# Patient Record
Sex: Male | Born: 1945
Health system: Southern US, Community
[De-identification: ages and names within clinical notes are randomized; demographics above are authoritative.]

## PROBLEM LIST (undated history)

## (undated) DIAGNOSIS — I480 Paroxysmal atrial fibrillation: Secondary | ICD-10-CM

## (undated) DIAGNOSIS — E291 Testicular hypofunction: Secondary | ICD-10-CM

## (undated) DIAGNOSIS — R350 Frequency of micturition: Secondary | ICD-10-CM

## (undated) DIAGNOSIS — I213 ST elevation (STEMI) myocardial infarction of unspecified site: Secondary | ICD-10-CM

## (undated) DIAGNOSIS — E785 Hyperlipidemia, unspecified: Secondary | ICD-10-CM

## (undated) DIAGNOSIS — IMO0001 Reserved for inherently not codable concepts without codable children: Secondary | ICD-10-CM

## (undated) DIAGNOSIS — N401 Enlarged prostate with lower urinary tract symptoms: Secondary | ICD-10-CM

## (undated) DIAGNOSIS — F419 Anxiety disorder, unspecified: Secondary | ICD-10-CM

## (undated) DIAGNOSIS — J449 Chronic obstructive pulmonary disease, unspecified: Secondary | ICD-10-CM

## (undated) DIAGNOSIS — J189 Pneumonia, unspecified organism: Secondary | ICD-10-CM

## (undated) DIAGNOSIS — K219 Gastro-esophageal reflux disease without esophagitis: Secondary | ICD-10-CM

## (undated) DIAGNOSIS — R7989 Other specified abnormal findings of blood chemistry: Secondary | ICD-10-CM

## (undated) DIAGNOSIS — I5021 Acute systolic (congestive) heart failure: Secondary | ICD-10-CM

## (undated) DIAGNOSIS — R911 Solitary pulmonary nodule: Secondary | ICD-10-CM

## (undated) DIAGNOSIS — M5430 Sciatica, unspecified side: Secondary | ICD-10-CM

## (undated) DIAGNOSIS — J869 Pyothorax without fistula: Secondary | ICD-10-CM

## (undated) DIAGNOSIS — I10 Essential (primary) hypertension: Secondary | ICD-10-CM

## (undated) HISTORY — DX: Benign prostatic hyperplasia with lower urinary tract symptoms: N40.1

## (undated) HISTORY — PX: BACK SURGERY: SHX140

## (undated) HISTORY — PX: HERNIA REPAIR: SHX51

## (undated) HISTORY — DX: Testicular hypofunction: E29.1

## (undated) HISTORY — DX: Chronic obstructive pulmonary disease, unspecified: J44.9

## (undated) HISTORY — DX: Sciatica, unspecified side: M54.30

## (undated) HISTORY — DX: Solitary pulmonary nodule: R91.1

## (undated) HISTORY — PX: VARICOSE VEIN SURGERY: SHX832

## (undated) HISTORY — PX: APPENDECTOMY: SHX54

## (undated) HISTORY — DX: Pyothorax without fistula: J86.9

---

## 2009-03-21 HISTORY — PX: COLONOSCOPY: SHX174

## 2011-06-13 DIAGNOSIS — H903 Sensorineural hearing loss, bilateral: Secondary | ICD-10-CM | POA: Diagnosis not present

## 2011-06-13 DIAGNOSIS — H905 Unspecified sensorineural hearing loss: Secondary | ICD-10-CM | POA: Diagnosis not present

## 2011-06-13 DIAGNOSIS — H938X9 Other specified disorders of ear, unspecified ear: Secondary | ICD-10-CM | POA: Diagnosis not present

## 2011-06-13 DIAGNOSIS — Z822 Family history of deafness and hearing loss: Secondary | ICD-10-CM | POA: Diagnosis not present

## 2011-06-13 DIAGNOSIS — H93299 Other abnormal auditory perceptions, unspecified ear: Secondary | ICD-10-CM | POA: Diagnosis not present

## 2011-06-28 DIAGNOSIS — H2510 Age-related nuclear cataract, unspecified eye: Secondary | ICD-10-CM | POA: Diagnosis not present

## 2011-07-06 DIAGNOSIS — H903 Sensorineural hearing loss, bilateral: Secondary | ICD-10-CM | POA: Diagnosis not present

## 2011-07-06 DIAGNOSIS — H905 Unspecified sensorineural hearing loss: Secondary | ICD-10-CM | POA: Diagnosis not present

## 2011-08-02 DIAGNOSIS — N529 Male erectile dysfunction, unspecified: Secondary | ICD-10-CM | POA: Diagnosis not present

## 2011-08-02 DIAGNOSIS — I10 Essential (primary) hypertension: Secondary | ICD-10-CM | POA: Diagnosis not present

## 2011-08-02 DIAGNOSIS — E291 Testicular hypofunction: Secondary | ICD-10-CM | POA: Diagnosis not present

## 2011-08-30 DIAGNOSIS — N529 Male erectile dysfunction, unspecified: Secondary | ICD-10-CM | POA: Diagnosis not present

## 2011-08-30 DIAGNOSIS — E291 Testicular hypofunction: Secondary | ICD-10-CM | POA: Diagnosis not present

## 2011-11-08 DIAGNOSIS — Z23 Encounter for immunization: Secondary | ICD-10-CM | POA: Diagnosis not present

## 2011-12-29 DIAGNOSIS — N529 Male erectile dysfunction, unspecified: Secondary | ICD-10-CM | POA: Diagnosis not present

## 2012-01-30 DIAGNOSIS — I10 Essential (primary) hypertension: Secondary | ICD-10-CM | POA: Diagnosis not present

## 2012-01-30 DIAGNOSIS — G4700 Insomnia, unspecified: Secondary | ICD-10-CM | POA: Diagnosis not present

## 2012-03-12 DIAGNOSIS — F411 Generalized anxiety disorder: Secondary | ICD-10-CM | POA: Diagnosis not present

## 2012-03-12 DIAGNOSIS — Z23 Encounter for immunization: Secondary | ICD-10-CM | POA: Diagnosis not present

## 2012-03-12 DIAGNOSIS — E291 Testicular hypofunction: Secondary | ICD-10-CM | POA: Diagnosis not present

## 2012-03-12 DIAGNOSIS — G4700 Insomnia, unspecified: Secondary | ICD-10-CM | POA: Diagnosis not present

## 2012-03-12 DIAGNOSIS — I10 Essential (primary) hypertension: Secondary | ICD-10-CM | POA: Diagnosis not present

## 2012-03-13 DIAGNOSIS — I10 Essential (primary) hypertension: Secondary | ICD-10-CM | POA: Diagnosis not present

## 2012-03-13 DIAGNOSIS — F411 Generalized anxiety disorder: Secondary | ICD-10-CM | POA: Diagnosis not present

## 2012-03-13 DIAGNOSIS — Z125 Encounter for screening for malignant neoplasm of prostate: Secondary | ICD-10-CM | POA: Diagnosis not present

## 2012-03-30 DIAGNOSIS — N529 Male erectile dysfunction, unspecified: Secondary | ICD-10-CM | POA: Diagnosis not present

## 2012-03-30 DIAGNOSIS — E291 Testicular hypofunction: Secondary | ICD-10-CM | POA: Diagnosis not present

## 2012-09-11 DIAGNOSIS — E291 Testicular hypofunction: Secondary | ICD-10-CM | POA: Diagnosis not present

## 2012-09-11 DIAGNOSIS — J449 Chronic obstructive pulmonary disease, unspecified: Secondary | ICD-10-CM | POA: Diagnosis not present

## 2012-09-11 DIAGNOSIS — I10 Essential (primary) hypertension: Secondary | ICD-10-CM | POA: Diagnosis not present

## 2012-11-07 DIAGNOSIS — H4011X0 Primary open-angle glaucoma, stage unspecified: Secondary | ICD-10-CM | POA: Diagnosis not present

## 2012-12-05 DIAGNOSIS — Z23 Encounter for immunization: Secondary | ICD-10-CM | POA: Diagnosis not present

## 2013-03-13 DIAGNOSIS — J4489 Other specified chronic obstructive pulmonary disease: Secondary | ICD-10-CM | POA: Diagnosis not present

## 2013-03-13 DIAGNOSIS — I10 Essential (primary) hypertension: Secondary | ICD-10-CM | POA: Diagnosis not present

## 2013-03-13 DIAGNOSIS — N401 Enlarged prostate with lower urinary tract symptoms: Secondary | ICD-10-CM | POA: Diagnosis not present

## 2013-03-13 DIAGNOSIS — N138 Other obstructive and reflux uropathy: Secondary | ICD-10-CM | POA: Diagnosis not present

## 2013-03-13 DIAGNOSIS — J449 Chronic obstructive pulmonary disease, unspecified: Secondary | ICD-10-CM | POA: Diagnosis not present

## 2013-03-26 DIAGNOSIS — E875 Hyperkalemia: Secondary | ICD-10-CM | POA: Diagnosis not present

## 2013-08-08 DIAGNOSIS — Z23 Encounter for immunization: Secondary | ICD-10-CM | POA: Diagnosis not present

## 2013-08-08 DIAGNOSIS — J449 Chronic obstructive pulmonary disease, unspecified: Secondary | ICD-10-CM | POA: Diagnosis not present

## 2013-08-08 DIAGNOSIS — I10 Essential (primary) hypertension: Secondary | ICD-10-CM | POA: Diagnosis not present

## 2013-08-08 DIAGNOSIS — E291 Testicular hypofunction: Secondary | ICD-10-CM | POA: Diagnosis not present

## 2013-08-23 DIAGNOSIS — N529 Male erectile dysfunction, unspecified: Secondary | ICD-10-CM | POA: Diagnosis not present

## 2013-11-16 DIAGNOSIS — Z23 Encounter for immunization: Secondary | ICD-10-CM | POA: Diagnosis not present

## 2014-01-28 DIAGNOSIS — L03115 Cellulitis of right lower limb: Secondary | ICD-10-CM | POA: Diagnosis not present

## 2014-02-06 DIAGNOSIS — J449 Chronic obstructive pulmonary disease, unspecified: Secondary | ICD-10-CM | POA: Diagnosis not present

## 2014-02-06 DIAGNOSIS — N401 Enlarged prostate with lower urinary tract symptoms: Secondary | ICD-10-CM | POA: Diagnosis not present

## 2014-02-06 DIAGNOSIS — I10 Essential (primary) hypertension: Secondary | ICD-10-CM | POA: Diagnosis not present

## 2014-02-06 DIAGNOSIS — L03115 Cellulitis of right lower limb: Secondary | ICD-10-CM | POA: Diagnosis not present

## 2014-02-18 DIAGNOSIS — B353 Tinea pedis: Secondary | ICD-10-CM | POA: Diagnosis not present

## 2014-02-18 DIAGNOSIS — B351 Tinea unguium: Secondary | ICD-10-CM | POA: Diagnosis not present

## 2014-02-18 DIAGNOSIS — M2012 Hallux valgus (acquired), left foot: Secondary | ICD-10-CM | POA: Diagnosis not present

## 2014-02-27 DIAGNOSIS — H5201 Hypermetropia, right eye: Secondary | ICD-10-CM | POA: Diagnosis not present

## 2014-02-27 DIAGNOSIS — H04129 Dry eye syndrome of unspecified lacrimal gland: Secondary | ICD-10-CM | POA: Diagnosis not present

## 2014-02-27 DIAGNOSIS — H52223 Regular astigmatism, bilateral: Secondary | ICD-10-CM | POA: Diagnosis not present

## 2014-02-27 DIAGNOSIS — H524 Presbyopia: Secondary | ICD-10-CM | POA: Diagnosis not present

## 2014-03-19 DIAGNOSIS — I10 Essential (primary) hypertension: Secondary | ICD-10-CM | POA: Diagnosis not present

## 2014-03-19 DIAGNOSIS — N401 Enlarged prostate with lower urinary tract symptoms: Secondary | ICD-10-CM | POA: Diagnosis not present

## 2014-03-19 DIAGNOSIS — J449 Chronic obstructive pulmonary disease, unspecified: Secondary | ICD-10-CM | POA: Diagnosis not present

## 2014-03-21 HISTORY — PX: LUNG SURGERY: SHX703

## 2014-05-08 DIAGNOSIS — J449 Chronic obstructive pulmonary disease, unspecified: Secondary | ICD-10-CM | POA: Diagnosis not present

## 2014-05-08 DIAGNOSIS — N401 Enlarged prostate with lower urinary tract symptoms: Secondary | ICD-10-CM | POA: Diagnosis not present

## 2014-05-08 DIAGNOSIS — I10 Essential (primary) hypertension: Secondary | ICD-10-CM | POA: Diagnosis not present

## 2014-05-15 DIAGNOSIS — R413 Other amnesia: Secondary | ICD-10-CM | POA: Diagnosis not present

## 2014-05-15 LAB — VITAMIN B12: Vitamin B-12: 515

## 2014-05-20 DIAGNOSIS — M2012 Hallux valgus (acquired), left foot: Secondary | ICD-10-CM | POA: Diagnosis not present

## 2014-05-20 DIAGNOSIS — J189 Pneumonia, unspecified organism: Secondary | ICD-10-CM

## 2014-05-20 DIAGNOSIS — B353 Tinea pedis: Secondary | ICD-10-CM | POA: Diagnosis not present

## 2014-05-20 DIAGNOSIS — B351 Tinea unguium: Secondary | ICD-10-CM | POA: Diagnosis not present

## 2014-05-20 HISTORY — DX: Pneumonia, unspecified organism: J18.9

## 2014-05-29 ENCOUNTER — Telehealth: Payer: Self-pay

## 2014-05-29 NOTE — Telephone Encounter (Signed)
PATIENT WIFE, PATRICIA Neubecker,  CALLED TO SCHEDULE HUSBANDS COLONOSCOPY, PLEASE CALL [phone]  LEAVE MESSAGE IF NO ANSWER.  WIFE WILL ALSO NEED TO BE SCHEDULED FOR ONE.

## 2014-06-04 NOTE — Telephone Encounter (Signed)
Per pt's info from Dr. Derald Macleod, his last colonoscopy was done 02/18/2010 and next due in 5 years. He had tubular adenoma and will need OV. However, wife said he was hoping to do in May. She will have him check with Medicare, because they might not pay if he is not due til 02/2015.

## 2014-06-07 ENCOUNTER — Emergency Department (HOSPITAL_COMMUNITY): Payer: Medicare Other

## 2014-06-07 ENCOUNTER — Encounter (HOSPITAL_COMMUNITY): Payer: Self-pay | Admitting: Emergency Medicine

## 2014-06-07 ENCOUNTER — Inpatient Hospital Stay (HOSPITAL_COMMUNITY)
Admission: EM | Admit: 2014-06-07 | Discharge: 2014-06-10 | DRG: 193 | Disposition: A | Discharge status: Home / Self Care | Payer: Medicare Other | Attending: Pulmonary Disease | Admitting: Pulmonary Disease

## 2014-06-07 DIAGNOSIS — R109 Unspecified abdominal pain: Secondary | ICD-10-CM | POA: Diagnosis not present

## 2014-06-07 DIAGNOSIS — R110 Nausea: Secondary | ICD-10-CM | POA: Diagnosis not present

## 2014-06-07 DIAGNOSIS — J189 Pneumonia, unspecified organism: Secondary | ICD-10-CM | POA: Diagnosis not present

## 2014-06-07 DIAGNOSIS — I10 Essential (primary) hypertension: Secondary | ICD-10-CM | POA: Diagnosis present

## 2014-06-07 DIAGNOSIS — Z7982 Long term (current) use of aspirin: Secondary | ICD-10-CM | POA: Diagnosis not present

## 2014-06-07 DIAGNOSIS — N400 Enlarged prostate without lower urinary tract symptoms: Secondary | ICD-10-CM | POA: Diagnosis present

## 2014-06-07 DIAGNOSIS — Z8249 Family history of ischemic heart disease and other diseases of the circulatory system: Secondary | ICD-10-CM | POA: Diagnosis not present

## 2014-06-07 DIAGNOSIS — R6510 Systemic inflammatory response syndrome (SIRS) of non-infectious origin without acute organ dysfunction: Secondary | ICD-10-CM

## 2014-06-07 DIAGNOSIS — E860 Dehydration: Secondary | ICD-10-CM | POA: Diagnosis present

## 2014-06-07 DIAGNOSIS — F17290 Nicotine dependence, other tobacco product, uncomplicated: Secondary | ICD-10-CM | POA: Diagnosis present

## 2014-06-07 DIAGNOSIS — N179 Acute kidney failure, unspecified: Secondary | ICD-10-CM | POA: Diagnosis not present

## 2014-06-07 DIAGNOSIS — R0602 Shortness of breath: Secondary | ICD-10-CM | POA: Diagnosis not present

## 2014-06-07 DIAGNOSIS — J9601 Acute respiratory failure with hypoxia: Secondary | ICD-10-CM

## 2014-06-07 DIAGNOSIS — A419 Sepsis, unspecified organism: Secondary | ICD-10-CM | POA: Diagnosis not present

## 2014-06-07 DIAGNOSIS — Z66 Do not resuscitate: Secondary | ICD-10-CM | POA: Diagnosis present

## 2014-06-07 DIAGNOSIS — Z825 Family history of asthma and other chronic lower respiratory diseases: Secondary | ICD-10-CM | POA: Diagnosis not present

## 2014-06-07 DIAGNOSIS — R112 Nausea with vomiting, unspecified: Secondary | ICD-10-CM | POA: Diagnosis not present

## 2014-06-07 DIAGNOSIS — J449 Chronic obstructive pulmonary disease, unspecified: Secondary | ICD-10-CM | POA: Diagnosis present

## 2014-06-07 DIAGNOSIS — M549 Dorsalgia, unspecified: Secondary | ICD-10-CM | POA: Diagnosis not present

## 2014-06-07 DIAGNOSIS — J90 Pleural effusion, not elsewhere classified: Secondary | ICD-10-CM | POA: Diagnosis not present

## 2014-06-07 HISTORY — DX: Other specified abnormal findings of blood chemistry: R79.89

## 2014-06-07 HISTORY — DX: Essential (primary) hypertension: I10

## 2014-06-07 HISTORY — DX: Frequency of micturition: R35.0

## 2014-06-07 LAB — COMPREHENSIVE METABOLIC PANEL
ALK PHOS: 43 U/L (ref 39–117)
ALT: 14 U/L (ref 0–53)
ANION GAP: 9 (ref 5–15)
AST: 13 U/L (ref 0–37)
Albumin: 3.9 g/dL (ref 3.5–5.2)
BUN: 28 mg/dL — ABNORMAL HIGH (ref 6–23)
CALCIUM: 8.7 mg/dL (ref 8.4–10.5)
CO2: 26 mmol/L (ref 19–32)
CREATININE: 1.4 mg/dL — AB (ref 0.50–1.35)
Chloride: 103 mmol/L (ref 96–112)
GFR calc Af Amer: 58 mL/min — ABNORMAL LOW (ref >90)
GFR, EST NON AFRICAN AMERICAN: 50 mL/min — AB (ref >90)
Glucose, Bld: 114 mg/dL — ABNORMAL HIGH (ref 70–99)
Potassium: 4.3 mmol/L (ref 3.5–5.1)
Sodium: 138 mmol/L (ref 135–145)
Total Bilirubin: 0.8 mg/dL (ref 0.3–1.2)
Total Protein: 7.1 g/dL (ref 6.0–8.3)

## 2014-06-07 LAB — CBC WITH DIFFERENTIAL/PLATELET
Basophils Absolute: 0 10*3/uL (ref 0.0–0.1)
Basophils Relative: 0 % (ref 0–1)
EOS ABS: 0 10*3/uL (ref 0.0–0.7)
Eosinophils Relative: 0 % (ref 0–5)
HCT: 42.5 % (ref 39.0–52.0)
Hemoglobin: 14.5 g/dL (ref 13.0–17.0)
LYMPHS PCT: 4 % — AB (ref 12–46)
Lymphs Abs: 0.6 10*3/uL — ABNORMAL LOW (ref 0.7–4.0)
MCH: 32.2 pg (ref 26.0–34.0)
MCHC: 34.1 g/dL (ref 30.0–36.0)
MCV: 94.4 fL (ref 78.0–100.0)
MONOS PCT: 8 % (ref 3–12)
Monocytes Absolute: 1.4 10*3/uL — ABNORMAL HIGH (ref 0.1–1.0)
Neutro Abs: 14.5 10*3/uL — ABNORMAL HIGH (ref 1.7–7.7)
Neutrophils Relative %: 88 % — ABNORMAL HIGH (ref 43–77)
Platelets: 228 10*3/uL (ref 150–400)
RBC: 4.5 MIL/uL (ref 4.22–5.81)
RDW: 13 % (ref 11.5–15.5)
WBC: 16.6 10*3/uL — AB (ref 4.0–10.5)

## 2014-06-07 LAB — TROPONIN I: Troponin I: <0.03 ng/mL (ref <0.031)

## 2014-06-07 LAB — URINALYSIS, ROUTINE W REFLEX MICROSCOPIC
BILIRUBIN URINE: NEGATIVE
Glucose, UA: NEGATIVE mg/dL
Hgb urine dipstick: NEGATIVE
Leukocytes, UA: NEGATIVE
Nitrite: NEGATIVE
PROTEIN: NEGATIVE mg/dL
Specific Gravity, Urine: 1.025 (ref 1.005–1.030)
Urobilinogen, UA: 0.2 mg/dL (ref 0.0–1.0)
pH: 5.5 (ref 5.0–8.0)

## 2014-06-07 LAB — CBG MONITORING, ED: Glucose-Capillary: 114 mg/dL — ABNORMAL HIGH (ref 70–99)

## 2014-06-07 LAB — I-STAT CG4 LACTIC ACID, ED
LACTIC ACID, VENOUS: 0.76 mmol/L (ref 0.5–2.0)
LACTIC ACID, VENOUS: 0.94 mmol/L (ref 0.5–2.0)

## 2014-06-07 LAB — LIPASE, BLOOD: LIPASE: 23 U/L (ref 11–59)

## 2014-06-07 MED ORDER — TESTOSTERONE 50 MG/5GM (1%) TD GEL
5.0000 g | Freq: Every day | TRANSDERMAL | Status: DC
  Administered 2014-06-09: 5 g via TRANSDERMAL

## 2014-06-07 MED ORDER — IOHEXOL 300 MG/ML  SOLN
50.0000 mL | Freq: Once | INTRAMUSCULAR | Status: AC | PRN
  Administered 2014-06-07: 50 mL via ORAL

## 2014-06-07 MED ORDER — LISINOPRIL 10 MG PO TABS
20.0000 mg | ORAL_TABLET | Freq: Every day | ORAL | Status: DC
  Administered 2014-06-08 – 2014-06-10 (×3): 20 mg via ORAL
  Filled 2014-06-07 (×3): qty 2

## 2014-06-07 MED ORDER — HYDROMORPHONE HCL 1 MG/ML IJ SOLN: 1.0000 mg | INTRAMUSCULAR | Status: DC | PRN

## 2014-06-07 MED ORDER — SODIUM CHLORIDE 0.9 % IV SOLN
INTRAVENOUS | Status: DC
  Administered 2014-06-07: 125 mL/h via INTRAVENOUS
  Administered 2014-06-08 – 2014-06-10 (×5): via INTRAVENOUS

## 2014-06-07 MED ORDER — IOHEXOL 300 MG/ML  SOLN
100.0000 mL | Freq: Once | INTRAMUSCULAR | Status: AC | PRN
  Administered 2014-06-07: 100 mL via INTRAVENOUS

## 2014-06-07 MED ORDER — ONDANSETRON HCL 4 MG/2ML IJ SOLN: 4.0000 mg | Freq: Four times a day (QID) | INTRAMUSCULAR | Status: AC | PRN

## 2014-06-07 MED ORDER — HYDROMORPHONE HCL 1 MG/ML IJ SOLN
0.5000 mg | INTRAMUSCULAR | Status: DC | PRN
  Administered 2014-06-07: 0.5 mg via INTRAVENOUS
  Filled 2014-06-07: qty 1

## 2014-06-07 MED ORDER — ASPIRIN EC 81 MG PO TBEC
81.0000 mg | DELAYED_RELEASE_TABLET | Freq: Every day | ORAL | Status: DC
  Administered 2014-06-08 – 2014-06-10 (×3): 81 mg via ORAL
  Filled 2014-06-07 (×3): qty 1

## 2014-06-07 MED ORDER — PIPERACILLIN-TAZOBACTAM 3.375 G IVPB 30 MIN
3.3750 g | Freq: Once | INTRAVENOUS | Status: AC
  Administered 2014-06-07: 3.375 g via INTRAVENOUS
  Filled 2014-06-07: qty 50

## 2014-06-07 MED ORDER — LEVOFLOXACIN IN D5W 750 MG/150ML IV SOLN
750.0000 mg | INTRAVENOUS | Status: DC
  Administered 2014-06-07 – 2014-06-09 (×3): 750 mg via INTRAVENOUS
  Filled 2014-06-07 (×3): qty 150

## 2014-06-07 MED ORDER — SODIUM CHLORIDE 0.9 % IV BOLUS (SEPSIS)
1000.0000 mL | Freq: Once | INTRAVENOUS | Status: AC
  Administered 2014-06-07: 1000 mL via INTRAVENOUS

## 2014-06-07 MED ORDER — OXYCODONE HCL 5 MG PO TABS
5.0000 mg | ORAL_TABLET | ORAL | Status: DC | PRN
  Administered 2014-06-07 – 2014-06-08 (×2): 5 mg via ORAL
  Filled 2014-06-07 (×2): qty 1

## 2014-06-07 MED ORDER — TAMSULOSIN HCL 0.4 MG PO CAPS
0.4000 mg | ORAL_CAPSULE | Freq: Every day | ORAL | Status: DC
  Administered 2014-06-08 – 2014-06-10 (×3): 0.4 mg via ORAL
  Filled 2014-06-07 (×3): qty 1

## 2014-06-07 MED ORDER — ENOXAPARIN SODIUM 40 MG/0.4ML ~~LOC~~ SOLN
40.0000 mg | SUBCUTANEOUS | Status: DC
  Administered 2014-06-07 – 2014-06-09 (×3): 40 mg via SUBCUTANEOUS
  Filled 2014-06-07 (×3): qty 0.4

## 2014-06-07 MED ORDER — METHYLPREDNISOLONE SODIUM SUCC 125 MG IJ SOLR
60.0000 mg | Freq: Every day | INTRAMUSCULAR | Status: DC
  Administered 2014-06-07 – 2014-06-10 (×4): 60 mg via INTRAVENOUS
  Filled 2014-06-07 (×4): qty 2

## 2014-06-07 MED ORDER — AMLODIPINE BESYLATE 5 MG PO TABS
2.5000 mg | ORAL_TABLET | Freq: Every day | ORAL | Status: DC
  Administered 2014-06-08 – 2014-06-10 (×3): 2.5 mg via ORAL
  Filled 2014-06-07 (×3): qty 1

## 2014-06-07 NOTE — H&P (Signed)
History and Physical  Cru Kritikos NWG:[id number] DOB: Jul 14, 1945 DOA: 06/07/2014  Referring physician: Dr. Sabra Heck, ED physician PCP: Alonza Bogus, MD   Chief Complaint: Right flank pain  HPI: [name] [name] is a 69 y.o. male  With a history of hypertension, BPH, low serum testosterone level, mild COPD. Patient presented to the onset of right flank pain that started 4-5 days ago and has gradually increased. The patient had sudden worsening of his pain this afternoon. His pain had become sharp, 8 out of 10, and radiated into his right shoulder. The patient's wife called EMS the patient was transported here. The patient has been feeling feverish but no oral temperatures have been obtained. No provoking or palliating factors. Shortness of breath worse with exertion.   Review of Systems:   Pt complains of pleurisy, back spasms, shortness of breath, cough.  Pt denies any chills, and nausea, vomiting, anorexia, chest pain, palpitations, wheezing, diarrhea, constipation.  Review of systems are otherwise negative  Past Medical History  Diagnosis Date  . Hypertension   . Urinary frequency   . Low serum testosterone level    Past Surgical History  Procedure Laterality Date  . Hernia repair    . Appendectomy     Social History:  reports that he has been smoking Pipe.  He does not have any smokeless tobacco history on file. He reports that he drinks alcohol. He reports that he does not use illicit drugs. Patient lives at home & is able to participate in activities of daily living  No Known Allergies  Family History  Problem Relation Age of Onset  . Tuberculosis Father   . Hypertension Father   . COPD Mother   . Hypertension Mother      Prior to Admission medications   Medication Sig Start Date End Date Taking? Authorizing Provider  amLODipine (NORVASC) 2.5 MG tablet Take 1 tablet by mouth daily. 05/20/14  Yes Historical Provider, MD  aspirin EC 81 MG tablet Take 81 mg by mouth  daily.   Yes Historical Provider, MD  Calcium Carbonate-Vitamin D (CALCIUM + D PO) Take 1 tablet by mouth daily.   Yes Historical Provider, MD  Glucosamine-Chondroitin-Vit D3 1500-1200-800 MG-MG-UNIT PACK Take 1 tablet by mouth daily.   Yes Historical Provider, MD  Lactobacillus (ACIDOPHILUS PROBIOTIC) 10 MG TABS Take 1 tablet by mouth daily.   Yes Historical Provider, MD  lisinopril (PRINIVIL,ZESTRIL) 20 MG tablet Take 1 tablet by mouth daily. 05/20/14  Yes Historical Provider, MD  Multiple Vitamin (MULTIVITAMIN WITH MINERALS) TABS tablet Take 1 tablet by mouth daily.   Yes Historical Provider, MD  Multiple Vitamins-Minerals (PRESERVISION AREDS 2 PO) Take 2 tablets by mouth daily.   Yes Historical Provider, MD  Omega Fatty Acids-Phytosterols (PA FISH OIL/PHYTOSTEROLS) 450-400 MG CAPS Take 1 capsule by mouth daily.   Yes Historical Provider, MD  Omega-3 Fatty Acids (FISH OIL) 1200 MG CAPS Take 1 capsule by mouth daily.   Yes Historical Provider, MD  tamsulosin (FLOMAX) 0.4 MG CAPS capsule Take 1 capsule by mouth daily. 05/30/14  Yes Historical Provider, MD  testosterone (ANDROGEL) 50 MG/5GM (1%) GEL Place 5 g onto the skin daily. 05/21/14  Yes Historical Provider, MD    Physical Exam: BP 134/84 mmHg  Pulse 98  Temp(Src) 99.7 F (37.6 C) (Oral)  Resp 26  Ht 5\' 11"  (1.803 m)  Wt 88.451 kg (195 lb)  BMI 27.21 kg/m2  SpO2 94%  General: Elderly Caucasian male. Awake and alert and oriented x3. No  acute cardiopulmonary distress.  Eyes: Pupils equal, round, reactive to light. Extraocular muscles are intact. Sclerae anicteric and noninjected.  ENT:  Dry mucosal membranes. No mucosal lesions.  Neck: Neck supple without lymphadenopathy. No carotid bruits. No masses palpated.  Cardiovascular: Regular rate with normal S1-S2 sounds. No murmurs, rubs, gallops auscultated. No JVD.  Respiratory: Good respiratory effort with no wheezes. Rales in the right mid to lower lung fields. Egophony present. Abdomen:  Soft, nontender, nondistended. Active bowel sounds. No masses or hepatosplenomegaly  Skin: Dry, warm to touch. 2+ dorsalis pedis and radial pulses. Musculoskeletal: No calf or leg pain. All major joints not erythematous nontender.  Psychiatric: Intact judgment and insight.  Neurologic: No focal neurological deficits. Cranial nerves II through XII are grossly intact.           Labs on Admission:  Basic Metabolic Panel:  Recent Labs Lab 06/07/14 1805  NA 138  K 4.3  CL 103  CO2 26  GLUCOSE 114*  BUN 28*  CREATININE 1.40*  CALCIUM 8.7   Liver Function Tests:  Recent Labs Lab 06/07/14 1805  AST 13  ALT 14  ALKPHOS 43  BILITOT 0.8  PROT 7.1  ALBUMIN 3.9    Recent Labs Lab 06/07/14 1805  LIPASE 23   No results for input(s): AMMONIA in the last 168 hours. CBC:  Recent Labs Lab 06/07/14 1805  WBC 16.6*  NEUTROABS 14.5*  HGB 14.5  HCT 42.5  MCV 94.4  PLT 228   Cardiac Enzymes:  Recent Labs Lab 06/07/14 1805  TROPONINI <0.03    BNP (last 3 results) No results for input(s): BNP in the last 8760 hours.  ProBNP (last 3 results) No results for input(s): PROBNP in the last 8760 hours.  CBG:  Recent Labs Lab 06/07/14 1715  GLUCAP 114*    Radiological Exams on Admission: Ct Abdomen Pelvis W Contrast  06/07/2014   CLINICAL DATA:  Right flank pain, generalized body aches, fever, shortness of breath  EXAM: CT ABDOMEN AND PELVIS WITH CONTRAST  TECHNIQUE: Multidetector CT imaging of the abdomen and pelvis was performed using the standard protocol following bolus administration of intravenous contrast.  CONTRAST:  158mL OMNIPAQUE IOHEXOL 300 MG/ML  SOLN  COMPARISON:  None.  FINDINGS: Lower chest: Patchy right middle and lower lobe opacities, suspicious for pneumonia. Associated small right pleural effusion.  Hepatobiliary: 11 mm probable cyst in the medial segment left hepatic lobe (series 2/ image 23).  Gallbladder is unremarkable. No intrahepatic or  extrahepatic ductal dilatation.  Pancreas: Within normal limits.  Spleen: Within normal limits.  Adrenals/Urinary Tract: Adrenal glands are within normal limits.  16 mm cyst in the lateral interpolar left kidney (series 2/ image 29). Right kidney is within normal limits. No hydronephrosis.  Bladder is within normal limits.  Stomach/Bowel: Stomach is within normal limits.  No evidence of bowel obstruction.  Prior appendectomy.  Mild colonic diverticulosis, without evidence of diverticulitis.  Vascular/Lymphatic: Atherosclerotic calcifications of the abdominal aorta and branch vessels.  No suspicious abdominopelvic lymphadenopathy.  Reproductive: Prostate is notable for dystrophic calcifications.  Other: No abdominopelvic ascites.  Postsurgical changes related to prior right inguinal hernia repair.  Musculoskeletal: Degenerative changes of the visualized thoracolumbar spine.  IMPRESSION: Patchy right middle and lower lobe opacities, suspicious for pneumonia. Associated small right pleural effusion.  No evidence of bowel obstruction.  Prior appendectomy.   Electronically Signed   By: Julian Hy M.D.   On: 06/07/2014 20:23   Dg Chest Port 1 View  06/07/2014  CLINICAL DATA:  PATIENT ARRIEVED TO ER BY EMS, RIGHT SIDE/FLANK PAIN STARTED TODAY AND SOB TODAY. BODY ACHES X 1 WEEK. LEFT SHOULDER PAIN, NAUSEA YESTERDAY. PATIENT STATES " HURTS TO BREATHE" STATES " HE SMOKES A PIPE" HISTORY OF HTN  EXAM: PORTABLE CHEST - 1 VIEW  COMPARISON:  None  FINDINGS: There is basilar opacity, most evident on right, which may all be atelectasis. Pneumonia is possible. There is no evidence of pulmonary edema. No pleural effusion or pneumothorax.  Cardiac silhouette is normal in size. No mediastinal or hilar masses. Bony thorax is demineralized but grossly intact.  IMPRESSION: Right greater than left lung base opacity. Atelectasis/scarring suspected. Infiltrate is possible. No pulmonary edema.   Electronically Signed   By: Lajean Manes M.D.   On: 06/07/2014 18:18    EKG: Independently reviewed. Sinus rhythm. Normal intervals. Inverted P wave in V1 suggestive of left atrial hypertrophy. No ST changes  Assessment/Plan Present on Admission:  . CAP (community acquired pneumonia) . Acute respiratory failure with hypoxia . Acute renal injury . Dehydration  #1 acute respiratory failure with hypoxia #2 community-acquired pneumonia #3 acute renal injury #4 dehydration #5 hypertension #6 BPH  Admit the patient to MedSurg to Dr. Doristine Section change the patient to Levaquin We'll start the patient on Solu-Medrol as there is evidence supporting the use of glucocorticoids in hospitalized community-acquired pneumonia patient's Continue IV fluids for rehydration and acute renal injury secondary to dehydration Continue home medications  DVT prophylaxis: Lovenox  Consultants: None  Code Status: DO NOT RESUSCITATE  Family Communication: Wife in the room   Disposition Plan: Home following improvement   Truett Mainland, DO [facility] Pager [phone]

## 2014-06-07 NOTE — ED Notes (Signed)
Patient arrives via EMS from home with c/o right side/flank pain that started today. Wife states patient c/o generalized aching and body aches x 1 week. Patient states left shoulder pain yesterday. + Nausea. Patient given 4 mg zofran and 50 mcg of Fentanyl in route to ED.

## 2014-06-07 NOTE — Progress Notes (Signed)
ANTIBIOTIC CONSULT NOTE - INITIAL  Pharmacy Consult for Renal Adjustment Antibiotics (Levaquin) Indication: pneumonia  No Known Allergies  Patient Measurements: Height: 5\' 11"  (180.3 cm) Weight: 195 lb (88.451 kg) IBW/kg (Calculated) : 75.3 Adjusted Body Weight:   Vital Signs: Temp: 99.7 F (37.6 C) (03/19 1711) Temp Source: Oral (03/19 1711) BP: 134/84 mmHg (03/19 2100) Pulse Rate: 98 (03/19 2030) Intake/Output from previous day:   Intake/Output from this shift:    Labs:  Recent Labs  06/07/14 1805  WBC 16.6*  HGB 14.5  PLT 228  CREATININE 1.40*   Estimated Creatinine Clearance: 53 mL/min (by C-G formula based on Cr of 1.4). No results for input(s): VANCOTROUGH, VANCOPEAK, VANCORANDOM, GENTTROUGH, GENTPEAK, GENTRANDOM, TOBRATROUGH, TOBRAPEAK, TOBRARND, AMIKACINPEAK, AMIKACINTROU, AMIKACIN in the last 72 hours.   Microbiology: No results found for this or any previous visit (from the past 720 hour(s)).  Medical History: Past Medical History  Diagnosis Date  . Hypertension   . Urinary frequency   . Low serum testosterone level     Medications:   (Not in a hospital admission) Assessment: ED patient admitted for CAP Levaquin 750 mg IV every 24 hours on admission CrCl 53 ml/min  Goal of Therapy:  Eradicate infection  Plan:  Continue Levaquin 750 mg IV every 24 hours Monitor renal function F/U oral therapy when appropriate  [name] [name], [name] [name] 06/07/2014,9:31 PM

## 2014-06-07 NOTE — ED Provider Notes (Signed)
CSN: [id number]     Arrival date & time 06/07/14  1651 History   First MD Initiated Contact with Patient 06/07/14 1707     Chief Complaint  Patient presents with  . Flank Pain     (Consider location/radiation/quality/duration/timing/severity/associated sxs/prior Treatment) HPI Comments: The patient is a 69 year old male, he has a history of hypertension, appendectomy, hernia repair, presents with a complaint of pain which is currently in his right side including his abdomen chest shoulder and neck, had had previous left-sided aching approximately 2 days ago, this has been progressive, gradually worsening and is now severe. He called paramedics, they found him doubled over on the bed in severe pain, gave fentanyl with significant amount of relief. They found him to be tachycardic but otherwise had normal blood pressure is not slightly hypertensive. The patient denies nausea or vomiting, he has had 2 meals today neither of which made his pain worse, he has no urinary symptoms other than decreased urinary flow today. No diarrhea, no swelling, no sore throat, no headache.  Patient is a 69 y.o. male presenting with flank pain. The history is provided by the patient.  Flank Pain    Past Medical History  Diagnosis Date  . Hypertension   . Urinary frequency   . Low serum testosterone level    Past Surgical History  Procedure Laterality Date  . Hernia repair    . Appendectomy     No family history on file. History  Substance Use Topics  . Smoking status: Current Some Day Smoker    Types: Cigars  . Smokeless tobacco: Not on file  . Alcohol Use: Yes     Comment: occasionally    Review of Systems  Genitourinary: Positive for flank pain.  All other systems reviewed and are negative.     Allergies  Review of patient's allergies indicates no known allergies.  Home Medications   Prior to Admission medications   Medication Sig Start Date End Date Taking? Authorizing Provider   amLODipine (NORVASC) 2.5 MG tablet Take 1 tablet by mouth daily. 05/20/14  Yes Historical Provider, MD  aspirin 325 MG tablet Take 650 mg by mouth daily as needed for moderate pain.   Yes Historical Provider, MD  aspirin EC 81 MG tablet Take 81 mg by mouth daily.   Yes Historical Provider, MD  Calcium Carbonate-Vitamin D (CALCIUM + D PO) Take 1 tablet by mouth daily.   Yes Historical Provider, MD  Glucosamine-Chondroitin-Vit D3 1500-1200-800 MG-MG-UNIT PACK Take 1 tablet by mouth daily.   Yes Historical Provider, MD  Lactobacillus (ACIDOPHILUS PROBIOTIC) 10 MG TABS Take 1 tablet by mouth daily.   Yes Historical Provider, MD  lisinopril (PRINIVIL,ZESTRIL) 20 MG tablet Take 1 tablet by mouth daily. 05/20/14  Yes Historical Provider, MD  Multiple Vitamin (MULTIVITAMIN WITH MINERALS) TABS tablet Take 1 tablet by mouth daily.   Yes Historical Provider, MD  Multiple Vitamins-Minerals (PRESERVISION AREDS 2 PO) Take 2 tablets by mouth daily.   Yes Historical Provider, MD  Omega Fatty Acids-Phytosterols (PA FISH OIL/PHYTOSTEROLS) 450-400 MG CAPS Take 1 capsule by mouth daily.   Yes Historical Provider, MD  Omega-3 Fatty Acids (FISH OIL) 1200 MG CAPS Take 1 capsule by mouth daily.   Yes Historical Provider, MD  tamsulosin (FLOMAX) 0.4 MG CAPS capsule Take 1 capsule by mouth daily. 05/30/14  Yes Historical Provider, MD  testosterone (ANDROGEL) 50 MG/5GM (1%) GEL Place 5 g onto the skin daily. 05/21/14  Yes Historical Provider, MD   BP 133/66  mmHg  Pulse 101  Temp(Src) 99.7 F (37.6 C) (Oral)  Resp 18  Ht 5\' 11"  (1.803 m)  Wt 195 lb (88.451 kg)  BMI 27.21 kg/m2  SpO2 87% Physical Exam  Constitutional: He appears well-developed and well-nourished. No distress.  HENT:  Head: Normocephalic and atraumatic.  Mouth/Throat: Oropharynx is clear and moist. No oropharyngeal exudate.  Eyes: Conjunctivae and EOM are normal. Pupils are equal, round, and reactive to light. Right eye exhibits no discharge. Left eye  exhibits no discharge. No scleral icterus.  Neck: Normal range of motion. Neck supple. No JVD present. No thyromegaly present.  Cardiovascular: Regular rhythm, normal heart sounds and intact distal pulses.  Exam reveals no gallop and no friction rub.   No murmur heard. Tachycardic to 105 bpm  Pulmonary/Chest: Effort normal and breath sounds normal. No respiratory distress. He has no wheezes. He has no rales.  Mild tachypnea present  Abdominal: Soft. Bowel sounds are normal. He exhibits no distension and no mass. There is tenderness. There is guarding.  No left-sided abdominal tenderness, minimal epigastric tenderness, significant right upper quadrant tenderness with guarding and positive Murphy sign, minimal tenderness right lower quadrant  Musculoskeletal: Normal range of motion. He exhibits no edema or tenderness.  Lymphadenopathy:    He has no cervical adenopathy.  Neurological: He is alert. Coordination normal.  Skin: Skin is warm and dry. No rash noted. No erythema.  Psychiatric: He has a normal mood and affect. His behavior is normal.  Nursing note and vitals reviewed.   ED Course  Procedures (including critical care time) Labs Review Labs Reviewed  COMPREHENSIVE METABOLIC PANEL - Abnormal; Notable for the following:    Glucose, Bld 114 (*)    BUN 28 (*)    Creatinine, Ser 1.40 (*)    GFR calc non Af Amer 50 (*)    GFR calc Af Amer 58 (*)    All other components within normal limits  CBC WITH DIFFERENTIAL/PLATELET - Abnormal; Notable for the following:    WBC 16.6 (*)    Neutrophils Relative % 88 (*)    Neutro Abs 14.5 (*)    Lymphocytes Relative 4 (*)    Lymphs Abs 0.6 (*)    Monocytes Absolute 1.4 (*)    All other components within normal limits  URINALYSIS, ROUTINE W REFLEX MICROSCOPIC - Abnormal; Notable for the following:    Ketones, ur TRACE (*)    All other components within normal limits  CBG MONITORING, ED - Abnormal; Notable for the following:     Glucose-Capillary 114 (*)    All other components within normal limits  LIPASE, BLOOD  TROPONIN I  I-STAT CG4 LACTIC ACID, ED  I-STAT CG4 LACTIC ACID, ED    Imaging Review Ct Abdomen Pelvis W Contrast  06/07/2014   CLINICAL DATA:  Right flank pain, generalized body aches, fever, shortness of breath  EXAM: CT ABDOMEN AND PELVIS WITH CONTRAST  TECHNIQUE: Multidetector CT imaging of the abdomen and pelvis was performed using the standard protocol following bolus administration of intravenous contrast.  CONTRAST:  172mL OMNIPAQUE IOHEXOL 300 MG/ML  SOLN  COMPARISON:  None.  FINDINGS: Lower chest: Patchy right middle and lower lobe opacities, suspicious for pneumonia. Associated small right pleural effusion.  Hepatobiliary: 11 mm probable cyst in the medial segment left hepatic lobe (series 2/ image 23).  Gallbladder is unremarkable. No intrahepatic or extrahepatic ductal dilatation.  Pancreas: Within normal limits.  Spleen: Within normal limits.  Adrenals/Urinary Tract: Adrenal glands are within normal  limits.  16 mm cyst in the lateral interpolar left kidney (series 2/ image 29). Right kidney is within normal limits. No hydronephrosis.  Bladder is within normal limits.  Stomach/Bowel: Stomach is within normal limits.  No evidence of bowel obstruction.  Prior appendectomy.  Mild colonic diverticulosis, without evidence of diverticulitis.  Vascular/Lymphatic: Atherosclerotic calcifications of the abdominal aorta and branch vessels.  No suspicious abdominopelvic lymphadenopathy.  Reproductive: Prostate is notable for dystrophic calcifications.  Other: No abdominopelvic ascites.  Postsurgical changes related to prior right inguinal hernia repair.  Musculoskeletal: Degenerative changes of the visualized thoracolumbar spine.  IMPRESSION: Patchy right middle and lower lobe opacities, suspicious for pneumonia. Associated small right pleural effusion.  No evidence of bowel obstruction.  Prior appendectomy.    Electronically Signed   By: Julian Hy M.D.   On: 06/07/2014 20:23   Dg Chest Port 1 View  06/07/2014   CLINICAL DATA:  PATIENT ARRIEVED TO ER BY EMS, RIGHT SIDE/FLANK PAIN STARTED TODAY AND SOB TODAY. BODY ACHES X 1 WEEK. LEFT SHOULDER PAIN, NAUSEA YESTERDAY. PATIENT STATES " HURTS TO BREATHE" STATES " HE SMOKES A PIPE" HISTORY OF HTN  EXAM: PORTABLE CHEST - 1 VIEW  COMPARISON:  None  FINDINGS: There is basilar opacity, most evident on right, which may all be atelectasis. Pneumonia is possible. There is no evidence of pulmonary edema. No pleural effusion or pneumothorax.  Cardiac silhouette is normal in size. No mediastinal or hilar masses. Bony thorax is demineralized but grossly intact.  IMPRESSION: Right greater than left lung base opacity. Atelectasis/scarring suspected. Infiltrate is possible. No pulmonary edema.   Electronically Signed   By: Lajean Manes M.D.   On: 06/07/2014 18:18     EKG Interpretation   Date/Time:  Saturday June 07 2014 17:16:19 EDT Ventricular Rate:  106 PR Interval:  140 QRS Duration: 84 QT Interval:  296 QTC Calculation: 393 R Axis:   27 Text Interpretation:  Sinus tachycardia Probable left atrial enlargement  Abnormal ekg No old tracing to compare Confirmed by Michiah Mudry  MD, Tychelle Purkey  [phone]) on 06/07/2014 5:26:48 PM      MDM   Final diagnoses:  SOB (shortness of breath)  Sepsis, due to unspecified organism  CAP (community acquired pneumonia)  AKI (acute kidney injury)    The patient has abdominal tenderness, with his shoulder and neck tenderness this is concerning for an abdominal process, he does not appear jaundiced, he does not appear to have postprandial pain, would consider intra-abdominal process, also intrathoracic process such as heart disease, lung disease, less likely to be pulmonary embolism, he has no peripheral edema or risk factors for such. Labs and x-rays pending.   Laboratory workup shows that the patient has a significant  leukocytosis of over 16,000, lactic acid is normal, he has renal dysfunction likely related to prerenal failure secondary to dehydration. His back pain is improved but is persistent, he continued to be short of breath and has hypoxia requiring oxygen, oxygen levels dropped to approximately 87-88% on room air. On supplemental oxygen he is approximately 93%. I have reviewed all of his imaging, there was a suspicious x-ray showing possible patchy pneumonia at the right base, CT scan confirms a right basilar pneumonia as well as a pleural effusion. The patient will be admitted to the hospital for treatment of what looks like significant community-acquired pneumonia, acute kidney injury, dehydration, likely early sepsis. Blood pressure has been normal, no signs of septic shock at this time.  D/w Dr. Nehemiah Settle who will  admit  CRITICAL CARE Performed by: Johnna Acosta Total critical care time: 35  Critical care time was exclusive of separately billable procedures and treating other patients. Critical care was necessary to treat or prevent imminent or life-threatening deterioration. Critical care was time spent personally by me on the following activities: development of treatment plan with patient and/or surrogate as well as nursing, discussions with consultants, evaluation of patient's response to treatment, examination of patient, obtaining history from patient or surrogate, ordering and performing treatments and interventions, ordering and review of laboratory studies, ordering and review of radiographic studies, pulse oximetry and re-evaluation of patient's condition.  Meds given in ED:  Medications  HYDROmorphone (DILAUDID) injection 0.5 mg (0.5 mg Intravenous Given 06/07/14 1719)  piperacillin-tazobactam (ZOSYN) IVPB 3.375 g (3.375 g Intravenous New Bag/Given 06/07/14 2031)  sodium chloride 0.9 % bolus 1,000 mL (0 mLs Intravenous Stopped 06/07/14 1938)  iohexol (OMNIPAQUE) 300 MG/ML solution 50 mL (50  mLs Oral Contrast Given 06/07/14 1727)  iohexol (OMNIPAQUE) 300 MG/ML solution 50 mL (50 mLs Oral Contrast Given 06/07/14 1951)  iohexol (OMNIPAQUE) 300 MG/ML solution 100 mL (100 mLs Intravenous Contrast Given 06/07/14 1951)     Noemi Chapel, MD 06/08/14 [phone]

## 2014-06-08 LAB — STREP PNEUMONIAE URINARY ANTIGEN: Strep Pneumo Urinary Antigen: NEGATIVE

## 2014-06-08 MED ORDER — ALBUTEROL SULFATE (2.5 MG/3ML) 0.083% IN NEBU: 2.5000 mg | INHALATION_SOLUTION | Freq: Four times a day (QID) | RESPIRATORY_TRACT | Status: DC | PRN

## 2014-06-08 NOTE — Progress Notes (Signed)
Patient admitted with right middle lobe and right lower lobe infiltrate and pleuritic chest pain fever and cough placed on IV Samier Jaco FGH:[id number] DOB: [birth year] DOA: 06/07/2014 PCP: Alonza Bogus, MD             Physical Exam: Blood pressure 133/74, pulse 89, temperature 98.9 F (37.2 C), temperature source Oral, resp. rate 20, height 5\' 11"  (1.803 m), weight 195 lb (88.451 kg), SpO2 95 %. no JVD no carotid bruits. Lungs show mildly prolonged history phase no rales wheeze or rhonchi appreciable heart regular rhythm no S3-S4 no heaves thrills rubs abdomen no CVA tenderness no right upper quadrant old right lower quadrant tenderness no guarding or rebound   Investigations:  Recent Results (from the past 240 hour(s))  Culture, blood (routine x 2) Call MD if unable to obtain prior to antibiotics being given     Status: None (Preliminary result)   Collection Time: 06/07/14  9:53 PM  Result Value Ref Range Status   Specimen Description RIGHT ANTECUBITAL  Final   Special Requests BOTTLES DRAWN AEROBIC AND ANAEROBIC 6CC  Final   Culture NO GROWTH 1 DAY  Final   Report Status PENDING  Incomplete  Culture, blood (routine x 2) Call MD if unable to obtain prior to antibiotics being given     Status: None (Preliminary result)   Collection Time: 06/07/14 10:03 PM  Result Value Ref Range Status   Specimen Description BLOOD RIGHT HAND  Final   Special Requests BOTTLES DRAWN AEROBIC ONLY [place]  Final   Culture NO GROWTH 1 DAY  Final   Report Status PENDING  Incomplete     Basic Metabolic Panel:  Recent Labs  06/07/14 1805  NA 138  K 4.3  CL 103  CO2 26  GLUCOSE 114*  BUN 28*  CREATININE 1.40*  CALCIUM 8.7   Liver Function Tests:  Recent Labs  06/07/14 1805  AST 13  ALT 14  ALKPHOS 43  BILITOT 0.8  PROT 7.1  ALBUMIN 3.9     CBC:  Recent Labs  06/07/14 1805  WBC 16.6*  NEUTROABS 14.5*  HGB 14.5  HCT 42.5  MCV 94.4  PLT 228    Ct Abdomen  Pelvis W Contrast  06/07/2014   CLINICAL DATA:  Right flank pain, generalized body aches, fever, shortness of breath  EXAM: CT ABDOMEN AND PELVIS WITH CONTRAST  TECHNIQUE: Multidetector CT imaging of the abdomen and pelvis was performed using the standard protocol following bolus administration of intravenous contrast.  CONTRAST:  136mL OMNIPAQUE IOHEXOL 300 MG/ML  SOLN  COMPARISON:  None.  FINDINGS: Lower chest: Patchy right middle and lower lobe opacities, suspicious for pneumonia. Associated small right pleural effusion.  Hepatobiliary: 11 mm probable cyst in the medial segment left hepatic lobe (series 2/ image 23).  Gallbladder is unremarkable. No intrahepatic or extrahepatic ductal dilatation.  Pancreas: Within normal limits.  Spleen: Within normal limits.  Adrenals/Urinary Tract: Adrenal glands are within normal limits.  16 mm cyst in the lateral interpolar left kidney (series 2/ image 29). Right kidney is within normal limits. No hydronephrosis.  Bladder is within normal limits.  Stomach/Bowel: Stomach is within normal limits.  No evidence of bowel obstruction.  Prior appendectomy.  Mild colonic diverticulosis, without evidence of diverticulitis.  Vascular/Lymphatic: Atherosclerotic calcifications of the abdominal aorta and branch vessels.  No suspicious abdominopelvic lymphadenopathy.  Reproductive: Prostate is notable for dystrophic calcifications.  Other: No abdominopelvic ascites.  Postsurgical changes related to prior right inguinal hernia repair.  Musculoskeletal: Degenerative changes of the visualized thoracolumbar spine.  IMPRESSION: Patchy right middle and lower lobe opacities, suspicious for pneumonia. Associated small right pleural effusion.  No evidence of bowel obstruction.  Prior appendectomy.   Electronically Signed   By: Julian Hy M.D.   On: 06/07/2014 20:23   Dg Chest Port 1 View  06/07/2014   CLINICAL DATA:  PATIENT ARRIEVED TO ER BY EMS, RIGHT SIDE/FLANK PAIN STARTED TODAY AND  SOB TODAY. BODY ACHES X 1 WEEK. LEFT SHOULDER PAIN, NAUSEA YESTERDAY. PATIENT STATES " HURTS TO BREATHE" STATES " HE SMOKES A PIPE" HISTORY OF HTN  EXAM: PORTABLE CHEST - 1 VIEW  COMPARISON:  None  FINDINGS: There is basilar opacity, most evident on right, which may all be atelectasis. Pneumonia is possible. There is no evidence of pulmonary edema. No pleural effusion or pneumothorax.  Cardiac silhouette is normal in size. No mediastinal or hilar masses. Bony thorax is demineralized but grossly intact.  IMPRESSION: Right greater than left lung base opacity. Atelectasis/scarring suspected. Infiltrate is possible. No pulmonary edema.   Electronically Signed   By: Lajean Manes M.D.   On: 06/07/2014 18:18      Medications:   Impression: Pleuritic right-sided chest pain  Principal Problem:   Acute respiratory failure with hypoxia Active Problems:   CAP (community acquired pneumonia)   Acute renal injury   Dehydration     Plan: IV Levaquin. Nebulizer therapy every 6 hours   Consultants:    Procedures   Antibiotics: IV Levaquin                  Code Status: Full   Family Communication:  Spoke with patient and wife  Disposition Plan continue IV Levaquin and nebulizer therapy  Time spent: 30 minutes   LOS: 1 day   Oakes Mccready M   06/08/2014, 2:01 PM

## 2014-06-09 LAB — LEGIONELLA ANTIGEN, URINE

## 2014-06-09 LAB — BASIC METABOLIC PANEL
ANION GAP: 7 (ref 5–15)
BUN: 21 mg/dL (ref 6–23)
CALCIUM: 8 mg/dL — AB (ref 8.4–10.5)
CO2: 25 mmol/L (ref 19–32)
Chloride: 105 mmol/L (ref 96–112)
Creatinine, Ser: 1.3 mg/dL (ref 0.50–1.35)
GFR calc Af Amer: 63 mL/min — ABNORMAL LOW (ref >90)
GFR calc non Af Amer: 54 mL/min — ABNORMAL LOW (ref >90)
GLUCOSE: 115 mg/dL — AB (ref 70–99)
POTASSIUM: 3.9 mmol/L (ref 3.5–5.1)
SODIUM: 137 mmol/L (ref 135–145)

## 2014-06-09 NOTE — Progress Notes (Signed)
Subjective: He says he feels better. He is still coughing and still has chest pain. He is still somewhat short of breath  Objective: Vital signs in last 24 hours: Temp:  [98 F (36.7 C)-99.1 F (37.3 C)] 98.3 F (36.8 C) (03/21 0541) Pulse Rate:  [87-103] 99 (03/21 0541) Resp:  [15-20] 15 (03/21 0541) BP: (111-173)/(54-80) 173/66 mmHg (03/21 0541) SpO2:  [91 %-97 %] 96 % (03/21 0541) Weight change:  Last BM Date: 06/07/14  Intake/Output from previous day: 03/20 0701 - 03/21 0700 In: 2095.8 [P.O.:1200; I.V.:895.8] Out: 1300 [Urine:1300]  PHYSICAL EXAM General appearance: alert, cooperative and mild distress Resp: rhonchi RLL and RML Cardio: regular rate and rhythm, S1, S2 normal, no murmur, click, rub or gallop GI: soft, non-tender; bowel sounds normal; no masses,  no organomegaly Extremities: extremities normal, atraumatic, no cyanosis or edema  Lab Results:  Results for orders placed or performed during the hospital encounter of 06/07/14 (from the past 48 hour(s))  CBG monitoring, ED     Status: Abnormal   Collection Time: 06/07/14  5:15 PM  Result Value Ref Range   Glucose-Capillary 114 (H) 70 - 99 mg/dL  Lipase, blood     Status: None   Collection Time: 06/07/14  6:05 PM  Result Value Ref Range   Lipase 23 11 - 59 U/L  Comprehensive metabolic panel     Status: Abnormal   Collection Time: 06/07/14  6:05 PM  Result Value Ref Range   Sodium 138 135 - 145 mmol/L   Potassium 4.3 3.5 - 5.1 mmol/L   Chloride 103 96 - 112 mmol/L   CO2 26 19 - 32 mmol/L   Glucose, Bld 114 (H) 70 - 99 mg/dL   BUN 28 (H) 6 - 23 mg/dL   Creatinine, Ser 1.40 (H) 0.50 - 1.35 mg/dL   Calcium 8.7 8.4 - 10.5 mg/dL   Total Protein 7.1 6.0 - 8.3 g/dL   Albumin 3.9 3.5 - 5.2 g/dL   AST 13 0 - 37 U/L   ALT 14 0 - 53 U/L   Alkaline Phosphatase 43 39 - 117 U/L   Total Bilirubin 0.8 0.3 - 1.2 mg/dL   GFR calc non Af Amer 50 (L) >90 mL/min   GFR calc Af Amer 58 (L) >90 mL/min    Comment:  (NOTE) The eGFR has been calculated using the CKD EPI equation. This calculation has not been validated in all clinical situations. eGFR's persistently <90 mL/min signify possible Chronic Kidney Disease.    Anion gap 9 5 - 15  CBC with Differential/Platelet     Status: Abnormal   Collection Time: 06/07/14  6:05 PM  Result Value Ref Range   WBC 16.6 (H) 4.0 - 10.5 K/uL   RBC 4.50 4.22 - 5.81 MIL/uL   Hemoglobin 14.5 13.0 - 17.0 g/dL   HCT 42.5 39.0 - 52.0 %   MCV 94.4 78.0 - 100.0 fL   MCH 32.2 26.0 - 34.0 pg   MCHC 34.1 30.0 - 36.0 g/dL   RDW 13.0 11.5 - 15.5 %   Platelets 228 150 - 400 K/uL   Neutrophils Relative % 88 (H) 43 - 77 %   Neutro Abs 14.5 (H) 1.7 - 7.7 K/uL   Lymphocytes Relative 4 (L) 12 - 46 %   Lymphs Abs 0.6 (L) 0.7 - 4.0 K/uL   Monocytes Relative 8 3 - 12 %   Monocytes Absolute 1.4 (H) 0.1 - 1.0 K/uL   Eosinophils Relative 0 0 - 5 %  Eosinophils Absolute 0.0 0.0 - 0.7 K/uL   Basophils Relative 0 0 - 1 %   Basophils Absolute 0.0 0.0 - 0.1 K/uL  Troponin I     Status: None   Collection Time: 06/07/14  6:05 PM  Result Value Ref Range   Troponin I <0.03 <0.031 ng/mL    Comment:        NO INDICATION OF MYOCARDIAL INJURY.   I-Stat CG4 Lactic Acid, ED     Status: None   Collection Time: 06/07/14  6:10 PM  Result Value Ref Range   Lactic Acid, Venous 0.76 0.5 - 2.0 mmol/L  Urinalysis, Routine w reflex microscopic     Status: Abnormal   Collection Time: 06/07/14  6:13 PM  Result Value Ref Range   Color, Urine YELLOW YELLOW   APPearance CLEAR CLEAR   Specific Gravity, Urine 1.025 1.005 - 1.030   pH 5.5 5.0 - 8.0   Glucose, UA NEGATIVE NEGATIVE mg/dL   Hgb urine dipstick NEGATIVE NEGATIVE   Bilirubin Urine NEGATIVE NEGATIVE   Ketones, ur TRACE (A) NEGATIVE mg/dL   Protein, ur NEGATIVE NEGATIVE mg/dL   Urobilinogen, UA 0.2 0.0 - 1.0 mg/dL   Nitrite NEGATIVE NEGATIVE   Leukocytes, UA NEGATIVE NEGATIVE    Comment: MICROSCOPIC NOT DONE ON URINES WITH  NEGATIVE PROTEIN, BLOOD, LEUKOCYTES, NITRITE, OR GLUCOSE <1000 mg/dL.  I-Stat CG4 Lactic Acid, ED     Status: None   Collection Time: 06/07/14  8:18 PM  Result Value Ref Range   Lactic Acid, Venous 0.94 0.5 - 2.0 mmol/L  Culture, blood (routine x 2) Call MD if unable to obtain prior to antibiotics being given     Status: None (Preliminary result)   Collection Time: 06/07/14  9:53 PM  Result Value Ref Range   Specimen Description RIGHT ANTECUBITAL    Special Requests BOTTLES DRAWN AEROBIC AND ANAEROBIC 6CC    Culture NO GROWTH 1 DAY    Report Status PENDING   Culture, blood (routine x 2) Call MD if unable to obtain prior to antibiotics being given     Status: None (Preliminary result)   Collection Time: 06/07/14 10:03 PM  Result Value Ref Range   Specimen Description BLOOD RIGHT HAND    Special Requests BOTTLES DRAWN AEROBIC ONLY [place]    Culture NO GROWTH 1 DAY    Report Status PENDING   Strep pneumoniae urinary antigen     Status: None   Collection Time: 06/07/14 11:27 PM  Result Value Ref Range   Strep Pneumo Urinary Antigen NEGATIVE NEGATIVE    Comment: PERFORMED AT [facility]        Infection due to S. pneumoniae cannot be absolutely ruled out since the antigen present may be below the detection limit of the test. Performed at [place] metabolic panel     Status: Abnormal   Collection Time: 06/09/14  7:53 AM  Result Value Ref Range   Sodium 137 135 - 145 mmol/L   Potassium 3.9 3.5 - 5.1 mmol/L   Chloride 105 96 - 112 mmol/L   CO2 25 19 - 32 mmol/L   Glucose, Bld 115 (H) 70 - 99 mg/dL   BUN 21 6 - 23 mg/dL   Creatinine, Ser 1.30 0.50 - 1.35 mg/dL   Calcium 8.0 (L) 8.4 - 10.5 mg/dL   GFR calc non Af Amer 54 (L) >90 mL/min   GFR calc Af Amer 63 (L) >90 mL/min    Comment: (NOTE)  The eGFR has been calculated using the CKD EPI equation. This calculation has not been validated in all clinical situations. eGFR's persistently <90 mL/min signify  possible Chronic Kidney Disease.    Anion gap 7 5 - 15    ABGS No results for input(s): PHART, PO2ART, TCO2, HCO3 in the last 72 hours.  Invalid input(s): PCO2 CULTURES Recent Results (from the past 240 hour(s))  Culture, blood (routine x 2) Call MD if unable to obtain prior to antibiotics being given     Status: None (Preliminary result)   Collection Time: 06/07/14  9:53 PM  Result Value Ref Range Status   Specimen Description RIGHT ANTECUBITAL  Final   Special Requests BOTTLES DRAWN AEROBIC AND ANAEROBIC 6CC  Final   Culture NO GROWTH 1 DAY  Final   Report Status PENDING  Incomplete  Culture, blood (routine x 2) Call MD if unable to obtain prior to antibiotics being given     Status: None (Preliminary result)   Collection Time: 06/07/14 10:03 PM  Result Value Ref Range Status   Specimen Description BLOOD RIGHT HAND  Final   Special Requests BOTTLES DRAWN AEROBIC ONLY [place]  Final   Culture NO GROWTH 1 DAY  Final   Report Status PENDING  Incomplete   Studies/Results: Ct Abdomen Pelvis W Contrast  06/07/2014   CLINICAL DATA:  Right flank pain, generalized body aches, fever, shortness of breath  EXAM: CT ABDOMEN AND PELVIS WITH CONTRAST  TECHNIQUE: Multidetector CT imaging of the abdomen and pelvis was performed using the standard protocol following bolus administration of intravenous contrast.  CONTRAST:  129mL OMNIPAQUE IOHEXOL 300 MG/ML  SOLN  COMPARISON:  None.  FINDINGS: Lower chest: Patchy right middle and lower lobe opacities, suspicious for pneumonia. Associated small right pleural effusion.  Hepatobiliary: 11 mm probable cyst in the medial segment left hepatic lobe (series 2/ image 23).  Gallbladder is unremarkable. No intrahepatic or extrahepatic ductal dilatation.  Pancreas: Within normal limits.  Spleen: Within normal limits.  Adrenals/Urinary Tract: Adrenal glands are within normal limits.  16 mm cyst in the lateral interpolar left kidney (series 2/ image 29). Right kidney is  within normal limits. No hydronephrosis.  Bladder is within normal limits.  Stomach/Bowel: Stomach is within normal limits.  No evidence of bowel obstruction.  Prior appendectomy.  Mild colonic diverticulosis, without evidence of diverticulitis.  Vascular/Lymphatic: Atherosclerotic calcifications of the abdominal aorta and branch vessels.  No suspicious abdominopelvic lymphadenopathy.  Reproductive: Prostate is notable for dystrophic calcifications.  Other: No abdominopelvic ascites.  Postsurgical changes related to prior right inguinal hernia repair.  Musculoskeletal: Degenerative changes of the visualized thoracolumbar spine.  IMPRESSION: Patchy right middle and lower lobe opacities, suspicious for pneumonia. Associated small right pleural effusion.  No evidence of bowel obstruction.  Prior appendectomy.   Electronically Signed   By: Julian Hy M.D.   On: 06/07/2014 20:23   Dg Chest Port 1 View  06/07/2014   CLINICAL DATA:  PATIENT ARRIEVED TO ER BY EMS, RIGHT SIDE/FLANK PAIN STARTED TODAY AND SOB TODAY. BODY ACHES X 1 WEEK. LEFT SHOULDER PAIN, NAUSEA YESTERDAY. PATIENT STATES " HURTS TO BREATHE" STATES " HE SMOKES A PIPE" HISTORY OF HTN  EXAM: PORTABLE CHEST - 1 VIEW  COMPARISON:  None  FINDINGS: There is basilar opacity, most evident on right, which may all be atelectasis. Pneumonia is possible. There is no evidence of pulmonary edema. No pleural effusion or pneumothorax.  Cardiac silhouette is normal in size. No mediastinal or hilar masses.  Bony thorax is demineralized but grossly intact.  IMPRESSION: Right greater than left lung base opacity. Atelectasis/scarring suspected. Infiltrate is possible. No pulmonary edema.   Electronically Signed   By: Lajean Manes M.D.   On: 06/07/2014 18:18    Medications:  Prior to Admission:  Prescriptions prior to admission  Medication Sig Dispense Refill Last Dose  . amLODipine (NORVASC) 2.5 MG tablet Take 1 tablet by mouth daily.   06/07/2014  . aspirin EC  81 MG tablet Take 81 mg by mouth daily.   06/07/2014 at Unknown time  . Calcium Carbonate-Vitamin D (CALCIUM + D PO) Take 1 tablet by mouth daily.   06/07/2014 at Unknown time  . Glucosamine-Chondroitin-Vit D3 1500-1200-800 MG-MG-UNIT PACK Take 1 tablet by mouth daily.   06/07/2014 at Unknown time  . Lactobacillus (ACIDOPHILUS PROBIOTIC) 10 MG TABS Take 1 tablet by mouth daily.   06/07/2014  . lisinopril (PRINIVIL,ZESTRIL) 20 MG tablet Take 1 tablet by mouth daily.   06/07/2014  . Multiple Vitamin (MULTIVITAMIN WITH MINERALS) TABS tablet Take 1 tablet by mouth daily.   06/07/2014  . Multiple Vitamins-Minerals (PRESERVISION AREDS 2 PO) Take 2 tablets by mouth daily.   06/07/2014  . Omega Fatty Acids-Phytosterols (PA FISH OIL/PHYTOSTEROLS) 450-400 MG CAPS Take 1 capsule by mouth daily.   06/07/2014 at Unknown time  . Omega-3 Fatty Acids (FISH OIL) 1200 MG CAPS Take 1 capsule by mouth daily.   06/07/2014  . tamsulosin (FLOMAX) 0.4 MG CAPS capsule Take 1 capsule by mouth daily.   06/07/2014  . testosterone (ANDROGEL) 50 MG/5GM (1%) GEL Place 5 g onto the skin daily.   06/07/2014   Scheduled: . amLODipine  2.5 mg Oral Daily  . aspirin EC  81 mg Oral Daily  . enoxaparin (LOVENOX) injection  40 mg Subcutaneous Q24H  . levofloxacin (LEVAQUIN) IV  750 mg Intravenous Q24H  . lisinopril  20 mg Oral Daily  . methylPREDNISolone (SOLU-MEDROL) injection  60 mg Intravenous Daily  . tamsulosin  0.4 mg Oral Daily  . testosterone  5 g Transdermal Daily   Continuous: . sodium chloride 125 mL/hr at 06/09/14 0437   WER:[id number], HYDROmorphone (DILAUDID) injection, oxyCODONE  Assesment: He has community-acquired pneumonia. He has acute hypoxic respiratory failure from this. He is dehydrated and has acute renal injury from that. He has mild COPD which has been asymptomatic Principal Problem:   Acute respiratory failure with hypoxia Active Problems:   CAP (community acquired pneumonia)   Acute renal injury    Dehydration    Plan: Continue with current treatments. He is not ready for discharge.    LOS: 2 days   Sharia Averitt L 06/09/2014, 9:15 AM

## 2014-06-09 NOTE — Care Management Note (Addendum)
    Page 1 of 1   06/10/2014     10:56:18 AM CARE MANAGEMENT NOTE 06/10/2014  Patient:  Kittell,Trampus   Account Number:  [id number]  Date Initiated:  06/09/2014  Documentation initiated by:  Theophilus Kinds  Subjective/Objective Assessment:   Pt admitted from home with pneumonia and renal failure. Pt lives with his wife and will return home at discharge. Pt is independent with ADL's.     Action/Plan:   Will assess need for home O2 prior to discharge.   Anticipated DC Date:  06/11/2014   Anticipated DC Plan:  [place]  CM consult      Choice offered to / List presented to:             Status of service:  Completed, signed off Medicare Important Message given?  YES (If response is "NO", the following Medicare IM given date fields will be blank) Date Medicare IM given:  06/10/2014 Medicare IM given by:  Theophilus Kinds Date Additional Medicare IM given:   Additional Medicare IM given by:    Discharge Disposition:  HOME/SELF CARE  Per UR Regulation:    If discussed at Long Length of Stay Meetings, dates discussed:    Comments:  06/10/14 [place], RN BSN CM Pt discharged home today. No CM needs noted. Pt does not qualify for home O2 at this time.  06/09/14 [place], RN BSN CM

## 2014-06-09 NOTE — Clinical Documentation Improvement (Signed)
Presents with Pneumonia, Acute Hypoxic Respiratory Failure, COPD.   Sepsis documented in ED but not in Medical Progress Notes or found on Problem List  WBC = 16.6, tachycardic, tachypneic with organ failure/source of infection noted  IV Levaquin initiated  In order for chart to be coded correctly, please document if you feel Sepsis: Ruled in              Ruled out              Unable to determine  By documenting your findings, I am able to see you acknowledged the query and then I'm able to determine which diagnosis should be the Principle Diagnosis.   Thank You, Zoila Shutter ,RN Clinical Documentation Specialist:  [place] Information Management

## 2014-06-10 DIAGNOSIS — R6510 Systemic inflammatory response syndrome (SIRS) of non-infectious origin without acute organ dysfunction: Secondary | ICD-10-CM | POA: Diagnosis present

## 2014-06-10 LAB — BASIC METABOLIC PANEL
ANION GAP: 9 (ref 5–15)
BUN: 21 mg/dL (ref 6–23)
CO2: 24 mmol/L (ref 19–32)
Calcium: 8.2 mg/dL — ABNORMAL LOW (ref 8.4–10.5)
Chloride: 108 mmol/L (ref 96–112)
Creatinine, Ser: 1.21 mg/dL (ref 0.50–1.35)
GFR calc non Af Amer: 59 mL/min — ABNORMAL LOW (ref >90)
GFR, EST AFRICAN AMERICAN: 69 mL/min — AB (ref >90)
Glucose, Bld: 121 mg/dL — ABNORMAL HIGH (ref 70–99)
Potassium: 3.9 mmol/L (ref 3.5–5.1)
Sodium: 141 mmol/L (ref 135–145)

## 2014-06-10 LAB — CBC WITH DIFFERENTIAL/PLATELET
Basophils Absolute: 0 10*3/uL (ref 0.0–0.1)
Basophils Relative: 0 % (ref 0–1)
Eosinophils Absolute: 0 10*3/uL (ref 0.0–0.7)
Eosinophils Relative: 0 % (ref 0–5)
HEMATOCRIT: 37.9 % — AB (ref 39.0–52.0)
Hemoglobin: 12.7 g/dL — ABNORMAL LOW (ref 13.0–17.0)
Lymphocytes Relative: 6 % — ABNORMAL LOW (ref 12–46)
Lymphs Abs: 1 10*3/uL (ref 0.7–4.0)
MCH: 31.4 pg (ref 26.0–34.0)
MCHC: 33.5 g/dL (ref 30.0–36.0)
MCV: 93.6 fL (ref 78.0–100.0)
MONO ABS: 1.4 10*3/uL — AB (ref 0.1–1.0)
MONOS PCT: 8 % (ref 3–12)
Neutro Abs: 15.6 10*3/uL — ABNORMAL HIGH (ref 1.7–7.7)
Neutrophils Relative %: 86 % — ABNORMAL HIGH (ref 43–77)
Platelets: 271 10*3/uL (ref 150–400)
RBC: 4.05 MIL/uL — ABNORMAL LOW (ref 4.22–5.81)
RDW: 13.2 % (ref 11.5–15.5)
WBC: 18 10*3/uL — ABNORMAL HIGH (ref 4.0–10.5)

## 2014-06-10 LAB — EXPECTORATED SPUTUM ASSESSMENT W REFEX TO RESP CULTURE

## 2014-06-10 LAB — EXPECTORATED SPUTUM ASSESSMENT W GRAM STAIN, RFLX TO RESP C

## 2014-06-10 MED ORDER — METHYLPREDNISOLONE (PAK) 4 MG PO TABS: ORAL_TABLET | ORAL | Status: DC

## 2014-06-10 MED ORDER — LEVOFLOXACIN 500 MG PO TABS: 500.0000 mg | ORAL_TABLET | Freq: Every day | ORAL | Status: DC

## 2014-06-10 NOTE — Progress Notes (Signed)
Subjective: He says he feels much better and wants to go home. He is still coughing some.  Objective: Vital signs in last 24 hours: Temp:  [97.6 F (36.4 C)-98.4 F (36.9 C)] 98.4 F (36.9 C) (03/22 0527) Pulse Rate:  [86-94] 94 (03/22 0527) Resp:  [18-20] 20 (03/22 0527) BP: (156-172)/(73-75) 172/73 mmHg (03/22 0527) SpO2:  [94 %-95 %] 95 % (03/22 0527) Weight change:  Last BM Date: 06/09/14  Intake/Output from previous day: 03/21 0701 - 03/22 0700 In: 2337.5 [P.O.:600; I.V.:1587.5; IV Piggyback:150] Out: 2900 [Urine:2900]  PHYSICAL EXAM General appearance: alert, cooperative and no distress Resp: rales RLL Cardio: regular rate and rhythm, S1, S2 normal, no murmur, click, rub or gallop GI: soft, non-tender; bowel sounds normal; no masses,  no organomegaly Extremities: extremities normal, atraumatic, no cyanosis or edema  Lab Results:  Results for orders placed or performed during the hospital encounter of 06/07/14 (from the past 48 hour(s))  Basic metabolic panel     Status: Abnormal   Collection Time: 06/09/14  7:53 AM  Result Value Ref Range   Sodium 137 135 - 145 mmol/L   Potassium 3.9 3.5 - 5.1 mmol/L   Chloride 105 96 - 112 mmol/L   CO2 25 19 - 32 mmol/L   Glucose, Bld 115 (H) 70 - 99 mg/dL   BUN 21 6 - 23 mg/dL   Creatinine, Ser 1.30 0.50 - 1.35 mg/dL   Calcium 8.0 (L) 8.4 - 10.5 mg/dL   GFR calc non Af Amer 54 (L) >90 mL/min   GFR calc Af Amer 63 (L) >90 mL/min    Comment: (NOTE) The eGFR has been calculated using the CKD EPI equation. This calculation has not been validated in all clinical situations. eGFR's persistently <90 mL/min signify possible Chronic Kidney Disease.    Anion gap 7 5 - 15  Basic metabolic panel     Status: Abnormal   Collection Time: 06/10/14  6:21 AM  Result Value Ref Range   Sodium 141 135 - 145 mmol/L   Potassium 3.9 3.5 - 5.1 mmol/L   Chloride 108 96 - 112 mmol/L   CO2 24 19 - 32 mmol/L   Glucose, Bld 121 (H) 70 - 99 mg/dL    BUN 21 6 - 23 mg/dL   Creatinine, Ser 1.21 0.50 - 1.35 mg/dL   Calcium 8.2 (L) 8.4 - 10.5 mg/dL   GFR calc non Af Amer 59 (L) >90 mL/min   GFR calc Af Amer 69 (L) >90 mL/min    Comment: (NOTE) The eGFR has been calculated using the CKD EPI equation. This calculation has not been validated in all clinical situations. eGFR's persistently <90 mL/min signify possible Chronic Kidney Disease.    Anion gap 9 5 - 15  CBC with Differential/Platelet     Status: Abnormal   Collection Time: 06/10/14  6:21 AM  Result Value Ref Range   WBC 18.0 (H) 4.0 - 10.5 K/uL   RBC 4.05 (L) 4.22 - 5.81 MIL/uL   Hemoglobin 12.7 (L) 13.0 - 17.0 g/dL   HCT 37.9 (L) 39.0 - 52.0 %   MCV 93.6 78.0 - 100.0 fL   MCH 31.4 26.0 - 34.0 pg   MCHC 33.5 30.0 - 36.0 g/dL   RDW 13.2 11.5 - 15.5 %   Platelets 271 150 - 400 K/uL   Neutrophils Relative % 86 (H) 43 - 77 %   Neutro Abs 15.6 (H) 1.7 - 7.7 K/uL   Lymphocytes Relative 6 (L) 12 -  46 %   Lymphs Abs 1.0 0.7 - 4.0 K/uL   Monocytes Relative 8 3 - 12 %   Monocytes Absolute 1.4 (H) 0.1 - 1.0 K/uL   Eosinophils Relative 0 0 - 5 %   Eosinophils Absolute 0.0 0.0 - 0.7 K/uL   Basophils Relative 0 0 - 1 %   Basophils Absolute 0.0 0.0 - 0.1 K/uL  Culture, sputum-assessment     Status: None   Collection Time: 06/10/14  7:14 AM  Result Value Ref Range   Specimen Description SPUTUM EXPECTORATED    Special Requests NONE    Sputum evaluation      THIS SPECIMEN IS ACCEPTABLE. RESPIRATORY CULTURE REPORT TO FOLLOW. Performed at [facility]    Report Status 06/10/2014 FINAL     ABGS No results for input(s): PHART, PO2ART, TCO2, HCO3 in the last 72 hours.  Invalid input(s): PCO2 CULTURES Recent Results (from the past 240 hour(s))  Culture, blood (routine x 2) Call MD if unable to obtain prior to antibiotics being given     Status: None (Preliminary result)   Collection Time: 06/07/14  9:53 PM  Result Value Ref Range Status   Specimen Description BLOOD  RIGHT ANTECUBITAL  Final   Special Requests BOTTLES DRAWN AEROBIC AND ANAEROBIC 6CC  Final   Culture NO GROWTH 3 DAYS  Final   Report Status PENDING  Incomplete  Culture, blood (routine x 2) Call MD if unable to obtain prior to antibiotics being given     Status: None (Preliminary result)   Collection Time: 06/07/14 10:03 PM  Result Value Ref Range Status   Specimen Description BLOOD RIGHT HAND  Final   Special Requests BOTTLES DRAWN AEROBIC ONLY [place]  Final   Culture NO GROWTH 3 DAYS  Final   Report Status PENDING  Incomplete  Culture, sputum-assessment     Status: None   Collection Time: 06/10/14  7:14 AM  Result Value Ref Range Status   Specimen Description SPUTUM EXPECTORATED  Final   Special Requests NONE  Final   Sputum evaluation   Final    THIS SPECIMEN IS ACCEPTABLE. RESPIRATORY CULTURE REPORT TO FOLLOW. Performed at [facility]    Report Status 06/10/2014 FINAL  Final   Studies/Results: No results found.  Medications:  Prior to Admission:  No prescriptions prior to admission   Scheduled: Continuous: PRN:  Assesment: He has community-acquired pneumonia. He had hypoxia from that and had systemic inflammatory response. He is much improved and ready for discharge Principal Problem:   Acute respiratory failure with hypoxia Active Problems:   CAP (community acquired pneumonia)   Acute renal injury   Dehydration   COPD (chronic obstructive pulmonary disease)   SIRS (systemic inflammatory response syndrome)    Plan: Discharge home    LOS: 3 days   Brighten Buzzelli L 06/10/2014, 6:45 PM

## 2014-06-10 NOTE — Progress Notes (Signed)
Discharge instructions given, verbalized understanding, out in stable condition with staff ambulatory.

## 2014-06-10 NOTE — Progress Notes (Signed)
SATURATION QUALIFICATIONS: (This note is used to comply with regulatory documentation for home oxygen)  Patient Saturations on Room Air at Rest = 95%  Patient Saturations on Room Air while Ambulating = 94%  Patient Saturations on 0 Liters of oxygen while Ambulating = 95%

## 2014-06-10 NOTE — Progress Notes (Signed)
UR chart review completed.  

## 2014-06-11 ENCOUNTER — Telehealth: Payer: Self-pay | Admitting: Gastroenterology

## 2014-06-11 NOTE — Telephone Encounter (Signed)
On recall for triage in may

## 2014-06-11 NOTE — Discharge Summary (Signed)
Physician Discharge Summary  Patient ID: [name] [name] MRN: [id number] DOB/AGE: 1945-10-10 69 y.o. Primary Care Physician:Bradlee Bridgers L, MD Admit date: 06/07/2014 Discharge date: 06/10/2014    Discharge Diagnoses:   Principal Problem:   Acute respiratory failure with hypoxia Active Problems:   CAP (community acquired pneumonia)   Acute renal injury   Dehydration   COPD (chronic obstructive pulmonary disease)   SIRS (systemic inflammatory response syndrome)     Medication List    TAKE these medications        ACIDOPHILUS PROBIOTIC 10 MG Tabs  Take 1 tablet by mouth daily.     amLODipine 2.5 MG tablet  Commonly known as:  NORVASC  Take 1 tablet by mouth daily.     aspirin EC 81 MG tablet  Take 81 mg by mouth daily.     CALCIUM + D PO  Take 1 tablet by mouth daily.     Fish Oil 1200 MG Caps  Take 1 capsule by mouth daily.     Glucosamine-Chondroitin-Vit D3 1500-1200-800 MG-MG-UNIT Pack  Take 1 tablet by mouth daily.     levofloxacin 500 MG tablet  Commonly known as:  LEVAQUIN  Take 1 tablet (500 mg total) by mouth daily.     lisinopril 20 MG tablet  Commonly known as:  PRINIVIL,ZESTRIL  Take 1 tablet by mouth daily.     methylPREDNIsolone 4 MG tablet  Commonly known as:  MEDROL DOSPACK  follow package directions     multivitamin with minerals Tabs tablet  Take 1 tablet by mouth daily.     PA FISH OIL/PHYTOSTEROLS 450-400 MG Caps  Take 1 capsule by mouth daily.     PRESERVISION AREDS 2 PO  Take 2 tablets by mouth daily.     tamsulosin 0.4 MG Caps capsule  Commonly known as:  FLOMAX  Take 1 capsule by mouth daily.     testosterone 50 MG/5GM (1%) Gel  Commonly known as:  ANDROGEL  Place 5 g onto the skin daily.        Discharged Condition: Improved    Consults: None  Significant Diagnostic Studies: Ct Abdomen Pelvis W Contrast  06/07/2014   CLINICAL DATA:  Right flank pain, generalized body aches, fever, shortness of breath  EXAM: CT  ABDOMEN AND PELVIS WITH CONTRAST  TECHNIQUE: Multidetector CT imaging of the abdomen and pelvis was performed using the standard protocol following bolus administration of intravenous contrast.  CONTRAST:  166mL OMNIPAQUE IOHEXOL 300 MG/ML  SOLN  COMPARISON:  None.  FINDINGS: Lower chest: Patchy right middle and lower lobe opacities, suspicious for pneumonia. Associated small right pleural effusion.  Hepatobiliary: 11 mm probable cyst in the medial segment left hepatic lobe (series 2/ image 23).  Gallbladder is unremarkable. No intrahepatic or extrahepatic ductal dilatation.  Pancreas: Within normal limits.  Spleen: Within normal limits.  Adrenals/Urinary Tract: Adrenal glands are within normal limits.  16 mm cyst in the lateral interpolar left kidney (series 2/ image 29). Right kidney is within normal limits. No hydronephrosis.  Bladder is within normal limits.  Stomach/Bowel: Stomach is within normal limits.  No evidence of bowel obstruction.  Prior appendectomy.  Mild colonic diverticulosis, without evidence of diverticulitis.  Vascular/Lymphatic: Atherosclerotic calcifications of the abdominal aorta and branch vessels.  No suspicious abdominopelvic lymphadenopathy.  Reproductive: Prostate is notable for dystrophic calcifications.  Other: No abdominopelvic ascites.  Postsurgical changes related to prior right inguinal hernia repair.  Musculoskeletal: Degenerative changes of the visualized thoracolumbar spine.  IMPRESSION: Patchy right middle and  lower lobe opacities, suspicious for pneumonia. Associated small right pleural effusion.  No evidence of bowel obstruction.  Prior appendectomy.   Electronically Signed   By: Julian Hy M.D.   On: 06/07/2014 20:23   Dg Chest Port 1 View  06/07/2014   CLINICAL DATA:  PATIENT ARRIEVED TO ER BY EMS, RIGHT SIDE/FLANK PAIN STARTED TODAY AND SOB TODAY. BODY ACHES X 1 WEEK. LEFT SHOULDER PAIN, NAUSEA YESTERDAY. PATIENT STATES " HURTS TO BREATHE" STATES " HE SMOKES A  PIPE" HISTORY OF HTN  EXAM: PORTABLE CHEST - 1 VIEW  COMPARISON:  None  FINDINGS: There is basilar opacity, most evident on right, which may all be atelectasis. Pneumonia is possible. There is no evidence of pulmonary edema. No pleural effusion or pneumothorax.  Cardiac silhouette is normal in size. No mediastinal or hilar masses. Bony thorax is demineralized but grossly intact.  IMPRESSION: Right greater than left lung base opacity. Atelectasis/scarring suspected. Infiltrate is possible. No pulmonary edema.   Electronically Signed   By: Lajean Manes M.D.   On: 06/07/2014 18:18    Lab Results: Basic Metabolic Panel:  Recent Labs  06/09/14 0753 06/10/14 0621  NA 137 141  K 3.9 3.9  CL 105 108  CO2 25 24  GLUCOSE 115* 121*  BUN 21 21  CREATININE 1.30 1.21  CALCIUM 8.0* 8.2*   Liver Function Tests: No results for input(s): AST, ALT, ALKPHOS, BILITOT, PROT, ALBUMIN in the last 72 hours.   CBC:  Recent Labs  06/10/14 0621  WBC 18.0*  NEUTROABS 15.6*  HGB 12.7*  HCT 37.9*  MCV 93.6  PLT 271    Recent Results (from the past 240 hour(s))  Culture, blood (routine x 2) Call MD if unable to obtain prior to antibiotics being given     Status: None (Preliminary result)   Collection Time: 06/07/14  9:53 PM  Result Value Ref Range Status   Specimen Description BLOOD RIGHT ANTECUBITAL  Final   Special Requests BOTTLES DRAWN AEROBIC AND ANAEROBIC 6CC  Final   Culture NO GROWTH 3 DAYS  Final   Report Status PENDING  Incomplete  Culture, blood (routine x 2) Call MD if unable to obtain prior to antibiotics being given     Status: None (Preliminary result)   Collection Time: 06/07/14 10:03 PM  Result Value Ref Range Status   Specimen Description BLOOD RIGHT HAND  Final   Special Requests BOTTLES DRAWN AEROBIC ONLY [place]  Final   Culture NO GROWTH 3 DAYS  Final   Report Status PENDING  Incomplete  Culture, sputum-assessment     Status: None   Collection Time: 06/10/14  7:14 AM  Result  Value Ref Range Status   Specimen Description SPUTUM EXPECTORATED  Final   Special Requests NONE  Final   Sputum evaluation   Final    THIS SPECIMEN IS ACCEPTABLE. RESPIRATORY CULTURE REPORT TO FOLLOW. Performed at [facility]    Report Status 06/10/2014 FINAL  Final  Culture, respiratory (NON-Expectorated)     Status: None (Preliminary result)   Collection Time: 06/10/14  7:14 AM  Result Value Ref Range Status   Specimen Description SPUTUM EXPECTORATED  Final   Special Requests NONE  Final   Gram Stain PENDING  Incomplete   Culture   Final    Culture reincubated for better growth Performed at [facility]: He was admitted with cough and congestion. He began  having chest discomfort and eventually came to the emergency room for evaluation. He underwent CT scan which showed patchy infiltrates consistent with pneumonia. He appeared to have acute kidney injury with elevation of his BUN and creatinine over his baseline. He was mildly hypotensive and hypoxic and was felt to have SIRS. He was started on treatment with antibiotics IV fluids etc. oxygen and inhaled bronchodilators. He improved rapidly and by the time of discharge he was no longer hypoxic his blood pressure was normal and he was able to ambulate in the halls  Discharge Exam: Blood pressure 172/73, pulse 94, temperature 98.4 F (36.9 C), temperature source Oral, resp. rate 20, height 5\' 11"  (1.803 m), weight 88.451 kg (195 lb), SpO2 95 %. He still has some rales in the right base. His chest is clear. His heart is regular.  Disposition: Home he will be seen in my office next week he will need follow-up chest x-ray      Discharge Instructions    Discharge patient    Complete by:  As directed            Follow-up Information    Follow up with Razia Screws L, MD. Schedule an appointment as soon as possible for a visit in 1 week.   Specialty:   Pulmonary Disease   Contact information:   [place] [address] [phone]       Signed: Tityana Pagan L   06/11/2014, 7:31 AM

## 2014-06-11 NOTE — Telephone Encounter (Signed)
Letter mailed to pt.  

## 2014-06-12 ENCOUNTER — Other Ambulatory Visit (HOSPITAL_COMMUNITY): Payer: Self-pay | Admitting: Pulmonary Disease

## 2014-06-12 ENCOUNTER — Ambulatory Visit (HOSPITAL_COMMUNITY)
Admission: RE | Admit: 2014-06-12 | Discharge: 2014-06-12 | Disposition: A | Discharge status: Home / Self Care | Payer: Medicare Other | Source: Ambulatory Visit | Attending: Pulmonary Disease | Admitting: Pulmonary Disease

## 2014-06-12 DIAGNOSIS — J189 Pneumonia, unspecified organism: Secondary | ICD-10-CM

## 2014-06-12 DIAGNOSIS — R509 Fever, unspecified: Secondary | ICD-10-CM | POA: Diagnosis not present

## 2014-06-12 LAB — CULTURE, BLOOD (ROUTINE X 2)
Culture: NO GROWTH
Culture: NO GROWTH

## 2014-06-13 LAB — CULTURE, RESPIRATORY W GRAM STAIN: Culture: NORMAL

## 2014-06-13 LAB — CULTURE, RESPIRATORY

## 2014-06-19 DIAGNOSIS — J449 Chronic obstructive pulmonary disease, unspecified: Secondary | ICD-10-CM | POA: Diagnosis not present

## 2014-06-19 DIAGNOSIS — J189 Pneumonia, unspecified organism: Secondary | ICD-10-CM | POA: Diagnosis not present

## 2014-06-24 ENCOUNTER — Other Ambulatory Visit (HOSPITAL_COMMUNITY): Payer: Self-pay | Admitting: Pulmonary Disease

## 2014-06-24 DIAGNOSIS — J189 Pneumonia, unspecified organism: Secondary | ICD-10-CM

## 2014-06-26 ENCOUNTER — Encounter (HOSPITAL_COMMUNITY): Payer: Self-pay | Admitting: *Deleted

## 2014-06-26 ENCOUNTER — Institutional Professional Consult (permissible substitution) (INDEPENDENT_AMBULATORY_CARE_PROVIDER_SITE_OTHER): Payer: Medicare Other | Admitting: Cardiothoracic Surgery

## 2014-06-26 ENCOUNTER — Other Ambulatory Visit: Payer: Self-pay

## 2014-06-26 ENCOUNTER — Inpatient Hospital Stay (HOSPITAL_COMMUNITY)
Admission: AD | Admit: 2014-06-26 | Discharge: 2014-07-04 | DRG: 163 | Disposition: A | Discharge status: Home / Self Care | Payer: Medicare Other | Source: Ambulatory Visit | Attending: Cardiothoracic Surgery | Admitting: Cardiothoracic Surgery

## 2014-06-26 ENCOUNTER — Inpatient Hospital Stay: Admission: AD | Admit: 2014-06-26 | Payer: Self-pay | Source: Ambulatory Visit | Admitting: Cardiothoracic Surgery

## 2014-06-26 ENCOUNTER — Encounter: Payer: Self-pay | Admitting: Cardiothoracic Surgery

## 2014-06-26 ENCOUNTER — Ambulatory Visit (HOSPITAL_COMMUNITY)
Admission: RE | Admit: 2014-06-26 | Discharge: 2014-06-26 | Disposition: A | Discharge status: Home / Self Care | Payer: Medicare Other | Source: Ambulatory Visit | Attending: Pulmonary Disease | Admitting: Pulmonary Disease

## 2014-06-26 VITALS — BP 150/84 | HR 115 | Resp 20 | Ht 71.0 in | Wt 193.0 lb

## 2014-06-26 DIAGNOSIS — R091 Pleurisy: Secondary | ICD-10-CM | POA: Diagnosis not present

## 2014-06-26 DIAGNOSIS — Z8701 Personal history of pneumonia (recurrent): Secondary | ICD-10-CM

## 2014-06-26 DIAGNOSIS — J189 Pneumonia, unspecified organism: Secondary | ICD-10-CM | POA: Diagnosis present

## 2014-06-26 DIAGNOSIS — F17210 Nicotine dependence, cigarettes, uncomplicated: Secondary | ICD-10-CM | POA: Diagnosis present

## 2014-06-26 DIAGNOSIS — R5383 Other fatigue: Secondary | ICD-10-CM

## 2014-06-26 DIAGNOSIS — Z452 Encounter for adjustment and management of vascular access device: Secondary | ICD-10-CM | POA: Diagnosis not present

## 2014-06-26 DIAGNOSIS — R6510 Systemic inflammatory response syndrome (SIRS) of non-infectious origin without acute organ dysfunction: Secondary | ICD-10-CM | POA: Diagnosis present

## 2014-06-26 DIAGNOSIS — J9811 Atelectasis: Secondary | ICD-10-CM | POA: Diagnosis not present

## 2014-06-26 DIAGNOSIS — N400 Enlarged prostate without lower urinary tract symptoms: Secondary | ICD-10-CM | POA: Diagnosis present

## 2014-06-26 DIAGNOSIS — I10 Essential (primary) hypertension: Secondary | ICD-10-CM | POA: Diagnosis present

## 2014-06-26 DIAGNOSIS — J869 Pyothorax without fistula: Principal | ICD-10-CM | POA: Diagnosis present

## 2014-06-26 DIAGNOSIS — J90 Pleural effusion, not elsewhere classified: Secondary | ICD-10-CM | POA: Diagnosis not present

## 2014-06-26 DIAGNOSIS — Z825 Family history of asthma and other chronic lower respiratory diseases: Secondary | ICD-10-CM

## 2014-06-26 DIAGNOSIS — B954 Other streptococcus as the cause of diseases classified elsewhere: Secondary | ICD-10-CM | POA: Diagnosis present

## 2014-06-26 DIAGNOSIS — I2510 Atherosclerotic heart disease of native coronary artery without angina pectoris: Secondary | ICD-10-CM | POA: Diagnosis not present

## 2014-06-26 DIAGNOSIS — R634 Abnormal weight loss: Secondary | ICD-10-CM | POA: Insufficient documentation

## 2014-06-26 DIAGNOSIS — R05 Cough: Secondary | ICD-10-CM | POA: Diagnosis not present

## 2014-06-26 DIAGNOSIS — Z9689 Presence of other specified functional implants: Secondary | ICD-10-CM

## 2014-06-26 DIAGNOSIS — J984 Other disorders of lung: Secondary | ICD-10-CM | POA: Diagnosis not present

## 2014-06-26 DIAGNOSIS — Z4682 Encounter for fitting and adjustment of non-vascular catheter: Secondary | ICD-10-CM | POA: Diagnosis not present

## 2014-06-26 LAB — URINALYSIS, ROUTINE W REFLEX MICROSCOPIC
Bilirubin Urine: NEGATIVE
Glucose, UA: NEGATIVE mg/dL
Hgb urine dipstick: NEGATIVE
Ketones, ur: NEGATIVE mg/dL
Leukocytes, UA: NEGATIVE
Nitrite: NEGATIVE
Protein, ur: 100 mg/dL — AB
Specific Gravity, Urine: >1.046 — ABNORMAL HIGH (ref 1.005–1.030)
Urobilinogen, UA: 1 mg/dL (ref 0.0–1.0)
pH: 5 (ref 5.0–8.0)

## 2014-06-26 LAB — CBC WITH DIFFERENTIAL/PLATELET
Basophils Absolute: 0 10*3/uL (ref 0.0–0.1)
Basophils Relative: 0 % (ref 0–1)
Eosinophils Absolute: 0 10*3/uL (ref 0.0–0.7)
Eosinophils Relative: 0 % (ref 0–5)
HCT: 33.1 % — ABNORMAL LOW (ref 39.0–52.0)
Hemoglobin: 11.2 g/dL — ABNORMAL LOW (ref 13.0–17.0)
Lymphocytes Relative: 6 % — ABNORMAL LOW (ref 12–46)
Lymphs Abs: 1.1 10*3/uL (ref 0.7–4.0)
MCH: 30.7 pg (ref 26.0–34.0)
MCHC: 33.8 g/dL (ref 30.0–36.0)
MCV: 90.7 fL (ref 78.0–100.0)
Monocytes Absolute: 1.7 10*3/uL — ABNORMAL HIGH (ref 0.1–1.0)
Monocytes Relative: 9 % (ref 3–12)
Neutro Abs: 15.8 10*3/uL — ABNORMAL HIGH (ref 1.7–7.7)
Neutrophils Relative %: 85 % — ABNORMAL HIGH (ref 43–77)
Platelets: 593 10*3/uL — ABNORMAL HIGH (ref 150–400)
RBC: 3.65 MIL/uL — ABNORMAL LOW (ref 4.22–5.81)
RDW: 13.7 % (ref 11.5–15.5)
WBC: 18.7 10*3/uL — ABNORMAL HIGH (ref 4.0–10.5)

## 2014-06-26 LAB — BLOOD GAS, ARTERIAL
Acid-base deficit: 0.7 mmol/L (ref 0.0–2.0)
Bicarbonate: 22 mEq/L (ref 20.0–24.0)
Drawn by: 313941
O2 Content: 3 L/min
O2 Saturation: 91.5 %
Patient temperature: 101.2
TCO2: 22.8 mmol/L (ref 0–100)
pCO2 arterial: 29.5 mmHg — ABNORMAL LOW (ref 35.0–45.0)
pH, Arterial: 7.491 — ABNORMAL HIGH (ref 7.350–7.450)
pO2, Arterial: 62.3 mmHg — ABNORMAL LOW (ref 80.0–100.0)

## 2014-06-26 LAB — COMPREHENSIVE METABOLIC PANEL
ALT: 61 U/L — ABNORMAL HIGH (ref 0–53)
AST: 38 U/L — ABNORMAL HIGH (ref 0–37)
Albumin: 2.1 g/dL — ABNORMAL LOW (ref 3.5–5.2)
Alkaline Phosphatase: 78 U/L (ref 39–117)
Anion gap: 14 (ref 5–15)
BUN: 20 mg/dL (ref 6–23)
CO2: 20 mmol/L (ref 19–32)
Calcium: 8.3 mg/dL — ABNORMAL LOW (ref 8.4–10.5)
Chloride: 100 mmol/L (ref 96–112)
Creatinine, Ser: 1.35 mg/dL (ref 0.50–1.35)
GFR calc Af Amer: 60 mL/min — ABNORMAL LOW (ref >90)
GFR calc non Af Amer: 52 mL/min — ABNORMAL LOW (ref >90)
Glucose, Bld: 119 mg/dL — ABNORMAL HIGH (ref 70–99)
Potassium: 4.9 mmol/L (ref 3.5–5.1)
Sodium: 134 mmol/L — ABNORMAL LOW (ref 135–145)
Total Bilirubin: 0.7 mg/dL (ref 0.3–1.2)
Total Protein: 7.1 g/dL (ref 6.0–8.3)

## 2014-06-26 LAB — TYPE AND SCREEN
ABO/RH(D): A POS
Antibody Screen: NEGATIVE

## 2014-06-26 LAB — URINE MICROSCOPIC-ADD ON

## 2014-06-26 LAB — LACTIC ACID, PLASMA
Lactic Acid, Venous: 1.6 mmol/L (ref 0.5–2.0)
Lactic Acid, Venous: 1.1 mmol/L (ref 0.5–2.0)

## 2014-06-26 LAB — PROCALCITONIN: Procalcitonin: 0.34 ng/mL

## 2014-06-26 LAB — PROTIME-INR
INR: 1.17 (ref 0.00–1.49)
Prothrombin Time: 15.1 seconds (ref 11.6–15.2)

## 2014-06-26 LAB — MRSA PCR SCREENING: MRSA by PCR: NEGATIVE

## 2014-06-26 LAB — ABO/RH: ABO/RH(D): A POS

## 2014-06-26 LAB — APTT: aPTT: 36 seconds (ref 24–37)

## 2014-06-26 MED ORDER — CETYLPYRIDINIUM CHLORIDE 0.05 % MT LIQD
7.0000 mL | Freq: Two times a day (BID) | OROMUCOSAL | Status: DC
  Administered 2014-06-26: 7 mL via OROMUCOSAL

## 2014-06-26 MED ORDER — DEXTROSE-NACL 5-0.45 % IV SOLN
INTRAVENOUS | Status: DC
  Administered 2014-06-26: 21:00:00 via INTRAVENOUS

## 2014-06-26 MED ORDER — SODIUM CHLORIDE 0.9 % IJ SOLN: 10.0000 mL | INTRAMUSCULAR | Status: DC | PRN

## 2014-06-26 MED ORDER — VANCOMYCIN HCL IN DEXTROSE 750-5 MG/150ML-% IV SOLN
750.0000 mg | Freq: Two times a day (BID) | INTRAVENOUS | Status: DC
  Administered 2014-06-27 – 2014-06-30 (×7): 750 mg via INTRAVENOUS
  Filled 2014-06-26 (×9): qty 150

## 2014-06-26 MED ORDER — VANCOMYCIN HCL IN DEXTROSE 1-5 GM/200ML-% IV SOLN
1000.0000 mg | Freq: Once | INTRAVENOUS | Status: DC
Start: 2014-06-26 — End: 2014-06-26

## 2014-06-26 MED ORDER — PIPERACILLIN-TAZOBACTAM 3.375 G IVPB 30 MIN: 3.3750 g | Freq: Once | INTRAVENOUS | Status: DC

## 2014-06-26 MED ORDER — PIPERACILLIN-TAZOBACTAM 3.375 G IVPB
3.3750 g | Freq: Three times a day (TID) | INTRAVENOUS | Status: DC
  Administered 2014-06-26 – 2014-07-04 (×23): 3.375 g via INTRAVENOUS
  Filled 2014-06-26 (×28): qty 50

## 2014-06-26 MED ORDER — VANCOMYCIN HCL IN DEXTROSE 1-5 GM/200ML-% IV SOLN
1000.0000 mg | Freq: Two times a day (BID) | INTRAVENOUS | Status: DC
  Administered 2014-06-26: 1000 mg via INTRAVENOUS
  Filled 2014-06-26 (×3): qty 200

## 2014-06-26 MED ORDER — IOHEXOL 300 MG/ML  SOLN
80.0000 mL | Freq: Once | INTRAMUSCULAR | Status: AC | PRN
  Administered 2014-06-26: 80 mL via INTRAVENOUS

## 2014-06-26 MED ORDER — SODIUM CHLORIDE 0.9 % IV SOLN
INTRAVENOUS | Status: DC
  Administered 2014-06-26: 19:00:00 via INTRAVENOUS

## 2014-06-26 MED ORDER — OXYCODONE HCL 5 MG PO TABS
5.0000 mg | ORAL_TABLET | ORAL | Status: DC | PRN
  Administered 2014-06-26 – 2014-06-27 (×3): 5 mg via ORAL
  Filled 2014-06-26 (×3): qty 1

## 2014-06-26 MED ORDER — CHLORHEXIDINE GLUCONATE CLOTH 2 % EX PADS: 6.0000 | MEDICATED_PAD | Freq: Once | CUTANEOUS | Status: DC

## 2014-06-26 MED ORDER — CHLORHEXIDINE GLUCONATE CLOTH 2 % EX PADS
6.0000 | MEDICATED_PAD | Freq: Once | CUTANEOUS | Status: AC
  Administered 2014-06-26: 6 via TOPICAL

## 2014-06-26 MED ORDER — SODIUM CHLORIDE 0.9 % IJ SOLN
10.0000 mL | Freq: Two times a day (BID) | INTRAMUSCULAR | Status: DC
  Administered 2014-06-26: 10 mL

## 2014-06-26 NOTE — Progress Notes (Addendum)
ANTIBIOTIC CONSULT NOTE - INITIAL  Pharmacy Consult for vancomycin and zosyn Indication: Sepsis/empyema  No Known Allergies   Vital Signs: BP: 150/84 mmHg (04/07 1455) Pulse Rate: 115 (04/07 1455) Intake/Output from previous day:   Intake/Output from this shift:    Labs: No results for input(s): WBC, HGB, PLT, LABCREA, CREATININE in the last 72 hours. Estimated Creatinine Clearance: 61.4 mL/min (by C-G formula based on Cr of 1.21). No results for input(s): VANCOTROUGH, VANCOPEAK, VANCORANDOM, GENTTROUGH, GENTPEAK, GENTRANDOM, TOBRATROUGH, TOBRAPEAK, TOBRARND, AMIKACINPEAK, AMIKACINTROU, AMIKACIN in the last 72 hours.   Microbiology: Recent Results (from the past 720 hour(s))  Culture, blood (routine x 2) Call MD if unable to obtain prior to antibiotics being given     Status: None   Collection Time: 06/07/14  9:53 PM  Result Value Ref Range Status   Specimen Description BLOOD RIGHT ANTECUBITAL  Final   Special Requests BOTTLES DRAWN AEROBIC AND ANAEROBIC 6CC  Final   Culture NO GROWTH 5 DAYS  Final   Report Status 06/12/2014 FINAL  Final  Culture, blood (routine x 2) Call MD if unable to obtain prior to antibiotics being given     Status: None   Collection Time: 06/07/14 10:03 PM  Result Value Ref Range Status   Specimen Description BLOOD RIGHT HAND  Final   Special Requests BOTTLES DRAWN AEROBIC ONLY [place]  Final   Culture NO GROWTH 5 DAYS  Final   Report Status 06/12/2014 FINAL  Final  Culture, sputum-assessment     Status: None   Collection Time: 06/10/14  7:14 AM  Result Value Ref Range Status   Specimen Description SPUTUM EXPECTORATED  Final   Special Requests NONE  Final   Sputum evaluation   Final    THIS SPECIMEN IS ACCEPTABLE. RESPIRATORY CULTURE REPORT TO FOLLOW. Performed at [facility]    Report Status 06/10/2014 FINAL  Final  Culture, respiratory (NON-Expectorated)     Status: None   Collection Time: 06/10/14  7:14 AM  Result Value Ref Range Status    Specimen Description SPUTUM EXPECTORATED  Final   Special Requests NONE  Final   Gram Stain   Final    MODERATE WBC PRESENT, PREDOMINANTLY PMN MODERATE SQUAMOUS EPITHELIAL CELLS PRESENT ABUNDANT GRAM POSITIVE COCCI IN PAIRS IN CHAINS FEW GRAM NEGATIVE RODS FEW GRAM POSITIVE RODS    Culture   Final    NORMAL OROPHARYNGEAL FLORA Performed at [facility]    Report Status 06/13/2014 FINAL  Final    Medical History: Past Medical History  Diagnosis Date  . Hypertension   . Urinary frequency   . Low serum testosterone level    Assessment: 69 y.o. male is seen in CVTS office today found to have pleural effusion and empyema on chest CT patient admitted for surgical evaluation. Empiric antibiotics starting for possible sepsis.   No baseline temp taken yet, awaiting baseline labs. Last scr on 3/22 was 1.2.   Goal of Therapy:  Vancomycin trough level 15-20 mcg/ml  Plan:  Measure antibiotic drug levels at steady state Follow up culture results Zosyn 3.375g IV q8 hours  Vancomycin 1g IV q12 hours Await baseline labs - if scr elevated will adjust dosing as needed.  Erin Hearing PharmD., BCPS Clinical Pharmacist Pager [phone] 06/26/2014 4:38 PM  Scr 1.3 - slightly elevated, will reduce vancomycin to 750 q12 (intial 1g received)  06/26/2014 8:33 PM

## 2014-06-26 NOTE — Progress Notes (Signed)
Patient ID: [name] [name], male   DOB: 26-May-1945, 69 y.o.   MRN: [id number] EVENING ROUNDS NOTE :     [address]       [place] [address]             [phone]                   Procedure(s) (LRB): VIDEO BRONCHOSCOPY (N/A) VIDEO ASSISTED THORACOSCOPY (Right) EMPYEMA DRAINAGE (Right)  Total Length of Stay:  LOS: 0 days  BP 161/76 mmHg  Pulse 128  Temp(Src) 101.2 F (38.4 C) (Oral)  Resp 36  Ht 5\' 11"  (1.803 m)  Wt 197 lb 15.6 oz (89.8 kg)  BMI 27.62 kg/m2  SpO2 94%  .Intake/Output    None     . dextrose 5 % and 0.45% NaCl       Lab Results  Component Value Date   WBC 18.7* 06/26/2014   HGB 11.2* 06/26/2014   HCT 33.1* 06/26/2014   PLT 593* 06/26/2014   GLUCOSE 121* 06/10/2014   ALT 14 06/07/2014   AST 13 06/07/2014   NA 141 06/10/2014   K 3.9 06/10/2014   CL 108 06/10/2014   CREATININE 1.21 06/10/2014   BUN 21 06/10/2014   CO2 24 06/10/2014   Now in icu BP 161/76 mmHg  Pulse 128  Temp(Src) 101.2 F (38.4 C) (Oral)  Resp 36  Ht 5\' 11"  (1.803 m)  Wt 197 lb 15.6 oz (89.8 kg)  BMI 27.62 kg/m2  SpO2 94% Alert neuro intact, cultures done ,iv running and antibiotics started     Grace Isaac MD  [place] Office [phone] 06/26/2014 5:59 PM

## 2014-06-26 NOTE — H&P (Signed)
[name]       [name] [id number]             [phone]                    [name] Date of Birth: Mar 25, 1945  Referring: No ref. provider found Primary Care: [name] Bogus, MD  Chief Complaint:    No chief complaint on file.   History of Present Illness:    [name] 69 y.o. male is seen in the office  today for at the request of Dr. Luan [name]. The patient gives a history of low-grade fever starting March 19 by March 20 he had developed increasing right chest pain. And shortness of breath. He was admitted to [name] scan of the abdomen was done, no CT of the chest. He was treated for pneumonia and discharged home on March 22 on Levaquin. He stopped the Levaquin approximately 10 days ago. Since he has become progressively  short of breath right chest discomfort low-grade fever and chills. He arranged to see Dr. Tawnya [name] 2 performed a CT scan of the chest and referred him immediately to thoracic surgery.  The patient gives no previous history of cardiac disease or history of pulmonary emboli  Current Activity/ Functional Status:  Patient is independent with mobility/ambulation, transfers, ADL's, IADL's.   Zubrod Score: At the time of surgery this patient's most appropriate activity status/level should be described as: [x]     0    Normal activity, no symptoms []     1    Restricted in physical strenuous activity but ambulatory, able to do out light work []     2    Ambulatory and capable of self care, unable to do work activities, up and about               >50 % of waking hours                              []     3    Only limited self care, in bed greater than 50% of waking hours []     4    Completely disabled, no self care, confined to bed or chair []     5    Moribund   Past Medical History  Diagnosis Date  . Hypertension   . Urinary frequency   . Low serum testosterone level     Past  Surgical History  Procedure Laterality Date  . Hernia repair    . Appendectomy      Family History  Problem Relation Age of Onset  . Tuberculosis Father   . Hypertension Father   . COPD Mother   . Hypertension Mother     History   Social History  . Marital Status: Married    Spouse Name: N/A  . Number of Children: N/A  . Years of Education: N/A   Occupational History  . Not on file.   Social History Main Topics  . Smoking status: Current Some Day Smoker    Types: Pipe  . Smokeless tobacco: Not on file  . Alcohol Use: Yes     Comment: occasionally  . Drug Use: No  . Sexual Activity: Not on file   Other Topics Concern  . Not on file   Social History Narrative    History  Smoking status  .  Current Some Day Smoker  . Types: Pipe  Smokeless tobacco  . Not on file    History  Alcohol Use  . Yes    Comment: occasionally     No Known Allergies  Current Facility-Administered Medications  Medication Dose Route Frequency Provider Last Rate Last Dose  . Chlorhexidine Gluconate Cloth 2 % PADS 6 each  6 each Topical Once [name] Isaac, MD       And  . Chlorhexidine Gluconate Cloth 2 % PADS 6 each  6 each Topical Once [name] [facility], MD      . dextrose 5 %-0.45 % sodium chloride infusion   Intravenous Continuous [name] [facility], MD      . piperacillin-tazobactam (ZOSYN) IVPB 3.375 g  3.375 g Intravenous Q8H [name], [name]      . vancomycin (VANCOCIN) IVPB 1000 mg/200 mL premix  1,000 mg Intravenous Q12H [name], [name]          Review of Systems:     Cardiac Review of Systems: Y or N  Chest Pain [  y  ]  Resting SOB [  y ] Exertional SOB  Blue.Reese  ]  Orthopnea [ y ]   Pedal Edema [n   ]    Palpitations [name]  ] Syncope  [ n ]   Presyncope [  n ]  General Review of Systems: [Y] = yes [  ]=no Constitional: recent weight change [  ];  Wt loss over the last 3 months [   ] anorexia [  ]; fatigue [  ]; nausea [  ]; night sweats [ y ]; fever Blue.Reese  ]; or  chills Blue.Reese  ];          Dental: poor dentition[ n ]; Last Dentist visit:   Eye : blurred vision [  ]; diplopia [   ]; vision changes [  ];  Amaurosis fugax[  ]; Resp: cough [  ];  wheezing[ y ];  hemoptysis[n  ]; shortness of breath[ y ]; paroxysmal nocturnal dyspnea[ y ]; dyspnea on exertion[ y ]; or orthopnea[y  ];  GI:  gallstones[  ], vomiting[  ];  dysphagia[  ]; melena[  ];  hematochezia [  ]; heartburn[  ];   Hx of  Colonoscopy[  ]; GU: kidney stones [  ]; hematuria[  ];   dysuria [  ];  nocturia[  ];  history of     obstruction [  ]; urinary frequency [  ]             Skin: rash, swelling[  ];, hair loss[  ];  peripheral edema[  ];  or itching[  ]; Musculosketetal: myalgias[  ];  joint swelling[  ];  joint erythema[  ];  joint pain[  ];  back pain[  ];  Heme/Lymph: bruising[  ];  bleeding[  ];  anemia[  ];  Neuro: TIA[n  ];  headaches[  ];  stroke[  ];  vertigo[  ];  seizures[  ];   paresthesias[  ];  difficulty walking[  ];  Psych:depression[  ]; anxiety[  ];  Endocrine: diabetes[  ];  thyroid dysfunction[  ];  Immunizations: Flu up to date [ y ]; Pneumococcal up to date Blue.Reese  ];  Other:  Physical Exam: BP 161/76 mmHg  Pulse 128  Temp(Src) 101.2 F (38.4 C) (Oral)  Resp 36  Ht 5\' 11"  (1.803 m)  Wt 197 lb 15.6 oz (89.8 kg)  BMI 27.62 kg/m2  SpO2 94%  PHYSICAL EXAMINATION: General appearance: alert, cooperative and moderate distress Head: Normocephalic, without obvious abnormality, atraumatic Neck: no adenopathy, no carotid bruit, no JVD, supple, symmetrical, trachea midline and thyroid not enlarged, symmetric, no tenderness/mass/nodules Lymph nodes: Cervical, supraclavicular, and axillary nodes normal. Resp: diminished breath sounds RLL and RML Back: symmetric, no curvature. ROM normal. No CVA tenderness. Cardio: regular rate and rhythm, S1, S2 normal, no murmur, click, rub or gallop GI: soft, non-tender; bowel sounds normal; no masses,  no organomegaly Extremities:  extremities normal, atraumatic, no cyanosis or edema and Homans sign is negative, no sign of DVT Neurologic: Grossly normal Patient has 1+ DP and PT pulses bilaterally  Diagnostic Studies & Laboratory data:     Recent Radiology Findings:   Dg Chest 2 View  06/12/2014   CLINICAL DATA:  History pneumonia, followup, fever  EXAM: CHEST  2 VIEW  COMPARISON:  Portable chest x-ray 06/07/2014 and CT abdomen and pelvis of the same day  FINDINGS: The right lower lobe parenchymal opacity and small right pleural effusion noted on CT of the abdomen pelvis appears to remain with persistent pleural and parenchymal opacity at the right lung base. The left lung is clear. Mediastinal and hilar contours are unremarkable. The bulging of the left mediastinal contour is unchanged and probably is due to a prominent pulmonary artery segment. The heart is unchanged in size. There are degenerative changes in the lower thoracic spine.  IMPRESSION: Persistent pleural and parenchymal opacity at the right lung base most consistent with atelectasis, pneumonia, and right pleural effusion.   Electronically Signed   By: Ivar Drape M.D.   On: 06/12/2014 15:27   Ct Chest W Contrast  06/26/2014   CLINICAL DATA:  Recent pneumonia of unspecified organism, continues to be tired and easily out of breath, weight loss, history hypertension  EXAM: CT CHEST WITH CONTRAST  TECHNIQUE: Multidetector CT imaging of the chest was performed during intravenous contrast administration. Sagittal and coronal MPR images reconstructed from axial data set.  CONTRAST:  19mL OMNIPAQUE IOHEXOL 300 MG/ML  SOLN IV  COMPARISON:  None ; correlation chest radiograph 06/12/2014 and CT abdomen/pelvis 06/07/2014  FINDINGS: Thoracic vascular structures grossly patent on nondedicated exam.  Numerous normal sized thoracic lymph nodes.  Single minimally enlarged precarinal lymph node 12 mm short axis image 34.  Scattered atherosclerotic calcification of aorta and coronary  arteries.  Mild thickening of adrenal glands bilaterally without discrete mass.  Small hepatic and LEFT renal cysts unchanged.  Remaining visualized portion of upper abdomen normal.  Large RIGHT pleural effusion, partially loculated.  Loculation at the posterior medial RIGHT hemi thorax appears walled off with slightly higher attenuation and mild pleural thickening, unable to exclude empyema, collection measuring 7.6 x 4.1 x 8.4 cm.  Significant atelectasis of the RIGHT lung greatest in lower lobe.  Questionable tiny nodules LEFT lower lobe images 54-56.  LEFT lung otherwise clear.  No pneumothorax.  Osseous structures unremarkable.  IMPRESSION: Large RIGHT pleural effusion which is partially loculated.  Additional loculated collection at the posterior inferior RIGHT hemithorax medially measuring 7.6 x 4.1 x 8.4 cm in size, demonstrating slightly increased attenuation versus remaining pleural fluid and thicker margins, cannot exclude loculated empyema.  Significant atelectasis of RIGHT lung.  Tiny questionable LEFT lower lobe nodules ; recommendation below.  If the patient is at high risk for bronchogenic carcinoma, follow-up chest CT at 1 year is recommended. If the patient is at low risk, no follow-up is needed.  This recommendation follows the consensus statement: Guidelines for Management of Small Pulmonary Nodules Detected on CT Scans: A Statement from the [employer] as published in Radiology 2005; 237:395-400.  Findings called to Dr. Luan [name] on 06/26/2014 at 1112 hours.   Electronically Signed   By: Lavonia Dana M.D.   On: 06/26/2014 11:16   Ct Abdomen Pelvis W Contrast  06/07/2014   CLINICAL DATA:  Right flank pain, generalized body aches, fever, shortness of breath  EXAM: CT ABDOMEN AND PELVIS WITH CONTRAST  TECHNIQUE: Multidetector CT imaging of the abdomen and pelvis was performed using the standard protocol following bolus administration of intravenous contrast.  CONTRAST:  153mL OMNIPAQUE  IOHEXOL 300 MG/ML  SOLN  COMPARISON:  None.  FINDINGS: Lower chest: Patchy right middle and lower lobe opacities, suspicious for pneumonia. Associated small right pleural effusion.  Hepatobiliary: 11 mm probable cyst in the medial segment left hepatic lobe (series 2/ image 23).  Gallbladder is unremarkable. No intrahepatic or extrahepatic ductal dilatation.  Pancreas: Within normal limits.  Spleen: Within normal limits.  Adrenals/Urinary Tract: Adrenal glands are within normal limits.  16 mm cyst in the lateral interpolar left kidney (series 2/ image 29). Right kidney is within normal limits. No hydronephrosis.  Bladder is within normal limits.  Stomach/Bowel: Stomach is within normal limits.  No evidence of bowel obstruction.  Prior appendectomy.  Mild colonic diverticulosis, without evidence of diverticulitis.  Vascular/Lymphatic: Atherosclerotic calcifications of the abdominal aorta and branch vessels.  No suspicious abdominopelvic lymphadenopathy.  Reproductive: Prostate is notable for dystrophic calcifications.  Other: No abdominopelvic ascites.  Postsurgical changes related to prior right inguinal hernia repair.  Musculoskeletal: Degenerative changes of the visualized thoracolumbar spine.  IMPRESSION: Patchy right middle and lower lobe opacities, suspicious for pneumonia. Associated small right pleural effusion.  No evidence of bowel obstruction.  Prior appendectomy.   Electronically Signed   By: Julian Hy M.D.   On: 06/07/2014 20:23   Dg Chest Port 1 View  06/07/2014   CLINICAL DATA:  PATIENT ARRIEVED TO ER BY EMS, RIGHT SIDE/FLANK PAIN STARTED TODAY AND SOB TODAY. BODY ACHES X 1 WEEK. LEFT SHOULDER PAIN, NAUSEA YESTERDAY. PATIENT STATES " HURTS TO BREATHE" STATES " HE SMOKES A PIPE" HISTORY OF HTN  EXAM: PORTABLE CHEST - 1 VIEW  COMPARISON:  None  FINDINGS: There is basilar opacity, most evident on right, which may all be atelectasis. Pneumonia is possible. There is no evidence of pulmonary edema.  No pleural effusion or pneumothorax.  Cardiac silhouette is normal in size. No mediastinal or hilar masses. Bony thorax is demineralized but grossly intact.  IMPRESSION: Right greater than left lung base opacity. Atelectasis/scarring suspected. Infiltrate is possible. No pulmonary edema.   Electronically Signed   By: Lajean Manes M.D.   On: 06/07/2014 18:18     I have independently reviewed the above radiologic studies.  Recent Lab Findings: Lab Results  Component Value Date   WBC 18.0* 06/10/2014   HGB 12.7* 06/10/2014   HCT 37.9* 06/10/2014   PLT 271 06/10/2014   GLUCOSE 121* 06/10/2014   ALT 14 06/07/2014   AST 13 06/07/2014   NA 141 06/10/2014   K 3.9 06/10/2014   CL 108 06/10/2014   CREATININE 1.21 06/10/2014   BUN 21 06/10/2014   CO2 24 06/10/2014      Assessment / Plan:   Patient with history of community-acquired pneumonia 3 weeks ago now presents with increasing shortness of breath low-grade fever and chills and large complex pleural  effusion on the right consistent with empyema. Patient will be admitted now for IV fluid resuscitation, cultures, starting antibiotics and proceeding with right video-assisted thoracoscopy and drainage of empyema on the right early tomorrow morning. Wrist and options are discussed with the patient in detail and he is agreeable with our plan. Blood cultures, lactate level, sputum culture will be obtained as soon as possible.    The goals risks and alternatives of the planned surgical procedure bronchoscopy right VATS and drainage or empyema  have been discussed with the patient in detail. The risks of the procedure including death, infection, stroke, myocardial infarction, bleeding, blood transfusion have all been discussed specifically.  I have quoted Rosezetta Schlatter a 3 % of perioperative mortality and a complication rate as high as 25 %. The patient's questions have been answered.[name] is willing  to proceed with the planned  procedure. [name] Isaac MD      [name] [place] [address] Office [phone]   Beeper [phone]  06/26/2014 5:16 PM

## 2014-06-26 NOTE — Progress Notes (Signed)
[name] [name] [address]       [place] [address]             [phone]                    [name] [name] [facility] Medical Record #[id number] Date of Birth: 08/21/1945  Referring: [name] Du, MD Primary Care: [name] Bogus, MD  Chief Complaint:    Chief Complaint  Patient presents with  . Pleural Effusion    Surgical eval, Chest [name] 06/26/14  . Empyema    History of Present Illness:    [name] [name] 69 y.o. male is seen in the office  today for at the request of Dr. Luan [name]. The patient gives a history of low-grade fever starting March 19 by March 20 he had developed increasing right chest pain. And shortness of breath. He was admitted to [name] [name] scan of the abdomen was done, no [name] of the chest. He was treated for pneumonia and discharged home on March 22 on Levaquin. He stopped the Levaquin approximately 10 days ago. Since he has become progressively  short of breath right chest discomfort low-grade fever and chills. He arranged to see Dr. Tawnya [name] 2 performed a [name] scan of the chest and referred him immediately to thoracic surgery.  The patient gives no previous history of cardiac disease or history of pulmonary emboli  Current Activity/ Functional Status:  Patient is independent with mobility/ambulation, transfers, ADL's, IADL's.   Zubrod Score: At the time of surgery this patient's most appropriate activity status/level should be described as: [x]     0    Normal activity, no symptoms []     1    Restricted in physical strenuous activity but ambulatory, able to do out light work []     2    Ambulatory and capable of self care, unable to do work activities, up and about               >50 % of waking hours                              []     3    Only limited self care, in bed greater than 50% of waking hours []     4    Completely disabled, no self care, confined to bed or chair []     5    Moribund   Past Medical History  Diagnosis Date  .  Hypertension   . Urinary frequency   . Low serum testosterone level     Past Surgical History  Procedure Laterality Date  . Hernia repair    . Appendectomy      Family History  Problem Relation Age of Onset  . Tuberculosis Father   . Hypertension Father   . COPD Mother   . Hypertension Mother     History   Social History  . Marital Status: Married    Spouse Name: N/A  . Number of Children: N/A  . Years of Education: N/A   Occupational History  . Not on file.   Social History Main Topics  . Smoking status: Current Some Day Smoker    Types: Pipe  . Smokeless tobacco: Not on file  . Alcohol Use: Yes     Comment: occasionally  . Drug Use: No  . Sexual Activity: Not on file   Other Topics Concern  . Not  on file   Social History Narrative    History  Smoking status  . Current Some Day Smoker  . Types: Pipe  Smokeless tobacco  . Not on file    History  Alcohol Use  . Yes    Comment: occasionally     No Known Allergies  Current Outpatient Prescriptions  Medication Sig Dispense Refill  . acetaminophen (TYLENOL) 650 MG CR tablet Take 650 mg by mouth every 8 (eight) hours as needed for fever.    Marland Kitchen amLODipine (NORVASC) 2.5 MG tablet Take 1 tablet by mouth daily.    Marland Kitchen aspirin EC 81 MG tablet Take 81 mg by mouth daily.    . budesonide-formoterol (SYMBICORT) 160-4.5 MCG/ACT inhaler Inhale 2 puffs into the lungs 2 (two) times daily.    . Calcium Carbonate-Vitamin D (CALCIUM + D PO) Take 1 tablet by mouth daily.    . Lactobacillus (ACIDOPHILUS PROBIOTIC) 10 MG TABS Take 1 tablet by mouth daily.    Marland Kitchen lisinopril (PRINIVIL,ZESTRIL) 20 MG tablet Take 1 tablet by mouth daily.    . Multiple Vitamin (MULTIVITAMIN WITH MINERALS) TABS tablet Take 1 tablet by mouth daily.    . Multiple Vitamins-Minerals (PRESERVISION AREDS 2 PO) Take 2 tablets by mouth daily.    [name] [name] Fatty Acids-Phytosterols (PA FISH OIL/PHYTOSTEROLS) 450-400 MG CAPS Take 1 capsule by mouth daily.      . Omega-3 Fatty Acids (FISH OIL) 1200 MG CAPS Take 1 capsule by mouth daily.    . tamsulosin (FLOMAX) 0.4 MG CAPS capsule Take 1 capsule by mouth daily.    Marland Kitchen testosterone (ANDROGEL) 50 MG/5GM (1%) GEL Place 5 g onto the skin daily.     No current facility-administered medications for this visit.      Review of Systems:     Cardiac Review of Systems: Y or N  Chest Pain [  y  ]  Resting SOB [  y ] Exertional SOB  Blue.Reese  ]  Orthopnea [ y ]   Pedal Edema [n   ]    Palpitations Florencio.Farrier  ] Syncope  [ n ]   Presyncope [  n ]  General Review of Systems: [Y] = yes [  ]=no Constitional: recent weight change [  ];  Wt loss over the last 3 months [   ] anorexia [  ]; fatigue [  ]; nausea [  ]; night sweats [ y ]; fever Blue.Reese  ]; or chills Blue.Reese  ];          Dental: poor dentition[ n ]; Last Dentist visit:   Eye : blurred vision [  ]; diplopia [   ]; vision changes [  ];  Amaurosis fugax[  ]; Resp: cough [  ];  wheezing[ y ];  hemoptysis[n  ]; shortness of breath[ y ]; paroxysmal nocturnal dyspnea[ y ]; dyspnea on exertion[ y ]; or orthopnea[y  ];  GI:  gallstones[  ], vomiting[  ];  dysphagia[  ]; melena[  ];  hematochezia [  ]; heartburn[  ];   Hx of  Colonoscopy[  ]; GU: kidney stones [  ]; hematuria[  ];   dysuria [  ];  nocturia[  ];  history of     obstruction [  ]; urinary frequency [  ]             Skin: rash, swelling[  ];, hair loss[  ];  peripheral edema[  ];  or itching[  ]; Musculosketetal: myalgias[  ];  joint swelling[  ];  joint erythema[  ];  joint pain[  ];  back pain[  ];  Heme/Lymph: bruising[  ];  bleeding[  ];  anemia[  ];  Neuro: TIA[n  ];  headaches[  ];  stroke[  ];  vertigo[  ];  seizures[  ];   paresthesias[  ];  difficulty walking[  ];  Psych:depression[  ]; anxiety[  ];  Endocrine: diabetes[  ];  thyroid dysfunction[  ];  Immunizations: Flu up to date [ y ]; Pneumococcal up to date Blue.Reese  ];  Other:  Physical Exam: BP 150/84 mmHg  Pulse 115  Resp 20  Ht 5\' 11"  (1.803 m)  Wt 193 lb  (87.544 kg)  BMI 26.93 kg/m2  SpO2 90%  PHYSICAL EXAMINATION: General appearance: alert, cooperative and moderate distress Head: Normocephalic, without obvious abnormality, atraumatic Neck: no adenopathy, no carotid bruit, no JVD, supple, symmetrical, trachea midline and thyroid not enlarged, symmetric, no tenderness/mass/nodules Lymph nodes: Cervical, supraclavicular, and axillary nodes normal. Resp: diminished breath sounds RLL and RML Back: symmetric, no curvature. ROM normal. No CVA tenderness. Cardio: regular rate and rhythm, S1, S2 normal, no murmur, click, rub or gallop GI: soft, non-tender; bowel sounds normal; no masses,  no organomegaly Extremities: extremities normal, atraumatic, no cyanosis or edema and Homans sign is negative, no sign of DVT Neurologic: Grossly normal Patient has 1+ DP and PT pulses bilaterally  Diagnostic Studies & Laboratory data:     Recent Radiology Findings:   Dg Chest 2 View  06/12/2014   CLINICAL DATA:  History pneumonia, followup, fever  EXAM: CHEST  2 VIEW  COMPARISON:  Portable chest x-ray 06/07/2014 and [name] abdomen and pelvis of the same day  FINDINGS: The right lower lobe parenchymal opacity and small right pleural effusion noted on [name] of the abdomen pelvis appears to remain with persistent pleural and parenchymal opacity at the right lung base. The left lung is clear. Mediastinal and hilar contours are unremarkable. The bulging of the left mediastinal contour is unchanged and probably is due to a prominent pulmonary artery segment. The heart is unchanged in size. There are degenerative changes in the lower thoracic spine.  IMPRESSION: Persistent pleural and parenchymal opacity at the right lung base most consistent with atelectasis, pneumonia, and right pleural effusion.   Electronically Signed   By: Ivar Drape M.D.   On: 06/12/2014 15:27   [name] Chest W Contrast  06/26/2014   CLINICAL DATA:  Recent pneumonia of unspecified organism, continues to be  tired and easily out of breath, weight loss, history hypertension  EXAM: [name] CHEST WITH CONTRAST  TECHNIQUE: Multidetector [name] imaging of the chest was performed during intravenous contrast administration. Sagittal and coronal MPR images reconstructed from axial data set.  CONTRAST:  64mL OMNIPAQUE IOHEXOL 300 MG/ML  SOLN IV  COMPARISON:  None ; correlation chest radiograph 06/12/2014 and [name] abdomen/pelvis 06/07/2014  FINDINGS: Thoracic vascular structures grossly patent on nondedicated exam.  Numerous normal sized thoracic lymph nodes.  Single minimally enlarged precarinal lymph node 12 mm short axis image 34.  Scattered atherosclerotic calcification of aorta and coronary arteries.  Mild thickening of adrenal glands bilaterally without discrete mass.  Small hepatic and LEFT renal cysts unchanged.  Remaining visualized portion of upper abdomen normal.  Large RIGHT pleural effusion, partially loculated.  Loculation at the posterior medial RIGHT hemi thorax appears walled off with slightly higher attenuation and mild pleural thickening, unable to exclude empyema, collection measuring 7.6 x 4.1 x 8.4 cm.  Significant atelectasis of the RIGHT lung greatest in lower lobe.  Questionable tiny nodules LEFT lower lobe images 54-56.  LEFT lung otherwise clear.  No pneumothorax.  Osseous structures unremarkable.  IMPRESSION: Large RIGHT pleural effusion which is partially loculated.  Additional loculated collection at the posterior inferior RIGHT hemithorax medially measuring 7.6 x 4.1 x 8.4 cm in size, demonstrating slightly increased attenuation versus remaining pleural fluid and thicker margins, cannot exclude loculated empyema.  Significant atelectasis of RIGHT lung.  Tiny questionable LEFT lower lobe nodules ; recommendation below.  If the patient is at high risk for bronchogenic carcinoma, follow-up chest [name] at 1 year is recommended. If the patient is at low risk, no follow-up is needed. This recommendation follows the  consensus statement: Guidelines for Management of Small Pulmonary Nodules Detected on [name] Scans: A Statement from the [employer] as published in Radiology 2005; 237:395-400.  Findings called to Dr. Luan [name] on 06/26/2014 at 1112 hours.   Electronically Signed   By: Lavonia Dana M.D.   On: 06/26/2014 11:16   [name] Abdomen Pelvis W Contrast  06/07/2014   CLINICAL DATA:  Right flank pain, generalized body aches, fever, shortness of breath  EXAM: [name] ABDOMEN AND PELVIS WITH CONTRAST  TECHNIQUE: Multidetector [name] imaging of the abdomen and pelvis was performed using the standard protocol following bolus administration of intravenous contrast.  CONTRAST:  136mL OMNIPAQUE IOHEXOL 300 MG/ML  SOLN  COMPARISON:  None.  FINDINGS: Lower chest: Patchy right middle and lower lobe opacities, suspicious for pneumonia. Associated small right pleural effusion.  Hepatobiliary: 11 mm probable cyst in the medial segment left hepatic lobe (series 2/ image 23).  Gallbladder is unremarkable. No intrahepatic or extrahepatic ductal dilatation.  Pancreas: Within normal limits.  Spleen: Within normal limits.  Adrenals/Urinary Tract: Adrenal glands are within normal limits.  16 mm cyst in the lateral interpolar left kidney (series 2/ image 29). Right kidney is within normal limits. No hydronephrosis.  Bladder is within normal limits.  Stomach/Bowel: Stomach is within normal limits.  No evidence of bowel obstruction.  Prior appendectomy.  Mild colonic diverticulosis, without evidence of diverticulitis.  Vascular/Lymphatic: Atherosclerotic calcifications of the abdominal aorta and branch vessels.  No suspicious abdominopelvic lymphadenopathy.  Reproductive: Prostate is notable for dystrophic calcifications.  Other: No abdominopelvic ascites.  Postsurgical changes related to prior right inguinal hernia repair.  Musculoskeletal: Degenerative changes of the visualized thoracolumbar spine.  IMPRESSION: Patchy right middle and lower lobe opacities,  suspicious for pneumonia. Associated small right pleural effusion.  No evidence of bowel obstruction.  Prior appendectomy.   Electronically Signed   By: Julian Hy M.D.   On: 06/07/2014 20:23   Dg Chest Port 1 View  06/07/2014   CLINICAL DATA:  PATIENT ARRIEVED TO ER BY EMS, RIGHT SIDE/FLANK PAIN STARTED TODAY AND SOB TODAY. BODY ACHES X 1 WEEK. LEFT SHOULDER PAIN, NAUSEA YESTERDAY. PATIENT STATES " HURTS TO BREATHE" STATES " HE SMOKES A PIPE" HISTORY OF HTN  EXAM: PORTABLE CHEST - 1 VIEW  COMPARISON:  None  FINDINGS: There is basilar opacity, most evident on right, which may all be atelectasis. Pneumonia is possible. There is no evidence of pulmonary edema. No pleural effusion or pneumothorax.  Cardiac silhouette is normal in size. No mediastinal or hilar masses. Bony thorax is demineralized but grossly intact.  IMPRESSION: Right greater than left lung base opacity. Atelectasis/scarring suspected. Infiltrate is possible. No pulmonary edema.   Electronically Signed   By: Lajean Manes M.D.   On: 06/07/2014  18:18     I have independently reviewed the above radiologic studies.  Recent Lab Findings: Lab Results  Component Value Date   WBC 18.0* 06/10/2014   HGB 12.7* 06/10/2014   HCT 37.9* 06/10/2014   PLT 271 06/10/2014   GLUCOSE 121* 06/10/2014   ALT 14 06/07/2014   AST 13 06/07/2014   NA 141 06/10/2014   K 3.9 06/10/2014   CL 108 06/10/2014   CREATININE 1.21 06/10/2014   BUN 21 06/10/2014   CO2 24 06/10/2014      Assessment / Plan:   Patient with history of community-acquired pneumonia 3 weeks ago now presents with increasing shortness of breath low-grade fever and chills and large complex pleural effusion on the right consistent with empyema. Patient will be admitted now for IV fluid resuscitation, cultures, starting antibiotics and proceeding with right video-assisted thoracoscopy and drainage of empyema on the right early tomorrow morning. Wrist and options are discussed with  the patient in detail and he is agreeable with our plan. Blood cultures, lactate level, sputum culture will be obtained as soon as possible.      I  spent 40 minutes counseling the patient face to face and 50% or more the  time was spent in counseling and coordination of care. The total time spent in the appointment was 60 minutes.  Grace Isaac MD      [address] [place] [address] Office [phone]   Beeper [phone]  06/26/2014 3:54 PM

## 2014-06-27 ENCOUNTER — Encounter (HOSPITAL_COMMUNITY)
Admission: AD | Disposition: A | Discharge status: Home / Self Care | Payer: Self-pay | Source: Ambulatory Visit | Attending: Cardiothoracic Surgery

## 2014-06-27 ENCOUNTER — Inpatient Hospital Stay (HOSPITAL_COMMUNITY): Payer: Medicare Other

## 2014-06-27 ENCOUNTER — Inpatient Hospital Stay (HOSPITAL_COMMUNITY): Payer: Medicare Other | Admitting: Anesthesiology

## 2014-06-27 ENCOUNTER — Encounter (HOSPITAL_COMMUNITY): Payer: Self-pay | Admitting: Anesthesiology

## 2014-06-27 DIAGNOSIS — J869 Pyothorax without fistula: Secondary | ICD-10-CM

## 2014-06-27 HISTORY — PX: EMPYEMA DRAINAGE: SHX5097

## 2014-06-27 HISTORY — PX: VIDEO BRONCHOSCOPY: SHX5072

## 2014-06-27 HISTORY — PX: VIDEO ASSISTED THORACOSCOPY: SHX5073

## 2014-06-27 LAB — GLUCOSE, SEROUS FLUID: Glucose, Fluid: <20 mg/dL

## 2014-06-27 LAB — GRAM STAIN

## 2014-06-27 LAB — BODY FLUID CELL COUNT WITH DIFFERENTIAL
Eos, Fluid: NONE SEEN %
Lymphs, Fluid: 2 %
Monocyte-Macrophage-Serous Fluid: 4 % — ABNORMAL LOW (ref 50–90)
Neutrophil Count, Fluid: 94 % — ABNORMAL HIGH (ref 0–25)
Total Nucleated Cell Count, Fluid: 23625 cu mm — ABNORMAL HIGH (ref 0–1000)

## 2014-06-27 LAB — LACTATE DEHYDROGENASE: LDH: 1565 U/L — ABNORMAL HIGH (ref 94–250)

## 2014-06-27 LAB — PROTEIN, BODY FLUID: Total protein, fluid: 4.2 g/dL

## 2014-06-27 SURGERY — BRONCHOSCOPY, VIDEO-ASSISTED
Anesthesia: General | Site: Chest | Laterality: Right

## 2014-06-27 MED ORDER — AMLODIPINE BESYLATE 2.5 MG PO TABS
2.5000 mg | ORAL_TABLET | Freq: Every day | ORAL | Status: DC
  Administered 2014-06-28 – 2014-07-04 (×7): 2.5 mg via ORAL
  Filled 2014-06-27 (×7): qty 1

## 2014-06-27 MED ORDER — PHENYLEPHRINE 40 MCG/ML (10ML) SYRINGE FOR IV PUSH (FOR BLOOD PRESSURE SUPPORT)
PREFILLED_SYRINGE | INTRAVENOUS | Status: AC
  Filled 2014-06-27: qty 10

## 2014-06-27 MED ORDER — ARTIFICIAL TEARS OP OINT
TOPICAL_OINTMENT | OPHTHALMIC | Status: AC
Start: 2014-06-27 — End: 2014-06-27
  Filled 2014-06-27: qty 3.5

## 2014-06-27 MED ORDER — NEOSTIGMINE METHYLSULFATE 10 MG/10ML IV SOLN
INTRAVENOUS | Status: DC | PRN
  Administered 2014-06-27: 4 mg via INTRAVENOUS

## 2014-06-27 MED ORDER — VECURONIUM BROMIDE 10 MG IV SOLR
INTRAVENOUS | Status: DC | PRN
  Administered 2014-06-27 (×2): 1 mg via INTRAVENOUS
  Administered 2014-06-27 (×2): .5 mg via INTRAVENOUS

## 2014-06-27 MED ORDER — HYDROMORPHONE HCL 1 MG/ML IJ SOLN
INTRAMUSCULAR | Status: AC
  Filled 2014-06-27: qty 1

## 2014-06-27 MED ORDER — PROPOFOL 10 MG/ML IV BOLUS
INTRAVENOUS | Status: AC
  Filled 2014-06-27: qty 20

## 2014-06-27 MED ORDER — NEOSTIGMINE METHYLSULFATE 10 MG/10ML IV SOLN
INTRAVENOUS | Status: AC
  Filled 2014-06-27: qty 1

## 2014-06-27 MED ORDER — TRAMADOL HCL 50 MG PO TABS
50.0000 mg | ORAL_TABLET | Freq: Four times a day (QID) | ORAL | Status: DC | PRN
  Administered 2014-07-01 – 2014-07-02 (×2): 50 mg via ORAL
  Filled 2014-06-27 (×2): qty 1

## 2014-06-27 MED ORDER — ROCURONIUM BROMIDE 100 MG/10ML IV SOLN
INTRAVENOUS | Status: DC | PRN
  Administered 2014-06-27: 40 mg via INTRAVENOUS
  Administered 2014-06-27: 10 mg via INTRAVENOUS

## 2014-06-27 MED ORDER — MEPERIDINE HCL 25 MG/ML IJ SOLN: 6.2500 mg | INTRAMUSCULAR | Status: DC | PRN

## 2014-06-27 MED ORDER — NALOXONE HCL 0.4 MG/ML IJ SOLN: 0.4000 mg | INTRAMUSCULAR | Status: DC | PRN

## 2014-06-27 MED ORDER — DIPHENHYDRAMINE HCL 50 MG/ML IJ SOLN: 12.5000 mg | Freq: Four times a day (QID) | INTRAMUSCULAR | Status: DC | PRN

## 2014-06-27 MED ORDER — TAMSULOSIN HCL 0.4 MG PO CAPS
0.4000 mg | ORAL_CAPSULE | Freq: Every day | ORAL | Status: DC
  Administered 2014-06-27 – 2014-07-04 (×8): 0.4 mg via ORAL
  Filled 2014-06-27 (×8): qty 1

## 2014-06-27 MED ORDER — ARTIFICIAL TEARS OP OINT
TOPICAL_OINTMENT | OPHTHALMIC | Status: AC
  Filled 2014-06-27: qty 3.5

## 2014-06-27 MED ORDER — RISAQUAD PO CAPS
1.0000 | ORAL_CAPSULE | Freq: Every day | ORAL | Status: DC
  Administered 2014-06-28 – 2014-07-04 (×7): 1 via ORAL
  Filled 2014-06-27 (×7): qty 1

## 2014-06-27 MED ORDER — ONDANSETRON HCL 4 MG/2ML IJ SOLN: 4.0000 mg | Freq: Four times a day (QID) | INTRAMUSCULAR | Status: DC | PRN

## 2014-06-27 MED ORDER — BISACODYL 5 MG PO TBEC
10.0000 mg | DELAYED_RELEASE_TABLET | Freq: Every day | ORAL | Status: DC
  Administered 2014-06-28 – 2014-07-02 (×4): 10 mg via ORAL
  Filled 2014-06-27 (×4): qty 2

## 2014-06-27 MED ORDER — LACTATED RINGERS IV SOLN
INTRAVENOUS | Status: DC | PRN
  Administered 2014-06-27: 07:00:00 via INTRAVENOUS

## 2014-06-27 MED ORDER — ACETAMINOPHEN 160 MG/5ML PO SOLN
1000.0000 mg | Freq: Four times a day (QID) | ORAL | Status: AC
  Administered 2014-07-01: 1000 mg via ORAL
  Filled 2014-06-27: qty 40

## 2014-06-27 MED ORDER — ALBUMIN HUMAN 5 % IV SOLN
INTRAVENOUS | Status: DC | PRN
  Administered 2014-06-27: 10:00:00 via INTRAVENOUS

## 2014-06-27 MED ORDER — ROCURONIUM BROMIDE 50 MG/5ML IV SOLN
INTRAVENOUS | Status: AC
  Filled 2014-06-27: qty 1

## 2014-06-27 MED ORDER — DEXTROSE 5 % IV SOLN
10.0000 mg | INTRAVENOUS | Status: DC | PRN
  Administered 2014-06-27: 75 ug/min via INTRAVENOUS

## 2014-06-27 MED ORDER — SODIUM CHLORIDE 0.9 % IJ SOLN
INTRAMUSCULAR | Status: AC
  Filled 2014-06-27: qty 10

## 2014-06-27 MED ORDER — HYDROMORPHONE HCL 1 MG/ML IJ SOLN
0.2500 mg | INTRAMUSCULAR | Status: DC | PRN
  Administered 2014-06-27: 0.5 mg via INTRAVENOUS
  Administered 2014-06-27: 11:00:00 via INTRAVENOUS
  Administered 2014-06-27: 0.5 mg via INTRAVENOUS

## 2014-06-27 MED ORDER — MIDAZOLAM HCL 5 MG/5ML IJ SOLN
INTRAMUSCULAR | Status: DC | PRN
  Administered 2014-06-27: 1 mg via INTRAVENOUS
  Administered 2014-06-27 (×2): 0.5 mg via INTRAVENOUS

## 2014-06-27 MED ORDER — VECURONIUM BROMIDE 10 MG IV SOLR
INTRAVENOUS | Status: AC
  Filled 2014-06-27: qty 10

## 2014-06-27 MED ORDER — ACIDOPHILUS PROBIOTIC 10 MG PO TABS: 1.0000 | ORAL_TABLET | Freq: Every day | ORAL | Status: DC

## 2014-06-27 MED ORDER — POTASSIUM CHLORIDE 10 MEQ/50ML IV SOLN
10.0000 meq | Freq: Every day | INTRAVENOUS | Status: DC | PRN
  Filled 2014-06-27: qty 50

## 2014-06-27 MED ORDER — SUCCINYLCHOLINE CHLORIDE 20 MG/ML IJ SOLN
INTRAMUSCULAR | Status: AC
  Filled 2014-06-27: qty 1

## 2014-06-27 MED ORDER — LIDOCAINE HCL (CARDIAC) 20 MG/ML IV SOLN
INTRAVENOUS | Status: AC
  Filled 2014-06-27: qty 5

## 2014-06-27 MED ORDER — FENTANYL 10 MCG/ML IV SOLN
INTRAVENOUS | Status: AC
  Administered 2014-06-27: 12:00:00 via INTRAVENOUS
  Filled 2014-06-27: qty 50

## 2014-06-27 MED ORDER — ASPIRIN EC 81 MG PO TBEC
81.0000 mg | DELAYED_RELEASE_TABLET | Freq: Every day | ORAL | Status: DC
  Administered 2014-06-28 – 2014-07-04 (×7): 81 mg via ORAL
  Filled 2014-06-27 (×7): qty 1

## 2014-06-27 MED ORDER — PROMETHAZINE HCL 25 MG/ML IJ SOLN: 6.2500 mg | INTRAMUSCULAR | Status: DC | PRN

## 2014-06-27 MED ORDER — DIPHENHYDRAMINE HCL 12.5 MG/5ML PO ELIX
12.5000 mg | ORAL_SOLUTION | Freq: Four times a day (QID) | ORAL | Status: DC | PRN
  Filled 2014-06-27: qty 5

## 2014-06-27 MED ORDER — KCL IN DEXTROSE-NACL 20-5-0.45 MEQ/L-%-% IV SOLN
INTRAVENOUS | Status: DC
  Administered 2014-06-27: 100 mL/h via INTRAVENOUS
  Administered 2014-06-28 – 2014-06-29 (×2): via INTRAVENOUS
  Filled 2014-06-27 (×6): qty 1000

## 2014-06-27 MED ORDER — LIDOCAINE HCL (CARDIAC) 20 MG/ML IV SOLN
INTRAVENOUS | Status: DC | PRN
  Administered 2014-06-27: 10 mg via INTRAVENOUS

## 2014-06-27 MED ORDER — 0.9 % SODIUM CHLORIDE (POUR BTL) OPTIME
TOPICAL | Status: DC | PRN
  Administered 2014-06-27: 2000 mL

## 2014-06-27 MED ORDER — ONDANSETRON HCL 4 MG/2ML IJ SOLN
INTRAMUSCULAR | Status: AC
  Filled 2014-06-27: qty 2

## 2014-06-27 MED ORDER — PHENYLEPHRINE HCL 10 MG/ML IJ SOLN
INTRAMUSCULAR | Status: DC | PRN
  Administered 2014-06-27 (×2): 120 ug via INTRAVENOUS
  Administered 2014-06-27 (×2): 80 ug via INTRAVENOUS

## 2014-06-27 MED ORDER — SENNOSIDES-DOCUSATE SODIUM 8.6-50 MG PO TABS
1.0000 | ORAL_TABLET | Freq: Every day | ORAL | Status: DC
  Administered 2014-06-27 – 2014-07-03 (×7): 1 via ORAL
  Filled 2014-06-27 (×8): qty 1

## 2014-06-27 MED ORDER — STERILE WATER FOR INJECTION IJ SOLN
INTRAMUSCULAR | Status: AC
  Filled 2014-06-27: qty 10

## 2014-06-27 MED ORDER — FENTANYL CITRATE 0.05 MG/ML IJ SOLN
INTRAMUSCULAR | Status: DC | PRN
  Administered 2014-06-27: 25 ug via INTRAVENOUS
  Administered 2014-06-27: 200 ug via INTRAVENOUS
  Administered 2014-06-27: 25 ug via INTRAVENOUS

## 2014-06-27 MED ORDER — MIDAZOLAM HCL 2 MG/2ML IJ SOLN: 0.5000 mg | Freq: Once | INTRAMUSCULAR | Status: DC | PRN

## 2014-06-27 MED ORDER — SUCCINYLCHOLINE CHLORIDE 20 MG/ML IJ SOLN
INTRAMUSCULAR | Status: DC | PRN
  Administered 2014-06-27: 120 mg via INTRAVENOUS

## 2014-06-27 MED ORDER — ADULT MULTIVITAMIN W/MINERALS CH
1.0000 | ORAL_TABLET | Freq: Every day | ORAL | Status: DC
  Administered 2014-06-28 – 2014-07-04 (×7): 1 via ORAL
  Filled 2014-06-27 (×7): qty 1

## 2014-06-27 MED ORDER — FENTANYL CITRATE 0.05 MG/ML IJ SOLN
INTRAMUSCULAR | Status: AC
  Filled 2014-06-27: qty 5

## 2014-06-27 MED ORDER — GLYCOPYRROLATE 0.2 MG/ML IJ SOLN
INTRAMUSCULAR | Status: DC | PRN
  Administered 2014-06-27: 0.6 mg via INTRAVENOUS

## 2014-06-27 MED ORDER — MIDAZOLAM HCL 2 MG/2ML IJ SOLN
INTRAMUSCULAR | Status: AC
  Filled 2014-06-27: qty 2

## 2014-06-27 MED ORDER — OXYCODONE HCL 5 MG PO TABS
5.0000 mg | ORAL_TABLET | ORAL | Status: DC | PRN
  Administered 2014-06-28 – 2014-07-01 (×9): 10 mg via ORAL
  Administered 2014-07-02: 5 mg via ORAL
  Filled 2014-06-27 (×3): qty 2
  Filled 2014-06-27: qty 1
  Filled 2014-06-27 (×6): qty 2

## 2014-06-27 MED ORDER — SODIUM CHLORIDE 0.9 % IJ SOLN: 9.0000 mL | INTRAMUSCULAR | Status: DC | PRN

## 2014-06-27 MED ORDER — GLYCOPYRROLATE 0.2 MG/ML IJ SOLN
INTRAMUSCULAR | Status: AC
  Filled 2014-06-27: qty 3

## 2014-06-27 MED ORDER — LACTATED RINGERS IV SOLN
INTRAVENOUS | Status: DC | PRN
  Administered 2014-06-27 (×2): via INTRAVENOUS

## 2014-06-27 MED ORDER — FENTANYL 10 MCG/ML IV SOLN
INTRAVENOUS | Status: DC
  Administered 2014-06-27: 30 ug via INTRAVENOUS
  Administered 2014-06-27: 45 ug via INTRAVENOUS
  Administered 2014-06-27: 150 ug via INTRAVENOUS
  Administered 2014-06-28: 405 ug via INTRAVENOUS
  Administered 2014-06-28: 30 ug via INTRAVENOUS
  Administered 2014-06-28: 90 ug via INTRAVENOUS
  Administered 2014-06-28: 60 ug via INTRAVENOUS
  Administered 2014-06-28: 06:00:00 via INTRAVENOUS
  Administered 2014-06-28: 75 ug via INTRAVENOUS
  Administered 2014-06-29: 0 ug via INTRAVENOUS
  Administered 2014-06-29: 0 ug/h via INTRAVENOUS
  Administered 2014-06-29: 0 ug via INTRAVENOUS
  Administered 2014-06-29: 40 ug via INTRAVENOUS
  Filled 2014-06-27 (×2): qty 50

## 2014-06-27 MED ORDER — PHENYLEPHRINE HCL 10 MG/ML IJ SOLN
INTRAMUSCULAR | Status: AC
  Filled 2014-06-27: qty 1

## 2014-06-27 MED ORDER — EPHEDRINE SULFATE 50 MG/ML IJ SOLN
INTRAMUSCULAR | Status: AC
  Filled 2014-06-27: qty 1

## 2014-06-27 MED ORDER — ACETAMINOPHEN 500 MG PO TABS
1000.0000 mg | ORAL_TABLET | Freq: Four times a day (QID) | ORAL | Status: AC
  Administered 2014-06-27 – 2014-07-02 (×16): 1000 mg via ORAL
  Filled 2014-06-27 (×18): qty 2

## 2014-06-27 MED ORDER — PROPOFOL 10 MG/ML IV BOLUS
INTRAVENOUS | Status: DC | PRN
  Administered 2014-06-27: 90 mg via INTRAVENOUS

## 2014-06-27 MED ORDER — ONDANSETRON HCL 4 MG/2ML IJ SOLN
INTRAMUSCULAR | Status: DC | PRN
  Administered 2014-06-27: 4 mg via INTRAVENOUS

## 2014-06-27 MED ORDER — BUDESONIDE-FORMOTEROL FUMARATE 160-4.5 MCG/ACT IN AERO
2.0000 | INHALATION_SPRAY | Freq: Two times a day (BID) | RESPIRATORY_TRACT | Status: DC
  Administered 2014-06-27 – 2014-06-28 (×3): 2 via RESPIRATORY_TRACT
  Filled 2014-06-27: qty 6

## 2014-06-27 SURGICAL SUPPLY — 80 items
APPLICATOR TIP COSEAL (VASCULAR PRODUCTS) IMPLANT
APPLICATOR TIP EXT COSEAL (VASCULAR PRODUCTS) IMPLANT
BLADE SURG 11 STRL SS (BLADE) ×3 IMPLANT
BRUSH CYTOL CELLEBRITY 1.5X140 (MISCELLANEOUS) IMPLANT
CANISTER SUCTION 2500CC (MISCELLANEOUS) ×3 IMPLANT
CATH KIT ON Q 5IN SLV (PAIN MANAGEMENT) IMPLANT
CATH THORACIC 28FR (CATHETERS) IMPLANT
CATH THORACIC 36FR (CATHETERS) IMPLANT
CATH THORACIC 36FR RT ANG (CATHETERS) IMPLANT
CLEANER TIP ELECTROSURG 2X2 (MISCELLANEOUS) IMPLANT
CLIP TI MEDIUM 6 (CLIP) IMPLANT
CONN ST 1/4X3/8  BEN (MISCELLANEOUS) ×3
CONN ST 1/4X3/8 BEN (MISCELLANEOUS) ×6 IMPLANT
CONT SPEC 4OZ CLIKSEAL STRL BL (MISCELLANEOUS) ×9 IMPLANT
CONT SPECI 4OZ STER CLIK (MISCELLANEOUS) ×9 IMPLANT
COVER TABLE BACK 60X90 (DRAPES) ×3 IMPLANT
DERMABOND ADVANCED (GAUZE/BANDAGES/DRESSINGS)
DERMABOND ADVANCED .7 DNX12 (GAUZE/BANDAGES/DRESSINGS) IMPLANT
DRAIN CHANNEL 28F RND 3/8 FF (WOUND CARE) ×6 IMPLANT
DRAPE LAPAROSCOPIC ABDOMINAL (DRAPES) ×3 IMPLANT
DRAPE SLUSH/WARMER DISC (DRAPES) ×3 IMPLANT
DRAPE WARM FLUID 44X44 (DRAPE) IMPLANT
DRILL BIT 7/64X5 (BIT) IMPLANT
ELECT BLADE 4.0 EZ CLEAN MEGAD (MISCELLANEOUS) ×3
ELECT REM PT RETURN 9FT ADLT (ELECTROSURGICAL) ×3
ELECTRODE BLDE 4.0 EZ CLN MEGD (MISCELLANEOUS) ×2 IMPLANT
ELECTRODE REM PT RTRN 9FT ADLT (ELECTROSURGICAL) ×2 IMPLANT
FORCEPS BIOP RJ4 1.8 (CUTTING FORCEPS) IMPLANT
GAUZE SPONGE 4X4 12PLY STRL (GAUZE/BANDAGES/DRESSINGS) ×6 IMPLANT
GLOVE BIO SURGEON STRL SZ 6.5 (GLOVE) ×6 IMPLANT
GLOVE BIOGEL PI IND STRL 6 (GLOVE) ×2 IMPLANT
GLOVE BIOGEL PI INDICATOR 6 (GLOVE) ×1
GOWN STRL REUS W/ TWL LRG LVL3 (GOWN DISPOSABLE) ×8 IMPLANT
GOWN STRL REUS W/TWL LRG LVL3 (GOWN DISPOSABLE) ×4
KIT BASIN OR (CUSTOM PROCEDURE TRAY) ×3 IMPLANT
KIT CLEAN ENDO COMPLIANCE (KITS) ×3 IMPLANT
KIT ROOM TURNOVER OR (KITS) ×3 IMPLANT
KIT SUCTION CATH 14FR (SUCTIONS) ×6 IMPLANT
MARKER SKIN DUAL TIP RULER LAB (MISCELLANEOUS) ×6 IMPLANT
NEEDLE 18GX1X1/2 (RX/OR ONLY) (NEEDLE) ×3 IMPLANT
NEEDLE BIOPSY TRANSBRONCH 21G (NEEDLE) IMPLANT
NS IRRIG 1000ML POUR BTL (IV SOLUTION) ×6 IMPLANT
OIL SILICONE PENTAX (PARTS (SERVICE/REPAIRS)) ×3 IMPLANT
PACK CHEST (CUSTOM PROCEDURE TRAY) ×3 IMPLANT
PAD ARMBOARD 7.5X6 YLW CONV (MISCELLANEOUS) ×6 IMPLANT
PASSER SUT SWANSON 36MM LOOP (INSTRUMENTS) IMPLANT
SEALANT PROGEL (MISCELLANEOUS) IMPLANT
SEALANT SURG COSEAL 4ML (VASCULAR PRODUCTS) IMPLANT
SEALANT SURG COSEAL 8ML (VASCULAR PRODUCTS) IMPLANT
SOLUTION ANTI FOG 6CC (MISCELLANEOUS) ×3 IMPLANT
SPONGE GAUZE 4X4 12PLY STER LF (GAUZE/BANDAGES/DRESSINGS) ×3 IMPLANT
SUT PROLENE 3 0 SH DA (SUTURE) IMPLANT
SUT PROLENE 4 0 RB 1 (SUTURE)
SUT PROLENE 4-0 RB1 .5 CRCL 36 (SUTURE) IMPLANT
SUT SILK  1 MH (SUTURE) ×7
SUT SILK 1 MH (SUTURE) ×14 IMPLANT
SUT SILK 2 0SH CR/8 30 (SUTURE) IMPLANT
SUT SILK 3 0SH CR/8 30 (SUTURE) IMPLANT
SUT VIC AB 1 CTX 18 (SUTURE) ×3 IMPLANT
SUT VIC AB 1 CTX 36 (SUTURE)
SUT VIC AB 1 CTX36XBRD ANBCTR (SUTURE) IMPLANT
SUT VIC AB 2-0 CTX 36 (SUTURE) ×3 IMPLANT
SUT VIC AB 3-0 X1 27 (SUTURE) ×3 IMPLANT
SUT VICRYL 2 TP 1 (SUTURE) IMPLANT
SWAB COLLECTION DEVICE MRSA (MISCELLANEOUS) IMPLANT
SYR 20ML ECCENTRIC (SYRINGE) ×3 IMPLANT
SYR 50ML SLIP (SYRINGE) ×3 IMPLANT
SYSTEM SAHARA CHEST DRAIN ATS (WOUND CARE) ×3 IMPLANT
TAPE CLOTH 4X10 WHT NS (GAUZE/BANDAGES/DRESSINGS) ×3 IMPLANT
TAPE CLOTH SURG 4X10 WHT LF (GAUZE/BANDAGES/DRESSINGS) ×3 IMPLANT
TIP APPLICATOR SPRAY EXTEND 16 (VASCULAR PRODUCTS) IMPLANT
TOWEL OR 17X24 6PK STRL BLUE (TOWEL DISPOSABLE) ×3 IMPLANT
TOWEL OR 17X26 10 PK STRL BLUE (TOWEL DISPOSABLE) ×3 IMPLANT
TRAP SPECIMEN MUCOUS 40CC (MISCELLANEOUS) ×15 IMPLANT
TRAY FOLEY CATH 14FRSI W/METER (CATHETERS) ×3 IMPLANT
TROCAR BLADELESS 5MM (ENDOMECHANICALS) ×3 IMPLANT
TROCAR XCEL BLUNT TIP 100MML (ENDOMECHANICALS) IMPLANT
TUBE ANAEROBIC SPECIMEN COL (MISCELLANEOUS) IMPLANT
TUBE CONNECTING 20X1/4 (TUBING) ×6 IMPLANT
WATER STERILE IRR 1000ML POUR (IV SOLUTION) ×6 IMPLANT

## 2014-06-27 NOTE — Transfer of Care (Signed)
Immediate Anesthesia Transfer of Care Note  Patient: [name] [name]  Procedure(s) Performed: Procedure(s): VIDEO BRONCHOSCOPY (N/A) VIDEO ASSISTED THORACOSCOPY (Right) EMPYEMA DRAINAGE (Right)  Patient Location: PACU  Anesthesia Type:General  Level of Consciousness: awake and alert   Airway & Oxygen Therapy: Patient Spontanous Breathing and Patient connected to face mask oxygen  Post-op Assessment: Report given to RN and Post -op Vital signs reviewed and stable  Post vital signs: Reviewed and stable  Last Vitals:  Filed Vitals:   06/27/14 0500  BP: 95/60  Pulse: 101  Temp:   Resp: 24    Complications: No apparent anesthesia complications

## 2014-06-27 NOTE — Progress Notes (Signed)
Patient ID: [name] [name], male   DOB: 1946-01-31, 69 y.o.   MRN: [id number]   SICU Evening Rounds:   Hemodynamically stable  sats 99%  Urine output good  CT output low  CBC    Component Value Date/Time   WBC 18.7* 06/26/2014 1715   RBC 3.65* 06/26/2014 1715   HGB 11.2* 06/26/2014 1715   HCT 33.1* 06/26/2014 1715   PLT 593* 06/26/2014 1715   MCV 90.7 06/26/2014 1715   MCH 30.7 06/26/2014 1715   MCHC 33.8 06/26/2014 1715   RDW 13.7 06/26/2014 1715   LYMPHSABS 1.1 06/26/2014 1715   MONOABS 1.7* 06/26/2014 1715   EOSABS 0.0 06/26/2014 1715   BASOSABS 0.0 06/26/2014 1715     BMET    Component Value Date/Time   NA 134* 06/26/2014 1715   K 4.9 06/26/2014 1715   CL 100 06/26/2014 1715   CO2 20 06/26/2014 1715   GLUCOSE 119* 06/26/2014 1715   BUN 20 06/26/2014 1715   CREATININE 1.35 06/26/2014 1715   CALCIUM 8.3* 06/26/2014 1715   GFRNONAA 52* 06/26/2014 1715   GFRAA 60* 06/26/2014 1715     A/P:  Stable postop course. Continue current plans

## 2014-06-27 NOTE — Anesthesia Postprocedure Evaluation (Signed)
  Anesthesia Post-op Note  Patient: [name] [name]  Procedure(s) Performed: Procedure(s): VIDEO BRONCHOSCOPY (N/A) VIDEO ASSISTED THORACOSCOPY (Right) EMPYEMA DRAINAGE (Right)  Patient Location: PACU  Anesthesia Type:General  Level of Consciousness: awake, alert , oriented and patient cooperative  Airway and Oxygen Therapy: Patient Spontanous Breathing and Patient connected to face mask oxygen  Post-op Pain: mild  Post-op Assessment: Post-op Vital signs reviewed, Patient's Cardiovascular Status Stable, Respiratory Function Stable, Patent Airway, No signs of Nausea or vomiting and Pain level controlled  Post-op Vital Signs: Reviewed and stable  Last Vitals:  Filed Vitals:   06/27/14 1150  BP: 95/58  Pulse:   Temp:   Resp: 23    Complications: No apparent anesthesia complications

## 2014-06-27 NOTE — Anesthesia Procedure Notes (Addendum)
Anesthesia Procedure Note CVP: Timeout, sterile prep, drape, FBP R neck. 1% lido local, finder and trocar RIJ 1st pass with US guidance.  2 lumen placed over J wire. Biopatch and sterile dressing on.  Patient tolerated well.  VSS.  Jenita Seashore, MD [phone] Procedure Name: Intubation Date/Time: 06/27/2014 7:54 AM Performed by: Susa Loffler Pre-anesthesia Checklist: Patient identified, Emergency Drugs available, Suction available, Patient being monitored and Timeout performed Patient Re-evaluated:Patient Re-evaluated prior to inductionOxygen Delivery Method: Circle system utilized Preoxygenation: Pre-oxygenation with 100% oxygen Intubation Type: IV induction Ventilation: Oral airway inserted - appropriate to patient size and Mask ventilation without difficulty Laryngoscope Size: Mac and 4 Grade View: Grade II Tube type: Oral Tube size: 8.5 mm Number of attempts: 1 Airway Equipment and Method: Stylet Placement Confirmation: ETT inserted through vocal cords under direct vision,  positive ETCO2 and breath sounds checked- equal and bilateral Secured at: 23 cm Tube secured with: Tape Dental Injury: Teeth and Oropharynx as per pre-operative assessment    Anesthesia Procedure Note CVP: Timeout, sterile prep, drape, FBP R neck. 1% lido local, finder and trocar RIJ 1st pass with US guidance.  2 lumen placed over J wire. Biopatch and sterile dressing on.  Patient tolerated well.  VSS.  Jenita Seashore, MD [phone] Procedure Name: Intubation Date/Time: 06/27/2014 8:12 AM Performed by: Susa Loffler Pre-anesthesia Checklist: Patient identified, Timeout performed, Emergency Drugs available and Suction available Patient Re-evaluated:Patient Re-evaluated prior to inductionOxygen Delivery Method: Circle system utilized Preoxygenation: Pre-oxygenation with 100% oxygen Intubation Type: IV induction Ventilation: Mask ventilation without difficulty and Oral airway inserted - appropriate to patient  size Laryngoscope Size: 4, Glidescope and Mac Grade View: Grade I Tube type: Oral Endobronchial tube: Left, EBT position confirmed by fiberoptic bronchoscope and Double lumen EBT and 41 Fr Number of attempts: 2 Airway Equipment and Method: Stylet,  Video-laryngoscopy and Oral airway Placement Confirmation: ETT inserted through vocal cords under direct vision,  positive ETCO2 and breath sounds checked- equal and bilateral Secured at: 31 cm Tube secured with: Tape Dental Injury: Teeth and Oropharynx as per pre-operative assessment

## 2014-06-27 NOTE — Brief Op Note (Addendum)
      [address]       [place] [address]             [phone]    06/27/2014  10:13 AM  PATIENT:  [name] [name]  69 y.o. male  PRE-OPERATIVE DIAGNOSIS:  RIGHT EMPYEMA  POST-OPERATIVE DIAGNOSIS:  RIGHT EMPYEMA  PROCEDURE:   VIDEO BRONCHOSCOPY RIGHT VIDEO ASSISTED THORACOSCOPY  DRAINAGE OF EMPYEMA   DECORTICATION  SURGEON:  Surgeon(s): Grace Isaac, MD  ASSISTANT: Suzzanne Cloud, PA-C  ANESTHESIA:   general  SPECIMEN:  Source of Specimen:  Right pleural fluid and pleural peel  DISPOSITION OF SPECIMEN:  Pathology and micro  DRAINS: 3 #28 Blake drains  FINDINGS:  3 liters of empyema fluid drained  PATIENT CONDITION:  PACU - hemodynamically stable.

## 2014-06-27 NOTE — Anesthesia Preprocedure Evaluation (Addendum)
Anesthesia Evaluation  Patient identified by MRN, date of birth, ID band Patient awake    Reviewed: Allergy & Precautions, NPO status , Patient's Chart, lab work & pertinent test results  History of Anesthesia Complications Negative for: history of anesthetic complications  Airway Mallampati: II  TM Distance: >3 FB Neck ROM: Limited  Mouth opening: Limited Mouth Opening  Dental  (+) Dental Advisory Given, Poor Dentition   Pulmonary shortness of breath, with exertion, at rest and lying, pneumonia -, unresolved, COPDCurrent Smoker,  R empyema   + decreased breath sounds+ wheezing      Cardiovascular hypertension, Pt. on medications Rhythm:Regular Rate:Normal     Neuro/Psych negative neurological ROS     GI/Hepatic negative GI ROS, Neg liver ROS, Elevated LFTs with SIRS   Endo/Other  negative endocrine ROS  Renal/GU ARFRenal disease ( renal insufficiency with present infection)     Musculoskeletal   Abdominal   Peds  Hematology negative hematology ROS (+)   Anesthesia Other Findings   Reproductive/Obstetrics                            Anesthesia Physical Anesthesia Plan  ASA: III  Anesthesia Plan: General   Post-op Pain Management:    Induction: Intravenous  Airway Management Planned: Oral ETT and Double Lumen EBT  Additional Equipment: Arterial line and CVP  Intra-op Plan:   Post-operative Plan: Possible Post-op intubation/ventilation  Informed Consent: I have reviewed the patients History and Physical, chart, labs and discussed the procedure including the risks, benefits and alternatives for the proposed anesthesia with the patient or authorized representative who has indicated his/her understanding and acceptance.   Dental advisory given  Plan Discussed with: CRNA and Surgeon  Anesthesia Plan Comments: (Plan routine monitors, A line, CVP, GETA )        Anesthesia  Quick Evaluation

## 2014-06-27 NOTE — Progress Notes (Signed)
Utilization Review Completed.  

## 2014-06-28 ENCOUNTER — Inpatient Hospital Stay (HOSPITAL_COMMUNITY): Payer: Medicare Other

## 2014-06-28 LAB — BLOOD GAS, ARTERIAL
ACID-BASE DEFICIT: 0.1 mmol/L (ref 0.0–2.0)
BICARBONATE: 23.4 meq/L (ref 20.0–24.0)
Drawn by: 347621
O2 Content: 2 L/min
O2 Saturation: 95.6 %
PATIENT TEMPERATURE: 98.6
PCO2 ART: 34.2 mmHg — AB (ref 35.0–45.0)
PH ART: 7.45 (ref 7.350–7.450)
TCO2: 24.5 mmol/L (ref 0–100)
pO2, Arterial: 70.2 mmHg — ABNORMAL LOW (ref 80.0–100.0)

## 2014-06-28 LAB — BASIC METABOLIC PANEL
Anion gap: 10 (ref 5–15)
BUN: 22 mg/dL (ref 6–23)
CHLORIDE: 100 mmol/L (ref 96–112)
CO2: 22 mmol/L (ref 19–32)
Calcium: 7.7 mg/dL — ABNORMAL LOW (ref 8.4–10.5)
Creatinine, Ser: 1.19 mg/dL (ref 0.50–1.35)
GFR calc Af Amer: 70 mL/min — ABNORMAL LOW (ref >90)
GFR, EST NON AFRICAN AMERICAN: 61 mL/min — AB (ref >90)
Glucose, Bld: 117 mg/dL — ABNORMAL HIGH (ref 70–99)
Potassium: 4.6 mmol/L (ref 3.5–5.1)
Sodium: 132 mmol/L — ABNORMAL LOW (ref 135–145)

## 2014-06-28 LAB — URINE CULTURE
Colony Count: NO GROWTH
Culture: NO GROWTH
Special Requests: NORMAL

## 2014-06-28 LAB — CBC
HEMATOCRIT: 31.1 % — AB (ref 39.0–52.0)
Hemoglobin: 10.3 g/dL — ABNORMAL LOW (ref 13.0–17.0)
MCH: 29.8 pg (ref 26.0–34.0)
MCHC: 33.1 g/dL (ref 30.0–36.0)
MCV: 89.9 fL (ref 78.0–100.0)
Platelets: 494 10*3/uL — ABNORMAL HIGH (ref 150–400)
RBC: 3.46 MIL/uL — AB (ref 4.22–5.81)
RDW: 14 % (ref 11.5–15.5)
WBC: 20.8 10*3/uL — AB (ref 4.0–10.5)

## 2014-06-28 MED ORDER — ENOXAPARIN SODIUM 40 MG/0.4ML ~~LOC~~ SOLN
40.0000 mg | Freq: Every day | SUBCUTANEOUS | Status: DC
  Administered 2014-06-28 – 2014-07-04 (×7): 40 mg via SUBCUTANEOUS
  Filled 2014-06-28 (×7): qty 0.4

## 2014-06-28 MED ORDER — GUAIFENESIN ER 600 MG PO TB12
600.0000 mg | ORAL_TABLET | Freq: Two times a day (BID) | ORAL | Status: DC
  Administered 2014-06-28 – 2014-07-04 (×12): 600 mg via ORAL
  Filled 2014-06-28 (×14): qty 1

## 2014-06-28 NOTE — Progress Notes (Signed)
Patient ID: [name] [name], male   DOB: 1945/05/29, 69 y.o.   MRN: [id number]  SICU Evening Rounds:  Hemodynamically stable on low dose neo  Ambulating.

## 2014-06-28 NOTE — Progress Notes (Signed)
1 Day Post-Op Procedure(s) (LRB): VIDEO BRONCHOSCOPY (N/A) VIDEO ASSISTED THORACOSCOPY (Right) EMPYEMA DRAINAGE (Right) Subjective:  Feels better  Objective: Vital signs in last 24 hours: Temp:  [97 F (36.1 C)-98.4 F (36.9 C)] 98.4 F (36.9 C) (04/09 0834) Pulse Rate:  [43-105] 94 (04/09 1100) Cardiac Rhythm:  [-] Normal sinus rhythm (04/09 1000) Resp:  [14-24] 18 (04/09 1150) BP: (98-124)/(46-65) 122/56 mmHg (04/09 1100) SpO2:  [90 %-100 %] 94 % (04/09 1150) Arterial Line BP: (90-157)/(39-79) 131/46 mmHg (04/09 1100) Weight:  [87.408 kg (192 lb 11.2 oz)] 87.408 kg (192 lb 11.2 oz) (04/09 0700)  Hemodynamic parameters for last 24 hours:    Intake/Output from previous day: 04/08 0701 - 04/09 0700 In: 5701 [P.O.:240; I.V.:3700; IV Piggyback:600] Out: 3125 [Urine:2025; Chest Tube:1100] Intake/Output this shift: Total I/O In: 450 [I.V.:400; IV Piggyback:50] Out: 255 [Urine:195; Chest Tube:60]  General appearance: alert and cooperative Heart: regular rate and rhythm, S1, S2 normal, no murmur, click, rub or gallop Lungs: diminished breath sounds RLL chest tube output low  Lab Results:  Recent Labs  06/26/14 1715 06/28/14 0515  WBC 18.7* 20.8*  HGB 11.2* 10.3*  HCT 33.1* 31.1*  PLT 593* 494*   BMET:  Recent Labs  06/26/14 1715 06/28/14 0515  NA 134* 132*  K 4.9 4.6  CL 100 100  CO2 20 22  GLUCOSE 119* 117*  BUN 20 22  CREATININE 1.35 1.19  CALCIUM 8.3* 7.7*    PT/INR:  Recent Labs  06/26/14 1715  LABPROT 15.1  INR 1.17   ABG    Component Value Date/Time   PHART 7.450 06/28/2014 0500   HCO3 23.4 06/28/2014 0500   TCO2 24.5 06/28/2014 0500   ACIDBASEDEF 0.1 06/28/2014 0500   O2SAT 95.6 06/28/2014 0500   CBG (last 3)  No results for input(s): GLUCAP in the last 72 hours.  CXR: postop changes on the right but looks much better than preop. Some pleural thickening but no effusion.  Assessment/Plan: S/P Procedure(s) (LRB): VIDEO BRONCHOSCOPY  (N/A) VIDEO ASSISTED THORACOSCOPY (Right) EMPYEMA DRAINAGE (Right)  Hemodynamically stable Continue antibiotics Keep tubes in IS, ambulation   LOS: 2 days    Gaye Pollack 06/28/2014

## 2014-06-29 ENCOUNTER — Inpatient Hospital Stay (HOSPITAL_COMMUNITY): Payer: Medicare Other

## 2014-06-29 LAB — COMPREHENSIVE METABOLIC PANEL
ALT: 60 U/L — ABNORMAL HIGH (ref 0–53)
ANION GAP: 4 — AB (ref 5–15)
AST: 47 U/L — ABNORMAL HIGH (ref 0–37)
Albumin: 1.4 g/dL — ABNORMAL LOW (ref 3.5–5.2)
Alkaline Phosphatase: 60 U/L (ref 39–117)
BUN: 19 mg/dL (ref 6–23)
CO2: 28 mmol/L (ref 19–32)
Calcium: 7.4 mg/dL — ABNORMAL LOW (ref 8.4–10.5)
Chloride: 99 mmol/L (ref 96–112)
Creatinine, Ser: 1.19 mg/dL (ref 0.50–1.35)
GFR, EST AFRICAN AMERICAN: 70 mL/min — AB (ref >90)
GFR, EST NON AFRICAN AMERICAN: 61 mL/min — AB (ref >90)
Glucose, Bld: 122 mg/dL — ABNORMAL HIGH (ref 70–99)
Potassium: 4.6 mmol/L (ref 3.5–5.1)
SODIUM: 131 mmol/L — AB (ref 135–145)
Total Bilirubin: 0.4 mg/dL (ref 0.3–1.2)
Total Protein: 5.5 g/dL — ABNORMAL LOW (ref 6.0–8.3)

## 2014-06-29 LAB — CBC
HCT: 28.6 % — ABNORMAL LOW (ref 39.0–52.0)
Hemoglobin: 9.6 g/dL — ABNORMAL LOW (ref 13.0–17.0)
MCH: 30.2 pg (ref 26.0–34.0)
MCHC: 33.6 g/dL (ref 30.0–36.0)
MCV: 89.9 fL (ref 78.0–100.0)
PLATELETS: 470 10*3/uL — AB (ref 150–400)
RBC: 3.18 MIL/uL — AB (ref 4.22–5.81)
RDW: 13.8 % (ref 11.5–15.5)
WBC: 17.5 10*3/uL — ABNORMAL HIGH (ref 4.0–10.5)

## 2014-06-29 LAB — CULTURE, RESPIRATORY W GRAM STAIN: Culture: NO GROWTH

## 2014-06-29 LAB — CULTURE, RESPIRATORY

## 2014-06-29 NOTE — Plan of Care (Signed)
Problem: Phase I Progression Outcomes Goal: Pain controlled with appropriate interventions Outcome: Progressing Pain managed via patient controlled analgesic.  Will attempt to transition to PO pain management. Goal: If Diabetic, blood sugar < 150 Outcome: Not Applicable Date Met:  00/92/00 Patient is a non-diabetic.

## 2014-06-29 NOTE — Progress Notes (Signed)
Wasted 63ml of Fentanyl PCA syringe down the sink with Sula Soda, RN   Maia Petties, RN

## 2014-06-29 NOTE — Progress Notes (Signed)
Patient ID: [name] [name], male   DOB: September 01, 1945, 69 y.o.   MRN: [id number]  SICU Evening Rounds:  Stable day. Pain under control Urine ouput ok with foley out.

## 2014-06-29 NOTE — Progress Notes (Signed)
2 Days Post-Op Procedure(s) (LRB): VIDEO BRONCHOSCOPY (N/A) VIDEO ASSISTED THORACOSCOPY (Right) EMPYEMA DRAINAGE (Right) Subjective:  No complaints. Pain controlled on Oxy IR. Not using PCA  Ambulating  Slept well  Objective: Vital signs in last 24 hours: Temp:  [97.5 F (36.4 C)-98.5 F (36.9 C)] 97.6 F (36.4 C) (04/10 0823) Pulse Rate:  [59-102] 86 (04/10 0800) Cardiac Rhythm:  [-] Normal sinus rhythm (04/10 1000) Resp:  [14-20] 18 (04/10 0800) BP: (104-134)/(49-68) 116/59 mmHg (04/10 0800) SpO2:  [63 %-99 %] 94 % (04/10 0800) Arterial Line BP: (115-131)/(46-50) 115/47 mmHg (04/09 1300) Weight:  [89.2 kg (196 lb 10.4 oz)] 89.2 kg (196 lb 10.4 oz) (04/10 0600)  Hemodynamic parameters for last 24 hours:    Intake/Output from previous day: 04/09 0701 - 04/10 0700 In: 2581.3 [I.V.:1931.3; IV Piggyback:650] Out: 3976 [Urine:2200; Chest Tube:270] Intake/Output this shift: Total I/O In: 275 [I.V.:225; IV Piggyback:50] Out: 110 [Urine:110]  General appearance: alert and cooperative Heart: regular rate and rhythm, S1, S2 normal, no murmur, click, rub or gallop Lungs: diminished breath sounds RLL Extremities: extremities normal, atraumatic, no cyanosis or edema Wound: dressing dry  Lab Results:  Recent Labs  06/28/14 0515 06/29/14 0551  WBC 20.8* 17.5*  HGB 10.3* 9.6*  HCT 31.1* 28.6*  PLT 494* 470*   BMET:  Recent Labs  06/28/14 0515 06/29/14 0551  NA 132* 131*  K 4.6 4.6  CL 100 99  CO2 22 28  GLUCOSE 117* 122*  BUN 22 19  CREATININE 1.19 1.19  CALCIUM 7.7* 7.4*    PT/INR:  Recent Labs  06/26/14 1715  LABPROT 15.1  INR 1.17   ABG    Component Value Date/Time   PHART 7.450 06/28/2014 0500   HCO3 23.4 06/28/2014 0500   TCO2 24.5 06/28/2014 0500   ACIDBASEDEF 0.1 06/28/2014 0500   O2SAT 95.6 06/28/2014 0500   CBG (last 3)  No results for input(s): GLUCAP in the last 72 hours.  Assessment/Plan: S/P Procedure(s) (LRB): VIDEO BRONCHOSCOPY  (N/A) VIDEO ASSISTED THORACOSCOPY (Right) EMPYEMA DRAINAGE (Right)  He has been hemodynamically stable, afebrile.  On vanc and zosyn for pneumonia and empyema. Cultures negative so far.  Chest tube output low. Remove anterior tube.  Continue IS, ambulation  DC foley, PCA.   LOS: 3 days    Gaye Pollack 06/29/2014

## 2014-06-29 NOTE — Progress Notes (Signed)
ANTIBIOTIC CONSULT NOTE - FOLLOW UP  Pharmacy Consult for vancomycin and Zosyn Indication: pneumonia and empyema  No Known Allergies  Patient Measurements: Height: 5\' 11"  (180.3 cm) Weight: 196 lb 10.4 oz (89.2 kg) IBW/kg (Calculated) : 75.3  Vital Signs: Temp: 97.4 F (36.3 C) (04/10 1519) Temp Source: Oral (04/10 1519) BP: 121/60 mmHg (04/10 1300) Pulse Rate: 94 (04/10 1300) Intake/Output from previous day: 04/09 0701 - 04/10 0700 In: 2581.3 [I.V.:1931.3; IV Piggyback:650] Out: 3235 [Urine:2200; Chest Tube:270] Intake/Output from this shift: Total I/O In: 1328.3 [P.O.:960; I.V.:318.3; IV Piggyback:50] Out: 495 [Urine:385; Chest Tube:110]  Labs:  Recent Labs  06/26/14 1715 06/28/14 0515 06/29/14 0551  WBC 18.7* 20.8* 17.5*  HGB 11.2* 10.3* 9.6*  PLT 593* 494* 470*  CREATININE 1.35 1.19 1.19   Estimated Creatinine Clearance: 62.4 mL/min (by C-G formula based on Cr of 1.19). No results for input(s): VANCOTROUGH, VANCOPEAK, VANCORANDOM, GENTTROUGH, GENTPEAK, GENTRANDOM, TOBRATROUGH, TOBRAPEAK, TOBRARND, AMIKACINPEAK, AMIKACINTROU, AMIKACIN in the last 72 hours.   Microbiology: Recent Results (from the past 720 hour(s))  Culture, blood (routine x 2) Call MD if unable to obtain prior to antibiotics being given     Status: None   Collection Time: 06/07/14  9:53 PM  Result Value Ref Range Status   Specimen Description BLOOD RIGHT ANTECUBITAL  Final   Special Requests BOTTLES DRAWN AEROBIC AND ANAEROBIC 6CC  Final   Culture NO GROWTH 5 DAYS  Final   Report Status 06/12/2014 FINAL  Final  Culture, blood (routine x 2) Call MD if unable to obtain prior to antibiotics being given     Status: None   Collection Time: 06/07/14 10:03 PM  Result Value Ref Range Status   Specimen Description BLOOD RIGHT HAND  Final   Special Requests BOTTLES DRAWN AEROBIC ONLY [place]  Final   Culture NO GROWTH 5 DAYS  Final   Report Status 06/12/2014 FINAL  Final  Culture, sputum-assessment      Status: None   Collection Time: 06/10/14  7:14 AM  Result Value Ref Range Status   Specimen Description SPUTUM EXPECTORATED  Final   Special Requests NONE  Final   Sputum evaluation   Final    THIS SPECIMEN IS ACCEPTABLE. RESPIRATORY CULTURE REPORT TO FOLLOW. Performed at [facility]    Report Status 06/10/2014 FINAL  Final  Culture, respiratory (NON-Expectorated)     Status: None   Collection Time: 06/10/14  7:14 AM  Result Value Ref Range Status   Specimen Description SPUTUM EXPECTORATED  Final   Special Requests NONE  Final   Gram Stain   Final    MODERATE WBC PRESENT, PREDOMINANTLY PMN MODERATE SQUAMOUS EPITHELIAL CELLS PRESENT ABUNDANT GRAM POSITIVE COCCI IN PAIRS IN CHAINS FEW GRAM NEGATIVE RODS FEW GRAM POSITIVE RODS    Culture   Final    NORMAL OROPHARYNGEAL FLORA Performed at [facility]    Report Status 06/13/2014 FINAL  Final  Blood Culture (routine x 2)     Status: None (Preliminary result)   Collection Time: 06/26/14  5:10 PM  Result Value Ref Range Status   Specimen Description BLOOD RIGHT ARM  Final   Special Requests BOTTLES DRAWN AEROBIC ONLY 2CC  Final   Culture   Final           BLOOD CULTURE RECEIVED NO GROWTH TO DATE CULTURE WILL BE HELD FOR 5 DAYS BEFORE ISSUING A FINAL NEGATIVE REPORT Performed at [facility]    Report Status PENDING  Incomplete  Blood  Culture (routine x 2)     Status: None (Preliminary result)   Collection Time: 06/26/14  5:15 PM  Result Value Ref Range Status   Specimen Description BLOOD LEFT ANTECUBITAL  Final   Special Requests BOTTLES DRAWN AEROBIC AND ANAEROBIC 10CC  Final   Culture   Final           BLOOD CULTURE RECEIVED NO GROWTH TO DATE CULTURE WILL BE HELD FOR 5 DAYS BEFORE ISSUING A FINAL NEGATIVE REPORT Performed at [facility]    Report Status PENDING  Incomplete  MRSA PCR Screening     Status: None   Collection Time: 06/26/14  5:59 PM  Result Value Ref Range Status   MRSA by PCR  NEGATIVE NEGATIVE Final    Comment:        The GeneXpert MRSA Assay (FDA approved for NASAL specimens only), is one component of a comprehensive MRSA colonization surveillance program. It is not intended to diagnose MRSA infection nor to guide or monitor treatment for MRSA infections.   Urine culture     Status: None   Collection Time: 06/26/14  6:45 PM  Result Value Ref Range Status   Specimen Description URINE, CLEAN CATCH  Final   Special Requests Normal  Final   Colony Count NO GROWTH Performed at [facility]   Final   Culture NO GROWTH Performed at [facility]   Final   Report Status 06/28/2014 FINAL  Final  Fungus Culture with Smear     Status: None (Preliminary result)   Collection Time: 06/27/14  8:01 AM  Result Value Ref Range Status   Specimen Description BRONCHIAL WASHINGS RIGHT  Final   Special Requests NONE  Final   Fungal Smear   Final    NO YEAST OR FUNGAL ELEMENTS SEEN Performed at [facility]    Culture   Final    CULTURE IN PROGRESS FOR FOUR WEEKS Performed at [facility]    Report Status PENDING  Incomplete  Gram stain     Status: None   Collection Time: 06/27/14  8:01 AM  Result Value Ref Range Status   Specimen Description BRONCHIAL WASHINGS RIGHT  Final   Special Requests NONE  Final   Gram Stain   Final    FEW WBC PRESENT, PREDOMINANTLY PMN NO ORGANISMS SEEN    Report Status 06/27/2014 FINAL  Final  AFB culture with smear     Status: None (Preliminary result)   Collection Time: 06/27/14  8:01 AM  Result Value Ref Range Status   Specimen Description BRONCHIAL WASHINGS RIGHT  Final   Special Requests NONE  Final   Acid Fast Smear   Final    NO ACID FAST BACILLI SEEN Performed at [facility]    Culture   Final    CULTURE WILL BE EXAMINED FOR 6 WEEKS BEFORE ISSUING A FINAL REPORT Performed at [facility]    Report Status PENDING  Incomplete  Culture, respiratory (NON-Expectorated)      Status: None   Collection Time: 06/27/14  8:01 AM  Result Value Ref Range Status   Specimen Description BRONCHIAL WASHINGS RIGHT  Final   Special Requests NONE  Final   Gram Stain   Final    FEW WBC PRESENT, PREDOMINANTLY PMN NO ORGANISMS SEEN Performed at [facility] Performed at [facility]    Culture   Final    NO GROWTH 2 DAYS Performed at [facility]  Report Status 06/29/2014 FINAL  Final  Fungus Culture with Smear     Status: None (Preliminary result)   Collection Time: 06/27/14  8:44 AM  Result Value Ref Range Status   Specimen Description FLUID PLEURAL RIGHT  Final   Special Requests NONE  Final   Fungal Smear   Final    NO YEAST OR FUNGAL ELEMENTS SEEN Performed at [facility]    Culture   Final    CULTURE IN PROGRESS FOR FOUR WEEKS Performed at [facility]    Report Status PENDING  Incomplete  Gram stain     Status: None   Collection Time: 06/27/14  8:44 AM  Result Value Ref Range Status   Specimen Description FLUID PLEURAL RIGHT  Final   Special Requests NONE  Final   Gram Stain   Final    ABUNDANT WBC PRESENT,BOTH PMN AND MONONUCLEAR NO ORGANISMS SEEN    Report Status 06/27/2014 FINAL  Final  Body fluid culture     Status: None (Preliminary result)   Collection Time: 06/27/14  8:44 AM  Result Value Ref Range Status   Specimen Description FLUID PLEURAL RIGHT  Final   Special Requests NONE  Final   Gram Stain   Final    ABUNDANT WBC PRESENT, PREDOMINANTLY PMN NO ORGANISMS SEEN Performed at [facility]    Culture   Final    NO GROWTH 2 DAYS Performed at [facility]    Report Status PENDING  Incomplete  Anaerobic culture     Status: None (Preliminary result)   Collection Time: 06/27/14  9:08 AM  Result Value Ref Range Status   Specimen Description TISSUE PLEURAL RIGHT  Final   Special Requests NONE  Final   Gram Stain   Final    ABUNDANT WBC PRESENT, PREDOMINANTLY PMN ABUNDANT GRAM  POSITIVE COCCI IN PAIRS IN CHAINS Performed at [facility] Performed at [facility]    Culture   Final    NO ANAEROBES ISOLATED; CULTURE IN PROGRESS FOR 5 DAYS Performed at [facility]    Report Status PENDING  Incomplete  Tissue culture     Status: None (Preliminary result)   Collection Time: 06/27/14  9:08 AM  Result Value Ref Range Status   Specimen Description TISSUE PLEURAL RIGHT  Final   Special Requests NONE  Final   Gram Stain   Final    ABUNDANT WBC PRESENT, PREDOMINANTLY PMN ABUNDANT GRAM POSITIVE COCCI IN PAIRS IN CHAINS Performed at [facility] Performed at [place]   Final    Culture reincubated for better growth Performed at [facility]    Report Status PENDING  Incomplete  AFB culture with smear     Status: None (Preliminary result)   Collection Time: 06/27/14  9:08 AM  Result Value Ref Range Status   Specimen Description TISSUE PLEURAL RIGHT  Final   Special Requests NONE  Final   Acid Fast Smear   Final    NO ACID FAST BACILLI SEEN Performed at [facility]    Culture   Final    CULTURE WILL BE EXAMINED FOR 6 WEEKS BEFORE ISSUING A FINAL REPORT Performed at [facility]    Report Status PENDING  Incomplete  Gram stain     Status: None   Collection Time: 06/27/14  9:08 AM  Result Value Ref Range Status   Specimen Description TISSUE PLEURAL RIGHT  Final  Special Requests NONE  Final   Gram Stain   Final    ABUNDANT WBC PRESENT, PREDOMINANTLY PMN ABUNDANT GRAM POSITIVE COCCI IN PAIRS IN CHAINS    Report Status 06/27/2014 FINAL  Final  Anaerobic culture     Status: None (Preliminary result)   Collection Time: 06/27/14  9:14 AM  Result Value Ref Range Status   Specimen Description FLUID PLEURAL RIGHT  Final   Special Requests ON SWAB  Final   Gram Stain PENDING  Incomplete   Culture   Final    NO ANAEROBES ISOLATED; CULTURE IN PROGRESS FOR 5 DAYS Performed at [facility]    Report Status PENDING  Incomplete    Anti-infectives    Start     Dose/Rate Route Frequency Ordered Stop   06/27/14 0600  vancomycin (VANCOCIN) IVPB 750 mg/150 ml premix     750 mg 150 mL/hr over 60 Minutes Intravenous Every 12 hours 06/26/14 2032     06/26/14 1800  vancomycin (VANCOCIN) IVPB 1000 mg/200 mL premix  Status:  Discontinued     1,000 mg 200 mL/hr over 60 Minutes Intravenous Every 12 hours 06/26/14 1639 06/26/14 2032   06/26/14 1700  piperacillin-tazobactam (ZOSYN) IVPB 3.375 g     3.375 g 12.5 mL/hr over 240 Minutes Intravenous Every 8 hours 06/26/14 1639     06/26/14 1630  piperacillin-tazobactam (ZOSYN) IVPB 3.375 g  Status:  Discontinued     3.375 g 100 mL/hr over 30 Minutes Intravenous  Once 06/26/14 1626 06/26/14 1628   06/26/14 1630  vancomycin (VANCOCIN) IVPB 1000 mg/200 mL premix  Status:  Discontinued     1,000 mg 200 mL/hr over 60 Minutes Intravenous  Once 06/26/14 1626 06/26/14 1628      Assessment: 69 y/o male on day 4 vancomycin and Zosyn for PNA and empyema s/p VATS on 4/8. Renal function is stable, Tm 99.2, and WBC are trending down. Cultures data is negative so far.  4/7 vanc>>  4/7 zosyn>>   4/7 bld x2>> ngtd  4/7 urine>> neg  4/8 pl fluid>> ngtd  4/8 resp cx>> neg  4/8 tissue cx>> ngtd  Goal of Therapy:  Vancomycin trough level 15-20 mcg/ml Eradication of infection  Plan:  - Continue vancomycin 750 mg IV q12h - Continue Zosyn 3.375 g IV q8h (infuse over 4 hrs) - Monitor renal function - Check vancomycin trough if not discontinued soon  [facility], [place].D., BCPS Clinical Pharmacist Pager: [phone] 06/29/2014 3:34 PM

## 2014-06-30 ENCOUNTER — Inpatient Hospital Stay (HOSPITAL_COMMUNITY): Payer: Medicare Other

## 2014-06-30 ENCOUNTER — Encounter (HOSPITAL_COMMUNITY): Payer: Self-pay | Admitting: Cardiothoracic Surgery

## 2014-06-30 LAB — TISSUE CULTURE

## 2014-06-30 LAB — CBC
HCT: 27.4 % — ABNORMAL LOW (ref 39.0–52.0)
Hemoglobin: 9.2 g/dL — ABNORMAL LOW (ref 13.0–17.0)
MCH: 30.4 pg (ref 26.0–34.0)
MCHC: 33.6 g/dL (ref 30.0–36.0)
MCV: 90.4 fL (ref 78.0–100.0)
Platelets: 453 10*3/uL — ABNORMAL HIGH (ref 150–400)
RBC: 3.03 MIL/uL — ABNORMAL LOW (ref 4.22–5.81)
RDW: 13.6 % (ref 11.5–15.5)
WBC: 17.1 10*3/uL — AB (ref 4.0–10.5)

## 2014-06-30 LAB — BODY FLUID CULTURE: Culture: NO GROWTH

## 2014-06-30 LAB — VANCOMYCIN, TROUGH: Vancomycin Tr: 12.8 ug/mL (ref 10.0–20.0)

## 2014-06-30 MED ORDER — VANCOMYCIN HCL IN DEXTROSE 1-5 GM/200ML-% IV SOLN
1000.0000 mg | Freq: Two times a day (BID) | INTRAVENOUS | Status: DC
  Administered 2014-06-30 – 2014-07-01 (×2): 1000 mg via INTRAVENOUS
  Filled 2014-06-30 (×4): qty 200

## 2014-06-30 MED ORDER — KETOROLAC TROMETHAMINE 15 MG/ML IJ SOLN
15.0000 mg | Freq: Four times a day (QID) | INTRAMUSCULAR | Status: AC
  Administered 2014-06-30 (×2): 15 mg via INTRAVENOUS
  Filled 2014-06-30 (×2): qty 1

## 2014-06-30 MED ORDER — LACTULOSE 10 GM/15ML PO SOLN
20.0000 g | Freq: Every day | ORAL | Status: AC
  Administered 2014-06-30: 20 g via ORAL
  Filled 2014-06-30 (×2): qty 30

## 2014-06-30 NOTE — Plan of Care (Signed)
Problem: Phase III Progression Outcomes Goal: Transferred to Telemetry Outcome: Progressing Orders to SDU.

## 2014-06-30 NOTE — Plan of Care (Signed)
Problem: Phase II Progression Outcomes Goal: Pain controlled Outcome: Progressing Patient transitioned from IV medication to PO meds.  06/30/2014  Problem: Phase III Progression Outcomes Goal: Tolerating diet Outcome: Progressing Progress from clear liquids to carb modified diet.

## 2014-06-30 NOTE — Plan of Care (Signed)
Problem: Phase II Progression Outcomes Goal: Pain controlled Outcome: Progressing Toradol added today for pain management.

## 2014-06-30 NOTE — Progress Notes (Signed)
ANTIBIOTIC CONSULT NOTE - FOLLOW UP  Pharmacy Consult for vancomycin and Zosyn Indication: pneumonia and empyema  No Known Allergies  Patient Measurements: Height: 5\' 11"  (180.3 cm) Weight: 198 lb 6.6 oz (90 kg) IBW/kg (Calculated) : 75.3  Vital Signs: Temp: 98 F (36.7 C) (04/11 1531) Temp Source: Oral (04/11 1531) BP: 137/67 mmHg (04/11 1900) Pulse Rate: 96 (04/11 1900) Intake/Output from previous day: 04/10 0701 - 04/11 0700 In: 1878.3 [P.O.:960; I.V.:468.3; IV Piggyback:450] Out: 3125 [Urine:2935; Chest Tube:190] Intake/Output from this shift:    Labs:  Recent Labs  06/28/14 0515 06/29/14 0551 06/30/14 0300  WBC 20.8* 17.5* 17.1*  HGB 10.3* 9.6* 9.2*  PLT 494* 470* 453*  CREATININE 1.19 1.19  --    Estimated Creatinine Clearance: 62.4 mL/min (by C-G formula based on Cr of 1.19).  Recent Labs  06/30/14 1720  VANCOTROUGH 12.8     Microbiology: Recent Results (from the past 720 hour(s))  Culture, blood (routine x 2) Call MD if unable to obtain prior to antibiotics being given     Status: None   Collection Time: 06/07/14  9:53 PM  Result Value Ref Range Status   Specimen Description BLOOD RIGHT ANTECUBITAL  Final   Special Requests BOTTLES DRAWN AEROBIC AND ANAEROBIC 6CC  Final   Culture NO GROWTH 5 DAYS  Final   Report Status 06/12/2014 FINAL  Final  Culture, blood (routine x 2) Call MD if unable to obtain prior to antibiotics being given     Status: None   Collection Time: 06/07/14 10:03 PM  Result Value Ref Range Status   Specimen Description BLOOD RIGHT HAND  Final   Special Requests BOTTLES DRAWN AEROBIC ONLY [place]  Final   Culture NO GROWTH 5 DAYS  Final   Report Status 06/12/2014 FINAL  Final  Culture, sputum-assessment     Status: None   Collection Time: 06/10/14  7:14 AM  Result Value Ref Range Status   Specimen Description SPUTUM EXPECTORATED  Final   Special Requests NONE  Final   Sputum evaluation   Final    THIS SPECIMEN IS ACCEPTABLE.  RESPIRATORY CULTURE REPORT TO FOLLOW. Performed at [facility]    Report Status 06/10/2014 FINAL  Final  Culture, respiratory (NON-Expectorated)     Status: None   Collection Time: 06/10/14  7:14 AM  Result Value Ref Range Status   Specimen Description SPUTUM EXPECTORATED  Final   Special Requests NONE  Final   Gram Stain   Final    MODERATE WBC PRESENT, PREDOMINANTLY PMN MODERATE SQUAMOUS EPITHELIAL CELLS PRESENT ABUNDANT GRAM POSITIVE COCCI IN PAIRS IN CHAINS FEW GRAM NEGATIVE RODS FEW GRAM POSITIVE RODS    Culture   Final    NORMAL OROPHARYNGEAL FLORA Performed at [facility]    Report Status 06/13/2014 FINAL  Final  Blood Culture (routine x 2)     Status: None (Preliminary result)   Collection Time: 06/26/14  5:10 PM  Result Value Ref Range Status   Specimen Description BLOOD RIGHT ARM  Final   Special Requests BOTTLES DRAWN AEROBIC ONLY 2CC  Final   Culture   Final           BLOOD CULTURE RECEIVED NO GROWTH TO DATE CULTURE WILL BE HELD FOR 5 DAYS BEFORE ISSUING A FINAL NEGATIVE REPORT Performed at [facility]    Report Status PENDING  Incomplete  Blood Culture (routine x 2)     Status: None (Preliminary result)   Collection Time: 06/26/14  5:15 PM  Result Value Ref Range Status   Specimen Description BLOOD LEFT ANTECUBITAL  Final   Special Requests BOTTLES DRAWN AEROBIC AND ANAEROBIC 10CC  Final   Culture   Final           BLOOD CULTURE RECEIVED NO GROWTH TO DATE CULTURE WILL BE HELD FOR 5 DAYS BEFORE ISSUING A FINAL NEGATIVE REPORT Performed at [facility]    Report Status PENDING  Incomplete  MRSA PCR Screening     Status: None   Collection Time: 06/26/14  5:59 PM  Result Value Ref Range Status   MRSA by PCR NEGATIVE NEGATIVE Final    Comment:        The GeneXpert MRSA Assay (FDA approved for NASAL specimens only), is one component of a comprehensive MRSA colonization surveillance program. It is not intended to diagnose  MRSA infection nor to guide or monitor treatment for MRSA infections.   Urine culture     Status: None   Collection Time: 06/26/14  6:45 PM  Result Value Ref Range Status   Specimen Description URINE, CLEAN CATCH  Final   Special Requests Normal  Final   Colony Count NO GROWTH Performed at [facility]   Final   Culture NO GROWTH Performed at [facility]   Final   Report Status 06/28/2014 FINAL  Final  Fungus Culture with Smear     Status: None (Preliminary result)   Collection Time: 06/27/14  8:01 AM  Result Value Ref Range Status   Specimen Description BRONCHIAL WASHINGS RIGHT  Final   Special Requests NONE  Final   Fungal Smear   Final    NO YEAST OR FUNGAL ELEMENTS SEEN Performed at [facility]    Culture   Final    CULTURE IN PROGRESS FOR FOUR WEEKS Performed at [facility]    Report Status PENDING  Incomplete  Gram stain     Status: None   Collection Time: 06/27/14  8:01 AM  Result Value Ref Range Status   Specimen Description BRONCHIAL WASHINGS RIGHT  Final   Special Requests NONE  Final   Gram Stain   Final    FEW WBC PRESENT, PREDOMINANTLY PMN NO ORGANISMS SEEN    Report Status 06/27/2014 FINAL  Final  AFB culture with smear     Status: None (Preliminary result)   Collection Time: 06/27/14  8:01 AM  Result Value Ref Range Status   Specimen Description BRONCHIAL WASHINGS RIGHT  Final   Special Requests NONE  Final   Acid Fast Smear   Final    NO ACID FAST BACILLI SEEN Performed at [facility]    Culture   Final    CULTURE WILL BE EXAMINED FOR 6 WEEKS BEFORE ISSUING A FINAL REPORT Performed at [facility]    Report Status PENDING  Incomplete  Culture, respiratory (NON-Expectorated)     Status: None   Collection Time: 06/27/14  8:01 AM  Result Value Ref Range Status   Specimen Description BRONCHIAL WASHINGS RIGHT  Final   Special Requests NONE  Final   Gram Stain   Final    FEW WBC PRESENT,  PREDOMINANTLY PMN NO ORGANISMS SEEN Performed at [facility] Performed at [facility]    Culture   Final    NO GROWTH 2 DAYS Performed at [facility]    Report Status 06/29/2014 FINAL  Final  Fungus Culture with Smear     Status: None (  Preliminary result)   Collection Time: 06/27/14  8:44 AM  Result Value Ref Range Status   Specimen Description FLUID PLEURAL RIGHT  Final   Special Requests NONE  Final   Fungal Smear   Final    NO YEAST OR FUNGAL ELEMENTS SEEN Performed at [facility]    Culture   Final    CULTURE IN PROGRESS FOR FOUR WEEKS Performed at [facility]    Report Status PENDING  Incomplete  Gram stain     Status: None   Collection Time: 06/27/14  8:44 AM  Result Value Ref Range Status   Specimen Description FLUID PLEURAL RIGHT  Final   Special Requests NONE  Final   Gram Stain   Final    ABUNDANT WBC PRESENT,BOTH PMN AND MONONUCLEAR NO ORGANISMS SEEN    Report Status 06/27/2014 FINAL  Final  Body fluid culture     Status: None   Collection Time: 06/27/14  8:44 AM  Result Value Ref Range Status   Specimen Description FLUID PLEURAL RIGHT  Final   Special Requests NONE  Final   Gram Stain   Final    ABUNDANT WBC PRESENT, PREDOMINANTLY PMN NO ORGANISMS SEEN Performed at [facility]    Culture   Final    NO GROWTH 3 DAYS Performed at [facility]    Report Status 06/30/2014 FINAL  Final  Anaerobic culture     Status: None (Preliminary result)   Collection Time: 06/27/14  9:08 AM  Result Value Ref Range Status   Specimen Description TISSUE PLEURAL RIGHT  Final   Special Requests NONE  Final   Gram Stain   Final    ABUNDANT WBC PRESENT, PREDOMINANTLY PMN ABUNDANT GRAM POSITIVE COCCI IN PAIRS IN CHAINS Performed at [facility] Performed at [facility]    Culture   Final    NO ANAEROBES ISOLATED; CULTURE IN PROGRESS FOR 5 DAYS Performed at [facility]    Report  Status PENDING  Incomplete  Tissue culture     Status: None   Collection Time: 06/27/14  9:08 AM  Result Value Ref Range Status   Specimen Description TISSUE PLEURAL RIGHT  Final   Special Requests NONE  Final   Gram Stain   Final    ABUNDANT WBC PRESENT, PREDOMINANTLY PMN ABUNDANT GRAM POSITIVE COCCI IN PAIRS IN CHAINS Performed at [facility] Performed at [facility]    Culture   Final    ABUNDANT MICROAEROPHILIC STREPTOCOCCI Note: Standardized susceptibility testing for this organism is not available. Performed at [facility]    Report Status 06/30/2014 FINAL  Final  AFB culture with smear     Status: None (Preliminary result)   Collection Time: 06/27/14  9:08 AM  Result Value Ref Range Status   Specimen Description TISSUE PLEURAL RIGHT  Final   Special Requests NONE  Final   Acid Fast Smear   Final    NO ACID FAST BACILLI SEEN Performed at [facility]    Culture   Final    CULTURE WILL BE EXAMINED FOR 6 WEEKS BEFORE ISSUING A FINAL REPORT Performed at [facility]    Report Status PENDING  Incomplete  Gram stain     Status: None   Collection Time: 06/27/14  9:08 AM  Result Value Ref Range Status   Specimen Description TISSUE PLEURAL RIGHT  Final   Special Requests NONE  Final   Gram Stain  Final    ABUNDANT WBC PRESENT, PREDOMINANTLY PMN ABUNDANT GRAM POSITIVE COCCI IN PAIRS IN CHAINS    Report Status 06/27/2014 FINAL  Final  Anaerobic culture     Status: None (Preliminary result)   Collection Time: 06/27/14  9:14 AM  Result Value Ref Range Status   Specimen Description FLUID PLEURAL RIGHT  Final   Special Requests ON SWAB  Final   Gram Stain PENDING  Incomplete   Culture   Final    NO ANAEROBES ISOLATED; CULTURE IN PROGRESS FOR 5 DAYS Performed at [facility]    Report Status PENDING  Incomplete    Anti-infectives    Start     Dose/Rate Route Frequency Ordered Stop   06/27/14 0600  vancomycin (VANCOCIN)  IVPB 750 mg/150 ml premix     750 mg 150 mL/hr over 60 Minutes Intravenous Every 12 hours 06/26/14 2032     06/26/14 1800  vancomycin (VANCOCIN) IVPB 1000 mg/200 mL premix  Status:  Discontinued     1,000 mg 200 mL/hr over 60 Minutes Intravenous Every 12 hours 06/26/14 1639 06/26/14 2032   06/26/14 1700  piperacillin-tazobactam (ZOSYN) IVPB 3.375 g     3.375 g 12.5 mL/hr over 240 Minutes Intravenous Every 8 hours 06/26/14 1639     06/26/14 1630  piperacillin-tazobactam (ZOSYN) IVPB 3.375 g  Status:  Discontinued     3.375 g 100 mL/hr over 30 Minutes Intravenous  Once 06/26/14 1626 06/26/14 1628   06/26/14 1630  vancomycin (VANCOCIN) IVPB 1000 mg/200 mL premix  Status:  Discontinued     1,000 mg 200 mL/hr over 60 Minutes Intravenous  Once 06/26/14 1626 06/26/14 1628      Assessment: 69 y/o male on day #5 of Vancomycin and Zosyn for PNA and empyema s/p VATS on 4/8. Renal function is stable. Afeb. WBC down to nl.   Vancomycin trough 12.8 mcg/ml (subtherapeutic) on 750mg  IV q12h.  4/7 vanc>>  4/7 zosyn>>   4/7 bld x2>> ngtd  4/7 urine>> neg  4/8 pl fluid>> ngtd  4/8 resp cx>> neg  4/8 tissue cx>> microaerophilic strep  Goal of Therapy:  Vancomycin trough level 15-20 mcg/ml Eradication of infection  Plan:  - Change vancomycin to 1000 mg IV q12h - Continue Zosyn 3.375 g IV q8h (infuse over 4 hrs) - Monitor renal function  Sherlon Handing, PharmD, BCPS Clinical pharmacist, pager [phone] 06/30/2014 7:11 PM

## 2014-06-30 NOTE — Op Note (Signed)
NAMENEILS, SIRACUSA NO.:  [id number]  MEDICAL RECORD NO.:  [id number]  LOCATION:  2S03C                        FACILITY:  [facility]  PHYSICIAN:  Lanelle Bal, MD    DATE OF BIRTH:  03/12/1946  DATE OF PROCEDURE:  06/27/2014 DATE OF DISCHARGE:                              OPERATIVE REPORT   PREOPERATIVE DIAGNOSIS:  Postpneumonic empyema with probable sepsis.  POSTOPERATIVE DIAGNOSIS:  Postpneumonic empyema with probable sepsis.  SURGICAL PROCEDURE:  Video-assisted bronchoscopy, right video-assisted thoracoscopy, mini thoracotomy, drainage of extensive empyema, and decortication.  SURGEON:  Lanelle Bal, MD  FIRST ASSISTANT:  Suzzanne Cloud, PA  BRIEF HISTORY:  The patient is a 69 year old male who on March 20th was admitted to [facility] for several days with diagnosis of pneumonia.  He was discharged home on a course of antibiotics for approximately 10 days.  The patient after discharge continued to feel worse with intermittent fevers, chills, and overall feeling poorly.  He had called and made an appointment to see Dr. [name].  Prior to him seeing him, a CT scan of the chest was done that showed extensive empyema, and the patient was referred directly to me for evaluation and treatment.  The patient was seen in the office and was obviously in discomfort, tachycardic, and blood pressure was stable.  He was admitted directly to the surgical intensive care unit, started on fluid hydration, and broad- spectrum antibiotics, and made n.p.o., and brought to the operating room the morning after admission for bronchoscopy and drainage.  The patient agreed and signed informed consent prior to the procedure.  DESCRIPTION OF PROCEDURE:  The patient was brought to the operating room with central line and arterial line in place.  He underwent general endotracheal anesthesia with a single-lumen endotracheal tube.  Through the single-lumen endotracheal tube, a  fiberoptic bronchoscope was passed to the subsegmental level.  Bronchial washings were obtained and sent for cultures.  There were no endobronchial lesions were noted.  Scope was removed and double-lumen endotracheal tube was placed.  The patient was then turned in lateral decubitus position with the right side up. The right lung was collapsed and a small incision was made at approximately 7th intercostal space, mid axillary line.  Scope was introduced into the chest, and there was very extensive amount of foul- smelling fluid and numerous loculations.  We initially drained as much of the fluid out as we could and then enlarged the port incision and continued with debridement of extensive empyema.  Through anterior and posterior port site and a small incision we had made we were able to adequately drain and break up all loculations and drain the fluid. Extensive decortication of the visceral pleura was carried out and the parietal pleura.  With this completed, the chest was copiously irrigated.  Three Blake drains were left in place, 2 through the previous port sites and a third one was brought out through a separate site.  The small incision that we had made was then closed.  Muscle layers with interrupted 0-Vicryl, running 2-0 Vicryl in subcutaneous tissue, and a 3-0 subcuticular stitch.  Dry dressings were applied. Lung reinflated nicely, a total of almost 3 L  of fluid and debris had been removed.  Cytology, cultures, cell count, AFB smear and culture, and fungal smear and cultures were all sent, a portion of the tissue was sent for culture and for histology.  The patient tolerated the procedure well. Blood loss was approximately 200 to 300 mL.  The patient was extubated in the operating room, and transferred to the recovery room for further postoperative care.  Sponge and needle count was reported as correct at the completion of procedure.     Lanelle Bal, MD     EG/MEDQ   D:  06/29/2014  T:  06/30/2014  Job:  [id number]  cc:   Percell Miller L. [name], M.D.

## 2014-06-30 NOTE — Progress Notes (Signed)
3 Days Post-Op Procedure(s) (LRB): VIDEO BRONCHOSCOPY (N/A) VIDEO ASSISTED THORACOSCOPY (Right) EMPYEMA DRAINAGE (Right) Subjective: Still with sig incisional pain CXR reviewed Min CT drainage- will remove one CT today OR culture- gram pos cocci in pairs, chains- cont iv vanc- zozsyn tx to stepdown Objective: Vital signs in last 24 hours: Temp:  [97.4 F (36.3 C)-99.2 F (37.3 C)] 97.5 F (36.4 C) (04/11 0739) Pulse Rate:  [78-107] 95 (04/11 1000) Cardiac Rhythm:  [-] Normal sinus rhythm (04/11 0800) Resp:  [18] 18 (04/10 1200) BP: (90-137)/(50-110) 135/64 mmHg (04/11 1000) SpO2:  [93 %-100 %] 96 % (04/11 1000) Weight:  [198 lb 6.6 oz (90 kg)] 198 lb 6.6 oz (90 kg) (04/11 0200)  Hemodynamic parameters for last 24 hours:  nsr  Intake/Output from previous day: 04/10 0701 - 04/11 0700 In: 1878.3 [P.O.:960; I.V.:468.3; IV Piggyback:450] Out: 3125 [Urine:2935; Chest Tube:190] Intake/Output this shift: Total I/O In: 80 [I.V.:30; IV Piggyback:50] Out: 200 [Urine:200]  Breath sounds diminished on R  Lab Results:  Recent Labs  06/29/14 0551 06/30/14 0300  WBC 17.5* 17.1*  HGB 9.6* 9.2*  HCT 28.6* 27.4*  PLT 470* 453*   BMET:  Recent Labs  06/28/14 0515 06/29/14 0551  NA 132* 131*  K 4.6 4.6  CL 100 99  CO2 22 28  GLUCOSE 117* 122*  BUN 22 19  CREATININE 1.19 1.19  CALCIUM 7.7* 7.4*    PT/INR: No results for input(s): LABPROT, INR in the last 72 hours. ABG    Component Value Date/Time   PHART 7.450 06/28/2014 0500   HCO3 23.4 06/28/2014 0500   TCO2 24.5 06/28/2014 0500   ACIDBASEDEF 0.1 06/28/2014 0500   O2SAT 95.6 06/28/2014 0500   CBG (last 3)  No results for input(s): GLUCAP in the last 72 hours.  Assessment/Plan: S/P Procedure(s) (LRB): VIDEO BRONCHOSCOPY (N/A) VIDEO ASSISTED THORACOSCOPY (Right) EMPYEMA DRAINAGE (Right) Mobilize d/c tubes/lines Plan for transfer to step-down: see transfer orders   LOS: 4 days    [name] Aquas Trigt  [name] 06/30/2014

## 2014-06-30 NOTE — Progress Notes (Signed)
      [address]       ,[place] [id number]             [phone]      Up in chair  BP 140/58 mmHg  Pulse 93  Temp(Src) 98 F (36.7 C) (Oral)  Resp 18  Ht 5\' 11"  (1.803 m)  Wt 198 lb 6.6 oz (90 kg)  BMI 27.69 kg/m2  SpO2 100%   Intake/Output Summary (Last 24 hours) at 06/30/14 1731 Last data filed at 06/30/14 1600  Gross per 24 hour  Intake    870 ml  Output   2830 ml  Net  -1960 ml    Awaiting step down bed  Remo Lipps C. Roxan Hockey, MD [facility] [phone]

## 2014-06-30 NOTE — Plan of Care (Signed)
Problem: Phase III Progression Outcomes Goal: Ambulates with dyspnea controlled Outcome: Progressing Abulated 500 feet on room air. Patient tolerated well.

## 2014-07-01 ENCOUNTER — Inpatient Hospital Stay (HOSPITAL_COMMUNITY): Payer: Medicare Other

## 2014-07-01 LAB — BASIC METABOLIC PANEL
Anion gap: 8 (ref 5–15)
BUN: 19 mg/dL (ref 6–23)
CO2: 27 mmol/L (ref 19–32)
Calcium: 7.8 mg/dL — ABNORMAL LOW (ref 8.4–10.5)
Chloride: 100 mmol/L (ref 96–112)
Creatinine, Ser: 1.29 mg/dL (ref 0.50–1.35)
GFR calc Af Amer: 64 mL/min — ABNORMAL LOW (ref >90)
GFR calc non Af Amer: 55 mL/min — ABNORMAL LOW (ref >90)
Glucose, Bld: 111 mg/dL — ABNORMAL HIGH (ref 70–99)
Potassium: 4.4 mmol/L (ref 3.5–5.1)
Sodium: 135 mmol/L (ref 135–145)

## 2014-07-01 LAB — CBC
HCT: 28.8 % — ABNORMAL LOW (ref 39.0–52.0)
Hemoglobin: 9.6 g/dL — ABNORMAL LOW (ref 13.0–17.0)
MCH: 30.1 pg (ref 26.0–34.0)
MCHC: 33.3 g/dL (ref 30.0–36.0)
MCV: 90.3 fL (ref 78.0–100.0)
Platelets: 556 10*3/uL — ABNORMAL HIGH (ref 150–400)
RBC: 3.19 MIL/uL — ABNORMAL LOW (ref 4.22–5.81)
RDW: 13.8 % (ref 11.5–15.5)
WBC: 16.8 10*3/uL — ABNORMAL HIGH (ref 4.0–10.5)

## 2014-07-01 LAB — PATHOLOGIST SMEAR REVIEW

## 2014-07-01 MED ORDER — LISINOPRIL 20 MG PO TABS
20.0000 mg | ORAL_TABLET | Freq: Every day | ORAL | Status: DC
  Administered 2014-07-01 – 2014-07-04 (×4): 20 mg via ORAL
  Filled 2014-07-01 (×4): qty 1

## 2014-07-01 MED ORDER — ZOLPIDEM TARTRATE 5 MG PO TABS
5.0000 mg | ORAL_TABLET | Freq: Every evening | ORAL | Status: DC | PRN
  Administered 2014-07-01 – 2014-07-03 (×2): 5 mg via ORAL
  Filled 2014-07-01 (×2): qty 1

## 2014-07-01 NOTE — Progress Notes (Addendum)
[facility] DAILY ICU PROGRESS NOTE                   [address]            [place] [address]          [phone]   4 Days Post-Op Procedure(s) (LRB): VIDEO BRONCHOSCOPY (N/A) VIDEO ASSISTED THORACOSCOPY (Right) EMPYEMA DRAINAGE (Right)  Total Length of Stay:  LOS: 5 days   Subjective: Feeling better overall  Objective: Vital signs in last 24 hours: Temp:  [97.5 F (36.4 C)-98.5 F (36.9 C)] 98.3 F (36.8 C) (04/12 0750) Pulse Rate:  [78-105] 85 (04/12 0800) Cardiac Rhythm:  [-] Normal sinus rhythm (04/12 0750) BP: (107-159)/(51-81) 134/66 mmHg (04/12 0800) SpO2:  [93 %-100 %] 96 % (04/12 0800) Weight:  [195 lb 8.8 oz (88.7 kg)] 195 lb 8.8 oz (88.7 kg) (04/12 0300)  Filed Weights   06/29/14 0600 06/30/14 0200 07/01/14 0300  Weight: 196 lb 10.4 oz (89.2 kg) 198 lb 6.6 oz (90 kg) 195 lb 8.8 oz (88.7 kg)    Weight change: -2 lb 13.9 oz (-1.3 kg)   Hemodynamic parameters for last 24 hours:    Intake/Output from previous day: 04/11 0701 - 04/12 0700 In: 980 [P.O.:390; I.V.:240; IV Piggyback:350] Out: 1500 [Urine:1450; Chest Tube:50]  Intake/Output this shift: Total I/O In: 60 [I.V.:10; IV Piggyback:50] Out: -   Current Meds: Scheduled Meds: . acetaminophen  1,000 mg Oral 4 times per day   Or  . acetaminophen (TYLENOL) oral liquid 160 mg/5 mL  1,000 mg Oral 4 times per day  . acidophilus  1 capsule Oral Daily  . amLODipine  2.5 mg Oral Daily  . aspirin EC  81 mg Oral Daily  . bisacodyl  10 mg Oral Daily  . enoxaparin (LOVENOX) injection  40 mg Subcutaneous Daily  . guaiFENesin  600 mg Oral BID  . lactulose  20 g Oral Daily  . lisinopril  20 mg Oral Daily  . multivitamin with minerals  1 tablet Oral Daily  . piperacillin-tazobactam (ZOSYN)  IV  3.375 g Intravenous Q8H  . senna-docusate  1 tablet Oral QHS  . tamsulosin  0.4 mg Oral Daily  . vancomycin  1,000 mg Intravenous Q12H   Continuous Infusions:  PRN Meds:.ondansetron (ZOFRAN) IV,  oxyCODONE, potassium chloride, traMADol, zolpidem  General appearance: alert, cooperative and no distress Heart: regular rate and rhythm Lungs: dim in right base Abdomen: benign Extremities: no edema Wound: incis healing well  Lab Results: CBC: Recent Labs  06/30/14 0300 07/01/14 0512  WBC 17.1* 16.8*  HGB 9.2* 9.6*  HCT 27.4* 28.8*  PLT 453* 556*   BMET:  Recent Labs  06/29/14 0551 07/01/14 0512  NA 131* 135  K 4.6 4.4  CL 99 100  CO2 28 27  GLUCOSE 122* 111*  BUN 19 19  CREATININE 1.19 1.29  CALCIUM 7.4* 7.8*    Results for orders placed or performed during the hospital encounter of 06/26/14  Blood Culture (routine x 2)     Status: None (Preliminary result)   Collection Time: 06/26/14  5:10 PM  Result Value Ref Range Status   Specimen Description BLOOD RIGHT ARM  Final   Special Requests BOTTLES DRAWN AEROBIC ONLY 2CC  Final   Culture   Final           BLOOD CULTURE RECEIVED NO GROWTH TO DATE CULTURE WILL BE HELD FOR 5 DAYS BEFORE ISSUING A FINAL NEGATIVE REPORT Performed at  [facility]    Report Status PENDING  Incomplete  Blood Culture (routine x 2)     Status: None (Preliminary result)   Collection Time: 06/26/14  5:15 PM  Result Value Ref Range Status   Specimen Description BLOOD LEFT ANTECUBITAL  Final   Special Requests BOTTLES DRAWN AEROBIC AND ANAEROBIC 10CC  Final   Culture   Final           BLOOD CULTURE RECEIVED NO GROWTH TO DATE CULTURE WILL BE HELD FOR 5 DAYS BEFORE ISSUING A FINAL NEGATIVE REPORT Performed at [facility]    Report Status PENDING  Incomplete  MRSA PCR Screening     Status: None   Collection Time: 06/26/14  5:59 PM  Result Value Ref Range Status   MRSA by PCR NEGATIVE NEGATIVE Final    Comment:        The GeneXpert MRSA Assay (FDA approved for NASAL specimens only), is one component of a comprehensive MRSA colonization surveillance program. It is not intended to diagnose MRSA infection nor to guide  or monitor treatment for MRSA infections.   Urine culture     Status: None   Collection Time: 06/26/14  6:45 PM  Result Value Ref Range Status   Specimen Description URINE, CLEAN CATCH  Final   Special Requests Normal  Final   Colony Count NO GROWTH Performed at [facility]   Final   Culture NO GROWTH Performed at [facility]   Final   Report Status 06/28/2014 FINAL  Final  Fungus Culture with Smear     Status: None (Preliminary result)   Collection Time: 06/27/14  8:01 AM  Result Value Ref Range Status   Specimen Description BRONCHIAL WASHINGS RIGHT  Final   Special Requests NONE  Final   Fungal Smear   Final    NO YEAST OR FUNGAL ELEMENTS SEEN Performed at [facility]    Culture   Final    CULTURE IN PROGRESS FOR FOUR WEEKS Performed at [facility]    Report Status PENDING  Incomplete  Gram stain     Status: None   Collection Time: 06/27/14  8:01 AM  Result Value Ref Range Status   Specimen Description BRONCHIAL WASHINGS RIGHT  Final   Special Requests NONE  Final   Gram Stain   Final    FEW WBC PRESENT, PREDOMINANTLY PMN NO ORGANISMS SEEN    Report Status 06/27/2014 FINAL  Final  AFB culture with smear     Status: None (Preliminary result)   Collection Time: 06/27/14  8:01 AM  Result Value Ref Range Status   Specimen Description BRONCHIAL WASHINGS RIGHT  Final   Special Requests NONE  Final   Acid Fast Smear   Final    NO ACID FAST BACILLI SEEN Performed at [facility]    Culture   Final    CULTURE WILL BE EXAMINED FOR 6 WEEKS BEFORE ISSUING A FINAL REPORT Performed at [facility]    Report Status PENDING  Incomplete  Culture, respiratory (NON-Expectorated)     Status: None   Collection Time: 06/27/14  8:01 AM  Result Value Ref Range Status   Specimen Description BRONCHIAL WASHINGS RIGHT  Final   Special Requests NONE  Final   Gram Stain   Final    FEW WBC PRESENT, PREDOMINANTLY PMN NO ORGANISMS  SEEN Performed at [facility] Performed at [facility]   Final  NO GROWTH 2 DAYS Performed at [facility]    Report Status 06/29/2014 FINAL  Final  Fungus Culture with Smear     Status: None (Preliminary result)   Collection Time: 06/27/14  8:44 AM  Result Value Ref Range Status   Specimen Description FLUID PLEURAL RIGHT  Final   Special Requests NONE  Final   Fungal Smear   Final    NO YEAST OR FUNGAL ELEMENTS SEEN Performed at [facility]    Culture   Final    CULTURE IN PROGRESS FOR FOUR WEEKS Performed at [facility]    Report Status PENDING  Incomplete  Gram stain     Status: None   Collection Time: 06/27/14  8:44 AM  Result Value Ref Range Status   Specimen Description FLUID PLEURAL RIGHT  Final   Special Requests NONE  Final   Gram Stain   Final    ABUNDANT WBC PRESENT,BOTH PMN AND MONONUCLEAR NO ORGANISMS SEEN    Report Status 06/27/2014 FINAL  Final  Body fluid culture     Status: None   Collection Time: 06/27/14  8:44 AM  Result Value Ref Range Status   Specimen Description FLUID PLEURAL RIGHT  Final   Special Requests NONE  Final   Gram Stain   Final    ABUNDANT WBC PRESENT, PREDOMINANTLY PMN NO ORGANISMS SEEN Performed at [facility]    Culture   Final    NO GROWTH 3 DAYS Performed at [facility]    Report Status 06/30/2014 FINAL  Final  Anaerobic culture     Status: None (Preliminary result)   Collection Time: 06/27/14  9:08 AM  Result Value Ref Range Status   Specimen Description TISSUE PLEURAL RIGHT  Final   Special Requests NONE  Final   Gram Stain   Final    ABUNDANT WBC PRESENT, PREDOMINANTLY PMN ABUNDANT GRAM POSITIVE COCCI IN PAIRS IN CHAINS Performed at [facility] Performed at [facility]    Culture   Final    NO ANAEROBES ISOLATED; CULTURE IN PROGRESS FOR 5 DAYS Performed at [facility]    Report Status PENDING  Incomplete  Tissue  culture     Status: None   Collection Time: 06/27/14  9:08 AM  Result Value Ref Range Status   Specimen Description TISSUE PLEURAL RIGHT  Final   Special Requests NONE  Final   Gram Stain   Final    ABUNDANT WBC PRESENT, PREDOMINANTLY PMN ABUNDANT GRAM POSITIVE COCCI IN PAIRS IN CHAINS Performed at [facility] Performed at [facility]    Culture   Final    ABUNDANT MICROAEROPHILIC STREPTOCOCCI Note: Standardized susceptibility testing for this organism is not available. Performed at [facility]    Report Status 06/30/2014 FINAL  Final  AFB culture with smear     Status: None (Preliminary result)   Collection Time: 06/27/14  9:08 AM  Result Value Ref Range Status   Specimen Description TISSUE PLEURAL RIGHT  Final   Special Requests NONE  Final   Acid Fast Smear   Final    NO ACID FAST BACILLI SEEN Performed at [facility]    Culture   Final    CULTURE WILL BE EXAMINED FOR 6 WEEKS BEFORE ISSUING A FINAL REPORT Performed at [facility]    Report Status PENDING  Incomplete  Gram stain     Status: None   Collection Time: 06/27/14  9:08  AM  Result Value Ref Range Status   Specimen Description TISSUE PLEURAL RIGHT  Final   Special Requests NONE  Final   Gram Stain   Final    ABUNDANT WBC PRESENT, PREDOMINANTLY PMN ABUNDANT GRAM POSITIVE COCCI IN PAIRS IN CHAINS    Report Status 06/27/2014 FINAL  Final  Anaerobic culture     Status: None (Preliminary result)   Collection Time: 06/27/14  9:14 AM  Result Value Ref Range Status   Specimen Description FLUID PLEURAL RIGHT  Final   Special Requests ON SWAB  Final   Gram Stain PENDING  Incomplete   Culture   Final    NO ANAEROBES ISOLATED; CULTURE IN PROGRESS FOR 5 DAYS Performed at [facility]    Report Status PENDING  Incomplete      PT/INR: No results for input(s): LABPROT, INR in the last 72 hours. Radiology: Dg Chest Port 1 View  06/30/2014   CLINICAL DATA:  Empyema.   EXAM: PORTABLE CHEST - 1 VIEW  COMPARISON:  June 29, 2014.  FINDINGS: Stable cardiomediastinal silhouette. No pneumothorax is noted. Two right-sided chest tubes are noted which are unchanged in position. Third tube has been removed in the interval. Right internal jugular catheter is unchanged with distal tip overlying expected position of SVC. Left lung is clear. Stable mild right basilar pleural effusion is noted an other basilar opacities compared to prior exam.  IMPRESSION: One of 3 right-sided chest tubes has been removed. Two remaining chest tubes are unchanged in position. No pneumothorax is noted. Stable mild right pleural effusion and basilar opacities are noted consistent with atelectasis or inflammation.   Electronically Signed   By: Marijo Conception, M.D.   On: 06/30/2014 08:09   . Assessment/Plan: S/P Procedure(s) (LRB): VIDEO BRONCHOSCOPY (N/A) VIDEO ASSISTED THORACOSCOPY (Right) EMPYEMA DRAINAGE (Right)  1 steady overall progress 2 leukocytosis trend improving/afebrils 3 poss remove another tube soon, 50 ml out this am, no air leak 4 conts current IV abx   GOLD,WAYNE E 07/01/2014 9:02 AM  OR cultures with abundant microaerophlic strep  - cont Zosyn and dc vanco Leave chest drain in another day Transfer to 2  W tele  PAL CXR in am  patient examined and medical record reviewed,agree with above note. Tharon Aquas Trigt III 07/01/2014

## 2014-07-01 NOTE — Progress Notes (Signed)
Utilization review completed.  

## 2014-07-01 NOTE — Progress Notes (Signed)
Transfer report received from 2S at 1128 and pt arrived to the unit via wheelchair with belongings to the side at 1300. VSS, Telemetry applied and verified; Chest tube to water seal to right flank intact; dsg to chest tube clean, dry and intact; no pressure ulcer noted; foam dsg remains intact to sacrum for protection per protocol; pt A&O x4; oriented to the unit and room; pt sitting up in chair with call light within reach. Will continue to monitor quietly. Francis Gaines Alexsis Kathman RN.

## 2014-07-01 NOTE — Progress Notes (Signed)
Pt ambulated 500 ft in hallways; denies any pain, dyspnea; sob or distress. Pt tolerated ambulation well with rolling walker. Back in room sitting up in chair with call light within reach. Reported off to incoming RN. Francis Gaines Priseis Cratty RN.

## 2014-07-02 ENCOUNTER — Inpatient Hospital Stay (HOSPITAL_COMMUNITY): Payer: Medicare Other

## 2014-07-02 LAB — ANAEROBIC CULTURE

## 2014-07-02 LAB — CULTURE, BLOOD (ROUTINE X 2)
Culture: NO GROWTH
Culture: NO GROWTH

## 2014-07-02 NOTE — Progress Notes (Addendum)
       [address]       [place] [address]             [phone]          5 Days Post-Op Procedure(s) (LRB): VIDEO BRONCHOSCOPY (N/A) VIDEO ASSISTED THORACOSCOPY (Right) EMPYEMA DRAINAGE (Right)  Subjective: Feels well, still coughing up a lot of mucus, mostly clear/white. Rested much better last night.  Walking in halls.   Objective: Vital signs in last 24 hours: Patient Vitals for the past 24 hrs:  BP Temp Temp src Pulse Resp SpO2 Weight  07/02/14 0530 134/63 mmHg 98.2 F (36.8 C) Oral 88 18 100 % -  07/02/14 0459 - - - - - - 193 lb 12.8 oz (87.907 kg)  07/01/14 1955 (!) 125/54 mmHg 98.2 F (36.8 C) Oral 89 18 95 % -  07/01/14 1300 123/69 mmHg - - 91 18 96 % -  07/01/14 1219 - 98.4 F (36.9 C) Oral - - - -  07/01/14 1100 (!) 113/57 mmHg - - 89 - 98 % -  07/01/14 0900 (!) 153/112 mmHg - - 96 - 95 % -   Current Weight  07/02/14 193 lb 12.8 oz (87.907 kg)     Intake/Output from previous day: 04/12 0701 - 04/13 0700 In: 740 [P.O.:480; I.V.:10; IV Piggyback:250] Out: 1200 [Urine:1200]    PHYSICAL EXAM:  Heart: RRR Lungs: Decreased BS on right Wound: Clean and dry Chest tube: No air leak    Lab Results: CBC: Recent Labs  06/30/14 0300 07/01/14 0512  WBC 17.1* 16.8*  HGB 9.2* 9.6*  HCT 27.4* 28.8*  PLT 453* 556*   BMET:  Recent Labs  07/01/14 0512  NA 135  K 4.4  CL 100  CO2 27  GLUCOSE 111*  BUN 19  CREATININE 1.29  CALCIUM 7.8*    PT/INR: No results for input(s): LABPROT, INR in the last 72 hours.    Assessment/Plan: S/P Procedure(s) (LRB): VIDEO BRONCHOSCOPY (N/A) VIDEO ASSISTED THORACOSCOPY (Right) EMPYEMA DRAINAGE (Right) No CXR this am, so will order.   CT output low, no air leak.  Possibly can d/c remaining CT today. ID- Microaerophilic strep empyema.  Continue Zosyn.  WBC trending down. Likely switch to Augmentin at d/c. Continue ambulation, pulm toilet.   LOS: 6 days    COLLINS,GINA  H 07/02/2014   Chart reviewed, patient examined, agree with above. Minimal chest tube output and CXR stable. Will remove tube and check CXR in am. Continue mobilization and IS. He will need to continue Augmentin for 10 days at discharge.

## 2014-07-02 NOTE — Progress Notes (Signed)
DC chest tube per MD order and protocol; patient in bed; family at bedrest; call bell within reach; will continue to monitor.  Rowe Pavy, RN

## 2014-07-02 NOTE — Progress Notes (Signed)
Medicare Important Message given? YES  (If response is "NO", the following Medicare IM given date fields will be blank)  Date Medicare IM given: 07/02/14 Medicare IM given by:  Dahlia Client [employer]

## 2014-07-02 NOTE — Discharge Summary (Signed)
[address]       [place] [address]             [phone]              Discharge Summary  Name: [name] [name] DOB: 06/26/1945 69 y.o. MRN: [id number]   Admission Date: 06/26/2014 Discharge Date: 07/04/2014    Admitting Diagnosis: Active Problems:   CAP (community acquired pneumonia)   SIRS (systemic inflammatory response syndrome)   Empyema of right pleural space   Empyema lung    Discharge Diagnosis:  Microaerophilic strep empyema  Past Medical History  Diagnosis Date  . Hypertension   . Urinary frequency   . Low serum testosterone level    Patient Active Problem List   Diagnosis Date Noted  . Empyema of right pleural space 06/26/2014  . Empyema lung 06/26/2014  . SIRS (systemic inflammatory response syndrome) 06/10/2014  . CAP (community acquired pneumonia) 06/07/2014  . Acute respiratory failure with hypoxia 06/07/2014  . Acute renal injury 06/07/2014  . Dehydration 06/07/2014  . Hypertension 06/07/2014  . BPH (benign prostatic hypertrophy) 06/07/2014  . COPD (chronic obstructive pulmonary disease) 06/07/2014      Procedures: VIDEO BRONCHOSCOPY - 06/27/2014 RIGHT VIDEO ASSISTED THORACOSCOPY/MINI-THORACOTOMY  DRAINAGE  OF EMPYEMA   DECORTICATION     HPI:  The patient is a 69 y.o. male was referred to Dr. Servando Snare by Dr. [name] for outpatient evaluation of possible empyema. The patient gives a history of low-grade fever starting March 19, and by March 20 he had developed increasing right sided chest pain and shortness of breath. He was admitted to [facility]. CT scan of the abdomen was done, but not a CT of the chest. He was treated for pneumonia and discharged home on March 22 on po Levaquin. He stopped the Levaquin approximately 10 days ago. Since then, he has become progressively short of breath with right chest discomfort, low-grade fever, and chills. He arranged to see Dr. [name], who ordered a CT scan of the  chest and referred him immediately to thoracic surgery.  CT scan was reviewed by Dr. Servando Snare and revealed a loculated large right pleural effusion and possible empyema. The patient was admitted from the [facility] office for IV antibiotics and surgical drainage of his empyema.    Hospital Course:  The patient was admitted to [facility] on 06/26/2014. He was started empirically on IV Vancomycin and Zosyn.  All risks, benefits and alternatives of surgery were explained in detail, and the patient agreed to proceed.   The patient was taken to the operating room and underwent the above procedure.  Approximately 3 liters of fluid was drained and the lung was decorticated.  The patient tolerated the procedure well and was transferred to the SICU for postoperative management.  The postoperative course has generally been uneventful.  His operative cultures were positive for microaerophilic strep,and his antibiotic coverage was narrowed to Zosyn.  He will continue po Augmentin post-discharge.  White blood count has been trending down.  Chest tube drainage gradually resolved and chest tubes have been removed. Follow up chest x-rays have shown continued improvement with no pneumothorax. The patient is ambulating in the halls without difficulty and is tolerating a regular diet.  Incisions are healing well.   He has been weaned from supplemental oxygen and is maintaining sats of greater than 90% on room air.  Overall, the patient is progressing well and is medically stable on today's date for discharge  home.     Recent vital signs:  Filed Vitals:   07/04/14 0900  BP: 135/63  Pulse: 92  Temp: 97.5 F (36.4 C)  Resp: 18    Recent laboratory studies:  CBC:  Recent Labs  07/04/14 0820  WBC 18.4*  HGB 9.9*  HCT 30.4*  PLT 672*   BMET: No results for input(s): NA, K, CL, CO2, GLUCOSE, BUN, CREATININE, CALCIUM in the last 72 hours.  PT/INR: No results for input(s): LABPROT, INR in the last 72  hours.   Discharge Medications:     Medication List    TAKE these medications        acetaminophen 650 MG CR tablet  Commonly known as:  TYLENOL  Take 650 mg by mouth every 8 (eight) hours as needed for fever.     ACIDOPHILUS PROBIOTIC 10 MG Tabs  Take 1 tablet by mouth daily.     amLODipine 2.5 MG tablet  Commonly known as:  NORVASC  Take 1 tablet by mouth daily.     amoxicillin-clavulanate 875-125 MG per tablet  Commonly known as:  AUGMENTIN  Take 1 tablet by mouth 2 (two) times daily. X 2 weeks     aspirin EC 81 MG tablet  Take 81 mg by mouth daily.     budesonide-formoterol 160-4.5 MCG/ACT inhaler  Commonly known as:  SYMBICORT  Inhale 2 puffs into the lungs 2 (two) times daily.     CALCIUM + D PO  Take 1 tablet by mouth daily.     Fish Oil 1200 MG Caps  Take 1 capsule by mouth daily.     guaiFENesin 600 MG 12 hr tablet  Commonly known as:  MUCINEX  Take 1 tablet (600 mg total) by mouth 2 (two) times daily.     lisinopril 20 MG tablet  Commonly known as:  PRINIVIL,ZESTRIL  Take 1 tablet by mouth daily.     multivitamin with minerals Tabs tablet  Take 1 tablet by mouth daily.     oxyCODONE 5 MG immediate release tablet  Commonly known as:  Oxy IR/ROXICODONE  Take 1-2 tablets (5-10 mg total) by mouth every 4 (four) hours as needed for severe pain.     PA FISH OIL/PHYTOSTEROLS 450-400 MG Caps  Take 1 capsule by mouth daily.     PRESERVISION AREDS 2 PO  Take 2 tablets by mouth daily.     tamsulosin 0.4 MG Caps capsule  Commonly known as:  FLOMAX  Take 1 capsule by mouth daily.     testosterone 50 MG/5GM (1%) Gel  Commonly known as:  ANDROGEL  Place 5 g onto the skin daily.     zolpidem 5 MG tablet  Commonly known as:  AMBIEN  Take 1 tablet (5 mg total) by mouth at bedtime as needed for sleep.         Discharge Instructions:  The patient is to refrain from driving, heavy lifting or strenuous activity.  May shower daily and clean incisions with  soap and water.  May resume regular diet.   Follow Up:    Follow-up Information    Follow up with Grace Isaac, MD In 2 weeks.   Specialty:  Cardiothoracic Surgery   Why:  Office will contact you with an appointment   Contact information:   [address] [place] [address] [phone]       Follow up with [facility] RN In 1 week.   Why:  For suture removal - office  will contact you with an appointment       [place] 07/04/2014, 12:41 PM

## 2014-07-02 NOTE — Progress Notes (Signed)
During shift pt VSS but pt voices weakness since removal or chest tube. Ambulated in hallways 556ft x 3 with walker; pt reported weakness and prn oxycodone adm; pt chest tubes removed site dsg was saturated with serosanguinous drainage; Vaseline gauze remains intact and new gauze dsg applied to site. pt in bed sleeping quietly with call light within reach. Reported off to incoming RN. Francis Gaines Joyce Heitman RN.

## 2014-07-03 ENCOUNTER — Inpatient Hospital Stay (HOSPITAL_COMMUNITY): Payer: Medicare Other

## 2014-07-03 NOTE — Evaluation (Signed)
Physical Therapy Evaluation and Discharge Patient Details Name: [name] [name] MRN: [id number] DOB: 1945/10/27 Today's Date: 07/03/2014   History of Present Illness  Adm 06/26/14 with SOB, chest pain and found to have Rt pleural effusion; 4/8 Rt VATs  Clinical Impression  Patient evaluated by Physical Therapy with no further acute PT needs identified. All education has been completed and the patient has no further questions.  See below for any follow-up Physial Therapy or equipment needs. PT is signing off. Thank you for this referral.     Follow Up Recommendations No PT follow up (? pulmonary rehab if slow recovery at home)    Equipment Recommendations  None recommended by PT    Recommendations for Other Services       Precautions / Restrictions Precautions Precautions: None Restrictions Weight Bearing Restrictions: No      Mobility  Bed Mobility Overal bed mobility: Modified Independent                Transfers Overall transfer level: Independent Equipment used: None                Ambulation/Gait Ambulation/Gait assistance: Independent Ambulation Distance (Feet): 300 Feet Assistive device: None Gait Pattern/deviations: WFL(Within Functional Limits)   Gait velocity interpretation: Below normal speed for age/gender General Gait Details: able to incr velocity with cues  Stairs            Wheelchair Mobility    Modified Rankin (Stroke Patients Only)       Balance Overall balance assessment: Independent                               Standardized Balance Assessment Standardized Balance Assessment : Dynamic Gait Index   Dynamic Gait Index Level Surface: Normal Change in Gait Speed: Normal Gait with Horizontal Head Turns: Mild Impairment Gait with Vertical Head Turns: Mild Impairment Gait and Pivot Turn: Normal Step Around Obstacles: Normal       Pertinent Vitals/Pain SaO2 97% on RA with ambulation (and pt able to talk  throughout)  Pain Assessment: 0-10 Pain Score: 3  Pain Location: Rt chest Pain Intervention(s): Limited activity within patient's tolerance;Monitored during session    Home Living Family/patient expects to be discharged to:: Private residence Living Arrangements: Spouse/significant other Available Help at Discharge: Family;Available 24 hours/day           Home Equipment: Walker - 2 wheels      Prior Function Level of Independence: Independent         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[employer]      Hand Dominance        Extremity/Trunk Assessment   Upper Extremity Assessment: Overall WFL for tasks assessed           Lower Extremity Assessment: Overall WFL for tasks assessed      Cervical / Trunk Assessment: Kyphotic (can reverse with vc; not baseline per wife)  Communication      Cognition                            General Comments General comments (skin integrity, edema, etc.): Discussed possible need for Pulmonary Rehab and to discuss with MD    Exercises        Assessment/Plan    PT Assessment Patent does not need any further PT services  PT Diagnosis Difficulty walking   PT Problem List    PT Treatment Interventions  PT Goals (Current goals can be found in the Care Plan section) Acute Rehab PT Goals PT Goal Formulation: All assessment and education complete, DC therapy    Frequency     Barriers to discharge        Co-evaluation               End of Session Equipment Utilized During Treatment: Gait belt Activity Tolerance: Patient tolerated treatment well Patient left: in chair;with call bell/phone within reach;with family/visitor present           Time: 9432-7614 PT Time Calculation (min) (ACUTE ONLY): 24 min   Charges:   PT Evaluation $Initial PT Evaluation Tier I: 1 Procedure PT Treatments $Gait Training: 8-22 mins   PT G Codes:        Krisanne Lich 07/30/2014, 4:29 PM Pager [phone]

## 2014-07-03 NOTE — Progress Notes (Addendum)
       [address]       [place] [address]             [phone]          6 Days Post-Op Procedure(s) (LRB): VIDEO BRONCHOSCOPY (N/A) VIDEO ASSISTED THORACOSCOPY (Right) EMPYEMA DRAINAGE (Right)  Subjective: Had a rough afternoon/evening yesterday.  "I felt like I did when I came in."  Rested fairly well and feels a little better this am, but still feels weak.   Objective: Vital signs in last 24 hours: Patient Vitals for the past 24 hrs:  BP Temp Temp src Pulse Resp SpO2  07/03/14 0403 (!) 142/64 mmHg 99.7 F (37.6 C) Oral 96 20 93 %  07/02/14 1935 (!) 144/65 mmHg 98 F (36.7 C) Oral 91 20 93 %  07/02/14 1723 (!) 136/58 mmHg 98 F (36.7 C) Axillary 97 20 96 %  07/02/14 1354 (!) 138/59 mmHg 98.5 F (36.9 C) Oral 82 18 98 %  07/02/14 1000 127/61 mmHg 97.9 F (36.6 C) Oral 86 18 97 %   Current Weight  07/02/14 193 lb 12.8 oz (87.907 kg)     Intake/Output from previous day: 04/13 0701 - 04/14 0700 In: 720 [P.O.:720] Out: 1101 [Urine:1100; Stool:1]    PHYSICAL EXAM:  Heart: RRR Lungs: Diminished BS in bases bilaterally Wound: Clean and dry. Previously had serous drainage from CT site, but not actively draining this am.     Lab Results: CBC: Recent Labs  07/01/14 0512  WBC 16.8*  HGB 9.6*  HCT 28.8*  PLT 556*   BMET:  Recent Labs  07/01/14 0512  NA 135  K 4.4  CL 100  CO2 27  GLUCOSE 111*  BUN 19  CREATININE 1.29  CALCIUM 7.8*    PT/INR: No results for input(s): LABPROT, INR in the last 72 hours.  CXR: FINDINGS: Right-sided chest tube has been removed since the previous day's study. No convincing pneumothorax.  Small pleural effusion and right lung irregular interstitial opacities, most evident at the lung base, are stable.  No left pleural effusion. No left lung consolidation. Left lung is hyperexpanded.  Cardiac silhouette is normal in size. No mediastinal or hilar masses or convincing  adenopathy.  IMPRESSION: 1. Status post right chest tube removal since the previous day's study. No convincing pneumothorax. 2. No other change.    Assessment/Plan: S/P Procedure(s) (LRB): VIDEO BRONCHOSCOPY (N/A) VIDEO ASSISTED THORACOSCOPY (Right) EMPYEMA DRAINAGE (Right) CXR stable, needs continued pulm toilet/IS. Strep empyema- D#7 Zosyn.  Will switch to po Augmentin at discharge. Mobilize, continue ambulation. Possibly home later today if feeling better vs in am.   LOS: 7 days    COLLINS,GINA H 07/03/2014   Chart reviewed, patient examined, agree with above. Feels better today. Still coughing up sputum. CXR looks stable. I think he could go home tomorrow if he is feeling well. Will recheck WBC ct in the am. He will go home on Augmentin. He should be seen back in the office in 2 weeks with a CXR.

## 2014-07-03 NOTE — Progress Notes (Signed)
ANTIBIOTIC CONSULT NOTE - FOLLOW UP  69 y/o male on day 8 Zosyn for empyema s/p VATS on 4/8. Culture grew microaerophilic Strep. Renal function is stable. WBC trending down, Tm 99.7. Plan is to change to Augmentin upon discharge.  Continue Zosyn 3.375 g IV q8h to be infused over 4 hours Pharmacy signing off  [facility], Pharm.D., BCPS Clinical Pharmacist Pager: [phone] 07/03/2014 11:23 AM

## 2014-07-04 LAB — CBC
HCT: 30.4 % — ABNORMAL LOW (ref 39.0–52.0)
Hemoglobin: 9.9 g/dL — ABNORMAL LOW (ref 13.0–17.0)
MCH: 30 pg (ref 26.0–34.0)
MCHC: 32.6 g/dL (ref 30.0–36.0)
MCV: 92.1 fL (ref 78.0–100.0)
Platelets: 672 10*3/uL — ABNORMAL HIGH (ref 150–400)
RBC: 3.3 MIL/uL — AB (ref 4.22–5.81)
RDW: 14 % (ref 11.5–15.5)
WBC: 18.4 10*3/uL — ABNORMAL HIGH (ref 4.0–10.5)

## 2014-07-04 MED ORDER — GUAIFENESIN ER 600 MG PO TB12: 600.0000 mg | ORAL_TABLET | Freq: Two times a day (BID) | ORAL | Status: DC

## 2014-07-04 MED ORDER — ZOLPIDEM TARTRATE 5 MG PO TABS: 5.0000 mg | ORAL_TABLET | Freq: Every evening | ORAL | Status: DC | PRN

## 2014-07-04 MED ORDER — AMOXICILLIN-POT CLAVULANATE 875-125 MG PO TABS: 1.0000 | ORAL_TABLET | Freq: Two times a day (BID) | ORAL | Status: DC

## 2014-07-04 MED ORDER — OXYCODONE HCL 5 MG PO TABS: 5.0000 mg | ORAL_TABLET | ORAL | Status: DC | PRN

## 2014-07-04 NOTE — Progress Notes (Signed)
       [address]       [place] [address]             [phone]          7 Days Post-Op Procedure(s) (LRB): VIDEO BRONCHOSCOPY (N/A) VIDEO ASSISTED THORACOSCOPY (Right) EMPYEMA DRAINAGE (Right)  Subjective: Continues with productive cough, clear/white thick sputum. Walked a lot yesterday.   Objective: Vital signs in last 24 hours: Patient Vitals for the past 24 hrs:  BP Temp Temp src Pulse Resp SpO2  07/04/14 0350 129/67 mmHg 98.2 F (36.8 C) Oral 89 18 96 %  07/03/14 2116 135/60 mmHg 98.9 F (37.2 C) Oral 86 18 99 %  07/03/14 1415 (!) 124/52 mmHg 97.5 F (36.4 C) Oral 90 18 97 %   Current Weight  07/02/14 193 lb 12.8 oz (87.907 kg)     Intake/Output from previous day: 04/14 0701 - 04/15 0700 In: 720 [P.O.:720] Out: 650 [Urine:650]    PHYSICAL EXAM:  Heart: RRR Lungs: Better air movement today Wound: Clean and dry    Lab Results: CBC:No results for input(s): WBC, HGB, HCT, PLT in the last 72 hours. BMET: No results for input(s): NA, K, CL, CO2, GLUCOSE, BUN, CREATININE, CALCIUM in the last 72 hours.  PT/INR: No results for input(s): LABPROT, INR in the last 72 hours.    Assessment/Plan: S/P Procedure(s) (LRB): VIDEO BRONCHOSCOPY (N/A) VIDEO ASSISTED THORACOSCOPY (Right) EMPYEMA DRAINAGE (Right) Awaiting CBC to be done to follow up leukocytosis. Better air movement today, no CXR done. Strep empyema- D#8 Zosyn.  Will switch to augmetin at d/c. Home later today if WBC trending down.   LOS: 8 days    Nathan Stallworth H 07/04/2014

## 2014-07-04 NOTE — Progress Notes (Signed)
Utilization review completed.  

## 2014-07-04 NOTE — Care Management Note (Signed)
    Page 1 of 1   07/04/2014     11:53:59 AM CARE MANAGEMENT NOTE 07/04/2014  Patient:  [name] [name],[name] [name]   Account Number:  [id number]  Date Initiated:  07/01/2014  Documentation initiated by:  Marvetta Gibbons  Subjective/Objective Assessment:   Pt admitted s/p VATS and drainage of empyema     Action/Plan:   PTA pt lived at home- NCM to follow for d/c needs   Anticipated DC Date:  07/03/2014   Anticipated DC Plan:  HOME/SELF CARE         Choice offered to / List presented to:             Status of service:  In process, will continue to follow Medicare Important Message given?  YES (If response is "NO", the following Medicare IM given date fields will be blank) Date Medicare IM given:  07/02/2014 Medicare IM given by:  Marvetta Gibbons Date Additional Medicare IM given:   Additional Medicare IM given by:    Discharge Disposition:    Per UR Regulation:  Reviewed for med. necessity/level of care/duration of stay  If discussed at [place] of Stay Meetings, dates discussed:   07/03/2014    Comments:  07/04/14- 1150- Marvetta Gibbons RN, BSN [phone] Spoke with pt and wife yesterday- they are nervous regarding returning home and are asking about HH- PT eval was done and no recommmendations for any HH f/u- wife was asking about HH-RN for home- explained that this was done by MD order- if MD felt that there was a need for a [place] RN to f/u after discharge- MD if agree with [facility]- then please order and this can be arranged- pt doing well ambulating and has DME at home if needed. NCM to follow  07/01/14- 1430- Marvetta Gibbons RN, BSN [phone] Pt tx from 2S today s/p VATS, chest tube still in place- will follow for d/c needs

## 2014-07-07 ENCOUNTER — Other Ambulatory Visit: Payer: Self-pay | Admitting: *Deleted

## 2014-07-07 DIAGNOSIS — J869 Pyothorax without fistula: Secondary | ICD-10-CM

## 2014-07-08 ENCOUNTER — Encounter: Payer: Self-pay | Admitting: Cardiothoracic Surgery

## 2014-07-08 ENCOUNTER — Ambulatory Visit
Admission: RE | Admit: 2014-07-08 | Discharge: 2014-07-08 | Disposition: A | Discharge status: Home / Self Care | Payer: Medicare Other | Source: Ambulatory Visit | Attending: Cardiothoracic Surgery | Admitting: Cardiothoracic Surgery

## 2014-07-08 ENCOUNTER — Other Ambulatory Visit: Payer: Self-pay | Admitting: Cardiothoracic Surgery

## 2014-07-08 ENCOUNTER — Ambulatory Visit (INDEPENDENT_AMBULATORY_CARE_PROVIDER_SITE_OTHER): Payer: Self-pay | Admitting: Cardiothoracic Surgery

## 2014-07-08 VITALS — BP 132/69 | HR 106 | Temp 97.4°F | Resp 18 | Ht 72.0 in | Wt 186.0 lb

## 2014-07-08 DIAGNOSIS — J869 Pyothorax without fistula: Secondary | ICD-10-CM

## 2014-07-08 DIAGNOSIS — R509 Fever, unspecified: Secondary | ICD-10-CM | POA: Diagnosis not present

## 2014-07-08 DIAGNOSIS — J929 Pleural plaque without asbestos: Secondary | ICD-10-CM | POA: Diagnosis not present

## 2014-07-08 DIAGNOSIS — N179 Acute kidney failure, unspecified: Secondary | ICD-10-CM | POA: Diagnosis not present

## 2014-07-08 DIAGNOSIS — I517 Cardiomegaly: Secondary | ICD-10-CM | POA: Diagnosis not present

## 2014-07-08 DIAGNOSIS — J90 Pleural effusion, not elsewhere classified: Secondary | ICD-10-CM | POA: Diagnosis not present

## 2014-07-08 LAB — CBC
HCT: 27.2 % — ABNORMAL LOW (ref 39.0–52.0)
Hemoglobin: 8.8 g/dL — ABNORMAL LOW (ref 13.0–17.0)
MCH: 29.4 pg (ref 26.0–34.0)
MCHC: 32.4 g/dL (ref 30.0–36.0)
MCV: 91 fL (ref 78.0–100.0)
MPV: 8.5 fL — ABNORMAL LOW (ref 8.6–12.4)
Platelets: 818 10*3/uL — ABNORMAL HIGH (ref 150–400)
RBC: 2.99 MIL/uL — ABNORMAL LOW (ref 4.22–5.81)
RDW: 14.2 % (ref 11.5–15.5)
WBC: 25 10*3/uL — ABNORMAL HIGH (ref 4.0–10.5)

## 2014-07-08 LAB — BASIC METABOLIC PANEL
BUN: 20 mg/dL (ref 6–23)
CO2: 25 mEq/L (ref 19–32)
Calcium: 8.2 mg/dL — ABNORMAL LOW (ref 8.4–10.5)
Chloride: 100 mEq/L (ref 96–112)
Creat: 1.1 mg/dL (ref 0.50–1.35)
Glucose, Bld: 93 mg/dL (ref 70–99)
Potassium: 5.4 mEq/L — ABNORMAL HIGH (ref 3.5–5.3)
Sodium: 135 mEq/L (ref 135–145)

## 2014-07-08 NOTE — Progress Notes (Signed)
[address]       [place] [address]             [phone]      Blade Weiler [place] Medical Record #[id number] Date of Birth: 1945/11/28  Referring: Sinda Du, MD Primary Care: Alonza Bogus, MD  Chief Complaint:   POST OP FOLLOW UP 06/27/2014 OPERATIVE REPORT PREOPERATIVE DIAGNOSIS: Postpneumonic empyema with probable sepsis. POSTOPERATIVE DIAGNOSIS: Postpneumonic empyema with probable sepsis. SURGICAL PROCEDURE: Video-assisted bronchoscopy, right video-assisted thoracoscopy, mini thoracotomy, drainage of extensive empyema, and decortication. SURGEON: Lanelle Bal, MD  History of Present Illness:     Patient comes to the office today in follow up after recent drainage of empyema  Culture   Final   ABUNDANT MICROAEROPHILIC STREPTOCOCCI Note: Standardized susceptibility testing for this organism is not available. Performed at [facility]    He still feels weak, he notes some low grade temp 99-100 episodic.      Past Medical History  Diagnosis Date  . Hypertension   . Urinary frequency   . Low serum testosterone level      History  Smoking status  . Current Some Day Smoker  . Types: Pipe  Smokeless tobacco  . Not on file    History  Alcohol Use  . Yes    Comment: occasionally     No Known Allergies  Current Outpatient Prescriptions  Medication Sig Dispense Refill  . acetaminophen (TYLENOL) 650 MG CR tablet Take 650 mg by mouth every 8 (eight) hours as needed for fever.    Marland Kitchen amLODipine (NORVASC) 2.5 MG tablet Take 1 tablet by mouth daily.    Marland Kitchen amoxicillin-clavulanate (AUGMENTIN) 875-125 MG per tablet Take 1 tablet by mouth 2 (two) times daily. X 2 weeks 28 tablet 0  . aspirin EC 81 MG tablet Take 81 mg by mouth daily.    . Calcium Carbonate-Vitamin D (CALCIUM + D PO) Take 1 tablet by mouth daily.    Marland Kitchen guaiFENesin (MUCINEX) 600 MG 12 hr tablet Take 1 tablet (600 mg total) by mouth 2 (two) times  daily.    . Lactobacillus (ACIDOPHILUS PROBIOTIC) 10 MG TABS Take 1 tablet by mouth daily.    Marland Kitchen lisinopril (PRINIVIL,ZESTRIL) 20 MG tablet Take 1 tablet by mouth daily.    . Multiple Vitamins-Minerals (PRESERVISION AREDS 2 PO) Take 2 tablets by mouth daily.    . Omega-3 Fatty Acids (FISH OIL) 1200 MG CAPS Take 1 capsule by mouth daily.    . tamsulosin (FLOMAX) 0.4 MG CAPS capsule Take 1 capsule by mouth daily.    Marland Kitchen zolpidem (AMBIEN) 5 MG tablet Take 1 tablet (5 mg total) by mouth at bedtime as needed for sleep. 30 tablet 0  . oxyCODONE (OXY IR/ROXICODONE) 5 MG immediate release tablet Take 1-2 tablets (5-10 mg total) by mouth every 4 (four) hours as needed for severe pain. (Patient not taking: Reported on 07/08/2014) 30 tablet 0  . testosterone (ANDROGEL) 50 MG/5GM (1%) GEL Place 5 g onto the skin daily.     No current facility-administered medications for this visit.       Physical Exam: BP 132/69 mmHg  Pulse 106  Temp(Src) 97.4 F (36.3 C) (Oral)  Resp 18  Ht 6' (1.829 m)  Wt 186 lb (84.369 kg)  BMI 25.22 kg/m2  SpO2 96%  General appearance: alert, cooperative, fatigued and no distress Neurologic: intact Heart: regular rate and rhythm, S1, S2 normal, no murmur, click, rub or gallop Lungs:  diminished breath sounds LLL Abdomen: soft, non-tender; bowel sounds normal; no masses,  no organomegaly Extremities: extremities normal, atraumatic, no cyanosis or edema and Homans sign is negative, no sign of DVT Wound: wounds and chest ube tract well healed   Diagnostic Studies & Laboratory data:     Recent Radiology Findings:   Dg Chest 2 View  07/08/2014   CLINICAL DATA:  Thoracotomy.  Fever.  EXAM: CHEST  2 VIEW  COMPARISON:  07/03/2014.  07/02/2014.  06/29/2014 .  FINDINGS: Mediastinum and hilar structures are normal. Heart size stable. Left lung is clear. Right pleural thickening/effusion is present. No acute bony abnormality. Degenerative changes thoracic spine.  IMPRESSION: 1.  Stable right pleural thickening/effusion since chest tube removal. No progressive pleural effusion. No pneumothorax. 2. Stable cardiomegaly.   Electronically Signed   By: Marcello Moores  Register   On: 07/08/2014 14:28      Recent Lab Findings: Lab Results  Component Value Date   WBC 25.0* 07/08/2014   HGB 8.8* 07/08/2014   HCT 27.2* 07/08/2014   PLT 818* 07/08/2014   GLUCOSE 93 07/08/2014   ALT 60* 06/29/2014   AST 47* 06/29/2014   NA 135 07/08/2014   K 5.4* 07/08/2014   CL 100 07/08/2014   CREATININE 1.10 07/08/2014   BUN 20 07/08/2014   CO2 25 07/08/2014   INR 1.17 06/26/2014   Recent Results (from the past 720 hour(s))  Culture, sputum-assessment     Status: None   Collection Time: 06/10/14  7:14 AM  Result Value Ref Range Status   Specimen Description SPUTUM EXPECTORATED  Final   Special Requests NONE  Final   Sputum evaluation   Final    THIS SPECIMEN IS ACCEPTABLE. RESPIRATORY CULTURE REPORT TO FOLLOW. Performed at [facility]    Report Status 06/10/2014 FINAL  Final  Culture, respiratory (NON-Expectorated)     Status: None   Collection Time: 06/10/14  7:14 AM  Result Value Ref Range Status   Specimen Description SPUTUM EXPECTORATED  Final   Special Requests NONE  Final   Gram Stain   Final    MODERATE WBC PRESENT, PREDOMINANTLY PMN MODERATE SQUAMOUS EPITHELIAL CELLS PRESENT ABUNDANT GRAM POSITIVE COCCI IN PAIRS IN CHAINS FEW GRAM NEGATIVE RODS FEW GRAM POSITIVE RODS    Culture   Final    NORMAL OROPHARYNGEAL FLORA Performed at [facility]    Report Status 06/13/2014 FINAL  Final  Blood Culture (routine x 2)     Status: None   Collection Time: 06/26/14  5:10 PM  Result Value Ref Range Status   Specimen Description BLOOD RIGHT ARM  Final   Special Requests BOTTLES DRAWN AEROBIC ONLY 2CC  Final   Culture   Final    NO GROWTH 5 DAYS Performed at [facility]    Report Status 07/02/2014 FINAL  Final  Blood Culture (routine x 2)      Status: None   Collection Time: 06/26/14  5:15 PM  Result Value Ref Range Status   Specimen Description BLOOD LEFT ANTECUBITAL  Final   Special Requests BOTTLES DRAWN AEROBIC AND ANAEROBIC 10CC  Final   Culture   Final    NO GROWTH 5 DAYS Performed at [facility]    Report Status 07/02/2014 FINAL  Final  MRSA PCR Screening     Status: None   Collection Time: 06/26/14  5:59 PM  Result Value Ref Range Status   MRSA by PCR NEGATIVE NEGATIVE Final    Comment:  The GeneXpert MRSA Assay (FDA approved for NASAL specimens only), is one component of a comprehensive MRSA colonization surveillance program. It is not intended to diagnose MRSA infection nor to guide or monitor treatment for MRSA infections.   Urine culture     Status: None   Collection Time: 06/26/14  6:45 PM  Result Value Ref Range Status   Specimen Description URINE, CLEAN CATCH  Final   Special Requests Normal  Final   Colony Count NO GROWTH Performed at [facility]   Final   Culture NO GROWTH Performed at [facility]   Final   Report Status 06/28/2014 FINAL  Final  Fungus Culture with Smear     Status: None (Preliminary result)   Collection Time: 06/27/14  8:01 AM  Result Value Ref Range Status   Specimen Description BRONCHIAL WASHINGS RIGHT  Final   Special Requests NONE  Final   Fungal Smear   Final    NO YEAST OR FUNGAL ELEMENTS SEEN Performed at [facility]    Culture   Final    CULTURE IN PROGRESS FOR FOUR WEEKS Performed at [facility]    Report Status PENDING  Incomplete  Gram stain     Status: None   Collection Time: 06/27/14  8:01 AM  Result Value Ref Range Status   Specimen Description BRONCHIAL WASHINGS RIGHT  Final   Special Requests NONE  Final   Gram Stain   Final    FEW WBC PRESENT, PREDOMINANTLY PMN NO ORGANISMS SEEN    Report Status 06/27/2014 FINAL  Final  AFB culture with smear     Status: None (Preliminary result)   Collection  Time: 06/27/14  8:01 AM  Result Value Ref Range Status   Specimen Description BRONCHIAL WASHINGS RIGHT  Final   Special Requests NONE  Final   Acid Fast Smear   Final    NO ACID FAST BACILLI SEEN Performed at [facility]    Culture   Final    CULTURE WILL BE EXAMINED FOR 6 WEEKS BEFORE ISSUING A FINAL REPORT Performed at [facility]    Report Status PENDING  Incomplete  Culture, respiratory (NON-Expectorated)     Status: None   Collection Time: 06/27/14  8:01 AM  Result Value Ref Range Status   Specimen Description BRONCHIAL WASHINGS RIGHT  Final   Special Requests NONE  Final   Gram Stain   Final    FEW WBC PRESENT, PREDOMINANTLY PMN NO ORGANISMS SEEN Performed at [facility] Performed at [facility]    Culture   Final    NO GROWTH 2 DAYS Performed at [facility]    Report Status 06/29/2014 FINAL  Final  Fungus Culture with Smear     Status: None (Preliminary result)   Collection Time: 06/27/14  8:44 AM  Result Value Ref Range Status   Specimen Description FLUID PLEURAL RIGHT  Final   Special Requests NONE  Final   Fungal Smear   Final    NO YEAST OR FUNGAL ELEMENTS SEEN Performed at [facility]    Culture   Final    CULTURE IN PROGRESS FOR FOUR WEEKS Performed at [facility]    Report Status PENDING  Incomplete  Gram stain     Status: None   Collection Time: 06/27/14  8:44 AM  Result Value Ref Range Status   Specimen Description FLUID PLEURAL RIGHT  Final   Special Requests NONE  Final  Gram Stain   Final    ABUNDANT WBC PRESENT,BOTH PMN AND MONONUCLEAR NO ORGANISMS SEEN    Report Status 06/27/2014 FINAL  Final  Body fluid culture     Status: None   Collection Time: 06/27/14  8:44 AM  Result Value Ref Range Status   Specimen Description FLUID PLEURAL RIGHT  Final   Special Requests NONE  Final   Gram Stain   Final    ABUNDANT WBC PRESENT, PREDOMINANTLY PMN NO ORGANISMS SEEN Performed at  [facility]    Culture   Final    NO GROWTH 3 DAYS Performed at [facility]    Report Status 06/30/2014 FINAL  Final  Anaerobic culture     Status: None   Collection Time: 06/27/14  9:08 AM  Result Value Ref Range Status   Specimen Description TISSUE PLEURAL RIGHT  Final   Special Requests NONE  Final   Gram Stain   Final    ABUNDANT WBC PRESENT, PREDOMINANTLY PMN ABUNDANT GRAM POSITIVE COCCI IN PAIRS IN CHAINS Performed at [facility] Performed at [facility]    Culture   Final    NO ANAEROBES ISOLATED Performed at [facility]    Report Status 07/02/2014 FINAL  Final  Tissue culture     Status: None   Collection Time: 06/27/14  9:08 AM  Result Value Ref Range Status   Specimen Description TISSUE PLEURAL RIGHT  Final   Special Requests NONE  Final   Gram Stain   Final    ABUNDANT WBC PRESENT, PREDOMINANTLY PMN ABUNDANT GRAM POSITIVE COCCI IN PAIRS IN CHAINS Performed at [facility] Performed at [facility]    Culture   Final    ABUNDANT MICROAEROPHILIC STREPTOCOCCI Note: Standardized susceptibility testing for this organism is not available. Performed at [facility]    Report Status 06/30/2014 FINAL  Final  AFB culture with smear     Status: None (Preliminary result)   Collection Time: 06/27/14  9:08 AM  Result Value Ref Range Status   Specimen Description TISSUE PLEURAL RIGHT  Final   Special Requests NONE  Final   Acid Fast Smear   Final    NO ACID FAST BACILLI SEEN Performed at [facility]    Culture   Final    CULTURE WILL BE EXAMINED FOR 6 WEEKS BEFORE ISSUING A FINAL REPORT Performed at [facility]    Report Status PENDING  Incomplete  Gram stain     Status: None   Collection Time: 06/27/14  9:08 AM  Result Value Ref Range Status   Specimen Description TISSUE PLEURAL RIGHT  Final   Special Requests NONE  Final   Gram Stain   Final    ABUNDANT WBC PRESENT,  PREDOMINANTLY PMN ABUNDANT GRAM POSITIVE COCCI IN PAIRS IN CHAINS    Report Status 06/27/2014 FINAL  Final  Anaerobic culture     Status: None   Collection Time: 06/27/14  9:14 AM  Result Value Ref Range Status   Specimen Description FLUID PLEURAL RIGHT  Final   Special Requests ON SWAB  Final   Gram Stain   Final    ABUNDANT WBC PRESENT, PREDOMINANTLY PMN ABUNDANT GRAM POSITIVE COCCI IN PAIRS IN CHAINS Performed at [facility]    Culture   Final    NO ANAEROBES ISOLATED Performed at [facility]    Report Status 07/02/2014 FINAL  Final     Assessment / Plan:  Follow up after drainage of left empyema, WBC still elevated,   Chest xray stable without obvious increasing effusion. Caution patient  about taking took much tylenol, continue antibiotic therapy for  Culture   Final   ABUNDANT MICROAEROPHILIC STREPTOCOCCI Note: Standardized susceptibility testing for this organism is not available. Performed at [facility]    Follow CBC  And chest xray early next week.       Grace Isaac MD      [address] [place] [address] Office [phone]   Beeper [phone]  07/09/2014 12:29 PM

## 2014-07-09 ENCOUNTER — Inpatient Hospital Stay (HOSPITAL_COMMUNITY)
Admission: AD | Admit: 2014-07-09 | Discharge: 2014-07-15 | DRG: 179 | Disposition: A | Discharge status: Home Health | Payer: Medicare Other | Source: Ambulatory Visit | Attending: Cardiothoracic Surgery | Admitting: Cardiothoracic Surgery

## 2014-07-09 ENCOUNTER — Inpatient Hospital Stay (HOSPITAL_COMMUNITY): Payer: Medicare Other

## 2014-07-09 DIAGNOSIS — Z8701 Personal history of pneumonia (recurrent): Secondary | ICD-10-CM | POA: Diagnosis not present

## 2014-07-09 DIAGNOSIS — D649 Anemia, unspecified: Secondary | ICD-10-CM | POA: Diagnosis present

## 2014-07-09 DIAGNOSIS — R509 Fever, unspecified: Secondary | ICD-10-CM | POA: Diagnosis not present

## 2014-07-09 DIAGNOSIS — J869 Pyothorax without fistula: Principal | ICD-10-CM | POA: Insufficient documentation

## 2014-07-09 DIAGNOSIS — F509 Eating disorder, unspecified: Secondary | ICD-10-CM | POA: Diagnosis not present

## 2014-07-09 DIAGNOSIS — Z720 Tobacco use: Secondary | ICD-10-CM | POA: Diagnosis not present

## 2014-07-09 DIAGNOSIS — I10 Essential (primary) hypertension: Secondary | ICD-10-CM | POA: Diagnosis present

## 2014-07-09 DIAGNOSIS — D72829 Elevated white blood cell count, unspecified: Secondary | ICD-10-CM

## 2014-07-09 DIAGNOSIS — J439 Emphysema, unspecified: Secondary | ICD-10-CM | POA: Diagnosis not present

## 2014-07-09 DIAGNOSIS — J189 Pneumonia, unspecified organism: Secondary | ICD-10-CM

## 2014-07-09 DIAGNOSIS — R0609 Other forms of dyspnea: Secondary | ICD-10-CM | POA: Diagnosis not present

## 2014-07-09 DIAGNOSIS — Z452 Encounter for adjustment and management of vascular access device: Secondary | ICD-10-CM | POA: Diagnosis not present

## 2014-07-09 DIAGNOSIS — B954 Other streptococcus as the cause of diseases classified elsewhere: Secondary | ICD-10-CM

## 2014-07-09 DIAGNOSIS — Z5181 Encounter for therapeutic drug level monitoring: Secondary | ICD-10-CM | POA: Diagnosis not present

## 2014-07-09 DIAGNOSIS — I340 Nonrheumatic mitral (valve) insufficiency: Secondary | ICD-10-CM | POA: Diagnosis not present

## 2014-07-09 HISTORY — DX: Reserved for inherently not codable concepts without codable children: IMO0001

## 2014-07-09 HISTORY — DX: Pneumonia, unspecified organism: J18.9

## 2014-07-09 LAB — CBC
HCT: 24.8 % — ABNORMAL LOW (ref 39.0–52.0)
Hemoglobin: 8 g/dL — ABNORMAL LOW (ref 13.0–17.0)
MCH: 29.3 pg (ref 26.0–34.0)
MCHC: 32.3 g/dL (ref 30.0–36.0)
MCV: 90.8 fL (ref 78.0–100.0)
PLATELETS: 746 10*3/uL — AB (ref 150–400)
RBC: 2.73 MIL/uL — ABNORMAL LOW (ref 4.22–5.81)
RDW: 14.6 % (ref 11.5–15.5)
WBC: 20.5 10*3/uL — ABNORMAL HIGH (ref 4.0–10.5)

## 2014-07-09 LAB — CREATININE, SERUM
CREATININE: 1.29 mg/dL (ref 0.50–1.35)
GFR calc non Af Amer: 55 mL/min — ABNORMAL LOW (ref >90)
GFR, EST AFRICAN AMERICAN: 64 mL/min — AB (ref >90)

## 2014-07-09 MED ORDER — OMEGA-3-ACID ETHYL ESTERS 1 G PO CAPS
1.0000 g | ORAL_CAPSULE | Freq: Every day | ORAL | Status: DC
  Administered 2014-07-10 – 2014-07-15 (×6): 1 g via ORAL
  Filled 2014-07-09 (×6): qty 1

## 2014-07-09 MED ORDER — SODIUM CHLORIDE 0.9 % IJ SOLN: 3.0000 mL | INTRAMUSCULAR | Status: DC | PRN

## 2014-07-09 MED ORDER — OXYCODONE HCL 5 MG PO TABS
5.0000 mg | ORAL_TABLET | ORAL | Status: DC | PRN
  Filled 2014-07-09: qty 1

## 2014-07-09 MED ORDER — ZOLPIDEM TARTRATE 5 MG PO TABS
5.0000 mg | ORAL_TABLET | Freq: Every evening | ORAL | Status: DC | PRN
  Administered 2014-07-09: 5 mg via ORAL
  Administered 2014-07-10: 2.5 mg via ORAL
  Filled 2014-07-09 (×4): qty 1

## 2014-07-09 MED ORDER — SODIUM CHLORIDE 0.9 % IJ SOLN
3.0000 mL | Freq: Two times a day (BID) | INTRAMUSCULAR | Status: DC
  Administered 2014-07-09 – 2014-07-14 (×10): 3 mL via INTRAVENOUS

## 2014-07-09 MED ORDER — BISACODYL 10 MG RE SUPP: 10.0000 mg | Freq: Every day | RECTAL | Status: DC | PRN

## 2014-07-09 MED ORDER — OMEGA-3-ACID ETHYL ESTERS 1 G PO CAPS
1.0000 g | ORAL_CAPSULE | Freq: Every day | ORAL | Status: DC
  Filled 2014-07-09: qty 1

## 2014-07-09 MED ORDER — AMLODIPINE BESYLATE 2.5 MG PO TABS
2.5000 mg | ORAL_TABLET | Freq: Every day | ORAL | Status: DC
  Administered 2014-07-10: 2.5 mg via ORAL
  Filled 2014-07-09 (×6): qty 1

## 2014-07-09 MED ORDER — ACETAMINOPHEN 325 MG PO TABS: 650.0000 mg | ORAL_TABLET | Freq: Four times a day (QID) | ORAL | Status: DC | PRN

## 2014-07-09 MED ORDER — ENOXAPARIN SODIUM 40 MG/0.4ML ~~LOC~~ SOLN
40.0000 mg | SUBCUTANEOUS | Status: DC
  Administered 2014-07-09 – 2014-07-14 (×6): 40 mg via SUBCUTANEOUS
  Filled 2014-07-09 (×7): qty 0.4

## 2014-07-09 MED ORDER — GUAIFENESIN ER 600 MG PO TB12
600.0000 mg | ORAL_TABLET | Freq: Two times a day (BID) | ORAL | Status: DC
  Administered 2014-07-09 – 2014-07-15 (×12): 600 mg via ORAL
  Filled 2014-07-09 (×13): qty 1

## 2014-07-09 MED ORDER — MAGNESIUM HYDROXIDE 400 MG/5ML PO SUSP: 30.0000 mL | Freq: Every day | ORAL | Status: DC | PRN

## 2014-07-09 MED ORDER — CEFEPIME HCL 1 G IJ SOLR
1.0000 g | Freq: Three times a day (TID) | INTRAMUSCULAR | Status: DC
  Administered 2014-07-09 – 2014-07-15 (×19): 1 g via INTRAVENOUS
  Filled 2014-07-09 (×23): qty 1

## 2014-07-09 MED ORDER — ONDANSETRON HCL 4 MG/2ML IJ SOLN: 4.0000 mg | Freq: Four times a day (QID) | INTRAMUSCULAR | Status: DC | PRN

## 2014-07-09 MED ORDER — ASPIRIN EC 81 MG PO TBEC
81.0000 mg | DELAYED_RELEASE_TABLET | Freq: Every day | ORAL | Status: DC
  Administered 2014-07-10 – 2014-07-15 (×6): 81 mg via ORAL
  Filled 2014-07-09 (×6): qty 1

## 2014-07-09 MED ORDER — ACETAMINOPHEN 650 MG RE SUPP: 650.0000 mg | Freq: Four times a day (QID) | RECTAL | Status: DC | PRN

## 2014-07-09 MED ORDER — LISINOPRIL 20 MG PO TABS
20.0000 mg | ORAL_TABLET | Freq: Every day | ORAL | Status: DC
  Administered 2014-07-10: 20 mg via ORAL
  Filled 2014-07-09 (×6): qty 1

## 2014-07-09 MED ORDER — TAMSULOSIN HCL 0.4 MG PO CAPS
0.4000 mg | ORAL_CAPSULE | Freq: Every day | ORAL | Status: DC
  Administered 2014-07-10 – 2014-07-15 (×6): 0.4 mg via ORAL
  Filled 2014-07-09 (×6): qty 1

## 2014-07-09 MED ORDER — ONDANSETRON HCL 4 MG PO TABS
4.0000 mg | ORAL_TABLET | Freq: Four times a day (QID) | ORAL | Status: DC | PRN
Start: 2014-07-09 — End: 2014-07-15

## 2014-07-09 MED ORDER — SODIUM CHLORIDE 0.9 % IV SOLN
250.0000 mL | INTRAVENOUS | Status: DC | PRN
Start: 2014-07-09 — End: 2014-07-15
  Administered 2014-07-09: 250 mL via INTRAVENOUS

## 2014-07-09 MED ORDER — ALUM & MAG HYDROXIDE-SIMETH 200-200-20 MG/5ML PO SUSP: 30.0000 mL | Freq: Four times a day (QID) | ORAL | Status: DC | PRN

## 2014-07-09 NOTE — Consult Note (Signed)
    [facility]     Reason for Consult:fever, leukocytosis    Referring Physician: Dr. Servando Snare  Active Problems:   Fever   . enoxaparin (LOVENOX) injection  40 mg Subcutaneous Q24H  . guaiFENesin  600 mg Oral BID  . sodium chloride  3 mL Intravenous Q12H    Recommendations: Cefepime Blood cultures CT chest Echo to evaluate for possible endocarditis   Assessment: He has persistent fever and WBC up to 25,000.   Previous WBC at discharge was 18,000.  Only positive cultures was microaerophilic Strep from tissue culture.  He has continued on Augmentin.  I suspect it is more of an issue of source, either lungs or possible endocarditis.  Could be new, nosocomial infection but no new symptoms to pinpoint.    Antibiotics: augmentin since discharge  HPI: [name] [name] is a 69 y.o. male with no significant phm who [facility] with right chest pain and CXR c/w pneumonia.  Admitted and treated with antibiotics but symptoms persisted, CT scan done and noted empyema.  Sent to Dr. Servando Snare for admission and surgical intervention and underwent drainage and decortication of empyema.  Tissue grew microaerophilic Strep.  Treated with zosyn and then sent out on Augmentin.  After discharge, he continued to have fever between 100-101, night sweats.  No diarrhea, no significant sob.     Review of Systems: A comprehensive review of systems was negative.  Past Medical History  Diagnosis Date  . Hypertension   . Urinary frequency   . Low serum testosterone level     History  Substance Use Topics  . Smoking status: Current Some Day Smoker    Types: Pipe  . Smokeless tobacco: Not on file  . Alcohol Use: Yes     Comment: occasionally    Family History  Problem Relation Age of Onset  . Tuberculosis Father   . Hypertension Father   . COPD Mother   . Hypertension Mother    No Known Allergies  OBJECTIVE: Blood pressure 121/60, pulse 94,  temperature 98.2 F (36.8 C), temperature source Oral, resp. rate 18, SpO2 98 %. General: awake, alert, nad Skin: no rashes Lungs: CTA B Cor: RRR without m Abdomen: soft, nt, nd Ext: no edema Skin: chest wounds clean, dry, minimal surrounding erythema, no discharge, not open, minimal tenderness  Microbiology: No results found for this or any previous visit (from the past 240 hour(s)).  Scharlene Gloss, [facility] [place] [email or web address] [phone] pager  [phone] 07/09/2014, 4:22 PM

## 2014-07-09 NOTE — H&P (Signed)
[address]       [place] [address]             [phone]             HISTORY AND PHYSICAL     Name: [name] [name] DOB: Mar 29, 1945 69 y.o. MRN: [id number]    Chief Complaint: Fever, s/p left VATS/drainage of empyema   HPI: [name] [name] is a 69 y.o. male who is known to [facility] from a recent admission (4/7-15/2016) for empyema. He underwent a right VATS/decortication by Dr. Servando Snare on 06/27/2014.  Cultures were positive for microaerophilic strep. He was treated with a postop course of IV Zosyn and was switched to po Augmentin at discharge. His white blood cell count was trending down at the time of discharge.  Since he went home, he reports continued weakness and malaise.  He has been taking scheduled Tylenol 650 mg q8 hrs and his temperature has been hovering between 99-100, but once he stopped the Tylenol, his fever spiked to 101. He has been mildly dyspneic with activity, but overall feels this is stable since surgery.  He also continues with a productive cough, mostly of thick, clear sputum. He denies chest pain, dysuria, purulent sputum, headache or chills.  Appetite has been improving and he denies diarrhea, nausea or vomiting. He was seen in the office yesterday for follow up with Dr. Servando Snare and labs revealed a leukocytosis up to 25,000 from 18,000 at discharge.  Because of his continued fevers and leukocytosis, he is now being admitted for further evaluation.    Past Medical History:  Past Medical History  Diagnosis Date  . Hypertension   . Urinary frequency   . Low serum testosterone level     Past Surgical History: Past Surgical History  Procedure Laterality Date  . Hernia repair    . Appendectomy    . Video bronchoscopy N/A 06/27/2014    Procedure: VIDEO BRONCHOSCOPY;  Surgeon: Grace Isaac, MD;  Location: [facility];  Service: Thoracic;  Laterality: N/A;  . Video assisted thoracoscopy Right 06/27/2014    Procedure: VIDEO ASSISTED  THORACOSCOPY;  Surgeon: Grace Isaac, MD;  Location: [facility];  Service: Thoracic;  Laterality: Right;  . Empyema drainage Right 06/27/2014    Procedure: EMPYEMA DRAINAGE;  Surgeon: Grace Isaac, MD;  Location: [facility];  Service: Thoracic;  Laterality: Right;    Social History: History   Social History  . Marital Status: Married    Spouse Name: N/A  . Number of Children: N/A  . Years of Education: N/A   Social History Main Topics  . Smoking status: Current Some Day Smoker    Types: Pipe  . Smokeless tobacco: Not on file  . Alcohol Use: Yes     Comment: occasionally  . Drug Use: No  . Sexual Activity: Not on file   Other Topics Concern  . Not on file   Social History Narrative    Family History: Family History  Problem Relation Age of Onset  . Tuberculosis Father   . Hypertension Father   . COPD Mother   . Hypertension Mother     Allergies: No Known Allergies   Medications: Prior to Admission medications   Medication Sig Start Date End Date Taking? Authorizing Provider  acetaminophen (TYLENOL) 650 MG CR tablet Take 650 mg by mouth every 8 (eight) hours as needed for fever.    Historical Provider, MD  amLODipine (NORVASC) 2.5 MG tablet Take  1 tablet by mouth daily. 05/20/14   Historical Provider, MD  amoxicillin-clavulanate (AUGMENTIN) 875-125 MG per tablet Take 1 tablet by mouth 2 (two) times daily. X 2 weeks 07/04/14   Coolidge Breeze, PA-C  aspirin EC 81 MG tablet Take 81 mg by mouth daily.    Historical Provider, MD  Calcium Carbonate-Vitamin D (CALCIUM + D PO) Take 1 tablet by mouth daily.    Historical Provider, MD  guaiFENesin (MUCINEX) 600 MG 12 hr tablet Take 1 tablet (600 mg total) by mouth 2 (two) times daily. 07/04/14   Arlyn Leak Gearline Spilman, PA-C  Lactobacillus (ACIDOPHILUS PROBIOTIC) 10 MG TABS Take 1 tablet by mouth daily.    Historical Provider, MD  lisinopril (PRINIVIL,ZESTRIL) 20 MG tablet Take 1 tablet by mouth daily. 05/20/14   Historical Provider, MD    Multiple Vitamins-Minerals (PRESERVISION AREDS 2 PO) Take 2 tablets by mouth daily.    Historical Provider, MD  Omega-3 Fatty Acids (FISH OIL) 1200 MG CAPS Take 1 capsule by mouth daily.    Historical Provider, MD  oxyCODONE (OXY IR/ROXICODONE) 5 MG immediate release tablet Take 1-2 tablets (5-10 mg total) by mouth every 4 (four) hours as needed for severe pain. Patient not taking: Reported on 07/08/2014 07/04/14   Coolidge Breeze, PA-C  tamsulosin (FLOMAX) 0.4 MG CAPS capsule Take 1 capsule by mouth daily. 05/30/14   Historical Provider, MD  testosterone (ANDROGEL) 50 MG/5GM (1%) GEL Place 5 g onto the skin daily. 05/21/14   Historical Provider, MD  zolpidem (AMBIEN) 5 MG tablet Take 1 tablet (5 mg total) by mouth at bedtime as needed for sleep. 07/04/14   Coolidge Breeze, PA-C     Review of Systems:              Cardiac:   Chest Pain [ ]   Resting SOB [   ] Exertional SOB  [ y ]  Orthopnea [  ]   Pedal Edema [   ]    Palpitations [  ] Syncope  [  ]   Presyncope [   ]  General: Constitional: recent weight change [  ]; anorexia [  ]; fatigue [ y ]; nausea [  ]; night sweats [ ] ; fever [ y ]; or chills [  ];                                                                                                                                          Dental: poor dentition[  ];   Eye : blurred vision [  ]; diplopia [   ]; vision changes [  ];  Amaurosis fugax[  ]; Resp: cough [ y ];  wheezing[  ];  hemoptysis[  ]; shortness of breath[ y ]; paroxysmal nocturnal dyspnea[  ]; dyspnea on exertion[ y ]; or orthopnea[  ];  GI:  gallstones[  ], vomiting[  ];  dysphagia[  ]; melena[  ];  hematochezia [  ]; heartburn[  ];   Hx of  Colonoscopy[  ]; GU: kidney stones [  ]; hematuria[  ];   dysuria [  ];  nocturia[  ];  history of     obstruction [  ];             Skin: rash, swelling[  ];, hair loss[  ];  peripheral edema[  ];  or itching[  ]; Musculosketetal: myalgias[  ];  joint swelling[  ];  joint erythema[  ];   joint pain[  ];  back pain[  ];  Heme/Lymph: bruising[  ];  bleeding[  ];  anemia[  ];  Neuro: TIA[  ];  headaches[  ];  stroke[  ];  vertigo[  ];  seizures[  ];   paresthesias[  ];  difficulty walking[  ];  Psych:depression[  ]; anxiety[  ];  Endocrine: diabetes[  ];  thyroid dysfunction[  ];  Immunizations: Flu [  ]; Pneumococcal[  ];     Physical Exam: Filed Vitals:   07/09/14 1556  BP: 121/60  Pulse: 94  Temp: 98.2 F (36.8 C)  Resp: 18    General: No acute distress Heart: Regular rate and rhythm without murmurs, rubs, or gallops Lungs: Breath sounds diminished in right base, no wheezes, rales or rhonchi Abdomen: Soft, nontender, nondistended.  Bowel sounds active in all quadrants. Neurologic: Grossly intact.  No obvious deficits. Extremities: No lower extremity edema. Pedal pulses intact.   Wounds: Thoracotomy incisions and chest tube sites healing well without erythema or drainage. Old IV sites examined and no erythema or drainage noted.   Labs: Lab Results  Component Value Date   WBC 25.0* 07/08/2014   HGB 8.8* 07/08/2014   HCT 27.2* 07/08/2014   MCV 91.0 07/08/2014   PLT 818* 07/08/2014   Lab Results  Component Value Date   INR 1.17 06/26/2014      Component Value Date/Time   NA 135 07/08/2014 1534   K 5.4* 07/08/2014 1534   CL 100 07/08/2014 1534   CO2 25 07/08/2014 1534   GLUCOSE 93 07/08/2014 1534   BUN 20 07/08/2014 1534   CREATININE 1.10 07/08/2014 1534   CREATININE 1.29 07/01/2014 0512   CALCIUM 8.2* 07/08/2014 1534   GFRNONAA 55* 07/01/2014 0512   GFRAA 64* 07/01/2014 0512    Imaging: Dg Chest 2 View  07/08/2014   CLINICAL DATA:  Thoracotomy.  Fever.  EXAM: CHEST  2 VIEW  COMPARISON:  07/03/2014.  07/02/2014.  06/29/2014 .  FINDINGS: Mediastinum and hilar structures are normal. Heart size stable. Left lung is clear. Right pleural thickening/effusion is present. No acute bony abnormality. Degenerative changes thoracic spine.  IMPRESSION: 1.  Stable right pleural thickening/effusion since chest tube removal. No progressive pleural effusion. No pneumothorax. 2. Stable cardiomegaly.   Electronically Signed   By: Marcello Moores  Register   On: 07/08/2014 14:28     Assessment/Plan: The patient is status post right VATS/decortication for a Microaerophilic strep empyema.  He had previously been unsuccessfully treated for CAP at an outside hospital without improvement. Postoperatively, his white blood cell count trended down, and he clinically improved.  He was discharged home on po antibiotics, but has continued to run fevers with associated malaise and weakness.  Repeat labs yesterday showed an increased leukocytosis, to 25,000. We will admit him for further workup, including blood cultures and a CT of the chest without contrast.   Infectious disease has been  consulted, and recommends starting the patient on IV Cefipime and we will order a 2D echocardiogram to rule out endocarditis.    Hasheem Voland H, PA-C 07/09/2014 4:00 PM

## 2014-07-09 NOTE — Progress Notes (Signed)
ANTIBIOTIC CONSULT NOTE - INITIAL  Pharmacy Consult for cefepime Indication: empyema  No Known Allergies  Patient Measurements:   Body Weight: 84 kg  Vital Signs: Temp: 98.2 F (36.8 C) (04/20 1556) Temp Source: Oral (04/20 1556) BP: 121/60 mmHg (04/20 1556) Pulse Rate: 94 (04/20 1556) Intake/Output from previous day:   Intake/Output from this shift:    Labs:  Recent Labs  07/08/14 1534  WBC 25.0*  HGB 8.8*  PLT 818*  CREATININE 1.10   Estimated Creatinine Clearance: 69.6 mL/min (by C-G formula based on Cr of 1.1). No results for input(s): VANCOTROUGH, VANCOPEAK, VANCORANDOM, GENTTROUGH, GENTPEAK, GENTRANDOM, TOBRATROUGH, TOBRAPEAK, TOBRARND, AMIKACINPEAK, AMIKACINTROU, AMIKACIN in the last 72 hours.   Microbiology: Recent Results (from the past 720 hour(s))  Culture, sputum-assessment     Status: None   Collection Time: 06/10/14  7:14 AM  Result Value Ref Range Status   Specimen Description SPUTUM EXPECTORATED  Final   Special Requests NONE  Final   Sputum evaluation   Final    THIS SPECIMEN IS ACCEPTABLE. RESPIRATORY CULTURE REPORT TO FOLLOW. Performed at [facility]    Report Status 06/10/2014 FINAL  Final  Culture, respiratory (NON-Expectorated)     Status: None   Collection Time: 06/10/14  7:14 AM  Result Value Ref Range Status   Specimen Description SPUTUM EXPECTORATED  Final   Special Requests NONE  Final   Gram Stain   Final    MODERATE WBC PRESENT, PREDOMINANTLY PMN MODERATE SQUAMOUS EPITHELIAL CELLS PRESENT ABUNDANT GRAM POSITIVE COCCI IN PAIRS IN CHAINS FEW GRAM NEGATIVE RODS FEW GRAM POSITIVE RODS    Culture   Final    NORMAL OROPHARYNGEAL FLORA Performed at [facility]    Report Status 06/13/2014 FINAL  Final  Blood Culture (routine x 2)     Status: None   Collection Time: 06/26/14  5:10 PM  Result Value Ref Range Status   Specimen Description BLOOD RIGHT ARM  Final   Special Requests BOTTLES DRAWN AEROBIC ONLY 2CC   Final   Culture   Final    NO GROWTH 5 DAYS Performed at [facility]    Report Status 07/02/2014 FINAL  Final  Blood Culture (routine x 2)     Status: None   Collection Time: 06/26/14  5:15 PM  Result Value Ref Range Status   Specimen Description BLOOD LEFT ANTECUBITAL  Final   Special Requests BOTTLES DRAWN AEROBIC AND ANAEROBIC 10CC  Final   Culture   Final    NO GROWTH 5 DAYS Performed at [facility]    Report Status 07/02/2014 FINAL  Final  MRSA PCR Screening     Status: None   Collection Time: 06/26/14  5:59 PM  Result Value Ref Range Status   MRSA by PCR NEGATIVE NEGATIVE Final    Comment:        The GeneXpert MRSA Assay (FDA approved for NASAL specimens only), is one component of a comprehensive MRSA colonization surveillance program. It is not intended to diagnose MRSA infection nor to guide or monitor treatment for MRSA infections.   Urine culture     Status: None   Collection Time: 06/26/14  6:45 PM  Result Value Ref Range Status   Specimen Description URINE, CLEAN CATCH  Final   Special Requests Normal  Final   Colony Count NO GROWTH Performed at [facility]   Final   Culture NO GROWTH Performed at [facility]   Final   Report Status  06/28/2014 FINAL  Final  Fungus Culture with Smear     Status: None (Preliminary result)   Collection Time: 06/27/14  8:01 AM  Result Value Ref Range Status   Specimen Description BRONCHIAL WASHINGS RIGHT  Final   Special Requests NONE  Final   Fungal Smear   Final    NO YEAST OR FUNGAL ELEMENTS SEEN Performed at [facility]    Culture   Final    CULTURE IN PROGRESS FOR FOUR WEEKS Performed at [facility]    Report Status PENDING  Incomplete  Gram stain     Status: None   Collection Time: 06/27/14  8:01 AM  Result Value Ref Range Status   Specimen Description BRONCHIAL WASHINGS RIGHT  Final   Special Requests NONE  Final   Gram Stain   Final    FEW WBC PRESENT,  PREDOMINANTLY PMN NO ORGANISMS SEEN    Report Status 06/27/2014 FINAL  Final  AFB culture with smear     Status: None (Preliminary result)   Collection Time: 06/27/14  8:01 AM  Result Value Ref Range Status   Specimen Description BRONCHIAL WASHINGS RIGHT  Final   Special Requests NONE  Final   Acid Fast Smear   Final    NO ACID FAST BACILLI SEEN Performed at [facility]    Culture   Final    CULTURE WILL BE EXAMINED FOR 6 WEEKS BEFORE ISSUING A FINAL REPORT Performed at [facility]    Report Status PENDING  Incomplete  Culture, respiratory (NON-Expectorated)     Status: None   Collection Time: 06/27/14  8:01 AM  Result Value Ref Range Status   Specimen Description BRONCHIAL WASHINGS RIGHT  Final   Special Requests NONE  Final   Gram Stain   Final    FEW WBC PRESENT, PREDOMINANTLY PMN NO ORGANISMS SEEN Performed at [facility] Performed at [facility]    Culture   Final    NO GROWTH 2 DAYS Performed at [facility]    Report Status 06/29/2014 FINAL  Final  Fungus Culture with Smear     Status: None (Preliminary result)   Collection Time: 06/27/14  8:44 AM  Result Value Ref Range Status   Specimen Description FLUID PLEURAL RIGHT  Final   Special Requests NONE  Final   Fungal Smear   Final    NO YEAST OR FUNGAL ELEMENTS SEEN Performed at [facility]    Culture   Final    CULTURE IN PROGRESS FOR FOUR WEEKS Performed at [facility]    Report Status PENDING  Incomplete  Gram stain     Status: None   Collection Time: 06/27/14  8:44 AM  Result Value Ref Range Status   Specimen Description FLUID PLEURAL RIGHT  Final   Special Requests NONE  Final   Gram Stain   Final    ABUNDANT WBC PRESENT,BOTH PMN AND MONONUCLEAR NO ORGANISMS SEEN    Report Status 06/27/2014 FINAL  Final  Body fluid culture     Status: None   Collection Time: 06/27/14  8:44 AM  Result Value Ref Range Status   Specimen Description FLUID  PLEURAL RIGHT  Final   Special Requests NONE  Final   Gram Stain   Final    ABUNDANT WBC PRESENT, PREDOMINANTLY PMN NO ORGANISMS SEEN Performed at [facility]    Culture   Final    NO GROWTH 3 DAYS Performed at [facility]    Report Status 06/30/2014 FINAL  Final  Anaerobic culture     Status: None   Collection Time: 06/27/14  9:08 AM  Result Value Ref Range Status   Specimen Description TISSUE PLEURAL RIGHT  Final   Special Requests NONE  Final   Gram Stain   Final    ABUNDANT WBC PRESENT, PREDOMINANTLY PMN ABUNDANT GRAM POSITIVE COCCI IN PAIRS IN CHAINS Performed at [facility] Performed at [facility]    Culture   Final    NO ANAEROBES ISOLATED Performed at [facility]    Report Status 07/02/2014 FINAL  Final  Tissue culture     Status: None   Collection Time: 06/27/14  9:08 AM  Result Value Ref Range Status   Specimen Description TISSUE PLEURAL RIGHT  Final   Special Requests NONE  Final   Gram Stain   Final    ABUNDANT WBC PRESENT, PREDOMINANTLY PMN ABUNDANT GRAM POSITIVE COCCI IN PAIRS IN CHAINS Performed at [facility] Performed at [facility]    Culture   Final    ABUNDANT MICROAEROPHILIC STREPTOCOCCI Note: Standardized susceptibility testing for this organism is not available. Performed at [facility]    Report Status 06/30/2014 FINAL  Final  AFB culture with smear     Status: None (Preliminary result)   Collection Time: 06/27/14  9:08 AM  Result Value Ref Range Status   Specimen Description TISSUE PLEURAL RIGHT  Final   Special Requests NONE  Final   Acid Fast Smear   Final    NO ACID FAST BACILLI SEEN Performed at [facility]    Culture   Final    CULTURE WILL BE EXAMINED FOR 6 WEEKS BEFORE ISSUING A FINAL REPORT Performed at [facility]    Report Status PENDING  Incomplete  Gram stain     Status: None   Collection Time: 06/27/14  9:08 AM  Result Value Ref  Range Status   Specimen Description TISSUE PLEURAL RIGHT  Final   Special Requests NONE  Final   Gram Stain   Final    ABUNDANT WBC PRESENT, PREDOMINANTLY PMN ABUNDANT GRAM POSITIVE COCCI IN PAIRS IN CHAINS    Report Status 06/27/2014 FINAL  Final  Anaerobic culture     Status: None   Collection Time: 06/27/14  9:14 AM  Result Value Ref Range Status   Specimen Description FLUID PLEURAL RIGHT  Final   Special Requests ON SWAB  Final   Gram Stain   Final    ABUNDANT WBC PRESENT, PREDOMINANTLY PMN ABUNDANT GRAM POSITIVE COCCI IN PAIRS IN CHAINS Performed at [facility]    Culture   Final    NO ANAEROBES ISOLATED Performed at [facility]    Report Status 07/02/2014 FINAL  Final    Medical History: Past Medical History  Diagnosis Date  . Hypertension   . Urinary frequency   . Low serum testosterone level     Medications:  Prescriptions prior to admission  Medication Sig Dispense Refill Last Dose  . acetaminophen (TYLENOL) 650 MG CR tablet Take 650 mg by mouth every 8 (eight) hours as needed for fever.   Taking  . amLODipine (NORVASC) 2.5 MG tablet Take 1 tablet by mouth daily.   Taking  . amoxicillin-clavulanate (AUGMENTIN) 875-125 MG per tablet Take 1 tablet by mouth 2 (two) times daily. X 2 weeks 28 tablet 0 Taking  . aspirin EC 81 MG tablet Take  81 mg by mouth daily.   Taking  . Calcium Carbonate-Vitamin D (CALCIUM + D PO) Take 1 tablet by mouth daily.   Taking  . guaiFENesin (MUCINEX) 600 MG 12 hr tablet Take 1 tablet (600 mg total) by mouth 2 (two) times daily.   Taking  . Lactobacillus (ACIDOPHILUS PROBIOTIC) 10 MG TABS Take 1 tablet by mouth daily.   Taking  . lisinopril (PRINIVIL,ZESTRIL) 20 MG tablet Take 1 tablet by mouth daily.   Taking  . Multiple Vitamins-Minerals (PRESERVISION AREDS 2 PO) Take 2 tablets by mouth daily.   Taking  . Omega-3 Fatty Acids (FISH OIL) 1200 MG CAPS Take 1 capsule by mouth daily.   Taking  . oxyCODONE (OXY  IR/ROXICODONE) 5 MG immediate release tablet Take 1-2 tablets (5-10 mg total) by mouth every 4 (four) hours as needed for severe pain. (Patient not taking: Reported on 07/08/2014) 30 tablet 0 Not Taking  . SF 5000 PLUS 1.1 % CREA dental cream      . tamsulosin (FLOMAX) 0.4 MG CAPS capsule Take 1 capsule by mouth daily.   Taking  . testosterone (ANDROGEL) 50 MG/5GM (1%) GEL Place 5 g onto the skin daily.   Not Taking  . zolpidem (AMBIEN) 5 MG tablet Take 1 tablet (5 mg total) by mouth at bedtime as needed for sleep. 30 tablet 0 Taking   Assessment: 69 year old man with a recent admission to [facility] from [facility]/7 to 4/15 for empyema.  Received Zosyn followed by augmentin for microaerophilic strep.  He returns to the hospital today and ID has ordered cefepime. Plan:  Cefepime 1g q8h  Follow up renal function and cultures  Candie Mile 07/09/2014,4:39 PM

## 2014-07-10 ENCOUNTER — Encounter (HOSPITAL_COMMUNITY): Payer: Self-pay | Admitting: General Practice

## 2014-07-10 LAB — CBC
HEMATOCRIT: 26.2 % — AB (ref 39.0–52.0)
HEMOGLOBIN: 8.5 g/dL — AB (ref 13.0–17.0)
MCH: 29.5 pg (ref 26.0–34.0)
MCHC: 32.4 g/dL (ref 30.0–36.0)
MCV: 91 fL (ref 78.0–100.0)
Platelets: 765 10*3/uL — ABNORMAL HIGH (ref 150–400)
RBC: 2.88 MIL/uL — ABNORMAL LOW (ref 4.22–5.81)
RDW: 14.4 % (ref 11.5–15.5)
WBC: 21.1 10*3/uL — ABNORMAL HIGH (ref 4.0–10.5)

## 2014-07-10 LAB — BASIC METABOLIC PANEL
Anion gap: 9 (ref 5–15)
BUN: 14 mg/dL (ref 6–23)
CO2: 27 mmol/L (ref 19–32)
CREATININE: 1.19 mg/dL (ref 0.50–1.35)
Calcium: 8.1 mg/dL — ABNORMAL LOW (ref 8.4–10.5)
Chloride: 99 mmol/L (ref 96–112)
GFR calc Af Amer: 70 mL/min — ABNORMAL LOW (ref >90)
GFR calc non Af Amer: 61 mL/min — ABNORMAL LOW (ref >90)
Glucose, Bld: 141 mg/dL — ABNORMAL HIGH (ref 70–99)
Potassium: 4.8 mmol/L (ref 3.5–5.1)
Sodium: 135 mmol/L (ref 135–145)

## 2014-07-10 MED ORDER — ENSURE ENLIVE PO LIQD
237.0000 mL | Freq: Two times a day (BID) | ORAL | Status: DC
  Administered 2014-07-10 – 2014-07-13 (×5): 237 mL via ORAL

## 2014-07-10 NOTE — Progress Notes (Signed)
Nutrition Brief Note  Patient identified on the Malnutrition Screening Tool (MST) Report.  Per weight readings below, patient has had a 4% weight loss x 1 month; not significant for time frame.  Wt Readings from Last 15 Encounters:  07/08/14 186 lb (84.369 kg)  07/02/14 193 lb 12.8 oz (87.907 kg)  06/26/14 193 lb (87.544 kg)  06/07/14 195 lb (88.451 kg)    BMI = 25.2 kg/m2.  Patient meets criteria for Overweight based on current BMI.   Current diet order is Regular, patient is consuming approximately 75% of meals at this time. Labs and medications reviewed.   No nutrition interventions warranted at this time. If nutrition issues arise, please consult RD.   Arthur Holms, RD, LDN Pager #: [phone] After-Hours Pager #: [phone]

## 2014-07-10 NOTE — OR Nursing (Signed)
Assessing patient's chart for CT image per Dr. Servando Snare while he is in surgery.

## 2014-07-10 NOTE — Progress Notes (Addendum)
[name] [name]       [place], [address]             [phone]               Subjective: Rested well last night, not as much cough this am.   Objective: Vital signs in last 24 hours: Patient Vitals for the past 24 hrs:  BP Temp Temp src Pulse Resp SpO2  07/10/14 0441 121/62 mmHg 98.4 F (36.9 C) Oral (!) 111 20 95 %  07/09/14 1958 138/65 mmHg 98.6 F (37 C) Oral (!) 106 18 97 %  07/09/14 1556 121/60 mmHg 98.2 F (36.8 C) Oral 94 18 98 %   Current Weight  07/08/14 186 lb (84.369 kg)     Intake/Output from previous day: 04/20 0701 - 04/21 0700 In: 220 [P.O.:120; IV Piggyback:100] Out: 500 [Urine:500]    PHYSICAL EXAM:  Heart: RRR, tachy at times into 110s Lungs: Diminished BS on R Wound: Clean and dry Extremities: No edema    Lab Results: CBC: Recent Labs  07/09/14 1943 07/10/14 0512  WBC 20.5* 21.1*  HGB 8.0* 8.5*  HCT 24.8* 26.2*  PLT 746* 765*   BMET:  Recent Labs  07/08/14 1534 07/09/14 1943 07/10/14 0512  NA 135  --  135  K 5.4*  --  4.8  CL 100  --  99  CO2 25  --  27  GLUCOSE 93  --  141*  BUN 20  --  14  CREATININE 1.10 1.29 1.19  CALCIUM 8.2*  --  8.1*    PT/INR: No results for input(s): LABPROT, INR in the last 72 hours.  CT Chest:  FINDINGS:  Unremarkable appearance the superficial soft tissues of the chest wall. No axillary adenopathy. No supraclavicular adenopathy.  Unremarkable appearance of the thoracic inlet. Unremarkable thyroid gland.  Heart size within normal limits. Trace pericardial fluid/ thickening.  Calcifications of left main, left anterior descending, circumflex, right coronary arteries.  Multiple mediastinal lymph nodes, similar in size in distribution to the comparison, compatible with reactive lymph nodes. Index lymph node in the right peritracheal nodal station measures 15 mm.  Calcifications of the thoracic aorta and branch vessels again noted. No periaortic  fluid.  Compared to the prior CT chest there is significantly decreased volume of right pleural fluid, though there is persisting fluid with internal air loculations, status post video-assisted thoracoscopy and empyema drainage.  Improved aeration of right upper lobe, right middle lobe, and right lower lobe.  Linear and ground-glass opacities of the right lobe likely represent element of atelectasis/ scarring.  Residual fluid does not appear to involve the parenchyma of the right lung. No cavitation of the right lung is identified.  Left lung is well aerated with no confluent airspace disease. No left-sided pleural effusion.  Upper abdomen: Small hiatal hernia with otherwise unremarkable appearance of the visualized upper abdomen.  Musculoskeletal:  No displaced fracture.  Mild degenerative changes of the spine.  IMPRESSION: Significantly decreased right-sided pleural fluid status post video-assisted thoracoscopy and empyema drainage. There is residual fluid with internal loculations, potentially postoperative in nature.  Improved aeration of the right lung, with mixed atelectasis/ scarring and/or interstitial edema.  Reactive mediastinal adenopathy.  Mild atherosclerosis with evidence of coronary artery disease.  Additional incidental findings as above   Assessment/Plan: WBC down since seen in office (25K->21K). Afebrile since admission.  CT unremarkable, blood cultures pending.  For 2D echo today to R/O  endocarditis. Continue Maxipime D#2. Appreciate ID's input.  Pt's wife is concerned about the possibility of Lyme disease as he has been bitten by ticks - if other sources negative, may need to test.   LOS: 1 day    [name] [name],[name] [name] 07/10/2014  Feels better today Wbc decreased some Back on IV antibiotics I have seen and examined [name] [name] and agree with the above assessment  and plan.  Grace Isaac MD [facility] [phone] Office [phone] 07/10/2014  6:17 PM

## 2014-07-10 NOTE — Progress Notes (Signed)
  Echocardiogram 2D Echocardiogram has been performed.  [name] [name] M 07/10/2014, 2:36 PM

## 2014-07-10 NOTE — Progress Notes (Signed)
Utilization review completed.  

## 2014-07-10 NOTE — Progress Notes (Signed)
[facility]  Date of Admission:  07/09/2014  Antibiotics: cefepime  Subjective: Feels better  Objective: Temp:  [98.2 F (36.8 C)-98.6 F (37 C)] 98.4 F (36.9 C) (04/21 0441) Pulse Rate:  [94-111] 111 (04/21 0441) Resp:  [18-20] 20 (04/21 0441) BP: (121-138)/(60-65) 121/62 mmHg (04/21 0441) SpO2:  [95 %-98 %] 95 % (04/21 0441)  General: awake, alert, nad Skin: no rashes Lungs: CTA B Cor: RRR without m Abdomen: soft, nt, nd Ext: no edema  Lab Results Lab Results  Component Value Date   WBC 21.1* 07/10/2014   HGB 8.5* 07/10/2014   HCT 26.2* 07/10/2014   MCV 91.0 07/10/2014   PLT 765* 07/10/2014    Lab Results  Component Value Date   CREATININE 1.19 07/10/2014   BUN 14 07/10/2014   NA 135 07/10/2014   K 4.8 07/10/2014   CL 99 07/10/2014   CO2 27 07/10/2014    Lab Results  Component Value Date   ALT 60* 06/29/2014   AST 47* 06/29/2014   ALKPHOS 60 06/29/2014   BILITOT 0.4 06/29/2014      Microbiology: No results found for this or any previous visit (from the past 240 hour(s)).  Studies/Results: Ct Chest Wo Contrast  07/09/2014   CLINICAL DATA:  69 year old male with a history of empyema and cough  EXAM: CT CHEST WITHOUT CONTRAST  TECHNIQUE: Multidetector CT imaging of the chest was performed following the standard protocol without IV contrast.  COMPARISON:  X-ray 07/08/2014, CT chest 06/26/2014  FINDINGS: Chest:  Unremarkable appearance the superficial soft tissues of the chest wall. No axillary adenopathy. No supraclavicular adenopathy.  Unremarkable appearance of the thoracic inlet. Unremarkable thyroid gland.  Heart size within normal limits. Trace pericardial fluid/ thickening.  Calcifications of left main, left anterior descending, circumflex, right coronary arteries.  Multiple mediastinal lymph nodes, similar in size in distribution to the comparison, compatible with reactive lymph nodes. Index lymph node in the right  peritracheal nodal station measures 15 mm.  Calcifications of the thoracic aorta and branch vessels again noted. No periaortic fluid.  Compared to the prior CT chest there is significantly decreased volume of right pleural fluid, though there is persisting fluid with internal air loculations, status post video-assisted thoracoscopy and empyema drainage.  Improved aeration of right upper lobe, right middle lobe, and right lower lobe.  Linear and ground-glass opacities of the right lobe likely represent element of atelectasis/ scarring.  Residual fluid does not appear to involve the parenchyma of the right lung. No cavitation of the right lung is identified.  Left lung is well aerated with no confluent airspace disease. No left-sided pleural effusion.  Upper abdomen: Small hiatal hernia with otherwise unremarkable appearance of the visualized upper abdomen.  Musculoskeletal:  No displaced fracture.  Mild degenerative changes of the spine.  IMPRESSION: Significantly decreased right-sided pleural fluid status post video-assisted thoracoscopy and empyema drainage. There is residual fluid with internal loculations, potentially postoperative in nature.  Improved aeration of the right lung, with mixed atelectasis/ scarring and/or interstitial edema.  Reactive mediastinal adenopathy.  Mild atherosclerosis with evidence of coronary artery disease.  Additional incidental findings as above.  Signed,  Dulcy Fanny. Earleen Newport, DO  [facility]  [facility]   Electronically Signed   By: Corrie Mckusick D.O.   On: 07/09/2014 21:25    Assessment/Plan:  1) fever, leukocytosis - unknown etiology.  CT scan unremarkable.  WBC improved and no fever since admission.  No  other source identified.  Don't think Lyme disease since he does not live in [place], no local transmission of Lyme documented, no s/sx of RMSF.  Could be viral in origin.  Waiting for TTE.  Blood cultures ngtd.  Will continue to  observe.    Scharlene Gloss, [facility] [place] [email or web address] [phone] pager   [phone] cell 07/10/2014, 2:46 PM

## 2014-07-10 NOTE — Progress Notes (Signed)
Pt a/o, no c/o pain, pt awaiting echo, vss, pt stable

## 2014-07-11 LAB — CBC
HEMATOCRIT: 24.8 % — AB (ref 39.0–52.0)
HEMOGLOBIN: 8.1 g/dL — AB (ref 13.0–17.0)
MCH: 29.3 pg (ref 26.0–34.0)
MCHC: 32.7 g/dL (ref 30.0–36.0)
MCV: 89.9 fL (ref 78.0–100.0)
Platelets: 706 10*3/uL — ABNORMAL HIGH (ref 150–400)
RBC: 2.76 MIL/uL — AB (ref 4.22–5.81)
RDW: 14.3 % (ref 11.5–15.5)
WBC: 16.7 10*3/uL — ABNORMAL HIGH (ref 4.0–10.5)

## 2014-07-11 MED ORDER — DOCUSATE SODIUM 100 MG PO CAPS
100.0000 mg | ORAL_CAPSULE | Freq: Two times a day (BID) | ORAL | Status: DC
  Administered 2014-07-11 – 2014-07-15 (×9): 100 mg via ORAL
  Filled 2014-07-11 (×10): qty 1

## 2014-07-11 NOTE — Discharge Instructions (Signed)
Fever, Adult A fever is a higher than normal body temperature. In an adult, an oral temperature around 98.6 F (37 C) is considered normal. A temperature of 100.4 F (38 C) or higher is generally considered a fever. Mild or moderate fevers generally have no long-term effects and often do not require treatment. Extreme fever (greater than or equal to 106 F or 41.1 C) can cause seizures. The sweating that may occur with repeated or prolonged fever may cause dehydration. Elderly people can develop confusion during a fever. A measured temperature can vary with:  Age.  Time of day. You for  Method of measurement (mouth, underarm, rectal, or ear). The fever is confirmed by taking a temperature with a thermometer. Temperatures can be taken different ways. Some methods are accurate and some are not.  An oral temperature is used most commonly. Electronic thermometers are fast and accurate.  An ear temperature will only be accurate if the thermometer is positioned as recommended by the manufacturer.  A rectal temperature is accurate and done for those adults who have a condition where an oral temperature cannot be taken.  An underarm (axillary) temperature is not accurate and not recommended. Fever is a symptom, not a disease.  CAUSES   Infections commonly cause fever.  Some noninfectious causes for fever include:  Some arthritis conditions.  Some thyroid or adrenal gland conditions.  Some immune system conditions.  Some types of cancer.  A medicine reaction.  High doses of certain street drugs such as methamphetamine.  Dehydration.  Exposure to high outside or room temperatures.  Occasionally, the source of a fever cannot be determined. This is sometimes called a "fever of unknown origin" (FUO).  Some situations may lead to a temporary rise in body temperature that may go away on its own. Examples are:  Childbirth.  Surgery.  Intense exercise. HOME CARE INSTRUCTIONS    Take appropriate medicines for fever. Follow dosing instructions carefully. If you use acetaminophen to reduce the fever, be careful to avoid taking other medicines that also contain acetaminophen. Do not take aspirin for a fever if you are younger than age 18. There is an association with Reye's syndrome. Reye's syndrome is a rare but potentially deadly disease.  If an infection is present and antibiotics have been prescribed, take them as directed. Finish them even if you start to feel better.  Rest as needed.  Maintain an adequate fluid intake. To prevent dehydration during an illness with prolonged or recurrent fever, you may need to drink extra fluid.Drink enough fluids to keep your urine clear or pale yellow.  Sponging or bathing with room temperature water may help reduce body temperature. Do not use ice water or alcohol sponge baths.  Dress comfortably, but do not over-bundle. SEEK MEDICAL CARE IF:   You are unable to keep fluids down.  You develop vomiting or diarrhea.  You are not feeling at least partly better after 3 days.  You develop new symptoms or problems. SEEK IMMEDIATE MEDICAL CARE IF:   You have shortness of breath or trouble breathing.  You develop excessive weakness.  You are dizzy or you faint.  You are extremely thirsty or you are making little or no urine.  You develop new pain that was not there before (such as in the head, neck, chest, back, or abdomen).  You have persistent vomiting and diarrhea for more than 1 to 2 days.  You develop a stiff neck or your eyes become sensitive to light.  You  develop a skin rash.  You have a fever or persistent symptoms for more than 2 to 3 days.  You have a fever and your symptoms suddenly get worse. MAKE SURE YOU:   Understand these instructions.  Will watch your condition.  Will get help right away if you are not doing well or get worse. Document Released: 08/31/2000 Document Revised: 07/22/2013  Document Reviewed: 01/06/2011 [facility] Patient Information 2015 [place], [place]. This information is not intended to replace advice given to you by your health care provider. Make sure you discuss any questions you have with your health care provider. Thoracoscopy Care After Refer to this sheet in the next few weeks. These discharge instructions provide you with general information on caring for yourself after you leave the hospital. Your caregiver may also give you specific instructions. Your treatment has been planned according to the most current medical practices available, but unavoidable complications sometimes occur. If you have any problems or questions after discharge, call your caregiver. HOME CARE INSTRUCTIONS   Remove the bandage (dressing) over your chest tube site as directed by your caregiver.  It is normal to be sore for a couple weeks following surgery. See your caregiver if this seems to be getting worse rather than better.  Only take over-the-counter or prescription medicines for pain, discomfort, or fever as directed by your caregiver. It is very important to take pain medicine when you need it so that you will cough and breathe deeply enough to clear mucus (phlegm) and expand your lungs.  If it hurts to cough, hold a pillow against your chest when you cough. This may help with the discomfort. In spite of the discomfort, cough frequently, as this helps protect against getting an infection in your lung (pneumonia).  Taking deep breaths keeps lungs inflated and protects against pneumonia. Most patients will go home with an incentive spirometer that encourages deep breathing.  You may resume a normal diet and activities as directed.  Use showers for bathing until you see your caregiver, or as instructed.  Change dressings if necessary or as directed.  Avoid lifting or driving until you are instructed otherwise.  Make an appointment to see your caregiver for stitch (suture)  or staple removal when instructed.  Do not travel by airplane for 2 weeks after the chest tube is removed. SEEK MEDICAL CARE IF:   You are bleeding from your wounds.  You have redness, swelling, or increasing pain in the wounds.  Your heartbeat feels irregular or very fast.  There is pus coming from your wounds.  There is a bad smell coming from the wound or dressing. SEEK IMMEDIATE MEDICAL CARE IF:   You have a fever.  You develop a rash.  You have difficulty breathing.  You develop any reaction or side effects to medicines given.  You develop lightheadedness or feel faint.  You develop shortness of breath or chest pain. MAKE SURE YOU:   Understand these instructions.  Will watch your condition.  Will get help right away if you are not doing well or get worse. Document Released: 09/24/2004 Document Revised: 05/30/2011 Document Reviewed: 08/25/2010 [facility] Patient Information 2015 [place], [place]. This information is not intended to replace advice given to you by your health care provider. Make sure you discuss any questions you have with your health care provider.

## 2014-07-11 NOTE — Progress Notes (Signed)
[facility] for Infectious Disease  Date of Admission:  07/09/2014  Antibiotics: cefepime  Subjective: Afebrile, continues to feel better  Objective: Temp:  [98.5 F (36.9 C)-98.7 F (37.1 C)] 98.6 F (37 C) (04/22 0441) Pulse Rate:  [97-107] 98 (04/22 0441) Resp:  [18] 18 (04/22 0441) BP: (109-133)/(49-66) 109/49 mmHg (04/22 0441) SpO2:  [95 %-97 %] 96 % (04/22 0441)  General: awake, alert, nad Skin: no rashes Lungs: CTA B Cor: RRR without m Abdomen: soft, nt, nd Ext: no edema  Lab Results Lab Results  Component Value Date   WBC 16.7* 07/11/2014   HGB 8.1* 07/11/2014   HCT 24.8* 07/11/2014   MCV 89.9 07/11/2014   PLT 706* 07/11/2014    Lab Results  Component Value Date   CREATININE 1.19 07/10/2014   BUN 14 07/10/2014   NA 135 07/10/2014   K 4.8 07/10/2014   CL 99 07/10/2014   CO2 27 07/10/2014    Lab Results  Component Value Date   ALT 60* 06/29/2014   AST 47* 06/29/2014   ALKPHOS 60 06/29/2014   BILITOT 0.4 06/29/2014      Microbiology: Recent Results (from the past 240 hour(s))  Culture, blood (routine x 2)     Status: None (Preliminary result)   Collection Time: 07/09/14  7:43 PM  Result Value Ref Range Status   Specimen Description BLOOD RIGHT ANTECUBITAL  Final   Special Requests   Final    BOTTLES DRAWN AEROBIC AND ANAEROBIC 10CC BLUE 5CC RED   Culture   Final           BLOOD CULTURE RECEIVED NO GROWTH TO DATE CULTURE WILL BE HELD FOR 5 DAYS BEFORE ISSUING A FINAL NEGATIVE REPORT Performed at [facility]    Report Status PENDING  Incomplete  Culture, blood (routine x 2)     Status: None (Preliminary result)   Collection Time: 07/09/14  7:49 PM  Result Value Ref Range Status   Specimen Description BLOOD RIGHT WRIST  Final   Special Requests BOTTLES DRAWN AEROBIC ONLY 10CC  Final   Culture   Final           BLOOD CULTURE RECEIVED NO GROWTH TO DATE CULTURE WILL BE HELD FOR 5 DAYS BEFORE ISSUING A FINAL NEGATIVE  REPORT Performed at [facility]    Report Status PENDING  Incomplete    Studies/Results: Ct Chest Wo Contrast  07/09/2014   CLINICAL DATA:  69 year old male with a history of empyema and cough  EXAM: CT CHEST WITHOUT CONTRAST  TECHNIQUE: Multidetector CT imaging of the chest was performed following the standard protocol without IV contrast.  COMPARISON:  X-ray 07/08/2014, CT chest 06/26/2014  FINDINGS: Chest:  Unremarkable appearance the superficial soft tissues of the chest wall. No axillary adenopathy. No supraclavicular adenopathy.  Unremarkable appearance of the thoracic inlet. Unremarkable thyroid gland.  Heart size within normal limits. Trace pericardial fluid/ thickening.  Calcifications of left main, left anterior descending, circumflex, right coronary arteries.  Multiple mediastinal lymph nodes, similar in size in distribution to the comparison, compatible with reactive lymph nodes. Index lymph node in the right peritracheal nodal station measures 15 mm.  Calcifications of the thoracic aorta and branch vessels again noted. No periaortic fluid.  Compared to the prior CT chest there is significantly decreased volume of right pleural fluid, though there is persisting fluid with internal air loculations, status post video-assisted thoracoscopy and empyema drainage.  Improved aeration of right upper lobe, right middle  lobe, and right lower lobe.  Linear and ground-glass opacities of the right lobe likely represent element of atelectasis/ scarring.  Residual fluid does not appear to involve the parenchyma of the right lung. No cavitation of the right lung is identified.  Left lung is well aerated with no confluent airspace disease. No left-sided pleural effusion.  Upper abdomen: Small hiatal hernia with otherwise unremarkable appearance of the visualized upper abdomen.  Musculoskeletal:  No displaced fracture.  Mild degenerative changes of the spine.  IMPRESSION: Significantly decreased  right-sided pleural fluid status post video-assisted thoracoscopy and empyema drainage. There is residual fluid with internal loculations, potentially postoperative in nature.  Improved aeration of the right lung, with mixed atelectasis/ scarring and/or interstitial edema.  Reactive mediastinal adenopathy.  Mild atherosclerosis with evidence of coronary artery disease.  Additional incidental findings as above.  Signed,  Dulcy Fanny. Earleen Newport, DO  [facility]  [facility]   Electronically Signed   By: Corrie Mckusick D.O.   On: 07/09/2014 21:25    Assessment/Plan:  1) fever, leukocytosis - unknown etiology.  CT scan unremarkable.  WBC improved and no fever since admission.  No other source identified.   TTE unremarkable WBC continues to improve Strep can certainly be associated with endocarditis so I feel a TEE is indicated to r/o endocarditis completely even though TTE is unremarkable   COMER, Herbie [place], [facility] [place] [email or web address] [phone] pager   [phone] cell 07/11/2014, 10:22 AM

## 2014-07-11 NOTE — Consult Note (Signed)
   [facility] Inpatient Consult   07/11/2014  [name] [name] 02/16/1946 [id number] Patient evaluated for community based chronic disease management services with [place] Management Program as a benefit of patient's Medicare Insurance. Spoke with patient and his wife at bedside to explain [place] Management services.  Patient will receive post discharge transition of care call and will be evaluated for monthly home visits for assessments and disease process education. Consent form signed. Left contact information and [facility] literature at bedside. Made Inpatient Case Manager aware that [facility] Management following. Of note, [facility] Care Management services does not replace or interfere with any services that are arranged by inpatient case management or social work.  For additional questions or referrals please contact:   Natividad Brood, RN BSN [facility] Liaison  [phone] business mobile phone

## 2014-07-11 NOTE — Discharge Summary (Signed)
Physician Discharge Summary  Patient ID: [name] [name] MRN: [id number] DOB/AGE: 11/11/1945 69 y.o.  Admit date: 07/09/2014 Discharge date: 07/11/2014  Admission Diagnoses: Readmitted to Hospital following recent VATS for drainage of empyema. Leukocytosis Low-grade fever  Discharge Diagnoses:  Same  Patient Active Problem List   Diagnosis Date Noted  . Fever 07/09/2014  . Empyema of right pleural space 06/26/2014  . Empyema lung 06/26/2014  . SIRS (systemic inflammatory response syndrome) 06/10/2014  . CAP (community acquired pneumonia) 06/07/2014  . Acute respiratory failure with hypoxia 06/07/2014  . Acute renal injury 06/07/2014  . Dehydration 06/07/2014  . Hypertension 06/07/2014  . BPH (benign prostatic hypertrophy) 06/07/2014  . COPD (chronic obstructive pulmonary disease) 06/07/2014   HPI: [name] [name] is a 69 y.o. male who is known to [facility] from a recent admission (4/7-15/2016) for empyema. He underwent a right VATS/decortication by Dr. Servando Snare on 06/27/2014. Cultures were positive for microaerophilic strep. He was treated with a postop course of IV Zosyn and was switched to po Augmentin at discharge. His white blood cell count was trending down at the time of discharge.  Since he went home, he reports continued weakness and malaise. He has been taking scheduled Tylenol 650 mg q8 hrs and his temperature has been hovering between 99-100, but once he stopped the Tylenol, his fever spiked to 101. He has been mildly dyspneic with activity, but overall feels this is stable since surgery. He also continues with a productive cough, mostly of thick, clear sputum. He denies chest pain, dysuria, purulent sputum, headache or chills. Appetite has been improving and he denies diarrhea, nausea or vomiting. He was seen in the office yesterday for follow up with Dr. Servando Snare and labs revealed a leukocytosis up to 25,000 from 18,000 at discharge. Because of his continued fevers and  leukocytosis, was  admitted for further evaluation.   Hospital course:  The patient was admitted as stated for further evaluation and treatment. His white blood cell count was 25,000. A CT scan of the chest was obtained and in addition in infectious disease consultation was obtained. He was started on intravenous cefepime. Additionally a 2-D echocardiogram was obtained to rule out endocarditis. The CT scan was improved showing decreased right-sided pleural fluid. There was some residual fluid with internal loculations, potentially postoperative in nature. Overall the aeration of the right lung was felt to be improved. There was some reactive mediastinal adenopathy. All of this felt to be expected findings. The echocardiogram showed no evidence of endocarditis. His leukocytosis continues to improve with most recent white blood cell count of 16.77. Blood cultures have been negative.  He will remain on IV ABX per ID recommendations for another week.  ID will assess patient at that time and determine need for more treatment. Tentatively he is felt that he will be stable for discharge in the next 24-48 hours pending ongoing reevaluation of his condition.  Discharged Condition: good  Consults: ID  Significant Diagnostic Studies: CT scan of chest 2-D echocardiogram  Treatments: antibiotics: cefipime  Disposition: 01-Home or Self Care   medications at time of discharge:    Medication List    STOP taking these medications        amoxicillin-clavulanate 875-125 MG per tablet  Commonly known as:  AUGMENTIN      TAKE these medications        acetaminophen 500 MG tablet  Commonly known as:  TYLENOL  Take 500 mg by mouth every 8 (eight) hours as needed for mild pain  or fever.     ACIDOPHILUS PROBIOTIC 10 MG Tabs  Take 1 tablet by mouth daily.     ALPRAZolam 0.25 MG tablet  Commonly known as:  XANAX  Take 1 tablet (0.25 mg total) by mouth at bedtime as needed for anxiety or sleep.      amLODipine 2.5 MG tablet  Commonly known as:  NORVASC  Take 1 tablet by mouth daily.     aspirin EC 81 MG tablet  Take 81 mg by mouth daily.     CALCIUM CITRATE + D3 PO  Take 1 tablet by mouth daily.     ceFEPIme 1 g in dextrose 5 % 50 mL  Inject 1 g into the vein every 8 (eight) hours.     Fish Oil 1200 MG Caps  Take 1 capsule by mouth daily.     guaiFENesin 600 MG 12 hr tablet  Commonly known as:  MUCINEX  Take 1 tablet (600 mg total) by mouth 2 (two) times daily.     lisinopril 20 MG tablet  Commonly known as:  PRINIVIL,ZESTRIL  Take 1 tablet by mouth daily.     oxyCODONE 5 MG immediate release tablet  Commonly known as:  Oxy IR/ROXICODONE  Take 1-2 tablets (5-10 mg total) by mouth every 4 (four) hours as needed for severe pain.     PHYTOSTEROLS PO  Take 1 tablet by mouth daily.     PRESERVISION AREDS 2 PO  Take 2 tablets by mouth daily.     SF 5000 PLUS 1.1 % Crea dental cream  Generic drug:  sodium fluoride  Place 1 application onto teeth at bedtime.     tamsulosin 0.4 MG Caps capsule  Commonly known as:  FLOMAX  Take 1 capsule by mouth daily.     testosterone 50 MG/5GM (1%) Gel  Commonly known as:  ANDROGEL  Place 5 g onto the skin daily.     zolpidem 5 MG tablet  Commonly known as:  AMBIEN  Take 1 tablet (5 mg total) by mouth at bedtime as needed for sleep.               Follow-up Information    Follow up with Grace Isaac, MD.   Specialty:  Cardiothoracic Surgery   Why:  07/22/2014 at 3:45 PM to see Dr. Servando Snare. Please obtain a chest x-ray at [facility] at 3:15 PM. [facility] imaging is located in the same office complex.   Contact information:   [address] [place] [address] [phone]       Signed: John Giovanni 07/11/2014, 11:54 AM

## 2014-07-11 NOTE — Progress Notes (Addendum)
[address]       [place] [address]             [phone]               Subjective: Feels better in general today.  Less cough, no other new complaints.    Objective: Vital signs in last 24 hours: Patient Vitals for the past 24 hrs:  BP Temp Temp src Pulse Resp SpO2  07/11/14 0441 (!) 109/49 mmHg 98.6 F (37 C) Oral 98 18 96 %  07/10/14 2047 117/66 mmHg 98.5 F (36.9 C) Oral 97 18 95 %  07/10/14 1446 (!) 133/55 mmHg 98.7 F (37.1 C) Oral (!) 107 18 97 %   Current Weight  07/08/14 186 lb (84.369 kg)     Intake/Output from previous day: 04/21 0701 - 04/22 0700 In: 59 [P.O.:480; I.V.:10] Out: -     PHYSICAL EXAM:  Heart: RRR Lungs: Slightly decreased BS R base,but improved Wound: Clean and dry     Lab Results: CBC: Recent Labs  07/10/14 0512 07/11/14 0518  WBC 21.1* 16.7*  HGB 8.5* 8.1*  HCT 26.2* 24.8*  PLT 765* 706*   BMET:  Recent Labs  07/08/14 1534 07/09/14 1943 07/10/14 0512  NA 135  --  135  K 5.4*  --  4.8  CL 100  --  99  CO2 25  --  27  GLUCOSE 93  --  141*  BUN 20  --  14  CREATININE 1.10 1.29 1.19  CALCIUM 8.2*  --  8.1*    PT/INR: No results for input(s): LABPROT, INR in the last 72 hours.  Echo: Study Conclusions  - Left ventricle: There was moderate concentric hypertrophy. Systolic function was normal. The estimated ejection fraction was in the range of 55% to 60%. Doppler parameters are consistent with abnormal left ventricular relaxation (grade 1 diastolic dysfunction). - Mitral valve: Mildly calcified annulus.  Transthoracic echocardiography. M-mode, complete 2D, spectral Doppler, and color Doppler. Birthdate: Patient birthdate: [birth year]. Age: Patient is 69 yr old. Sex: Gender: male. BMI: 25.2 kg/m^2. Blood pressure:   121/62 Patient status: Inpatient. Study date: Study date: 07/10/2014. Study time: 02:02 PM. Location:  Bedside.  -------------------------------------------------------------------  ------------------------------------------------------------------- Left ventricle: There was moderate concentric hypertrophy. Systolic function was normal. The estimated ejection fraction was in the range of 55% to 60%. Doppler parameters are consistent with abnormal left ventricular relaxation (grade 1 diastolic dysfunction).  ------------------------------------------------------------------- Aortic valve:  Structurally normal valve.  Cusp separation was normal. Doppler: Transvalvular velocity was within the normal range. There was no stenosis. There was no regurgitation.  ------------------------------------------------------------------- Mitral valve:  Mildly calcified annulus. Leaflet separation was normal. Doppler: Transvalvular velocity was within the normal range. There was no evidence for stenosis. There was no regurgitation.  Peak gradient (D): 3 mm Hg.  ------------------------------------------------------------------- Left atrium: The atrium was normal in size.  ------------------------------------------------------------------- Right ventricle: The cavity size was normal. Wall thickness was normal. Systolic function was normal.  ------------------------------------------------------------------- Pulmonic valve:  Structurally normal valve.  Cusp separation was normal. Doppler: Transvalvular velocity was within the normal range. There was no regurgitation.  ------------------------------------------------------------------- Tricuspid valve:  Structurally normal valve.  Leaflet separation was normal. Doppler: Transvalvular velocity was within the normal range. There was no regurgitation.  ------------------------------------------------------------------- Right atrium: The atrium was normal in  size.  ------------------------------------------------------------------- Pericardium: There was no pericardial effusion.  ------------------------------------------------------------------- Systemic veins: Inferior vena cava: The vessel was normal in size. The  respirophasic diameter changes were in the normal range (>= 50%), consistent with normal central venous pressure.   Assessment/Plan: Leukocytosis continues to improve.  Remains afebrile.   Blood cx's negative so far. Echo as above, no evidence of endocarditis. Continue Maxipime D#3.   Hopefully if he continues to improve and no other source of fever/WBC can be found, we can consider discharge home in the next few days.  Will need to decide whether to continue IV abx at home vs switch to po.   LOS: 2 days    COLLINS,GINA H 07/11/2014  ID has suggested TEE,  TTE did not suggest abnormality, not clear if they contacted cardiology. Will need ID input as to timeing of DC and home antibiotics, iv or PO  Grace Isaac MD      [address] [place] [address] Office [phone]   Beeper [phone]  07/11/2014 4:32 PM

## 2014-07-12 LAB — CBC
HCT: 24.6 % — ABNORMAL LOW (ref 39.0–52.0)
HEMOGLOBIN: 7.9 g/dL — AB (ref 13.0–17.0)
MCH: 28.9 pg (ref 26.0–34.0)
MCHC: 32.1 g/dL (ref 30.0–36.0)
MCV: 90.1 fL (ref 78.0–100.0)
Platelets: 701 10*3/uL — ABNORMAL HIGH (ref 150–400)
RBC: 2.73 MIL/uL — ABNORMAL LOW (ref 4.22–5.81)
RDW: 14.3 % (ref 11.5–15.5)
WBC: 15.1 10*3/uL — ABNORMAL HIGH (ref 4.0–10.5)

## 2014-07-12 MED ORDER — SODIUM CHLORIDE 0.9 % IV SOLN
INTRAVENOUS | Status: DC
  Administered 2014-07-12: 14:00:00 via INTRAVENOUS
  Administered 2014-07-14: 250 mL via INTRAVENOUS

## 2014-07-12 MED ORDER — ALPRAZOLAM 0.25 MG PO TABS
0.2500 mg | ORAL_TABLET | Freq: Every evening | ORAL | Status: DC | PRN
Start: 2014-07-12 — End: 2014-07-15
  Administered 2014-07-12 – 2014-07-14 (×3): 0.25 mg via ORAL
  Filled 2014-07-12 (×3): qty 1

## 2014-07-12 NOTE — Progress Notes (Signed)
      [address]       [place] [address]             [phone]          Subjective: Looks and feels better, only c/o currently is problem sleeping, some sweats  Objective: Vital signs in last 24 hours: Temp:  [97.9 F (36.6 C)-99.2 F (37.3 C)] 98.2 F (36.8 C) (04/23 0751) Pulse Rate:  [96-102] 96 (04/23 0500) Cardiac Rhythm:  [-] Normal sinus rhythm (04/23 0733) Resp:  [17-19] 17 (04/23 0500) BP: (105-136)/(47-63) 136/59 mmHg (04/23 0500) SpO2:  [94 %-97 %] 94 % (04/23 0500)  Hemodynamic parameters for last 24 hours:    Intake/Output from previous day: 04/22 0701 - 04/23 0700 In: 360 [P.O.:360] Out: -  Intake/Output this shift:    General appearance: alert, cooperative and no distress Heart: regular rate and rhythm Lungs: mild exp wheeze Abdomen: soft, non-tender Extremities: no edema Wound: incis healing well  Lab Results:  Recent Labs  07/11/14 0518 07/12/14 0415  WBC 16.7* 15.1*  HGB 8.1* 7.9*  HCT 24.8* 24.6*  PLT 706* 701*   BMET:  Recent Labs  07/09/14 1943 07/10/14 0512  NA  --  135  K  --  4.8  CL  --  99  CO2  --  27  GLUCOSE  --  141*  BUN  --  14  CREATININE 1.29 1.19  CALCIUM  --  8.1*    PT/INR: No results for input(s): LABPROT, INR in the last 72 hours. ABG    Component Value Date/Time   PHART 7.450 06/28/2014 0500   HCO3 23.4 06/28/2014 0500   TCO2 24.5 06/28/2014 0500   ACIDBASEDEF 0.1 06/28/2014 0500   O2SAT 95.6 06/28/2014 0500   CBG (last 3)  No results for input(s): GLUCAP in the last 72 hours.  Meds Scheduled Meds: . amLODipine  2.5 mg Oral Daily  . aspirin EC  81 mg Oral Daily  . ceFEPime (MAXIPIME) IV  1 g Intravenous 3 times per day  . docusate sodium  100 mg Oral BID  . enoxaparin (LOVENOX) injection  40 mg Subcutaneous Q24H  . feeding supplement (ENSURE ENLIVE)  237 mL Oral BID BM  . guaiFENesin  600 mg Oral BID  . lisinopril  20 mg Oral Daily  . omega-3 acid ethyl esters  1 g  Oral Daily  . sodium chloride  3 mL Intravenous Q12H  . tamsulosin  0.4 mg Oral Daily   Continuous Infusions:  PRN Meds:.sodium chloride, acetaminophen **OR** acetaminophen, alum & mag hydroxide-simeth, bisacodyl, magnesium hydroxide, ondansetron **OR** ondansetron (ZOFRAN) IV, oxyCODONE, sodium chloride, zolpidem  Xrays No results found.  Assessment/Plan:  1 Improving clinically, afebrile 2 Leukocytosis conts to trend favorably, conts maxepime. Anemia is stable with HCT 24 3 TEE on Monday per patient 4 Requests xanax which he has been prescribed previously- will order prn daily dose   LOS: 3 days    Sheylin Scharnhorst E 07/12/2014

## 2014-07-13 NOTE — Progress Notes (Addendum)
      [address]       [place] [address]             [phone]          Subjective: conts to feel better, TEE tomorrow  Objective: Vital signs in last 24 hours: Temp:  [97.5 F (36.4 C)-98.2 F (36.8 C)] 98.2 F (36.8 C) (04/24 0509) Pulse Rate:  [92-109] 92 (04/24 0509) Cardiac Rhythm:  [-] Normal sinus rhythm (04/24 0742) Resp:  [17-18] 18 (04/24 0509) BP: (118-130)/(61-67) 130/65 mmHg (04/24 0509) SpO2:  [96 %-97 %] 96 % (04/24 0509) Weight:  [173 lb 14.4 oz (78.881 kg)] 173 lb 14.4 oz (78.881 kg) (04/23 1219)  Hemodynamic parameters for last 24 hours:    Intake/Output from previous day: 04/23 0701 - 04/24 0700 In: 360 [P.O.:360] Out: -  Intake/Output this shift:    General appearance: alert, cooperative and no distress Heart: regular rate and rhythm Lungs: clear to auscultation bilaterally Abdomen: benign  Lab Results:  Recent Labs  07/11/14 0518 07/12/14 0415  WBC 16.7* 15.1*  HGB 8.1* 7.9*  HCT 24.8* 24.6*  PLT 706* 701*   BMET: No results for input(s): NA, K, CL, CO2, GLUCOSE, BUN, CREATININE, CALCIUM in the last 72 hours.  PT/INR: No results for input(s): LABPROT, INR in the last 72 hours. ABG    Component Value Date/Time   PHART 7.450 06/28/2014 0500   HCO3 23.4 06/28/2014 0500   TCO2 24.5 06/28/2014 0500   ACIDBASEDEF 0.1 06/28/2014 0500   O2SAT 95.6 06/28/2014 0500   CBG (last 3)  No results for input(s): GLUCAP in the last 72 hours.  Meds Scheduled Meds: . amLODipine  2.5 mg Oral Daily  . aspirin EC  81 mg Oral Daily  . ceFEPime (MAXIPIME) IV  1 g Intravenous 3 times per day  . docusate sodium  100 mg Oral BID  . enoxaparin (LOVENOX) injection  40 mg Subcutaneous Q24H  . feeding supplement (ENSURE ENLIVE)  237 mL Oral BID BM  . guaiFENesin  600 mg Oral BID  . lisinopril  20 mg Oral Daily  . omega-3 acid ethyl esters  1 g Oral Daily  . sodium chloride  3 mL Intravenous Q12H  . tamsulosin  0.4 mg Oral Daily     Continuous Infusions: . sodium chloride 20 mL/hr at 07/12/14 1348   PRN Meds:.sodium chloride, acetaminophen **OR** acetaminophen, ALPRAZolam, alum & mag hydroxide-simeth, bisacodyl, magnesium hydroxide, ondansetron **OR** ondansetron (ZOFRAN) IV, oxyCODONE, sodium chloride, zolpidem  Xrays No results found.  Assessment/Plan: Stable on current rx/tx TEE in am per recommendations by ID Check CBC in am  LOS: 4 days    GOLD,WAYNE E 07/13/2014 No vegetations noted on transthoracic echo patient examined and medical record reviewed,agree with above note. Tharon Aquas Trigt III 07/13/2014

## 2014-07-14 ENCOUNTER — Encounter (HOSPITAL_COMMUNITY)
Admission: AD | Disposition: A | Discharge status: Home Health | Payer: Self-pay | Source: Ambulatory Visit | Attending: Cardiothoracic Surgery

## 2014-07-14 ENCOUNTER — Encounter: Payer: Self-pay | Admitting: *Deleted

## 2014-07-14 ENCOUNTER — Encounter (HOSPITAL_COMMUNITY): Payer: Self-pay

## 2014-07-14 ENCOUNTER — Other Ambulatory Visit: Payer: Self-pay | Admitting: Internal Medicine

## 2014-07-14 DIAGNOSIS — I340 Nonrheumatic mitral (valve) insufficiency: Secondary | ICD-10-CM

## 2014-07-14 DIAGNOSIS — R509 Fever, unspecified: Secondary | ICD-10-CM | POA: Insufficient documentation

## 2014-07-14 DIAGNOSIS — J869 Pyothorax without fistula: Secondary | ICD-10-CM | POA: Insufficient documentation

## 2014-07-14 HISTORY — PX: TEE WITHOUT CARDIOVERSION: SHX5443

## 2014-07-14 LAB — CBC WITH DIFFERENTIAL/PLATELET
Basophils Absolute: 0.1 10*3/uL (ref 0.0–0.1)
Basophils Relative: 0 % (ref 0–1)
EOS ABS: 0.2 10*3/uL (ref 0.0–0.7)
EOS PCT: 1 % (ref 0–5)
HCT: 26 % — ABNORMAL LOW (ref 39.0–52.0)
Hemoglobin: 8.4 g/dL — ABNORMAL LOW (ref 13.0–17.0)
LYMPHS PCT: 13 % (ref 12–46)
Lymphs Abs: 2 10*3/uL (ref 0.7–4.0)
MCH: 29.5 pg (ref 26.0–34.0)
MCHC: 32.3 g/dL (ref 30.0–36.0)
MCV: 91.2 fL (ref 78.0–100.0)
Monocytes Absolute: 1.4 10*3/uL — ABNORMAL HIGH (ref 0.1–1.0)
Monocytes Relative: 10 % (ref 3–12)
NEUTROS PCT: 76 % (ref 43–77)
Neutro Abs: 11.4 10*3/uL — ABNORMAL HIGH (ref 1.7–7.7)
Platelets: 647 10*3/uL — ABNORMAL HIGH (ref 150–400)
RBC: 2.85 MIL/uL — AB (ref 4.22–5.81)
RDW: 14.1 % (ref 11.5–15.5)
WBC: 15.1 10*3/uL — ABNORMAL HIGH (ref 4.0–10.5)

## 2014-07-14 SURGERY — ECHOCARDIOGRAM, TRANSESOPHAGEAL
Anesthesia: Moderate Sedation

## 2014-07-14 MED ORDER — MIDAZOLAM HCL 5 MG/ML IJ SOLN
INTRAMUSCULAR | Status: AC
  Filled 2014-07-14: qty 2

## 2014-07-14 MED ORDER — FENTANYL CITRATE (PF) 100 MCG/2ML IJ SOLN
INTRAMUSCULAR | Status: AC
  Filled 2014-07-14: qty 2

## 2014-07-14 MED ORDER — LIDOCAINE VISCOUS 2 % MT SOLN
OROMUCOSAL | Status: AC
  Filled 2014-07-14: qty 15

## 2014-07-14 MED ORDER — DIPHENHYDRAMINE HCL 50 MG/ML IJ SOLN
INTRAMUSCULAR | Status: AC
  Filled 2014-07-14: qty 1

## 2014-07-14 MED ORDER — FENTANYL CITRATE (PF) 100 MCG/2ML IJ SOLN
INTRAMUSCULAR | Status: DC | PRN
  Administered 2014-07-14 (×2): 25 ug via INTRAVENOUS

## 2014-07-14 MED ORDER — LIDOCAINE VISCOUS 2 % MT SOLN
OROMUCOSAL | Status: DC | PRN
Start: 2014-07-14 — End: 2014-07-14
  Administered 2014-07-14: 5 mL via OROMUCOSAL

## 2014-07-14 MED ORDER — SODIUM CHLORIDE 0.9 % IJ SOLN
10.0000 mL | Freq: Two times a day (BID) | INTRAMUSCULAR | Status: DC
  Administered 2014-07-14 – 2014-07-15 (×2): 10 mL

## 2014-07-14 MED ORDER — MIDAZOLAM HCL 10 MG/2ML IJ SOLN
INTRAMUSCULAR | Status: DC | PRN
Start: 2014-07-14 — End: 2014-07-14
  Administered 2014-07-14: 1 mg via INTRAVENOUS
  Administered 2014-07-14 (×2): 2 mg via INTRAVENOUS

## 2014-07-14 MED ORDER — BUTAMBEN-TETRACAINE-BENZOCAINE 2-2-14 % EX AERO
INHALATION_SPRAY | CUTANEOUS | Status: DC | PRN
  Administered 2014-07-14: 2 via TOPICAL

## 2014-07-14 NOTE — Progress Notes (Signed)
Medicare Important Message given? YES  (If response is "NO", the following Medicare IM given date fields will be blank)  Date Medicare IM given: 07/14/14 Medicare IM given by:  Ting Cage  

## 2014-07-14 NOTE — Interval H&P Note (Signed)
History and Physical Interval Note:  07/14/2014 10:39 AM  [name] [name]  has presented today for surgery, with the diagnosis of endocarditis  The various methods of treatment have been discussed with the patient and family. After consideration of risks, benefits and other options for treatment, the patient has consented to  Procedure(s): TRANSESOPHAGEAL ECHOCARDIOGRAM (TEE) (N/A) as a surgical intervention .  The patient's history has been reviewed, patient examined, no change in status, stable for surgery.  I have reviewed the patient's chart and labs.  Questions were answered to the patient's satisfaction.     TURNER,TRACI R

## 2014-07-14 NOTE — Progress Notes (Addendum)
      [name]       [place] [address]             [phone]       Procedure(s) (LRB): TRANSESOPHAGEAL ECHOCARDIOGRAM (TEE) (N/A)  Subjective:  [name] [name] continues to feel better.  He is awaiting TEE this morning.  Objective: Vital signs in last 24 hours: Temp:  [98 F (36.7 C)-98.6 F (37 C)] 98.1 F (36.7 C) (04/25 0458) Pulse Rate:  [91-96] 91 (04/25 0458) Cardiac Rhythm:  [-] Normal sinus rhythm (04/24 1901) Resp:  [17-18] 18 (04/25 0458) BP: (109-129)/(59-68) 129/67 mmHg (04/25 0458) SpO2:  [95 %-99 %] 95 % (04/25 0458)  Intake/Output from previous day: 04/24 0701 - 04/25 0700 In: 480 [P.O.:480] Out: -   General appearance: alert, cooperative and no distress Heart: regular rate and rhythm Lungs: clear to auscultation bilaterally Abdomen: soft, non-tender; bowel sounds normal; no masses,  no organomegaly Wound: clean and dry  Lab Results:  Recent Labs  07/12/14 0415 07/14/14 0550  WBC 15.1* 15.1*  HGB 7.9* 8.4*  HCT 24.6* 26.0*  PLT 701* 647*   BMET: No results for input(s): NA, K, CL, CO2, GLUCOSE, BUN, CREATININE, CALCIUM in the last 72 hours.  PT/INR: No results for input(s): LABPROT, INR in the last 72 hours. ABG    Component Value Date/Time   PHART 7.450 06/28/2014 0500   HCO3 23.4 06/28/2014 0500   TCO2 24.5 06/28/2014 0500   ACIDBASEDEF 0.1 06/28/2014 0500   O2SAT 95.6 06/28/2014 0500   CBG (last 3)  No results for input(s): GLUCAP in the last 72 hours.  Assessment/Plan: S/P Procedure(s) (LRB): TRANSESOPHAGEAL ECHOCARDIOGRAM (TEE) (N/A)  1. H/O Empyema- Leukocytosis stable, remains afebrile 2. ID- on ABX per recommendations, TEE today to R/O Endocarditis 3. Dispo- patient stable, continue current care   LOS: 5 days    Ahmed Prima, ERIN 07/14/2014  Preliminary TEE no endocarditis. Await final recommendations from ID, type and duration of antidotic therapy I have seen and examined [name] [name] and agree with  the above assessment  and plan.  Grace Isaac MD Beeper [phone] Office [phone] 07/14/2014 11:56 AM

## 2014-07-14 NOTE — Progress Notes (Signed)
    [facility]  Date of Admission:  07/09/2014  Antibiotics: cefepime  Subjective: Continues to feel better  Objective: Temp:  [97.9 F (36.6 C)-98.6 F (37 C)] 97.9 F (36.6 C) (04/25 1219) Pulse Rate:  [87-96] 90 (04/25 1250) Resp:  [9-34] 23 (04/25 1250) BP: (98-130)/(38-85) 117/49 mmHg (04/25 1250) SpO2:  [93 %-99 %] 94 % (04/25 1250)  General: awake, alert, nad Skin: no rashes Lungs: CTA B   Lab Results Lab Results  Component Value Date   WBC 15.1* 07/14/2014   HGB 8.4* 07/14/2014   HCT 26.0* 07/14/2014   MCV 91.2 07/14/2014   PLT 647* 07/14/2014    Lab Results  Component Value Date   CREATININE 1.19 07/10/2014   BUN 14 07/10/2014   NA 135 07/10/2014   K 4.8 07/10/2014   CL 99 07/10/2014   CO2 27 07/10/2014    Lab Results  Component Value Date   ALT 60* 06/29/2014   AST 47* 06/29/2014   ALKPHOS 60 06/29/2014   BILITOT 0.4 06/29/2014      Microbiology: Recent Results (from the past 240 hour(s))  Culture, blood (routine x 2)     Status: None (Preliminary result)   Collection Time: 07/09/14  7:43 PM  Result Value Ref Range Status   Specimen Description BLOOD RIGHT ANTECUBITAL  Final   Special Requests   Final    BOTTLES DRAWN AEROBIC AND ANAEROBIC 10CC BLUE 5CC RED   Culture   Final           BLOOD CULTURE RECEIVED NO GROWTH TO DATE CULTURE WILL BE HELD FOR 5 DAYS BEFORE ISSUING A FINAL NEGATIVE REPORT Performed at [facility]    Report Status PENDING  Incomplete  Culture, blood (routine x 2)     Status: None (Preliminary result)   Collection Time: 07/09/14  7:49 PM  Result Value Ref Range Status   Specimen Description BLOOD RIGHT WRIST  Final   Special Requests BOTTLES DRAWN AEROBIC ONLY 10CC  Final   Culture   Final           BLOOD CULTURE RECEIVED NO GROWTH TO DATE CULTURE WILL BE HELD FOR 5 DAYS BEFORE ISSUING A FINAL NEGATIVE REPORT Performed at [facility]    Report Status PENDING  Incomplete     Studies/Results: No results found.  Assessment/Plan:  1) fever - no etiology identified but he continues to get better, responding to cefepime.  TEE noted and thickening on PV but not otherwise listed as a vegetation.  I will continue him with cefepime through next Tuesday and then see him in clinic, likely stopping the antibiotics then.  I will get a CBC and cmp the day before for comparison.   I have ordered a picc line I have ordered case manager to arrange and discussed with nursing My office will call him for the follow up  Scharlene Gloss, [facility] [place] [email or web address] [phone] pager   [phone] cell 07/14/2014, 2:25 PM

## 2014-07-14 NOTE — Interval H&P Note (Signed)
History and Physical Interval Note:  07/14/2014 11:38 AM  [name] [name]  has presented today for surgery, with the diagnosis of endocarditis  The various methods of treatment have been discussed with the patient and family. After consideration of risks, benefits and other options for treatment, the patient has consented to  Procedure(s): TRANSESOPHAGEAL ECHOCARDIOGRAM (TEE) (N/A) as a surgical intervention .  The patient's history has been reviewed, patient examined, no change in status, stable for surgery.  I have reviewed the patient's chart and labs.  Questions were answered to the patient's satisfaction.     TURNER,TRACI R

## 2014-07-14 NOTE — H&P (View-Only) (Signed)
      [address]       [place] [address]             [phone]          Subjective: conts to feel better, TEE tomorrow  Objective: Vital signs in last 24 hours: Temp:  [97.5 F (36.4 C)-98.2 F (36.8 C)] 98.2 F (36.8 C) (04/24 0509) Pulse Rate:  [92-109] 92 (04/24 0509) Cardiac Rhythm:  [-] Normal sinus rhythm (04/24 0742) Resp:  [17-18] 18 (04/24 0509) BP: (118-130)/(61-67) 130/65 mmHg (04/24 0509) SpO2:  [96 %-97 %] 96 % (04/24 0509) Weight:  [173 lb 14.4 oz (78.881 kg)] 173 lb 14.4 oz (78.881 kg) (04/23 1219)  Hemodynamic parameters for last 24 hours:    Intake/Output from previous day: 04/23 0701 - 04/24 0700 In: 360 [P.O.:360] Out: -  Intake/Output this shift:    General appearance: alert, cooperative and no distress Heart: regular rate and rhythm Lungs: clear to auscultation bilaterally Abdomen: benign  Lab Results:  Recent Labs  07/11/14 0518 07/12/14 0415  WBC 16.7* 15.1*  HGB 8.1* 7.9*  HCT 24.8* 24.6*  PLT 706* 701*   BMET: No results for input(s): NA, K, CL, CO2, GLUCOSE, BUN, CREATININE, CALCIUM in the last 72 hours.  PT/INR: No results for input(s): LABPROT, INR in the last 72 hours. ABG    Component Value Date/Time   PHART 7.450 06/28/2014 0500   HCO3 23.4 06/28/2014 0500   TCO2 24.5 06/28/2014 0500   ACIDBASEDEF 0.1 06/28/2014 0500   O2SAT 95.6 06/28/2014 0500   CBG (last 3)  No results for input(s): GLUCAP in the last 72 hours.  Meds Scheduled Meds: . amLODipine  2.5 mg Oral Daily  . aspirin EC  81 mg Oral Daily  . ceFEPime (MAXIPIME) IV  1 g Intravenous 3 times per day  . docusate sodium  100 mg Oral BID  . enoxaparin (LOVENOX) injection  40 mg Subcutaneous Q24H  . feeding supplement (ENSURE ENLIVE)  237 mL Oral BID BM  . guaiFENesin  600 mg Oral BID  . lisinopril  20 mg Oral Daily  . omega-3 acid ethyl esters  1 g Oral Daily  . sodium chloride  3 mL Intravenous Q12H  . tamsulosin  0.4 mg Oral Daily     Continuous Infusions: . sodium chloride 20 mL/hr at 07/12/14 1348   PRN Meds:.sodium chloride, acetaminophen **OR** acetaminophen, ALPRAZolam, alum & mag hydroxide-simeth, bisacodyl, magnesium hydroxide, ondansetron **OR** ondansetron (ZOFRAN) IV, oxyCODONE, sodium chloride, zolpidem  Xrays No results found.  Assessment/Plan: Stable on current rx/tx TEE in am per recommendations by ID Check CBC in am  LOS: 4 days    Chrislyn Seedorf E 07/13/2014 No vegetations noted on transthoracic echo patient examined and medical record reviewed,agree with above note. Tharon Aquas Trigt III 07/13/2014

## 2014-07-14 NOTE — Patient Outreach (Signed)
[facility]) Care Management  Referral received for [place] [facility] Management services. I will collaborate with hospital liaison Marthenia Rolling RN, MSN and follow Mr. Torregrossa progress.  [place] Management  [phone]

## 2014-07-14 NOTE — Progress Notes (Signed)
Peripherally Inserted Central Catheter/Midline Placement  The IV Nurse has discussed with the patient and/or persons authorized to consent for the patient, the purpose of this procedure and the potential benefits and risks involved with this procedure.  The benefits include less needle sticks, lab draws from the catheter and patient may be discharged home with the catheter.  Risks include, but not limited to, infection, bleeding, blood clot (thrombus formation), and puncture of an artery; nerve damage and irregular heat beat.  Alternatives to this procedure were also discussed.  PICC/Midline Placement Documentation  PICC / Midline Single Lumen 65/68/12 PICC Right Basilic 41 cm 0 cm (Active)  Exposed Catheter (cm) 0 cm 07/14/2014  5:21 PM  Dressing Change Due 07/21/14 07/14/2014  5:21 PM       [name] [name], Maricela Bo 07/14/2014, 5:23 PM

## 2014-07-14 NOTE — Progress Notes (Signed)
  Echocardiogram Echocardiogram Transesophageal has been performed.  Philipp Deputy 07/14/2014, 1:14 PM

## 2014-07-14 NOTE — CV Procedure (Signed)
PROCEDURE NOTE  Procedure:  Transesophageal echocardiogram  Operator:  Fransico Him, MD Indications:  Rule out endocarditis Complications: none IV Meds: versed 5mg , Fentanyl 9mcg IV  Results:  Normal LV size and function EF 55% Normal RV size and function Normal RA Normal LA Normal TV with no TR Normal MV with mild MR The PV appears thickened and cannot rule out a mass on the pulmonary artery side of the valve.  There is trivial PR. Normal trileaflet AV with trivial AR Normal interatrial septum with no evidence of flow by colorflow doppler  The patient tolerated the procedure well and was transferred back to his room in stable condition  Signed: Fransico Him, MD [facility] 07/14/2014

## 2014-07-14 NOTE — Care Management Note (Addendum)
    Page 1 of 1   07/15/2014     11:34:26 AM CARE MANAGEMENT NOTE 07/14/2014  Patient:  [name],[name]   Account Number:  [id number]  Date Initiated:  07/10/2014  Documentation initiated by:  Marvetta Gibbons  Subjective/Objective Assessment:   Pt admitted with rt empyema, fevers, elevated WBC (recent VATS)     Action/Plan:   PTA pt lived at home with spouse-   Anticipated DC Date:  07/16/2014   Anticipated DC Plan:  [place]  CM consult      [facility] Choice  HOME HEALTH   Choice offered to / List presented to:  C-3 Spouse        HH arranged  IV Antibiotics  HH-1 RN      [place].   Status of service:  In process, will continue to follow Medicare Important Message given?  YES (If response is "NO", the following Medicare IM given date fields will be blank) Date Medicare IM given:  07/14/2014 Medicare IM given by:  Marvetta Gibbons Date Additional Medicare IM given:   Additional Medicare IM given by:    Discharge Disposition:    Per UR Regulation:  Reviewed for med. necessity/level of care/duration of stay  If discussed at [place] of Stay Meetings, dates discussed:   07/15/2014    Comments:  07/14/14- 1400- Marvetta Gibbons RN, BSN [phone] Referral received for home IV abx- spoke with pt and wife at bedside- choice offered for [facility] agency for home IV abx- per choice they would like to use [facility] for [facility] services for IV abx- referral called to Pam with [facility] for iv abx- pt still awaiting PICC placement- MD please place [facility] -RN orders for IV abx

## 2014-07-14 NOTE — Progress Notes (Signed)
[facility]  Patient Status: New Pt with [facility] this admission  [facility] is providing the following services:   HHRN, [place] for home IV ABX. [facility] hospital team will provide in hospital teaching re:  Set up and administration of IV ABX to support independence at home upon DC.  If patient discharges after hours, please call [phone].   Larry Sierras 07/14/2014, 4:20 PM

## 2014-07-15 ENCOUNTER — Encounter (HOSPITAL_COMMUNITY): Payer: Self-pay | Admitting: Cardiology

## 2014-07-15 ENCOUNTER — Telehealth: Payer: Self-pay | Admitting: *Deleted

## 2014-07-15 DIAGNOSIS — Z452 Encounter for adjustment and management of vascular access device: Secondary | ICD-10-CM | POA: Diagnosis not present

## 2014-07-15 DIAGNOSIS — Z8701 Personal history of pneumonia (recurrent): Secondary | ICD-10-CM | POA: Diagnosis not present

## 2014-07-15 DIAGNOSIS — J869 Pyothorax without fistula: Secondary | ICD-10-CM | POA: Diagnosis not present

## 2014-07-15 DIAGNOSIS — Z5181 Encounter for therapeutic drug level monitoring: Secondary | ICD-10-CM | POA: Diagnosis not present

## 2014-07-15 DIAGNOSIS — R509 Fever, unspecified: Secondary | ICD-10-CM | POA: Diagnosis not present

## 2014-07-15 DIAGNOSIS — I10 Essential (primary) hypertension: Secondary | ICD-10-CM | POA: Diagnosis not present

## 2014-07-15 MED ORDER — DEXTROSE 5 % IV SOLN: 1.0000 g | Freq: Three times a day (TID) | INTRAVENOUS | Status: DC

## 2014-07-15 MED ORDER — ALPRAZOLAM 0.25 MG PO TABS: 0.2500 mg | ORAL_TABLET | Freq: Every evening | ORAL | Status: DC | PRN

## 2014-07-15 MED ORDER — HEPARIN SOD (PORK) LOCK FLUSH 100 UNIT/ML IV SOLN
250.0000 [IU] | INTRAVENOUS | Status: AC | PRN
  Administered 2014-07-15: 250 [IU]

## 2014-07-15 MED ORDER — HEPARIN SOD (PORK) LOCK FLUSH 100 UNIT/ML IV SOLN
250.0000 [IU] | INTRAVENOUS | Status: DC | PRN
Start: 2014-07-15 — End: 2014-07-15

## 2014-07-15 NOTE — Telephone Encounter (Signed)
Called and left patient a voice mail to inform him that Dr. Linus Salmons would like lab work done on him prior to his appt on 07/22/14. Spoke with lab at [facility] and the order is in. He does not need an appt; but they suggest he go between 7 AM and 12 PM. I asked that he call this clinic to let me know that he received the message. The orders for CBC with diff and CMP have been ordered. Myrtis Hopping CMA

## 2014-07-15 NOTE — Progress Notes (Addendum)
      [address]       [place] [address]             [phone]      1 Day Post-Op Procedure(s) (LRB): TRANSESOPHAGEAL ECHOCARDIOGRAM (TEE) (N/A)   Subjective:  Mr Brandenburg has no complaints.  They are anxious to get home.  Objective: Vital signs in last 24 hours: Temp:  [97.9 F (36.6 C)-98.6 F (37 C)] 98.6 F (37 C) (04/26 0513) Pulse Rate:  [87-105] 90 (04/26 0513) Cardiac Rhythm:  [-] Normal sinus rhythm;Sinus tachycardia (04/26 0054) Resp:  [9-34] 20 (04/26 0513) BP: (98-130)/(38-85) 124/62 mmHg (04/26 0513) SpO2:  [93 %-99 %] 96 % (04/26 0513)  Intake/Output from previous day: 04/25 0701 - 04/26 0700 In: 1154 [P.O.:120; I.V.:1034] Out: -   General appearance: alert, cooperative and no distress Heart: regular rate and rhythm Lungs: clear to auscultation bilaterally Abdomen: soft, non-tender; bowel sounds normal; no masses,  no organomegaly  Lab Results:  Recent Labs  07/14/14 0550  WBC 15.1*  HGB 8.4*  HCT 26.0*  PLT 647*   BMET: No results for input(s): NA, K, CL, CO2, GLUCOSE, BUN, CREATININE, CALCIUM in the last 72 hours.  PT/INR: No results for input(s): LABPROT, INR in the last 72 hours. ABG    Component Value Date/Time   PHART 7.450 06/28/2014 0500   HCO3 23.4 06/28/2014 0500   TCO2 24.5 06/28/2014 0500   ACIDBASEDEF 0.1 06/28/2014 0500   O2SAT 95.6 06/28/2014 0500   CBG (last 3)  No results for input(s): GLUCAP in the last 72 hours.  Assessment/Plan: S/P Procedure(s) (LRB): TRANSESOPHAGEAL ECHOCARDIOGRAM (TEE) (N/A)  1. H/O Empyema- readmitted with Fever, TEE negative for Endocarditis yesterday 2. ID- continue ABX per recommendations, H/H is arranged 3. Dispo- will d/c home today  LOS: 6 days    BARRETT, ERIN 07/15/2014  I have seen and examined Rosezetta Schlatter and agree with the above assessment  and plan.  Grace Isaac MD Beeper [phone] Office [phone] 07/15/2014 1:28 PM

## 2014-07-15 NOTE — Progress Notes (Signed)
Pt given discharge instructions medication list, and AVS, pt and wife verbalized understanding of follow up appointments and has paper prescriptions. Will discharge home as ordered. Tavish Gettis, Bettina Gavia RN

## 2014-07-16 ENCOUNTER — Other Ambulatory Visit: Payer: Self-pay | Admitting: *Deleted

## 2014-07-16 LAB — CULTURE, BLOOD (ROUTINE X 2)
CULTURE: NO GROWTH
Culture: NO GROWTH

## 2014-07-16 NOTE — Telephone Encounter (Signed)
Wife returned call.  [facility] will be drawing labs on 07/20/14.  Pt does not need to go to [facility] on Monday.

## 2014-07-16 NOTE — Patient Outreach (Signed)
[facility]) Care Management  07/16/2014  [name] [name] 03-Oct-1945 [id number]  I spoke with [name] [name] by phone today for transition of care assessment. [name] [name] was sleeping. [name] [name] gave consent for engagement with [facility] services on 07/11/14 while still inpatient at [facility] and granted his wife [name] [name] permission to receive and give information about his healthcare concerns.   [name] [name] is receiving home health nursing care through [facility] for management of IV antibiotics and general and pulmonary assessment. I offered to see [name] [name] at home on Friday but [name] [name] declined, stating that [name] [name] wished to have as much quiet time at home as possible right now. [name] [name] expressed her gratitude for my call and asked me to please return a call but did not want visits until [facility] visits taper off. I will honor [name]'s wishes and have scheduled another phone contact.   I contacted [name] [name] office to request that someone call [name] [name] to schedule an afternoon appointment for post hospital follow up. [name] [name] has an appointment with Dr. Servando [name] next week on Tuesday and will have an xray prior to the appointment. In addition, the home health nurse will perform venipuncture on Sunday for labs ordered by Dr. Servando [name].   I will make at least telephonic contact with [name] [name] weekly over the next 3 weeks. When he wishes to allow home visits, I will schedule an appointment.    [place] Management  [phone]

## 2014-07-17 ENCOUNTER — Ambulatory Visit: Payer: Medicare Other | Admitting: Cardiothoracic Surgery

## 2014-07-17 DIAGNOSIS — J869 Pyothorax without fistula: Secondary | ICD-10-CM | POA: Diagnosis not present

## 2014-07-17 DIAGNOSIS — R509 Fever, unspecified: Secondary | ICD-10-CM | POA: Diagnosis not present

## 2014-07-17 DIAGNOSIS — Z452 Encounter for adjustment and management of vascular access device: Secondary | ICD-10-CM | POA: Diagnosis not present

## 2014-07-17 DIAGNOSIS — I10 Essential (primary) hypertension: Secondary | ICD-10-CM | POA: Diagnosis not present

## 2014-07-17 DIAGNOSIS — Z5181 Encounter for therapeutic drug level monitoring: Secondary | ICD-10-CM | POA: Diagnosis not present

## 2014-07-17 DIAGNOSIS — Z8701 Personal history of pneumonia (recurrent): Secondary | ICD-10-CM | POA: Diagnosis not present

## 2014-07-18 ENCOUNTER — Ambulatory Visit: Payer: Medicare Other | Admitting: Cardiothoracic Surgery

## 2014-07-20 DIAGNOSIS — I10 Essential (primary) hypertension: Secondary | ICD-10-CM | POA: Diagnosis not present

## 2014-07-20 DIAGNOSIS — Z8701 Personal history of pneumonia (recurrent): Secondary | ICD-10-CM | POA: Diagnosis not present

## 2014-07-20 DIAGNOSIS — Z452 Encounter for adjustment and management of vascular access device: Secondary | ICD-10-CM | POA: Diagnosis not present

## 2014-07-20 DIAGNOSIS — R509 Fever, unspecified: Secondary | ICD-10-CM | POA: Diagnosis not present

## 2014-07-20 DIAGNOSIS — Z5181 Encounter for therapeutic drug level monitoring: Secondary | ICD-10-CM | POA: Diagnosis not present

## 2014-07-20 DIAGNOSIS — J869 Pyothorax without fistula: Secondary | ICD-10-CM | POA: Diagnosis not present

## 2014-07-21 ENCOUNTER — Ambulatory Visit
Admission: RE | Admit: 2014-07-21 | Discharge: 2014-07-21 | Disposition: A | Discharge status: Home / Self Care | Payer: Medicare Other | Source: Ambulatory Visit | Attending: Cardiothoracic Surgery | Admitting: Cardiothoracic Surgery

## 2014-07-21 ENCOUNTER — Encounter: Payer: Self-pay | Admitting: Cardiothoracic Surgery

## 2014-07-21 ENCOUNTER — Other Ambulatory Visit: Payer: Self-pay | Admitting: Cardiothoracic Surgery

## 2014-07-21 ENCOUNTER — Ambulatory Visit (INDEPENDENT_AMBULATORY_CARE_PROVIDER_SITE_OTHER): Payer: Self-pay | Admitting: Cardiothoracic Surgery

## 2014-07-21 VITALS — BP 110/74 | HR 100 | Resp 20 | Ht 70.0 in | Wt 173.0 lb

## 2014-07-21 DIAGNOSIS — Z8701 Personal history of pneumonia (recurrent): Secondary | ICD-10-CM | POA: Diagnosis not present

## 2014-07-21 DIAGNOSIS — R509 Fever, unspecified: Secondary | ICD-10-CM | POA: Diagnosis not present

## 2014-07-21 DIAGNOSIS — J869 Pyothorax without fistula: Secondary | ICD-10-CM

## 2014-07-21 DIAGNOSIS — J90 Pleural effusion, not elsewhere classified: Secondary | ICD-10-CM | POA: Diagnosis not present

## 2014-07-21 DIAGNOSIS — Z452 Encounter for adjustment and management of vascular access device: Secondary | ICD-10-CM | POA: Diagnosis not present

## 2014-07-21 DIAGNOSIS — Z5181 Encounter for therapeutic drug level monitoring: Secondary | ICD-10-CM | POA: Diagnosis not present

## 2014-07-21 DIAGNOSIS — I10 Essential (primary) hypertension: Secondary | ICD-10-CM | POA: Diagnosis not present

## 2014-07-21 DIAGNOSIS — J929 Pleural plaque without asbestos: Secondary | ICD-10-CM | POA: Diagnosis not present

## 2014-07-21 DIAGNOSIS — J189 Pneumonia, unspecified organism: Secondary | ICD-10-CM | POA: Diagnosis not present

## 2014-07-21 NOTE — Progress Notes (Signed)
[address]       [place] [address]             [phone]      [name] [name] [place] Medical Record #[id number] Date of Birth: 12/06/1945  Referring: Sinda Du, MD Primary Care: Alonza Bogus, MD  Chief Complaint:   POST OP FOLLOW UP 06/27/2014 OPERATIVE REPORT PREOPERATIVE DIAGNOSIS: Postpneumonic empyema with probable sepsis. POSTOPERATIVE DIAGNOSIS: Postpneumonic empyema with probable sepsis. SURGICAL PROCEDURE: Video-assisted bronchoscopy, right video-assisted thoracoscopy, mini thoracotomy, drainage of extensive empyema, and decortication. SURGEON: Lanelle Bal, MD  History of Present Illness:     Patient comes to the office today in follow up after recent drainage of empyema  Culture   Final   ABUNDANT MICROAEROPHILIC STREPTOCOCCI Note: Standardized susceptibility testing for this organism is not available. Performed at [facility]    He still feels weak, he notes some low grade temp 99-100 episodic. The patient is slowly improving increasing his physical activity. He has completed 1 week of home IV antibiotics by the PICC line.     Past Medical History  Diagnosis Date  . Hypertension   . Urinary frequency   . Low serum testosterone level   . Shortness of breath dyspnea   . Pneumonia 05/2014     History  Smoking status  . Former Smoker  . Types: Pipe  . Quit date: 06/07/2014  Smokeless tobacco  . Never Used    History  Alcohol Use  . Yes    Comment: occasionally     No Known Allergies  Current Outpatient Prescriptions  Medication Sig Dispense Refill  . acetaminophen (TYLENOL) 500 MG tablet Take 500 mg by mouth every 8 (eight) hours as needed for mild pain or fever.    . ALPRAZolam (XANAX) 0.25 MG tablet Take 1 tablet (0.25 mg total) by mouth at bedtime as needed for anxiety or sleep. (Patient taking differently: Take 0.25 mg by mouth at bedtime as needed for anxiety or sleep (takes 1/2  tab). ) 10 tablet 0  . amLODipine (NORVASC) 2.5 MG tablet Take 1 tablet by mouth daily.    Marland Kitchen aspirin EC 81 MG tablet Take 81 mg by mouth daily.    . Calcium Citrate-Vitamin D (CALCIUM CITRATE + D3 PO) Take 1 tablet by mouth daily.    Marland Kitchen ceFEPIme 1 g in dextrose 5 % 50 mL Inject 1 g into the vein every 8 (eight) hours. 24 Units 0  . guaiFENesin (MUCINEX) 600 MG 12 hr tablet Take 1 tablet (600 mg total) by mouth 2 (two) times daily.    . Lactobacillus (ACIDOPHILUS PROBIOTIC) 10 MG TABS Take 1 tablet by mouth daily.    Marland Kitchen lisinopril (PRINIVIL,ZESTRIL) 20 MG tablet Take 1 tablet by mouth daily.    . Multiple Vitamins-Minerals (PRESERVISION AREDS 2 PO) Take 2 tablets by mouth daily.    . Omega-3 Fatty Acids (FISH OIL) 1200 MG CAPS Take 1 capsule by mouth daily.    Marland Kitchen PHYTOSTEROLS PO Take 1 tablet by mouth daily.    . SF 5000 PLUS 1.1 % CREA dental cream Place 1 application onto teeth at bedtime.     . tamsulosin (FLOMAX) 0.4 MG CAPS capsule Take 1 capsule by mouth daily.    Marland Kitchen testosterone (ANDROGEL) 50 MG/5GM (1%) GEL Place 5 g onto the skin daily.     No current facility-administered medications for this visit.       Physical Exam: BP 110/74 mmHg  Pulse 100  Resp 20  Ht 5\' 10"  (1.778 m)  Wt 173 lb (78.472 kg)  BMI 24.82 kg/m2  SpO2 95%  General appearance: alert, cooperative, fatigued and no distress Neurologic: intact Heart: regular rate and rhythm, S1, S2 normal, no murmur, click, rub or gallop Lungs: diminished breath sounds LLL Abdomen: soft, non-tender; bowel sounds normal; no masses,  no organomegaly Extremities: extremities normal, atraumatic, no cyanosis or edema and Homans sign is negative, no sign of DVT Wound: wounds and chest ube tract well healed   Diagnostic Studies & Laboratory data:     Recent Radiology Findings:   Dg Chest 2 View  07/21/2014   CLINICAL DATA:  Hand pain.  Pneumonia.  EXAM: CHEST  2 VIEW  COMPARISON:  CT 07/09/2014.  Chest x-ray 07/08/2014.   FINDINGS: Right PICC line in good anatomic position. Stable right pleural effusion/pleural thickening. Left lung is clear. Stable cardiomegaly with normal pulmonary vascularity. No acute bony abnormality.  IMPRESSION: 1. Right PICC line noted with tip at cavoatrial junction. 2. Stable right pleural effusion/pleural thickened. No evidence of progressive pleural fluid collection following empyema drainage. No evidence of pneumothorax.   Electronically Signed   By: Marcello Moores  Register   On: 07/21/2014 13:37      Recent Lab Findings: Lab Results  Component Value Date   WBC 15.1* 07/14/2014   HGB 8.4* 07/14/2014   HCT 26.0* 07/14/2014   PLT 647* 07/14/2014   GLUCOSE 141* 07/10/2014   ALT 60* 06/29/2014   AST 47* 06/29/2014   NA 135 07/10/2014   K 4.8 07/10/2014   CL 99 07/10/2014   CREATININE 1.19 07/10/2014   BUN 14 07/10/2014   CO2 27 07/10/2014   INR 1.17 06/26/2014   Recent Results (from the past 720 hour(s))  Culture, sputum-assessment     Status: None   Collection Time: 06/10/14  7:14 AM  Result Value Ref Range Status   Specimen Description SPUTUM EXPECTORATED  Final   Special Requests NONE  Final   Sputum evaluation   Final    THIS SPECIMEN IS ACCEPTABLE. RESPIRATORY CULTURE REPORT TO FOLLOW. Performed at [facility]    Report Status 06/10/2014 FINAL  Final  Culture, respiratory (NON-Expectorated)     Status: None   Collection Time: 06/10/14  7:14 AM  Result Value Ref Range Status   Specimen Description SPUTUM EXPECTORATED  Final   Special Requests NONE  Final   Gram Stain   Final    MODERATE WBC PRESENT, PREDOMINANTLY PMN MODERATE SQUAMOUS EPITHELIAL CELLS PRESENT ABUNDANT GRAM POSITIVE COCCI IN PAIRS IN CHAINS FEW GRAM NEGATIVE RODS FEW GRAM POSITIVE RODS    Culture   Final    NORMAL OROPHARYNGEAL FLORA Performed at [facility]    Report Status 06/13/2014 FINAL  Final  Blood Culture (routine x 2)     Status: None   Collection Time: 06/26/14  5:10  PM  Result Value Ref Range Status   Specimen Description BLOOD RIGHT ARM  Final   Special Requests BOTTLES DRAWN AEROBIC ONLY 2CC  Final   Culture   Final    NO GROWTH 5 DAYS Performed at [facility]    Report Status 07/02/2014 FINAL  Final  Blood Culture (routine x 2)     Status: None   Collection Time: 06/26/14  5:15 PM  Result Value Ref Range Status   Specimen Description BLOOD LEFT ANTECUBITAL  Final   Special Requests BOTTLES DRAWN AEROBIC AND ANAEROBIC 10CC  Final  Culture   Final    NO GROWTH 5 DAYS Performed at [facility]    Report Status 07/02/2014 FINAL  Final  MRSA PCR Screening     Status: None   Collection Time: 06/26/14  5:59 PM  Result Value Ref Range Status   MRSA by PCR NEGATIVE NEGATIVE Final    Comment:        The GeneXpert MRSA Assay (FDA approved for NASAL specimens only), is one component of a comprehensive MRSA colonization surveillance program. It is not intended to diagnose MRSA infection nor to guide or monitor treatment for MRSA infections.   Urine culture     Status: None   Collection Time: 06/26/14  6:45 PM  Result Value Ref Range Status   Specimen Description URINE, CLEAN CATCH  Final   Special Requests Normal  Final   Colony Count NO GROWTH Performed at [facility]   Final   Culture NO GROWTH Performed at [facility]   Final   Report Status 06/28/2014 FINAL  Final  Fungus Culture with Smear     Status: None (Preliminary result)   Collection Time: 06/27/14  8:01 AM  Result Value Ref Range Status   Specimen Description BRONCHIAL WASHINGS RIGHT  Final   Special Requests NONE  Final   Fungal Smear   Final    NO YEAST OR FUNGAL ELEMENTS SEEN Performed at [facility]    Culture   Final    CULTURE IN PROGRESS FOR FOUR WEEKS Performed at [facility]    Report Status PENDING  Incomplete  Gram stain     Status: None   Collection Time: 06/27/14  8:01 AM  Result Value Ref Range  Status   Specimen Description BRONCHIAL WASHINGS RIGHT  Final   Special Requests NONE  Final   Gram Stain   Final    FEW WBC PRESENT, PREDOMINANTLY PMN NO ORGANISMS SEEN    Report Status 06/27/2014 FINAL  Final  AFB culture with smear     Status: None (Preliminary result)   Collection Time: 06/27/14  8:01 AM  Result Value Ref Range Status   Specimen Description BRONCHIAL WASHINGS RIGHT  Final   Special Requests NONE  Final   Acid Fast Smear   Final    NO ACID FAST BACILLI SEEN Performed at [facility]    Culture   Final    CULTURE WILL BE EXAMINED FOR 6 WEEKS BEFORE ISSUING A FINAL REPORT Performed at [facility]    Report Status PENDING  Incomplete  Culture, respiratory (NON-Expectorated)     Status: None   Collection Time: 06/27/14  8:01 AM  Result Value Ref Range Status   Specimen Description BRONCHIAL WASHINGS RIGHT  Final   Special Requests NONE  Final   Gram Stain   Final    FEW WBC PRESENT, PREDOMINANTLY PMN NO ORGANISMS SEEN Performed at [facility] Performed at [facility]    Culture   Final    NO GROWTH 2 DAYS Performed at [facility]    Report Status 06/29/2014 FINAL  Final  Fungus Culture with Smear     Status: None (Preliminary result)   Collection Time: 06/27/14  8:44 AM  Result Value Ref Range Status   Specimen Description FLUID PLEURAL RIGHT  Final   Special Requests NONE  Final   Fungal Smear   Final    NO YEAST OR FUNGAL ELEMENTS SEEN Performed at [facility]  Culture   Final    CULTURE IN PROGRESS FOR FOUR WEEKS Performed at [facility]    Report Status PENDING  Incomplete  Gram stain     Status: None   Collection Time: 06/27/14  8:44 AM  Result Value Ref Range Status   Specimen Description FLUID PLEURAL RIGHT  Final   Special Requests NONE  Final   Gram Stain   Final    ABUNDANT WBC PRESENT,BOTH PMN AND MONONUCLEAR NO ORGANISMS SEEN    Report Status 06/27/2014 FINAL  Final    Body fluid culture     Status: None   Collection Time: 06/27/14  8:44 AM  Result Value Ref Range Status   Specimen Description FLUID PLEURAL RIGHT  Final   Special Requests NONE  Final   Gram Stain   Final    ABUNDANT WBC PRESENT, PREDOMINANTLY PMN NO ORGANISMS SEEN Performed at [facility]    Culture   Final    NO GROWTH 3 DAYS Performed at [facility]    Report Status 06/30/2014 FINAL  Final  Anaerobic culture     Status: None   Collection Time: 06/27/14  9:08 AM  Result Value Ref Range Status   Specimen Description TISSUE PLEURAL RIGHT  Final   Special Requests NONE  Final   Gram Stain   Final    ABUNDANT WBC PRESENT, PREDOMINANTLY PMN ABUNDANT GRAM POSITIVE COCCI IN PAIRS IN CHAINS Performed at [facility] Performed at [facility]    Culture   Final    NO ANAEROBES ISOLATED Performed at [facility]    Report Status 07/02/2014 FINAL  Final  Tissue culture     Status: None   Collection Time: 06/27/14  9:08 AM  Result Value Ref Range Status   Specimen Description TISSUE PLEURAL RIGHT  Final   Special Requests NONE  Final   Gram Stain   Final    ABUNDANT WBC PRESENT, PREDOMINANTLY PMN ABUNDANT GRAM POSITIVE COCCI IN PAIRS IN CHAINS Performed at [facility] Performed at [facility]    Culture   Final    ABUNDANT MICROAEROPHILIC STREPTOCOCCI Note: Standardized susceptibility testing for this organism is not available. Performed at [facility]    Report Status 06/30/2014 FINAL  Final  AFB culture with smear     Status: None (Preliminary result)   Collection Time: 06/27/14  9:08 AM  Result Value Ref Range Status   Specimen Description TISSUE PLEURAL RIGHT  Final   Special Requests NONE  Final   Acid Fast Smear   Final    NO ACID FAST BACILLI SEEN Performed at [facility]    Culture   Final    CULTURE WILL BE EXAMINED FOR 6 WEEKS BEFORE ISSUING A FINAL REPORT Performed at [facility]    Report Status PENDING  Incomplete  Gram stain     Status: None   Collection Time: 06/27/14  9:08 AM  Result Value Ref Range Status   Specimen Description TISSUE PLEURAL RIGHT  Final   Special Requests NONE  Final   Gram Stain   Final    ABUNDANT WBC PRESENT, PREDOMINANTLY PMN ABUNDANT GRAM POSITIVE COCCI IN PAIRS IN CHAINS    Report Status 06/27/2014 FINAL  Final  Anaerobic culture     Status: None   Collection Time: 06/27/14  9:14 AM  Result Value Ref Range Status   Specimen Description FLUID PLEURAL RIGHT  Final   Special  Requests ON SWAB  Final   Gram Stain   Final    ABUNDANT WBC PRESENT, PREDOMINANTLY PMN ABUNDANT GRAM POSITIVE COCCI IN PAIRS IN CHAINS Performed at [facility]    Culture   Final    NO ANAEROBES ISOLATED Performed at [facility]    Report Status 07/02/2014 FINAL  Final     Assessment / Plan:     Follow up after drainage of left empyema, WBC still elevated but has decreased to 13,000,   Chest xray stable without obvious increasing effusion. Patient is slowly improving on IV home antibiotics, he's to see Dr. Linus Salmons tomorrow to decide about further IV antibiotics at home, I would favor at least another week as he seems to be progressing on the current regimen. I plan to see him back with a follow-up chest x-ray in 2 weeks      Grace Isaac MD      [address] [place] [address] Office [phone]   Beeper [phone]  07/21/2014 3:22 PM

## 2014-07-22 ENCOUNTER — Telehealth: Payer: Self-pay | Admitting: *Deleted

## 2014-07-22 ENCOUNTER — Ambulatory Visit (INDEPENDENT_AMBULATORY_CARE_PROVIDER_SITE_OTHER): Payer: Medicare Other | Admitting: Internal Medicine

## 2014-07-22 ENCOUNTER — Ambulatory Visit: Payer: Self-pay | Admitting: Cardiothoracic Surgery

## 2014-07-22 ENCOUNTER — Encounter: Payer: Self-pay | Admitting: Internal Medicine

## 2014-07-22 VITALS — BP 113/75 | HR 98 | Temp 98.1°F | Ht 72.0 in | Wt 175.0 lb

## 2014-07-22 DIAGNOSIS — I339 Acute and subacute endocarditis, unspecified: Secondary | ICD-10-CM

## 2014-07-22 DIAGNOSIS — I39 Endocarditis and heart valve disorders in diseases classified elsewhere: Secondary | ICD-10-CM

## 2014-07-22 MED ORDER — CEFTRIAXONE SODIUM 2 G IV SOLR: 2.0000 g | Freq: Two times a day (BID) | INTRAVENOUS | Status: DC

## 2014-07-22 NOTE — Telephone Encounter (Signed)
Per Dr. Linus Salmons, verbal order given to Amy at [facility] pharmacy to stop the cefepime and start ceftriaxone 2 gm twice a day for 2 weeks.  Added ESR and CRP to patient's weekly lab draws.  At patient's request, asked that the lab results also be forwarded to Dr. Servando Snare.  Medication list updated. Landis Gandy, RN

## 2014-07-22 NOTE — Progress Notes (Signed)
   Subjective:    Patient ID: [name] [name], male    DOB: [birth year], 69 y.o.   MRN: [id number]  HPI Here for hospital follow up.  He initially was seen in [facility] for [facility] and treated empirically with levaquin.  He initially responded but worsened again with fever, chills and chest pain and CT scan notable for empyema.  Sent to [facility] and seen by Dr. Servando Snare for this and underwent drainage and decortication.  Fluid grew out Microaerophilic Streptococci.  He was treated with IV zosyn and sent out on augmentin.  His WBC had improved but was still elevated at d/c and he continued to have fever, chills, and not feeling well.  He was then re-hospitalized on 4/20 after office visit and I started him empirically on cefepime.  His WBC was 25,000.  He had remained on Augmentin.  After admission, his fevers abated and he continued to feel better.  WBC down to 15.1 at d/c. Blood cultures remained negative but TEE was done and did show a 'thickening'.  I discussed this with Dr. Radford Pax and she did feel IE certainly possible based on this.  He is here today now 2 weeks into his antibiotics and is feeling weak but better.  WBC from home health is 13.5 but ESR is > 120 and CRP is 5.4.  Tolerating the cefepime well.     Review of Systems  Constitutional: Positive for activity change, appetite change and fatigue. Negative for fever and chills.  Respiratory: Negative for shortness of breath.   Cardiovascular: Negative for chest pain, palpitations and leg swelling.  Gastrointestinal: Negative for nausea and diarrhea.  Skin: Negative for rash.  Neurological: Negative for dizziness, light-headedness and headaches.  Hematological: Negative for adenopathy.       Objective:   Physical Exam  Constitutional: He appears well-developed and well-nourished. No distress.  Tired appearing  Eyes: No scleral icterus.  Cardiovascular: Normal rate, regular rhythm and normal heart sounds.   No murmur  heard. Pulmonary/Chest: Effort normal. No respiratory distress. He has no wheezes.  Musculoskeletal: He exhibits no edema.  Lymphadenopathy:    He has no cervical adenopathy.  Skin: No rash noted.          Assessment & Plan:

## 2014-07-22 NOTE — Assessment & Plan Note (Addendum)
I am not entirely clear if he has endocarditis or not but I would call this probable based on the increased WBC, possible vegetation, continued symptoms after stopping IV antibiotics and elevated ESR.  I will shoot for 28 days which is two weeks more and recheck labs before that.  I may continue to complete a full 6 weeks if not completely better by then.  He will see me in 2 weeks.   40 minutes spent with the patient with 20 minutes of review of the plan and counseling.  TEE discussed with Dr. Turner.   

## 2014-07-24 DIAGNOSIS — Z8701 Personal history of pneumonia (recurrent): Secondary | ICD-10-CM | POA: Diagnosis not present

## 2014-07-24 DIAGNOSIS — Z5181 Encounter for therapeutic drug level monitoring: Secondary | ICD-10-CM | POA: Diagnosis not present

## 2014-07-24 DIAGNOSIS — I10 Essential (primary) hypertension: Secondary | ICD-10-CM | POA: Diagnosis not present

## 2014-07-24 DIAGNOSIS — Z452 Encounter for adjustment and management of vascular access device: Secondary | ICD-10-CM | POA: Diagnosis not present

## 2014-07-24 DIAGNOSIS — J869 Pyothorax without fistula: Secondary | ICD-10-CM | POA: Diagnosis not present

## 2014-07-24 DIAGNOSIS — R509 Fever, unspecified: Secondary | ICD-10-CM | POA: Diagnosis not present

## 2014-07-24 LAB — FUNGUS CULTURE W SMEAR
Fungal Smear: NONE SEEN
Fungal Smear: NONE SEEN

## 2014-07-25 DIAGNOSIS — Z8701 Personal history of pneumonia (recurrent): Secondary | ICD-10-CM | POA: Diagnosis not present

## 2014-07-25 DIAGNOSIS — R509 Fever, unspecified: Secondary | ICD-10-CM | POA: Diagnosis not present

## 2014-07-25 DIAGNOSIS — J869 Pyothorax without fistula: Secondary | ICD-10-CM | POA: Diagnosis not present

## 2014-07-25 DIAGNOSIS — I10 Essential (primary) hypertension: Secondary | ICD-10-CM | POA: Diagnosis not present

## 2014-07-25 DIAGNOSIS — Z5181 Encounter for therapeutic drug level monitoring: Secondary | ICD-10-CM | POA: Diagnosis not present

## 2014-07-25 DIAGNOSIS — Z452 Encounter for adjustment and management of vascular access device: Secondary | ICD-10-CM | POA: Diagnosis not present

## 2014-07-27 DIAGNOSIS — I10 Essential (primary) hypertension: Secondary | ICD-10-CM | POA: Diagnosis not present

## 2014-07-27 DIAGNOSIS — Z452 Encounter for adjustment and management of vascular access device: Secondary | ICD-10-CM | POA: Diagnosis not present

## 2014-07-27 DIAGNOSIS — J869 Pyothorax without fistula: Secondary | ICD-10-CM | POA: Diagnosis not present

## 2014-07-27 DIAGNOSIS — Z8701 Personal history of pneumonia (recurrent): Secondary | ICD-10-CM | POA: Diagnosis not present

## 2014-07-27 DIAGNOSIS — R509 Fever, unspecified: Secondary | ICD-10-CM | POA: Diagnosis not present

## 2014-07-27 DIAGNOSIS — Z5181 Encounter for therapeutic drug level monitoring: Secondary | ICD-10-CM | POA: Diagnosis not present

## 2014-07-28 ENCOUNTER — Other Ambulatory Visit: Payer: Self-pay | Admitting: *Deleted

## 2014-07-28 DIAGNOSIS — R509 Fever, unspecified: Secondary | ICD-10-CM | POA: Diagnosis not present

## 2014-07-28 DIAGNOSIS — Z5181 Encounter for therapeutic drug level monitoring: Secondary | ICD-10-CM | POA: Diagnosis not present

## 2014-07-28 DIAGNOSIS — I10 Essential (primary) hypertension: Secondary | ICD-10-CM | POA: Diagnosis not present

## 2014-07-28 DIAGNOSIS — J869 Pyothorax without fistula: Secondary | ICD-10-CM | POA: Diagnosis not present

## 2014-07-28 DIAGNOSIS — Z452 Encounter for adjustment and management of vascular access device: Secondary | ICD-10-CM | POA: Diagnosis not present

## 2014-07-28 DIAGNOSIS — Z8701 Personal history of pneumonia (recurrent): Secondary | ICD-10-CM | POA: Diagnosis not present

## 2014-07-28 NOTE — Patient Outreach (Signed)
[facility]) Care Management   [place]  07/28/2014   [name] [name] 27-Sep-1945 [id number]  I received a call from [name] [name] who reports that [name] [name] is going to all scheduled appointments as directed. However, he still needs to see [name] [name] for hospital follow up and he would like to see if he can have cardiology care transferred to the [facility] office. I contacted [name] [name]' office and spoke with [name] asking her to help with arrangements for follow up. I also called the [facility] office and was told to have [name] [name] call the office for an appointment. I notified [name] [name] and gave her the appropriate number to call.   [name] [name] continues to have home health care weekly through [facility]. I will continue to follow closely.    [facility]  [phone]

## 2014-07-29 DIAGNOSIS — R509 Fever, unspecified: Secondary | ICD-10-CM | POA: Diagnosis not present

## 2014-07-29 DIAGNOSIS — Z8701 Personal history of pneumonia (recurrent): Secondary | ICD-10-CM | POA: Diagnosis not present

## 2014-07-29 DIAGNOSIS — J869 Pyothorax without fistula: Secondary | ICD-10-CM | POA: Diagnosis not present

## 2014-07-29 DIAGNOSIS — Z5181 Encounter for therapeutic drug level monitoring: Secondary | ICD-10-CM | POA: Diagnosis not present

## 2014-07-29 DIAGNOSIS — Z452 Encounter for adjustment and management of vascular access device: Secondary | ICD-10-CM | POA: Diagnosis not present

## 2014-07-29 DIAGNOSIS — I10 Essential (primary) hypertension: Secondary | ICD-10-CM | POA: Diagnosis not present

## 2014-07-31 DIAGNOSIS — Z5181 Encounter for therapeutic drug level monitoring: Secondary | ICD-10-CM | POA: Diagnosis not present

## 2014-07-31 DIAGNOSIS — Z8701 Personal history of pneumonia (recurrent): Secondary | ICD-10-CM | POA: Diagnosis not present

## 2014-07-31 DIAGNOSIS — R509 Fever, unspecified: Secondary | ICD-10-CM | POA: Diagnosis not present

## 2014-07-31 DIAGNOSIS — J869 Pyothorax without fistula: Secondary | ICD-10-CM | POA: Diagnosis not present

## 2014-07-31 DIAGNOSIS — I10 Essential (primary) hypertension: Secondary | ICD-10-CM | POA: Diagnosis not present

## 2014-07-31 DIAGNOSIS — Z452 Encounter for adjustment and management of vascular access device: Secondary | ICD-10-CM | POA: Diagnosis not present

## 2014-08-01 ENCOUNTER — Ambulatory Visit (INDEPENDENT_AMBULATORY_CARE_PROVIDER_SITE_OTHER): Payer: Medicare Other | Admitting: Adult Health

## 2014-08-01 ENCOUNTER — Encounter: Payer: Self-pay | Admitting: Adult Health

## 2014-08-01 VITALS — BP 122/64 | HR 100 | Ht 72.0 in | Wt 184.0 lb

## 2014-08-01 DIAGNOSIS — I10 Essential (primary) hypertension: Secondary | ICD-10-CM

## 2014-08-01 DIAGNOSIS — I330 Acute and subacute infective endocarditis: Secondary | ICD-10-CM

## 2014-08-01 NOTE — Progress Notes (Signed)
Cardiology Office Note   Date:  08/01/2014   ID:  [name] [name], DOB [birth year], MRN [id number]  PCP:  Alonza Bogus, MD  Cardiologist:  To be established? [name] Sims, NP   Chief Complaint  Patient presents with  . Endocarditis      History of Present Illness: [name] [name] is a 69 y.o. male who presents for dyspnea, with history of endocarditis, status post transesophageal echocardiogram, history of empyema, treated by Dr. Lucianne Lei tried.  The patient was evaluated by infectious disease and continued on antibiotics.  He wishes to be established in the [place] office as this is closer to home.  Echocardiogram completed on 07/13/2004 revealed an LVEF of 55 to 60% with normal.  Wall motion.  No evidence of vegetation, no evidence of thrombus, no evidence of PFO.  There was no shunting.  There was a thickening around the pulmonic valve with possible mobile mass on the pulmonary artery side of the valve leaflet.  Cannot rule out vegetation based upon this study of the pulmonic valve.  He comes today with multiple questions.  He has been followed by Dr. Pia [name] and Dr. Linus [name] , post hospitalization.  His continued on IV, and monitored via a PICC line through his upper right arm.  The patient is a retired Sport and exercise psychologist.  They keep meticulous records and are requesting copies. He states he is feeling much better.  He is on a third round of antibiotics.  He stated C. Infectious disease physician, next week.  He denies recurrent chest pain, worsening shortness of breath, dizziness, diarrhea, abdominal pain, or nausea.  He is beginning to increase his activities down and feel his energy returning.    Past Medical History  Diagnosis Date  . Hypertension   . Urinary frequency   . Low serum testosterone level   . Shortness of breath dyspnea   . Pneumonia 05/2014    Past Surgical History  Procedure Laterality Date  . Hernia repair    . Appendectomy    . Video  bronchoscopy N/A 06/27/2014    Procedure: VIDEO BRONCHOSCOPY;  Surgeon: [name] Isaac, MD;  Location: [place];  Service: Thoracic;  Laterality: N/A;  . Video assisted thoracoscopy Right 06/27/2014    Procedure: VIDEO ASSISTED THORACOSCOPY;  Surgeon: [name] Isaac, MD;  Location: [facility];  Service: Thoracic;  Laterality: Right;  . Empyema drainage Right 06/27/2014    Procedure: EMPYEMA DRAINAGE;  Surgeon: [name] Isaac, MD;  Location: [place];  Service: Thoracic;  Laterality: Right;  . Varicose vein surgery    . Tee without cardioversion N/A 07/14/2014    Procedure: TRANSESOPHAGEAL ECHOCARDIOGRAM (TEE);  Surgeon: [name] Margarita, MD;  Location: [facility];  Service: Cardiovascular;  Laterality: N/A;     Current Outpatient Prescriptions  Medication Sig Dispense Refill  . acetaminophen (TYLENOL) 500 MG tablet Take 500 mg by mouth every 8 (eight) hours as needed for mild pain or fever.    . ALPRAZolam (XANAX) 0.25 MG tablet Take 1 tablet (0.25 mg total) by mouth at bedtime as needed for anxiety or sleep. (Patient taking differently: Take 0.25 mg by mouth at bedtime as needed for anxiety or sleep (takes 1/2 tab). ) 10 tablet 0  . amLODipine (NORVASC) 2.5 MG tablet Take 1 tablet by mouth daily.    Marland Kitchen aspirin EC 81 MG tablet Take 81 mg by mouth daily.    . Calcium Citrate-Vitamin D (CALCIUM CITRATE + D3 PO) Take 1 tablet by mouth daily.    Marland Kitchen  cefTRIAXone (ROCEPHIN) 2 G SOLR injection Inject 2 g into the vein 2 (two) times daily. For 14 days 28 each 0  . Docusate Calcium (STOOL SOFTENER PO) Take 1 capsule by mouth daily as needed.    Marland Kitchen guaiFENesin (MUCINEX) 600 MG 12 hr tablet Take 1 tablet (600 mg total) by mouth 2 (two) times daily.    . Lactobacillus (ACIDOPHILUS PROBIOTIC) 10 MG TABS Take 1 tablet by mouth daily.    Marland Kitchen lisinopril (PRINIVIL,ZESTRIL) 20 MG tablet Take 1 tablet by mouth daily.    . Multiple Vitamins-Minerals (PRESERVISION AREDS 2 PO) Take 2 tablets by mouth daily.    . Omega-3 Fatty  Acids (FISH OIL) 1200 MG CAPS Take 1 capsule by mouth daily.    Marland Kitchen PHYTOSTEROLS PO Take 1 tablet by mouth daily.    . SF 5000 PLUS 1.1 % CREA dental cream Place 1 application onto teeth at bedtime.     . tamsulosin (FLOMAX) 0.4 MG CAPS capsule Take 1 capsule by mouth daily.    Marland Kitchen testosterone (ANDROGEL) 50 MG/5GM (1%) GEL Place 5 g onto the skin daily.     No current facility-administered medications for this visit.    Allergies:   Review of patient's allergies indicates no known allergies.    Social History:  The patient  reports that he quit smoking about 7 weeks ago. His smoking use included Pipe. He has never used smokeless tobacco. He reports that he drinks alcohol. He reports that he does not use illicit drugs.   Family History:  The patient's family history includes COPD in his mother; Hypertension in his father and mother; Tuberculosis in his father.    ROS: .   All other systems are reviewed and negative.Unless otherwise mentioned in H&P above.   PHYSICAL EXAM: VS:  There were no vitals taken for this visit. , BMI There is no weight on file to calculate BMI. GEN: Well nourished, well developed, in no acute distress HEENT: normal Neck: no JVD, carotid bruits, or masses Cardiac: RRR; 1/6 systolic murmur, rubs, or gallops,no edema  Respiratory:  clear to auscultation bilaterally, normal work of breathing GI: soft, nontender, nondistended, + BS MS: no deformity or atrophy Skin: warm and dry, no rash Neuro:  Strength and sensation are intact Psych: euthymic mood, full affect   Recent Labs: 06/29/2014: ALT 60* 07/10/2014: BUN 14; Creatinine 1.19; Potassium 4.8; Sodium 135 07/14/2014: Hemoglobin 8.4*; Platelets 647*    Lipid Panel No results found for: CHOL, TRIG, HDL, CHOLHDL, VLDL, LDLCALC, LDLDIRECT    Wt Readings from Last 3 Encounters:  07/22/14 175 lb (79.379 kg)  07/21/14 173 lb (78.472 kg)  07/12/14 173 lb 14.4 oz (78.881 kg)      Other studies  Reviewed: Additional studies/ records that were reviewed today include: TEE, office notes from Dr. Servando [name], and from Dr. Linus [name] Review of the above records demonstrates: no conclusive evidence of endocarditis.  Continues on antibiotic therapy.  ASSESSMENT AND PLAN:  1. Questionable endocarditis: T. E., E., completed on 07/14/2014 demonstrated possible mobile mass on the pulmonary artery side of the pulmonaryvalve leaflet.this was inconclusive.  He had normal ejection fraction at 55-60%.  There was mild mitral regurg.  The patient will be referred back to Dr. Linus [name] for ongoing antibiotic treatment via PICC line.  I would like him to be established with Dr. Harl Bowie.  They are requesting Dr. Harl Bowie.  Will have a followup appointment made in order to establish a relationship with him.  No changes are  made on his medication regimen at this time.  2. Hypertension: Blood pressure is well-controlled currently.  He will continue amlodipine 2.5 mg daily.  No changes are made in medications.  3. Empyema: Followed by Dr. Servando [name].  Status post video thoracostomy and drainage.Current medicines are reviewed at length with the patient today.    Labs/ tests ordered today include: None No orders of the defined types were placed in this encounter.     Disposition:   FU with Dr.Branch in 6 weeks to be established.   08/01/2014 7:47 AM    [facility] [address], [place], [place] [address] Phone: [phone]; Fax: [phone]

## 2014-08-01 NOTE — Patient Instructions (Signed)
Your physician recommends that you schedule a follow-up appointment in: 6 weeks with Dr. Harl Bowie   Your physician recommends that you continue on your current medications as directed. Please refer to the Current Medication list given to you today.  Thank you for choosing [facility]!

## 2014-08-01 NOTE — Progress Notes (Deleted)
Name: [name] [name]    DOB: [birth year]  Age: 69 y.o.  MR#: [id number]       PCP:  Alonza Bogus, MD      Insurance: Payor: MEDICARE / Plan: MEDICARE PART A AND B / Product Type: *No Product type* /   CC:    Chief Complaint  Patient presents with  . Endocarditis    VS Filed Vitals:   08/01/14 1550  BP: 122/64  Pulse: 100  Height: 6' (1.829 m)  Weight: 184 lb (83.462 kg)  SpO2: 96%    Weights Current Weight  08/01/14 184 lb (83.462 kg)  07/22/14 175 lb (79.379 kg)  07/21/14 173 lb (78.472 kg)    Blood Pressure  BP Readings from Last 3 Encounters:  08/01/14 122/64  07/22/14 113/75  07/21/14 110/74     Admit date:  (Not on file) Last encounter with RMR:  Visit date not found   Allergy Review of patient's allergies indicates no known allergies.  Current Outpatient Prescriptions  Medication Sig Dispense Refill  . ALPRAZolam (XANAX) 0.25 MG tablet Take 1 tablet (0.25 mg total) by mouth at bedtime as needed for anxiety or sleep. (Patient taking differently: Take 0.25 mg by mouth at bedtime as needed for anxiety or sleep (takes 1/2 tab). ) 10 tablet 0  . amLODipine (NORVASC) 2.5 MG tablet Take 1 tablet by mouth daily.    Marland Kitchen aspirin EC 81 MG tablet Take 81 mg by mouth daily.    . Calcium Citrate-Vitamin D (CALCIUM CITRATE + D3 PO) Take 1 tablet by mouth daily.    . cefTRIAXone (ROCEPHIN) 2 G SOLR injection Inject 2 g into the vein 2 (two) times daily. For 14 days 28 each 0  . Docusate Calcium (STOOL SOFTENER PO) Take 1 capsule by mouth daily as needed.    Marland Kitchen guaiFENesin (MUCINEX) 600 MG 12 hr tablet Take 1 tablet (600 mg total) by mouth 2 (two) times daily.    . Lactobacillus (ACIDOPHILUS PROBIOTIC) 10 MG TABS Take 1 tablet by mouth daily.    Marland Kitchen lisinopril (PRINIVIL,ZESTRIL) 20 MG tablet Take 1 tablet by mouth daily.    . Multiple Vitamins-Minerals (PRESERVISION AREDS 2 PO) Take 2 tablets by mouth daily.    . Omega-3 Fatty Acids (FISH OIL) 1200 MG CAPS Take 1 capsule by mouth  daily.    Marland Kitchen PHYTOSTEROLS PO Take 1 tablet by mouth daily.    . SF 5000 PLUS 1.1 % CREA dental cream Place 1 application onto teeth at bedtime.     . tamsulosin (FLOMAX) 0.4 MG CAPS capsule Take 1 capsule by mouth daily.    Marland Kitchen testosterone (ANDROGEL) 50 MG/5GM (1%) GEL Place 5 g onto the skin daily.     No current facility-administered medications for this visit.    Discontinued Meds:    Medications Discontinued During This Encounter  Medication Reason  . acetaminophen (TYLENOL) 500 MG tablet Error    Patient Active Problem List   Diagnosis Date Noted  . Acute and subacute infective endocarditis in diseases classified elsewhere 07/22/2014  . Empyema   . Pyrexia   . Fever 07/09/2014  . Empyema of right pleural space 06/26/2014  . Empyema lung 06/26/2014  . SIRS (systemic inflammatory response syndrome) 06/10/2014  . CAP (community acquired pneumonia) 06/07/2014  . Acute respiratory failure with hypoxia 06/07/2014  . Acute renal injury 06/07/2014  . Dehydration 06/07/2014  . Hypertension 06/07/2014  . BPH (benign prostatic hypertrophy) 06/07/2014  . COPD (chronic obstructive pulmonary disease) 06/07/2014  LABS    Component Value Date/Time   NA 135 07/10/2014 0512   NA 135 07/08/2014 1534   NA 135 07/01/2014 0512   K 4.8 07/10/2014 0512   K 5.4* 07/08/2014 1534   K 4.4 07/01/2014 0512   CL 99 07/10/2014 0512   CL 100 07/08/2014 1534   CL 100 07/01/2014 0512   CO2 27 07/10/2014 0512   CO2 25 07/08/2014 1534   CO2 27 07/01/2014 0512   GLUCOSE 141* 07/10/2014 0512   GLUCOSE 93 07/08/2014 1534   GLUCOSE 111* 07/01/2014 0512   BUN 14 07/10/2014 0512   BUN 20 07/08/2014 1534   BUN 19 07/01/2014 0512   CREATININE 1.19 07/10/2014 0512   CREATININE 1.29 07/09/2014 1943   CREATININE 1.10 07/08/2014 1534   CREATININE 1.29 07/01/2014 0512   CALCIUM 8.1* 07/10/2014 0512   CALCIUM 8.2* 07/08/2014 1534   CALCIUM 7.8* 07/01/2014 0512   GFRNONAA 61* 07/10/2014 0512    GFRNONAA 55* 07/09/2014 1943   GFRNONAA 55* 07/01/2014 0512   GFRAA 70* 07/10/2014 0512   GFRAA 64* 07/09/2014 1943   GFRAA 64* 07/01/2014 0512   CMP     Component Value Date/Time   NA 135 07/10/2014 0512   K 4.8 07/10/2014 0512   CL 99 07/10/2014 0512   CO2 27 07/10/2014 0512   GLUCOSE 141* 07/10/2014 0512   BUN 14 07/10/2014 0512   CREATININE 1.19 07/10/2014 0512   CREATININE 1.10 07/08/2014 1534   CALCIUM 8.1* 07/10/2014 0512   PROT 5.5* 06/29/2014 0551   ALBUMIN 1.4* 06/29/2014 0551   AST 47* 06/29/2014 0551   ALT 60* 06/29/2014 0551   ALKPHOS 60 06/29/2014 0551   BILITOT 0.4 06/29/2014 0551   GFRNONAA 61* 07/10/2014 0512   GFRAA 70* 07/10/2014 0512       Component Value Date/Time   WBC 15.1* 07/14/2014 0550   WBC 15.1* 07/12/2014 0415   WBC 16.7* 07/11/2014 0518   HGB 8.4* 07/14/2014 0550   HGB 7.9* 07/12/2014 0415   HGB 8.1* 07/11/2014 0518   HCT 26.0* 07/14/2014 0550   HCT 24.6* 07/12/2014 0415   HCT 24.8* 07/11/2014 0518   MCV 91.2 07/14/2014 0550   MCV 90.1 07/12/2014 0415   MCV 89.9 07/11/2014 0518    Lipid Panel  No results found for: CHOL, TRIG, HDL, CHOLHDL, VLDL, LDLCALC, LDLDIRECT  ABG    Component Value Date/Time   PHART 7.450 06/28/2014 0500   PCO2ART 34.2* 06/28/2014 0500   PO2ART 70.2* 06/28/2014 0500   HCO3 23.4 06/28/2014 0500   TCO2 24.5 06/28/2014 0500   ACIDBASEDEF 0.1 06/28/2014 0500   O2SAT 95.6 06/28/2014 0500     No results found for: TSH BNP (last 3 results) No results for input(s): BNP in the last 8760 hours.  ProBNP (last 3 results) No results for input(s): PROBNP in the last 8760 hours.  Cardiac Panel (last 3 results) No results for input(s): CKTOTAL, CKMB, TROPONINI, RELINDX in the last 72 hours.  Iron/TIBC/Ferritin/ %Sat No results found for: IRON, TIBC, FERRITIN, IRONPCTSAT   EKG Orders placed or performed during the hospital encounter of 06/26/14  . EKG 12-Lead     Prior Assessment and Plan Problem List as  of 08/01/2014      Cardiovascular and Mediastinum   Hypertension   Acute and subacute infective endocarditis in diseases classified elsewhere   Last Assessment & Plan 07/22/2014 Office Visit Edited 07/22/2014  5:11 PM by Thayer Headings, MD    I am not  entirely clear if he has endocarditis or not but I would call this probable based on the increased WBC, possible vegetation, continued symptoms after stopping IV antibiotics and elevated ESR.  I will shoot for 28 days which is two weeks more and recheck labs before that.  I may continue to complete a full 6 weeks if not completely better by then.  He will see me in 2 weeks.   40 minutes spent with the patient with 20 minutes of review of the plan and counseling.  TEE discussed with Dr. Radford Pax.          Respiratory   CAP (community acquired pneumonia)   Acute respiratory failure with hypoxia   COPD (chronic obstructive pulmonary disease)   Empyema of right pleural space   Empyema lung     Genitourinary   Acute renal injury   BPH (benign prostatic hypertrophy)     Other   Dehydration   SIRS (systemic inflammatory response syndrome)   Fever   Empyema   Pyrexia       Imaging: Dg Chest 2 View  07/21/2014   CLINICAL DATA:  Hand pain.  Pneumonia.  EXAM: CHEST  2 VIEW  COMPARISON:  CT 07/09/2014.  Chest x-ray 07/08/2014.  FINDINGS: Right PICC line in good anatomic position. Stable right pleural effusion/pleural thickening. Left lung is clear. Stable cardiomegaly with normal pulmonary vascularity. No acute bony abnormality.  IMPRESSION: 1. Right PICC line noted with tip at cavoatrial junction. 2. Stable right pleural effusion/pleural thickened. No evidence of progressive pleural fluid collection following empyema drainage. No evidence of pneumothorax.   Electronically Signed   By: Marcello Moores  Register   On: 07/21/2014 13:37   Dg Chest 2 View  07/08/2014   CLINICAL DATA:  Thoracotomy.  Fever.  EXAM: CHEST  2 VIEW  COMPARISON:  07/03/2014.  07/02/2014.   06/29/2014 .  FINDINGS: Mediastinum and hilar structures are normal. Heart size stable. Left lung is clear. Right pleural thickening/effusion is present. No acute bony abnormality. Degenerative changes thoracic spine.  IMPRESSION: 1. Stable right pleural thickening/effusion since chest tube removal. No progressive pleural effusion. No pneumothorax. 2. Stable cardiomegaly.   Electronically Signed   By: Marcello Moores  Register   On: 07/08/2014 14:28   Dg Chest 2 View  07/03/2014   CLINICAL DATA:  Cough for 1 month.  Empyema.  Followup exam.  EXAM: CHEST  2 VIEW  COMPARISON:  07/02/2014  FINDINGS: Right-sided chest tube has been removed since the previous day's study. No convincing pneumothorax.  Small pleural effusion and right lung irregular interstitial opacities, most evident at the lung base, are stable.  No left pleural effusion. No left lung consolidation. Left lung is hyperexpanded.  Cardiac silhouette is normal in size. No mediastinal or hilar masses or convincing adenopathy.  IMPRESSION: 1. Status post right chest tube removal since the previous day's study. No convincing pneumothorax. 2. No other change.   Electronically Signed   By: Lajean Manes M.D.   On: 07/03/2014 08:04   Ct Chest Wo Contrast  07/09/2014   CLINICAL DATA:  69 year old male with a history of empyema and cough  EXAM: CT CHEST WITHOUT CONTRAST  TECHNIQUE: Multidetector CT imaging of the chest was performed following the standard protocol without IV contrast.  COMPARISON:  X-ray 07/08/2014, CT chest 06/26/2014  FINDINGS: Chest:  Unremarkable appearance the superficial soft tissues of the chest wall. No axillary adenopathy. No supraclavicular adenopathy.  Unremarkable appearance of the thoracic inlet. Unremarkable thyroid gland.  Heart size  within normal limits. Trace pericardial fluid/ thickening.  Calcifications of left main, left anterior descending, circumflex, right coronary arteries.  Multiple mediastinal lymph nodes, similar in size in  distribution to the comparison, compatible with reactive lymph nodes. Index lymph node in the right peritracheal nodal station measures 15 mm.  Calcifications of the thoracic aorta and branch vessels again noted. No periaortic fluid.  Compared to the prior CT chest there is significantly decreased volume of right pleural fluid, though there is persisting fluid with internal air loculations, status post video-assisted thoracoscopy and empyema drainage.  Improved aeration of right upper lobe, right middle lobe, and right lower lobe.  Linear and ground-glass opacities of the right lobe likely represent element of atelectasis/ scarring.  Residual fluid does not appear to involve the parenchyma of the right lung. No cavitation of the right lung is identified.  Left lung is well aerated with no confluent airspace disease. No left-sided pleural effusion.  Upper abdomen: Small hiatal hernia with otherwise unremarkable appearance of the visualized upper abdomen.  Musculoskeletal:  No displaced fracture.  Mild degenerative changes of the spine.  IMPRESSION: Significantly decreased right-sided pleural fluid status post video-assisted thoracoscopy and empyema drainage. There is residual fluid with internal loculations, potentially postoperative in nature.  Improved aeration of the right lung, with mixed atelectasis/ scarring and/or interstitial edema.  Reactive mediastinal adenopathy.  Mild atherosclerosis with evidence of coronary artery disease.  Additional incidental findings as above.  Signed,  Dulcy Fanny. Earleen Newport, DO  [facility]  [facility]   Electronically Signed   By: Corrie Mckusick D.O.   On: 07/09/2014 21:25

## 2014-08-03 DIAGNOSIS — Z8701 Personal history of pneumonia (recurrent): Secondary | ICD-10-CM | POA: Diagnosis not present

## 2014-08-03 DIAGNOSIS — J869 Pyothorax without fistula: Secondary | ICD-10-CM | POA: Diagnosis not present

## 2014-08-03 DIAGNOSIS — Z452 Encounter for adjustment and management of vascular access device: Secondary | ICD-10-CM | POA: Diagnosis not present

## 2014-08-03 DIAGNOSIS — Z5181 Encounter for therapeutic drug level monitoring: Secondary | ICD-10-CM | POA: Diagnosis not present

## 2014-08-03 DIAGNOSIS — I10 Essential (primary) hypertension: Secondary | ICD-10-CM | POA: Diagnosis not present

## 2014-08-03 DIAGNOSIS — R509 Fever, unspecified: Secondary | ICD-10-CM | POA: Diagnosis not present

## 2014-08-04 ENCOUNTER — Other Ambulatory Visit: Payer: Self-pay | Admitting: Cardiothoracic Surgery

## 2014-08-04 ENCOUNTER — Telehealth: Payer: Self-pay | Admitting: *Deleted

## 2014-08-04 DIAGNOSIS — J869 Pyothorax without fistula: Secondary | ICD-10-CM

## 2014-08-04 NOTE — Telephone Encounter (Addendum)
Weekly labs drawn 08/03/14 did not include creatinine.  BUN elevated to 26.  Requested [facility] check with nursing to find out about creatinine test.  Waiting on response.  Creatinine = 1.12, 08/03/14.  Added to faxed lab work.

## 2014-08-05 ENCOUNTER — Ambulatory Visit
Admission: RE | Admit: 2014-08-05 | Discharge: 2014-08-05 | Disposition: A | Discharge status: Home / Self Care | Payer: Medicare Other | Source: Ambulatory Visit | Attending: Cardiothoracic Surgery | Admitting: Cardiothoracic Surgery

## 2014-08-05 ENCOUNTER — Ambulatory Visit (INDEPENDENT_AMBULATORY_CARE_PROVIDER_SITE_OTHER): Payer: Medicare Other | Admitting: Internal Medicine

## 2014-08-05 ENCOUNTER — Encounter: Payer: Self-pay | Admitting: Internal Medicine

## 2014-08-05 ENCOUNTER — Encounter: Payer: Self-pay | Admitting: Cardiothoracic Surgery

## 2014-08-05 ENCOUNTER — Ambulatory Visit (INDEPENDENT_AMBULATORY_CARE_PROVIDER_SITE_OTHER): Payer: Self-pay | Admitting: Cardiothoracic Surgery

## 2014-08-05 ENCOUNTER — Telehealth: Payer: Self-pay | Admitting: *Deleted

## 2014-08-05 VITALS — BP 125/73 | HR 93 | Temp 97.7°F | Ht 72.0 in | Wt 186.0 lb

## 2014-08-05 VITALS — BP 129/69 | HR 102 | Resp 16 | Ht 72.0 in | Wt 186.0 lb

## 2014-08-05 DIAGNOSIS — I39 Endocarditis and heart valve disorders in diseases classified elsewhere: Secondary | ICD-10-CM

## 2014-08-05 DIAGNOSIS — J869 Pyothorax without fistula: Secondary | ICD-10-CM

## 2014-08-05 DIAGNOSIS — I339 Acute and subacute endocarditis, unspecified: Secondary | ICD-10-CM | POA: Diagnosis present

## 2014-08-05 DIAGNOSIS — R0602 Shortness of breath: Secondary | ICD-10-CM | POA: Diagnosis not present

## 2014-08-05 NOTE — Telephone Encounter (Signed)
Verbal order per Dr. Linus Salmons given to [facility] at [place] to pull patient's picc line. Last dose of IV antibiotics scheduled for this evening. Myrtis Hopping CMA

## 2014-08-05 NOTE — Progress Notes (Signed)
[address]       [place] [address]             [phone]      Garwood Vacha Anderson Medical Record #[id number] Date of Birth: [birth year]  Referring: Sinda Du, MD Primary Care: Alonza Bogus, MD  Chief Complaint:   POST OP FOLLOW UP 06/27/2014 OPERATIVE REPORT PREOPERATIVE DIAGNOSIS: Postpneumonic empyema with probable sepsis. POSTOPERATIVE DIAGNOSIS: Postpneumonic empyema with probable sepsis. SURGICAL PROCEDURE: Video-assisted bronchoscopy, right video-assisted thoracoscopy, mini thoracotomy, drainage of extensive empyema, and decortication. SURGEON: Lanelle Bal, MD  History of Present Illness:     Patient comes to the office today in follow up after recent drainage of empyema  Culture   Final   ABUNDANT MICROAEROPHILIC STREPTOCOCCI Note: Standardized susceptibility testing for this organism is not available. Performed at [facility]    Patient continues to improve, denies fever chills, has been gaining strength appropriately. He is to get his last dose of IV antibiotics today and have the PICC line removed tomorrow per ID.      Past Medical History  Diagnosis Date  . Hypertension   . Urinary frequency   . Low serum testosterone level   . Shortness of breath dyspnea   . Pneumonia 05/2014     History  Smoking status  . Former Smoker  . Types: Pipe  . Quit date: 06/07/2014  Smokeless tobacco  . Never Used    History  Alcohol Use  . 0.0 oz/week  . 0 Standard drinks or equivalent per week    Comment: occasionally     No Known Allergies  Current Outpatient Prescriptions  Medication Sig Dispense Refill  . amLODipine (NORVASC) 2.5 MG tablet Take 1 tablet by mouth daily.    Marland Kitchen aspirin EC 81 MG tablet Take 81 mg by mouth daily.    . Calcium Citrate-Vitamin D (CALCIUM CITRATE + D3 PO) Take 1 tablet by mouth daily.    . cefTRIAXone (ROCEPHIN) 2 G SOLR injection Inject 2 g into the vein 2 (two) times  daily. For 14 days 28 each 0  . Docusate Calcium (STOOL SOFTENER PO) Take 1 capsule by mouth daily as needed.    Marland Kitchen guaiFENesin (MUCINEX) 600 MG 12 hr tablet Take 1 tablet (600 mg total) by mouth 2 (two) times daily.    . Lactobacillus (ACIDOPHILUS PROBIOTIC) 10 MG TABS Take 1 tablet by mouth daily.    Marland Kitchen lisinopril (PRINIVIL,ZESTRIL) 20 MG tablet Take 1 tablet by mouth daily.    . Multiple Vitamins-Minerals (PRESERVISION AREDS 2 PO) Take 2 tablets by mouth daily.    . Omega-3 Fatty Acids (FISH OIL) 1200 MG CAPS Take 1 capsule by mouth daily.    Marland Kitchen PHYTOSTEROLS PO Take 1 tablet by mouth daily.    . SF 5000 PLUS 1.1 % CREA dental cream Place 1 application onto teeth at bedtime.     . tamsulosin (FLOMAX) 0.4 MG CAPS capsule Take 1 capsule by mouth daily.    Marland Kitchen testosterone (ANDROGEL) 50 MG/5GM (1%) GEL Place 5 g onto the skin daily.    Marland Kitchen ALPRAZolam (XANAX) 0.25 MG tablet Take 1 tablet (0.25 mg total) by mouth at bedtime as needed for anxiety or sleep. (Patient not taking: Reported on 08/05/2014) 10 tablet 0   No current facility-administered medications for this visit.       Physical Exam: BP 129/69 mmHg  Pulse 102  Resp 16  Ht 6' (1.829 m)  Wt 186  lb (84.369 kg)  BMI 25.22 kg/m2  SpO2 98%  General appearance: alert, cooperative, fatigued and no distress Neurologic: intact Heart: regular rate and rhythm, S1, S2 normal, no murmur, click, rub or gallop Lungs: diminished breath sounds LLL Abdomen: soft, non-tender; bowel sounds normal; no masses,  no organomegaly Extremities: extremities normal, atraumatic, no cyanosis or edema and Homans sign is negative, no sign of DVT Wound: wounds and chest ube tract well healed   Diagnostic Studies & Laboratory data:     Recent Radiology Findings:   Dg Chest 2 View  08/05/2014   CLINICAL DATA:  History of right lung empyema, shortness of breath, right anterior chest pain, empyema drainage on 06/27/2014  EXAM: CHEST  2 VIEW  COMPARISON:  Chest  x-ray of 07/21/2014 and CT chest of 07/09/2014  FINDINGS: There is little change in pleural and parenchymal opacity primarily at the right lung base in this patient with a recent drainage of empyema on the right. This persistent opacity is most consistent with residual fluid and volume loss with possible scarring. On the lateral view there is opacity overlying the thoracic spine posteriorly. Correlating with CT of the chest this probably represents some fluid within the fissure. The left lung is clear. Right PICC line is unchanged in position with the tip just above the expected SVC -RA junction.  IMPRESSION: 1. Little change in pleural and parenchymal opacity at the right lung base. 2. Right PICC line tip just above the expected SVC -RA junction.   Electronically Signed   By: Ivar Drape M.D.   On: 08/05/2014 13:14      Recent Lab Findings: Lab Results  Component Value Date   WBC 15.1* 07/14/2014   HGB 8.4* 07/14/2014   HCT 26.0* 07/14/2014   PLT 647* 07/14/2014   GLUCOSE 141* 07/10/2014   ALT 60* 06/29/2014   AST 47* 06/29/2014   NA 135 07/10/2014   K 4.8 07/10/2014   CL 99 07/10/2014   CREATININE 1.19 07/10/2014   BUN 14 07/10/2014   CO2 27 07/10/2014   INR 1.17 06/26/2014   Recent Results (from the past 720 hour(s))  Culture, sputum-assessment     Status: None   Collection Time: 06/10/14  7:14 AM  Result Value Ref Range Status   Specimen Description SPUTUM EXPECTORATED  Final   Special Requests NONE  Final   Sputum evaluation   Final    THIS SPECIMEN IS ACCEPTABLE. RESPIRATORY CULTURE REPORT TO FOLLOW. Performed at [facility]    Report Status 06/10/2014 FINAL  Final  Culture, respiratory (NON-Expectorated)     Status: None   Collection Time: 06/10/14  7:14 AM  Result Value Ref Range Status   Specimen Description SPUTUM EXPECTORATED  Final   Special Requests NONE  Final   Gram Stain   Final    MODERATE WBC PRESENT, PREDOMINANTLY PMN MODERATE SQUAMOUS EPITHELIAL  CELLS PRESENT ABUNDANT GRAM POSITIVE COCCI IN PAIRS IN CHAINS FEW GRAM NEGATIVE RODS FEW GRAM POSITIVE RODS    Culture   Final    NORMAL OROPHARYNGEAL FLORA Performed at [facility]    Report Status 06/13/2014 FINAL  Final  Blood Culture (routine x 2)     Status: None   Collection Time: 06/26/14  5:10 PM  Result Value Ref Range Status   Specimen Description BLOOD RIGHT ARM  Final   Special Requests BOTTLES DRAWN AEROBIC ONLY 2CC  Final   Culture   Final    NO GROWTH 5 DAYS Performed  at [facility]    Report Status 07/02/2014 FINAL  Final  Blood Culture (routine x 2)     Status: None   Collection Time: 06/26/14  5:15 PM  Result Value Ref Range Status   Specimen Description BLOOD LEFT ANTECUBITAL  Final   Special Requests BOTTLES DRAWN AEROBIC AND ANAEROBIC 10CC  Final   Culture   Final    NO GROWTH 5 DAYS Performed at [facility]    Report Status 07/02/2014 FINAL  Final  MRSA PCR Screening     Status: None   Collection Time: 06/26/14  5:59 PM  Result Value Ref Range Status   MRSA by PCR NEGATIVE NEGATIVE Final    Comment:        The GeneXpert MRSA Assay (FDA approved for NASAL specimens only), is one component of a comprehensive MRSA colonization surveillance program. It is not intended to diagnose MRSA infection nor to guide or monitor treatment for MRSA infections.   Urine culture     Status: None   Collection Time: 06/26/14  6:45 PM  Result Value Ref Range Status   Specimen Description URINE, CLEAN CATCH  Final   Special Requests Normal  Final   Colony Count NO GROWTH Performed at [facility]   Final   Culture NO GROWTH Performed at [facility]   Final   Report Status 06/28/2014 FINAL  Final  Fungus Culture with Smear     Status: None (Preliminary result)   Collection Time: 06/27/14  8:01 AM  Result Value Ref Range Status   Specimen Description BRONCHIAL WASHINGS RIGHT  Final   Special Requests NONE  Final    Fungal Smear   Final    NO YEAST OR FUNGAL ELEMENTS SEEN Performed at [facility]    Culture   Final    CULTURE IN PROGRESS FOR FOUR WEEKS Performed at [facility]    Report Status PENDING  Incomplete  Gram stain     Status: None   Collection Time: 06/27/14  8:01 AM  Result Value Ref Range Status   Specimen Description BRONCHIAL WASHINGS RIGHT  Final   Special Requests NONE  Final   Gram Stain   Final    FEW WBC PRESENT, PREDOMINANTLY PMN NO ORGANISMS SEEN    Report Status 06/27/2014 FINAL  Final  AFB culture with smear     Status: None (Preliminary result)   Collection Time: 06/27/14  8:01 AM  Result Value Ref Range Status   Specimen Description BRONCHIAL WASHINGS RIGHT  Final   Special Requests NONE  Final   Acid Fast Smear   Final    NO ACID FAST BACILLI SEEN Performed at [facility]    Culture   Final    CULTURE WILL BE EXAMINED FOR 6 WEEKS BEFORE ISSUING A FINAL REPORT Performed at [facility]    Report Status PENDING  Incomplete  Culture, respiratory (NON-Expectorated)     Status: None   Collection Time: 06/27/14  8:01 AM  Result Value Ref Range Status   Specimen Description BRONCHIAL WASHINGS RIGHT  Final   Special Requests NONE  Final   Gram Stain   Final    FEW WBC PRESENT, PREDOMINANTLY PMN NO ORGANISMS SEEN Performed at [facility] Performed at [facility]    Culture   Final    NO GROWTH 2 DAYS Performed at [facility]    Report Status 06/29/2014 FINAL  Final  Fungus Culture  with Smear     Status: None (Preliminary result)   Collection Time: 06/27/14  8:44 AM  Result Value Ref Range Status   Specimen Description FLUID PLEURAL RIGHT  Final   Special Requests NONE  Final   Fungal Smear   Final    NO YEAST OR FUNGAL ELEMENTS SEEN Performed at [facility]    Culture   Final    CULTURE IN PROGRESS FOR FOUR WEEKS Performed at [facility]    Report Status PENDING   Incomplete  Gram stain     Status: None   Collection Time: 06/27/14  8:44 AM  Result Value Ref Range Status   Specimen Description FLUID PLEURAL RIGHT  Final   Special Requests NONE  Final   Gram Stain   Final    ABUNDANT WBC PRESENT,BOTH PMN AND MONONUCLEAR NO ORGANISMS SEEN    Report Status 06/27/2014 FINAL  Final  Body fluid culture     Status: None   Collection Time: 06/27/14  8:44 AM  Result Value Ref Range Status   Specimen Description FLUID PLEURAL RIGHT  Final   Special Requests NONE  Final   Gram Stain   Final    ABUNDANT WBC PRESENT, PREDOMINANTLY PMN NO ORGANISMS SEEN Performed at [facility]    Culture   Final    NO GROWTH 3 DAYS Performed at [facility]    Report Status 06/30/2014 FINAL  Final  Anaerobic culture     Status: None   Collection Time: 06/27/14  9:08 AM  Result Value Ref Range Status   Specimen Description TISSUE PLEURAL RIGHT  Final   Special Requests NONE  Final   Gram Stain   Final    ABUNDANT WBC PRESENT, PREDOMINANTLY PMN ABUNDANT GRAM POSITIVE COCCI IN PAIRS IN CHAINS Performed at [facility] Performed at [facility]    Culture   Final    NO ANAEROBES ISOLATED Performed at [facility]    Report Status 07/02/2014 FINAL  Final  Tissue culture     Status: None   Collection Time: 06/27/14  9:08 AM  Result Value Ref Range Status   Specimen Description TISSUE PLEURAL RIGHT  Final   Special Requests NONE  Final   Gram Stain   Final    ABUNDANT WBC PRESENT, PREDOMINANTLY PMN ABUNDANT GRAM POSITIVE COCCI IN PAIRS IN CHAINS Performed at [facility] Performed at [facility]    Culture   Final    ABUNDANT MICROAEROPHILIC STREPTOCOCCI Note: Standardized susceptibility testing for this organism is not available. Performed at [facility]    Report Status 06/30/2014 FINAL  Final  AFB culture with smear     Status: None (Preliminary result)   Collection Time: 06/27/14  9:08  AM  Result Value Ref Range Status   Specimen Description TISSUE PLEURAL RIGHT  Final   Special Requests NONE  Final   Acid Fast Smear   Final    NO ACID FAST BACILLI SEEN Performed at [facility]    Culture   Final    CULTURE WILL BE EXAMINED FOR 6 WEEKS BEFORE ISSUING A FINAL REPORT Performed at [facility]    Report Status PENDING  Incomplete  Gram stain     Status: None   Collection Time: 06/27/14  9:08 AM  Result Value Ref Range Status   Specimen Description TISSUE PLEURAL RIGHT  Final   Special Requests NONE  Final   Gram Stain  Final    ABUNDANT WBC PRESENT, PREDOMINANTLY PMN ABUNDANT GRAM POSITIVE COCCI IN PAIRS IN CHAINS    Report Status 06/27/2014 FINAL  Final  Anaerobic culture     Status: None   Collection Time: 06/27/14  9:14 AM  Result Value Ref Range Status   Specimen Description FLUID PLEURAL RIGHT  Final   Special Requests ON SWAB  Final   Gram Stain   Final    ABUNDANT WBC PRESENT, PREDOMINANTLY PMN ABUNDANT GRAM POSITIVE COCCI IN PAIRS IN CHAINS Performed at [facility]    Culture   Final    NO ANAEROBES ISOLATED Performed at [facility]    Report Status 07/02/2014 FINAL  Final     Assessment / Plan:     Follow up after drainage of left empyema- now improving  During hospitalization there was question of endocarditis, with some thickening of the pulmonic valve so he was treated with a longer course of IV antibiotics.- This was a soft finding Overall am pleased with his progress currently Plan to see him one month with follow-up chest x-ray     Grace Isaac MD      [address] [place] [address] Office [phone]   Beeper [phone]  08/05/2014 2:48 PM

## 2014-08-05 NOTE — Assessment & Plan Note (Signed)
Labs and exam improved.  Will complete antibiotics today and remove picc line.  Will see how he does and have him follow up in 2-3 weeks off of antibiotics.

## 2014-08-05 NOTE — Progress Notes (Signed)
   Subjective:    Patient ID: [name] [name], male    DOB: 28-Sep-1945, 69 y.o.   MRN: [id number]  HPI Here for hospital follow up.  [facility] was seen in [facility] for [facility] and treated empirically with levaquin.  He initially responded but worsened again with fever, chills and chest pain and CT scan notable for empyema.  Sent to [facility] and seen by Dr. Servando Snare for this and underwent drainage and decortication.  Fluid grew out Microaerophilic Streptococci.  He was treated with IV zosyn and sent out on augmentin.  His WBC had improved but was still elevated at d/c and he continued to have fever, chills, and not feeling well.  He was then re-hospitalized on 4/20 after office visit and I started him empirically on cefepime.  His WBC was 25,000.  He had remained on Augmentin.  After admission, his fevers abated and he continued to feel better.  WBC down to 15.1 at d/c. Blood cultures remained negative but TEE was done and did show a 'thickening'.  I discussed this with Dr. Radford Pax and she did feel IE certainly possible based on this.  WBC was 13.5 and ESR was > 120 and CRP is 5.4.     I then changed him to ceftriaxone to complete the IE course and he will finish today.  WBC now wnl at 8.5, crp and ESR down.  He visibly looks much better and feels much better.    Review of Systems  Constitutional: Negative for fever, chills and activity change.       Still some fatigue but improving  Respiratory: Negative for shortness of breath.   Cardiovascular: Negative for chest pain, palpitations and leg swelling.  Gastrointestinal: Negative for nausea and diarrhea.  Skin: Negative for rash.  Neurological: Negative for dizziness, light-headedness and headaches.  Hematological: Negative for adenopathy.       Objective:   Physical Exam  Constitutional: He appears well-developed and well-nourished. No distress.  Eyes: No scleral icterus.  Cardiovascular: Normal rate, regular rhythm and normal heart sounds.     No murmur heard. Pulmonary/Chest: Effort normal. No respiratory distress. He has no wheezes.  Musculoskeletal: He exhibits no edema.  Skin: No rash noted.          Assessment & Plan:

## 2014-08-06 DIAGNOSIS — I10 Essential (primary) hypertension: Secondary | ICD-10-CM | POA: Diagnosis not present

## 2014-08-06 DIAGNOSIS — Z5181 Encounter for therapeutic drug level monitoring: Secondary | ICD-10-CM | POA: Diagnosis not present

## 2014-08-06 DIAGNOSIS — J869 Pyothorax without fistula: Secondary | ICD-10-CM | POA: Diagnosis not present

## 2014-08-06 DIAGNOSIS — Z8701 Personal history of pneumonia (recurrent): Secondary | ICD-10-CM | POA: Diagnosis not present

## 2014-08-06 DIAGNOSIS — R509 Fever, unspecified: Secondary | ICD-10-CM | POA: Diagnosis not present

## 2014-08-06 DIAGNOSIS — Z452 Encounter for adjustment and management of vascular access device: Secondary | ICD-10-CM | POA: Diagnosis not present

## 2014-08-08 DIAGNOSIS — Z8701 Personal history of pneumonia (recurrent): Secondary | ICD-10-CM | POA: Diagnosis not present

## 2014-08-08 DIAGNOSIS — Z5181 Encounter for therapeutic drug level monitoring: Secondary | ICD-10-CM | POA: Diagnosis not present

## 2014-08-08 DIAGNOSIS — R509 Fever, unspecified: Secondary | ICD-10-CM | POA: Diagnosis not present

## 2014-08-08 DIAGNOSIS — Z452 Encounter for adjustment and management of vascular access device: Secondary | ICD-10-CM | POA: Diagnosis not present

## 2014-08-08 DIAGNOSIS — I10 Essential (primary) hypertension: Secondary | ICD-10-CM | POA: Diagnosis not present

## 2014-08-08 DIAGNOSIS — J869 Pyothorax without fistula: Secondary | ICD-10-CM | POA: Diagnosis not present

## 2014-08-09 LAB — AFB CULTURE WITH SMEAR (NOT AT ARMC)
Acid Fast Smear: NONE SEEN
Acid Fast Smear: NONE SEEN

## 2014-08-11 DIAGNOSIS — Z5181 Encounter for therapeutic drug level monitoring: Secondary | ICD-10-CM | POA: Diagnosis not present

## 2014-08-11 DIAGNOSIS — I10 Essential (primary) hypertension: Secondary | ICD-10-CM | POA: Diagnosis not present

## 2014-08-11 DIAGNOSIS — J869 Pyothorax without fistula: Secondary | ICD-10-CM | POA: Diagnosis not present

## 2014-08-11 DIAGNOSIS — N401 Enlarged prostate with lower urinary tract symptoms: Secondary | ICD-10-CM | POA: Diagnosis not present

## 2014-08-11 DIAGNOSIS — I38 Endocarditis, valve unspecified: Secondary | ICD-10-CM | POA: Diagnosis not present

## 2014-08-11 DIAGNOSIS — Z8701 Personal history of pneumonia (recurrent): Secondary | ICD-10-CM | POA: Diagnosis not present

## 2014-08-11 DIAGNOSIS — J449 Chronic obstructive pulmonary disease, unspecified: Secondary | ICD-10-CM | POA: Diagnosis not present

## 2014-08-11 DIAGNOSIS — R509 Fever, unspecified: Secondary | ICD-10-CM | POA: Diagnosis not present

## 2014-08-11 DIAGNOSIS — Z452 Encounter for adjustment and management of vascular access device: Secondary | ICD-10-CM | POA: Diagnosis not present

## 2014-08-13 DIAGNOSIS — Z452 Encounter for adjustment and management of vascular access device: Secondary | ICD-10-CM | POA: Diagnosis not present

## 2014-08-13 DIAGNOSIS — I10 Essential (primary) hypertension: Secondary | ICD-10-CM | POA: Diagnosis not present

## 2014-08-13 DIAGNOSIS — R509 Fever, unspecified: Secondary | ICD-10-CM | POA: Diagnosis not present

## 2014-08-13 DIAGNOSIS — J869 Pyothorax without fistula: Secondary | ICD-10-CM | POA: Diagnosis not present

## 2014-08-13 DIAGNOSIS — Z5181 Encounter for therapeutic drug level monitoring: Secondary | ICD-10-CM | POA: Diagnosis not present

## 2014-08-13 DIAGNOSIS — Z8701 Personal history of pneumonia (recurrent): Secondary | ICD-10-CM | POA: Diagnosis not present

## 2014-08-15 ENCOUNTER — Other Ambulatory Visit: Payer: Self-pay | Admitting: *Deleted

## 2014-08-19 ENCOUNTER — Telehealth: Payer: Self-pay

## 2014-08-19 DIAGNOSIS — J869 Pyothorax without fistula: Secondary | ICD-10-CM | POA: Diagnosis not present

## 2014-08-19 DIAGNOSIS — R509 Fever, unspecified: Secondary | ICD-10-CM | POA: Diagnosis not present

## 2014-08-19 DIAGNOSIS — Z8701 Personal history of pneumonia (recurrent): Secondary | ICD-10-CM | POA: Diagnosis not present

## 2014-08-19 DIAGNOSIS — Z452 Encounter for adjustment and management of vascular access device: Secondary | ICD-10-CM | POA: Diagnosis not present

## 2014-08-19 DIAGNOSIS — Z5181 Encounter for therapeutic drug level monitoring: Secondary | ICD-10-CM | POA: Diagnosis not present

## 2014-08-19 DIAGNOSIS — I10 Essential (primary) hypertension: Secondary | ICD-10-CM | POA: Diagnosis not present

## 2014-08-19 NOTE — Telephone Encounter (Signed)
Patient is scheduled for labs on 08-20-14 at [place]. He called and there are no orders.  Orders faxed today. Patient contacted.    Laverle Patter, RN

## 2014-08-20 ENCOUNTER — Other Ambulatory Visit (HOSPITAL_COMMUNITY): Payer: Self-pay | Admitting: Pulmonary Disease

## 2014-08-20 DIAGNOSIS — I10 Essential (primary) hypertension: Secondary | ICD-10-CM | POA: Diagnosis not present

## 2014-08-20 DIAGNOSIS — R911 Solitary pulmonary nodule: Secondary | ICD-10-CM

## 2014-08-20 DIAGNOSIS — J869 Pyothorax without fistula: Secondary | ICD-10-CM | POA: Diagnosis not present

## 2014-08-20 DIAGNOSIS — Z8701 Personal history of pneumonia (recurrent): Secondary | ICD-10-CM | POA: Diagnosis not present

## 2014-08-20 DIAGNOSIS — Z5181 Encounter for therapeutic drug level monitoring: Secondary | ICD-10-CM | POA: Diagnosis not present

## 2014-08-20 DIAGNOSIS — Z452 Encounter for adjustment and management of vascular access device: Secondary | ICD-10-CM | POA: Diagnosis not present

## 2014-08-20 DIAGNOSIS — R509 Fever, unspecified: Secondary | ICD-10-CM | POA: Diagnosis not present

## 2014-08-21 DIAGNOSIS — R509 Fever, unspecified: Secondary | ICD-10-CM | POA: Diagnosis not present

## 2014-08-21 DIAGNOSIS — Z8701 Personal history of pneumonia (recurrent): Secondary | ICD-10-CM | POA: Diagnosis not present

## 2014-08-21 DIAGNOSIS — I10 Essential (primary) hypertension: Secondary | ICD-10-CM | POA: Diagnosis not present

## 2014-08-21 DIAGNOSIS — Z452 Encounter for adjustment and management of vascular access device: Secondary | ICD-10-CM | POA: Diagnosis not present

## 2014-08-21 DIAGNOSIS — J869 Pyothorax without fistula: Secondary | ICD-10-CM | POA: Diagnosis not present

## 2014-08-21 DIAGNOSIS — Z5181 Encounter for therapeutic drug level monitoring: Secondary | ICD-10-CM | POA: Diagnosis not present

## 2014-08-22 ENCOUNTER — Other Ambulatory Visit: Payer: Self-pay | Admitting: Internal Medicine

## 2014-08-22 DIAGNOSIS — I339 Acute and subacute endocarditis, unspecified: Secondary | ICD-10-CM | POA: Diagnosis not present

## 2014-08-22 LAB — CBC WITH DIFFERENTIAL/PLATELET
BASOS ABS: 0.1 10*3/uL (ref 0.0–0.1)
Basophils Relative: 1 % (ref 0–1)
EOS PCT: 2 % (ref 0–5)
Eosinophils Absolute: 0.2 10*3/uL (ref 0.0–0.7)
HCT: 37.3 % — ABNORMAL LOW (ref 39.0–52.0)
Hemoglobin: 11.9 g/dL — ABNORMAL LOW (ref 13.0–17.0)
Lymphocytes Relative: 21 % (ref 12–46)
Lymphs Abs: 1.7 10*3/uL (ref 0.7–4.0)
MCH: 28.5 pg (ref 26.0–34.0)
MCHC: 31.9 g/dL (ref 30.0–36.0)
MCV: 89.2 fL (ref 78.0–100.0)
MONOS PCT: 9 % (ref 3–12)
MPV: 9.5 fL (ref 8.6–12.4)
Monocytes Absolute: 0.7 10*3/uL (ref 0.1–1.0)
NEUTROS ABS: 5.4 10*3/uL (ref 1.7–7.7)
NEUTROS PCT: 67 % (ref 43–77)
Platelets: 289 10*3/uL (ref 150–400)
RBC: 4.18 MIL/uL — ABNORMAL LOW (ref 4.22–5.81)
RDW: 16.3 % — ABNORMAL HIGH (ref 11.5–15.5)
WBC: 8 10*3/uL (ref 4.0–10.5)

## 2014-08-22 LAB — COMPLETE METABOLIC PANEL WITH GFR
ALK PHOS: 51 U/L (ref 39–117)
ALT: 12 U/L (ref 0–53)
AST: 14 U/L (ref 0–37)
Albumin: 3.5 g/dL (ref 3.5–5.2)
BUN: 23 mg/dL (ref 6–23)
CHLORIDE: 104 meq/L (ref 96–112)
CO2: 27 mEq/L (ref 19–32)
CREATININE: 1.11 mg/dL (ref 0.50–1.35)
Calcium: 9 mg/dL (ref 8.4–10.5)
GFR, EST NON AFRICAN AMERICAN: 67 mL/min
GFR, Est African American: 78 mL/min
Glucose, Bld: 108 mg/dL — ABNORMAL HIGH (ref 70–99)
Potassium: 4.7 mEq/L (ref 3.5–5.3)
Sodium: 140 mEq/L (ref 135–145)
Total Bilirubin: 0.5 mg/dL (ref 0.2–1.2)
Total Protein: 6.8 g/dL (ref 6.0–8.3)

## 2014-08-22 LAB — C-REACTIVE PROTEIN: CRP: <0.5 mg/dL (ref <0.60)

## 2014-08-23 LAB — SEDIMENTATION RATE: Sed Rate: 27 mm/hr — ABNORMAL HIGH (ref 0–20)

## 2014-08-26 ENCOUNTER — Encounter: Payer: Self-pay | Admitting: Internal Medicine

## 2014-08-26 ENCOUNTER — Ambulatory Visit (INDEPENDENT_AMBULATORY_CARE_PROVIDER_SITE_OTHER): Payer: Medicare Other | Admitting: Internal Medicine

## 2014-08-26 VITALS — BP 106/68 | HR 80 | Temp 98.3°F | Ht 72.0 in | Wt 188.0 lb

## 2014-08-26 DIAGNOSIS — I339 Acute and subacute endocarditis, unspecified: Secondary | ICD-10-CM | POA: Diagnosis present

## 2014-08-26 DIAGNOSIS — I39 Endocarditis and heart valve disorders in diseases classified elsewhere: Secondary | ICD-10-CM

## 2014-08-26 NOTE — Progress Notes (Signed)
   Subjective:    Patient ID: [name] [name], male    DOB: [birth year], 69 y.o.   MRN: [id number]  HPI Here for hospital follow up.  He initially was seen in [facility] for [facility] and treated empirically with levaquin.  He initially responded but worsened again with fever, chills and chest pain and CT scan notable for empyema.  Sent to [facility] and seen by Dr. Servando Snare for this and underwent drainage and decortication.  Fluid grew out Microaerophilic Streptococci.  He was treated with IV zosyn and sent out on augmentin.  His WBC had improved but was still elevated at d/c and he continued to have fever, chills, and not feeling well.  He was then re-hospitalized on 4/20 after office visit and I started him empirically on cefepime.  His WBC was 25,000.  He had remained on Augmentin.  After admission, his fevers abated and he continued to feel better.  WBC down to 15.1 at d/c. Blood cultures remained negative but TEE was done and did show a 'thickening'.  I discussed this with Dr. Radford Pax and she did feel IE certainly possible based on this.  WBC was 13.5 and ESR was > 120 and CRP is 5.4.     I then changed him to ceftriaxone to complete the IE course and he will finish today.  WBC now trended down at 8.5, crp and ESR down.     Comes in today for 2 week follow up off antibiotics.  WBC 8, CRP < 0.5, ESR just 27.  Looks and feels well and pleased with everything.  Increasing his exercise capacity.    Review of Systems  Constitutional: Negative for fever, chills and activity change.  Respiratory: Negative for shortness of breath.   Gastrointestinal: Negative for nausea and diarrhea.  Skin: Negative for rash.  Neurological: Negative for dizziness, light-headedness and headaches.  Hematological: Negative for adenopathy.       Objective:   Physical Exam  Constitutional: He appears well-developed and well-nourished. No distress.  Eyes: No scleral icterus.  Cardiovascular: Normal rate, regular rhythm and  normal heart sounds.   No murmur heard. Pulmonary/Chest: Effort normal. No respiratory distress. He has no wheezes.  Musculoskeletal: He exhibits no edema.  Skin: No rash noted.          Assessment & Plan:

## 2014-08-26 NOTE — Assessment & Plan Note (Signed)
Seems to have fit IE with improvement now on a full IE course. Now well off antibiotics.  Can return PRN.

## 2014-08-28 DIAGNOSIS — Z452 Encounter for adjustment and management of vascular access device: Secondary | ICD-10-CM | POA: Diagnosis not present

## 2014-08-28 DIAGNOSIS — J869 Pyothorax without fistula: Secondary | ICD-10-CM | POA: Diagnosis not present

## 2014-08-28 DIAGNOSIS — I10 Essential (primary) hypertension: Secondary | ICD-10-CM | POA: Diagnosis not present

## 2014-08-28 DIAGNOSIS — R509 Fever, unspecified: Secondary | ICD-10-CM | POA: Diagnosis not present

## 2014-08-28 DIAGNOSIS — Z5181 Encounter for therapeutic drug level monitoring: Secondary | ICD-10-CM | POA: Diagnosis not present

## 2014-08-28 DIAGNOSIS — Z8701 Personal history of pneumonia (recurrent): Secondary | ICD-10-CM | POA: Diagnosis not present

## 2014-09-01 ENCOUNTER — Other Ambulatory Visit: Payer: Self-pay | Admitting: Cardiothoracic Surgery

## 2014-09-01 ENCOUNTER — Telehealth: Payer: Self-pay | Admitting: Adult Health

## 2014-09-01 DIAGNOSIS — J869 Pyothorax without fistula: Secondary | ICD-10-CM

## 2014-09-01 NOTE — Telephone Encounter (Signed)
Per the Patient Feedback Surveys it came to my attention that [name] [name] found erroneous medical information populated in his MyChart (it stated he was a diabetic). I spoke with [name] [name] today and she verified that [name] [name] office made the corrections in his medical records.   I told her if they have any other concerns regarding the medical information that is reflected on their MyCharts to please let our office know and we will help sort that out for them.

## 2014-09-05 ENCOUNTER — Other Ambulatory Visit: Payer: Self-pay | Admitting: *Deleted

## 2014-09-08 ENCOUNTER — Encounter: Payer: Self-pay | Admitting: Cardiothoracic Surgery

## 2014-09-08 ENCOUNTER — Ambulatory Visit (HOSPITAL_COMMUNITY)
Admission: RE | Admit: 2014-09-08 | Discharge: 2014-09-08 | Disposition: A | Discharge status: Home / Self Care | Payer: Medicare Other | Source: Ambulatory Visit | Attending: Pulmonary Disease | Admitting: Pulmonary Disease

## 2014-09-08 ENCOUNTER — Ambulatory Visit (INDEPENDENT_AMBULATORY_CARE_PROVIDER_SITE_OTHER): Payer: Self-pay | Admitting: Cardiothoracic Surgery

## 2014-09-08 VITALS — BP 116/63 | HR 90 | Resp 16 | Ht 72.0 in | Wt 186.0 lb

## 2014-09-08 DIAGNOSIS — J90 Pleural effusion, not elsewhere classified: Secondary | ICD-10-CM | POA: Insufficient documentation

## 2014-09-08 DIAGNOSIS — R911 Solitary pulmonary nodule: Secondary | ICD-10-CM | POA: Diagnosis not present

## 2014-09-08 DIAGNOSIS — J9811 Atelectasis: Secondary | ICD-10-CM | POA: Diagnosis not present

## 2014-09-08 DIAGNOSIS — J869 Pyothorax without fistula: Secondary | ICD-10-CM

## 2014-09-08 MED ORDER — IOHEXOL 300 MG/ML  SOLN
75.0000 mL | Freq: Once | INTRAMUSCULAR | Status: AC | PRN
  Administered 2014-09-08: 75 mL via INTRAVENOUS

## 2014-09-08 NOTE — Progress Notes (Signed)
[address]       [place] [address]             [phone]      Marquail Mocarski [place] Medical Record #[id number] Date of Birth: 12-15-1945  Referring: Sinda Du, MD Primary Care: Alonza Bogus, MD  Chief Complaint:   POST OP FOLLOW UP 06/27/2014 OPERATIVE REPORT PREOPERATIVE DIAGNOSIS: Postpneumonic empyema with probable sepsis. POSTOPERATIVE DIAGNOSIS: Postpneumonic empyema with probable sepsis. SURGICAL PROCEDURE: Video-assisted bronchoscopy, right video-assisted thoracoscopy, mini thoracotomy, drainage of extensive empyema, and decortication. SURGEON: Lanelle Bal, MD  History of Present Illness:     Patient comes to the office today in follow up after recent drainage of empyema  Culture   Final   ABUNDANT MICROAEROPHILIC STREPTOCOCCI Note: Standardized susceptibility testing for this organism is not available. Performed at [facility]    Patient continues to improve, denies fever chills, has been gaining strength appropriately. He has completed his course of antibiotics. He denies any fever or  Chills. Follow up ct of chest done by Dr [name].    Past Medical History  Diagnosis Date  . Hypertension   . Urinary frequency   . Low serum testosterone level   . Shortness of breath dyspnea   . Pneumonia 05/2014     History  Smoking status  . Former Smoker  . Types: Pipe  . Quit date: 06/07/2014  Smokeless tobacco  . Never Used    History  Alcohol Use  . 0.0 oz/week  . 0 Standard drinks or equivalent per week    Comment: occasionally     No Known Allergies  Current Outpatient Prescriptions  Medication Sig Dispense Refill  . amLODipine (NORVASC) 2.5 MG tablet Take 1 tablet by mouth daily.    Marland Kitchen aspirin EC 81 MG tablet Take 81 mg by mouth daily.    . Calcium Citrate-Vitamin D (CALCIUM CITRATE + D3 PO) Take 1 tablet by mouth daily.    Mariane Baumgarten Calcium (STOOL SOFTENER PO) Take 1 capsule by mouth  daily as needed.    . Lactobacillus (ACIDOPHILUS PROBIOTIC) 10 MG TABS Take 1 tablet by mouth daily.    Marland Kitchen lisinopril (PRINIVIL,ZESTRIL) 20 MG tablet Take 1 tablet by mouth daily.    . Multiple Vitamins-Minerals (PRESERVISION AREDS 2 PO) Take 2 tablets by mouth daily.    . Omega-3 Fatty Acids (FISH OIL) 1200 MG CAPS Take 1 capsule by mouth daily.    Marland Kitchen PHYTOSTEROLS PO Take 1 tablet by mouth daily.    . SF 5000 PLUS 1.1 % CREA dental cream Place 1 application onto teeth at bedtime.     . tamsulosin (FLOMAX) 0.4 MG CAPS capsule Take 1 capsule by mouth daily.    Marland Kitchen testosterone (ANDROGEL) 50 MG/5GM (1%) GEL Place 5 g onto the skin daily.     No current facility-administered medications for this visit.       Physical Exam: BP 116/63 mmHg  Pulse 90  Resp 16  Ht 6' (1.829 m)  Wt 186 lb (84.369 kg)  BMI 25.22 kg/m2  SpO2 98%  General appearance: alert, cooperative, fatigued and no distress Neurologic: intact Heart: regular rate and rhythm, S1, S2 normal, no murmur, click, rub or gallop Lungs: diminished breath sounds LLL Abdomen: soft, non-tender; bowel sounds normal; no masses,  no organomegaly Extremities: extremities normal, atraumatic, no cyanosis or edema and Homans sign is negative, no sign of DVT Wound: wounds and chest ube tract well healed  Diagnostic Studies & Laboratory data:     Recent Radiology Findings:   Ct Chest W Contrast  09/08/2014   CLINICAL DATA:  Pulmonary nodules.  Right pleural effusion.  EXAM: CT CHEST WITH CONTRAST  TECHNIQUE: Multidetector CT imaging of the chest was performed during intravenous contrast administration.  CONTRAST:  10mL OMNIPAQUE IOHEXOL 300 MG/ML  SOLN  COMPARISON:  CT scans dated 07/09/2014 and 06/26/2014 and chest x-ray dated 08/05/2014  FINDINGS: There is a 2 mm nodule at the left lung base laterally on image 49 of series 3, unchanged since 06/26/2014.  There has been further decrease in the loculated fluid in the left hemi thorax. No  residual air in the fluid. Decreased adjacent atelectasis in the right lung. Diminished slightly prominent azygos and precarinal adenopathy. The largest lymph node is now only 6 mm in diameter.  Chronic prominence of the main pulmonary artery. Diffuse emphysematous disease. No acute osseous abnormality. The visualized portion of the upper abdomen is stable with a cyst in the upper pole of the left kidney and slight adrenal hyperplasia bilaterally.  Heart size is normal.  Scattered coronary artery calcifications.  IMPRESSION: 1. Further decrease in the loculated right pleural effusion and right lung atelectasis. 2. Stable 2 mm nodule at the left lung base. If the patient is at high risk for bronchogenic carcinoma, follow-up chest CT at 1 year is recommended. If the patient is at low risk, no follow-up is needed. This recommendation follows the consensus statement: Guidelines for Management of Small Pulmonary Nodules Detected on CT Scans: A Statement from the [place] as published in Radiology 2005; 237:395-400.   Electronically Signed   By: Lorriane Shire M.D.   On: 09/08/2014 13:18      Recent Lab Findings: Lab Results  Component Value Date   WBC 8.0 08/22/2014   HGB 11.9* 08/22/2014   HCT 37.3* 08/22/2014   PLT 289 08/22/2014   GLUCOSE 108* 08/22/2014   ALT 12 08/22/2014   AST 14 08/22/2014   NA 140 08/22/2014   K 4.7 08/22/2014   CL 104 08/22/2014   CREATININE 1.11 08/22/2014   BUN 23 08/22/2014   CO2 27 08/22/2014   INR 1.17 06/26/2014     Assessment / Plan:    Follow up after drainage of left empyema- now improving on follow CT of Chest  During hospitalization there was question of endocarditis, with some thickening of the pulmonic valve so he was treated with a longer course of IV antibiotics.- This was a soft finding Overall am pleased with his progress currently With his history of smoking and risk of lung CA discussed with him getting follow up ct of chest in one year ,  He will discuss this with Dr [name] and arrange to have in [place]. I will as back as needed.     Grace Isaac MD      [address] [place] [address] Office [phone]   Beeper [phone]  09/08/2014 9:49 PM

## 2014-09-16 ENCOUNTER — Other Ambulatory Visit: Payer: Self-pay | Admitting: *Deleted

## 2014-09-16 NOTE — Patient Outreach (Signed)
[place] [facility]) Care Management  09/16/2014  Aimee Timmons [birth year] [id number]   I have been unable to reach Mr. Gardella by phone on several outreach attempts. I see from review of notes in EPIC that he is actively being followed by [facility] and primary care for treatment of empyema. I will send a letter to Mr. Barna letting him know that [place] Management will close his case if he does not feel he will benefit from our services at this time. If I do not hear from him in 10 days, I will close the case. However, if he wishes to engage in services now or in the future, I will gladly speak with him and provide services as appropriate.    [place] Management  [phone]

## 2014-09-17 ENCOUNTER — Encounter: Payer: Self-pay | Admitting: Cardiology

## 2014-09-17 ENCOUNTER — Ambulatory Visit (INDEPENDENT_AMBULATORY_CARE_PROVIDER_SITE_OTHER): Payer: Medicare Other | Admitting: Cardiology

## 2014-09-17 VITALS — BP 132/78 | HR 76 | Ht 72.0 in | Wt 192.8 lb

## 2014-09-17 DIAGNOSIS — I330 Acute and subacute infective endocarditis: Secondary | ICD-10-CM | POA: Diagnosis not present

## 2014-09-17 NOTE — Progress Notes (Signed)
Clinical Summary [name] [name] is a 69 y.o.male last seen by NP Purcell Nails, this is our first visit together. He is seen for the following medical problems.  1. Hx of endocarditis - 06/2014 TTE LVEF 03-70%, grade I diastolic dysfunction - 06/8887 TEE thickening of PV with possible bobile mass, no significant regurgitation, cannot rule out vegetation - patient also with history of empyema, drained by CT surgery 06/2014.  - abx managed by ID, has completed course. Last visit 08/26/14 noted to have improved clinically, now off abx.   - denies any fevers or chills. Notes some SOB at times, however is improving.  Past Medical History  Diagnosis Date  . Hypertension   . Urinary frequency   . Low serum testosterone level   . Shortness of breath dyspnea   . Pneumonia 05/2014     No Known Allergies   Current Outpatient Prescriptions  Medication Sig Dispense Refill  . amLODipine (NORVASC) 2.5 MG tablet Take 1 tablet by mouth daily.    Marland Kitchen aspirin EC 81 MG tablet Take 81 mg by mouth daily.    . Calcium Citrate-Vitamin D (CALCIUM CITRATE + D3 PO) Take 1 tablet by mouth daily.    Mariane Baumgarten Calcium (STOOL SOFTENER PO) Take 1 capsule by mouth daily as needed.    . Lactobacillus (ACIDOPHILUS PROBIOTIC) 10 MG TABS Take 1 tablet by mouth daily.    Marland Kitchen lisinopril (PRINIVIL,ZESTRIL) 20 MG tablet Take 1 tablet by mouth daily.    . Multiple Vitamins-Minerals (PRESERVISION AREDS 2 PO) Take 2 tablets by mouth daily.    . Omega-3 Fatty Acids (FISH OIL) 1200 MG CAPS Take 1 capsule by mouth daily.    Marland Kitchen PHYTOSTEROLS PO Take 1 tablet by mouth daily.    . SF 5000 PLUS 1.1 % CREA dental cream Place 1 application onto teeth at bedtime.     . tamsulosin (FLOMAX) 0.4 MG CAPS capsule Take 1 capsule by mouth daily.    Marland Kitchen testosterone (ANDROGEL) 50 MG/5GM (1%) GEL Place 5 g onto the skin daily.     No current facility-administered medications for this visit.     Past Surgical History  Procedure Laterality Date  .  Hernia repair    . Appendectomy    . Video bronchoscopy N/A 06/27/2014    Procedure: VIDEO BRONCHOSCOPY;  Surgeon: Grace Isaac, MD;  Location: [facility];  Service: Thoracic;  Laterality: N/A;  . Video assisted thoracoscopy Right 06/27/2014    Procedure: VIDEO ASSISTED THORACOSCOPY;  Surgeon: Grace Isaac, MD;  Location: [facility];  Service: Thoracic;  Laterality: Right;  . Empyema drainage Right 06/27/2014    Procedure: EMPYEMA DRAINAGE;  Surgeon: Grace Isaac, MD;  Location: [facility];  Service: Thoracic;  Laterality: Right;  . Varicose vein surgery    . Tee without cardioversion N/A 07/14/2014    Procedure: TRANSESOPHAGEAL ECHOCARDIOGRAM (TEE);  Surgeon: Sueanne Margarita, MD;  Location: [facility] ENDOSCOPY;  Service: Cardiovascular;  Laterality: N/A;     No Known Allergies    Family History  Problem Relation Age of Onset  . Tuberculosis Father   . Hypertension Father   . COPD Mother   . Hypertension Mother      Social History [name] [name] reports that he quit smoking about 3 months ago. His smoking use included Pipe. He has never used smokeless tobacco. [name] [name] reports that he drinks alcohol.   Review of Systems CONSTITUTIONAL: No weight loss, fever, chills, weakness or fatigue.  HEENT: Eyes:  No visual loss, blurred vision, double vision or yellow sclerae.No hearing loss, sneezing, congestion, runny nose or sore throat.  SKIN: No rash or itching.  CARDIOVASCULAR: per HPI RESPIRATORY: No shortness of breath, cough or sputum.  GASTROINTESTINAL: No anorexia, nausea, vomiting or diarrhea. No abdominal pain or blood.  GENITOURINARY: No burning on urination, no polyuria NEUROLOGICAL: No headache, dizziness, syncope, paralysis, ataxia, numbness or tingling in the extremities. No change in bowel or bladder control.  MUSCULOSKELETAL: No muscle, back pain, joint pain or stiffness.  LYMPHATICS: No enlarged nodes. No history of splenectomy.  PSYCHIATRIC: No history of depression or anxiety.    ENDOCRINOLOGIC: No reports of sweating, cold or heat intolerance. No polyuria or polydipsia.  Marland Kitchen   Physical Examination Filed Vitals:   09/17/14 1300  BP: 132/78  Pulse: 76   Filed Vitals:   09/17/14 1300  Height: 6' (1.829 m)  Weight: 192 lb 12.8 oz (87.454 kg)    Gen: resting comfortably, no acute distress HEENT: no scleral icterus, pupils equal round and reactive, no palptable cervical adenopathy,  CV: RRR, 2/6 systolic murmur RUSB, no JVD Resp: Clear to auscultation bilaterally GI: abdomen is soft, non-tender, non-distended, normal bowel sounds, no hepatosplenomegaly MSK: extremities are warm, no edema.  Skin: warm, no rash Neuro:  no focal deficits Psych: appropriate affect   Diagnostic Studies 06/2014 Echo Study Conclusions  - Left ventricle: There was moderate concentric hypertrophy. Systolic function was normal. The estimated ejection fraction was in the range of 55% to 60%. Doppler parameters are consistent with abnormal left ventricular relaxation (grade 1 diastolic dysfunction). - Mitral valve: Mildly calcified annulus.   06/2014 TEE Study Conclusions  - Left ventricle: The cavity size was normal. Wall thickness was normal. Systolic function was normal. The estimated ejection fraction was in the range of 55% to 60%. - Aortic valve: No evidence of vegetation. There was trivial regurgitation. - Mitral valve: No evidence of vegetation. There was mild regurgitation. - Left atrium: No evidence of thrombus in the atrial cavity or appendage. - Right atrium: No evidence of thrombus in the atrial cavity or appendage. - Atrial septum: No defect or patent foramen ovale was identified. Echo contrast study showed no right-to-left atrial level shunt, following an increase in RA pressure induced by provocative maneuvers. - Tricuspid valve: No evidence of vegetation. - Pulmonic valve: There is thickening of the PV with possible mobile mass on  the pulmonary artery side of the valve leaflet. The valve is not well visuallized and there is no PR. Cannot rule out vegetation based on this study.   Assessment and Plan  1. Hx of endocarditis - questoinable vegetation of pulmonic valve in setting of empyema, s/p empyema drainage and course of abx by ID. Symptoms have resolved, WBC has resolved. No current symptoms - no further cardiac testing indicated. May follow up as needed       Arnoldo Lenis, M.D.

## 2014-09-17 NOTE — Patient Instructions (Signed)
Your physician recommends that you schedule a follow-up appointment in: AS NEEDED WITH DR. [place]  Your physician recommends that you continue on your current medications as directed. Please refer to the Current Medication list given to you today.   Thanks for choosing [facility]!!!

## 2014-09-29 ENCOUNTER — Other Ambulatory Visit: Payer: Self-pay | Admitting: *Deleted

## 2014-09-29 NOTE — Patient Outreach (Signed)
[place] [facility]) Care Management  09/29/2014  Eulice Rutledge Nov 03, 1945 [id number]   I have been unable to reach Mr. Fassnacht by phone and have not heard a response from a letter sent offering continued [facility] Management services. I will close Mr. Donaghey case today. I am happy to speak with or see Mr. Grivas again at any time in the future should we at [facility] Management be able to assist with any case management needs.    [place] Management  [phone]

## 2014-10-02 ENCOUNTER — Encounter: Payer: Self-pay | Admitting: Internal Medicine

## 2014-10-03 NOTE — Patient Outreach (Signed)
[place] [facility]) Care Management  10/03/2014  [name] [name] 08-Jun-1945 [id number]   Notification from Janalyn Shy, RN to close case as unable to maintain contact.  [name]. [place], [place] Management Belt Assistant Phone: [phone] Fax: [phone]

## 2014-10-30 DIAGNOSIS — J449 Chronic obstructive pulmonary disease, unspecified: Secondary | ICD-10-CM | POA: Diagnosis not present

## 2014-10-30 DIAGNOSIS — I10 Essential (primary) hypertension: Secondary | ICD-10-CM | POA: Diagnosis not present

## 2014-10-30 DIAGNOSIS — J869 Pyothorax without fistula: Secondary | ICD-10-CM | POA: Diagnosis not present

## 2014-10-30 DIAGNOSIS — E291 Testicular hypofunction: Secondary | ICD-10-CM | POA: Diagnosis not present

## 2014-10-30 NOTE — Patient Outreach (Signed)
[place] [facility]) Care Management  10/30/2014  Boubacar Lerette 05/01/1945 [id number]   Request from patient to be active with [facility] Management again.  Patient wishes to be contacted via telephone for case management, assigned Mariann Laster, RN to outreach.  Thanks, [name]. [place], [place] Assistant Phone: [phone] Fax: [phone]

## 2014-11-05 ENCOUNTER — Other Ambulatory Visit: Payer: Self-pay

## 2014-11-05 DIAGNOSIS — J449 Chronic obstructive pulmonary disease, unspecified: Secondary | ICD-10-CM

## 2014-11-05 NOTE — Patient Outreach (Addendum)
[facility]) Care Management  11/05/2014  [name] [name] 1945/07/18 [id number]  Telephonic Care Management Note:   Referral Date: 10/30/2014 Referral Source:  Patient Referral Issue:  Previously active with [facility] services and was discharged for not responding.  Patient states he was out of town but wants to engage via telephone.   Triage outreach call #1.  Patient reached and states he would like to connect to [facility] services.  Patient denies any new hospital admissions since April 2016. Admissions:  (3); 06/07/14 [facility] 06/26/14 and 07/10/14   ED Visits: yes PCP:   Dr. [name] -    Last appt  07/2014 estimated.   Social: Lives in his home with his wife.  Mobility: Ambulating without any difficulty; requires no assistive devices.  Caregiver: Wife Falls: None  Transportation: yes - wife and patient are still driving  DME: walker, W/C,  THN Conditions: HTN, COPD  Pain: None other than "just what comes with being 70YO."  Medications : Less than 10 Taking as Prescribed: yes  Refills on time:  Yes Co-pay cost:  Affordable with no donut hole in current plan.   Consent: Patient consents to [facility] services and agrees to Health Coach Level 2 services for COPD Education.   Plan:  RN CM sent referral to Health Coach - COPD -patient request contacting him at home # but if no response within 2 days to call him on his cell #.  Patient plans to take a trip and states he can be reached via cell # during that time.  Phone #'s verified:  [phone] home and [phone] cell  RN CM notified THN: agreed to services - case opened.   Mariann Laster, RN, BSN, [facility], CCM  [place] [employer] Management Coordinator [phone] Direct [phone] Cell [phone] Office [phone] Fax

## 2014-11-05 NOTE — Patient Outreach (Signed)
[place] [facility]) Care Management  11/05/2014  [name] [name] January 19, 1946 [id number]   Request from Mariann Laster, RN to assign [name], assigned [name], RN.  Thanks, [name]. [place], [place] Assistant Phone: [phone] Fax: [phone]

## 2014-11-10 ENCOUNTER — Other Ambulatory Visit: Payer: Self-pay

## 2014-11-10 NOTE — Patient Outreach (Signed)
[place] [facility]) Care Management  11/10/2014  [name] [name] 07-May-1945 [id number]   Telephone call to patient for initial health coach outreach.  Male answers and states that patient is not at home.  HIPAA compliant message left with her and advised that health coach would call another time.    Plan: RN Health Coach will attempt patient within 1-2 weeks.     [name], RN, MSN [place] [phone]

## 2014-11-13 DIAGNOSIS — Z23 Encounter for immunization: Secondary | ICD-10-CM | POA: Diagnosis not present

## 2014-11-14 ENCOUNTER — Other Ambulatory Visit: Payer: Self-pay

## 2014-11-14 NOTE — Patient Outreach (Signed)
[place]) Care Management  11/14/2014  [name] [name] June 24, 1945 [id number]   Telephone call to patient for initial health coach outreach.  No answer.  HIPAA compliant voice message left.    Plan: RN Health Coach will attempt patient within 1-2 weeks.  [name], RN, MSN [place] [phone]

## 2014-11-17 ENCOUNTER — Other Ambulatory Visit: Payer: Self-pay

## 2014-11-17 NOTE — Patient Outreach (Signed)
[place] [facility]) Care Management  [place]  11/17/2014   [name] [name] 12-15-1945 [id number]  Returned phone call to patient after calling in for initial health coach outreach.  Patient reports he is doing good and has not had problems since he was admitted earlier this year but knows he has COPD and has made some lifestyle changes.  Patient no longer smoking and exercises regularly. Patient admits to some lack of knowledge about his COPD.  Discussed with patient COPD and action plan.  Reviewed green zone with patient.  He verbalized understanding. Patient reports primary doctor pleased with his recovery as well.  Patient voices no questions or concerns at this time.  Patient reports he and his wife have trips planned this month.  The first one is Sept 6th-12th and the other is Sept 23rd to Oct 16th.  Advised on health coach calls and will call within those dates and patient agrees.    Current Medications:  Current Outpatient Prescriptions  Medication Sig Dispense Refill  . ALPRAZolam (XANAX) 0.25 MG tablet Take 0.25 mg by mouth at bedtime as needed for anxiety.    Marland Kitchen amLODipine (NORVASC) 2.5 MG tablet Take 1 tablet by mouth daily.    Marland Kitchen aspirin EC 81 MG tablet Take 81 mg by mouth daily.    . Calcium Citrate-Vitamin D (CALCIUM CITRATE + D3 PO) Take 1 tablet by mouth daily.    Mariane Baumgarten Calcium (STOOL SOFTENER PO) Take 1 capsule by mouth daily as needed.    . Lactobacillus (ACIDOPHILUS PROBIOTIC) 10 MG TABS Take 1 tablet by mouth daily.    Marland Kitchen lisinopril (PRINIVIL,ZESTRIL) 20 MG tablet Take 1 tablet by mouth daily.    . Multiple Vitamins-Minerals (PRESERVISION AREDS 2 PO) Take 2 tablets by mouth daily.    . Omega-3 Fatty Acids (FISH OIL) 1200 MG CAPS Take 1 capsule by mouth daily.    Marland Kitchen PHYTOSTEROLS PO Take 1 tablet by mouth daily.    . SF 5000 PLUS 1.1 % CREA dental cream Place 1 application onto teeth at bedtime.     . tamsulosin (FLOMAX) 0.4 MG CAPS capsule Take 1 capsule  by mouth daily.    Marland Kitchen testosterone (ANDROGEL) 50 MG/5GM (1%) GEL Place 5 g onto the skin daily.     No current facility-administered medications for this visit.    Functional Status:  In your present state of health, do you have any difficulty performing the following activities: 11/17/2014 11/05/2014  Hearing? N N  Vision? N N  Difficulty concentrating or making decisions? N N  Walking or climbing stairs? N N  Dressing or bathing? N N  Doing errands, shopping? N N  Preparing Food and eating ? N N  Using the Toilet? N N  In the past six months, have you accidently leaked urine? N N  Do you have problems with loss of bowel control? N N  Managing your Medications? N N  Managing your Finances? N N  Housekeeping or managing your Housekeeping? N N    Fall/Depression Screening: PHQ 2/9 Scores 11/17/2014 08/26/2014 08/05/2014  PHQ - 2 Score 0 0 0   THN CM Care Plan Problem One        Most Recent Value   Care Plan Problem One  Knowledge deficit COPD   Role Documenting the Problem One  [place] for Problem One  Active   THN Long Term Goal (31-90 days)  Patient will be able to explain COPD zones  on action plan within the next 90 days    THN Long Term Goal Start Date  11/17/14   Interventions for Problem One [place] to send COPD information to patient that includes action plan.  Discussed with patient purpose of zone chart.     THN CM Short Term Goal #1 (0-30 days)  Patient will be able to describe COPD green zone within 30 days.     THN CM Short Term Goal #1 Start Date  11/17/14   Interventions for Short Term Goal #1  Discussed with patient green zone on action plan.  Action plan sent to patient    THN CM Short Term Goal #2 (0-30 days)  Patient will be able to describe techniques of controlling breath during episodes of shortness breath within 30 days.    THN CM Short Term Goal #2 Start Date  11/17/14   Interventions for Short Term Goal #2  Discussed with  patient pursed lipped breath. Health Coach encouraged patient to use frequent rest periods to conserve energy.        Assessment: Patient will benefit from health coach monthly outreach for disease management of COPD.  Plan: RN Health Coach will provide ongoing education for patient on COPD through phone calls and sending printed information to patient for further discussion. RN Health Coach will send welcome packet with consent to patient as well as printed information on COPD. RN Health Coach will send initial barriers letter, assessment, and care plan to primary care physician. RN Health Coach will contact patient within one month and patient agrees to next contact.   [name], RN, MSN [place] [phone]

## 2014-11-17 NOTE — Patient Outreach (Signed)
[facility] [facility]) Care Management  11/17/2014  Kayle Correa 1946/03/11 [id number]  Returned telephone call to patient for initial health coach outreach.  No answer.  HIPAA compliant voice message left.   Plan: Wait patient's return call or will attempt patient within 1-2 weeks.  [name], RN, MSN [place] [phone]

## 2014-12-05 DIAGNOSIS — B351 Tinea unguium: Secondary | ICD-10-CM | POA: Diagnosis not present

## 2014-12-08 ENCOUNTER — Ambulatory Visit: Payer: Self-pay

## 2014-12-09 ENCOUNTER — Other Ambulatory Visit: Payer: Self-pay

## 2014-12-09 NOTE — Patient Outreach (Signed)
[name] Care Management  12/09/2014  [name] [name] 07-Nov-1945 [id number]   Telephone call to patient to reschedule missed call on 12-09-14.  No answer. HIPAA compliant voice message left.    Plan: RN Health Coach will wait patient return call.    [name], RN, MSN [place] [phone]

## 2014-12-11 ENCOUNTER — Other Ambulatory Visit: Payer: Self-pay

## 2014-12-11 NOTE — Patient Outreach (Signed)
[facility]) Care Management  [place]  12/11/2014   [name] [name] 04-23-1945 [id number]  Subjective:  Telephone call to patient for monthly call.  Patient reports he is getting ready for his trip. He denies any hospitalization or emergency room visits.    COPD: Patient reports he has had no problems with his breathing. He reports some problems when he is active.  Patient continues to exercise frequently.   Discussed with patient COPD action plan.    Consent: Patient reports he received his packet but no consent was in packet.  Advised health coach will get another consent sent to him.  He verbalized understanding.    Objective:   Current Medications:  Current Outpatient Prescriptions  Medication Sig Dispense Refill  . ALPRAZolam (XANAX) 0.25 MG tablet Take 0.25 mg by mouth at bedtime as needed for anxiety.    Marland Kitchen amLODipine (NORVASC) 2.5 MG tablet Take 1 tablet by mouth daily.    Marland Kitchen aspirin EC 81 MG tablet Take 81 mg by mouth daily.    . Calcium Citrate-Vitamin D (CALCIUM CITRATE + D3 PO) Take 1 tablet by mouth daily.    Mariane Baumgarten Calcium (STOOL SOFTENER PO) Take 1 capsule by mouth daily as needed.    . Lactobacillus (ACIDOPHILUS PROBIOTIC) 10 MG TABS Take 1 tablet by mouth daily.    Marland Kitchen lisinopril (PRINIVIL,ZESTRIL) 20 MG tablet Take 1 tablet by mouth daily.    . Multiple Vitamins-Minerals (PRESERVISION AREDS 2 PO) Take 2 tablets by mouth daily.    . Omega-3 Fatty Acids (FISH OIL) 1200 MG CAPS Take 1 capsule by mouth daily.    Marland Kitchen PHYTOSTEROLS PO Take 1 tablet by mouth daily.    . SF 5000 PLUS 1.1 % CREA dental cream Place 1 application onto teeth at bedtime.     . tamsulosin (FLOMAX) 0.4 MG CAPS capsule Take 1 capsule by mouth daily.    Marland Kitchen testosterone (ANDROGEL) 50 MG/5GM (1%) GEL Place 5 g onto the skin daily.     No current facility-administered medications for this visit.    Functional Status:  In your present state of health, do you have any difficulty  performing the following activities: 11/17/2014 11/05/2014  Hearing? N N  Vision? N N  Difficulty concentrating or making decisions? N N  Walking or climbing stairs? N N  Dressing or bathing? N N  Doing errands, shopping? N N  Preparing Food and eating ? N N  Using the Toilet? N N  In the past six months, have you accidently leaked urine? N N  Do you have problems with loss of bowel control? N N  Managing your Medications? N N  Managing your Finances? N N  Housekeeping or managing your Housekeeping? N N    Fall/Depression Screening: PHQ 2/9 Scores 12/11/2014 11/17/2014 08/26/2014 08/05/2014  PHQ - 2 Score 0 0 0 0    Assessment: Patient continues to benefit from health outreach for disease management.    Plan:  [facility] CM Care Plan Problem One        Most Recent Value   Care Plan Problem One  Knowledge deficit COPD   Role Documenting the Problem One  Health Coach   Care Plan for Problem One  Active   THN Long Term Goal (31-90 days)  Patient will be able to explain COPD zones on action plan within the next 90 days    THN Long Term Goal Start Date  11/17/14   Interventions for Problem One Long  Term Goal  Discussed with patient receiving COPD  packet.  Discussed with patient COPD green zone.     THN CM Short Term Goal #1 (0-30 days)  Patient will be able to describe COPD green zone within 30 days.     THN CM Short Term Goal #1 Start Date  12/11/14 [goal continued]   Interventions for Short Term Goal #1  Reviewed green zone of COPD zone chart with patient.     THN CM Short Term Goal #2 (0-30 days)  Patient will be able to describe techniques of controlling breath during episodes of shortness breath within 30 days.    THN CM Short Term Goal #2 Start Date  11/17/14   [facility] CM Short Term Goal #2 Met Date  12/11/14   Interventions for Short Term Goal #2  Discussed with patient pursed lipped breath. Health Coach encouraged patient to use frequent rest periods to conserve energy.        Plan: RN  Health Coach will send patient another consent for patient to complete and return.   RN Health Coach will contact patient within one month and patient agrees with next outreach.    [name], RN, MSN [place] [phone]

## 2015-01-08 ENCOUNTER — Telehealth: Payer: Self-pay

## 2015-01-08 ENCOUNTER — Other Ambulatory Visit: Payer: Self-pay

## 2015-01-08 NOTE — Telephone Encounter (Signed)
Pt's wife had previously called to schedule his next colonoscopy/ She said he had a hx of tubular adenoma and last one was done in 02/2010 somewhere else. She had said they would wait til end of the year. I'm mailing a reminder.

## 2015-01-08 NOTE — Patient Outreach (Signed)
[facility]) Care Management  [place]  01/08/2015   Christine Schiefelbein January 06, 1946 [id number]  Subjective: Telephone call to patient for monthly call. Patient reports he is doing good.  Recent return from long trip.  Patient reports he has had his flu vaccine.    COPD: Patient reports he has had no problems with his breathing.  Discussed with patient COPD action plan and patient able to read out signs and symptoms of yellow zone.  Advised patient that COPD action plan is helpful for if he gets in trouble.  He verbalized understanding.  Reviewed with patient signs and symptoms of COPD red zone.    Objective:   Current Medications:  Current Outpatient Prescriptions  Medication Sig Dispense Refill  . ALPRAZolam (XANAX) 0.25 MG tablet Take 0.25 mg by mouth at bedtime as needed for anxiety.    Marland Kitchen amLODipine (NORVASC) 2.5 MG tablet Take 1 tablet by mouth daily.    Marland Kitchen aspirin EC 81 MG tablet Take 81 mg by mouth daily.    . Calcium Citrate-Vitamin D (CALCIUM CITRATE + D3 PO) Take 1 tablet by mouth daily.    Mariane Baumgarten Calcium (STOOL SOFTENER PO) Take 1 capsule by mouth daily as needed.    . Lactobacillus (ACIDOPHILUS PROBIOTIC) 10 MG TABS Take 1 tablet by mouth daily.    Marland Kitchen lisinopril (PRINIVIL,ZESTRIL) 20 MG tablet Take 1 tablet by mouth daily.    . Multiple Vitamins-Minerals (PRESERVISION AREDS 2 PO) Take 2 tablets by mouth daily.    . Omega-3 Fatty Acids (FISH OIL) 1200 MG CAPS Take 1 capsule by mouth daily.    Marland Kitchen PHYTOSTEROLS PO Take 1 tablet by mouth daily.    . SF 5000 PLUS 1.1 % CREA dental cream Place 1 application onto teeth at bedtime.     . tamsulosin (FLOMAX) 0.4 MG CAPS capsule Take 1 capsule by mouth daily.    Marland Kitchen testosterone (ANDROGEL) 50 MG/5GM (1%) GEL Place 5 g onto the skin daily.     No current facility-administered medications for this visit.    Functional Status:  In your present state of health, do you have any difficulty performing the following  activities: 11/17/2014 11/05/2014  Hearing? N N  Vision? N N  Difficulty concentrating or making decisions? N N  Walking or climbing stairs? N N  Dressing or bathing? N N  Doing errands, shopping? N N  Preparing Food and eating ? N N  Using the Toilet? N N  In the past six months, have you accidently leaked urine? N N  Do you have problems with loss of bowel control? N N  Managing your Medications? N N  Managing your Finances? N N  Housekeeping or managing your Housekeeping? N N    Fall/Depression Screening: PHQ 2/9 Scores 01/08/2015 12/11/2014 11/17/2014 08/26/2014 08/05/2014  PHQ - 2 Score 0 0 0 0 0    Assessment: Patient doing a great job maintaining health.  Discussed case closure on next outreach and patient in agreement.    Plan:   [facility] CM Care Plan Problem One        Most Recent Value   Care Plan Problem One  Knowledge deficit COPD   Role Documenting the Problem One  Health Coach   Care Plan for Problem One  Active   THN Long Term Goal (31-90 days)  Patient will be able to explain COPD zones on action plan within the next 90 days    THN Long Term Goal Start Date  11/17/14   Interventions for Problem One Long Term Goal  Reviewed action plan with patient   THN CM Short Term Goal #1 (0-30 days)  Patient will be able to describe COPD green zone within 30 days.     THN CM Short Term Goal #1 Start Date  12/11/14 [goal continued]   Interventions for Short Term Goal #1  Patient able to read signs and symptoms of green zone.   THN CM Short Term Goal #2 (0-30 days)  Patient will be able to describe techniques of controlling breath during episodes of shortness breath within 30 days.    THN CM Short Term Goal #2 Start Date  11/17/14   [facility] CM Short Term Goal #2 Met Date  12/11/14   Interventions for Short Term Goal #2  Discussed with patient pursed lipped breath. Health Coach encouraged patient to use frequent rest periods to conserve energy.     THN CM Short Term Goal #3 (0-30 days)   Patient will be able to verbalize yellow and red zones of COPD action plan within 30 days.Margie Billet CM Short Term Goal #3 Start Date  01/08/15   Interventions for Short Tern Goal #3  Reviewed yellow and red zones of COPD action plan.        RN Health Coach will contact patient within one month and patient in agreement.   [name], RN, MSN [place] [phone]

## 2015-01-28 ENCOUNTER — Other Ambulatory Visit: Payer: Self-pay

## 2015-01-28 NOTE — Patient Outreach (Addendum)
[facility]) Care Management  01/27/15  Tarrence Enck [birth year] [id number]   Telephone call to patient to reschedule Call.  Patient able to reschedule phone call to 02-06-15.   Plan: RN Health Coach will outreach to patient on 02-06-15.    [name], RN, MSN [place] [phone]

## 2015-02-05 ENCOUNTER — Ambulatory Visit: Payer: Medicare Other

## 2015-02-05 ENCOUNTER — Encounter: Payer: Self-pay | Admitting: Nurse Practitioner

## 2015-02-05 ENCOUNTER — Other Ambulatory Visit: Payer: Self-pay

## 2015-02-05 ENCOUNTER — Ambulatory Visit (INDEPENDENT_AMBULATORY_CARE_PROVIDER_SITE_OTHER): Payer: Medicare Other | Admitting: Nurse Practitioner

## 2015-02-05 ENCOUNTER — Encounter: Payer: Self-pay | Admitting: Internal Medicine

## 2015-02-05 VITALS — BP 131/73 | HR 68 | Temp 98.5°F | Ht 72.0 in | Wt 194.6 lb

## 2015-02-05 DIAGNOSIS — K649 Unspecified hemorrhoids: Secondary | ICD-10-CM | POA: Diagnosis not present

## 2015-02-05 DIAGNOSIS — Z86010 Personal history of colonic polyps: Secondary | ICD-10-CM | POA: Diagnosis not present

## 2015-02-05 MED ORDER — PEG 3350-KCL-NA BICARB-NACL 420 G PO SOLR: 4000.0000 mL | Freq: Once | ORAL | Status: DC

## 2015-02-05 NOTE — Progress Notes (Signed)
Primary Care Physician:  Alonza Bogus, MD Primary Gastroenterologist:  Dr. Oneida Alar  Chief Complaint  Patient presents with  . Colonoscopy    HPI:   69 year old male presents on referral from PCP for surveillance colonoscopy. Somewhere there is a note of history of tubular adenoma which is why he was brought in for an office visit rather than from triage. PCP notes reviewed. No previous colonoscopy records can be found in our system.  Today he states his last colonoscopy was 5 years ago at [facility] in [place], [place] now [facility]. They found a tubular adenoma and recommended 5 year repeat exam. Will request those records. States his last prep did not work well and exam was compromised. Cannot remember exactly what prep was used, but staff told him it was "different" then when most providers use. Denies abdominal pain. Admits some constipation since lung surgery April 2016 at [facility]. Has a bowel movement about every other day but stools are harder. Denies hematochezia and melena, unintentional weight loss, fever, chills. Does have hemorrhodi symptoms including irritation, pain, difficulty cleaning after a bowel movement. Denies chest pain, dyspnea, dizziness, lightheadedness, syncope, near syncope. Denies any other upper or lower GI symptoms.  Past Medical History  Diagnosis Date  . Hypertension   . Urinary frequency   . Low serum testosterone level   . Shortness of breath dyspnea   . Pneumonia 05/2014  . COPD (chronic obstructive pulmonary disease) [facility])     Past Surgical History  Procedure Laterality Date  . Hernia repair    . Appendectomy    . Video bronchoscopy N/A 06/27/2014    Procedure: VIDEO BRONCHOSCOPY;  Surgeon: Grace Isaac, MD;  Location: [facility];  Service: Thoracic;  Laterality: N/A;  . Video assisted thoracoscopy Right 06/27/2014    Procedure: VIDEO ASSISTED THORACOSCOPY;  Surgeon: Grace Isaac, MD;  Location: [facility];  Service: Thoracic;  Laterality:  Right;  . Empyema drainage Right 06/27/2014    Procedure: EMPYEMA DRAINAGE;  Surgeon: Grace Isaac, MD;  Location: [facility];  Service: Thoracic;  Laterality: Right;  . Varicose vein surgery    . Tee without cardioversion N/A 07/14/2014    Procedure: TRANSESOPHAGEAL ECHOCARDIOGRAM (TEE);  Surgeon: Sueanne Margarita, MD;  Location: [facility];  Service: Cardiovascular;  Laterality: N/A;    Current Outpatient Prescriptions  Medication Sig Dispense Refill  . ALPRAZolam (XANAX) 0.25 MG tablet Take 0.25 mg by mouth at bedtime as needed for anxiety.    Marland Kitchen amLODipine (NORVASC) 2.5 MG tablet Take 1 tablet by mouth daily.    Marland Kitchen aspirin EC 81 MG tablet Take 81 mg by mouth daily.    . Calcium Citrate-Vitamin D (CALCIUM CITRATE + D3 PO) Take 1 tablet by mouth daily.    Mariane Baumgarten Calcium (STOOL SOFTENER PO) Take 1 capsule by mouth daily as needed.    . Lactobacillus (ACIDOPHILUS PROBIOTIC) 10 MG TABS Take 1 tablet by mouth daily.    Marland Kitchen lisinopril (PRINIVIL,ZESTRIL) 20 MG tablet Take 1 tablet by mouth daily.    . Multiple Vitamins-Minerals (PRESERVISION AREDS 2 PO) Take 2 tablets by mouth daily.    . Omega-3 Fatty Acids (FISH OIL) 1200 MG CAPS Take 1 capsule by mouth daily.    Marland Kitchen PHYTOSTEROLS PO Take 1 tablet by mouth daily.    . tamsulosin (FLOMAX) 0.4 MG CAPS capsule Take 1 capsule by mouth daily.    Marland Kitchen testosterone (ANDROGEL) 50 MG/5GM (1%) GEL Place 5 g onto the  skin daily.    . SF 5000 PLUS 1.1 % CREA dental cream Place 1 application onto teeth at bedtime.      No current facility-administered medications for this visit.    Allergies as of 02/05/2015  . (No Known Allergies)    Family History  Problem Relation Age of Onset  . Tuberculosis Father   . Hypertension Father   . COPD Mother   . Hypertension Mother     Social History   Social History  . Marital Status: Married    Spouse Name: N/A  . Number of Children: N/A  . Years of Education: N/A   Occupational History  . Not on file.    Social History Main Topics  . Smoking status: Former Smoker    Types: Pipe    Quit date: 06/07/2014  . Smokeless tobacco: Never Used  . Alcohol Use: 0.0 oz/week    0 Standard drinks or equivalent per week     Comment: occasionally  . Drug Use: No  . Sexual Activity: Not on file   Other Topics Concern  . Not on file   Social History Narrative    Review of Systems: 10 point ROS negative except as per HPI.    Physical Exam: BP 131/73 mmHg  Pulse 68  Temp(Src) 98.5 F (36.9 C) (Oral)  Ht 6' (1.829 m)  Wt 194 lb 9.6 oz (88.27 kg)  BMI 26.39 kg/m2 General:   Alert and oriented. Pleasant and cooperative. Well-nourished and well-developed.  Head:  Normocephalic and atraumatic. Eyes:  Without icterus, sclera clear and conjunctiva pink.  Ears:  Normal auditory acuity. Mouth:  No deformity or lesions, oral mucosa pink.  Throat/Neck:  Supple, without mass or thyromegaly. Cardiovascular:  S1, S2 present without murmurs appreciated. Normal pulses noted. Extremities without clubbing or edema. Respiratory:  Clear to auscultation bilaterally. No wheezes, rales, or rhonchi. No distress.  Gastrointestinal:  +BS, soft, non-tender and non-distended. No HSM noted. No guarding or rebound. No masses appreciated.  Rectal:  Deferred  Musculoskalatal:  Symmetrical without gross deformities. Normal posture. Skin:  Intact without significant lesions or rashes. Neurologic:  Alert and oriented x4;  grossly normal neurologically. Psych:  Alert and cooperative. Normal mood and affect. Heme/Lymph/Immune: No significant cervical adenopathy. No excessive bruising noted.    02/05/2015 8:52 AM

## 2015-02-05 NOTE — Progress Notes (Signed)
cc'ed to pcp °

## 2015-02-05 NOTE — Patient Instructions (Signed)
1. We will request her last colonoscopy records from Dr. Derald Macleod. 2. We will schedule you for your procedure with possible hemorrhoid banding. 3. Further recommendations to be based on results of your procedure. 4. Return for follow-up as needed for any GI symptoms.

## 2015-02-05 NOTE — Assessment & Plan Note (Signed)
Patient with a history of tubular adenoma colonoscopy performed in [place] approximately 5 years ago. We have requested records from Dr. Derald Macleod at [facility]. He was recommended to have a 5 year repeat. His last prep was not good although it seems like he had a bit of an unconventional prep. He is generally asymptomatic from a GI standpoint. We will proceed with colonoscopy at this time.  Proceed with colonoscopy +/- hemorrhoid banding and 12.5 mg IV Phenergan pre-procedure with Dr. Oneida Alar in the near future. The risks, benefits, and alternatives have been discussed in detail with the patient. They state understanding and desire to proceed.   Patient is not on any anticoagulants, chronic pain medications, or antidepressants. He takes Xanax 0.25 occasionally at bedtime as needed for sleep. He drinks about 1-2 drinks an evening. We will add 12.5 mg IV Phenergan prior to this procedure to promote adequate sedation.

## 2015-02-05 NOTE — Assessment & Plan Note (Signed)
Patient with a history of hemorrhoids which cause him irritation and pain. He also has difficulty cleaning himself. He would like these banded during his colonoscopy if possible. He can call our office for rectal cream in the interim should he need it.

## 2015-02-06 ENCOUNTER — Other Ambulatory Visit: Payer: Self-pay

## 2015-02-06 NOTE — Patient Outreach (Signed)
[facility]) Care Management  [place]  02/06/2015   [name] [name] [birth year] [id number]  Subjective: Telephone call to patient for closure call.  Patient states he is doing good.  No breathing issues.  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  Patient states he is ok with case closure as he had improved but knows contact information if resource is needed.    Objective:   Current Medications:  Current Outpatient Prescriptions  Medication Sig Dispense Refill  . ALPRAZolam (XANAX) 0.25 MG tablet Take 0.25 mg by mouth at bedtime as needed for anxiety.    Marland Kitchen amLODipine (NORVASC) 2.5 MG tablet Take 1 tablet by mouth daily.    Marland Kitchen aspirin EC 81 MG tablet Take 81 mg by mouth daily.    . Calcium Citrate-Vitamin D (CALCIUM CITRATE + D3 PO) Take 1 tablet by mouth daily.    Mariane Baumgarten Calcium (STOOL SOFTENER PO) Take 1 capsule by mouth daily as needed.    . Lactobacillus (ACIDOPHILUS PROBIOTIC) 10 MG TABS Take 1 tablet by mouth daily.    Marland Kitchen lisinopril (PRINIVIL,ZESTRIL) 20 MG tablet Take 1 tablet by mouth daily.    . Multiple Vitamins-Minerals (PRESERVISION AREDS 2 PO) Take 2 tablets by mouth daily.    . Omega-3 Fatty Acids (FISH OIL) 1200 MG CAPS Take 1 capsule by mouth daily.    Marland Kitchen PHYTOSTEROLS PO Take 1 tablet by mouth daily.    . polyethylene glycol-electrolytes (NULYTELY/GOLYTELY) 420 G solution Take 4,000 mLs by mouth once. 4000 mL 0  . tamsulosin (FLOMAX) 0.4 MG CAPS capsule Take 1 capsule by mouth daily.    Marland Kitchen testosterone (ANDROGEL) 50 MG/5GM (1%) GEL Place 5 g onto the skin daily.     No current facility-administered medications for this visit.    Functional Status:  In your present state of health, do you have any difficulty performing the following activities: 11/17/2014 11/05/2014  Hearing? N N  Vision? N N  Difficulty concentrating or making decisions? N N  Walking or climbing stairs? N N  Dressing or bathing? N N  Doing  errands, shopping? N N  Preparing Food and eating ? N N  Using the Toilet? N N  In the past six months, have you accidently leaked urine? N N  Do you have problems with loss of bowel control? N N  Managing your Medications? N N  Managing your Finances? N N  Housekeeping or managing your Housekeeping? N N    Fall/Depression Screening: PHQ 2/9 Scores 02/06/2015 01/08/2015 12/11/2014 11/17/2014 08/26/2014 08/05/2014  PHQ - 2 Score 0 0 0 0 0 0    Assessment:  Patient has met goals of care for self management of COPD.    Plan:  [facility] CM Care Plan Problem One        Most Recent Value   Care Plan Problem One  Knowledge deficit COPD   Role Documenting the Problem One  Health Coach   Care Plan for Problem One  Active   THN Long Term Goal (31-90 days)  Patient will be able to explain COPD zones on action plan within the next 90 days    THN Long Term Goal Start Date  11/17/14   [facility] Long Term Goal Met Date  02/06/15   Interventions for Problem One Long Term Goal  Reviewed action plan with patient   THN CM Short Term Goal #1 (0-30 days)  Patient will be able to describe COPD green  zone within 30 days.     THN CM Short Term Goal #1 Start Date  12/11/14 [goal continued]   [facility] CM Short Term Goal #1 Met Date  02/06/15   Interventions for Short Term Goal #1  Patient able to read signs and symptoms of green zone.   THN CM Short Term Goal #2 (0-30 days)  Patient will be able to describe techniques of controlling breath during episodes of shortness breath within 30 days.    THN CM Short Term Goal #2 Start Date  11/17/14   [facility] CM Short Term Goal #2 Met Date  12/11/14   Interventions for Short Term Goal #2  Discussed with patient pursed lipped breath. Health Coach encouraged patient to use frequent rest periods to conserve energy.     THN CM Short Term Goal #3 (0-30 days)  Patient will be able to verbalize yellow and red zones of COPD action plan within 30 days.Margie Billet CM Short Term Goal #3 Start Date   01/08/15   [facility] CM Short Term Goal #3 Met Date  02/06/15   Interventions for Short Tern Goal #3  Reviewed yellow and red zones of COPD action plan.       RN Health Coach will send closure letter to patient and physician. RN Health Coach will message Lurline Del, Care Management Assistant for case closure.

## 2015-02-24 ENCOUNTER — Other Ambulatory Visit (HOSPITAL_COMMUNITY): Payer: Self-pay | Admitting: Pulmonary Disease

## 2015-02-24 DIAGNOSIS — I38 Endocarditis, valve unspecified: Secondary | ICD-10-CM | POA: Diagnosis not present

## 2015-02-24 DIAGNOSIS — N390 Urinary tract infection, site not specified: Secondary | ICD-10-CM | POA: Diagnosis not present

## 2015-02-24 DIAGNOSIS — I872 Venous insufficiency (chronic) (peripheral): Secondary | ICD-10-CM

## 2015-02-24 DIAGNOSIS — I739 Peripheral vascular disease, unspecified: Secondary | ICD-10-CM

## 2015-02-24 DIAGNOSIS — J449 Chronic obstructive pulmonary disease, unspecified: Secondary | ICD-10-CM | POA: Diagnosis not present

## 2015-02-27 ENCOUNTER — Other Ambulatory Visit (HOSPITAL_COMMUNITY): Payer: Self-pay | Admitting: Pulmonary Disease

## 2015-02-27 ENCOUNTER — Ambulatory Visit (HOSPITAL_COMMUNITY)
Admission: RE | Admit: 2015-02-27 | Discharge: 2015-02-27 | Disposition: A | Discharge status: Home / Self Care | Payer: Medicare Other | Source: Ambulatory Visit | Attending: Pulmonary Disease | Admitting: Pulmonary Disease

## 2015-02-27 DIAGNOSIS — M79669 Pain in unspecified lower leg: Secondary | ICD-10-CM | POA: Insufficient documentation

## 2015-02-27 DIAGNOSIS — I739 Peripheral vascular disease, unspecified: Secondary | ICD-10-CM

## 2015-02-27 DIAGNOSIS — I872 Venous insufficiency (chronic) (peripheral): Secondary | ICD-10-CM | POA: Diagnosis not present

## 2015-02-27 DIAGNOSIS — M79662 Pain in left lower leg: Secondary | ICD-10-CM | POA: Diagnosis not present

## 2015-03-02 DIAGNOSIS — I10 Essential (primary) hypertension: Secondary | ICD-10-CM | POA: Diagnosis not present

## 2015-03-02 DIAGNOSIS — J449 Chronic obstructive pulmonary disease, unspecified: Secondary | ICD-10-CM | POA: Diagnosis not present

## 2015-03-02 DIAGNOSIS — I38 Endocarditis, valve unspecified: Secondary | ICD-10-CM | POA: Diagnosis not present

## 2015-03-02 DIAGNOSIS — I739 Peripheral vascular disease, unspecified: Secondary | ICD-10-CM | POA: Diagnosis not present

## 2015-03-02 LAB — HM HEPATITIS C SCREENING LAB: HM Hepatitis Screen: NEGATIVE

## 2015-03-03 ENCOUNTER — Encounter (HOSPITAL_COMMUNITY): Payer: Self-pay | Admitting: *Deleted

## 2015-03-03 ENCOUNTER — Ambulatory Visit (HOSPITAL_COMMUNITY)
Admission: RE | Admit: 2015-03-03 | Discharge: 2015-03-03 | Disposition: A | Discharge status: Home / Self Care | Payer: Medicare Other | Source: Ambulatory Visit | Attending: Gastroenterology | Admitting: Gastroenterology

## 2015-03-03 ENCOUNTER — Encounter (HOSPITAL_COMMUNITY)
Admission: RE | Disposition: A | Discharge status: Home / Self Care | Payer: Self-pay | Source: Ambulatory Visit | Attending: Gastroenterology

## 2015-03-03 DIAGNOSIS — I10 Essential (primary) hypertension: Secondary | ICD-10-CM | POA: Diagnosis not present

## 2015-03-03 DIAGNOSIS — Z1211 Encounter for screening for malignant neoplasm of colon: Secondary | ICD-10-CM | POA: Diagnosis not present

## 2015-03-03 DIAGNOSIS — Z8249 Family history of ischemic heart disease and other diseases of the circulatory system: Secondary | ICD-10-CM | POA: Diagnosis not present

## 2015-03-03 DIAGNOSIS — K5730 Diverticulosis of large intestine without perforation or abscess without bleeding: Secondary | ICD-10-CM | POA: Diagnosis not present

## 2015-03-03 DIAGNOSIS — Z79899 Other long term (current) drug therapy: Secondary | ICD-10-CM | POA: Insufficient documentation

## 2015-03-03 DIAGNOSIS — Z87891 Personal history of nicotine dependence: Secondary | ICD-10-CM | POA: Diagnosis not present

## 2015-03-03 DIAGNOSIS — K648 Other hemorrhoids: Secondary | ICD-10-CM | POA: Insufficient documentation

## 2015-03-03 DIAGNOSIS — Z86010 Personal history of colonic polyps: Secondary | ICD-10-CM | POA: Diagnosis not present

## 2015-03-03 DIAGNOSIS — K644 Residual hemorrhoidal skin tags: Secondary | ICD-10-CM | POA: Insufficient documentation

## 2015-03-03 DIAGNOSIS — J449 Chronic obstructive pulmonary disease, unspecified: Secondary | ICD-10-CM | POA: Insufficient documentation

## 2015-03-03 DIAGNOSIS — K649 Unspecified hemorrhoids: Secondary | ICD-10-CM

## 2015-03-03 DIAGNOSIS — Z7982 Long term (current) use of aspirin: Secondary | ICD-10-CM | POA: Insufficient documentation

## 2015-03-03 DIAGNOSIS — Z800 Family history of malignant neoplasm of digestive organs: Secondary | ICD-10-CM | POA: Diagnosis not present

## 2015-03-03 HISTORY — PX: HEMORRHOID BANDING: SHX5850

## 2015-03-03 HISTORY — PX: COLONOSCOPY: SHX5424

## 2015-03-03 SURGERY — COLONOSCOPY
Anesthesia: Moderate Sedation

## 2015-03-03 MED ORDER — MIDAZOLAM HCL 5 MG/5ML IJ SOLN
INTRAMUSCULAR | Status: DC | PRN
  Administered 2015-03-03 (×2): 1 mg via INTRAVENOUS
  Administered 2015-03-03: 2 mg via INTRAVENOUS
  Administered 2015-03-03: 1 mg via INTRAVENOUS

## 2015-03-03 MED ORDER — PROMETHAZINE HCL 25 MG/ML IJ SOLN
12.5000 mg | Freq: Once | INTRAMUSCULAR | Status: AC
  Administered 2015-03-03: 12.5 mg via INTRAVENOUS

## 2015-03-03 MED ORDER — MEPERIDINE HCL 100 MG/ML IJ SOLN
INTRAMUSCULAR | Status: DC
Start: 2015-03-03 — End: 2015-03-03
  Filled 2015-03-03: qty 2

## 2015-03-03 MED ORDER — MIDAZOLAM HCL 5 MG/5ML IJ SOLN
INTRAMUSCULAR | Status: AC
  Filled 2015-03-03: qty 10

## 2015-03-03 MED ORDER — SODIUM CHLORIDE 0.9 % IV SOLN
INTRAVENOUS | Status: DC | PRN
  Administered 2015-03-03: 1000 mL via INTRAVENOUS

## 2015-03-03 MED ORDER — MEPERIDINE HCL 100 MG/ML IJ SOLN
INTRAMUSCULAR | Status: DC | PRN
  Administered 2015-03-03: 50 mg via INTRAVENOUS
  Administered 2015-03-03: 25 mg via INTRAVENOUS

## 2015-03-03 MED ORDER — SODIUM CHLORIDE 0.9 % IJ SOLN
INTRAMUSCULAR | Status: AC
  Filled 2015-03-03: qty 3

## 2015-03-03 MED ORDER — PROMETHAZINE HCL 25 MG/ML IJ SOLN
INTRAMUSCULAR | Status: AC
  Filled 2015-03-03: qty 1

## 2015-03-03 MED ORDER — SODIUM CHLORIDE 0.9 % IV SOLN
INTRAVENOUS | Status: DC
  Administered 2015-03-03: 12:00:00 via INTRAVENOUS

## 2015-03-03 MED ORDER — STERILE WATER FOR IRRIGATION IR SOLN
Status: DC | PRN
  Administered 2015-03-03: 13:00:00

## 2015-03-03 NOTE — Op Note (Signed)
[facility] [address] [place], [address]   COLONOSCOPY PROCEDURE REPORT  PATIENT: [name] [name], [name] [name]  MR#: [id number] BIRTHDATE: Dec 29, 1945 , 89  yrs. old GENDER: male ENDOSCOPIST: Danie Binder, MD REFERRED QP:[id number] Hawkins, M.D. PROCEDURE DATE:  Mar 18, 2015 PROCEDURE:   Colonoscopy, screening INDICATIONS:high risk patient with personal history of colonic polyps. MEDICATIONS: Demerol 75 mg IV and Versed 5 mg IV  DESCRIPTION OF PROCEDURE:    Physical exam was performed.  Informed consent was obtained from the patient after explaining the benefits, risks, and alternatives to procedure.  The patient was connected to monitor and placed in left lateral position. Continuous oxygen was provided by nasal cannula and IV medicine administered through an indwelling cannula.  After administration of sedation and rectal exam, the patients rectum was intubated and the [id number]  colonoscope was advanced under direct visualization to the cecum.  The scope was removed slowly by carefully examining the color, texture, anatomy, and integrity mucosa on the way out.  The patient was recovered in endoscopy and discharged home in satisfactory condition. Estimated blood loss is zero unless otherwise noted in this procedure report.    COLON FINDINGS: The colon was redundant.  Manual abdominal counter-pressure was used to reach the cecum.  The patient was moved on to their back to reach the cecum, There was mild diverticulosis noted in the sigmoid colon and descending colon with associated muscular hypertrophy and tortuosity.  , Small internal hemorrhoids were found.  , and Moderate sized external hemorrhoids were found.  PREP QUALITY: good. CECAL W/D TIME: 20       minutes COMPLICATIONS: None  ENDOSCOPIC IMPRESSION: 1.   The LEFT colon IS redundant 2.   Mild diverticulosis in the sigmoid colon and descending colon 3.   Small internal hemorrhoids 4.   Moderate sized  external hemorrhoids  RECOMMENDATIONS: SEE A SURGEON TO FIX YOUR HEMORRHOIDS. Next colonoscopy in 5 years WITH AN OVERTUBE. FOLLOW A HIGH FIBER DIET.     _______________________________ Lorrin MaisDanie Binder, MD 03-18-15 3:09 PM    CPT CODES: ICD CODES:  The ICD and CPT codes recommended by this software are interpretations from the data that the clinical staff has captured with the software.  The verification of the translation of this report to the ICD and CPT codes and modifiers is the sole responsibility of the health care institution and practicing physician where this report was generated.  [employer] will not be held responsible for the validity of the ICD and CPT codes included on this report.  AMA assumes no liability for data contained or not contained herein. CPT is a Designer, television/film set of the [employer].

## 2015-03-03 NOTE — Progress Notes (Signed)
REVIEWED-NO ADDITIONAL RECOMMENDATIONS. 

## 2015-03-03 NOTE — Discharge Instructions (Signed)
You have small internal hemorrhoids AND LARGE EXTERNAL HEMORRHOIDS. YOUHAVE A MILD CASE OF diverticulosis IN YOUR LEFT COLON. YOU DO HAVE A FLOPPY LEFT COLON.  YOU NEED TO SEE A SURGEON TO FIX YOUR HEMORRHOIDS.  Next colonoscopy in 5 years.  FOLLOW A HIGH FIBER DIET.AVOID ITEMS THAT CAUSE BLOATING & GAS. See info below.  Colonoscopy Care After Read the instructions outlined below and refer to this sheet in the next week. These discharge instructions provide you with general information on caring for yourself after you leave the hospital. While your treatment has been planned according to the most current medical practices available, unavoidable complications occasionally occur. If you have any problems or questions after discharge, call DR. FIELDS, [phone].  ACTIVITY  You may resume your regular activity, but move at a slower pace for the next 24 hours.   Take frequent rest periods for the next 24 hours.   Walking will help get rid of the air and reduce the bloated feeling in your belly (abdomen).   No driving for 24 hours (because of the medicine (anesthesia) used during the test).   You may shower.   Do not sign any important legal documents or operate any machinery for 24 hours (because of the anesthesia used during the test).    NUTRITION  Drink plenty of fluids.   You may resume your normal diet as instructed by your doctor.   Begin with a light meal and progress to your normal diet. Heavy or fried foods are harder to digest and may make you feel sick to your stomach (nauseated).   Avoid alcoholic beverages for 24 hours or as instructed.    MEDICATIONS  You may resume your normal medications.   WHAT YOU CAN EXPECT TODAY  Some feelings of bloating in the abdomen.   Passage of more gas than usual.   Spotting of blood in your stool or on the toilet paper  .  IF YOU HAD POLYPS REMOVED DURING THE COLONOSCOPY:  Eat a soft diet IF YOU HAVE NAUSEA, BLOATING,  ABDOMINAL PAIN, OR VOMITING.    FINDING OUT THE RESULTS OF YOUR TEST Not all test results are available during your visit. DR. Oneida Alar WILL CALL YOU WITHIN 7 DAYS OF YOUR PROCEDUE WITH YOUR RESULTS. Do not assume everything is normal if you have not heard from DR. FIELDS IN ONE WEEK, CALL HER OFFICE AT [phone].  SEEK IMMEDIATE MEDICAL ATTENTION AND CALL THE OFFICE: [phone] IF:  You have more than a spotting of blood in your stool.   Your belly is swollen (abdominal distention).   You are nauseated or vomiting.   You have a temperature over 101F.   You have abdominal pain or discomfort that is severe or gets worse throughout the day.  High-Fiber Diet A high-fiber diet changes your normal diet to include more whole grains, legumes, fruits, and vegetables. Changes in the diet involve replacing refined carbohydrates with unrefined foods. The calorie level of the diet is essentially unchanged. The Dietary Reference Intake (recommended amount) for adult males is 38 grams per day. For adult females, it is 25 grams per day. Pregnant and lactating women should consume 28 grams of fiber per day. Fiber is the intact part of a plant that is not broken down during digestion. Functional fiber is fiber that has been isolated from the plant to provide a beneficial effect in the body. PURPOSE  Increase stool bulk.   Ease and regulate bowel movements.   Lower cholesterol.  REDUCE  RISK OF COLON CANCER  INDICATIONS THAT YOU NEED MORE FIBER  Constipation and hemorrhoids.   Uncomplicated diverticulosis (intestine condition) and irritable bowel syndrome.   Weight management.   As a protective measure against hardening of the arteries (atherosclerosis), diabetes, and cancer.   GUIDELINES FOR INCREASING FIBER IN THE DIET  Start adding fiber to the diet slowly. A gradual increase of about 5 more grams (2 slices of whole-wheat bread, 2 servings of most fruits or vegetables, or 1 bowl of  high-fiber cereal) per day is best. Too rapid an increase in fiber may result in constipation, flatulence, and bloating.   Drink enough water and fluids to keep your urine clear or pale yellow. Water, juice, or caffeine-free drinks are recommended. Not drinking enough fluid may cause constipation.   Eat a variety of high-fiber foods rather than one type of fiber.   Try to increase your intake of fiber through using high-fiber foods rather than fiber pills or supplements that contain small amounts of fiber.   The goal is to change the types of food eaten. Do not supplement your present diet with high-fiber foods, but replace foods in your present diet.    INCLUDE A VARIETY OF FIBER SOURCES  Replace refined and processed grains with whole grains, canned fruits with fresh fruits, and incorporate other fiber sources. White rice, white breads, and most bakery goods contain little or no fiber.   Brown whole-grain rice, buckwheat oats, and many fruits and vegetables are all good sources of fiber. These include: broccoli, Brussels sprouts, cabbage, cauliflower, beets, sweet potatoes, white potatoes (skin on), carrots, tomatoes, eggplant, squash, berries, fresh fruits, and dried fruits.   Cereals appear to be the richest source of fiber. Cereal fiber is found in whole grains and bran. Bran is the fiber-rich outer coat of cereal grain, which is largely removed in refining. In whole-grain cereals, the bran remains. In breakfast cereals, the largest amount of fiber is found in those with "bran" in their names. The fiber content is sometimes indicated on the label.   You may need to include additional fruits and vegetables each day.   In baking, for 1 cup white flour, you may use the following substitutions:   1 cup whole-wheat flour minus 2 tablespoons.   1/2 cup white flour plus 1/2 cup whole-wheat flour.   Diverticulosis Diverticulosis is a common condition that develops when small pouches  (diverticula) form in the wall of the colon. The risk of diverticulosis increases with age. It happens more often in people who eat a low-fiber diet. Most individuals with diverticulosis have no symptoms. Those individuals with symptoms usually experience belly (abdominal) pain, constipation, or loose stools (diarrhea).  HOME CARE INSTRUCTIONS  Increase the amount of fiber in your diet as directed by your caregiver or dietician. This may reduce symptoms of diverticulosis.   Drink at least 6 to 8 glasses of water each day to prevent constipation.   Try not to strain when you have a bowel movement.   Avoiding nuts and seeds to prevent complications is NOT NECESSARY.   FOODS HAVING HIGH FIBER CONTENT INCLUDE:  Fruits. Apple, peach, pear, tangerine, raisins, prunes.   Vegetables. Brussels sprouts, asparagus, broccoli, cabbage, carrot, cauliflower, romaine lettuce, spinach, summer squash, tomato, winter squash, zucchini.   Starchy Vegetables. Baked beans, kidney beans, lima beans, split peas, lentils, potatoes (with skin).   Grains. Whole wheat bread, brown rice, bran flake cereal, plain oatmeal, white rice, shredded wheat, bran muffins.   SEEK IMMEDIATE  MEDICAL CARE IF:  You develop increasing pain or severe bloating.   You have an oral temperature above 101F.   You develop vomiting or bowel movements that are bloody or black.   Hemorrhoids Hemorrhoids are dilated (enlarged) veins around the rectum. Sometimes clots will form in the veins. This makes them swollen and painful. These are called thrombosed hemorrhoids. Causes of hemorrhoids include:  Constipation.   Straining to have a bowel movement.   HEAVY LIFTING  HOME CARE INSTRUCTIONS  Eat a well balanced diet and drink 6 to 8 glasses of water every day to avoid constipation. You may also use a bulk laxative.   Avoid straining to have bowel movements.   Keep anal area dry and clean.   Do not use a donut shaped pillow or  sit on the toilet for long periods. This increases blood pooling and pain.   Move your bowels when your body has the urge; this will require less straining and will decrease pain and pressure.

## 2015-03-03 NOTE — H&P (Signed)
Primary Care Physician:  Alonza Bogus, MD Primary Gastroenterologist:  Dr. Oneida Alar  Pre-Procedure History & Physical: HPI:  [name] [name] is a 69 y.o. male here for [place].  Past Medical History  Diagnosis Date  . Hypertension   . Urinary frequency   . Low serum testosterone level   . Shortness of breath dyspnea   . Pneumonia 05/2014  . COPD (chronic obstructive pulmonary disease) [facility])     Past Surgical History  Procedure Laterality Date  . Hernia repair    . Appendectomy    . Video bronchoscopy N/A 06/27/2014    Procedure: VIDEO BRONCHOSCOPY;  Surgeon: Grace Isaac, MD;  Location: [place];  Service: Thoracic;  Laterality: N/A;  . Video assisted thoracoscopy Right 06/27/2014    Procedure: VIDEO ASSISTED THORACOSCOPY;  Surgeon: Grace Isaac, MD;  Location: [place];  Service: Thoracic;  Laterality: Right;  . Empyema drainage Right 06/27/2014    Procedure: EMPYEMA DRAINAGE;  Surgeon: Grace Isaac, MD;  Location: [place];  Service: Thoracic;  Laterality: Right;  . Varicose vein surgery    . Tee without cardioversion N/A 07/14/2014    Procedure: TRANSESOPHAGEAL ECHOCARDIOGRAM (TEE);  Surgeon: Sueanne Margarita, MD;  Location: [facility] ENDOSCOPY;  Service: Cardiovascular;  Laterality: N/A;  . Colonoscopy  2011    Tubular adenoma per patient    Prior to Admission medications   Medication Sig Start Date End Date Taking? Authorizing Provider  amLODipine (NORVASC) 2.5 MG tablet Take 1 tablet by mouth daily. 05/20/14  Yes Historical Provider, MD  aspirin EC 81 MG tablet Take 81 mg by mouth daily.   Yes Historical Provider, MD  Calcium Citrate-Vitamin D (CALCIUM CITRATE + D3 PO) Take 1 tablet by mouth daily.   Yes Historical Provider, MD  Lactobacillus (ACIDOPHILUS PROBIOTIC) 10 MG TABS Take 1 tablet by mouth daily.   Yes Historical Provider, MD  lisinopril (PRINIVIL,ZESTRIL) 20 MG tablet Take 1 tablet by mouth daily. 05/20/14  Yes Historical Provider, MD  Multiple  Vitamins-Minerals (MULTIVITAMINS THER. W/MINERALS) TABS tablet Take 1 tablet by mouth daily.   Yes Historical Provider, MD  Multiple Vitamins-Minerals (PRESERVISION AREDS 2 PO) Take 2 tablets by mouth daily.   Yes Historical Provider, MD  Omega-3 Fatty Acids (FISH OIL) 1200 MG CAPS Take 1 capsule by mouth daily.   Yes Historical Provider, MD  PHYTOSTEROLS PO Take 1 tablet by mouth daily.   Yes Historical Provider, MD  polyethylene glycol-electrolytes (NULYTELY/GOLYTELY) 420 G solution Take 4,000 mLs by mouth once. 02/05/15  Yes Carlis Stable, NP  tamsulosin (FLOMAX) 0.4 MG CAPS capsule Take 1 capsule by mouth daily. 05/30/14  Yes Historical Provider, MD  testosterone (ANDROGEL) 50 MG/5GM (1%) GEL Place 5 g onto the skin daily. 05/21/14  Yes Historical Provider, MD    Allergies as of 02/05/2015  . (No Known Allergies)    Family History  Problem Relation Age of Onset  . Tuberculosis Father   . Hypertension Father   . COPD Mother   . Hypertension Mother   . Colon cancer Neg Hx     Social History   Social History  . Marital Status: Married    Spouse Name: N/A  . Number of Children: N/A  . Years of Education: N/A   Occupational History  . Not on file.   Social History Main Topics  . Smoking status: Former Smoker    Types: Pipe    Quit date: 06/07/2014  . Smokeless tobacco: Never Used  . Alcohol  Use: 0.0 oz/week    0 Standard drinks or equivalent per week     Comment: Average 1-2 drinks of beer in the evening  . Drug Use: No  . Sexual Activity: Not on file   Other Topics Concern  . Not on file   Social History Narrative    Review of Systems: See HPI, otherwise negative ROS   Physical Exam: BP 134/63 mmHg  Pulse 65  Temp(Src) 97.9 F (36.6 C) (Oral)  Resp 17  Ht 6' (1.829 m)  Wt 190 lb (86.183 kg)  BMI 25.76 kg/m2  SpO2 99% General:   Alert,  pleasant and cooperative in NAD Head:  Normocephalic and atraumatic. Neck:  Supple; Lungs:  Clear throughout to  auscultation.    Heart:  Regular rate and rhythm. Abdomen:  Soft, nontender and nondistended. Normal bowel sounds, without guarding, and without rebound.   Neurologic:  Alert and  oriented x4;  grossly normal neurologically.  Impression/Plan:    PERSONAL HISTORY OF POLYPS.  PLAN:  1. TCS TODAY

## 2015-03-05 ENCOUNTER — Encounter (HOSPITAL_COMMUNITY): Payer: Self-pay | Admitting: Gastroenterology

## 2015-03-06 DIAGNOSIS — H52223 Regular astigmatism, bilateral: Secondary | ICD-10-CM | POA: Diagnosis not present

## 2015-03-06 DIAGNOSIS — H5201 Hypermetropia, right eye: Secondary | ICD-10-CM | POA: Diagnosis not present

## 2015-03-06 DIAGNOSIS — H04129 Dry eye syndrome of unspecified lacrimal gland: Secondary | ICD-10-CM | POA: Diagnosis not present

## 2015-03-06 DIAGNOSIS — H524 Presbyopia: Secondary | ICD-10-CM | POA: Diagnosis not present

## 2015-04-20 ENCOUNTER — Encounter: Payer: Self-pay | Admitting: *Deleted

## 2015-06-02 DIAGNOSIS — I10 Essential (primary) hypertension: Secondary | ICD-10-CM | POA: Diagnosis not present

## 2015-06-02 DIAGNOSIS — B351 Tinea unguium: Secondary | ICD-10-CM | POA: Diagnosis not present

## 2015-06-02 DIAGNOSIS — N401 Enlarged prostate with lower urinary tract symptoms: Secondary | ICD-10-CM | POA: Diagnosis not present

## 2015-06-02 DIAGNOSIS — J449 Chronic obstructive pulmonary disease, unspecified: Secondary | ICD-10-CM | POA: Diagnosis not present

## 2015-10-21 ENCOUNTER — Other Ambulatory Visit (HOSPITAL_COMMUNITY): Payer: Self-pay | Admitting: Pulmonary Disease

## 2015-10-21 DIAGNOSIS — R911 Solitary pulmonary nodule: Secondary | ICD-10-CM

## 2015-10-28 ENCOUNTER — Ambulatory Visit (HOSPITAL_COMMUNITY)
Admission: RE | Admit: 2015-10-28 | Discharge: 2015-10-28 | Disposition: A | Discharge status: Home / Self Care | Payer: Medicare Other | Source: Ambulatory Visit | Attending: Pulmonary Disease | Admitting: Pulmonary Disease

## 2015-10-28 DIAGNOSIS — I2510 Atherosclerotic heart disease of native coronary artery without angina pectoris: Secondary | ICD-10-CM | POA: Insufficient documentation

## 2015-10-28 DIAGNOSIS — J439 Emphysema, unspecified: Secondary | ICD-10-CM | POA: Insufficient documentation

## 2015-10-28 DIAGNOSIS — J90 Pleural effusion, not elsewhere classified: Secondary | ICD-10-CM | POA: Diagnosis not present

## 2015-10-28 DIAGNOSIS — I700 Atherosclerosis of aorta: Secondary | ICD-10-CM | POA: Insufficient documentation

## 2015-10-28 DIAGNOSIS — R911 Solitary pulmonary nodule: Secondary | ICD-10-CM | POA: Diagnosis not present

## 2015-10-29 DIAGNOSIS — Z23 Encounter for immunization: Secondary | ICD-10-CM | POA: Diagnosis not present

## 2015-11-04 DIAGNOSIS — I38 Endocarditis, valve unspecified: Secondary | ICD-10-CM | POA: Diagnosis not present

## 2015-11-04 DIAGNOSIS — E785 Hyperlipidemia, unspecified: Secondary | ICD-10-CM | POA: Diagnosis not present

## 2015-11-04 DIAGNOSIS — I10 Essential (primary) hypertension: Secondary | ICD-10-CM | POA: Diagnosis not present

## 2015-11-04 DIAGNOSIS — J449 Chronic obstructive pulmonary disease, unspecified: Secondary | ICD-10-CM | POA: Diagnosis not present

## 2016-01-29 DIAGNOSIS — B351 Tinea unguium: Secondary | ICD-10-CM | POA: Diagnosis not present

## 2016-01-29 DIAGNOSIS — B353 Tinea pedis: Secondary | ICD-10-CM | POA: Diagnosis not present

## 2016-03-03 DIAGNOSIS — Z125 Encounter for screening for malignant neoplasm of prostate: Secondary | ICD-10-CM | POA: Diagnosis not present

## 2016-03-03 DIAGNOSIS — J449 Chronic obstructive pulmonary disease, unspecified: Secondary | ICD-10-CM | POA: Diagnosis not present

## 2016-03-03 DIAGNOSIS — I38 Endocarditis, valve unspecified: Secondary | ICD-10-CM | POA: Diagnosis not present

## 2016-03-03 DIAGNOSIS — E785 Hyperlipidemia, unspecified: Secondary | ICD-10-CM | POA: Diagnosis not present

## 2016-03-03 DIAGNOSIS — I10 Essential (primary) hypertension: Secondary | ICD-10-CM | POA: Diagnosis not present

## 2016-03-07 DIAGNOSIS — I120 Hypertensive chronic kidney disease with stage 5 chronic kidney disease or end stage renal disease: Secondary | ICD-10-CM | POA: Diagnosis not present

## 2016-03-07 DIAGNOSIS — E785 Hyperlipidemia, unspecified: Secondary | ICD-10-CM | POA: Diagnosis not present

## 2016-03-07 DIAGNOSIS — I10 Essential (primary) hypertension: Secondary | ICD-10-CM | POA: Diagnosis not present

## 2016-03-07 DIAGNOSIS — J449 Chronic obstructive pulmonary disease, unspecified: Secondary | ICD-10-CM | POA: Diagnosis not present

## 2016-03-08 DIAGNOSIS — H52223 Regular astigmatism, bilateral: Secondary | ICD-10-CM | POA: Diagnosis not present

## 2016-03-08 DIAGNOSIS — H5203 Hypermetropia, bilateral: Secondary | ICD-10-CM | POA: Diagnosis not present

## 2016-03-08 DIAGNOSIS — H43813 Vitreous degeneration, bilateral: Secondary | ICD-10-CM | POA: Diagnosis not present

## 2016-03-08 DIAGNOSIS — H524 Presbyopia: Secondary | ICD-10-CM | POA: Diagnosis not present

## 2016-03-29 DIAGNOSIS — R102 Pelvic and perineal pain: Secondary | ICD-10-CM | POA: Diagnosis not present

## 2016-03-29 DIAGNOSIS — R319 Hematuria, unspecified: Secondary | ICD-10-CM | POA: Diagnosis not present

## 2016-05-03 DIAGNOSIS — B351 Tinea unguium: Secondary | ICD-10-CM | POA: Diagnosis not present

## 2016-05-03 DIAGNOSIS — B353 Tinea pedis: Secondary | ICD-10-CM | POA: Diagnosis not present

## 2016-06-02 DIAGNOSIS — E785 Hyperlipidemia, unspecified: Secondary | ICD-10-CM | POA: Diagnosis not present

## 2016-06-02 DIAGNOSIS — J449 Chronic obstructive pulmonary disease, unspecified: Secondary | ICD-10-CM | POA: Diagnosis not present

## 2016-06-02 DIAGNOSIS — I120 Hypertensive chronic kidney disease with stage 5 chronic kidney disease or end stage renal disease: Secondary | ICD-10-CM | POA: Diagnosis not present

## 2016-06-02 DIAGNOSIS — I10 Essential (primary) hypertension: Secondary | ICD-10-CM | POA: Diagnosis not present

## 2016-06-02 DIAGNOSIS — N183 Chronic kidney disease, stage 3 (moderate): Secondary | ICD-10-CM | POA: Diagnosis not present

## 2016-06-07 DIAGNOSIS — N401 Enlarged prostate with lower urinary tract symptoms: Secondary | ICD-10-CM | POA: Diagnosis not present

## 2016-06-07 DIAGNOSIS — J449 Chronic obstructive pulmonary disease, unspecified: Secondary | ICD-10-CM | POA: Diagnosis not present

## 2016-06-07 DIAGNOSIS — N183 Chronic kidney disease, stage 3 (moderate): Secondary | ICD-10-CM | POA: Diagnosis not present

## 2016-06-07 DIAGNOSIS — I10 Essential (primary) hypertension: Secondary | ICD-10-CM | POA: Diagnosis not present

## 2016-06-14 DIAGNOSIS — D225 Melanocytic nevi of trunk: Secondary | ICD-10-CM | POA: Diagnosis not present

## 2016-06-14 DIAGNOSIS — X32XXXA Exposure to sunlight, initial encounter: Secondary | ICD-10-CM | POA: Diagnosis not present

## 2016-06-14 DIAGNOSIS — L570 Actinic keratosis: Secondary | ICD-10-CM | POA: Diagnosis not present

## 2016-10-21 DIAGNOSIS — Z0000 Encounter for general adult medical examination without abnormal findings: Secondary | ICD-10-CM | POA: Diagnosis not present

## 2016-10-21 DIAGNOSIS — J449 Chronic obstructive pulmonary disease, unspecified: Secondary | ICD-10-CM | POA: Diagnosis not present

## 2016-10-21 DIAGNOSIS — I120 Hypertensive chronic kidney disease with stage 5 chronic kidney disease or end stage renal disease: Secondary | ICD-10-CM | POA: Diagnosis not present

## 2016-10-21 DIAGNOSIS — I10 Essential (primary) hypertension: Secondary | ICD-10-CM | POA: Diagnosis not present

## 2016-10-21 DIAGNOSIS — Z125 Encounter for screening for malignant neoplasm of prostate: Secondary | ICD-10-CM | POA: Diagnosis not present

## 2016-10-21 DIAGNOSIS — N183 Chronic kidney disease, stage 3 (moderate): Secondary | ICD-10-CM | POA: Diagnosis not present

## 2016-10-21 DIAGNOSIS — N401 Enlarged prostate with lower urinary tract symptoms: Secondary | ICD-10-CM | POA: Diagnosis not present

## 2016-10-24 ENCOUNTER — Other Ambulatory Visit (HOSPITAL_COMMUNITY): Payer: Self-pay | Admitting: Pulmonary Disease

## 2016-10-24 ENCOUNTER — Ambulatory Visit (HOSPITAL_COMMUNITY)
Admission: RE | Admit: 2016-10-24 | Discharge: 2016-10-24 | Disposition: A | Discharge status: Home / Self Care | Payer: Medicare Other | Source: Ambulatory Visit | Attending: Pulmonary Disease | Admitting: Pulmonary Disease

## 2016-10-24 DIAGNOSIS — I700 Atherosclerosis of aorta: Secondary | ICD-10-CM | POA: Diagnosis not present

## 2016-10-24 DIAGNOSIS — M47896 Other spondylosis, lumbar region: Secondary | ICD-10-CM | POA: Insufficient documentation

## 2016-10-24 DIAGNOSIS — M545 Low back pain: Secondary | ICD-10-CM

## 2016-10-24 DIAGNOSIS — E785 Hyperlipidemia, unspecified: Secondary | ICD-10-CM | POA: Diagnosis not present

## 2016-10-24 DIAGNOSIS — I10 Essential (primary) hypertension: Secondary | ICD-10-CM | POA: Diagnosis not present

## 2016-10-24 DIAGNOSIS — I708 Atherosclerosis of other arteries: Secondary | ICD-10-CM | POA: Diagnosis not present

## 2016-10-25 DIAGNOSIS — B351 Tinea unguium: Secondary | ICD-10-CM | POA: Diagnosis not present

## 2016-10-25 DIAGNOSIS — B353 Tinea pedis: Secondary | ICD-10-CM | POA: Diagnosis not present

## 2016-10-31 ENCOUNTER — Other Ambulatory Visit (HOSPITAL_COMMUNITY): Payer: Self-pay | Admitting: Pulmonary Disease

## 2016-10-31 DIAGNOSIS — M25552 Pain in left hip: Secondary | ICD-10-CM

## 2016-10-31 DIAGNOSIS — M545 Low back pain, unspecified: Secondary | ICD-10-CM

## 2016-11-08 ENCOUNTER — Ambulatory Visit (HOSPITAL_COMMUNITY)
Admission: RE | Admit: 2016-11-08 | Discharge: 2016-11-08 | Disposition: A | Discharge status: Home / Self Care | Payer: Medicare Other | Source: Ambulatory Visit | Attending: Pulmonary Disease | Admitting: Pulmonary Disease

## 2016-11-08 DIAGNOSIS — M545 Low back pain, unspecified: Secondary | ICD-10-CM

## 2016-11-08 DIAGNOSIS — M5186 Other intervertebral disc disorders, lumbar region: Secondary | ICD-10-CM | POA: Insufficient documentation

## 2016-11-08 DIAGNOSIS — Z23 Encounter for immunization: Secondary | ICD-10-CM | POA: Diagnosis not present

## 2016-11-08 DIAGNOSIS — M7138 Other bursal cyst, other site: Secondary | ICD-10-CM | POA: Diagnosis not present

## 2016-11-08 DIAGNOSIS — M5136 Other intervertebral disc degeneration, lumbar region: Secondary | ICD-10-CM | POA: Insufficient documentation

## 2016-11-08 DIAGNOSIS — M4316 Spondylolisthesis, lumbar region: Secondary | ICD-10-CM | POA: Insufficient documentation

## 2016-11-08 DIAGNOSIS — M25552 Pain in left hip: Secondary | ICD-10-CM | POA: Diagnosis not present

## 2016-11-08 DIAGNOSIS — M48061 Spinal stenosis, lumbar region without neurogenic claudication: Secondary | ICD-10-CM | POA: Diagnosis not present

## 2016-11-10 IMAGING — CR DG CHEST 1V PORT
1 series · 1 of 1 positions shown · non-contrast
Comparison: None

CLINICAL DATA: PATIENT ARRIEVED TO ER BY EMS, RIGHT SIDE/FLANK PAIN
STARTED TODAY AND SOB TODAY.. BODY ACHES X 1 WEEK. LEFT SHOULDER
PAIN, NAUSEA YESTERDAY. PATIENT STATES " HURTS TO BREATHE" STATES "
HE SMOKES A PIPE" HISTORY OF HTN

EXAM:
PORTABLE CHEST - 1 VIEW

[portable]
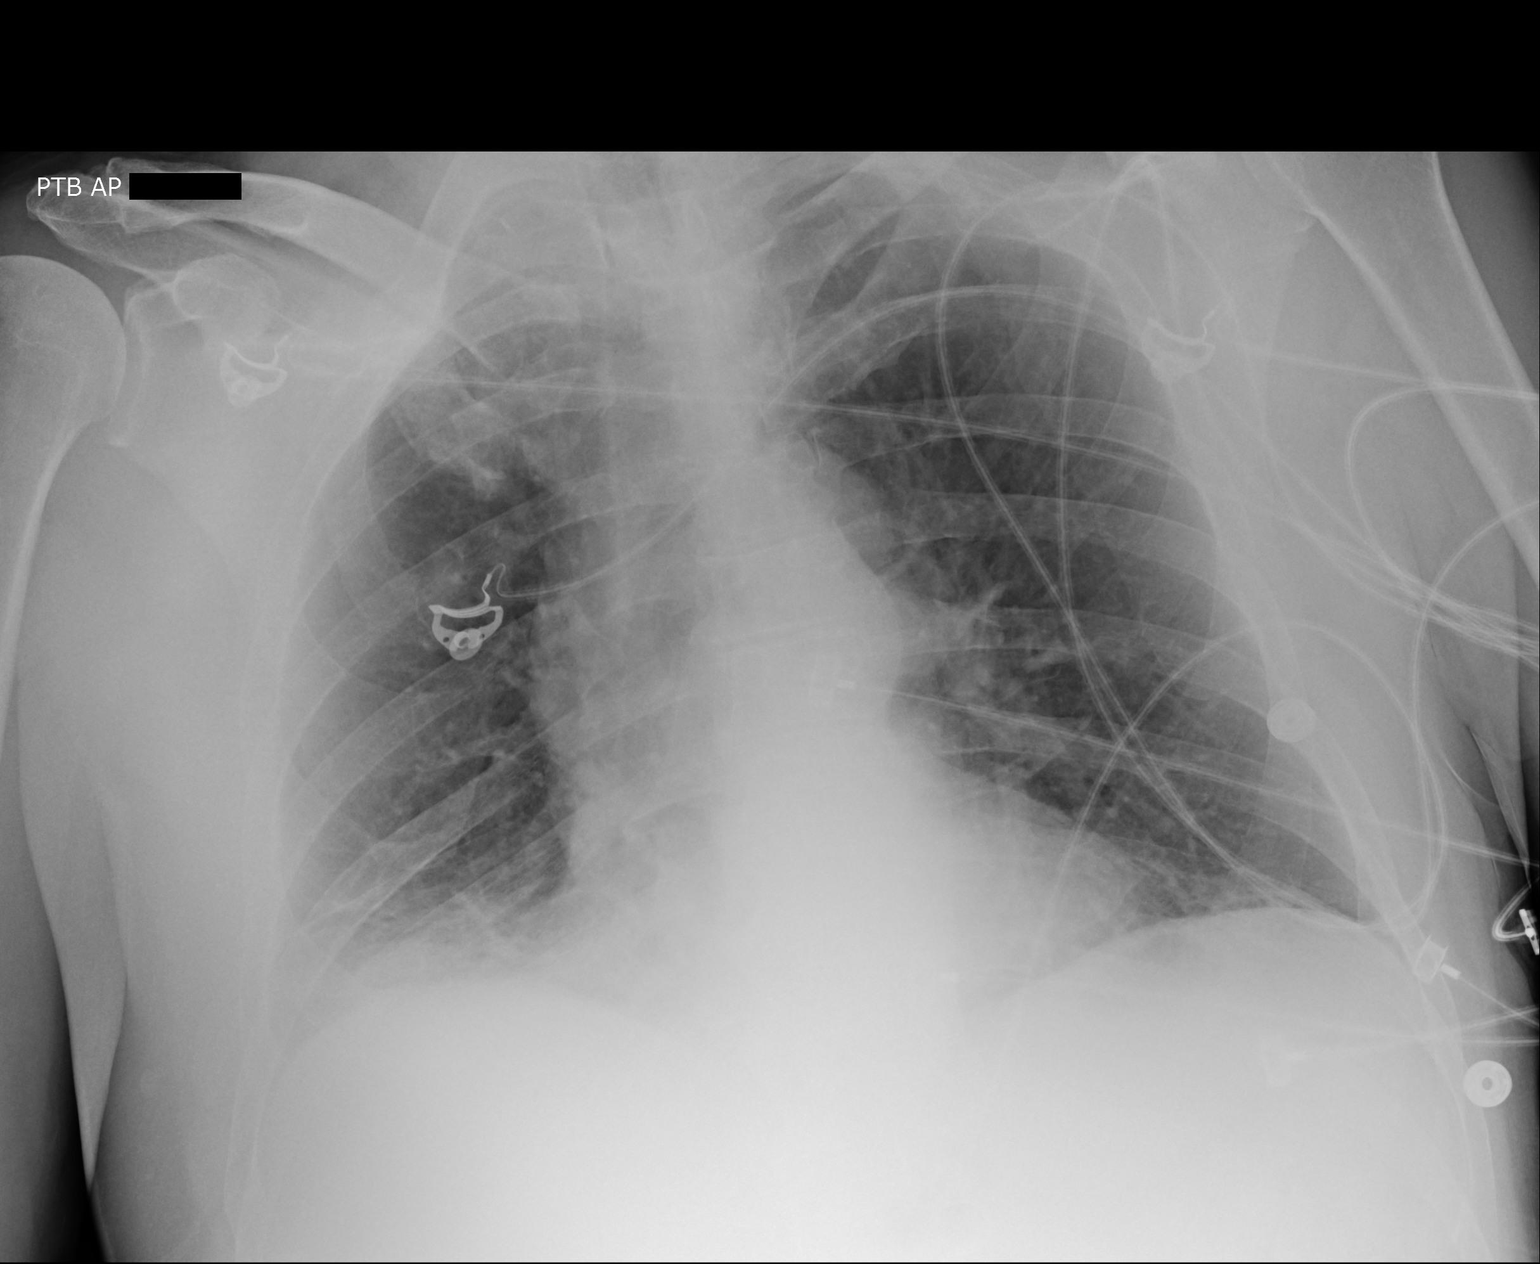

[1 of 1 positions shown; findings below may reference images not displayed]

FINDINGS: There is basilar opacity, most evident on right, which may all be
atelectasis. Pneumonia is possible. There is no evidence of
pulmonary edema. No pleural effusion or pneumothorax.

Cardiac silhouette is normal in size. No mediastinal or hilar
masses. Bony thorax is demineralized but grossly intact.
IMPRESSION: Right greater than left lung base opacity. Atelectasis/scarring
suspected. Infiltrate is possible. No pulmonary edema.

## 2016-11-15 IMAGING — DX DG CHEST 2V
2 series · 2 of 2 positions shown · non-contrast
Comparison: Portable chest x-ray 06/07/2014 and CT abdomen and
pelvis of the same day

CLINICAL DATA: History pneumonia, followup, fever

EXAM:
CHEST  2 VIEW

[chest pa]
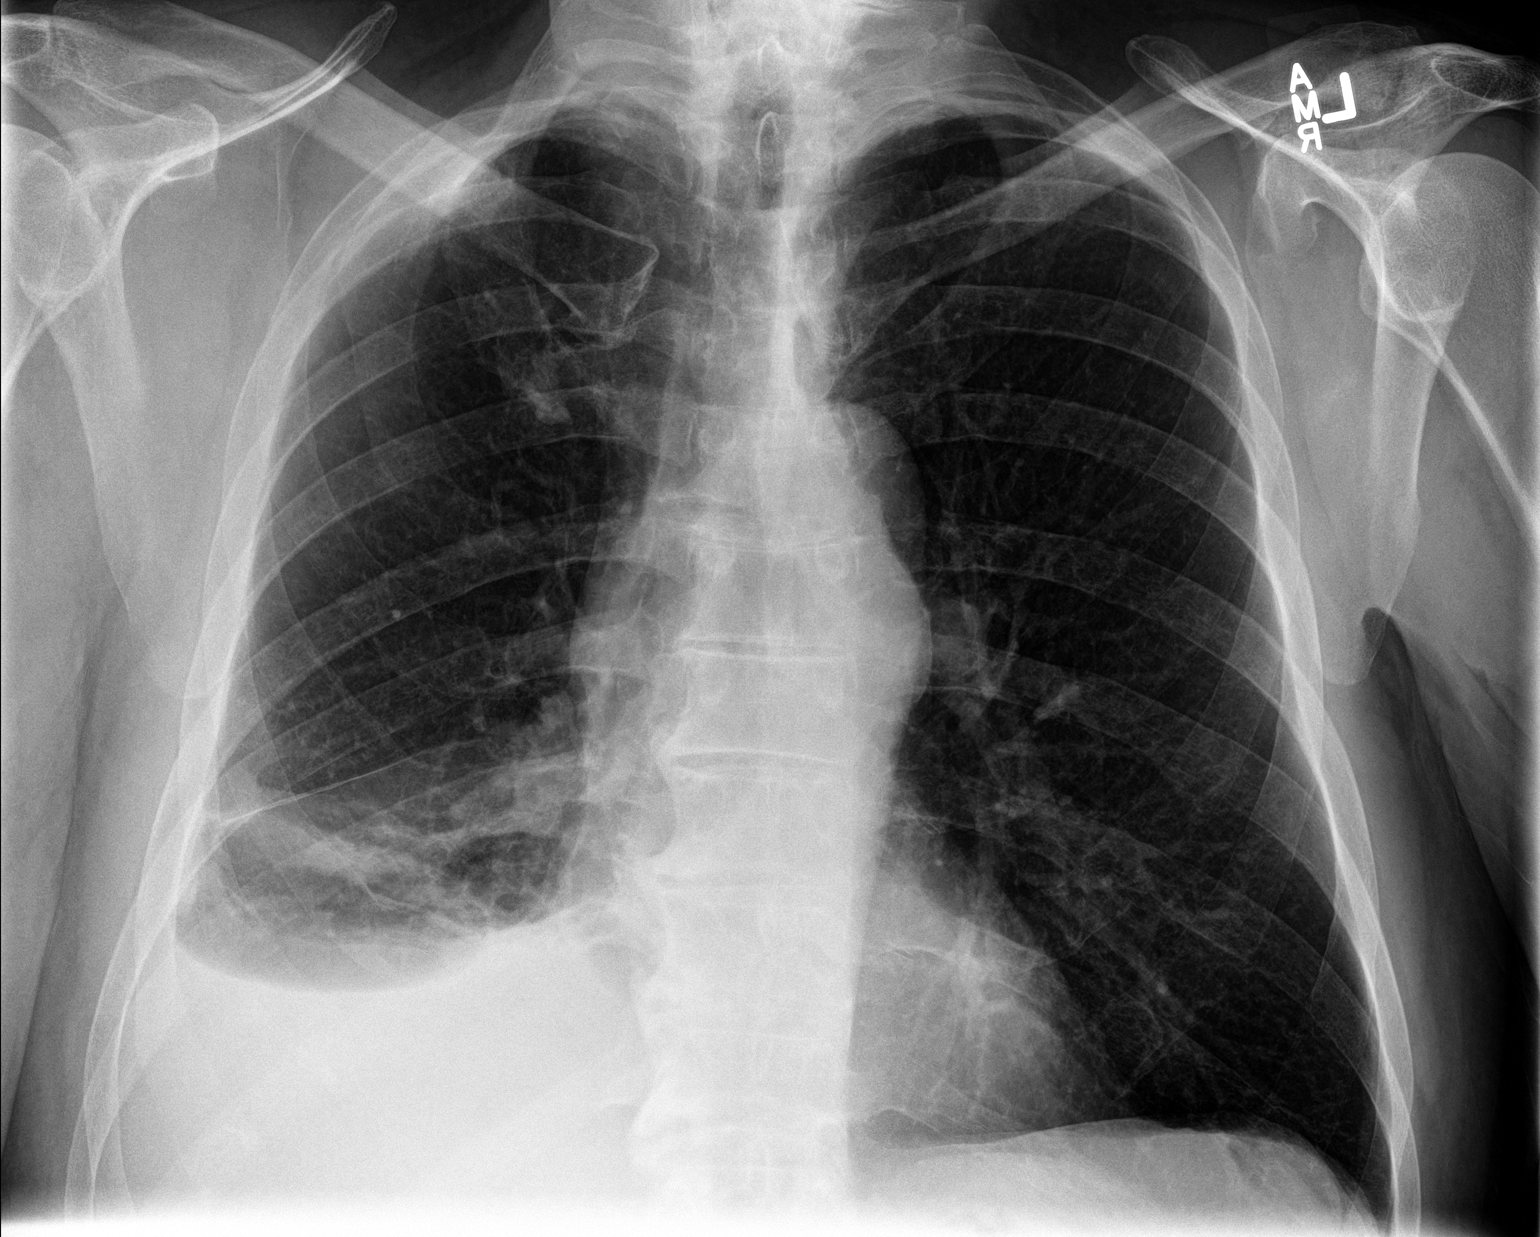

[chest lat]
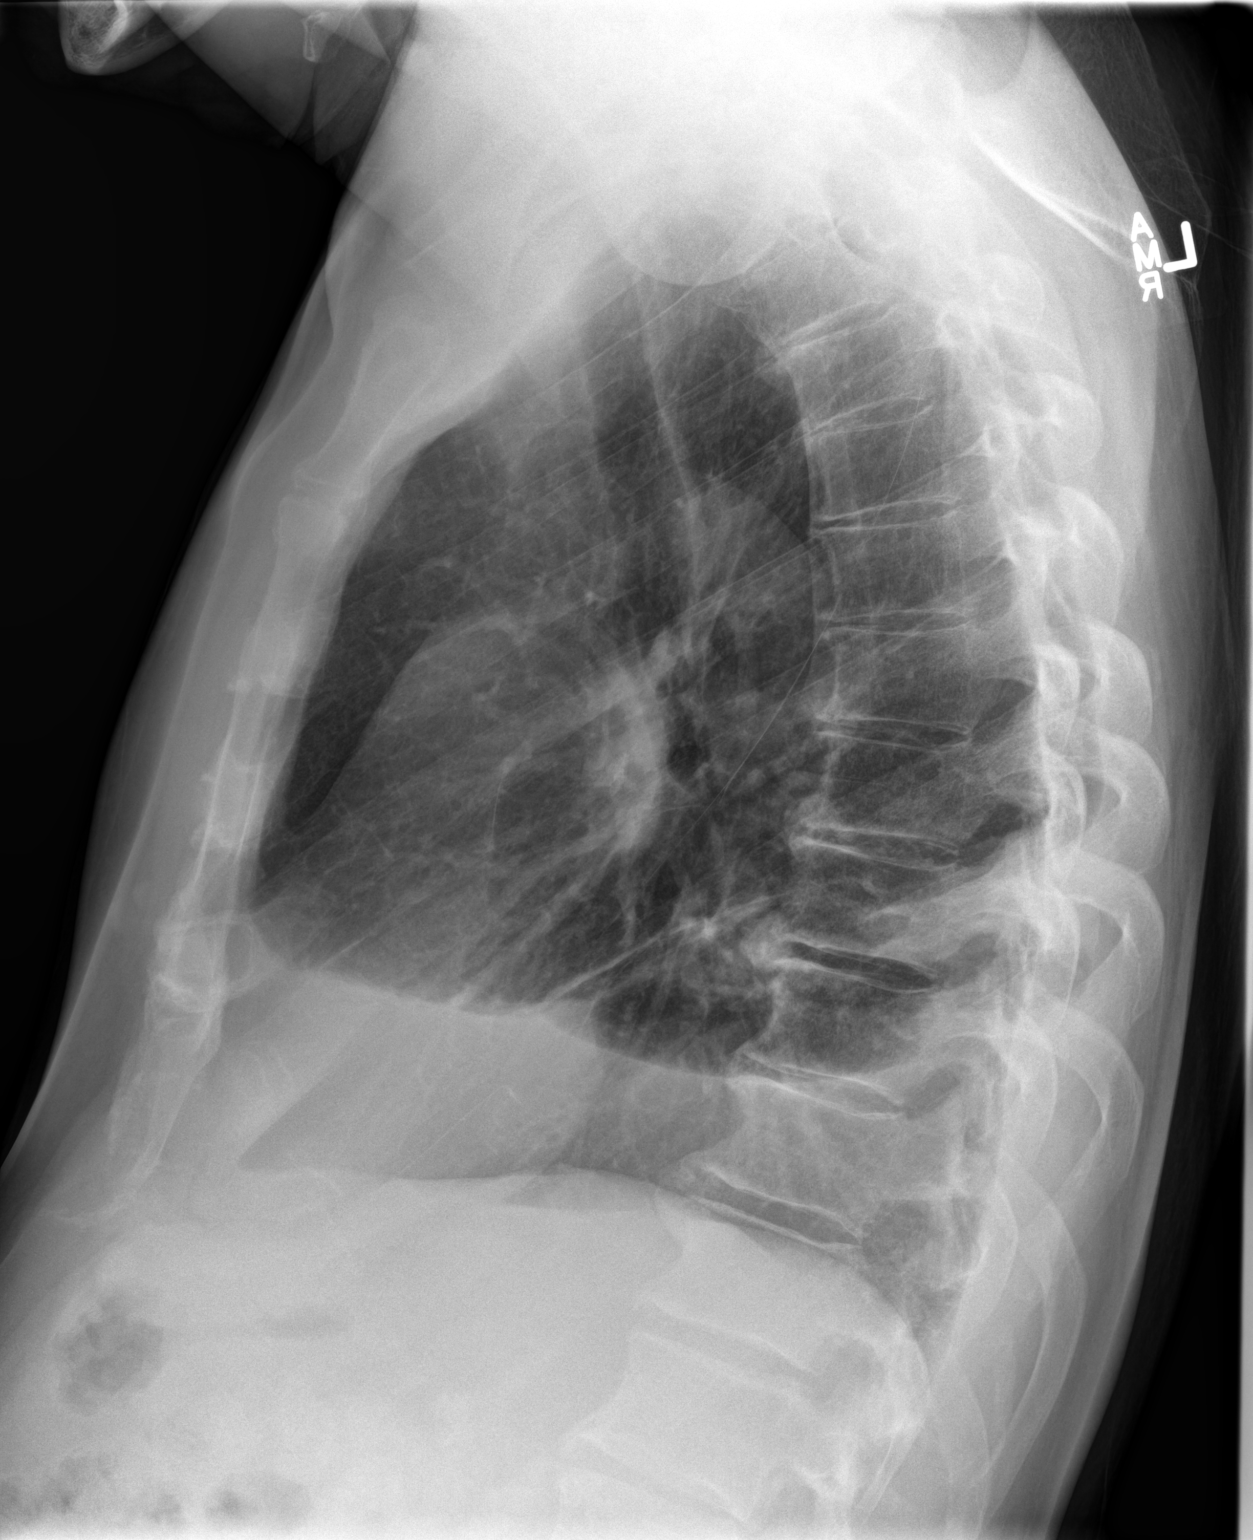

[2 of 2 positions shown; findings below may reference images not displayed]

FINDINGS: The right lower lobe parenchymal opacity and small right pleural
effusion noted on CT of the abdomen pelvis appears to remain with
persistent pleural and parenchymal opacity at the right lung base.
The left lung is clear. Mediastinal and hilar contours are
unremarkable. The bulging of the left mediastinal contour is
unchanged and probably is due to a prominent pulmonary artery
segment. The heart is unchanged in size. There are degenerative
changes in the lower thoracic spine.
IMPRESSION: Persistent pleural and parenchymal opacity at the right lung base
most consistent with atelectasis, pneumonia, and right pleural
effusion.

## 2016-11-24 DIAGNOSIS — M7138 Other bursal cyst, other site: Secondary | ICD-10-CM | POA: Diagnosis not present

## 2016-11-24 DIAGNOSIS — M4727 Other spondylosis with radiculopathy, lumbosacral region: Secondary | ICD-10-CM | POA: Diagnosis not present

## 2016-11-24 DIAGNOSIS — M5416 Radiculopathy, lumbar region: Secondary | ICD-10-CM | POA: Diagnosis not present

## 2016-11-24 DIAGNOSIS — M4317 Spondylolisthesis, lumbosacral region: Secondary | ICD-10-CM | POA: Diagnosis not present

## 2016-12-05 IMAGING — DX DG CHEST 1V PORT
1 series · 2 of 2 positions shown · non-contrast
Comparison: 07/01/2014

CLINICAL DATA: Empyema, right chest tube, cough

EXAM:
PORTABLE CHEST - 1 VIEW

[Series 1: ap portable · 0.17mm/px · 2 of 2 slices shown]
[im 1/2]
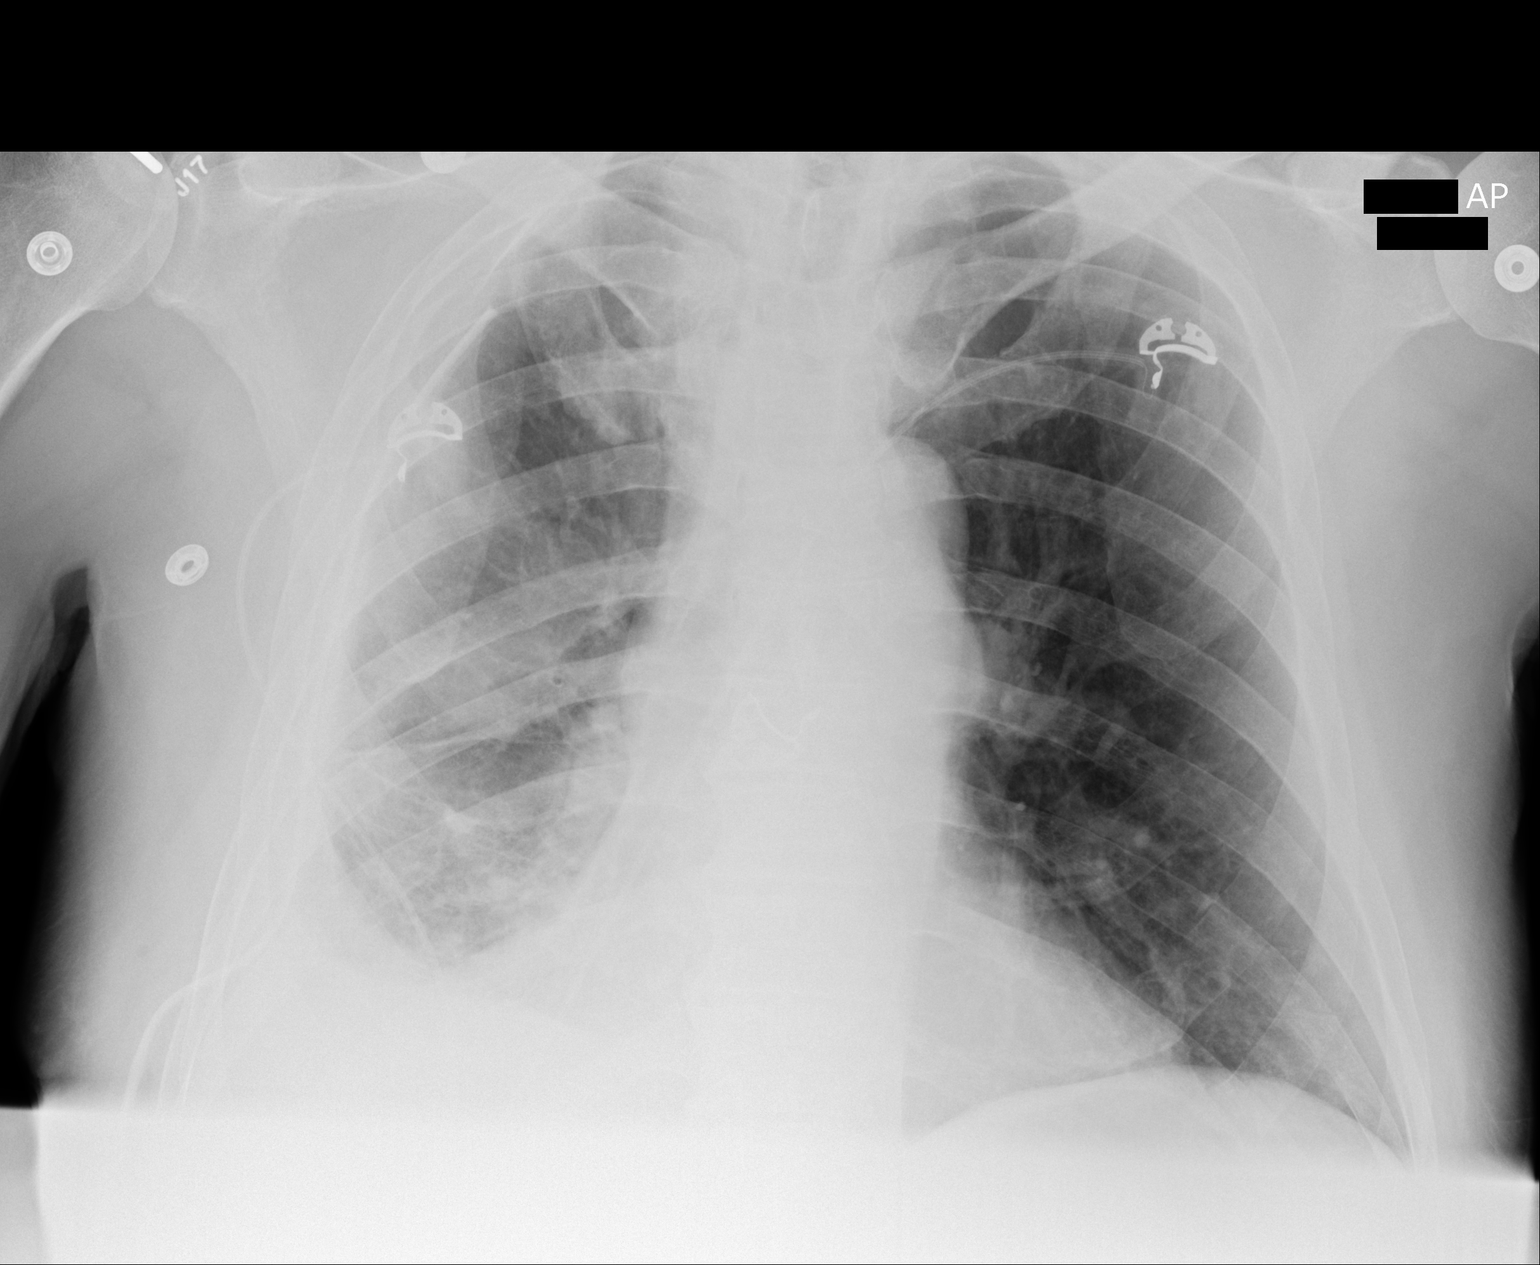
[im 2/2]
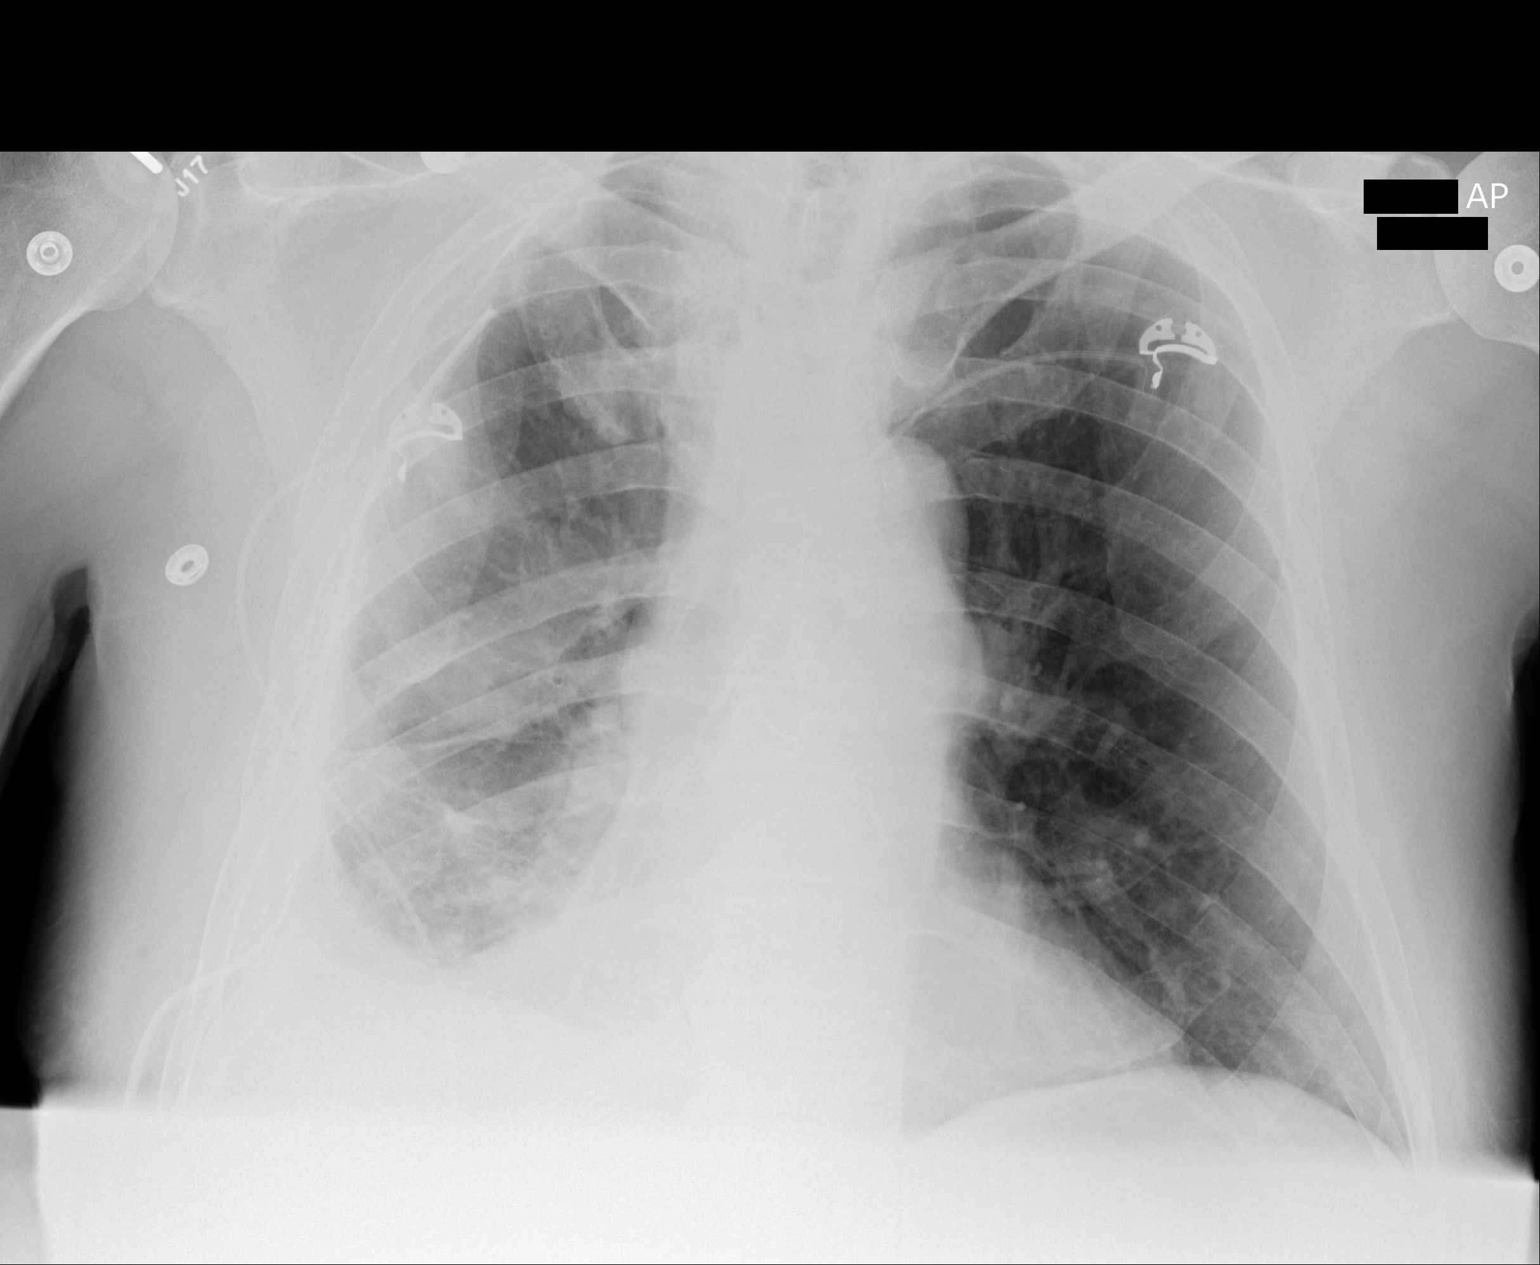

[2 of 2 positions shown; findings below may reference images not displayed]

FINDINGS: There is a lateral right chest tube in place, stable. Small right
pleural effusion. Patchy airspace disease throughout the right lung.
Left lung is clear. Heart is normal size.
IMPRESSION: Stable appearance of the chest with right chest tube in place. No
pneumothorax. Small right pleural effusion.

## 2016-12-12 IMAGING — CT CT CHEST W/O CM
2 of 4 series · 13 of 36 positions shown, 16 images · non-contrast
Comparison: X-ray 07/08/2014, CT chest 06/26/2014

CLINICAL DATA: 69-year-old male with a history of empyema and cough

EXAM:
CT CHEST WITHOUT CONTRAST
TECHNIQUE: Multidetector CT imaging of the chest was performed following the
standard protocol without IV contrast..

[Series 4: thins · axial · 0.76mm/px · z∈[+1289,+1606]mm · 10 of 480 slices shown, 13 images]
[im 42/480  mediastinal]
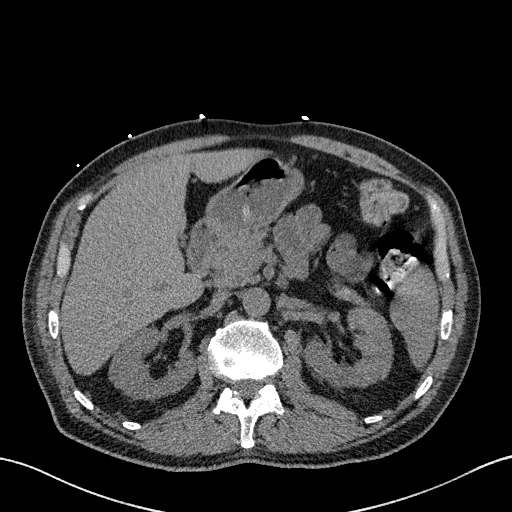
[im 42/480  lung]
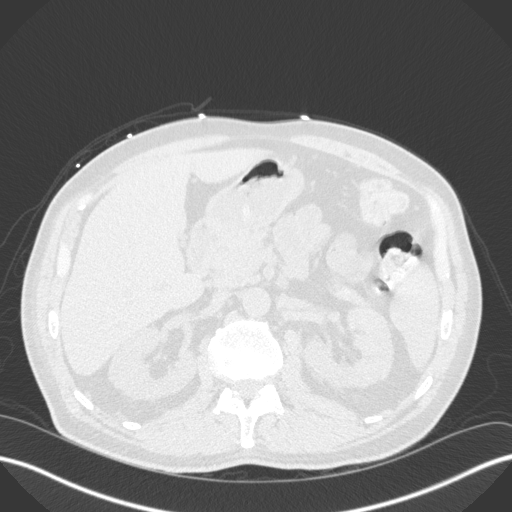
[im 84/480  lung]
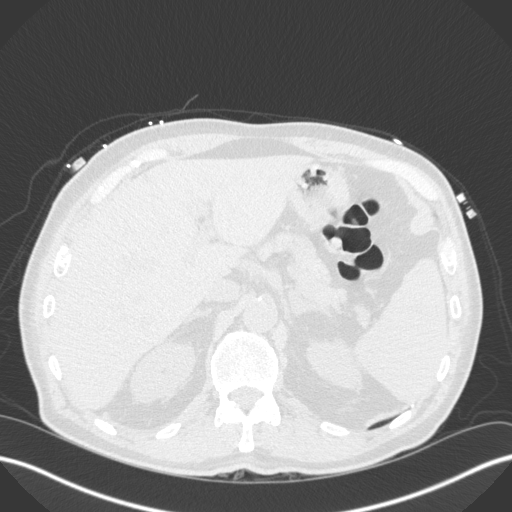
[im 125/480  lung]
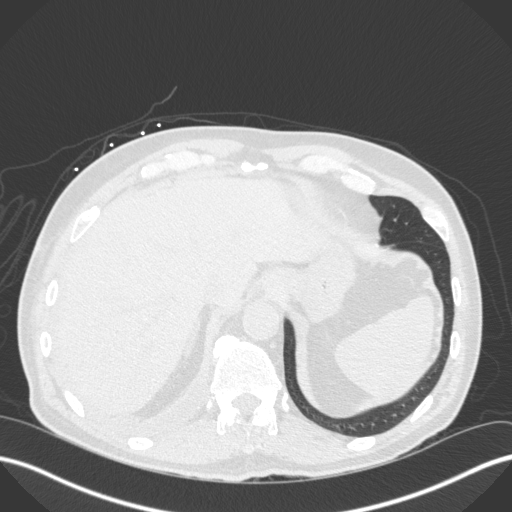
[im 167/480  lung]
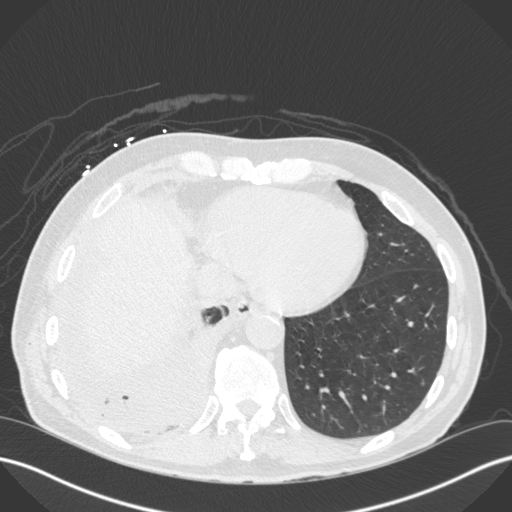
[im 209/480  mediastinal]
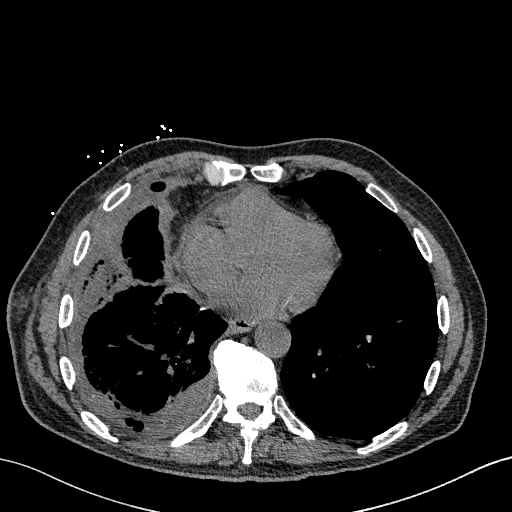
[im 209/480  lung]
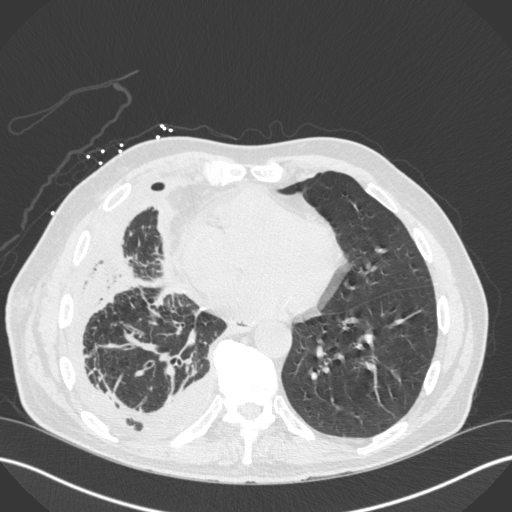
[im 271/480  lung]
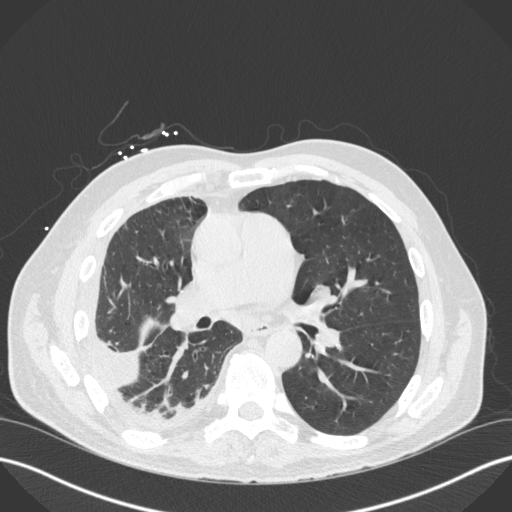
[im 313/480  lung]
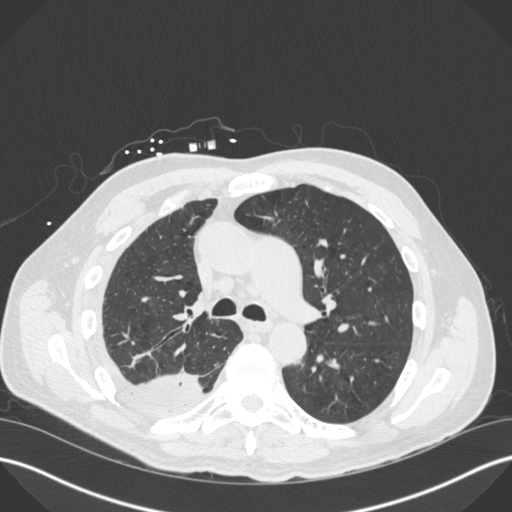
[im 355/480  lung]
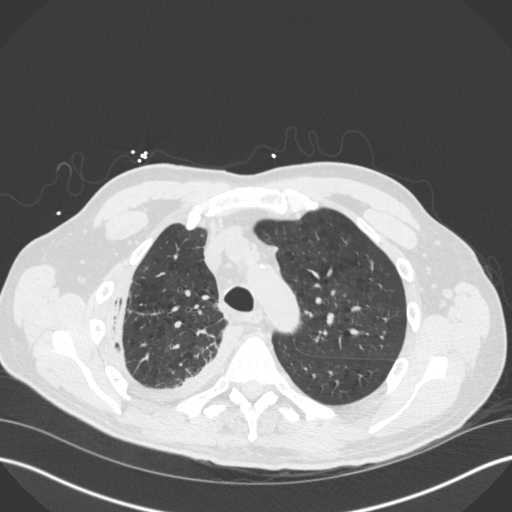
[im 396/480  mediastinal]
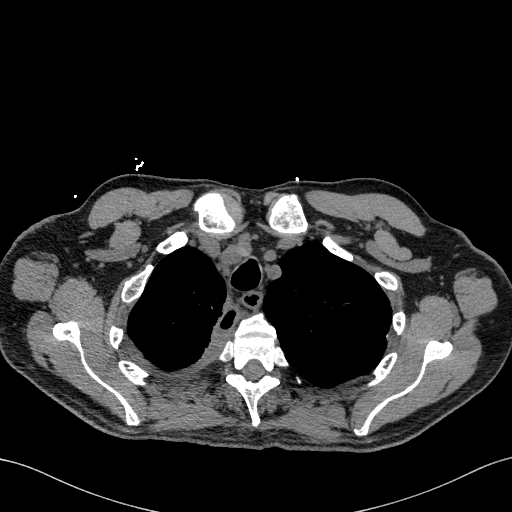
[im 396/480  lung]
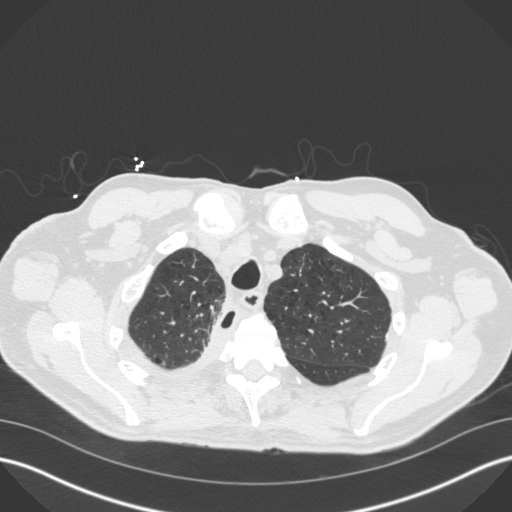
[im 438/480  lung]
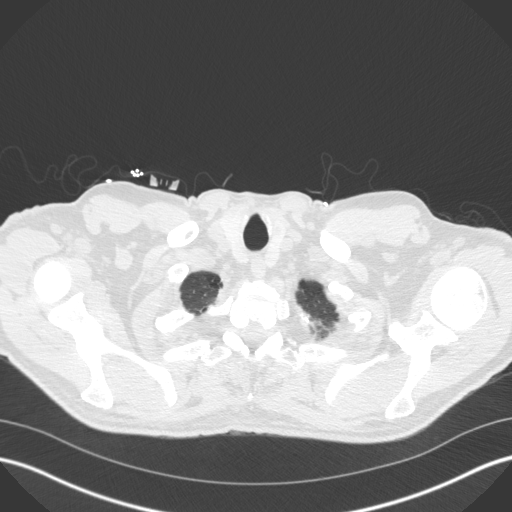

[Series 5: coronal · coronal · 0.71mm/px · 3 of 79 slices shown]
[im 16/79  lung]
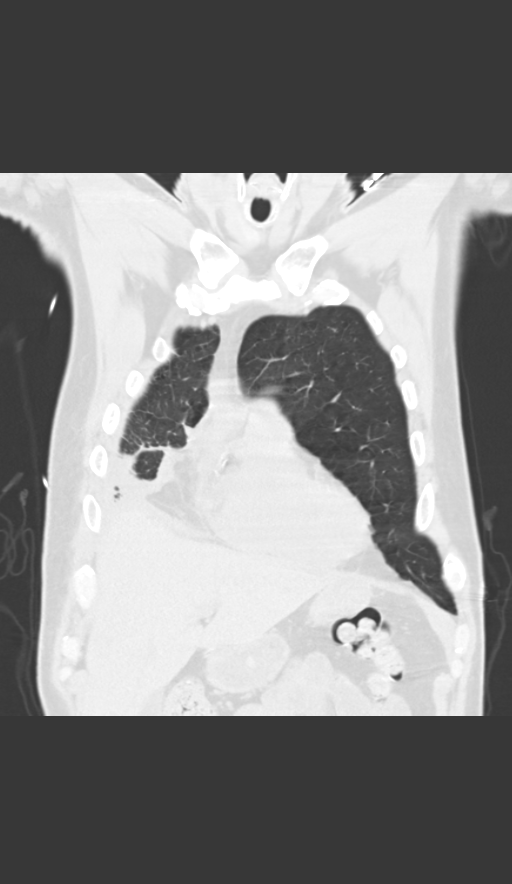
[im 32/79  lung]
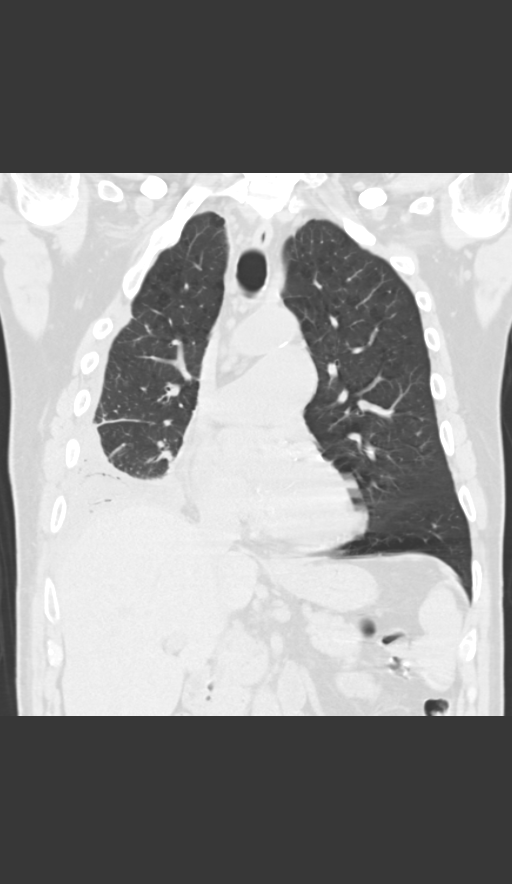
[im 47/79  lung]
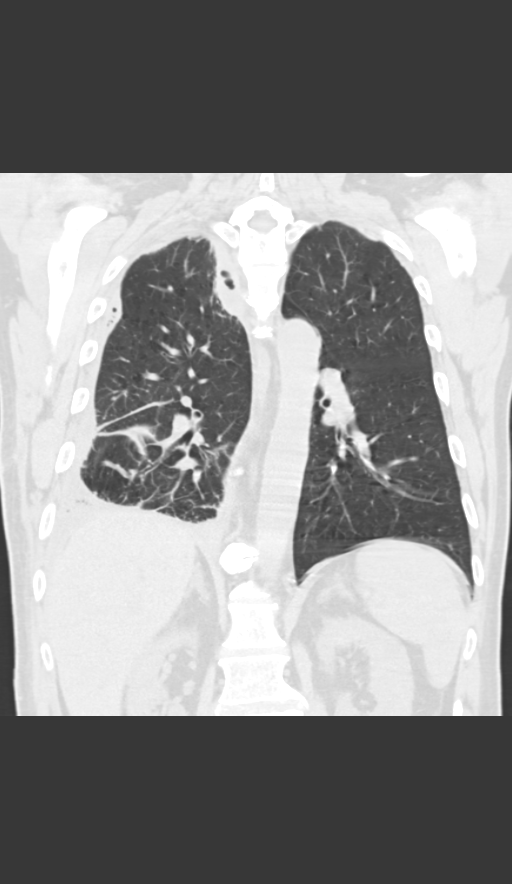

[13 of 36 positions shown; findings below may reference images not displayed]

FINDINGS: Chest:

Unremarkable appearance the superficial soft tissues of the chest
wall. No axillary adenopathy. No supraclavicular adenopathy.

Unremarkable appearance of the thoracic inlet. Unremarkable thyroid
gland.

Heart size within normal limits. Trace pericardial fluid/
thickening.

Calcifications of left main, left anterior descending, circumflex,
right coronary arteries.

Multiple mediastinal lymph nodes, similar in size in distribution to
the comparison, compatible with reactive lymph nodes. Index lymph
node in the right peritracheal nodal station measures 15 mm.

Calcifications of the thoracic aorta and branch vessels again noted.
No periaortic fluid.

Compared to the prior CT chest there is significantly decreased
volume of right pleural fluid, though there is persisting fluid with
internal air loculations, status post video-assisted thoracoscopy
and empyema drainage.

Improved aeration of right upper lobe, right middle lobe, and right
lower lobe.

Linear and ground-glass opacities of the right lobe likely represent
element of atelectasis/ scarring.

Residual fluid does not appear to involve the parenchyma of the
right lung. No cavitation of the right lung is identified.

Left lung is well aerated with no confluent airspace disease. No
left-sided pleural effusion.

Upper abdomen: Small hiatal hernia with otherwise unremarkable
appearance of the visualized upper abdomen.

Musculoskeletal:

No displaced fracture.

Mild degenerative changes of the spine.
IMPRESSION: Significantly decreased right-sided pleural fluid status post
video-assisted thoracoscopy and empyema drainage. There is residual
fluid with internal loculations, potentially postoperative in
nature.

Improved aeration of the right lung, with mixed atelectasis/
scarring and/or interstitial edema.

Reactive mediastinal adenopathy.

Mild atherosclerosis with evidence of coronary artery disease.

Additional incidental findings as above.

## 2017-01-23 DIAGNOSIS — I10 Essential (primary) hypertension: Secondary | ICD-10-CM | POA: Diagnosis not present

## 2017-01-23 DIAGNOSIS — E785 Hyperlipidemia, unspecified: Secondary | ICD-10-CM | POA: Diagnosis not present

## 2017-01-24 DIAGNOSIS — B351 Tinea unguium: Secondary | ICD-10-CM | POA: Diagnosis not present

## 2017-01-30 ENCOUNTER — Other Ambulatory Visit (HOSPITAL_COMMUNITY)
Admission: RE | Admit: 2017-01-30 | Discharge: 2017-01-30 | Disposition: A | Discharge status: Home / Self Care | Payer: Medicare Other | Source: Ambulatory Visit | Attending: Pulmonary Disease | Admitting: Pulmonary Disease

## 2017-01-30 DIAGNOSIS — I10 Essential (primary) hypertension: Secondary | ICD-10-CM | POA: Diagnosis not present

## 2017-01-30 DIAGNOSIS — I38 Endocarditis, valve unspecified: Secondary | ICD-10-CM | POA: Diagnosis not present

## 2017-01-30 DIAGNOSIS — J449 Chronic obstructive pulmonary disease, unspecified: Secondary | ICD-10-CM | POA: Insufficient documentation

## 2017-01-30 DIAGNOSIS — E785 Hyperlipidemia, unspecified: Secondary | ICD-10-CM | POA: Diagnosis not present

## 2017-01-31 ENCOUNTER — Telehealth: Payer: Self-pay | Admitting: Acute Care

## 2017-02-01 NOTE — Telephone Encounter (Signed)
Patient called again and was advised Langley Gauss is not in the office.  He asked her to please call the home number provided originally first, if cannot reach there, try cell at [phone].  He also asked that if she cannot reach him to please leave when she will be back in the office so that he may call back when she is here.

## 2017-02-03 NOTE — Telephone Encounter (Signed)
Spoke with pt regarding lung cancer screening.  I advised pt that I need to talke with Eric Form, NP our nurse navigator to see if he will qualify for the program.  Pt verbalized understanding.

## 2017-02-03 NOTE — Telephone Encounter (Signed)
Per pt request , I left a detailed message and explained that per Eric Form, NP, pipe smoking history does not count towards smoking history for the lung cancer screening program.  Pt will not qualify with a current history of 20 pack year cigarette history.  Letter sent to Dr [name] to make him aware.

## 2017-02-04 LAB — CULTURE, BLOOD (ROUTINE X 2)
CULTURE: NO GROWTH
Culture: NO GROWTH
Special Requests: ADEQUATE
Special Requests: ADEQUATE

## 2017-03-30 DIAGNOSIS — H43813 Vitreous degeneration, bilateral: Secondary | ICD-10-CM | POA: Diagnosis not present

## 2017-04-06 ENCOUNTER — Ambulatory Visit (HOSPITAL_COMMUNITY)
Admission: RE | Admit: 2017-04-06 | Discharge: 2017-04-06 | Disposition: A | Discharge status: Home / Self Care | Payer: Medicare Other | Source: Ambulatory Visit | Attending: Pulmonary Disease | Admitting: Pulmonary Disease

## 2017-04-06 ENCOUNTER — Other Ambulatory Visit (HOSPITAL_COMMUNITY): Payer: Self-pay | Admitting: Pulmonary Disease

## 2017-04-06 DIAGNOSIS — J441 Chronic obstructive pulmonary disease with (acute) exacerbation: Secondary | ICD-10-CM | POA: Diagnosis not present

## 2017-04-06 DIAGNOSIS — R911 Solitary pulmonary nodule: Secondary | ICD-10-CM

## 2017-04-06 DIAGNOSIS — J449 Chronic obstructive pulmonary disease, unspecified: Secondary | ICD-10-CM | POA: Insufficient documentation

## 2017-04-06 DIAGNOSIS — I10 Essential (primary) hypertension: Secondary | ICD-10-CM | POA: Diagnosis not present

## 2017-04-06 DIAGNOSIS — J189 Pneumonia, unspecified organism: Secondary | ICD-10-CM | POA: Diagnosis not present

## 2017-04-07 DIAGNOSIS — J189 Pneumonia, unspecified organism: Secondary | ICD-10-CM | POA: Diagnosis not present

## 2017-04-25 DIAGNOSIS — B351 Tinea unguium: Secondary | ICD-10-CM | POA: Diagnosis not present

## 2017-05-16 DIAGNOSIS — I38 Endocarditis, valve unspecified: Secondary | ICD-10-CM | POA: Diagnosis not present

## 2017-05-16 DIAGNOSIS — E785 Hyperlipidemia, unspecified: Secondary | ICD-10-CM | POA: Diagnosis not present

## 2017-05-16 DIAGNOSIS — I10 Essential (primary) hypertension: Secondary | ICD-10-CM | POA: Diagnosis not present

## 2017-05-16 DIAGNOSIS — N183 Chronic kidney disease, stage 3 (moderate): Secondary | ICD-10-CM | POA: Diagnosis not present

## 2017-05-16 DIAGNOSIS — E291 Testicular hypofunction: Secondary | ICD-10-CM | POA: Diagnosis not present

## 2017-05-16 DIAGNOSIS — N401 Enlarged prostate with lower urinary tract symptoms: Secondary | ICD-10-CM | POA: Diagnosis not present

## 2017-05-16 DIAGNOSIS — I129 Hypertensive chronic kidney disease with stage 1 through stage 4 chronic kidney disease, or unspecified chronic kidney disease: Secondary | ICD-10-CM | POA: Diagnosis not present

## 2017-05-30 DIAGNOSIS — I129 Hypertensive chronic kidney disease with stage 1 through stage 4 chronic kidney disease, or unspecified chronic kidney disease: Secondary | ICD-10-CM | POA: Diagnosis not present

## 2017-05-30 DIAGNOSIS — J449 Chronic obstructive pulmonary disease, unspecified: Secondary | ICD-10-CM | POA: Diagnosis not present

## 2017-05-30 DIAGNOSIS — M5430 Sciatica, unspecified side: Secondary | ICD-10-CM | POA: Diagnosis not present

## 2017-05-30 DIAGNOSIS — N183 Chronic kidney disease, stage 3 (moderate): Secondary | ICD-10-CM | POA: Diagnosis not present

## 2017-06-02 ENCOUNTER — Other Ambulatory Visit (HOSPITAL_COMMUNITY): Payer: Self-pay | Admitting: Pulmonary Disease

## 2017-06-02 DIAGNOSIS — J439 Emphysema, unspecified: Secondary | ICD-10-CM

## 2017-06-06 DIAGNOSIS — L84 Corns and callosities: Secondary | ICD-10-CM | POA: Diagnosis not present

## 2017-06-06 DIAGNOSIS — D225 Melanocytic nevi of trunk: Secondary | ICD-10-CM | POA: Diagnosis not present

## 2017-06-06 DIAGNOSIS — Z1283 Encounter for screening for malignant neoplasm of skin: Secondary | ICD-10-CM | POA: Diagnosis not present

## 2017-06-13 ENCOUNTER — Other Ambulatory Visit: Payer: Self-pay | Admitting: Neurological Surgery

## 2017-06-13 DIAGNOSIS — Z6826 Body mass index (BMI) 26.0-26.9, adult: Secondary | ICD-10-CM | POA: Diagnosis not present

## 2017-06-13 DIAGNOSIS — M4317 Spondylolisthesis, lumbosacral region: Secondary | ICD-10-CM | POA: Diagnosis not present

## 2017-06-13 DIAGNOSIS — M7138 Other bursal cyst, other site: Secondary | ICD-10-CM | POA: Diagnosis not present

## 2017-06-15 ENCOUNTER — Ambulatory Visit (HOSPITAL_COMMUNITY)
Admission: RE | Admit: 2017-06-15 | Discharge: 2017-06-15 | Disposition: A | Discharge status: Home / Self Care | Payer: Medicare Other | Source: Ambulatory Visit | Attending: Pulmonary Disease | Admitting: Pulmonary Disease

## 2017-06-15 DIAGNOSIS — I2510 Atherosclerotic heart disease of native coronary artery without angina pectoris: Secondary | ICD-10-CM | POA: Diagnosis not present

## 2017-06-15 DIAGNOSIS — I700 Atherosclerosis of aorta: Secondary | ICD-10-CM | POA: Diagnosis not present

## 2017-06-15 DIAGNOSIS — R918 Other nonspecific abnormal finding of lung field: Secondary | ICD-10-CM | POA: Diagnosis not present

## 2017-06-15 DIAGNOSIS — J439 Emphysema, unspecified: Secondary | ICD-10-CM | POA: Diagnosis not present

## 2017-06-15 LAB — POCT I-STAT CREATININE: Creatinine, Ser: 1.4 mg/dL — ABNORMAL HIGH (ref 0.61–1.24)

## 2017-06-15 MED ORDER — IOPAMIDOL (ISOVUE-300) INJECTION 61%
75.0000 mL | Freq: Once | INTRAVENOUS | Status: AC | PRN
  Administered 2017-06-15: 75 mL via INTRAVENOUS

## 2017-06-20 DIAGNOSIS — R9389 Abnormal findings on diagnostic imaging of other specified body structures: Secondary | ICD-10-CM | POA: Diagnosis not present

## 2017-06-20 DIAGNOSIS — J449 Chronic obstructive pulmonary disease, unspecified: Secondary | ICD-10-CM | POA: Diagnosis not present

## 2017-07-21 ENCOUNTER — Encounter (HOSPITAL_COMMUNITY): Payer: Self-pay

## 2017-07-21 NOTE — Pre-Procedure Instructions (Signed)
[name] [name]  07/21/2017      [name] [phone] - [place], [place] - [address] [place] [address] Phone: [phone] Fax: [phone]    Your procedure is scheduled on May 15  Report to [name] at 630 A.M.  Call this number if you have problems the morning of surgery:  [phone]   Remember:  Do not eat food or drink liquids after midnight.  Take these medicines the morning of surgery with A SIP OF WATER   Amlodipine (Norvasc) Tamsulosin (Flomax)     Stop/ take aspirin as directed by your Dr.  Stop taking BC's, Goody's, Herbal medications, Fish Oil, Aleve, Ibuprofen, Advil, Motrin, Vitamins    Do not wear jewelry, make-up or nail polish.  Do not wear lotions, powders, or perfumes, or deodorant.  Do not shave 48 hours prior to surgery.  Men may shave face and neck.  Do not bring valuables to the hospital.  [name] is not responsible for any belongings or valuables.  Contacts, dentures or bridgework may not be worn into surgery.  Leave your suitcase in the car.  After surgery it may be brought to your room.  For patients admitted to the hospital, discharge time will be determined by your treatment team.  Patients discharged the day of surgery will not be allowed to drive home.    Special instructions:   [name]  Before surgery, you can play an important role. Because skin is not sterile, your skin needs to be as free of germs as possible. You can reduce the number of germs on your skin by washing with CHG (chlorahexidine gluconate) Soap before surgery.  CHG is an antiseptic cleaner which kills germs and bonds with the skin to continue killing germs even after washing.  Please do not use if you have an allergy to CHG or antibacterial soaps. If your skin becomes reddened/irritated stop using the CHG.  Do not shave (including legs and  underarms) for at least 48 hours prior to first CHG shower. It is OK to shave your face.  Please follow these instructions carefully.   1. Shower the NIGHT BEFORE SURGERY and the MORNING OF SURGERY with CHG.   2. If you chose to wash your hair, wash your hair first as usual with your normal shampoo.  3. After you shampoo, rinse your hair and body thoroughly to remove the shampoo.  4. Use CHG as you would any other liquid soap. You can apply CHG directly to the skin and wash gently with a scrungie or a clean washcloth.   5. Apply the CHG Soap to your body ONLY FROM THE NECK DOWN.  Do not use on open wounds or open sores. Avoid contact with your eyes, ears, mouth and genitals (private parts). Wash Face and genitals (private parts)  with your normal soap.  6. Wash thoroughly, paying special attention to the area where your surgery will be performed.  7. Thoroughly rinse your body with warm water from the neck down.  8. DO NOT shower/wash with your normal soap after using and rinsing off the CHG Soap.  9. Pat yourself dry with a CLEAN TOWEL.  10. Wear CLEAN PAJAMAS to bed the night before surgery, wear comfortable clothes the morning of surgery  11. Place CLEAN SHEETS on your bed the night of your first shower and DO NOT SLEEP WITH PETS.  Day of Surgery: Do not apply any deodorants/lotions. Please wear clean clothes to the hospital/surgery center.     Please read over the following fact sheets that you were given. Pain Booklet, Coughing and Deep Breathing, MRSA Information and Surgical Site Infection Prevention

## 2017-07-24 ENCOUNTER — Encounter (HOSPITAL_COMMUNITY)
Admission: RE | Admit: 2017-07-24 | Discharge: 2017-07-24 | Disposition: A | Discharge status: Home / Self Care | Payer: Medicare Other | Source: Ambulatory Visit | Attending: Neurological Surgery | Admitting: Neurological Surgery

## 2017-07-24 ENCOUNTER — Other Ambulatory Visit: Payer: Self-pay

## 2017-07-24 ENCOUNTER — Ambulatory Visit (HOSPITAL_COMMUNITY)
Admission: RE | Admit: 2017-07-24 | Discharge: 2017-07-24 | Disposition: A | Discharge status: Home / Self Care | Payer: Medicare Other | Source: Ambulatory Visit | Attending: Neurological Surgery | Admitting: Neurological Surgery

## 2017-07-24 ENCOUNTER — Encounter (HOSPITAL_COMMUNITY): Payer: Self-pay

## 2017-07-24 DIAGNOSIS — M4310 Spondylolisthesis, site unspecified: Secondary | ICD-10-CM

## 2017-07-24 DIAGNOSIS — M4316 Spondylolisthesis, lumbar region: Secondary | ICD-10-CM | POA: Insufficient documentation

## 2017-07-24 DIAGNOSIS — Z01810 Encounter for preprocedural cardiovascular examination: Secondary | ICD-10-CM | POA: Insufficient documentation

## 2017-07-24 DIAGNOSIS — J449 Chronic obstructive pulmonary disease, unspecified: Secondary | ICD-10-CM | POA: Diagnosis not present

## 2017-07-24 LAB — TYPE AND SCREEN
ABO/RH(D): A POS
ANTIBODY SCREEN: NEGATIVE

## 2017-07-24 LAB — BASIC METABOLIC PANEL
Anion gap: 8 (ref 5–15)
BUN: 19 mg/dL (ref 6–20)
CALCIUM: 9.2 mg/dL (ref 8.9–10.3)
CO2: 27 mmol/L (ref 22–32)
CREATININE: 1.29 mg/dL — AB (ref 0.61–1.24)
Chloride: 106 mmol/L (ref 101–111)
GFR calc Af Amer: >60 mL/min (ref >60)
GFR, EST NON AFRICAN AMERICAN: 54 mL/min — AB (ref >60)
Glucose, Bld: 101 mg/dL — ABNORMAL HIGH (ref 65–99)
Potassium: 4.4 mmol/L (ref 3.5–5.1)
Sodium: 141 mmol/L (ref 135–145)

## 2017-07-24 LAB — CBC WITH DIFFERENTIAL/PLATELET
BASOS ABS: 0 10*3/uL (ref 0.0–0.1)
Basophils Relative: 0 %
EOS ABS: 0.2 10*3/uL (ref 0.0–0.7)
Eosinophils Relative: 2 %
HCT: 44.1 % (ref 39.0–52.0)
Hemoglobin: 14.7 g/dL (ref 13.0–17.0)
Lymphocytes Relative: 23 %
Lymphs Abs: 2 10*3/uL (ref 0.7–4.0)
MCH: 31.2 pg (ref 26.0–34.0)
MCHC: 33.3 g/dL (ref 30.0–36.0)
MCV: 93.6 fL (ref 78.0–100.0)
Monocytes Absolute: 0.5 10*3/uL (ref 0.1–1.0)
Monocytes Relative: 6 %
Neutro Abs: 6.3 10*3/uL (ref 1.7–7.7)
Neutrophils Relative %: 69 %
PLATELETS: 321 10*3/uL (ref 150–400)
RBC: 4.71 MIL/uL (ref 4.22–5.81)
RDW: 13.3 % (ref 11.5–15.5)
WBC: 9 10*3/uL (ref 4.0–10.5)

## 2017-07-24 LAB — SURGICAL PCR SCREEN
MRSA, PCR: NEGATIVE
STAPHYLOCOCCUS AUREUS: NEGATIVE

## 2017-07-24 LAB — PROTIME-INR
INR: 0.99
PROTHROMBIN TIME: 13 s (ref 11.4–15.2)

## 2017-07-24 NOTE — Progress Notes (Addendum)
PCP: Sinda Du  Cardiologist: pt denies  EKG: pt denies past year, obtained today  Stress test: not in past 15-20 year  ECHO: 07/13/12 in [facility]  Cardiac Cath: pt denies ever  Chest x-ray: obtained today per order, denies past year  Pt last dose of aspirin 81 mg will be 07/27/17 per instructions by MD, per pt

## 2017-07-28 DIAGNOSIS — B351 Tinea unguium: Secondary | ICD-10-CM | POA: Diagnosis not present

## 2017-08-02 ENCOUNTER — Inpatient Hospital Stay (HOSPITAL_COMMUNITY): Payer: Medicare Other | Admitting: Certified Registered Nurse Anesthetist

## 2017-08-02 ENCOUNTER — Inpatient Hospital Stay (HOSPITAL_COMMUNITY): Payer: Medicare Other

## 2017-08-02 ENCOUNTER — Inpatient Hospital Stay (HOSPITAL_COMMUNITY)
Admission: RE | Disposition: A | Discharge status: Home / Self Care | Payer: Self-pay | Source: Ambulatory Visit | Attending: Neurological Surgery

## 2017-08-02 ENCOUNTER — Encounter (HOSPITAL_COMMUNITY): Payer: Self-pay

## 2017-08-02 ENCOUNTER — Inpatient Hospital Stay (HOSPITAL_COMMUNITY)
Admission: RE | Admit: 2017-08-02 | Discharge: 2017-08-03 | DRG: 460 | Disposition: A | Discharge status: Home / Self Care | Payer: Medicare Other | Source: Ambulatory Visit | Attending: Neurological Surgery | Admitting: Neurological Surgery

## 2017-08-02 DIAGNOSIS — Z7982 Long term (current) use of aspirin: Secondary | ICD-10-CM

## 2017-08-02 DIAGNOSIS — J449 Chronic obstructive pulmonary disease, unspecified: Secondary | ICD-10-CM | POA: Diagnosis present

## 2017-08-02 DIAGNOSIS — Z8249 Family history of ischemic heart disease and other diseases of the circulatory system: Secondary | ICD-10-CM

## 2017-08-02 DIAGNOSIS — Z79899 Other long term (current) drug therapy: Secondary | ICD-10-CM | POA: Diagnosis not present

## 2017-08-02 DIAGNOSIS — M4316 Spondylolisthesis, lumbar region: Secondary | ICD-10-CM | POA: Diagnosis present

## 2017-08-02 DIAGNOSIS — I10 Essential (primary) hypertension: Secondary | ICD-10-CM | POA: Diagnosis present

## 2017-08-02 DIAGNOSIS — Z419 Encounter for procedure for purposes other than remedying health state, unspecified: Secondary | ICD-10-CM

## 2017-08-02 DIAGNOSIS — M7138 Other bursal cyst, other site: Secondary | ICD-10-CM | POA: Diagnosis not present

## 2017-08-02 DIAGNOSIS — Z981 Arthrodesis status: Secondary | ICD-10-CM

## 2017-08-02 DIAGNOSIS — M4326 Fusion of spine, lumbar region: Secondary | ICD-10-CM | POA: Diagnosis not present

## 2017-08-02 DIAGNOSIS — Z87891 Personal history of nicotine dependence: Secondary | ICD-10-CM

## 2017-08-02 DIAGNOSIS — M48061 Spinal stenosis, lumbar region without neurogenic claudication: Secondary | ICD-10-CM | POA: Diagnosis not present

## 2017-08-02 SURGERY — POSTERIOR LUMBAR FUSION 1 LEVEL
Anesthesia: General | Site: Back

## 2017-08-02 MED ORDER — ACETAMINOPHEN 325 MG PO TABS
650.0000 mg | ORAL_TABLET | ORAL | Status: DC | PRN
Start: 2017-08-02 — End: 2017-08-03

## 2017-08-02 MED ORDER — AMLODIPINE BESYLATE 5 MG PO TABS
5.0000 mg | ORAL_TABLET | Freq: Every day | ORAL | Status: DC
  Administered 2017-08-03: 5 mg via ORAL
  Filled 2017-08-02: qty 1

## 2017-08-02 MED ORDER — THROMBIN (RECOMBINANT) 20000 UNITS EX SOLR
CUTANEOUS | Status: DC | PRN
  Administered 2017-08-02: 12:00:00 via TOPICAL

## 2017-08-02 MED ORDER — ONDANSETRON HCL 4 MG/2ML IJ SOLN
4.0000 mg | Freq: Four times a day (QID) | INTRAMUSCULAR | Status: DC | PRN
  Administered 2017-08-03: 4 mg via INTRAVENOUS
  Filled 2017-08-02: qty 2

## 2017-08-02 MED ORDER — HEPARIN SODIUM (PORCINE) 1000 UNIT/ML IJ SOLN
INTRAMUSCULAR | Status: AC
  Filled 2017-08-02: qty 1

## 2017-08-02 MED ORDER — HYDROMORPHONE HCL 1 MG/ML IJ SOLN: 0.5000 mg | INTRAMUSCULAR | Status: DC | PRN

## 2017-08-02 MED ORDER — HYDROCODONE-ACETAMINOPHEN 10-325 MG PO TABS
1.0000 | ORAL_TABLET | ORAL | Status: DC | PRN
  Administered 2017-08-02 – 2017-08-03 (×3): 1 via ORAL
  Filled 2017-08-02 (×3): qty 1

## 2017-08-02 MED ORDER — SUGAMMADEX SODIUM 200 MG/2ML IV SOLN
INTRAVENOUS | Status: DC | PRN
  Administered 2017-08-02: 200 mg via INTRAVENOUS

## 2017-08-02 MED ORDER — PHENOL 1.4 % MT LIQD: 1.0000 | OROMUCOSAL | Status: DC | PRN

## 2017-08-02 MED ORDER — MIDAZOLAM HCL 2 MG/2ML IJ SOLN: 0.5000 mg | Freq: Once | INTRAMUSCULAR | Status: DC | PRN

## 2017-08-02 MED ORDER — ACETAMINOPHEN 650 MG RE SUPP: 650.0000 mg | RECTAL | Status: DC | PRN

## 2017-08-02 MED ORDER — MIDAZOLAM HCL 2 MG/2ML IJ SOLN
INTRAMUSCULAR | Status: AC
  Filled 2017-08-02: qty 2

## 2017-08-02 MED ORDER — HEPARIN SODIUM (PORCINE) 1000 UNIT/ML IJ SOLN
INTRAMUSCULAR | Status: DC | PRN
  Administered 2017-08-02: 5000 [IU]

## 2017-08-02 MED ORDER — VANCOMYCIN HCL 1000 MG IV SOLR
INTRAVENOUS | Status: AC
  Filled 2017-08-02: qty 1000

## 2017-08-02 MED ORDER — METHOCARBAMOL 500 MG PO TABS: 500.0000 mg | ORAL_TABLET | Freq: Four times a day (QID) | ORAL | Status: DC | PRN

## 2017-08-02 MED ORDER — MENTHOL 3 MG MT LOZG: 1.0000 | LOZENGE | OROMUCOSAL | Status: DC | PRN

## 2017-08-02 MED ORDER — LIDOCAINE 2% (20 MG/ML) 5 ML SYRINGE
INTRAMUSCULAR | Status: AC
  Filled 2017-08-02: qty 5

## 2017-08-02 MED ORDER — MEPERIDINE HCL 50 MG/ML IJ SOLN: 6.2500 mg | INTRAMUSCULAR | Status: DC | PRN

## 2017-08-02 MED ORDER — ASPIRIN EC 81 MG PO TBEC
81.0000 mg | DELAYED_RELEASE_TABLET | Freq: Every day | ORAL | Status: DC
  Administered 2017-08-03: 81 mg via ORAL
  Filled 2017-08-02: qty 1

## 2017-08-02 MED ORDER — ROCURONIUM BROMIDE 10 MG/ML (PF) SYRINGE
PREFILLED_SYRINGE | INTRAVENOUS | Status: DC | PRN
  Administered 2017-08-02 (×2): 20 mg via INTRAVENOUS
  Administered 2017-08-02: 10 mg via INTRAVENOUS
  Administered 2017-08-02: 50 mg via INTRAVENOUS
  Administered 2017-08-02: 20 mg via INTRAVENOUS
  Administered 2017-08-02: 10 mg via INTRAVENOUS

## 2017-08-02 MED ORDER — HYDROMORPHONE HCL 2 MG/ML IJ SOLN
0.3000 mg | INTRAMUSCULAR | Status: DC | PRN
  Administered 2017-08-02 (×2): 0.5 mg via INTRAVENOUS

## 2017-08-02 MED ORDER — DEXAMETHASONE SODIUM PHOSPHATE 10 MG/ML IJ SOLN
10.0000 mg | INTRAMUSCULAR | Status: AC
  Administered 2017-08-02: 10 mg via INTRAVENOUS
  Filled 2017-08-02: qty 1

## 2017-08-02 MED ORDER — FENTANYL CITRATE (PF) 250 MCG/5ML IJ SOLN
INTRAMUSCULAR | Status: AC
  Filled 2017-08-02: qty 5

## 2017-08-02 MED ORDER — VANCOMYCIN HCL 1000 MG IV SOLR
INTRAVENOUS | Status: DC | PRN
  Administered 2017-08-02: 1000 mg via TOPICAL

## 2017-08-02 MED ORDER — PROMETHAZINE HCL 25 MG/ML IJ SOLN: 6.2500 mg | INTRAMUSCULAR | Status: DC | PRN

## 2017-08-02 MED ORDER — CELECOXIB 200 MG PO CAPS
200.0000 mg | ORAL_CAPSULE | Freq: Two times a day (BID) | ORAL | Status: DC
  Administered 2017-08-02 – 2017-08-03 (×2): 200 mg via ORAL
  Filled 2017-08-02 (×2): qty 1

## 2017-08-02 MED ORDER — 0.9 % SODIUM CHLORIDE (POUR BTL) OPTIME
TOPICAL | Status: DC | PRN
  Administered 2017-08-02: 1000 mL

## 2017-08-02 MED ORDER — ONDANSETRON HCL 4 MG/2ML IJ SOLN
INTRAMUSCULAR | Status: AC
  Filled 2017-08-02: qty 2

## 2017-08-02 MED ORDER — SODIUM CHLORIDE 0.9% FLUSH: 3.0000 mL | INTRAVENOUS | Status: DC | PRN

## 2017-08-02 MED ORDER — BUPIVACAINE HCL (PF) 0.25 % IJ SOLN
INTRAMUSCULAR | Status: DC | PRN
  Administered 2017-08-02: 10 mL
  Administered 2017-08-02: 3 mL

## 2017-08-02 MED ORDER — SODIUM CHLORIDE 0.9 % IJ SOLN
INTRAMUSCULAR | Status: DC | PRN
  Administered 2017-08-02: 5 mL

## 2017-08-02 MED ORDER — SODIUM CHLORIDE 0.9% FLUSH
3.0000 mL | Freq: Two times a day (BID) | INTRAVENOUS | Status: DC
  Administered 2017-08-02: 3 mL via INTRAVENOUS

## 2017-08-02 MED ORDER — ONDANSETRON HCL 4 MG/2ML IJ SOLN
INTRAMUSCULAR | Status: DC | PRN
  Administered 2017-08-02: 4 mg via INTRAVENOUS

## 2017-08-02 MED ORDER — CEFAZOLIN SODIUM-DEXTROSE 2-4 GM/100ML-% IV SOLN
2.0000 g | INTRAVENOUS | Status: AC
  Administered 2017-08-02: 2 g via INTRAVENOUS

## 2017-08-02 MED ORDER — POTASSIUM CHLORIDE IN NACL 20-0.9 MEQ/L-% IV SOLN: INTRAVENOUS | Status: DC

## 2017-08-02 MED ORDER — ROCURONIUM BROMIDE 50 MG/5ML IV SOLN
INTRAVENOUS | Status: AC
  Filled 2017-08-02: qty 1

## 2017-08-02 MED ORDER — TAMSULOSIN HCL 0.4 MG PO CAPS
0.4000 mg | ORAL_CAPSULE | Freq: Once | ORAL | Status: AC
  Administered 2017-08-02: 0.4 mg via ORAL
  Filled 2017-08-02: qty 1

## 2017-08-02 MED ORDER — BUPIVACAINE HCL (PF) 0.25 % IJ SOLN
INTRAMUSCULAR | Status: AC
  Filled 2017-08-02: qty 30

## 2017-08-02 MED ORDER — CEFAZOLIN SODIUM-DEXTROSE 2-4 GM/100ML-% IV SOLN
2.0000 g | Freq: Three times a day (TID) | INTRAVENOUS | Status: AC
  Administered 2017-08-02 – 2017-08-03 (×2): 2 g via INTRAVENOUS
  Filled 2017-08-02 (×2): qty 100

## 2017-08-02 MED ORDER — LIDOCAINE 2% (20 MG/ML) 5 ML SYRINGE
INTRAMUSCULAR | Status: DC | PRN
  Administered 2017-08-02: 40 mg via INTRAVENOUS

## 2017-08-02 MED ORDER — FENTANYL CITRATE (PF) 100 MCG/2ML IJ SOLN
INTRAMUSCULAR | Status: DC | PRN
  Administered 2017-08-02: 50 ug via INTRAVENOUS
  Administered 2017-08-02 (×2): 100 ug via INTRAVENOUS

## 2017-08-02 MED ORDER — CHLORHEXIDINE GLUCONATE CLOTH 2 % EX PADS: 6.0000 | MEDICATED_PAD | Freq: Once | CUTANEOUS | Status: DC

## 2017-08-02 MED ORDER — PHENYLEPHRINE HCL 10 MG/ML IJ SOLN
INTRAVENOUS | Status: DC | PRN
  Administered 2017-08-02: 25 ug/min via INTRAVENOUS

## 2017-08-02 MED ORDER — METHOCARBAMOL 1000 MG/10ML IJ SOLN: 500.0000 mg | Freq: Four times a day (QID) | INTRAVENOUS | Status: DC | PRN

## 2017-08-02 MED ORDER — GLYCOPYRROLATE 0.2 MG/ML IV SOSY
PREFILLED_SYRINGE | INTRAVENOUS | Status: DC | PRN
  Administered 2017-08-02: .2 mg via INTRAVENOUS

## 2017-08-02 MED ORDER — THROMBIN (RECOMBINANT) 5000 UNITS EX SOLR
OROMUCOSAL | Status: DC | PRN
  Administered 2017-08-02: 12:00:00 via TOPICAL

## 2017-08-02 MED ORDER — PHENYLEPHRINE HCL 10 MG/ML IJ SOLN
INTRAMUSCULAR | Status: DC | PRN
  Administered 2017-08-02: 80 ug via INTRAVENOUS

## 2017-08-02 MED ORDER — LACTATED RINGERS IV SOLN
INTRAVENOUS | Status: DC
  Administered 2017-08-02 (×2): via INTRAVENOUS

## 2017-08-02 MED ORDER — DEXAMETHASONE SODIUM PHOSPHATE 10 MG/ML IJ SOLN
INTRAMUSCULAR | Status: AC
  Filled 2017-08-02: qty 1

## 2017-08-02 MED ORDER — HYDROCODONE-ACETAMINOPHEN 10-325 MG PO TABS
2.0000 | ORAL_TABLET | ORAL | Status: DC | PRN
  Administered 2017-08-02: 2 via ORAL
  Filled 2017-08-02 (×2): qty 2

## 2017-08-02 MED ORDER — HYDROMORPHONE HCL 2 MG/ML IJ SOLN
INTRAMUSCULAR | Status: AC
  Administered 2017-08-02: 0.5 mg via INTRAVENOUS
  Filled 2017-08-02: qty 1

## 2017-08-02 MED ORDER — THROMBIN 20000 UNITS EX SOLR
CUTANEOUS | Status: AC
  Filled 2017-08-02: qty 20000

## 2017-08-02 MED ORDER — BACITRACIN 50000 UNITS IM SOLR
INTRAMUSCULAR | Status: DC | PRN
  Administered 2017-08-02: 12:00:00

## 2017-08-02 MED ORDER — SENNA 8.6 MG PO TABS
1.0000 | ORAL_TABLET | Freq: Two times a day (BID) | ORAL | Status: DC
  Administered 2017-08-02 – 2017-08-03 (×2): 8.6 mg via ORAL
  Filled 2017-08-02 (×2): qty 1

## 2017-08-02 MED ORDER — ONDANSETRON HCL 4 MG PO TABS: 4.0000 mg | ORAL_TABLET | Freq: Four times a day (QID) | ORAL | Status: DC | PRN

## 2017-08-02 MED ORDER — PROPOFOL 10 MG/ML IV BOLUS
INTRAVENOUS | Status: DC | PRN
  Administered 2017-08-02: 100 mg via INTRAVENOUS
  Administered 2017-08-02: 50 mg via INTRAVENOUS

## 2017-08-02 MED ORDER — TESTOSTERONE 50 MG/5GM (1%) TD GEL: 5.0000 g | Freq: Every day | TRANSDERMAL | Status: DC

## 2017-08-02 MED ORDER — TAMSULOSIN HCL 0.4 MG PO CAPS
0.4000 mg | ORAL_CAPSULE | Freq: Every day | ORAL | Status: DC
  Administered 2017-08-03: 0.4 mg via ORAL
  Filled 2017-08-02: qty 1

## 2017-08-02 MED ORDER — SODIUM CHLORIDE 0.9 % IV SOLN: 250.0000 mL | INTRAVENOUS | Status: DC

## 2017-08-02 MED ORDER — PROPOFOL 10 MG/ML IV BOLUS
INTRAVENOUS | Status: AC
  Filled 2017-08-02: qty 20

## 2017-08-02 MED ORDER — SUGAMMADEX SODIUM 200 MG/2ML IV SOLN
INTRAVENOUS | Status: AC
  Filled 2017-08-02: qty 2

## 2017-08-02 MED ORDER — THROMBIN 5000 UNITS EX SOLR
CUTANEOUS | Status: AC
  Filled 2017-08-02: qty 5000

## 2017-08-02 SURGICAL SUPPLY — 68 items
BAG DECANTER FOR FLEXI CONT (MISCELLANEOUS) ×2 IMPLANT
BASKET BONE COLLECTION (BASKET) ×2 IMPLANT
BENZOIN TINCTURE PRP APPL 2/3 (GAUZE/BANDAGES/DRESSINGS) ×2 IMPLANT
BLADE CLIPPER SURG (BLADE) ×2 IMPLANT
BUR MATCHSTICK NEURO 3.0 LAGG (BURR) ×2 IMPLANT
CAGE POROUS TI 12X9X25 10D (Cage) ×4 IMPLANT
CANISTER SUCT 3000ML PPV (MISCELLANEOUS) ×2 IMPLANT
CARTRIDGE OIL MAESTRO DRILL (MISCELLANEOUS) ×1 IMPLANT
CONT SPEC 4OZ CLIKSEAL STRL BL (MISCELLANEOUS) ×4 IMPLANT
COVER BACK TABLE 60X90IN (DRAPES) ×2 IMPLANT
DERMABOND ADVANCED (GAUZE/BANDAGES/DRESSINGS) ×1
DERMABOND ADVANCED .7 DNX12 (GAUZE/BANDAGES/DRESSINGS) ×1 IMPLANT
DIFFUSER DRILL AIR PNEUMATIC (MISCELLANEOUS) ×2 IMPLANT
DRAPE C-ARM 42X72 X-RAY (DRAPES) ×2 IMPLANT
DRAPE C-ARMOR (DRAPES) ×2 IMPLANT
DRAPE LAPAROTOMY 100X72X124 (DRAPES) ×2 IMPLANT
DRAPE POUCH INSTRU U-SHP 10X18 (DRAPES) IMPLANT
DRAPE SURG 17X23 STRL (DRAPES) ×2 IMPLANT
DRSG OPSITE POSTOP 4X6 (GAUZE/BANDAGES/DRESSINGS) ×2 IMPLANT
DURAPREP 26ML APPLICATOR (WOUND CARE) ×2 IMPLANT
ELECT REM PT RETURN 9FT ADLT (ELECTROSURGICAL) ×2
ELECTRODE REM PT RTRN 9FT ADLT (ELECTROSURGICAL) ×1 IMPLANT
EVACUATOR 1/8 PVC DRAIN (DRAIN) IMPLANT
GAUZE SPONGE 4X4 16PLY XRAY LF (GAUZE/BANDAGES/DRESSINGS) IMPLANT
GLOVE BIO SURGEON STRL SZ7 (GLOVE) ×4 IMPLANT
GLOVE BIO SURGEON STRL SZ8 (GLOVE) ×4 IMPLANT
GLOVE BIOGEL PI IND STRL 6.5 (GLOVE) ×3 IMPLANT
GLOVE BIOGEL PI IND STRL 7.0 (GLOVE) ×2 IMPLANT
GLOVE BIOGEL PI IND STRL 7.5 (GLOVE) ×2 IMPLANT
GLOVE BIOGEL PI INDICATOR 6.5 (GLOVE) ×3
GLOVE BIOGEL PI INDICATOR 7.0 (GLOVE) ×2
GLOVE BIOGEL PI INDICATOR 7.5 (GLOVE) ×2
GLOVE ECLIPSE 6.5 STRL STRAW (GLOVE) ×2 IMPLANT
GLOVE ECLIPSE 7.5 STRL STRAW (GLOVE) ×4 IMPLANT
GLOVE SURG SS PI 6.5 STRL IVOR (GLOVE) ×10 IMPLANT
GOWN STRL REUS W/ TWL LRG LVL3 (GOWN DISPOSABLE) ×6 IMPLANT
GOWN STRL REUS W/ TWL XL LVL3 (GOWN DISPOSABLE) ×3 IMPLANT
GOWN STRL REUS W/TWL 2XL LVL3 (GOWN DISPOSABLE) IMPLANT
GOWN STRL REUS W/TWL LRG LVL3 (GOWN DISPOSABLE) ×6
GOWN STRL REUS W/TWL XL LVL3 (GOWN DISPOSABLE) ×3
HEMOSTAT POWDER KIT SURGIFOAM (HEMOSTASIS) IMPLANT
HEMOSTAT POWDER SURGIFOAM 1G (HEMOSTASIS) ×2 IMPLANT
IV CATH AUTO 14GX1.75 SAFE ORG (IV SOLUTION) ×2 IMPLANT
KIT BASIN OR (CUSTOM PROCEDURE TRAY) ×2 IMPLANT
KIT BONE MRW ASP ANGEL CPRP (KITS) ×2 IMPLANT
KIT TURNOVER KIT B (KITS) ×2 IMPLANT
MILL MEDIUM DISP (BLADE) ×2 IMPLANT
NEEDLE HYPO 25X1 1.5 SAFETY (NEEDLE) ×2 IMPLANT
NS IRRIG 1000ML POUR BTL (IV SOLUTION) ×2 IMPLANT
OIL CARTRIDGE MAESTRO DRILL (MISCELLANEOUS) ×2
PACK LAMINECTOMY NEURO (CUSTOM PROCEDURE TRAY) ×2 IMPLANT
PAD ARMBOARD 7.5X6 YLW CONV (MISCELLANEOUS) ×6 IMPLANT
PUTTY DBM ALLOSYNC PURE 10CC (Putty) ×2 IMPLANT
ROD PC 5.5X35 TI ARSENAL (Rod) ×4 IMPLANT
SCREW CBX 5.5X45 ARSENAL (Screw) ×8 IMPLANT
SCREW SET SPINAL ARSENAL 47127 (Screw) ×8 IMPLANT
SPONGE LAP 4X18 X RAY DECT (DISPOSABLE) IMPLANT
SPONGE SURGIFOAM ABS GEL 100 (HEMOSTASIS) ×2 IMPLANT
STRIP CLOSURE SKIN 1/2X4 (GAUZE/BANDAGES/DRESSINGS) ×2 IMPLANT
SUT VIC AB 0 CT1 18XCR BRD8 (SUTURE) ×1 IMPLANT
SUT VIC AB 0 CT1 8-18 (SUTURE) ×1
SUT VIC AB 2-0 CP2 18 (SUTURE) ×2 IMPLANT
SUT VIC AB 3-0 SH 8-18 (SUTURE) ×4 IMPLANT
SYR 20CC LL (SYRINGE) ×2 IMPLANT
TOWEL GREEN STERILE (TOWEL DISPOSABLE) ×2 IMPLANT
TOWEL GREEN STERILE FF (TOWEL DISPOSABLE) ×2 IMPLANT
TRAY FOLEY MTR SLVR 16FR STAT (SET/KITS/TRAYS/PACK) ×2 IMPLANT
WATER STERILE IRR 1000ML POUR (IV SOLUTION) ×2 IMPLANT

## 2017-08-02 NOTE — Anesthesia Preprocedure Evaluation (Addendum)
Anesthesia Evaluation  Patient identified by MRN, date of birth, ID band Patient awake    Reviewed: Allergy & Precautions, NPO status , Patient's Chart, lab work & pertinent test results  History of Anesthesia Complications Negative for: history of anesthetic complications  Airway Mallampati: II  TM Distance: >3 FB Neck ROM: Full    Dental  (+) Poor Dentition, Dental Advisory Given   Pulmonary COPD, former smoker (quit 2016),    breath sounds clear to auscultation       Cardiovascular hypertension, Pt. on medications (-) angina Rhythm:Regular Rate:Normal  '16 ECHO: EF 55-60%, valves OK   Neuro/Psych Chronic back pain    GI/Hepatic negative GI ROS, Neg liver ROS,   Endo/Other  negative endocrine ROS  Renal/GU Renal InsufficiencyRenal disease (creat 1.29)     Musculoskeletal   Abdominal   Peds  Hematology negative hematology ROS (+)   Anesthesia Other Findings   Reproductive/Obstetrics                            Anesthesia Physical Anesthesia Plan  ASA: III  Anesthesia Plan: General   Post-op Pain Management:    Induction: Intravenous  PONV Risk Score and Plan: 3 and Ondansetron, Dexamethasone and Treatment may vary due to age or medical condition  Airway Management Planned: Oral ETT  Additional Equipment:   Intra-op Plan:   Post-operative Plan: Extubation in OR  Informed Consent: I have reviewed the patients History and Physical, chart, labs and discussed the procedure including the risks, benefits and alternatives for the proposed anesthesia with the patient or authorized representative who has indicated his/her understanding and acceptance.   Dental advisory given  Plan Discussed with: CRNA and Surgeon  Anesthesia Plan Comments: (Plan routine monitors, GETA)        Anesthesia Quick Evaluation

## 2017-08-02 NOTE — H&P (Signed)
Subjective: Patient is a 72 y.o. male admitted for PLIF. Onset of symptoms was several months ago, gradually worsening since that time.  The pain is rated severe, and is located at the across the lower back and radiates to leg. The pain is described as aching and occurs all day. The symptoms have been progressive. Symptoms are exacerbated by exercise. MRI or CT showed spondylolisthesis L4-5   Past Medical History:  Diagnosis Date  . COPD (chronic obstructive pulmonary disease) ([place])   . Hypertension   . Low serum testosterone level   . Pneumonia 05/2014  . Shortness of breath dyspnea   . Urinary frequency     Past Surgical History:  Procedure Laterality Date  . APPENDECTOMY    . COLONOSCOPY  2011   Tubular adenoma per patient  . COLONOSCOPY N/A 03/03/2015   Procedure: COLONOSCOPY;  Surgeon: Danie Binder, MD;  [facility]: [facility];  Service: Endoscopy;  Laterality: N/A;  1245 - moved to 1:00 - office to notify pt   . EMPYEMA DRAINAGE Right 06/27/2014   Procedure: EMPYEMA DRAINAGE;  Surgeon: Grace Isaac, MD;  Location: [place];  Service: Thoracic;  Laterality: Right;  . HEMORRHOID BANDING N/A 03/03/2015   Procedure: HEMORRHOID BANDING;  Surgeon: Danie Binder, MD;  Location: [facility];  Service: Endoscopy;  Laterality: N/A;  Recomended to see a Psychologist, sport and exercise.  [name]    . TEE WITHOUT CARDIOVERSION N/A 07/14/2014   Procedure: TRANSESOPHAGEAL ECHOCARDIOGRAM (TEE);  Surgeon: Sueanne Margarita, MD;  Location[facility];  Service: Cardiovascular;  Laterality: N/A;  . VARICOSE VEIN SURGERY    . VIDEO ASSISTED THORACOSCOPY Right 06/27/2014   Procedure: VIDEO ASSISTED THORACOSCOPY;  Surgeon: Grace Isaac, MD;  Location: [place];  Service: Thoracic;  Laterality: Right;  Marland Kitchen VIDEO BRONCHOSCOPY N/A 06/27/2014   Procedure: VIDEO BRONCHOSCOPY;  Surgeon: Grace Isaac, MD;  Location: [facility] OR;  Service: Thoracic;  Laterality: N/A;    Prior to Admission medications   Medication Sig Start  Date End Date Taking? Authorizing Provider  ALPRAZolam Duanne Moron) 0.25 MG tablet Take 0.25 mg by mouth at bedtime as needed for sleep. 07/11/17  Yes [provider]  amLODipine (NORVASC) 5 MG tablet Take 5 mg by mouth daily.  05/20/14  Yes [provider]  aspirin EC 81 MG tablet Take 81 mg by mouth daily.   Yes [provider]  Calcium Citrate-Vitamin D (CALCIUM CITRATE + D3 PO) Take 1 tablet by mouth daily.   Yes [provider]  [name] Oil 350 MG CAPS Take 350 mg by mouth daily.   Yes [provider]  Lactobacillus (ACIDOPHILUS PROBIOTIC) 10 MG TABS Take 10 mg by mouth daily.    Yes [provider]  Multiple Vitamins-Minerals (MULTIVITAMINS THER. W/MINERALS) TABS tablet Take 1 tablet by mouth daily.   Yes [provider]  NON FORMULARY Apply 1 application topically daily. Fluc2% Toe Nail Drops for Fungus   Yes [provider]  PHYTOSTEROLS PO Take 1 tablet by mouth 2 (two) times daily.    Yes [provider]  rosuvastatin (CRESTOR) 5 MG tablet Take 5 mg by mouth daily.   Yes [provider]  tamsulosin (FLOMAX) 0.4 MG CAPS capsule Take 0.4 mg by mouth daily.  05/30/14  Yes [provider]  testosterone (ANDROGEL) 50 MG/5GM (1%) GEL Place 5 g onto the skin daily. 05/21/14  Yes [provider]  polyethylene glycol-electrolytes (NULYTELY/GOLYTELY) 420 G solution Take 4,000 mLs by  mouth once. Patient not taking: Reported on 07/18/2017 02/05/15   Carlis Stable, NP   No Known Allergies  Social History   Tobacco Use  . Smoking status: Former Smoker    Types: Pipe    Last attempt to quit: 06/07/2014    Years since quitting: 3.1  . Smokeless tobacco: Never Used  Substance Use Topics  . Alcohol use: Yes    Alcohol/week: 0.0 oz    Comment: Average 1-2 drinks of beer in the evening    Family History  Problem Relation Age of Onset  . Tuberculosis Father   . Hypertension Father   . COPD Mother   .  Hypertension Mother   . Colon cancer Neg Hx      Review of Systems  Positive ROS: neg  All other systems have been reviewed and were otherwise negative with the exception of those mentioned in the HPI and as above.  Objective: Vital signs in last 24 hours: Temp:  [97.4 F (36.3 C)] 97.4 F (36.3 C) (05/15 0710) Pulse Rate:  [64] 64 (05/15 0710) Resp:  [20] 20 (05/15 0710) BP: (136)/(75) 136/75 (05/15 0710) SpO2:  [100 %] 100 % (05/15 0710) Weight:  [87.5 kg (193 lb)] 87.5 kg (193 lb) (05/15 0710)  General Appearance: Alert, cooperative, no distress, appears stated age Head: Normocephalic, without obvious abnormality, atraumatic Eyes: PERRL, conjunctiva/corneas clear, EOM's intact    Neck: Supple, symmetrical, trachea midline Back: Symmetric, no curvature, ROM normal, no CVA tenderness Lungs:  respirations unlabored Heart: Regular rate and rhythm Abdomen: Soft, non-tender Extremities: Extremities normal, atraumatic, no cyanosis or edema Pulses: 2+ and symmetric all extremities Skin: Skin color, texture, turgor normal, no rashes or lesions  NEUROLOGIC:   Mental status: Alert and oriented x4,  no aphasia, good attention span, fund of knowledge, and memory Motor Exam - grossly normal Sensory Exam - grossly normal Reflexes: 1+ Coordination - grossly normal Gait - grossly normal Balance - grossly normal Cranial Nerves: I: smell Not tested  II: visual acuity  OS: nl    OD: nl  II: visual fields Full to confrontation  II: pupils Equal, round, reactive to light  III,VII: ptosis None  III,IV,VI: extraocular muscles  Full ROM  V: mastication Normal  V: facial light touch sensation  Normal  V,VII: corneal reflex  Present  VII: facial muscle function - upper  Normal  VII: facial muscle function - lower Normal  VIII: hearing Not tested  IX: soft palate elevation  Normal  IX,X: gag reflex Present  XI: trapezius strength  5/5  XI: sternocleidomastoid strength 5/5  XI: neck  flexion strength  5/5  XII: tongue strength  Normal    Data Review Lab Results  Component Value Date   WBC 9.0 07/24/2017   HGB 14.7 07/24/2017   HCT 44.1 07/24/2017   MCV 93.6 07/24/2017   PLT 321 07/24/2017   Lab Results  Component Value Date   NA 141 07/24/2017   K 4.4 07/24/2017   CL 106 07/24/2017   CO2 27 07/24/2017   BUN 19 07/24/2017   CREATININE 1.29 (H) 07/24/2017   GLUCOSE 101 (H) 07/24/2017   Lab Results  Component Value Date   INR 0.99 07/24/2017    Assessment/Plan:  Estimated body mass index is 26.92 kg/m as calculated from the following:   Height as of 07/24/17: 5\' 11"  (1.803 m).   Weight as of this encounter: 87.5 kg (193 lb). Patient admitted for PLIF L4-5. Patient has failed a reasonable  attempt at conservative therapy.  I explained the condition and procedure to the patient and answered any questions.  Patient wishes to proceed with procedure as planned. Understands risks/ benefits and typical outcomes of procedure.   Ross Bender S 08/02/2017 7:27 AM

## 2017-08-02 NOTE — Anesthesia Postprocedure Evaluation (Signed)
Anesthesia Post Note  Patient: [name] [name]  Procedure(s) Performed: Posterior Lumbar Interbody Fusion - Lumbar four-five (N/A Back)     Patient location during evaluation: PACU Anesthesia Type: General Level of consciousness: awake and alert, oriented and patient cooperative Pain management: pain level controlled (pain improving) Vital Signs Assessment: post-procedure vital signs reviewed and stable Respiratory status: spontaneous breathing, nonlabored ventilation and respiratory function stable Cardiovascular status: blood pressure returned to baseline and stable Postop Assessment: no apparent nausea or vomiting Anesthetic complications: no    Last Vitals:  Vitals:   08/02/17 1445 08/02/17 1450  BP:  130/80  Pulse: 70 69  Resp: 12 12  Temp:    SpO2: 100% 99%    Last Pain:  Vitals:   08/02/17 1435  TempSrc:   PainSc: 7                  Brandell Maready,E. Espn Zeman

## 2017-08-02 NOTE — Evaluation (Addendum)
Physical Therapy Evaluation Patient Details Name: [name] [name] MRN: [id number] DOB: 10-02-1945 Today's Date: 08/02/2017   History of Present Illness  72 y.o. male s/p L4-L5 PLIF 08/02/17. PMH includes: COPD, HTN.  Clinical Impression  Patient is s/p above surgery resulting in functional limitations due to the deficits listed below (see PT Problem List). PTA, pt living with wife in 1 story home with stairs to enter, independent with all mobility. Upon eval, pt presents with mild imbalance,  Nausea, and post op pain that limit his mobility. Ambulating the unit without assistive device with close supervision, as well as stairs with min guard due to unsteadiness on feet this visit. Anticipate patient will do well next PT visit once nausea has subsided, reviewed log roll technique, spinal precautions and answered patients questions.  Patient will benefit from skilled PT to increase their independence and safety with mobility to allow discharge to the venue listed below.       Follow Up Recommendations No PT follow up    Equipment Recommendations  None recommended by PT    Recommendations for Other Services       Precautions / Restrictions Precautions Precautions: Back Precaution Booklet Issued: Yes (comment) Precaution Comments: reviewed verbally and with handout precautions  Required Braces or Orthoses: Spinal Brace Spinal Brace: Lumbar corset;Applied in sitting position Restrictions Weight Bearing Restrictions: No      Mobility  Bed Mobility Overal bed mobility: Needs Assistance Bed Mobility: Rolling;Sidelying to Sit Rolling: Supervision Sidelying to sit: Supervision       General bed mobility comments: cues for log roll technique, pt able to perform without physical assitance.   Transfers Overall transfer level: Needs assistance Equipment used: None Transfers: Sit to/from Stand Sit to Stand: Min guard         General transfer comment: min guard for safety, pt with  nausea this visit.   Ambulation/Gait Ambulation/Gait assistance: Min guard;Supervision Ambulation Distance (Feet): 120 Feet Assistive device: None Gait Pattern/deviations: Step-through pattern;Decreased stride length Gait velocity: decreased    General Gait Details: patient ambulating the unit without assistive device, mild unsteadiness due to dizziness and nausea this visit.   Stairs Stairs: Yes Stairs assistance: Min guard;Supervision Stair Management: One rail Right;Forwards;Step to pattern Number of Stairs: 12 General stair comments: patient with step to pattern, min guard progressing to close supervision. Unsteady and feeling nauses this visit, will benefit from another session for more practice.   Wheelchair Mobility    Modified Rankin (Stroke Patients Only)       Balance Overall balance assessment: Mild deficits observed, not formally tested                                           Pertinent Vitals/Pain Pain Assessment: Faces Faces Pain Scale: Hurts even more Pain Location: lumbar Pain Descriptors / Indicators: Discomfort;Grimacing Pain Intervention(s): Limited activity within patient's tolerance;Monitored during session;Premedicated before session;Repositioned    Home Living Family/patient expects to be discharged to:: Private residence Living Arrangements: Spouse/significant other Available Help at Discharge: Family;Available 24 hours/day Type of Home: House Home Access: Stairs to enter Entrance Stairs-Rails: Chemical engineer of Steps: 3 Home Layout: One level Home Equipment: Walker - 2 wheels;Cane - single point      Prior Function Level of Independence: Independent         Comments: active, works out in [employer]  Extremity/Trunk Assessment   Upper Extremity Assessment Upper Extremity Assessment: Overall WFL for tasks assessed    Lower Extremity Assessment Lower Extremity Assessment:  (RLE 4/5, LLE 4-/5 gross )       Communication   Communication: No difficulties  Cognition Arousal/Alertness: Awake/alert Behavior During Therapy: WFL for tasks assessed/performed Overall Cognitive Status: Within Functional Limits for tasks assessed                                        General Comments      Exercises     Assessment/Plan    PT Assessment Patient needs continued PT services  PT Problem List Decreased strength;Pain;Decreased activity tolerance;Decreased balance       PT Treatment Interventions Gait training;DME instruction;Stair training;Functional mobility training;Therapeutic activities;Therapeutic exercise;Balance training    PT Goals (Current goals can be found in the Care Plan section)  Acute Rehab PT Goals Patient Stated Goal: return home tomorrow  PT Goal Formulation: With patient Time For Goal Achievement: 08/09/17 Potential to Achieve Goals: Good    Frequency Min 3X/week   Barriers to discharge        Co-evaluation               AM-PAC PT "6 Clicks" Daily Activity  Outcome Measure Difficulty turning over in bed (including adjusting bedclothes, sheets and blankets)?: A Little Difficulty moving from lying on back to sitting on the side of the bed? : A Little Difficulty sitting down on and standing up from a chair with arms (e.g., wheelchair, bedside commode, etc,.)?: A Little Help needed moving to and from a bed to chair (including a wheelchair)?: A Little Help needed walking in hospital room?: A Little Help needed climbing 3-5 steps with a railing? : A Little 6 Click Score: 18    End of Session Equipment Utilized During Treatment: Gait belt Activity Tolerance: Patient tolerated treatment well Patient left: in bed;with call bell/phone within reach Nurse Communication: Mobility status PT Visit Diagnosis: Unsteadiness on feet (R26.81);Pain Pain - part of body: (lumbar)    Time: 1620-1650 PT Time Calculation (min)  (ACUTE ONLY): 30 min   Charges:   PT Evaluation $PT Eval Low Complexity: 1 Low PT Treatments $Gait Training: 8-22 mins   PT G Codes:      Reinaldo Berber, PT, DPT Acute Rehab Services Pager: [phone]    Reinaldo Berber 08/02/2017, 9:28 PM

## 2017-08-02 NOTE — Op Note (Signed)
08/02/2017  2:22 PM  PATIENT:  [name] [name]  72 y.o. male  PRE-OPERATIVE DIAGNOSIS:  Degenerative spondylolisthesis L4-5 with spinal stenosis, left-sided synovial cyst, back and left leg pain  POST-OPERATIVE DIAGNOSIS:  same  PROCEDURE:   1. Decompressive lumbar laminectomy L4-5 requiring more work than would be required for a simple exposure of the disk for PLIF in order to adequately decompress the neural elements and address the spinal stenosis, with resection of left-sided synovial cyst 2. Posterior lumbar interbody fusion L4-5 using porous titanium interbody cages packed with morcellized allograft and autograft 3. Posterior fixation L4-5 using Alphatec cortical pedicle screws.  4. Intertransverse arthrodesis L4-5 using morcellized autograft and allograft.  SURGEON:  Sherley Bounds, MD  ASSISTANTS: Dr. Cyndy Freeze  ANESTHESIA:  General  EBL: 75 ml  Total I/O In: 1500 [I.V.:1500] Out: 425 [Urine:350; Blood:75]  BLOOD ADMINISTERED:none  DRAINS: none   INDICATION FOR PROCEDURE: This patient presented with back and left leg pain. Imaging revealed spondylolisthesis L4-5 with a large left synovial cyst. The patient tried a reasonable attempt at conservative medical measures without relief. I recommended decompression and instrumented fusion to address the stenosis as well as the segmental  instability.  Patient understood the risks, benefits, and alternatives and potential outcomes and wished to proceed.  PROCEDURE DETAILS:  The patient was brought to the operating room. After induction of generalized endotracheal anesthesia the patient was rolled into the prone position on chest rolls and all pressure points were padded. The patient's lumbar region was cleaned and then prepped with DuraPrep and draped in the usual sterile fashion. Anesthesia was injected and then a dorsal midline incision was made and carried down to the lumbosacral fascia. The fascia was opened and the paraspinous  musculature was taken down in a subperiosteal fashion to expose L4-5. A self-retaining retractor was placed. Intraoperative fluoroscopy confirmed my level, and I started with placement of the L4 cortical pedicle screws. The pedicle screw entry zones were identified utilizing surface landmarks and  AP and lateral fluoroscopy. I scored the cortex with the high-speed drill and then used the hand drill to drill an upward and outward direction into the pedicle. I then tapped line to line. I then placed a 5.5 x 45 mm cortical pedicle screw into the pedicles of L4 bilaterally. I then turned my attention to the decompression and complete lumbar laminectomies, hemi- facetectomies, and foraminotomies were performed at L4-5. The patient had significant spinal stenosis and this required more work than would be required for a simple exposure of the disc for posterior lumbar interbody fusion which would only require a limited laminotomy. Much more generous decompression and generous foraminotomy was undertaken in order to adequately decompress the neural elements and address the patient's leg pain. The yellow ligament was removed to expose the underlying dura and nerve roots, and generous foraminotomies were performed to adequately decompress the neural elements. A large left-sided synovial cyst was encountered and removed. I carefully dissected away from the dura and removed it. The L5 nerve root appeared to be well decompressed. Both the exiting and traversing nerve roots were decompressed on both sides until a coronary dilator passed easily along the nerve roots. Once the decompression was complete, I turned my attention to the posterior lower lumbar interbody fusion. The epidural venous vasculature was coagulated and cut sharply. Disc space was incised and the initial discectomy was performed with pituitary rongeurs. The disc space was distracted with sequential distractors to a height of 12 mm. We then used a series  of  scrapers and shavers to prepare the endplates for fusion. The midline was prepared with Epstein curettes. Once the complete discectomy was finished, we packed an appropriate sized interbody cage with local autograft and morcellized allograft, gently retracted the nerve root, and tapped the cage into position at L4-5.  The midline between the cages was packed with morselized autograft and allograft. We then turned our attention to the placement of the lower pedicle screws. The pedicle screw entry zones were identified utilizing surface landmarks and fluoroscopy. I drilled into each pedicle utilizing the hand drill, and tapped each pedicle with the appropriate tap. We palpated with a ball probe to assure no break in the cortex. We then placed 5.5 x 45 mm cortical pedicle screws into the pedicles bilaterally at L5. We then decorticated the transverse processes and laid a mixture of morcellized autograft and allograft out over these to perform intertransverse arthrodesis at L4-5. We then placed lordotic rods into the multiaxial screw heads of the pedicle screws and locked these in position with the locking caps and anti-torque device. We then checked our construct with AP and lateral fluoroscopy. Irrigated with copious amounts of bacitracin-containing saline solution. Inspected the nerve roots once again to assure adequate decompression, lined to the dura with Gelfoam, placed powdered vancomycin into the wound, and closed the muscle and the fascia with 0 Vicryl. Closed the subcutaneous tissues with 2-0 Vicryl and subcuticular tissues with 3-0 Vicryl. The skin was closed with benzoin and Steri-Strips. Dressing was then applied, the patient was awakened from general anesthesia and transported to the recovery room in stable condition. At the end of the procedure all sponge, needle and instrument counts were correct.   PLAN OF CARE: admit to inpatient  PATIENT DISPOSITION:  PACU - hemodynamically stable.   Delay start  of Pharmacological VTE agent (>24hrs) due to surgical blood loss or risk of bleeding:  yes

## 2017-08-02 NOTE — Anesthesia Procedure Notes (Signed)
Procedure Name: Intubation Date/Time: 08/02/2017 10:48 AM Performed by: Barrington Ellison, CRNA Pre-anesthesia Checklist: Patient identified, Emergency Drugs available, Suction available and Patient being monitored Patient Re-evaluated:Patient Re-evaluated prior to induction Oxygen Delivery Method: Circle System Utilized Preoxygenation: Pre-oxygenation with 100% oxygen Induction Type: IV induction Ventilation: Mask ventilation without difficulty Laryngoscope Size: Mac and 4 Grade View: Grade I Tube type: Oral Tube size: 7.5 mm Number of attempts: 1 Airway Equipment and Method: Stylet and Oral airway Placement Confirmation: ETT inserted through vocal cords under direct vision,  positive ETCO2 and breath sounds checked- equal and bilateral Secured at: 23 cm Tube secured with: Tape Dental Injury: Teeth and Oropharynx as per pre-operative assessment

## 2017-08-02 NOTE — Transfer of Care (Signed)
Immediate Anesthesia Transfer of Care Note  Patient: [name] [name]  Procedure(s) Performed: Posterior Lumbar Interbody Fusion - Lumbar four-five (N/A Back)  Patient Location: PACU  Anesthesia Type:General  Level of Consciousness: drowsy and patient cooperative  Airway & Oxygen Therapy: Patient Spontanous Breathing and Patient connected to nasal cannula oxygen  Post-op Assessment: Report given to RN and Patient moving all extremities X 4  Post vital signs: Reviewed and stable  Last Vitals:  Vitals Value Taken Time  BP 137/60 08/02/2017  2:20 PM  Temp    Pulse 72 08/02/2017  2:20 PM  Resp 10 08/02/2017  2:20 PM  SpO2 100 % 08/02/2017  2:20 PM  Vitals shown include unvalidated device data.  Last Pain:  Vitals:   08/02/17 0710  TempSrc: Oral         Complications: No apparent anesthesia complications

## 2017-08-03 ENCOUNTER — Other Ambulatory Visit: Payer: Self-pay

## 2017-08-03 MED ORDER — METHOCARBAMOL 500 MG PO TABS: 500.0000 mg | ORAL_TABLET | Freq: Four times a day (QID) | ORAL | 1 refills | Status: DC | PRN

## 2017-08-03 MED ORDER — HYDROCODONE-ACETAMINOPHEN 10-325 MG PO TABS: 1.0000 | ORAL_TABLET | ORAL | 0 refills | Status: DC | PRN

## 2017-08-03 MED FILL — Thrombin For Soln 20000 Unit: CUTANEOUS | Qty: 1 | Status: AC

## 2017-08-03 MED FILL — Thrombin For Soln 5000 Unit: CUTANEOUS | Qty: 5000 | Status: AC

## 2017-08-03 NOTE — Evaluation (Signed)
Occupational Therapy Evaluation Patient Details Name: [name] [name] MRN: [id number] DOB: 08-25-1945 Today's Date: 08/03/2017    History of Present Illness 72 y.o. male s/p L4-L5 PLIF 08/02/17. PMH includes: COPD, HTN.   Clinical Impression   This 72 y/o male presents with the above. At baseline pt is independent with ADLs and functional mobility, was active. Pt completing functional mobility and shower transfer without AD and minguard assist this session; currently requires MinA for toileting, ModA for LB ADLs secondary to adhering to back precautions. Reviewed and educated on back precautions, AE, safety and compensatory techniques for completing ADLs and functional transfers while adhering to precautions. Pt will return home with spouse who is able to assist with ADLs PRN. Questions answered throughout. Feel pt is safe to return home from OT standpoint with available spouse assist. No further acute OT needs identified at this time. Will sign off.     Follow Up Recommendations  No OT follow up;Supervision/Assistance - 24 hour    Equipment Recommendations  None recommended by OT           Precautions / Restrictions Precautions Precautions: Back Precaution Booklet Issued: Yes (comment) Precaution Comments: handout issued previously by PT; pt able to verbalize 3/3 back precautions  Required Braces or Orthoses: Spinal Brace Spinal Brace: Lumbar corset;Applied in sitting position Restrictions Weight Bearing Restrictions: No      Mobility Bed Mobility Overal bed mobility: Needs Assistance Bed Mobility: Rolling;Sidelying to Sit;Sit to Sidelying Rolling: Supervision Sidelying to sit: Supervision     Sit to sidelying: Min guard General bed mobility comments: pt with good recall of log roll technique, supervision to minguard for safety, increased effort when returning to sidelying though no physical assist required   Transfers Overall transfer level: Needs assistance Equipment  used: None Transfers: Sit to/from Stand Sit to Stand: Supervision         General transfer comment: supervision for safety     Balance Overall balance assessment: Mild deficits observed, not formally tested                                         ADL either performed or assessed with clinical judgement   ADL Overall ADL's : Needs assistance/impaired Eating/Feeding: Modified independent;Sitting   Grooming: Min guard;Standing   Upper Body Bathing: Min guard;Sitting   Lower Body Bathing: Minimal assistance;Sit to/from stand   Upper Body Dressing : Set up;Sitting;Minimal assistance Upper Body Dressing Details (indicate cue type and reason): pt able to don spinal brace with minA to properly position/center at lumbar region  Lower Body Dressing: Moderate assistance;Sit to/from stand Lower Body Dressing Details (indicate cue type and reason): educated on use of reacher for completing LB dressing with pt return demonstrating with min verbal cues; educated to have spouse assist with compression stockings  Toilet Transfer: Supervision/safety;Ambulation;BSC Toilet Transfer Details (indicate cue type and reason): BSC over toilet; simulated in transfer to/from EOB  Toileting- Clothing Manipulation and Hygiene: Minimal assistance;Sit to/from stand Toileting - Clothing Manipulation Details (indicate cue type and reason): pt reports difficulty completing peri-care without breaking back precautions; educated and demonstrated use of toilet aide and discussed additional options for safe task completion, pt reports will use handheld shower head in shower as bidet if necessary as well if unsuccessful with toilet aid  Tub/ Shower Transfer: Walk-in shower;Min guard;Ambulation;3 in 1 Tub/Shower Transfer Details (indicate cue type and reason): educated on use  of 3:1 as shower seat for increased safety and ease of reaching LEs during task completion; pt demonstrating walk-in shower transfer  with minguard for safety  Functional mobility during ADLs: Supervision/safety General ADL Comments: reviewed and educated on back precautions, AE, safety and compensatory techniques for completing ADLs and functional transfers                          Pertinent Vitals/Pain Pain Assessment: Faces Faces Pain Scale: Hurts a little bit Pain Location: lumbar region  Pain Descriptors / Indicators: Discomfort;Guarding Pain Intervention(s): Monitored during session;Repositioned          Extremity/Trunk Assessment Upper Extremity Assessment Upper Extremity Assessment: Overall WFL for tasks assessed   Lower Extremity Assessment Lower Extremity Assessment: Defer to PT evaluation   Cervical / Trunk Assessment Cervical / Trunk Assessment: Other exceptions Cervical / Trunk Exceptions: s/p lumbar surgery    Communication Communication Communication: No difficulties   Cognition Arousal/Alertness: Awake/alert Behavior During Therapy: WFL for tasks assessed/performed Overall Cognitive Status: Within Functional Limits for tasks assessed                                                      Home Living Family/patient expects to be discharged to:: Private residence Living Arrangements: Spouse/significant other Available Help at Discharge: Family;Available 24 hours/day Type of Home: House Home Access: Stairs to enter   Entrance Stairs-Rails: Left;Right Home Layout: One level     Bathroom Shower/Tub: Occupational psychologist: Standard     Home Equipment: Environmental consultant - 2 wheels;Cane - single point;Bedside commode;Adaptive equipment Adaptive Equipment: Reacher        Prior Functioning/Environment Level of Independence: Independent        Comments: active, works out in gym        OT Problem List: Decreased activity tolerance;Decreased knowledge of precautions;Decreased knowledge of use of DME or AE            OT Goals(Current goals can be  found in the care plan section) Acute Rehab OT Goals Patient Stated Goal: return home today  OT Goal Formulation: All assessment and education complete, DC therapy                                 AM-PAC PT "6 Clicks" Daily Activity     Outcome Measure Help from another person eating meals?: None Help from another person taking care of personal grooming?: None Help from another person toileting, which includes using toliet, bedpan, or urinal?: A Lot Help from another person bathing (including washing, rinsing, drying)?: A Little Help from another person to put on and taking off regular upper body clothing?: None Help from another person to put on and taking off regular lower body clothing?: A Lot 6 Click Score: 19   End of Session Equipment Utilized During Treatment: Back brace;Gait belt Nurse Communication: Mobility status  Activity Tolerance: Patient tolerated treatment well Patient left: with family/visitor present;Other (comment)(handoff to PT for start of session )  OT Visit Diagnosis: Other abnormalities of gait and mobility (R26.89)                Time: 4403-4742 OT Time Calculation (min): 22 min Charges:  OT General Charges $OT Visit:  1 Visit OT Evaluation $OT Eval Low Complexity: 1 Low G-Codes:     Lou Cal, OT Pager [phone] 08/03/2017   Raymondo Band 08/03/2017, 10:26 AM

## 2017-08-03 NOTE — Discharge Summary (Signed)
Physician Discharge Summary  Patient ID: [name] [name] MRN: [id number] DOB/AGE: [birth year] 72 y.o.  Admit date: 08/02/2017 Discharge date: 08/03/2017  Admission Diagnoses: spondy with synovial cyst L4-5    Discharge Diagnoses: same   Discharged Condition: good  Hospital Course: The patient was admitted on 08/02/2017 and taken to the operating room where the patient underwent PLIF L4-5. The patient tolerated the procedure well and was taken to the recovery room and then to the floor in stable condition. The hospital course was routine. There were no complications. The wound remained clean dry and intact. Pt had appropriate back soreness. No complaints of leg pain or new N/T/W. The patient remained afebrile with stable vital signs, and tolerated a regular diet. The patient continued to increase activities, and pain was well controlled with oral pain medications.   Consults: None  Significant Diagnostic Studies:  Results for orders placed or performed during the hospital encounter of 07/24/17  Surgical pcr screen  Result Value Ref Range   MRSA, PCR NEGATIVE NEGATIVE   Staphylococcus aureus NEGATIVE NEGATIVE  Basic metabolic panel  Result Value Ref Range   Sodium 141 135 - 145 mmol/L   Potassium 4.4 3.5 - 5.1 mmol/L   Chloride 106 101 - 111 mmol/L   CO2 27 22 - 32 mmol/L   Glucose, Bld 101 (H) 65 - 99 mg/dL   BUN 19 6 - 20 mg/dL   Creatinine, Ser 1.29 (H) 0.61 - 1.24 mg/dL   Calcium 9.2 8.9 - 10.3 mg/dL   GFR calc non Af Amer 54 (L) >60 mL/min   GFR calc Af Amer >60 >60 mL/min   Anion gap 8 5 - 15  CBC WITH DIFFERENTIAL  Result Value Ref Range   WBC 9.0 4.0 - 10.5 K/uL   RBC 4.71 4.22 - 5.81 MIL/uL   Hemoglobin 14.7 13.0 - 17.0 g/dL   HCT 44.1 39.0 - 52.0 %   MCV 93.6 78.0 - 100.0 fL   MCH 31.2 26.0 - 34.0 pg   MCHC 33.3 30.0 - 36.0 g/dL   RDW 13.3 11.5 - 15.5 %   Platelets 321 150 - 400 K/uL   Neutrophils Relative % 69 %   Neutro Abs 6.3 1.7 - 7.7 K/uL   Lymphocytes  Relative 23 %   Lymphs Abs 2.0 0.7 - 4.0 K/uL   Monocytes Relative 6 %   Monocytes Absolute 0.5 0.1 - 1.0 K/uL   Eosinophils Relative 2 %   Eosinophils Absolute 0.2 0.0 - 0.7 K/uL   Basophils Relative 0 %   Basophils Absolute 0.0 0.0 - 0.1 K/uL  Protime-INR  Result Value Ref Range   Prothrombin Time 13.0 11.4 - 15.2 seconds   INR 0.99   Type and screen [place]  Result Value Ref Range   ABO/RH(D) A POS    Antibody Screen NEG    Sample Expiration 08/07/2017    Extend sample reason      NO TRANSFUSIONS OR PREGNANCY IN THE PAST 3 MONTHS Performed at [facility], [address] [place], [place] [address]     Chest 2 View  Result Date: 07/24/2017 CLINICAL DATA:  Preoperative study prior to spinal fusion. History of thoracic surgery for empyema treatment in 2016; recent URI. History of COPD, former smoker. EXAM: CHEST - 2 VIEW COMPARISON:  Chest x-ray of April 06, 2017 FINDINGS: The lungs are mildly hyperinflated. There is stable volume loss at the right lung base with blunting of the costophrenic angles. The  heart and pulmonary vascularity are normal. There is no mediastinal or hilar lymphadenopathy. There is mild multilevel degenerative disc disease of the thoracic spine with anterior bridging osteophytes. IMPRESSION: COPD. No acute pneumonia nor CHF. Stable scarring at the right lung base. Electronically Signed   By: Daryn Hicks  [place] M.D.   On: 07/24/2017 15:23   Dg Lumbar Spine 2-3 Views  Result Date: 08/02/2017 CLINICAL DATA:  Posterior lumbar fusion at L4-5 EXAM: DG C-ARM 61-120 MIN; LUMBAR SPINE - 2-3 VIEW COMPARISON:  None. FLUOROSCOPY TIME:  Fluoroscopy Time:  1 minutes 17 seconds Radiation Exposure Index (if provided by the fluoroscopic device): Not available Number of Acquired Spot Images: 2 FINDINGS: Frontal and lateral spot films were obtained intraoperatively and reveal interbody fusion at L4-5 with pedicle screws and posterior fixation. No other focal  abnormality is noted. IMPRESSION: L4-5 interbody fusion. Electronically Signed   By: Inez Catalina M.D.   On: 08/02/2017 14:16   Dg C-arm 1-60 Min  Result Date: 08/02/2017 CLINICAL DATA:  Posterior lumbar fusion at L4-5 EXAM: DG C-ARM 61-120 MIN; LUMBAR SPINE - 2-3 VIEW COMPARISON:  None. FLUOROSCOPY TIME:  Fluoroscopy Time:  1 minutes 17 seconds Radiation Exposure Index (if provided by the fluoroscopic device): Not available Number of Acquired Spot Images: 2 FINDINGS: Frontal and lateral spot films were obtained intraoperatively and reveal interbody fusion at L4-5 with pedicle screws and posterior fixation. No other focal abnormality is noted. IMPRESSION: L4-5 interbody fusion. Electronically Signed   By: Inez Catalina M.D.   On: 08/02/2017 14:16    Antibiotics:  Anti-infectives (From admission, onward)   Start     Dose/Rate Route Frequency Ordered Stop   08/02/17 1800  ceFAZolin (ANCEF) IVPB 2g/100 mL premix     2 g 200 mL/hr over 30 Minutes Intravenous Every 8 hours 08/02/17 1551 08/03/17 0148   08/02/17 1141  vancomycin (VANCOCIN) powder  Status:  Discontinued       As needed 08/02/17 1142 08/02/17 1416   08/02/17 1139  bacitracin 50,000 Units in sodium chloride 0.9 % 500 mL irrigation  Status:  Discontinued       As needed 08/02/17 1140 08/02/17 1416   08/02/17 0715  ceFAZolin (ANCEF) IVPB 2g/100 mL premix     2 g 200 mL/hr over 30 Minutes Intravenous On call to O.R. 08/02/17 0705 08/02/17 1050      Discharge Exam: Blood pressure (!) 150/67, pulse 67, temperature 97.6 F (36.4 C), temperature source Oral, resp. rate 20, height 5\' 11"  (1.803 m), weight 87.5 kg (193 lb), SpO2 99 %. Neurologic: Grossly normal Dressing dry  Discharge Medications:   Allergies as of 08/03/2017   No Known Allergies     Medication List    TAKE these medications   ACIDOPHILUS PROBIOTIC 10 MG Tabs Take 10 mg by mouth daily.   ALPRAZolam 0.25 MG tablet Commonly known as:  XANAX Take 0.25 mg by mouth  at bedtime as needed for sleep.   amLODipine 5 MG tablet Commonly known as:  NORVASC Take 5 mg by mouth daily.   aspirin EC 81 MG tablet Take 81 mg by mouth daily.   CALCIUM CITRATE + D3 PO Take 1 tablet by mouth daily.   HYDROcodone-acetaminophen 10-325 MG tablet Commonly known as:  NORCO Take 1 tablet by mouth every 4 (four) hours as needed for severe pain ((score 7 to 10)).   Krill Oil 350 MG Caps Take 350 mg by mouth daily.   methocarbamol 500 MG tablet Commonly known as:  ROBAXIN Take 1 tablet (500 mg total) by mouth every 6 (six) hours as needed for muscle spasms.   multivitamins ther. w/minerals Tabs tablet Take 1 tablet by mouth daily.   NON FORMULARY Apply 1 application topically daily. Fluc2% Toe Nail Drops for Fungus   PHYTOSTEROLS PO Take 1 tablet by mouth 2 (two) times daily.   polyethylene glycol-electrolytes 420 g solution Commonly known as:  NuLYTELY/GoLYTELY Take 4,000 mLs by mouth once.   rosuvastatin 5 MG tablet Commonly known as:  CRESTOR Take 5 mg by mouth daily.   tamsulosin 0.4 MG Caps capsule Commonly known as:  FLOMAX Take 0.4 mg by mouth daily.   testosterone 50 MG/5GM (1%) Gel Commonly known as:  ANDROGEL Place 5 g onto the skin daily.            Durable Medical Equipment  (From admission, onward)        Start     Ordered   08/02/17 1552  DME Walker rolling  Once    Question:  Patient needs a walker to treat with the following condition  Answer:  S/P lumbar fusion   08/02/17 1551   08/02/17 1552  DME 3 n 1  Once     08/02/17 1551      Disposition: HOME   Final Dx: PLIF l4-5  Discharge Instructions     Remove dressing in 72 hours   Complete by:  As directed    Call MD for:  difficulty breathing, headache or visual disturbances   Complete by:  As directed    Call MD for:  persistant nausea and vomiting   Complete by:  As directed    Call MD for:  redness, tenderness, or signs of infection (pain, swelling, redness,  odor or green/yellow discharge around incision site)   Complete by:  As directed    Call MD for:  severe uncontrolled pain   Complete by:  As directed    Call MD for:  temperature >100.4   Complete by:  As directed    Diet - low sodium heart healthy   Complete by:  As directed    Increase activity slowly   Complete by:  As directed       Follow-up Information    Eustace Moore, MD. Schedule an appointment as soon as possible for a visit in 2 week(s).   Specialty:  Neurosurgery Contact information: [address] [place] [address] [place] [address] [phone]            Signed: Eustace Moore 08/03/2017, 7:45 AM

## 2017-08-03 NOTE — Progress Notes (Signed)
Pt doing well. Pt and wife given D/C instructions with Rx's, verbal understanding was provided. Pt's incision is clean and dry with no sign of infection. Pt's IV was removed prior to D/C. Pt D/C'd home via wheelchair per MD order. Pt is stable @ D/C and has no other needs at this time. Roena Sassaman, RN  

## 2017-08-03 NOTE — Progress Notes (Signed)
Physical Therapy Treatment Patient Details Name: [name] [name] MRN: [id number] DOB: [birth year] Today's Date: 08/03/2017    History of Present Illness 72 y.o. male s/p L4-L5 PLIF 08/02/17. PMH includes: COPD, HTN.    PT Comments    Pt progressing towards physical therapy goals. Was able to perform transfers and ambulation with gross supervision for safety. Pt was educated on safety, precautions, activity progression, and car transfer. Wife present for education as well. Will continue to follow and progress as able per POC.    Follow Up Recommendations  No PT follow up     Equipment Recommendations  None recommended by PT    Recommendations for Other Services       Precautions / Restrictions Precautions Precautions: Back Precaution Booklet Issued: Yes (comment) Precaution Comments: Handout reviewed in detail. Pt was cued for precautions during functional mobility.  Required Braces or Orthoses: Spinal Brace Spinal Brace: Lumbar corset;Applied in sitting position Restrictions Weight Bearing Restrictions: No    Mobility  Bed Mobility               General bed mobility comments: Pt sitting EOB when PT arrived.   Transfers Overall transfer level: Needs assistance Equipment used: None Transfers: Sit to/from Stand Sit to Stand: Supervision         General transfer comment: supervision for safety. VC's for hand placement on seated surface for safety.  Ambulation/Gait Ambulation/Gait assistance: Supervision Ambulation Distance (Feet): 300 Feet Assistive device: None Gait Pattern/deviations: Step-through pattern;Decreased stride length Gait velocity: decreased    General Gait Details: VC's for improved posture and general safety. Pt slightly unsteady at times but no overt LOB noted.    Stairs   Stairs assistance: Supervision Stair Management: One rail Right;Forwards;Step to pattern Number of Stairs: 10 General stair comments: VC's for sequencing and general  safety.    Wheelchair Mobility    Modified Rankin (Stroke Patients Only)       Balance Overall balance assessment: Mild deficits observed, not formally tested                                          Cognition Arousal/Alertness: Awake/alert Behavior During Therapy: WFL for tasks assessed/performed Overall Cognitive Status: Within Functional Limits for tasks assessed                                        Exercises      General Comments        Pertinent Vitals/Pain Pain Assessment: Faces Faces Pain Scale: Hurts a little bit Pain Location: lumbar region  Pain Descriptors / Indicators: Discomfort;Guarding Pain Intervention(s): Monitored during session    Home Living                      Prior Function            PT Goals (current goals can now be found in the care plan section) Acute Rehab PT Goals Patient Stated Goal: return home today  PT Goal Formulation: With patient Time For Goal Achievement: 08/09/17 Potential to Achieve Goals: Good Progress towards PT goals: Progressing toward goals    Frequency    Min 3X/week      PT Plan Current plan remains appropriate    Co-evaluation  AM-PAC PT "6 Clicks" Daily Activity  Outcome Measure  Difficulty turning over in bed (including adjusting bedclothes, sheets and blankets)?: A Little Difficulty moving from lying on back to sitting on the side of the bed? : A Little Difficulty sitting down on and standing up from a chair with arms (e.g., wheelchair, bedside commode, etc,.)?: A Little Help needed moving to and from a bed to chair (including a wheelchair)?: A Little Help needed walking in hospital room?: A Little Help needed climbing 3-5 steps with a railing? : A Little 6 Click Score: 18    End of Session Equipment Utilized During Treatment: Gait belt Activity Tolerance: Patient tolerated treatment well Patient left: in bed;with call bell/phone  within reach(Sitting EOB) Nurse Communication: Mobility status PT Visit Diagnosis: Unsteadiness on feet (R26.81);Pain Pain - part of body: (lumbar)     Time: 0964-3838 PT Time Calculation (min) (ACUTE ONLY): 17 min  Charges:  $Gait Training: 8-22 mins                    G Codes:       Rolinda Roan, PT, DPT Acute Rehabilitation Services Pager: [phone]    Thelma Comp 08/03/2017, 2:35 PM

## 2017-08-03 NOTE — Discharge Instructions (Signed)
Wound Care Keep incision covered and dry for 2-3 days.  You may shower today. Once dressing starts to peel off, please remove. Do not put any creams, lotions, or ointments on incision. Leave steri-strips on neck.  They will fall off by themselves. Activity Walk each and every day, increasing distance each day. No lifting greater than 5 lbs.  Avoid bending, arching, or twisting. No driving for 2 weeks; may ride as a passenger locally. If provided with back brace, wear when out of bed.  It is not necessary to wear in bed. Diet Resume your normal diet.  Return to Work Will be discussed at you follow up appointment. Call Your Doctor If Any of These Occur Redness, drainage, or swelling at the wound.  Temperature greater than 101 degrees. Severe pain not relieved by pain medication. Incision starts to come apart. Follow Up Appt Call today for appointment in 1-2 weeks ([phone]) or for problems.  If you have any hardware placed in your spine, you will need an x-ray before your appointment.

## 2017-09-05 ENCOUNTER — Other Ambulatory Visit (HOSPITAL_COMMUNITY): Payer: Self-pay | Admitting: Pulmonary Disease

## 2017-09-05 DIAGNOSIS — J449 Chronic obstructive pulmonary disease, unspecified: Secondary | ICD-10-CM

## 2017-09-05 DIAGNOSIS — R911 Solitary pulmonary nodule: Secondary | ICD-10-CM

## 2017-09-18 DIAGNOSIS — M4317 Spondylolisthesis, lumbosacral region: Secondary | ICD-10-CM | POA: Diagnosis not present

## 2017-09-18 DIAGNOSIS — I10 Essential (primary) hypertension: Secondary | ICD-10-CM | POA: Diagnosis not present

## 2017-09-18 DIAGNOSIS — Z6826 Body mass index (BMI) 26.0-26.9, adult: Secondary | ICD-10-CM | POA: Diagnosis not present

## 2017-09-19 ENCOUNTER — Ambulatory Visit (HOSPITAL_COMMUNITY)
Admission: RE | Admit: 2017-09-19 | Discharge: 2017-09-19 | Disposition: A | Discharge status: Home / Self Care | Payer: Medicare Other | Source: Ambulatory Visit | Attending: Pulmonary Disease | Admitting: Pulmonary Disease

## 2017-09-19 DIAGNOSIS — J449 Chronic obstructive pulmonary disease, unspecified: Secondary | ICD-10-CM | POA: Diagnosis not present

## 2017-09-19 DIAGNOSIS — J439 Emphysema, unspecified: Secondary | ICD-10-CM | POA: Diagnosis not present

## 2017-09-19 DIAGNOSIS — R911 Solitary pulmonary nodule: Secondary | ICD-10-CM

## 2017-09-19 DIAGNOSIS — I700 Atherosclerosis of aorta: Secondary | ICD-10-CM | POA: Insufficient documentation

## 2017-09-19 DIAGNOSIS — J984 Other disorders of lung: Secondary | ICD-10-CM | POA: Diagnosis not present

## 2017-09-19 DIAGNOSIS — I2510 Atherosclerotic heart disease of native coronary artery without angina pectoris: Secondary | ICD-10-CM | POA: Diagnosis not present

## 2017-09-27 ENCOUNTER — Encounter (HOSPITAL_COMMUNITY): Payer: Self-pay | Admitting: Emergency Medicine

## 2017-09-27 ENCOUNTER — Inpatient Hospital Stay (HOSPITAL_COMMUNITY)
Admission: EM | Admit: 2017-09-27 | Discharge: 2017-10-04 | DRG: 246 | Disposition: A | Discharge status: Home / Self Care | Payer: Medicare Other | Source: Other Acute Inpatient Hospital | Attending: Cardiovascular Disease | Admitting: Cardiovascular Disease

## 2017-09-27 ENCOUNTER — Inpatient Hospital Stay (HOSPITAL_COMMUNITY): Payer: Medicare Other

## 2017-09-27 ENCOUNTER — Emergency Department (HOSPITAL_COMMUNITY): Payer: Medicare Other

## 2017-09-27 ENCOUNTER — Other Ambulatory Visit: Payer: Self-pay

## 2017-09-27 ENCOUNTER — Inpatient Hospital Stay (HOSPITAL_COMMUNITY)
Admission: EM | Disposition: A | Discharge status: Home / Self Care | Payer: Self-pay | Source: Other Acute Inpatient Hospital | Attending: Cardiovascular Disease

## 2017-09-27 DIAGNOSIS — Z87891 Personal history of nicotine dependence: Secondary | ICD-10-CM

## 2017-09-27 DIAGNOSIS — R0602 Shortness of breath: Secondary | ICD-10-CM

## 2017-09-27 DIAGNOSIS — I255 Ischemic cardiomyopathy: Secondary | ICD-10-CM | POA: Diagnosis not present

## 2017-09-27 DIAGNOSIS — N183 Chronic kidney disease, stage 3 (moderate): Secondary | ICD-10-CM | POA: Diagnosis not present

## 2017-09-27 DIAGNOSIS — Z8701 Personal history of pneumonia (recurrent): Secondary | ICD-10-CM | POA: Diagnosis not present

## 2017-09-27 DIAGNOSIS — K5900 Constipation, unspecified: Secondary | ICD-10-CM | POA: Diagnosis not present

## 2017-09-27 DIAGNOSIS — J432 Centrilobular emphysema: Secondary | ICD-10-CM | POA: Diagnosis not present

## 2017-09-27 DIAGNOSIS — Z7982 Long term (current) use of aspirin: Secondary | ICD-10-CM

## 2017-09-27 DIAGNOSIS — Z8679 Personal history of other diseases of the circulatory system: Secondary | ICD-10-CM | POA: Diagnosis not present

## 2017-09-27 DIAGNOSIS — M5489 Other dorsalgia: Secondary | ICD-10-CM | POA: Diagnosis not present

## 2017-09-27 DIAGNOSIS — I130 Hypertensive heart and chronic kidney disease with heart failure and stage 1 through stage 4 chronic kidney disease, or unspecified chronic kidney disease: Secondary | ICD-10-CM | POA: Diagnosis present

## 2017-09-27 DIAGNOSIS — J810 Acute pulmonary edema: Secondary | ICD-10-CM

## 2017-09-27 DIAGNOSIS — I2102 ST elevation (STEMI) myocardial infarction involving left anterior descending coronary artery: Secondary | ICD-10-CM

## 2017-09-27 DIAGNOSIS — Z8249 Family history of ischemic heart disease and other diseases of the circulatory system: Secondary | ICD-10-CM

## 2017-09-27 DIAGNOSIS — R1013 Epigastric pain: Secondary | ICD-10-CM | POA: Diagnosis not present

## 2017-09-27 DIAGNOSIS — I4891 Unspecified atrial fibrillation: Secondary | ICD-10-CM | POA: Diagnosis not present

## 2017-09-27 DIAGNOSIS — Z955 Presence of coronary angioplasty implant and graft: Secondary | ICD-10-CM

## 2017-09-27 DIAGNOSIS — Z79899 Other long term (current) drug therapy: Secondary | ICD-10-CM

## 2017-09-27 DIAGNOSIS — N400 Enlarged prostate without lower urinary tract symptoms: Secondary | ICD-10-CM | POA: Diagnosis not present

## 2017-09-27 DIAGNOSIS — Z9049 Acquired absence of other specified parts of digestive tract: Secondary | ICD-10-CM | POA: Diagnosis not present

## 2017-09-27 DIAGNOSIS — J438 Other emphysema: Secondary | ICD-10-CM | POA: Diagnosis present

## 2017-09-27 DIAGNOSIS — Z7901 Long term (current) use of anticoagulants: Secondary | ICD-10-CM | POA: Diagnosis not present

## 2017-09-27 DIAGNOSIS — Z86010 Personal history of colonic polyps: Secondary | ICD-10-CM | POA: Diagnosis not present

## 2017-09-27 DIAGNOSIS — Z831 Family history of other infectious and parasitic diseases: Secondary | ICD-10-CM

## 2017-09-27 DIAGNOSIS — J439 Emphysema, unspecified: Secondary | ICD-10-CM | POA: Diagnosis not present

## 2017-09-27 DIAGNOSIS — J449 Chronic obstructive pulmonary disease, unspecified: Secondary | ICD-10-CM | POA: Diagnosis not present

## 2017-09-27 DIAGNOSIS — I213 ST elevation (STEMI) myocardial infarction of unspecified site: Secondary | ICD-10-CM

## 2017-09-27 DIAGNOSIS — R0609 Other forms of dyspnea: Secondary | ICD-10-CM

## 2017-09-27 DIAGNOSIS — Z825 Family history of asthma and other chronic lower respiratory diseases: Secondary | ICD-10-CM

## 2017-09-27 DIAGNOSIS — I2510 Atherosclerotic heart disease of native coronary artery without angina pectoris: Secondary | ICD-10-CM | POA: Diagnosis not present

## 2017-09-27 DIAGNOSIS — Z981 Arthrodesis status: Secondary | ICD-10-CM | POA: Diagnosis not present

## 2017-09-27 DIAGNOSIS — I5021 Acute systolic (congestive) heart failure: Secondary | ICD-10-CM | POA: Diagnosis not present

## 2017-09-27 DIAGNOSIS — I480 Paroxysmal atrial fibrillation: Secondary | ICD-10-CM | POA: Diagnosis not present

## 2017-09-27 DIAGNOSIS — Z8709 Personal history of other diseases of the respiratory system: Secondary | ICD-10-CM | POA: Diagnosis not present

## 2017-09-27 DIAGNOSIS — I509 Heart failure, unspecified: Secondary | ICD-10-CM

## 2017-09-27 DIAGNOSIS — R0902 Hypoxemia: Secondary | ICD-10-CM | POA: Diagnosis not present

## 2017-09-27 DIAGNOSIS — J811 Chronic pulmonary edema: Secondary | ICD-10-CM

## 2017-09-27 DIAGNOSIS — R0789 Other chest pain: Secondary | ICD-10-CM | POA: Diagnosis not present

## 2017-09-27 DIAGNOSIS — R079 Chest pain, unspecified: Secondary | ICD-10-CM | POA: Diagnosis not present

## 2017-09-27 DIAGNOSIS — I700 Atherosclerosis of aorta: Secondary | ICD-10-CM | POA: Diagnosis not present

## 2017-09-27 DIAGNOSIS — J181 Lobar pneumonia, unspecified organism: Secondary | ICD-10-CM | POA: Diagnosis not present

## 2017-09-27 HISTORY — DX: Acute systolic (congestive) heart failure: I50.21

## 2017-09-27 HISTORY — DX: Paroxysmal atrial fibrillation: I48.0

## 2017-09-27 HISTORY — DX: Hyperlipidemia, unspecified: E78.5

## 2017-09-27 HISTORY — DX: ST elevation (STEMI) myocardial infarction of unspecified site: I21.3

## 2017-09-27 HISTORY — PX: CORONARY ULTRASOUND/IVUS: CATH118244

## 2017-09-27 HISTORY — PX: CORONARY/GRAFT ACUTE MI REVASCULARIZATION: CATH118305

## 2017-09-27 HISTORY — PX: LEFT HEART CATH AND CORONARY ANGIOGRAPHY: CATH118249

## 2017-09-27 LAB — I-STAT CREATININE, ED: Creatinine, Ser: 1.5 mg/dL — ABNORMAL HIGH (ref 0.61–1.24)

## 2017-09-27 LAB — CBC WITH DIFFERENTIAL/PLATELET
Basophils Absolute: 0.1 10*3/uL (ref 0.0–0.1)
Basophils Relative: 1 %
Eosinophils Absolute: 0.2 10*3/uL (ref 0.0–0.7)
Eosinophils Relative: 1 %
HCT: 45.8 % (ref 39.0–52.0)
Hemoglobin: 15.4 g/dL (ref 13.0–17.0)
Lymphocytes Relative: 30 %
Lymphs Abs: 4.1 10*3/uL — ABNORMAL HIGH (ref 0.7–4.0)
MCH: 31.7 pg (ref 26.0–34.0)
MCHC: 33.6 g/dL (ref 30.0–36.0)
MCV: 94.2 fL (ref 78.0–100.0)
Monocytes Absolute: 1.2 10*3/uL — ABNORMAL HIGH (ref 0.1–1.0)
Monocytes Relative: 8 %
Neutro Abs: 8.3 10*3/uL — ABNORMAL HIGH (ref 1.7–7.7)
Neutrophils Relative %: 60 %
Platelets: 262 10*3/uL (ref 150–400)
RBC: 4.86 MIL/uL (ref 4.22–5.81)
RDW: 13.7 % (ref 11.5–15.5)
WBC: 13.8 10*3/uL — ABNORMAL HIGH (ref 4.0–10.5)

## 2017-09-27 LAB — I-STAT CHEM 8, ED
BUN: 18 mg/dL (ref 8–23)
CREATININE: 1.4 mg/dL — AB (ref 0.61–1.24)
Calcium, Ion: 1.06 mmol/L — ABNORMAL LOW (ref 1.15–1.40)
Chloride: 102 mmol/L (ref 98–111)
GLUCOSE: 183 mg/dL — AB (ref 70–99)
HEMATOCRIT: 47 % (ref 39.0–52.0)
HEMOGLOBIN: 16 g/dL (ref 13.0–17.0)
Potassium: 3.7 mmol/L (ref 3.5–5.1)
Sodium: 140 mmol/L (ref 135–145)
TCO2: 22 mmol/L (ref 22–32)

## 2017-09-27 LAB — I-STAT TROPONIN, ED: Troponin i, poc: 0.33 ng/mL (ref 0.00–0.08)

## 2017-09-27 LAB — ECHOCARDIOGRAM COMPLETE
Height: 72 in
Weight: 3088 oz

## 2017-09-27 LAB — POCT ACTIVATED CLOTTING TIME: Activated Clotting Time: 499 seconds

## 2017-09-27 LAB — TROPONIN I: Troponin I: >65 ng/mL (ref <0.03)

## 2017-09-27 LAB — MRSA PCR SCREENING: MRSA by PCR: NEGATIVE

## 2017-09-27 SURGERY — LEFT HEART CATH AND CORONARY ANGIOGRAPHY
Anesthesia: LOCAL

## 2017-09-27 MED ORDER — TIROFIBAN (AGGRASTAT) BOLUS VIA INFUSION
INTRAVENOUS | Status: DC | PRN
  Administered 2017-09-27: 2187.5 ug via INTRAVENOUS

## 2017-09-27 MED ORDER — ASPIRIN EC 81 MG PO TBEC
DELAYED_RELEASE_TABLET | ORAL | Status: AC
  Administered 2017-09-27: 324 mg
  Filled 2017-09-27: qty 4

## 2017-09-27 MED ORDER — IOPAMIDOL (ISOVUE-370) INJECTION 76%
100.0000 mL | Freq: Once | INTRAVENOUS | Status: AC | PRN
  Administered 2017-09-27: 100 mL via INTRAVENOUS

## 2017-09-27 MED ORDER — TICAGRELOR 90 MG PO TABS
ORAL_TABLET | ORAL | Status: AC
  Filled 2017-09-27: qty 1

## 2017-09-27 MED ORDER — SODIUM CHLORIDE 0.9% FLUSH
3.0000 mL | Freq: Two times a day (BID) | INTRAVENOUS | Status: DC
  Administered 2017-09-27 – 2017-10-04 (×11): 3 mL via INTRAVENOUS

## 2017-09-27 MED ORDER — VERAPAMIL HCL 2.5 MG/ML IV SOLN
INTRAVENOUS | Status: AC
  Filled 2017-09-27: qty 2

## 2017-09-27 MED ORDER — ONDANSETRON HCL 4 MG/2ML IJ SOLN
4.0000 mg | Freq: Four times a day (QID) | INTRAMUSCULAR | Status: DC | PRN
  Administered 2017-09-27 – 2017-09-28 (×3): 4 mg via INTRAVENOUS
  Filled 2017-09-27 (×3): qty 2

## 2017-09-27 MED ORDER — TIROFIBAN HCL IV 12.5 MG/250 ML
INTRAVENOUS | Status: AC
  Filled 2017-09-27: qty 250

## 2017-09-27 MED ORDER — NITROGLYCERIN 1 MG/10 ML FOR IR/CATH LAB
INTRA_ARTERIAL | Status: AC
  Filled 2017-09-27: qty 10

## 2017-09-27 MED ORDER — IOPAMIDOL (ISOVUE-370) INJECTION 76%
INTRAVENOUS | Status: DC | PRN
  Administered 2017-09-27: 125 mL via INTRA_ARTERIAL

## 2017-09-27 MED ORDER — HYDRALAZINE HCL 20 MG/ML IJ SOLN: 5.0000 mg | INTRAMUSCULAR | Status: AC | PRN

## 2017-09-27 MED ORDER — LABETALOL HCL 5 MG/ML IV SOLN: 10.0000 mg | INTRAVENOUS | Status: AC | PRN

## 2017-09-27 MED ORDER — METOPROLOL TARTRATE 25 MG PO TABS
25.0000 mg | ORAL_TABLET | Freq: Two times a day (BID) | ORAL | Status: DC
  Administered 2017-09-27 (×2): 25 mg via ORAL
  Filled 2017-09-27 (×3): qty 1

## 2017-09-27 MED ORDER — HEPARIN (PORCINE) IN NACL 100-0.45 UNIT/ML-% IJ SOLN
INTRAMUSCULAR | Status: AC
  Filled 2017-09-27: qty 250

## 2017-09-27 MED ORDER — TIROFIBAN HCL IN NACL 5-0.9 MG/100ML-% IV SOLN
0.0750 ug/kg/min | INTRAVENOUS | Status: DC
  Filled 2017-09-27: qty 100

## 2017-09-27 MED ORDER — HEPARIN SODIUM (PORCINE) 1000 UNIT/ML IJ SOLN
INTRAMUSCULAR | Status: DC | PRN
  Administered 2017-09-27: 10000 [IU] via INTRAVENOUS

## 2017-09-27 MED ORDER — NITROGLYCERIN 1 MG/10 ML FOR IR/CATH LAB
INTRA_ARTERIAL | Status: DC | PRN
  Administered 2017-09-27 (×2): 200 ug via INTRACORONARY

## 2017-09-27 MED ORDER — SODIUM CHLORIDE 0.9% FLUSH: 3.0000 mL | INTRAVENOUS | Status: DC | PRN

## 2017-09-27 MED ORDER — TICAGRELOR 90 MG PO TABS
90.0000 mg | ORAL_TABLET | Freq: Two times a day (BID) | ORAL | Status: DC
  Administered 2017-09-27 – 2017-10-03 (×12): 90 mg via ORAL
  Filled 2017-09-27 (×13): qty 1

## 2017-09-27 MED ORDER — SODIUM CHLORIDE 0.9 % IV SOLN
250.0000 mL | INTRAVENOUS | Status: DC | PRN
  Administered 2017-09-27 – 2017-10-01 (×2): 250 mL via INTRAVENOUS

## 2017-09-27 MED ORDER — ACETAMINOPHEN 325 MG PO TABS
650.0000 mg | ORAL_TABLET | ORAL | Status: DC | PRN
  Administered 2017-09-27 – 2017-10-02 (×23): 650 mg via ORAL
  Filled 2017-09-27 (×24): qty 2

## 2017-09-27 MED ORDER — ASPIRIN 81 MG PO CHEW
324.0000 mg | CHEWABLE_TABLET | Freq: Once | ORAL | Status: AC
  Administered 2017-09-27: 324 mg via ORAL

## 2017-09-27 MED ORDER — MIDAZOLAM HCL 2 MG/2ML IJ SOLN
INTRAMUSCULAR | Status: DC | PRN
  Administered 2017-09-27: 1 mg via INTRAVENOUS

## 2017-09-27 MED ORDER — IOPAMIDOL (ISOVUE-370) INJECTION 76%
INTRAVENOUS | Status: AC
  Filled 2017-09-27: qty 125

## 2017-09-27 MED ORDER — NITROGLYCERIN IN D5W 200-5 MCG/ML-% IV SOLN
10.0000 ug/min | INTRAVENOUS | Status: DC
  Administered 2017-09-27: 10 ug/min via INTRAVENOUS
  Filled 2017-09-27: qty 250

## 2017-09-27 MED ORDER — HEPARIN SODIUM (PORCINE) 1000 UNIT/ML IJ SOLN
INTRAMUSCULAR | Status: AC
  Filled 2017-09-27: qty 1

## 2017-09-27 MED ORDER — FENTANYL CITRATE (PF) 100 MCG/2ML IJ SOLN
INTRAMUSCULAR | Status: AC
  Filled 2017-09-27: qty 2

## 2017-09-27 MED ORDER — TICAGRELOR 90 MG PO TABS
ORAL_TABLET | ORAL | Status: DC | PRN
  Administered 2017-09-27: 180 mg via ORAL

## 2017-09-27 MED ORDER — MIDAZOLAM HCL 2 MG/2ML IJ SOLN
INTRAMUSCULAR | Status: AC
  Filled 2017-09-27: qty 2

## 2017-09-27 MED ORDER — TAMSULOSIN HCL 0.4 MG PO CAPS
0.4000 mg | ORAL_CAPSULE | Freq: Every day | ORAL | Status: DC
  Administered 2017-09-27 – 2017-10-04 (×9): 0.4 mg via ORAL
  Filled 2017-09-27 (×8): qty 1

## 2017-09-27 MED ORDER — TIROFIBAN HCL IV 12.5 MG/250 ML
INTRAVENOUS | Status: AC | PRN
  Administered 2017-09-27: 0.075 ug/kg/min via INTRAVENOUS

## 2017-09-27 MED ORDER — SODIUM CHLORIDE 0.9 % IV SOLN
INTRAVENOUS | Status: AC
  Administered 2017-09-27: 75 mL via INTRAVENOUS

## 2017-09-27 MED ORDER — HEPARIN (PORCINE) IN NACL 1000-0.9 UT/500ML-% IV SOLN
INTRAVENOUS | Status: AC
  Filled 2017-09-27: qty 1000

## 2017-09-27 MED ORDER — HEPARIN (PORCINE) IN NACL 100-0.45 UNIT/ML-% IJ SOLN
1000.0000 [IU]/h | INTRAMUSCULAR | Status: DC
  Administered 2017-09-27: 1000 [IU]/h via INTRAVENOUS

## 2017-09-27 MED ORDER — LIDOCAINE HCL (PF) 1 % IJ SOLN
INTRAMUSCULAR | Status: AC
  Filled 2017-09-27: qty 30

## 2017-09-27 MED ORDER — TIROFIBAN HCL IV 12.5 MG/250 ML
0.0750 ug/kg/min | INTRAVENOUS | Status: AC
  Administered 2017-09-27: 0.075 ug/kg/min via INTRAVENOUS

## 2017-09-27 MED ORDER — FENTANYL CITRATE (PF) 100 MCG/2ML IJ SOLN
INTRAMUSCULAR | Status: DC | PRN
  Administered 2017-09-27: 50 ug via INTRAVENOUS

## 2017-09-27 MED ORDER — ROSUVASTATIN CALCIUM 40 MG PO TABS
40.0000 mg | ORAL_TABLET | Freq: Every day | ORAL | Status: DC
  Administered 2017-09-27 – 2017-10-04 (×8): 40 mg via ORAL
  Filled 2017-09-27 (×8): qty 1

## 2017-09-27 MED ORDER — HEPARIN BOLUS VIA INFUSION
4000.0000 [IU] | Freq: Once | INTRAVENOUS | Status: AC
  Administered 2017-09-27: 4000 [IU] via INTRAVENOUS

## 2017-09-27 MED ORDER — IOHEXOL 350 MG/ML SOLN
INTRAVENOUS | Status: DC | PRN
  Administered 2017-09-27: 5 mL via INTRACARDIAC

## 2017-09-27 MED ORDER — ALPRAZOLAM 0.25 MG PO TABS
0.2500 mg | ORAL_TABLET | Freq: Every evening | ORAL | Status: DC | PRN
  Administered 2017-09-27 – 2017-10-04 (×7): 0.25 mg via ORAL
  Filled 2017-09-27 (×8): qty 1

## 2017-09-27 MED ORDER — FENTANYL CITRATE (PF) 100 MCG/2ML IJ SOLN
50.0000 ug | Freq: Once | INTRAMUSCULAR | Status: AC
  Administered 2017-09-27: 50 ug via INTRAVENOUS

## 2017-09-27 MED ORDER — ASPIRIN EC 81 MG PO TBEC
81.0000 mg | DELAYED_RELEASE_TABLET | Freq: Every day | ORAL | Status: DC
  Administered 2017-09-28 – 2017-10-04 (×7): 81 mg via ORAL
  Filled 2017-09-27 (×8): qty 1

## 2017-09-27 SURGICAL SUPPLY — 20 items
BALLN SAPPHIRE 2.5X12 (BALLOONS) ×2
BALLN SAPPHIRE ~~LOC~~ 3.25X12 (BALLOONS) ×2 IMPLANT
BALLOON SAPPHIRE 2.5X12 (BALLOONS) ×1 IMPLANT
CATH INFINITI 5FR ANG PIGTAIL (CATHETERS) ×2 IMPLANT
CATH INFINITI JR4 5F (CATHETERS) ×2 IMPLANT
CATH OPTICROSS 40MHZ (CATHETERS) ×2 IMPLANT
CATH VISTA GUIDE 6FR XBLAD3.5 (CATHETERS) ×2 IMPLANT
DEVICE RAD COMP TR BAND LRG (VASCULAR PRODUCTS) ×2 IMPLANT
GLIDESHEATH SLEND SS 6F .021 (SHEATH) ×2 IMPLANT
GUIDEWIRE INQWIRE 1.5J.035X260 (WIRE) ×1 IMPLANT
INQWIRE 1.5J .035X260CM (WIRE) ×2
KIT ENCORE 26 ADVANTAGE (KITS) ×2 IMPLANT
KIT HEART LEFT (KITS) ×2 IMPLANT
PACK CARDIAC CATHETERIZATION (CUSTOM PROCEDURE TRAY) ×2 IMPLANT
SLED PULL BACK IVUS (MISCELLANEOUS) ×2 IMPLANT
STENT SYNERGY DES 3X16 (Permanent Stent) ×2 IMPLANT
SYR MEDRAD MARK V 150ML (SYRINGE) ×2 IMPLANT
TRANSDUCER W/STOPCOCK (MISCELLANEOUS) ×2 IMPLANT
TUBING CIL FLEX 10 FLL-RA (TUBING) ×2 IMPLANT
WIRE COUGAR XT STRL 190CM (WIRE) ×4 IMPLANT

## 2017-09-27 NOTE — Progress Notes (Signed)
Cardiology Admission History and Physical:   Patient ID: [name] [name]; MRN: [id number]; DOB: 1945-12-03   Admission date: 09/27/2017  Primary Care Provider: Sinda Du, MD Primary Cardiologist: Branch  Chief Complaint:  Chest pain  Patient Profile:   [name] [name] is a 72 y.o. male with a history of HTN, COPD, possible prior endocarditis who presented to [facility] this am with c/o chest pain.   History of Present Illness:   [name] [name] is a 72 yo male with a history of HTN, COPD, possible prior endocarditis who presented to [facility] this am with c/o chest pain. The pain occurred yesterday while exercising but resolved. He woke up this am with chest pain. EKG at [facility] with anterior ST elevation c/w an anterior STEMI. Chest CTA at [facility] with no evidence of aortic dissection. Pt was given ASA and heparin and transferred to [facility] for emergent cardiac catheterization. Upon arrival to [facility], he c/o ongoing chest pain.     Past Medical History:  Diagnosis Date  . COPD (chronic obstructive pulmonary disease) ([place])   . Hypertension   . Low serum testosterone level   . Pneumonia 05/2014  . Shortness of breath dyspnea   . Urinary frequency     Past Surgical History:  Procedure Laterality Date  . APPENDECTOMY    . COLONOSCOPY  2011   Tubular adenoma per patient  . COLONOSCOPY N/A 03/03/2015   Procedure: COLONOSCOPY;  Surgeon: Danie Binder, MD;  [facility];  Service: Endoscopy;  Laterality: N/A;  1245 - moved to 1:00 - office to notify pt   . EMPYEMA DRAINAGE Right 06/27/2014   Procedure: EMPYEMA DRAINAGE;  Surgeon: Grace Isaac, MD;  Location: [place];  Service: Thoracic;  Laterality: Right;  . HEMORRHOID BANDING N/A 03/03/2015   Procedure: HEMORRHOID BANDING;  Surgeon: Danie Binder, MD;  Location: [facility];  Service: Endoscopy;  Laterality: N/A;  Recomended to see a Psychologist, sport and exercise.  [name]    . TEE WITHOUT CARDIOVERSION N/A 07/14/2014     Procedure: TRANSESOPHAGEAL ECHOCARDIOGRAM (TEE);  Surgeon: Sueanne Margarita, MD;  Location: [facility] ENDOSCOPY;  Service: Cardiovascular;  Laterality: N/A;  . VARICOSE VEIN SURGERY    . VIDEO ASSISTED THORACOSCOPY Right 06/27/2014   Procedure: VIDEO ASSISTED THORACOSCOPY;  Surgeon: Grace Isaac, MD;  Location: [place];  Service: Thoracic;  Laterality: Right;  Marland Kitchen VIDEO BRONCHOSCOPY N/A 06/27/2014   Procedure: VIDEO BRONCHOSCOPY;  Surgeon: Grace Isaac, MD;  Location: [facility] OR;  Service: Thoracic;  Laterality: N/A;     Medications Prior to Admission: Prior to Admission medications   Medication Sig Start Date End Date Taking? Authorizing Provider  ALPRAZolam Duanne Moron) 0.25 MG tablet Take 0.25 mg by mouth at bedtime as needed for sleep. 07/11/17   [provider]  amLODipine (NORVASC) 5 MG tablet Take 5 mg by mouth daily.  05/20/14   [provider]  aspirin EC 81 MG tablet Take 81 mg by mouth daily.    [provider]  Calcium Citrate-Vitamin D (CALCIUM CITRATE + D3 PO) Take 1 tablet by mouth daily.    [provider]  HYDROcodone-acetaminophen (NORCO) 10-325 MG tablet Take 1 tablet by mouth every 4 (four) hours as needed for severe pain ((score 7 to 10)). 08/03/17   Eustace Moore, MD  Krill Oil 350 MG CAPS Take 350 mg by mouth daily.    [provider]  Lactobacillus (ACIDOPHILUS PROBIOTIC) 10 MG TABS Take 10 mg  by mouth daily.     [provider]  methocarbamol (ROBAXIN) 500 MG tablet Take 1 tablet (500 mg total) by mouth every 6 (six) hours as needed for muscle spasms. 08/03/17   Eustace Moore, MD  Multiple Vitamins-Minerals (MULTIVITAMINS THER. W/MINERALS) TABS tablet Take 1 tablet by mouth daily.    [provider]  NON FORMULARY Apply 1 application topically daily. Fluc2% Toe Nail Drops for Fungus    [provider]  PHYTOSTEROLS PO Take 1 tablet by mouth 2 (two) times daily.     [provider]  polyethylene  glycol-electrolytes (NULYTELY/GOLYTELY) 420 G solution Take 4,000 mLs by mouth once. Patient not taking: Reported on 07/18/2017 02/05/15   Carlis Stable, NP  rosuvastatin (CRESTOR) 5 MG tablet Take 5 mg by mouth daily.    [provider]  tamsulosin (FLOMAX) 0.4 MG CAPS capsule Take 0.4 mg by mouth daily.  05/30/14   [provider]  testosterone (ANDROGEL) 50 MG/5GM (1%) GEL Place 5 g onto the skin daily. 05/21/14   [provider]     Allergies:   No Known Allergies  Social History:   Social History   Socioeconomic History  . Marital status: Married    Spouse name: Not on file  . Number of children: Not on file  . Years of education: Not on file  . Highest education level: Not on file  Occupational History  . Not on file  Social Needs  . Financial resource strain: Not on file  . Food insecurity:    Worry: Not on file    Inability: Not on file  . Transportation needs:    Medical: Not on file    Non-medical: Not on file  Tobacco Use  . Smoking status: Former Smoker    Types: Pipe    Last attempt to quit: 06/07/2014    Years since quitting: 3.3  . Smokeless tobacco: Never Used  Substance and Sexual Activity  . Alcohol use: Yes    Alcohol/week: 0.0 oz    Comment: Average 1-2 drinks of beer in the evening  . Drug use: No  . Sexual activity: Not on file  Lifestyle  . Physical activity:    Days per week: Not on file    Minutes per session: Not on file  . Stress: Not on file  Relationships  . Social connections:    Talks on phone: Not on file    Gets together: Not on file    Attends religious service: Not on file    Active member of club or organization: Not on file    Attends meetings of clubs or organizations: Not on file    Relationship status: Not on file  . Intimate partner violence:    Fear of current or ex partner: Not on file    Emotionally abused: Not on file    Physically abused: Not on file    Forced sexual activity: Not on file    Other Topics Concern  . Not on file  Social History Narrative  . Not on file    Family History:   The patient's family history includes COPD in his mother; Hypertension in his father and mother; Tuberculosis in his father. There is no history of Colon cancer.    ROS:  Please see the history of present illness.  All other ROS reviewed and negative.     Physical Exam/Data:   Vitals:   09/27/17 4008 09/27/17 0714 09/27/17 0715 09/27/17 0730  BP:  (!) 155/90  130/85  Pulse:  (!) 59 65 74  Resp:  20 (!) 27 18  SpO2:  100% 100% 100%  Weight: 193 lb (87.5 kg)     Height: 6' (1.829 m)      No intake or output data in the 24 hours ending 09/27/17 0811 Filed Weights   09/27/17 0712  Weight: 193 lb (87.5 kg)   Body mass index is 26.18 kg/m.  General:  Well nourished, well developed, appears uncomfortable HEENT: normal Lymph: no adenopathy Neck: no JVD Endocrine:  No thryomegaly Vascular: No carotid bruits; FA pulses 2+ bilaterally without bruits  Cardiac:  normal S1, S2; RRR; no murmur  Lungs:  clear to auscultation bilaterally, no wheezing, rhonchi or rales  Abd: soft, nontender, no hepatomegaly  Ext: no LE edema Musculoskeletal:  No deformities, BUE and BLE strength normal and equal Skin: warm and dry  Neuro:  CNs 2-12 intact, no focal abnormalities noted Psych:  Normal affect    EKG:  The ECG that was done was personally reviewed and demonstrates sinus rhythm with greater than 2 mm ST elevation in the precordial and lateral leads.   Relevant CV Studies:   Laboratory Data:  Chemistry Recent Labs  Lab 09/27/17 0717 09/27/17 0732  NA 140  --   K 3.7  --   CL 102  --   GLUCOSE 183*  --   BUN 18  --   CREATININE 1.40* 1.50*    No results for input(s): PROT, ALBUMIN, AST, ALT, ALKPHOS, BILITOT in the last 168 hours. Hematology Recent Labs  Lab 09/27/17 0717 09/27/17 0718  WBC  --  13.8*  RBC  --  4.86  HGB 16.0 15.4  HCT 47.0 45.8  MCV  --  94.2  MCH   --  31.7  MCHC  --  33.6  RDW  --  13.7  PLT  --  262   Cardiac EnzymesNo results for input(s): TROPONINI in the last 168 hours.  Recent Labs  Lab 09/27/17 0728  TROPIPOC 0.33*    BNPNo results for input(s): BNP, PROBNP in the last 168 hours.  DDimer No results for input(s): DDIMER in the last 168 hours.  Radiology/Studies:  Ct Angio Chest/abd/pel For Dissection W And/or Wo Contrast  Result Date: 09/27/2017 CLINICAL DATA:  72 year old male with history of onset left-sided chest pain EXAM: CT ANGIOGRAPHY CHEST, ABDOMEN AND PELVIS TECHNIQUE: Multidetector CT imaging through the chest, abdomen and pelvis was performed using the standard protocol during bolus administration of intravenous contrast. Multiplanar reconstructed images and MIPs were obtained and reviewed to evaluate the vascular anatomy. CONTRAST:  156mL ISOVUE-370 IOPAMIDOL (ISOVUE-370) INJECTION 76% COMPARISON:  09/19/2017 06/15/2017, 10/28/2015, 09/08/2014 FINDINGS: CTA CHEST FINDINGS Cardiovascular: Heart: No cardiomegaly. No pericardial fluid/thickening. Calcifications of left anterior descending, circumflex, right coronary arteries. Aorta: Unremarkable course, caliber, contour of the thoracic aorta. No aneurysm or dissection flap. No periaortic fluid. No intramural hematoma. Branch vessels are patent. Cervical arteries at the base of the neck are patent. Bilateral subclavian arteries patent. Mild atherosclerosis of the descending thoracic aorta. Pulmonary arteries: Bolus timing is not optimized for evaluation of the pulmonary arteries. Redemonstration of enlarged main pulmonary artery, which is unchanged dating to the most remote CT comparison studies. The distal pulmonary arteries towards the periphery of the lungs are not significantly enlarged. Mediastinum/Nodes: No mediastinal adenopathy. Unremarkable appearance of the thoracic esophagus. Unremarkable appearance of the thoracic inlet. Lungs/Pleura: Central airways are clear. No  pleural effusion. No confluent  airspace disease. No pneumothorax. Similar appearance of scarring/atelectasis at the dependent right lung greater than left lung. Centrilobular emphysema. Musculoskeletal: No acute displaced fracture. Degenerative changes of the spine. Accentuated kyphotic curvature. No bony canal narrowing. Review of the MIP images confirms the above findings. . CTA ABDOMEN AND PELVIS FINDINGS VASCULAR Aorta: Mild atherosclerotic changes of the abdominal aorta. No dissection. No periaortic fluid. No intramural hematoma or penetrating ulcer. No aneurysm. Celiac: Celiac artery patent, with patent branch vessels. SMA: SMA patent with patent branch vessels. Renals: Renal arteries are patent. Single left and single right renal artery. IMA: IMA patent Right lower extremity: Mild atherosclerotic changes of the right iliac system without stenosis or occlusion. Hypogastric artery is patent. No aneurysm or dissection. Common femoral artery patent. Proximal SFA and profunda femoris patent Left lower extremity: Mild atherosclerotic changes of the left iliac system. No high-grade stenosis or occlusion. No aneurysm or dissection. Hypogastric artery is patent. Common femoral artery patent. Proximal SFA and profunda femoris patent Veins: Unremarkable appearance of the venous system. Review of the MIP images confirms the above findings. NON-VASCULAR Hepatobiliary: Unremarkable appearance of the liver. Unremarkable gall bladder. Pancreas: Unremarkable pancreas Spleen: Unremarkable spleen Adrenals/Urinary Tract: Unremarkable adrenal glands Right: No hydronephrosis. Symmetric perfusion to the left. No nephrolithiasis. Unremarkable course of the right ureter. Left: No hydronephrosis. Symmetric perfusion to the right. No nephrolithiasis. Unremarkable course of the left ureter. Unremarkable appearance of the urinary bladder . Stomach/Bowel: Unremarkable appearance of the stomach. Unremarkable appearance of small bowel. No  evidence of obstruction. Colonic diverticula. No inflammatory changes. Appendix is not visualized, however, no inflammatory changes are present adjacent to the cecum to indicate an appendicitis. Lymphatic: No adenopathy of the abdomen. Small confluent nodules of the right inguinal region soft tissues, associated with the lymph nodes and small veins. Mesenteric: No free fluid or air. No adenopathy. Reproductive: Unremarkable appearance of the pelvic organs. Other: Surgical changes along the midline abdomen. Small fat containing umbilical hernia Musculoskeletal: No acute displaced fracture. Surgical changes of prior L4-L5 fixation. Degenerative changes of the lumbar spine. Degenerative changes of the bilateral hips. No aggressive bony lesions. IMPRESSION: No CT evidence of acute aortic syndrome. No acute CT finding of the chest abdomen pelvis. Three vessel coronary artery disease and associated aortic atherosclerosis. Aortic Atherosclerosis (ICD10-I70.0). Emphysema (ICD10-J43.9). Scarring/chronic changes at the lung bases. Diverticular disease without evidence of acute diverticulitis. Confluent small nodules in the right inguinal region may represent venous varicosities or small lymph nodes, and should be amenable to direct inspection. Signed, Dulcy Fanny. Dellia Nims, RPVI [facility] Radiology Electronically Signed   By: Corrie Mckusick D.O.   On: 09/27/2017 07:54    Assessment and Plan:   1. Acute anterior STEMI: Plan emergent cardiac cath with probable PCI. Further plans to follow after cath.   Severity of Illness: The appropriate patient status for this patient is INPATIENT. Inpatient status is judged to be reasonable and necessary in order to provide the required intensity of service to ensure the patient's safety. The patient's presenting symptoms, physical exam findings, and initial radiographic and laboratory data in the context of their chronic comorbidities  is felt to place them at high risk for further clinical deterioration. Furthermore, it is not anticipated that the patient will be medically stable for discharge from the hospital within 2 midnights of admission. The following factors support the patient status of inpatient.   " The patient's presenting symptoms include anterior STEMI. " The worrisome physical exam findings include  ongoing chest pain, EKG changes " The initial radiographic and laboratory data are worrisome because of acute MI, elevated troponin " The chronic co-morbidities include HTN   * I certify that at the point of admission it is my clinical judgment that the patient will require inpatient hospital care spanning beyond 2 midnights from the point of admission due to high intensity of service, high risk for further deterioration and high frequency of surveillance required.*    For questions or updates, please contact [place] Please consult [email or web address] for contact info under Cardiology/STEMI.    Signed, Lauree Chandler, MD  09/27/2017 8:11 AM

## 2017-09-27 NOTE — ED Notes (Signed)
EKG given to Dr. Kohut 

## 2017-09-27 NOTE — ED Notes (Signed)
Family at bedside. 

## 2017-09-27 NOTE — Progress Notes (Signed)
  Echocardiogram 2D Echocardiogram has been performed.  Madelaine Etienne 09/27/2017, 2:22 PM

## 2017-09-27 NOTE — ED Triage Notes (Signed)
Patient complains of left chest pain that radiates to left shoulder that started this morning. He describes the pain as constant that comes and goes. States nausea; denies vomiting and diarrhea.

## 2017-09-27 NOTE — ED Notes (Signed)
Critical Result  09/27/2017 0740   Troponin   Result: 0.33   MD notified: Wilson Singer

## 2017-09-27 NOTE — ED Provider Notes (Signed)
[facility] EMERGENCY DEPARTMENT Provider Note   CSN: [id number] Arrival date & time: 09/27/17  0705     History   Chief Complaint Chief Complaint  Patient presents with  . Chest Pain    HPI [name] [name] is a 72 y.o. male.  HPI   72 year old male with left shoulder pain with radiation into his left upper arm and left scapular region.  He actually had some mild discomfort when walking on the treadmill yesterday.  He initially did not think much of it and it did resolved when he stopped.  He felt relatively fine throughout the evening.  Earlier this morning shortly before arrival he began having pain again the same area.  Since coming to the emergency room it has begun radiating into his anterior chest.  Associated with nausea.  He is diaphoretic and looks ill/uncomfortable.  He denies any history of known CAD.  He reports that he has seen Dr. Harl Bowie previously but from his description it sounds like this may have been related to endocarditis?   Past Medical History:  Diagnosis Date  . COPD (chronic obstructive pulmonary disease) ([place])   . Hypertension   . Low serum testosterone level   . Pneumonia 05/2014  . Shortness of breath dyspnea   . Urinary frequency     Patient Active Problem List   Diagnosis Date Noted  . S/P lumbar spinal fusion 08/02/2017  . Hemorrhoids 02/05/2015  . History of adenomatous polyp of colon 02/05/2015  . Acute and subacute infective endocarditis in diseases classified elsewhere 07/22/2014  . Empyema ([place])   . Pyrexia   . Fever 07/09/2014  . Empyema of right pleural space ([place]) 06/26/2014  . Empyema lung ([place]) 06/26/2014  . SIRS (systemic inflammatory response syndrome) ([place]) 06/10/2014  . CAP (community acquired pneumonia) 06/07/2014  . Acute respiratory failure with hypoxia ([place]) 06/07/2014  . Acute renal injury ([place]) 06/07/2014  . Dehydration 06/07/2014  . Hypertension 06/07/2014  . BPH (benign prostatic hypertrophy) 06/07/2014  . COPD  (chronic obstructive pulmonary disease) ([place]) 06/07/2014    Past Surgical History:  Procedure Laterality Date  . APPENDECTOMY    . COLONOSCOPY  2011   Tubular adenoma per patient  . COLONOSCOPY N/A 03/03/2015   Procedure: COLONOSCOPY;  Surgeon: Danie Binder, MD;  [facility];  Service: Endoscopy;  Laterality: N/A;  1245 - moved to 1:00 - office to notify pt   . EMPYEMA DRAINAGE Right 06/27/2014   Procedure: EMPYEMA DRAINAGE;  Surgeon: Grace Isaac, MD;  Location: [place];  Service: Thoracic;  Laterality: Right;  . HEMORRHOID BANDING N/A 03/03/2015   Procedure: HEMORRHOID BANDING;  Surgeon: Danie Binder, MD;  Location[facility];  Service: Endoscopy;  Laterality: N/A;  Recomended to see a Psychologist, sport and exercise.  [name]    . TEE WITHOUT CARDIOVERSION N/A 07/14/2014   Procedure: TRANSESOPHAGEAL ECHOCARDIOGRAM (TEE);  Surgeon: Sueanne Margarita, MD;  Location: [facility] ENDOSCOPY;  Service: Cardiovascular;  Laterality: N/A;  . VARICOSE VEIN SURGERY    . VIDEO ASSISTED THORACOSCOPY Right 06/27/2014   Procedure: VIDEO ASSISTED THORACOSCOPY;  Surgeon: Grace Isaac, MD;  Location: [place];  Service: Thoracic;  Laterality: Right;  Marland Kitchen VIDEO BRONCHOSCOPY N/A 06/27/2014   Procedure: VIDEO BRONCHOSCOPY;  Surgeon: Grace Isaac, MD;  Location: [facility] OR;  Service: Thoracic;  Laterality: N/A;        Home Medications    Prior to Admission medications   Medication Sig Start Date End Date Taking? Authorizing  Provider  ALPRAZolam Duanne Moron) 0.25 MG tablet Take 0.25 mg by mouth at bedtime as needed for sleep. 07/11/17   [provider]  amLODipine (NORVASC) 5 MG tablet Take 5 mg by mouth daily.  05/20/14   [provider]  aspirin EC 81 MG tablet Take 81 mg by mouth daily.    [provider]  Calcium Citrate-Vitamin D (CALCIUM CITRATE + D3 PO) Take 1 tablet by mouth daily.    [provider]  HYDROcodone-acetaminophen (NORCO) 10-325 MG tablet Take 1 tablet by mouth  every 4 (four) hours as needed for severe pain ((score 7 to 10)). 08/03/17   Eustace Moore, MD  Krill Oil 350 MG CAPS Take 350 mg by mouth daily.    [provider]  Lactobacillus (ACIDOPHILUS PROBIOTIC) 10 MG TABS Take 10 mg by mouth daily.     [provider]  methocarbamol (ROBAXIN) 500 MG tablet Take 1 tablet (500 mg total) by mouth every 6 (six) hours as needed for muscle spasms. 08/03/17   Eustace Moore, MD  Multiple Vitamins-Minerals (MULTIVITAMINS THER. W/MINERALS) TABS tablet Take 1 tablet by mouth daily.    [provider]  NON FORMULARY Apply 1 application topically daily. Fluc2% Toe Nail Drops for Fungus    [provider]  PHYTOSTEROLS PO Take 1 tablet by mouth 2 (two) times daily.     [provider]  polyethylene glycol-electrolytes (NULYTELY/GOLYTELY) 420 G solution Take 4,000 mLs by mouth once. Patient not taking: Reported on 07/18/2017 02/05/15   Carlis Stable, NP  rosuvastatin (CRESTOR) 5 MG tablet Take 5 mg by mouth daily.    [provider]  tamsulosin (FLOMAX) 0.4 MG CAPS capsule Take 0.4 mg by mouth daily.  05/30/14   [provider]  testosterone (ANDROGEL) 50 MG/5GM (1%) GEL Place 5 g onto the skin daily. 05/21/14   [provider]    Family History Family History  Problem Relation Age of Onset  . Tuberculosis Father   . Hypertension Father   . COPD Mother   . Hypertension Mother   . Colon cancer Neg Hx     Social History Social History   Tobacco Use  . Smoking status: Former Smoker    Types: Pipe    Last attempt to quit: 06/07/2014    Years since quitting: 3.3  . Smokeless tobacco: Never Used  Substance Use Topics  . Alcohol use: Yes    Alcohol/week: 0.0 oz    Comment: Average 1-2 drinks of beer in the evening  . Drug use: No     Allergies   Patient has no known allergies.   Review of Systems Review of Systems All systems reviewed and negative, other than as noted in  HPI.   Physical Exam Updated Vital Signs BP (!) 155/90 (BP Location: Right Arm)   Pulse 65   Resp (!) 27   Ht 6' (1.829 m)   Wt 87.5 kg (193 lb)   SpO2 100%   BMI 26.18 kg/m   Physical Exam  Constitutional: He appears well-developed and well-nourished. He appears distressed.  Appears ill. Pale. Diaphoretic.   HENT:  Head: Normocephalic and atraumatic.  Eyes: Conjunctivae are normal. Right eye exhibits no discharge. Left eye exhibits no discharge.  Neck: Neck supple.  Cardiovascular: Normal rate, regular rhythm and normal heart sounds. Exam reveals no gallop and no friction rub.  No murmur heard. Pulmonary/Chest: Effort normal and breath sounds normal.  Tachypnea but breath sounds clear  Abdominal:  Soft. He exhibits no distension. There is tenderness.  Epigastric tenderness. Bedside US with aorta ~2cm just above iliac bifurcation and no significant dilation the best I could track it more superiorly. Symmetric/strong femoral pulses.   Musculoskeletal: He exhibits no edema or tenderness.  Neurological: He is alert.  Skin: He is diaphoretic. There is pallor.  Lower chest to mid abdomen mottled appearance  Psychiatric: He has a normal mood and affect. His behavior is normal. Thought content normal.  Nursing note and vitals reviewed.    ED Treatments / Results  Labs (all labs ordered are listed, but only abnormal results are displayed) Labs Reviewed  CBC WITH DIFFERENTIAL/PLATELET - Abnormal; Notable for the following components:      Result Value   WBC 13.8 (*)    Neutro Abs 8.3 (*)    Lymphs Abs 4.1 (*)    Monocytes Absolute 1.2 (*)    All other components within normal limits  I-STAT TROPONIN, ED - Abnormal; Notable for the following components:   Troponin i, poc 0.33 (*)    All other components within normal limits  I-STAT CHEM 8, ED - Abnormal; Notable for the following components:   Creatinine, Ser 1.40 (*)    Glucose, Bld 183 (*)    Calcium, Ion 1.06 (*)    All  other components within normal limits  I-STAT CREATININE, ED - Abnormal; Notable for the following components:   Creatinine, Ser 1.50 (*)    All other components within normal limits  TROPONIN I    EKG EKG Interpretation  Date/Time:  Wednesday September 27 2017 07:12:43 EDT Ventricular Rate:  65 PR Interval:    QRS Duration: 86 QT Interval:  399 QTC Calculation: 415 R Axis:   -16 Text Interpretation:  Sinus rhythm Atrial premature complex Anterolateral infarct, acute (LAD) Confirmed by Virgel Manifold [phone]) on 09/27/2017 7:50:14 AM   Radiology Ct Angio Chest/abd/pel For Dissection W And/or Wo Contrast  Result Date: 09/27/2017 CLINICAL DATA:  72 year old male with history of onset left-sided chest pain EXAM: CT ANGIOGRAPHY CHEST, ABDOMEN AND PELVIS TECHNIQUE: Multidetector CT imaging through the chest, abdomen and pelvis was performed using the standard protocol during bolus administration of intravenous contrast. Multiplanar reconstructed images and MIPs were obtained and reviewed to evaluate the vascular anatomy. CONTRAST:  136mL ISOVUE-370 IOPAMIDOL (ISOVUE-370) INJECTION 76% COMPARISON:  09/19/2017 06/15/2017, 10/28/2015, 09/08/2014 FINDINGS: CTA CHEST FINDINGS Cardiovascular: Heart: No cardiomegaly. No pericardial fluid/thickening. Calcifications of left anterior descending, circumflex, right coronary arteries. Aorta: Unremarkable course, caliber, contour of the thoracic aorta. No aneurysm or dissection flap. No periaortic fluid. No intramural hematoma. Branch vessels are patent. Cervical arteries at the base of the neck are patent. Bilateral subclavian arteries patent. Mild atherosclerosis of the descending thoracic aorta. Pulmonary arteries: Bolus timing is not optimized for evaluation of the pulmonary arteries. Redemonstration of enlarged main pulmonary artery, which is unchanged dating to the most remote CT comparison studies. The distal pulmonary arteries towards the periphery of the lungs  are not significantly enlarged. Mediastinum/Nodes: No mediastinal adenopathy. Unremarkable appearance of the thoracic esophagus. Unremarkable appearance of the thoracic inlet. Lungs/Pleura: Central airways are clear. No pleural effusion. No confluent airspace disease. No pneumothorax. Similar appearance of scarring/atelectasis at the dependent right lung greater than left lung. Centrilobular emphysema. Musculoskeletal: No acute displaced fracture. Degenerative changes of the spine. Accentuated kyphotic curvature. No bony canal narrowing. Review of the MIP images confirms the above findings. . CTA ABDOMEN AND PELVIS FINDINGS VASCULAR Aorta: Mild atherosclerotic changes of the abdominal  aorta. No dissection. No periaortic fluid. No intramural hematoma or penetrating ulcer. No aneurysm. Celiac: Celiac artery patent, with patent branch vessels. SMA: SMA patent with patent branch vessels. Renals: Renal arteries are patent. Single left and single right renal artery. IMA: IMA patent Right lower extremity: Mild atherosclerotic changes of the right iliac system without stenosis or occlusion. Hypogastric artery is patent. No aneurysm or dissection. Common femoral artery patent. Proximal SFA and profunda femoris patent Left lower extremity: Mild atherosclerotic changes of the left iliac system. No high-grade stenosis or occlusion. No aneurysm or dissection. Hypogastric artery is patent. Common femoral artery patent. Proximal SFA and profunda femoris patent Veins: Unremarkable appearance of the venous system. Review of the MIP images confirms the above findings. NON-VASCULAR Hepatobiliary: Unremarkable appearance of the liver. Unremarkable gall bladder. Pancreas: Unremarkable pancreas Spleen: Unremarkable spleen Adrenals/Urinary Tract: Unremarkable adrenal glands Right: No hydronephrosis. Symmetric perfusion to the left. No nephrolithiasis. Unremarkable course of the right ureter. Left: No hydronephrosis. Symmetric perfusion to  the right. No nephrolithiasis. Unremarkable course of the left ureter. Unremarkable appearance of the urinary bladder . Stomach/Bowel: Unremarkable appearance of the stomach. Unremarkable appearance of small bowel. No evidence of obstruction. Colonic diverticula. No inflammatory changes. Appendix is not visualized, however, no inflammatory changes are present adjacent to the cecum to indicate an appendicitis. Lymphatic: No adenopathy of the abdomen. Small confluent nodules of the right inguinal region soft tissues, associated with the lymph nodes and small veins. Mesenteric: No free fluid or air. No adenopathy. Reproductive: Unremarkable appearance of the pelvic organs. Other: Surgical changes along the midline abdomen. Small fat containing umbilical hernia Musculoskeletal: No acute displaced fracture. Surgical changes of prior L4-L5 fixation. Degenerative changes of the lumbar spine. Degenerative changes of the bilateral hips. No aggressive bony lesions. IMPRESSION: No CT evidence of acute aortic syndrome. No acute CT finding of the chest abdomen pelvis. Three vessel coronary artery disease and associated aortic atherosclerosis. Aortic Atherosclerosis (ICD10-I70.0). Emphysema (ICD10-J43.9). Scarring/chronic changes at the lung bases. Diverticular disease without evidence of acute diverticulitis. Confluent small nodules in the right inguinal region may represent venous varicosities or small lymph nodes, and should be amenable to direct inspection. Signed, Dulcy Fanny. Dellia Nims, RPVI [facility] Radiology Electronically Signed   By: Corrie Mckusick D.O.   On: 09/27/2017 07:54    Procedures Procedures (including critical care time)  CRITICAL CARE Performed by: Virgel Manifold Total critical care time: 40 minutes Critical care time was exclusive of separately billable procedures and treating other patients. Critical care was necessary to treat or prevent imminent or  life-threatening deterioration. Critical care was time spent personally by me on the following activities: development of treatment plan with patient and/or surrogate as well as nursing, discussions with consultants, evaluation of patient's response to treatment, examination of patient, obtaining history from patient or surrogate, ordering and performing treatments and interventions, ordering and review of laboratory studies, ordering and review of radiographic studies, pulse oximetry and re-evaluation of patient's condition.  Medications Ordered in ED Medications  aspirin EC 81 MG tablet (has no administration in time range)  heparin 100-0.45 UNIT/ML-% infusion (has no administration in time range)  fentaNYL (SUBLIMAZE) injection 50 mcg (50 mcg Intravenous Given 09/27/17 0721)     Initial Impression / Assessment and Plan / ED Course  I have reviewed the triage vital signs and the nursing notes.  Pertinent labs & imaging results that were available during my care of the patient were reviewed by me and considered  in my medical decision making (see chart for details).  Clinical Course as of Sep 28 747  Wed Sep 27, 2017  0720 Likely STEMI. Some concern for possible dissection. Pain in back/shoulder primarily. Chest and abdomen mottled. Abdominal aorta is ~2 cm on my bedside US though. Strong/symmetric femoral pulses. Will obtain emergent CT chest to evaluate for dissection. ASA and heparin held. Will be going to [facility] regardless.    [SK]  [id number] Discussed with Dr Angelena Form. Will be going to [facility]. Local EMS called and to be going emergency traffic regardless if STEMI or dissection.    [SK]  9163 No dissection per my read. [facility] just arrived. ASA/heparin/nitro. Pt/wife updated on diagnosis and plan.    [SK]    Clinical Course User Index [SK] Virgel Manifold, MD    72yM with back pain/L shoulder pain/chest pain. EKG with STE V2-5, I and aVL. Perhaps some mild ST depression inferiorly. No  dissection. Transfer emergently to Cath Lab at [facility].   Final Clinical Impressions(s) / ED Diagnoses   Final diagnoses:  ST elevation myocardial infarction (STEMI), unspecified artery [facility])    ED Discharge Orders    None       Virgel Manifold, MD 09/27/17 [phone]

## 2017-09-28 ENCOUNTER — Inpatient Hospital Stay (HOSPITAL_COMMUNITY): Payer: Medicare Other

## 2017-09-28 LAB — BASIC METABOLIC PANEL
ANION GAP: 11 (ref 5–15)
BUN: 16 mg/dL (ref 8–23)
CALCIUM: 8.7 mg/dL — AB (ref 8.9–10.3)
CO2: 23 mmol/L (ref 22–32)
Chloride: 103 mmol/L (ref 98–111)
Creatinine, Ser: 1.36 mg/dL — ABNORMAL HIGH (ref 0.61–1.24)
GFR calc Af Amer: 58 mL/min — ABNORMAL LOW (ref >60)
GFR calc non Af Amer: 50 mL/min — ABNORMAL LOW (ref >60)
GLUCOSE: 140 mg/dL — AB (ref 70–99)
Potassium: 4 mmol/L (ref 3.5–5.1)
SODIUM: 137 mmol/L (ref 135–145)

## 2017-09-28 LAB — CBC
HCT: 42.2 % (ref 39.0–52.0)
HEMOGLOBIN: 13.8 g/dL (ref 13.0–17.0)
MCH: 30.8 pg (ref 26.0–34.0)
MCHC: 32.7 g/dL (ref 30.0–36.0)
MCV: 94.2 fL (ref 78.0–100.0)
Platelets: 201 10*3/uL (ref 150–400)
RBC: 4.48 MIL/uL (ref 4.22–5.81)
RDW: 13.7 % (ref 11.5–15.5)
WBC: 18.9 10*3/uL — ABNORMAL HIGH (ref 4.0–10.5)

## 2017-09-28 MED ORDER — LOSARTAN POTASSIUM 25 MG PO TABS
25.0000 mg | ORAL_TABLET | Freq: Every day | ORAL | Status: DC
  Administered 2017-09-28 – 2017-10-04 (×7): 25 mg via ORAL
  Filled 2017-09-28 (×7): qty 1

## 2017-09-28 MED ORDER — CARVEDILOL 3.125 MG PO TABS
3.1250 mg | ORAL_TABLET | Freq: Two times a day (BID) | ORAL | Status: DC
  Administered 2017-09-28 – 2017-09-30 (×5): 3.125 mg via ORAL
  Filled 2017-09-28 (×5): qty 1

## 2017-09-28 MED FILL — Verapamil HCl IV Soln 2.5 MG/ML: INTRAVENOUS | Qty: 2 | Status: AC

## 2017-09-28 MED FILL — Heparin Sod (Porcine)-NaCl IV Soln 1000 Unit/500ML-0.9%: INTRAVENOUS | Qty: 1000 | Status: AC

## 2017-09-28 MED FILL — Lidocaine HCl Local Preservative Free (PF) Inj 1%: INTRAMUSCULAR | Qty: 30 | Status: AC

## 2017-09-28 NOTE — Plan of Care (Signed)
  Problem: Education: Goal: Understanding of cardiac disease, CV risk reduction, and recovery process will improve Outcome: Progressing Note:  Education ongoing Goal: Understanding of medication regimen will improve Outcome: Progressing   Problem: Activity: Goal: Ability to tolerate increased activity will improve Outcome: Progressing Note:  Up OOB as tolerated    Problem: Cardiac: Goal: Ability to achieve and maintain adequate cardiopulmonary perfusion will improve Outcome: Progressing Note:  Within parameters Goal: Vascular access site(s) Level 0-1 will be maintained Outcome: Progressing Note:  Level 0

## 2017-09-28 NOTE — Progress Notes (Signed)
Progress Note  Patient Name: [name] [name] Date of Encounter: 09/28/2017  Primary Cardiologist: No primary care provider on file.   Subjective   He has had some "shallow breathing" overnight.  No chest pain reminiscent of his heart attack.  Otherwise no complaints.  Inpatient Medications    Scheduled Meds: . aspirin EC  81 mg Oral Daily  . metoprolol tartrate  25 mg Oral BID  . rosuvastatin  40 mg Oral Daily  . sodium chloride flush  3 mL Intravenous Q12H  . tamsulosin  0.4 mg Oral Daily  . ticagrelor  90 mg Oral BID   Continuous Infusions: . sodium chloride Stopped (09/28/17 0100)   PRN Meds: sodium chloride, acetaminophen, ALPRAZolam, ondansetron (ZOFRAN) IV, sodium chloride flush   Vital Signs    Vitals:   09/28/17 0408 09/28/17 0426 09/28/17 0449 09/28/17 0500  BP: 115/63   133/78  Pulse:   83 90  Resp:      Temp:  98.2 F (36.8 C)    TempSrc:  Oral    SpO2:   95% 97%  Weight:      Height:        Intake/Output Summary (Last 24 hours) at 09/28/2017 1194 Last data filed at 09/28/2017 0200 Gross per 24 hour  Intake 903.11 ml  Output 625 ml  Net 278.11 ml   Filed Weights   09/27/17 0712  Weight: 193 lb (87.5 kg)    Telemetry    Sinus rhythm, sinus tachycardia- Personally Reviewed  ECG    Normal sinus rhythm with anterolateral STEMI pattern, ST elevation improved from baseline.- Personally Reviewed  Physical Exam  Alert, oriented male in no distress GEN: No acute distress.   Neck: No JVD Cardiac: RRR, no murmurs, rubs, or gallops.  Respiratory: Clear to auscultation bilaterally. GI: Soft, nontender, non-distended  MS: No edema; No deformity.  Radial site clear Neuro:  Nonfocal  Psych: Normal affect   Labs    Chemistry Recent Labs  Lab 09/27/17 0717 09/27/17 0732 09/28/17 0248  NA 140  --  137  K 3.7  --  4.0  CL 102  --  103  CO2  --   --  23  GLUCOSE 183*  --  140*  BUN 18  --  16  CREATININE 1.40* 1.50* 1.36*  CALCIUM  --   --   8.7*  GFRNONAA  --   --  50*  GFRAA  --   --  58*  ANIONGAP  --   --  11     Hematology Recent Labs  Lab 09/27/17 0717 09/27/17 0718 09/28/17 0248  WBC  --  13.8* 18.9*  RBC  --  4.86 4.48  HGB 16.0 15.4 13.8  HCT 47.0 45.8 42.2  MCV  --  94.2 94.2  MCH  --  31.7 30.8  MCHC  --  33.6 32.7  RDW  --  13.7 13.7  PLT  --  262 201    Cardiac Enzymes Recent Labs  Lab 09/27/17 1032 09/27/17 1602 09/27/17 2227  TROPONINI >65.00* >65.00* >65.00*    Recent Labs  Lab 09/27/17 0728  TROPIPOC 0.33*     BNPNo results for input(s): BNP, PROBNP in the last 168 hours.   DDimer No results for input(s): DDIMER in the last 168 hours.   Radiology    Ct Angio Chest/abd/pel For Dissection W And/or Wo Contrast  Result Date: 09/27/2017 CLINICAL DATA:  72 year old male with history of onset left-sided chest pain EXAM: CT  ANGIOGRAPHY CHEST, ABDOMEN AND PELVIS TECHNIQUE: Multidetector CT imaging through the chest, abdomen and pelvis was performed using the standard protocol during bolus administration of intravenous contrast. Multiplanar reconstructed images and MIPs were obtained and reviewed to evaluate the vascular anatomy. CONTRAST:  149mL ISOVUE-370 IOPAMIDOL (ISOVUE-370) INJECTION 76% COMPARISON:  09/19/2017 06/15/2017, 10/28/2015, 09/08/2014 FINDINGS: CTA CHEST FINDINGS Cardiovascular: Heart: No cardiomegaly. No pericardial fluid/thickening. Calcifications of left anterior descending, circumflex, right coronary arteries. Aorta: Unremarkable course, caliber, contour of the thoracic aorta. No aneurysm or dissection flap. No periaortic fluid. No intramural hematoma. Branch vessels are patent. Cervical arteries at the base of the neck are patent. Bilateral subclavian arteries patent. Mild atherosclerosis of the descending thoracic aorta. Pulmonary arteries: Bolus timing is not optimized for evaluation of the pulmonary arteries. Redemonstration of enlarged main pulmonary artery, which is unchanged  dating to the most remote CT comparison studies. The distal pulmonary arteries towards the periphery of the lungs are not significantly enlarged. Mediastinum/Nodes: No mediastinal adenopathy. Unremarkable appearance of the thoracic esophagus. Unremarkable appearance of the thoracic inlet. Lungs/Pleura: Central airways are clear. No pleural effusion. No confluent airspace disease. No pneumothorax. Similar appearance of scarring/atelectasis at the dependent right lung greater than left lung. Centrilobular emphysema. Musculoskeletal: No acute displaced fracture. Degenerative changes of the spine. Accentuated kyphotic curvature. No bony canal narrowing. Review of the MIP images confirms the above findings. . CTA ABDOMEN AND PELVIS FINDINGS VASCULAR Aorta: Mild atherosclerotic changes of the abdominal aorta. No dissection. No periaortic fluid. No intramural hematoma or penetrating ulcer. No aneurysm. Celiac: Celiac artery patent, with patent branch vessels. SMA: SMA patent with patent branch vessels. Renals: Renal arteries are patent. Single left and single right renal artery. IMA: IMA patent Right lower extremity: Mild atherosclerotic changes of the right iliac system without stenosis or occlusion. Hypogastric artery is patent. No aneurysm or dissection. Common femoral artery patent. Proximal SFA and profunda femoris patent Left lower extremity: Mild atherosclerotic changes of the left iliac system. No high-grade stenosis or occlusion. No aneurysm or dissection. Hypogastric artery is patent. Common femoral artery patent. Proximal SFA and profunda femoris patent Veins: Unremarkable appearance of the venous system. Review of the MIP images confirms the above findings. NON-VASCULAR Hepatobiliary: Unremarkable appearance of the liver. Unremarkable gall bladder. Pancreas: Unremarkable pancreas Spleen: Unremarkable spleen Adrenals/Urinary Tract: Unremarkable adrenal glands Right: No hydronephrosis. Symmetric perfusion to the  left. No nephrolithiasis. Unremarkable course of the right ureter. Left: No hydronephrosis. Symmetric perfusion to the right. No nephrolithiasis. Unremarkable course of the left ureter. Unremarkable appearance of the urinary bladder . Stomach/Bowel: Unremarkable appearance of the stomach. Unremarkable appearance of small bowel. No evidence of obstruction. Colonic diverticula. No inflammatory changes. Appendix is not visualized, however, no inflammatory changes are present adjacent to the cecum to indicate an appendicitis. Lymphatic: No adenopathy of the abdomen. Small confluent nodules of the right inguinal region soft tissues, associated with the lymph nodes and small veins. Mesenteric: No free fluid or air. No adenopathy. Reproductive: Unremarkable appearance of the pelvic organs. Other: Surgical changes along the midline abdomen. Small fat containing umbilical hernia Musculoskeletal: No acute displaced fracture. Surgical changes of prior L4-L5 fixation. Degenerative changes of the lumbar spine. Degenerative changes of the bilateral hips. No aggressive bony lesions. IMPRESSION: No CT evidence of acute aortic syndrome. No acute CT finding of the chest abdomen pelvis. Three vessel coronary artery disease and associated aortic atherosclerosis. Aortic Atherosclerosis (ICD10-I70.0). Emphysema (ICD10-J43.9). Scarring/chronic changes at the lung bases. Diverticular disease without evidence of acute diverticulitis. Confluent small  nodules in the right inguinal region may represent venous varicosities or small lymph nodes, and should be amenable to direct inspection. Signed, Dulcy Fanny. Dellia Nims, RPVI [facility] Radiology Electronically Signed   By: Corrie Mckusick D.O.   On: 09/27/2017 07:54    Cardiac Studies   Echo: Study Conclusions  - Left ventricle: The cavity size was normal. There was mild focal   basal hypertrophy of the septum. Systolic function was  moderately   to severely reduced. The estimated ejection fraction was in the   range of 30% to 35%. Severe hypokinesis of the mid to apical   anteroseptal, anterior, and anterolateral myocardium. Dyskinetic   at apex. Doppler parameters are consistent with abnormal left   ventricular relaxation (grade 1 diastolic dysfunction). - Aortic valve: Trileaflet; mildly thickened, mildly calcified   leaflets. - Mitral valve: Calcified annulus. There was trivial regurgitation. - Pulmonary arteries: PA peak pressure: 41 mm Hg (S). - Pericardium, extracardiac: A small pericardial effusion was   identified.  Impressions:  - New reduction in LV EF with regional wall motion abnormalities in   the mid to apical anteroseptum, anterior, and anterolateral walls   with dyskinesis at the apex. Small pericardial effusion noted.  Cath: Conclusion     Prox RCA lesion is 30% stenosed.  Prox LAD lesion is 100% stenosed.  Prox Cx lesion is 20% stenosed.  A drug-eluting stent was successfully placed using a STENT SYNERGY DES 3X16.  Post intervention, there is a 0% residual stenosis.  Ost 1st Diag lesion is 100% stenosed.  Balloon angioplasty was performed using a BALLOON SAPPHIRE 2.5X12.  Post intervention, there is a 40% residual stenosis.  Prox LAD to Mid LAD lesion is 40% stenosed.   1. Acute anterior STEMI secondary to occlusion of the mid LAD 2. Successful PTCA/DES x 1 mid LAD 3. Successful PTCA/angioplasty ostium of the Diagonal branch.  4. Mild non-obstructive disease in the RCA, circumflex and mid LAD  Recommendations:  Will admit to the ICU and continue Aggrastat for 6 hours. Will start high intensity statin and a beta blocker. Echo later today.   . Recommend uninterrupted dual antiplatelet therapy with Aspirin 81mg  daily and Ticagrelor 90mg  twice daily for a minimum of 12 months (ACS - Class I recommendation).     Patient Profile     72 y.o. male presenting from [facility] 7/10 with anterior STEMI  Assessment & Plan    1. Anterior STEMI (STEMI involving the LAD): s/p Primary PCI with a drug-eluting stent. Mild nonobstructive disease in remaining coronary arteries. Pt treated with ASA and ticagrelor. Start high-intensity statin with atorvastatin 80 mg.   2. Acute systolic heart failure: secondary to #1. Large infarct based on enzymes (Trop >65 x3) and echo with LVEF <35%.   Change metoprolol to carvedilol  Add losartan and if remains stable change to entresto  likely add aldactone tomorrow if BP tolerates coreg and losartan  Lifevest consultation.  Discussed with patient at length.  He understands rationale for this.  3. Tobacco: History of, quit about 3 years ago.  For questions or updates, please contact [facility] Please consult [email or web address] for contact info under Cardiology/STEMI.   Signed, Sherren Mocha, MD  09/28/2017, 6:27 AM

## 2017-09-28 NOTE — Progress Notes (Signed)
Per insurance check for Brilinta  # 7. S/W EVA @ [facility] (PDP) # [phone]   BRILINTA  90 MG BID  COVER- YES  CO-PAY- $ 30.00  TIER- 2 DRUG  PRIOR APPROVAL- NO   PREFERRED PHARMACY : YES [facility]

## 2017-09-28 NOTE — Progress Notes (Signed)
EKG CRITICAL VALUE     12 lead EKG performed.  Critical value noted.  Lennette Bihari, RN notified.   Genia Plants, CCT 09/28/2017 7:51 AM

## 2017-09-28 NOTE — Progress Notes (Signed)
    Order form completed and Rep notified for Life vest.   Signed, Reino Bellis, NP-C 09/28/2017, 1:53 PM Pager: [phone]

## 2017-09-28 NOTE — Progress Notes (Signed)
CARDIAC REHAB PHASE I   PRE:  Rate/Rhythm: 103 ST    BP: sitting 123/69    SaO2: 95 1 1/2L  MODE:  Ambulation: 290 ft   POST:  Rate/Rhythm: 121 ST    BP: sitting 124/67     SaO2: 90 RA (? Accuracy), 95 1 1/2L  Pt with chest pressure and SOB, especially with activity. To BR then eager to walk. Slow pace, steady. Significant SOB after 200 ft, esp with talking. Had him rest 1 min. Pt minimizes his sx. Return to recliner. In depth education began with pt and family. Good reception. Discussed MI, Brilinta, stent, restrictions, diet, daily wts, and CRPII. Will send referral to [facility]. Pt normally runs on TM 45-90 min a day. Will continue to discuss education and activity/ex.  [id number]   [place], ACSM 09/28/2017 11:46 AM

## 2017-09-29 DIAGNOSIS — I4891 Unspecified atrial fibrillation: Secondary | ICD-10-CM

## 2017-09-29 LAB — BASIC METABOLIC PANEL
ANION GAP: 8 (ref 5–15)
BUN: 14 mg/dL (ref 8–23)
CHLORIDE: 106 mmol/L (ref 98–111)
CO2: 25 mmol/L (ref 22–32)
Calcium: 8.5 mg/dL — ABNORMAL LOW (ref 8.9–10.3)
Creatinine, Ser: 1.3 mg/dL — ABNORMAL HIGH (ref 0.61–1.24)
GFR calc Af Amer: >60 mL/min (ref >60)
GFR calc non Af Amer: 53 mL/min — ABNORMAL LOW (ref >60)
Glucose, Bld: 114 mg/dL — ABNORMAL HIGH (ref 70–99)
POTASSIUM: 4 mmol/L (ref 3.5–5.1)
SODIUM: 139 mmol/L (ref 135–145)

## 2017-09-29 LAB — BRAIN NATRIURETIC PEPTIDE: B NATRIURETIC PEPTIDE 5: 633.6 pg/mL — AB (ref 0.0–100.0)

## 2017-09-29 LAB — HEPARIN LEVEL (UNFRACTIONATED): Heparin Unfractionated: <0.1 IU/mL — ABNORMAL LOW (ref 0.30–0.70)

## 2017-09-29 MED ORDER — AMIODARONE HCL IN DEXTROSE 360-4.14 MG/200ML-% IV SOLN
60.0000 mg/h | INTRAVENOUS | Status: AC
  Administered 2017-09-29 (×2): 60 mg/h via INTRAVENOUS
  Filled 2017-09-29 (×2): qty 200

## 2017-09-29 MED ORDER — HEPARIN (PORCINE) IN NACL 100-0.45 UNIT/ML-% IJ SOLN
1500.0000 [IU]/h | INTRAMUSCULAR | Status: DC
  Administered 2017-09-29: 1200 [IU]/h via INTRAVENOUS
  Administered 2017-09-30: 1500 [IU]/h via INTRAVENOUS
  Filled 2017-09-29 (×2): qty 250

## 2017-09-29 MED ORDER — AMIODARONE HCL IN DEXTROSE 360-4.14 MG/200ML-% IV SOLN
30.0000 mg/h | INTRAVENOUS | Status: DC
  Administered 2017-09-29 – 2017-10-01 (×4): 30 mg/h via INTRAVENOUS
  Filled 2017-09-29 (×4): qty 200

## 2017-09-29 MED ORDER — AMIODARONE LOAD VIA INFUSION
150.0000 mg | Freq: Once | INTRAVENOUS | Status: AC
  Administered 2017-09-29: 150 mg via INTRAVENOUS
  Filled 2017-09-29: qty 83.34

## 2017-09-29 NOTE — Care Management Note (Signed)
Case Management Note Marvetta Gibbons RN,BSN [facility] 1-22 Case Manager  [id number]  Patient Details  Name: Helaman Mecca MRN: [id number] Date of Birth: 25-Mar-1945  Subjective/Objective:   Pt admitted with STEMI s/p stenting                 Action/Plan: PTA pt lived at home with spouse- independent with ADLs/mobility- anticipate return home. Referral received for Life Vest- spoke with Clair Gulling at [facility] on 7/11- order has been received -needed paperwork given to [facility] for approval process. Will plan to fit pt at bedside once approved for LifeVest. Noted pt started on Brilinta- insurance check completed-copay $30/mo- spoke with pt, wife and daughter at bedside coverage info shared on Brilinta.- however pt now in afib today 7/12- unsure if pt will d/c on Brilinta- CM will follow and provide 30 day free card if needed. Pt aware of this and states cost will not be a problem.   Expected Discharge Date:                  Expected Discharge Plan:  Home/Self Care  In-House Referral:     Discharge planning Services  CM Consult, Medication Assistance  Post Acute Care Choice:    Choice offered to:     DME Arranged:  Life vest DME Agency:  [facility]  HH Arranged:    [place] Agency:     Status of Service:  In process, will continue to follow  If discussed at Long Length of Stay Meetings, dates discussed:    Discharge Disposition:   Additional Comments:  Dawayne Patricia, RN 09/29/2017, 11:59 AM

## 2017-09-29 NOTE — Progress Notes (Signed)
Pt in Afib RVR. Will hold ambulation this am. Discussed Afib with pt and wife, gave them Off the Beat book and video to watch. Voiced understanding. Will f/u in pm in rate is down and time permits. [id number] [place], ACSM 10:12 AM 09/29/2017

## 2017-09-29 NOTE — Progress Notes (Signed)
ANTICOAGULATION CONSULT NOTE - Initial Consult  Pharmacy Consult for IV heparin Indication: atrial fibrillation  No Known Allergies  Patient Measurements: Height: 6' (182.9 cm) Weight: 193 lb (87.5 kg) IBW/kg (Calculated) : 77.6 Heparin Dosing Weight: 87.5 kg  Vital Signs: Temp: 98.4 F (36.9 C) (07/12 1223) Temp Source: Oral (07/12 1223) BP: 97/77 (07/12 1200) Pulse Rate: 66 (07/12 1200)  Labs: Recent Labs    09/27/17 0717 09/27/17 0718 09/27/17 0732 09/27/17 1032 09/27/17 1602 09/27/17 2227 09/28/17 0248 09/29/17 0220  HGB 16.0 15.4  --   --   --   --  13.8  --   HCT 47.0 45.8  --   --   --   --  42.2  --   PLT  --  262  --   --   --   --  201  --   CREATININE 1.40*  --  1.50*  --   --   --  1.36* 1.30*  TROPONINI  --   --   --  >65.00* >65.00* >65.00*  --   --     Estimated Creatinine Clearance: 56.4 mL/min (A) (by C-G formula based on SCr of 1.3 mg/dL (H)).   Medical History: Past Medical History:  Diagnosis Date  . COPD (chronic obstructive pulmonary disease) ([place])   . Hypertension   . Low serum testosterone level   . Pneumonia 05/2014  . Shortness of breath dyspnea   . Urinary frequency     Medications:  Scheduled:  . aspirin EC  81 mg Oral Daily  . carvedilol  3.125 mg Oral BID WC  . losartan  25 mg Oral Daily  . rosuvastatin  40 mg Oral Daily  . sodium chloride flush  3 mL Intravenous Q12H  . tamsulosin  0.4 mg Oral Daily  . ticagrelor  90 mg Oral BID    Assessment: 72 yo male with new onset s/p afib.  Pharmacy asked to begin anticoagulation with IV heparin.  CBC WNL.  No overt bleeding or complications noted.  Goal of Therapy:  Heparin level 0.3-0.7 units/ml Monitor platelets by anticoagulation protocol: Yes   Plan:  1. Start IV heparin at rate of 1200 units/hr. 2. Check heparin level 8 hrs after gtt starts. 3. Daily heparin level and CBC. 4. F/u plans for either DCCV or conversion to DOAC if patient does not convert to NSR.  Marguerite Olea, [facility] Clinical Pharmacist Phone [phone]  09/29/2017 12:31 PM

## 2017-09-29 NOTE — Progress Notes (Signed)
ANTICOAGULATION CONSULT NOTE - Follow Up Consult  Pharmacy Consult for heparin Indication: atrial fibrillation  Labs: Recent Labs    09/27/17 0717 09/27/17 0718 09/27/17 0732 09/27/17 1032 09/27/17 1602 09/27/17 2227 09/28/17 0248 09/29/17 0220 09/29/17 2136  HGB 16.0 15.4  --   --   --   --  13.8  --   --   HCT 47.0 45.8  --   --   --   --  42.2  --   --   PLT  --  262  --   --   --   --  201  --   --   HEPARINUNFRC  --   --   --   --   --   --   --   --  <0.10*  CREATININE 1.40*  --  1.50*  --   --   --  1.36* 1.30*  --   TROPONINI  --   --   --  >65.00* >65.00* >65.00*  --   --   --     Assessment: 72yo male undetectable on heparin with initial dosing for Afib.  Goal of Therapy:  Heparin level 0.3-0.7 units/ml   Plan:  Will increase heparin gtt by 4 units/kg/hr to 1500 units/hr and check level in 8 hours.    Wynona Neat, PharmD, BCPS  09/29/2017,11:31 PM

## 2017-09-29 NOTE — Progress Notes (Addendum)
Progress Note  Patient Name: [name] [name] Date of Encounter: 09/29/2017  Primary Cardiologist: Carlyle Dolly, MD   Subjective   Doing well despite afib w/ RVR. Asymptomatic. No palpitations, CP or dyspnea.   Inpatient Medications    Scheduled Meds: . aspirin EC  81 mg Oral Daily  . carvedilol  3.125 mg Oral BID WC  . losartan  25 mg Oral Daily  . rosuvastatin  40 mg Oral Daily  . sodium chloride flush  3 mL Intravenous Q12H  . tamsulosin  0.4 mg Oral Daily  . ticagrelor  90 mg Oral BID   Continuous Infusions: . sodium chloride Stopped (09/28/17 0100)   PRN Meds: sodium chloride, acetaminophen, ALPRAZolam, ondansetron (ZOFRAN) IV, sodium chloride flush   Vital Signs    Vitals:   09/29/17 0300 09/29/17 0358 09/29/17 0400 09/29/17 0500  BP: (!) 93/59  (!) 97/54 (!) 94/53  Pulse: 83  86 82  Resp:      Temp:  97.6 F (36.4 C)    TempSrc:  Oral    SpO2: 95%  97% 97%  Weight:      Height:        Intake/Output Summary (Last 24 hours) at 09/29/2017 0749 Last data filed at 09/29/2017 0100 Gross per 24 hour  Intake 360 ml  Output 2000 ml  Net -1640 ml   Filed Weights   09/27/17 0712  Weight: 193 lb (87.5 kg)    Telemetry    Atrial fibrillation w/ RVR in the 150s - Personally Reviewed  ECG    New onset atrial fibrillation w./ RVR - Personally Reviewed  Physical Exam   GEN: No acute distress.   Neck: No JVD Cardiac: irreguarlly irregular rhythm and tachy rate, no murmurs, rubs, or gallops.  Respiratory: Clear to auscultation bilaterally. GI: Soft, nontender, non-distended  MS: No edema; No deformity. Neuro:  Nonfocal  Psych: Normal affect   Labs    Chemistry Recent Labs  Lab 09/27/17 0717 09/27/17 0732 09/28/17 0248 09/29/17 0220  NA 140  --  137 139  K 3.7  --  4.0 4.0  CL 102  --  103 106  CO2  --   --  23 25  GLUCOSE 183*  --  140* 114*  BUN 18  --  16 14  CREATININE 1.40* 1.50* 1.36* 1.30*  CALCIUM  --   --  8.7* 8.5*  GFRNONAA   --   --  50* 53*  GFRAA  --   --  58* >60  ANIONGAP  --   --  11 8     Hematology Recent Labs  Lab 09/27/17 0717 09/27/17 0718 09/28/17 0248  WBC  --  13.8* 18.9*  RBC  --  4.86 4.48  HGB 16.0 15.4 13.8  HCT 47.0 45.8 42.2  MCV  --  94.2 94.2  MCH  --  31.7 30.8  MCHC  --  33.6 32.7  RDW  --  13.7 13.7  PLT  --  262 201    Cardiac Enzymes Recent Labs  Lab 09/27/17 1032 09/27/17 1602 09/27/17 2227  TROPONINI >65.00* >65.00* >65.00*    Recent Labs  Lab 09/27/17 0728  TROPIPOC 0.33*     BNP Recent Labs  Lab 09/29/17 0220  BNP 633.6*     DDimer No results for input(s): DDIMER in the last 168 hours.   Radiology    Dg Chest Port 1 View  Result Date: 09/28/2017 CLINICAL DATA:  LAD STEMI.  Shortness of  breath. EXAM: PORTABLE CHEST 1 VIEW COMPARISON:  CT chest 09/27/2017, 09/19/2017 and earlier. Chest x-rays 07/24/2017, 04/06/2017 and earlier. FINDINGS: Suboptimal inspiration accounts for crowded bronchovascular markings diffusely and atelectasis in the bases, and accentuates the cardiac silhouette. Taking this into account, cardiac silhouette upper normal in size to mildly enlarged, unchanged. Pleuroparenchymal scarring at the RIGHT base with blunting of the costophrenic angle, chronic and unchanged. Lungs otherwise clear. Pulmonary vascularity normal without evidence of pulmonary edema. IMPRESSION: 1. Suboptimal inspiration accounts for mild bibasilar atelectasis. No acute cardiopulmonary disease otherwise. 2. Chronic pleuroparenchymal scarring at the RIGHT base. 3. Borderline heart size without evidence of pulmonary edema. Electronically Signed   By: Evangeline Dakin M.D.   On: 09/28/2017 09:10    Cardiac Studies   Echo: Study Conclusions  - Left ventricle: The cavity size was normal. There was mild focal basal hypertrophy of the septum. Systolic function was moderately to severely reduced. The estimated ejection fraction was in the range of 30% to 35%.  Severe hypokinesis of the mid to apical anteroseptal, anterior, and anterolateral myocardium. Dyskinetic at apex. Doppler parameters are consistent with abnormal left ventricular relaxation (grade 1 diastolic dysfunction). - Aortic valve: Trileaflet; mildly thickened, mildly calcified leaflets. - Mitral valve: Calcified annulus. There was trivial regurgitation. - Pulmonary arteries: PA peak pressure: 41 mm Hg (S). - Pericardium, extracardiac: A small pericardial effusion was identified.  Impressions:  - New reduction in LV EF with regional wall motion abnormalities in the mid to apical anteroseptum, anterior, and anterolateral walls with dyskinesis at the apex. Small pericardial effusion noted.  Cath: Conclusion     Prox RCA lesion is 30% stenosed.  Prox LAD lesion is 100% stenosed.  Prox Cx lesion is 20% stenosed.  A drug-eluting stent was successfully placed using a STENT SYNERGY DES 3X16.  Post intervention, there is a 0% residual stenosis.  Ost 1st Diag lesion is 100% stenosed.  Balloon angioplasty was performed using a BALLOON SAPPHIRE 2.5X12.  Post intervention, there is a 40% residual stenosis.  Prox LAD to Mid LAD lesion is 40% stenosed.  1. Acute anterior STEMI secondary to occlusion of the mid LAD 2. Successful PTCA/DES x 1 mid LAD 3. Successful PTCA/angioplasty ostium of the Diagonal branch.  4. Mild non-obstructive disease in the RCA, circumflex and mid LAD  Recommendations:  Will admit to the ICU and continue Aggrastat for 6 hours. Will start high intensity statin and a beta blocker. Echo later today.  . Recommend uninterrupted dual antiplatelet therapy with Aspirin 81mg  daily and Ticagrelor 90mg  twice dailyfor a minimum of 12 months (ACS - Class I recommendation).     Patient Profile     72 y.o. male presenting from [facility] 7/10 with anterior STEMI>> s/p PCI. Also with ischemic CM with EF of 30% and new onset  atrial fibrillation.   Assessment & Plan    1. Anterior STEMI:  secondary to occlusion of the mid LAD. Troponin > 65. Successful PTCA/DES x 1 mid LAD. Successful PTCA/angioplasty ostium of the Diagonal branch. Mild non-obstructive disease in the RCA, circumflex and mid LAD. CP free. No dyspnea. Continue DAPT. Brilinta for a minimum of 12 months. Duration of ASA to be determined based on possible initiation of anticoagulation for afib. Continue BB, ARB and high dose statin therapy.   2. Acute CHF/ Ischemic Cardiomyopathy: 2/2 large anterior infarct. EF 30% by echo. Continue medical therapy with BB and ARB (losartan)>> likely change to [facility] if he continues to tolerate ARB ok. Later  add Spironolactone if BP is able to tolerate and if renal function remains stable. Plan is to d/c home with a LifeVest for primary prevention until repeat echo. If EF remains < 35%, despite guidelines directed medical therapy, then he will need referral to EP for ICD. Overall his volume appears stable. No dyspnea. No ventricular arrhthymias on tele.  3. New Atrial Fibrillation w/ RVR: pt noted to go into atrial fibrillation ~0700 today. Rates in the 150s. Asymptomatic. I have reviewed EKG with Dr. Burt Knack. Given his systolic HF, he will not due well with RVR, thus we feel best to initiate IV amiodarone early. Will start with bolus followed by continuous infusion. Will monitor closely on tele. He received dose of carvedilol this morning but plan is to switch over to metoprolol succinate. Will defer anticoagulation choice to Dr. Burt Knack, given he will also need DAPT for newly placed DES. He will likely need triple therapy with ASA, Brilinta + DOAC x 30 days followed by discontinuation of ASA after 1 month. His CHA2DS2 VASc score is at least 4 for CHF, HTN, vascular disease and age 39-74. This patients CHA2DS2-VASc Score and unadjusted Ischemic Stroke Rate (% per year) is equal to 4.8 % stroke rate/year from a score of 4  Above  score calculated as 1 point each if present [CHF, HTN, DM, Vascular=MI/PAD/Aortic Plaque, Age if 65-74, or Male] Above score calculated as 2 points each if present [Age > 75, or Stroke/TIA/TE]   4. H/o Tobacco Abuse: quit smoking ~ 4 years ago.    For questions or updates, please contact [place] Please consult [email or web address] for contact info under Cardiology/STEMI.      Signed, Lyda Jester, PA-C  09/29/2017, 7:49 AM    Patient seen, examined. Available data reviewed. Agree with findings, assessment, and plan as outlined by Lyda Jester, PA-C.  On my exam he is alert and oriented in no distress.  JVP is normal, lungs are clear, heart is irregularly irregular and tachycardic with no murmur or gallop, abdomen is soft and nontender, extremities show no edema.  I agree with the plans as outlined above.  I had a lengthy discussion today with the patient, his wife, and his daughter.  We will continue him on IV amiodarone and hopefully he will convert back to sinus rhythm in the next 12 to 24 hours.  We will start him on IV heparin in case that he does ultimately require cardioversion.  For now we will keep him on aspirin and ticagrelor, but if he requires "triple therapy" I would recommend switching him to Plavix with a 300 mg loading dose followed by 75 mg daily and discontinuing Brilinta.  Would plan to transition him to oral amiodarone for only 1 to 2 months as I suspect his atrial fibrillation is related to his acute MI.  If he converts to sinus rhythm quickly and maintain sinus rhythm over the next 48 hours I would probably continue him on dual antiplatelet therapy with aspirin and ticagrelor rather than committing him to oral anticoagulation.  Otherwise he will continue with post MI medical therapy on a combination of carvedilol, losartan, and Aldactone.  He is tolerating a high intensity statin drug.  Unable to titrate his medications currently because of atrial fibrillation with  RVR and soft blood pressure.  The patient and his wife have canceled their upcoming trip to [place].  I have paperwork that needs to be filled out.  They have a lot of questions about his restrictions, especially as  they pertain to wearing a LifeVest.  All of these questions are answered to the best of my ability today.  Disposition: Keep in the CCU today, possible transfer to a telemetry bed tomorrow if he has converted to sinus rhythm.  Sherren Mocha, M.D. 09/29/2017 11:46 AM

## 2017-09-29 NOTE — Progress Notes (Signed)
Patient went into affib in the 150s about 0703. Ekg confirmed affib, blood pressure stable and patient asymptomatic. Incoming Rn, Careers information officer and this nurse paged PA [employer].

## 2017-09-29 NOTE — Progress Notes (Signed)
  Discussed with Dr. Burt Knack anticipated discharge date for pt. We are projecting Monday 7/15. [facility] notified and will plan to fit pt with LifeVest on Sunday 7/14  [name] [name] [place], [place]

## 2017-09-30 DIAGNOSIS — I255 Ischemic cardiomyopathy: Secondary | ICD-10-CM

## 2017-09-30 DIAGNOSIS — I480 Paroxysmal atrial fibrillation: Secondary | ICD-10-CM

## 2017-09-30 LAB — CBC
HEMATOCRIT: 38.2 % — AB (ref 39.0–52.0)
Hemoglobin: 12.4 g/dL — ABNORMAL LOW (ref 13.0–17.0)
MCH: 30.7 pg (ref 26.0–34.0)
MCHC: 32.5 g/dL (ref 30.0–36.0)
MCV: 94.6 fL (ref 78.0–100.0)
PLATELETS: 177 10*3/uL (ref 150–400)
RBC: 4.04 MIL/uL — ABNORMAL LOW (ref 4.22–5.81)
RDW: 13.8 % (ref 11.5–15.5)
WBC: 13.8 10*3/uL — ABNORMAL HIGH (ref 4.0–10.5)

## 2017-09-30 LAB — HEPARIN LEVEL (UNFRACTIONATED)
Heparin Unfractionated: 0.22 IU/mL — ABNORMAL LOW (ref 0.30–0.70)
Heparin Unfractionated: 0.29 IU/mL — ABNORMAL LOW (ref 0.30–0.70)

## 2017-09-30 MED ORDER — HEPARIN (PORCINE) IN NACL 100-0.45 UNIT/ML-% IJ SOLN
1850.0000 [IU]/h | INTRAMUSCULAR | Status: DC
  Administered 2017-09-30 – 2017-10-03 (×5): 1850 [IU]/h via INTRAVENOUS
  Filled 2017-09-30 (×5): qty 250

## 2017-09-30 MED ORDER — HEPARIN (PORCINE) IN NACL 100-0.45 UNIT/ML-% IJ SOLN: 1700.0000 [IU]/h | INTRAMUSCULAR | Status: DC

## 2017-09-30 MED ORDER — CARVEDILOL 6.25 MG PO TABS
6.2500 mg | ORAL_TABLET | Freq: Two times a day (BID) | ORAL | Status: DC
  Administered 2017-09-30 – 2017-10-04 (×8): 6.25 mg via ORAL
  Filled 2017-09-30 (×8): qty 1

## 2017-09-30 NOTE — Plan of Care (Signed)
  Problem: Education: Goal: Knowledge of General Education information will improve Outcome: Progressing   Problem: Health Behavior/Discharge Planning: Goal: Ability to manage health-related needs will improve Outcome: Progressing   Problem: Clinical Measurements: Goal: Ability to maintain clinical measurements within normal limits will improve Outcome: Progressing Goal: Will remain free from infection Outcome: Progressing Goal: Diagnostic test results will improve Outcome: Progressing Goal: Respiratory complications will improve Outcome: Progressing Goal: Cardiovascular complication will be avoided Outcome: Progressing   Problem: Activity: Goal: Risk for activity intolerance will decrease Outcome: Progressing   Problem: Nutrition: Goal: Adequate nutrition will be maintained Outcome: Progressing   Problem: Elimination: Goal: Will not experience complications related to bowel motility Outcome: Progressing Goal: Will not experience complications related to urinary retention Outcome: Progressing   Problem: Pain Managment: Goal: General experience of comfort will improve Outcome: Progressing   Problem: Safety: Goal: Ability to remain free from injury will improve Outcome: Progressing

## 2017-09-30 NOTE — Progress Notes (Signed)
[place] for IV heparin Indication: atrial fibrillation  No Known Allergies  Patient Measurements: Height: 6' (182.9 cm) Weight: 193 lb (87.5 kg) IBW/kg (Calculated) : 77.6 Heparin Dosing Weight: 87.5 kg  Vital Signs: Temp: 97.5 F (36.4 C) (07/13 1623) Temp Source: Oral (07/13 1623) BP: 100/70 (07/13 1800) Pulse Rate: 127 (07/13 1800)  Labs: Recent Labs    09/27/17 2227 09/28/17 0248 09/29/17 0220 09/29/17 2136 09/30/17 0653 09/30/17 1635  HGB  --  13.8  --   --  12.4*  --   HCT  --  42.2  --   --  38.2*  --   PLT  --  201  --   --  177  --   HEPARINUNFRC  --   --   --  <0.10* 0.22* 0.29*  CREATININE  --  1.36* 1.30*  --   --   --   TROPONINI >65.00*  --   --   --   --   --     Estimated Creatinine Clearance: 56.4 mL/min (A) (by C-G formula based on SCr of 1.3 mg/dL (H)).   Medical History: Past Medical History:  Diagnosis Date  . COPD (chronic obstructive pulmonary disease) ([place])   . Hypertension   . Low serum testosterone level   . Pneumonia 05/2014  . Shortness of breath dyspnea   . Urinary frequency     Medications:  Scheduled:  . aspirin EC  81 mg Oral Daily  . carvedilol  6.25 mg Oral BID WC  . losartan  25 mg Oral Daily  . rosuvastatin  40 mg Oral Daily  . sodium chloride flush  3 mL Intravenous Q12H  . tamsulosin  0.4 mg Oral Daily  . ticagrelor  90 mg Oral BID    Assessment: 72 yo [name] with new onset s/p afib.  Pharmacy initiated anticoagulation with IV heparin.  CBC WNL this morning.  Heparin level 0.29 on heparin drip 1700 units/hr. No bleeding noted. Per RN, no issues with heparin infusion.   Goal of Therapy:  Heparin level 0.3-0.7 units/ml Monitor platelets by anticoagulation protocol: Yes   Plan:  Increase IV heparin to 1850 units/hr. Daily heparin level and CBC. F/u plans for either DCCV or conversion to DOAC if patient does not convert to NSR.  Nicole Cella, RPh Clinical Pharmacist Please  check AMION for all [place] phone numbers After 10:00 PM, call [place] [phone]  09/30/2017 6:54 PM

## 2017-09-30 NOTE — Progress Notes (Signed)
[place] for IV heparin Indication: atrial fibrillation  No Known Allergies  Patient Measurements: Height: 6' (182.9 cm) Weight: 193 lb (87.5 kg) IBW/kg (Calculated) : [name].6 Heparin Dosing Weight: 87.5 kg  Vital Signs: Temp: 98.3 F (36.8 C) (07/13 0355) Temp Source: Oral (07/13 0355) BP: 126/71 (07/13 0800) Pulse Rate: 84 (07/13 0800)  Labs: Recent Labs    09/27/17 1032 09/27/17 1602 09/27/17 2227 09/28/17 0248 09/29/17 0220 09/29/17 2136 09/30/17 0653  HGB  --   --   --  13.8  --   --  12.4*  HCT  --   --   --  42.2  --   --  38.2*  PLT  --   --   --  201  --   --  177  HEPARINUNFRC  --   --   --   --   --  <0.10* 0.22*  CREATININE  --   --   --  1.36* 1.30*  --   --   TROPONINI >65.00* >65.00* >65.00*  --   --   --   --     Estimated Creatinine Clearance: 56.4 mL/min (A) (by C-G formula based on SCr of 1.3 mg/dL (H)).   Medical History: Past Medical History:  Diagnosis Date  . COPD (chronic obstructive pulmonary disease) ([place])   . Hypertension   . Low serum testosterone level   . Pneumonia 05/2014  . Shortness of breath dyspnea   . Urinary frequency     Medications:  Scheduled:  . aspirin EC  81 mg Oral Daily  . carvedilol  3.125 mg Oral BID WC  . losartan  25 mg Oral Daily  . rosuvastatin  40 mg Oral Daily  . sodium chloride flush  3 mL Intravenous Q12H  . tamsulosin  0.4 mg Oral Daily  . ticagrelor  90 mg Oral BID    Assessment: 72 yo [name] [name] with new onset s/p afib.  Pharmacy initiated anticoagulation with IV heparin.  CBC WNL.  No overt bleeding or complications noted.  Heparin level slightly lower than goal this AM.  Per RN, no issues with heparin infusion overnight.  Goal of Therapy:  Heparin level 0.3-0.7 units/ml Monitor platelets by anticoagulation protocol: Yes   Plan:  1. Increase IV heparin to 1700 units/hr. 2. Check heparin level in 8 hrs. 3. Daily heparin level and CBC. 4. F/u plans for  either DCCV or conversion to DOAC if patient does not convert to NSR.  Marguerite Olea, [facility] Clinical Pharmacist Phone [phone]  09/30/2017 8:37 AM

## 2017-09-30 NOTE — Progress Notes (Signed)
Progress Note  Patient Name: [name] [name] Date of Encounter: 09/30/2017  Primary Cardiologist: Carlyle Dolly, MD   Subjective   Postop day #3 anterior STEMI treated with PCI and drug-eluting stent to the LAD.  He went into A. fib with RVR yesterday.  He does complain of 3 out of 10 chest pain.  He is on IV amiodarone and heparin.  Inpatient Medications    Scheduled Meds: . aspirin EC  81 mg Oral Daily  . carvedilol  3.125 mg Oral BID WC  . losartan  25 mg Oral Daily  . rosuvastatin  40 mg Oral Daily  . sodium chloride flush  3 mL Intravenous Q12H  . tamsulosin  0.4 mg Oral Daily  . ticagrelor  90 mg Oral BID   Continuous Infusions: . sodium chloride Stopped (09/28/17 0100)  . amiodarone 30 mg/hr (09/30/17 0600)  . heparin 1,700 Units/hr (09/30/17 0932)   PRN Meds: sodium chloride, acetaminophen, ALPRAZolam, ondansetron (ZOFRAN) IV, sodium chloride flush   Vital Signs    Vitals:   09/30/17 0700 09/30/17 0730 09/30/17 0800 09/30/17 0900  BP:  120/62 126/71 124/86  Pulse: (!) 124 (!) 117 84 (!) 122  Resp:      Temp:      TempSrc:      SpO2: 99% 99% 94% 100%  Weight:      Height:        Intake/Output Summary (Last 24 hours) at 09/30/2017 1022 Last data filed at 09/30/2017 0900 Gross per 24 hour  Intake 1072.49 ml  Output 1950 ml  Net -877.51 ml   Filed Weights   09/27/17 0712  Weight: 193 lb (87.5 kg)    Telemetry    A. fib with RVR- Personally Reviewed  ECG    Not performed today- Personally Reviewed  Physical Exam   GEN: No acute distress.   Neck: No JVD Cardiac:  Irregularly irregular and tachycardic, no murmurs, rubs, or gallops.  Respiratory: Clear to auscultation bilaterally. GI: Soft, nontender, non-distended  MS: No edema; No deformity. Neuro:  Nonfocal  Psych: Normal affect   Labs    Chemistry Recent Labs  Lab 09/27/17 0717 09/27/17 0732 09/28/17 0248 09/29/17 0220  NA 140  --  137 139  K 3.7  --  4.0 4.0  CL 102  --  103  106  CO2  --   --  23 25  GLUCOSE 183*  --  140* 114*  BUN 18  --  16 14  CREATININE 1.40* 1.50* 1.36* 1.30*  CALCIUM  --   --  8.7* 8.5*  GFRNONAA  --   --  50* 53*  GFRAA  --   --  58* >60  ANIONGAP  --   --  11 8     Hematology Recent Labs  Lab 09/27/17 0718 09/28/17 0248 09/30/17 0653  WBC 13.8* 18.9* 13.8*  RBC 4.86 4.48 4.04*  HGB 15.4 13.8 12.4*  HCT 45.8 42.2 38.2*  MCV 94.2 94.2 94.6  MCH 31.7 30.8 30.7  MCHC 33.6 32.7 32.5  RDW 13.7 13.7 13.8  PLT 262 201 177    Cardiac Enzymes Recent Labs  Lab 09/27/17 1032 09/27/17 1602 09/27/17 2227  TROPONINI >65.00* >65.00* >65.00*    Recent Labs  Lab 09/27/17 0728  TROPIPOC 0.33*     BNP Recent Labs  Lab 09/29/17 0220  BNP 633.6*     DDimer No results for input(s): DDIMER in the last 168 hours.   Radiology  No results found.  Cardiac Studies    2D echocardiogram-09/27/2017  Study Conclusions  - Left ventricle: The cavity size was normal. There was mild focal   basal hypertrophy of the septum. Systolic function was moderately   to severely reduced. The estimated ejection fraction was in the   range of 30% to 35%. Severe hypokinesis of the mid to apical   anteroseptal, anterior, and anterolateral myocardium. Dyskinetic   at apex. Doppler parameters are consistent with abnormal left   ventricular relaxation (grade 1 diastolic dysfunction). - Aortic valve: Trileaflet; mildly thickened, mildly calcified   leaflets. - Mitral valve: Calcified annulus. There was trivial regurgitation. - Pulmonary arteries: PA peak pressure: 41 mm Hg (S). - Pericardium, extracardiac: A small pericardial effusion was   identified.  Impressions:  - New reduction in LV EF with regional wall motion abnormalities in   the mid to apical anteroseptum, anterior, and anterolateral walls   with dyskinesis at the apex. Small pericardial effusion noted.   Cardiac catheterization (09/27/2017)  Conclusion     Prox  RCA lesion is 30% stenosed.  Prox LAD lesion is 100% stenosed.  Prox Cx lesion is 20% stenosed.  A drug-eluting stent was successfully placed using a STENT SYNERGY DES 3X16.  Post intervention, there is a 0% residual stenosis.  Ost 1st Diag lesion is 100% stenosed.  Balloon angioplasty was performed using a BALLOON SAPPHIRE 2.5X12.  Post intervention, there is a 40% residual stenosis.  Prox LAD to Mid LAD lesion is 40% stenosed.   1. Acute anterior STEMI secondary to occlusion of the mid LAD 2. Successful PTCA/DES x 1 mid LAD 3. Successful PTCA/angioplasty ostium of the Diagonal branch.  4. Mild non-obstructive disease in the RCA, circumflex and mid LAD  Recommendations:  Will admit to the ICU and continue Aggrastat for 6 hours. Will start high intensity statin and a beta blocker. Echo later today.   . Recommend uninterrupted dual antiplatelet therapy with Aspirin 81mg  daily and Ticagrelor 90mg  twice daily for a minimum of 12 months (ACS - Class I recommendation).     Patient Profile     72 y.o. male with history of tobacco abuse postop day 3 anterior STEMI with ischemic heart myopathy, EF 30% and new onset A. fib with RVR on IV amiodarone and heparin.  He does have residual 3 out of 10 chest pain.  His EKG yesterday shows continued anterolateral ST segment elevation.  Assessment & Plan    1: Anterior STEMI- postop day #3 anterior STEMI treated with PCI and drug-eluting stenting of the mid LAD by Dr. Angelena Form.  His circumflex and RCA were free of significant disease.  He remains on aspirin and Brilinta.  The duration and type of antiplatelet therapy will depend on whether or not he needs oral anticoagulation because of atrial fibrillation.  2: Ischemic cardiomyopathy- EF 30% by 2D echo.  He is on appropriate pharmacotherapy for this.  He may need a LifeVest.  We will recheck a 2D echo prior to discharge to decide.  3: Atrial fibrillation with rapid ventricular response- his  heart rate is in the 120 range.  He is on IV heparin and beta-blocker.  He is also on IV amiodarone.  I am going to increase the beta-blocker dose because of heart rate response.  If he does not convert pharmacologically he may need DC cardioversion on Monday.  We will convert him from IV to p.o. amiodarone on Monday.   I am concerned about his residual mild chest  pain.  He does have residual ST segment elevation on twelve-lead EKG from yesterday.  This may be rate related.  He is on IV amiodarone and heparin.  I am going to increase his carvedilol.  Plan will be to consider DC cardioversion on Monday.   For questions or updates, please contact [place] Please consult [email or web address] for contact info under Cardiology/STEMI.      Signed, Quay Burow, MD  09/30/2017, 10:22 AM

## 2017-09-30 NOTE — Plan of Care (Signed)

## 2017-09-30 NOTE — Progress Notes (Signed)
CARDIAC REHAB PHASE I   PRE:  Rate/Rhythm: 104 A FIB   BP:  Sitting: 97/67      SaO2: 95% RA  MODE:  Ambulation: 270 ft   POST:  Rate/Rhythm: 141 A FIB  BP:  Sitting: 94/76      SaO2: 94% RA   Pt ambulated 270 ft with RW, one assist, gait belt used. Pt took a few rest breaks during walk due to SOB. Pt had steady gait, but was a little shaky. Towards end of walk pt stated he felt "woozy", but could make it back to room chair. Returned pt to recliner with feet elevated. BP was lower, but pt stated he felt better sitting. Pt stated he was cold, got pt a blanket and turned room temp up by 1 degree per pt's request. RN notified about pt being a little shaky. Helped pt with setting up lunch. Pt stated he was grateful for walk. Call bell and phone within reach.    [phone]  Carma Lair MS, ACSM CEP  1:28 PM 09/30/2017

## 2017-10-01 ENCOUNTER — Other Ambulatory Visit: Payer: Self-pay

## 2017-10-01 ENCOUNTER — Inpatient Hospital Stay (HOSPITAL_COMMUNITY): Payer: Medicare Other

## 2017-10-01 DIAGNOSIS — I5021 Acute systolic (congestive) heart failure: Secondary | ICD-10-CM

## 2017-10-01 DIAGNOSIS — J449 Chronic obstructive pulmonary disease, unspecified: Secondary | ICD-10-CM

## 2017-10-01 DIAGNOSIS — J810 Acute pulmonary edema: Secondary | ICD-10-CM

## 2017-10-01 DIAGNOSIS — I213 ST elevation (STEMI) myocardial infarction of unspecified site: Secondary | ICD-10-CM

## 2017-10-01 LAB — BASIC METABOLIC PANEL
Anion gap: 7 (ref 5–15)
BUN: 18 mg/dL (ref 8–23)
CALCIUM: 8.2 mg/dL — AB (ref 8.9–10.3)
CO2: 27 mmol/L (ref 22–32)
CREATININE: 1.51 mg/dL — AB (ref 0.61–1.24)
Chloride: 107 mmol/L (ref 98–111)
GFR calc Af Amer: 51 mL/min — ABNORMAL LOW (ref >60)
GFR, EST NON AFRICAN AMERICAN: 44 mL/min — AB (ref >60)
Glucose, Bld: 114 mg/dL — ABNORMAL HIGH (ref 70–99)
Potassium: 4.3 mmol/L (ref 3.5–5.1)
Sodium: 141 mmol/L (ref 135–145)

## 2017-10-01 LAB — HEPARIN LEVEL (UNFRACTIONATED)
HEPARIN UNFRACTIONATED: 0.5 [IU]/mL (ref 0.30–0.70)
HEPARIN UNFRACTIONATED: 0.5 [IU]/mL (ref 0.30–0.70)

## 2017-10-01 LAB — CBC
HCT: 37.9 % — ABNORMAL LOW (ref 39.0–52.0)
Hemoglobin: 12.2 g/dL — ABNORMAL LOW (ref 13.0–17.0)
MCH: 30.4 pg (ref 26.0–34.0)
MCHC: 32.2 g/dL (ref 30.0–36.0)
MCV: 94.5 fL (ref 78.0–100.0)
Platelets: 191 K/uL (ref 150–400)
RBC: 4.01 MIL/uL — ABNORMAL LOW (ref 4.22–5.81)
RDW: 13.9 % (ref 11.5–15.5)
WBC: 11.1 K/uL — ABNORMAL HIGH (ref 4.0–10.5)

## 2017-10-01 LAB — TROPONIN I
Troponin I: 20.44 ng/mL (ref <0.03)
Troponin I: 10.85 ng/mL
Troponin I: 10.67 ng/mL

## 2017-10-01 LAB — BRAIN NATRIURETIC PEPTIDE: B Natriuretic Peptide: 903.1 pg/mL — ABNORMAL HIGH (ref 0.0–100.0)

## 2017-10-01 MED ORDER — FUROSEMIDE 10 MG/ML IJ SOLN
40.0000 mg | Freq: Two times a day (BID) | INTRAMUSCULAR | Status: DC
  Administered 2017-10-01 – 2017-10-03 (×4): 40 mg via INTRAVENOUS
  Filled 2017-10-01 (×4): qty 4

## 2017-10-01 MED ORDER — AMIODARONE HCL 200 MG PO TABS
400.0000 mg | ORAL_TABLET | Freq: Two times a day (BID) | ORAL | Status: DC
  Administered 2017-10-01 – 2017-10-04 (×7): 400 mg via ORAL
  Filled 2017-10-01 (×7): qty 2

## 2017-10-01 MED ORDER — LEVALBUTEROL HCL 0.63 MG/3ML IN NEBU
INHALATION_SOLUTION | RESPIRATORY_TRACT | Status: AC
  Administered 2017-10-01: 0.63 mg via RESPIRATORY_TRACT
  Filled 2017-10-01: qty 3

## 2017-10-01 MED ORDER — FUROSEMIDE 10 MG/ML IJ SOLN
INTRAMUSCULAR | Status: AC
  Filled 2017-10-01: qty 4

## 2017-10-01 MED ORDER — FUROSEMIDE 10 MG/ML IJ SOLN
40.0000 mg | Freq: Once | INTRAMUSCULAR | Status: AC
  Administered 2017-10-01: 40 mg via INTRAVENOUS

## 2017-10-01 MED ORDER — DOCUSATE SODIUM 100 MG PO CAPS
200.0000 mg | ORAL_CAPSULE | Freq: Two times a day (BID) | ORAL | Status: DC
  Administered 2017-10-01 – 2017-10-02 (×3): 200 mg via ORAL
  Filled 2017-10-01 (×4): qty 2

## 2017-10-01 MED ORDER — LEVALBUTEROL HCL 0.63 MG/3ML IN NEBU
0.6300 mg | INHALATION_SOLUTION | Freq: Four times a day (QID) | RESPIRATORY_TRACT | Status: DC | PRN
  Administered 2017-10-01: 0.63 mg via RESPIRATORY_TRACT

## 2017-10-01 NOTE — Progress Notes (Signed)
ANTICOAGULATION CONSULT NOTE - Follow Up Consult  Pharmacy Consult for heparin Indication: atrial fibrillation  Labs: Recent Labs    09/29/17 0220  09/30/17 0653 09/30/17 1635 10/01/17 0247  HGB  --   --  12.4*  --  12.2*  HCT  --   --  38.2*  --  37.9*  PLT  --   --  177  --  191  HEPARINUNFRC  --    < > 0.22* 0.29* 0.50  CREATININE 1.30*  --   --   --   --    < > = values in this interval not displayed.    Assessment/Plan:  72yo male therapeutic on heparin after rate changes. Will continue gtt at current rate and confirm stable with additional level.   Wynona Neat, PharmD, BCPS  10/01/2017,4:13 AM

## 2017-10-01 NOTE — Progress Notes (Addendum)
CRITICAL VALUE ALERT  Critical Value:  Troponin 10.37  Date & Time Notied:  10/01/17 @ 2335  Provider Notified: MD Falk-Martin  Orders Received/Actions taken: None Recieved

## 2017-10-01 NOTE — Consult Note (Signed)
PULMONARY / CRITICAL CARE MEDICINE   Name: [name] [name] MRN: [id number] DOB: [birth year]    ADMISSION DATE:  09/27/2017 CONSULTATION DATE: October 01, 2017  REFERRING MD: Gwenlyn Found   CHIEF COMPLAINT: Chest pain and shortness of breath  HISTORY OF PRESENT ILLNESS:   [name] [name] is a 72 year old male with known history of mild to moderate COPD, coming in with ST elevation MI status post drug-eluting stent to his LAD shortly after went to atrial fibrillation with rapid ventricular response converted to sinus rhythm yesterday.  Patient stated that he presented with left shoulder pain neck pain and upper chest pain that was 8-9 out of 10 was diagnosed with ST elevation MI was taking to the Cath Lab where a drug-eluting stent was applied to his LAD.  Patient has an ejection fraction of 30 to 35% stated that his chest pain has completely resolved after the stent but over the last day or 2 he has been having recurrent similar pain but less intense.  Overnight all of a sudden he started having worsening shortness of breath and he could not roll himself in bed due to trouble breathing.  Patient denies any fever no chills no rigors.  Patient had some clear sputum with blood-tinged.  I was called to assist the patient due to his history which patient gives a history of empyema and pneumonia in the past.  Patient states that this is not similar to his previous pneumonias.  Chest x-ray from today showed Multifocal infiltrates.  CT of the chest on July 10 and July 2 showed no signs of pneumonia.  PAST MEDICAL HISTORY :  He  has a past medical history of COPD (chronic obstructive pulmonary disease) ([place]), Hypertension, Low serum testosterone level, Pneumonia (05/2014), Shortness of breath dyspnea, and Urinary frequency.  PAST SURGICAL HISTORY: He  has a past surgical history that includes Hernia repair; Appendectomy; Video bronchoscopy (N/A, 06/27/2014); Video assisted thoracoscopy (Right, 06/27/2014); Empyema drainage  (Right, 06/27/2014); Varicose vein surgery; TEE without cardioversion (N/A, 07/14/2014); Colonoscopy (2011); Colonoscopy (N/A, 03/03/2015); Hemorrhoid banding (N/A, 03/03/2015); LEFT HEART CATH AND CORONARY ANGIOGRAPHY (N/A, 09/27/2017); Intravascular Ultrasound/IVUS (N/A, 09/27/2017); and Coronary/Graft Acute MI Revascularization (N/A, 09/27/2017).  No Known Allergies  No current facility-administered medications on file prior to encounter.    Current Outpatient Medications on File Prior to Encounter  Medication Sig  . ALPRAZolam (XANAX) 0.25 MG tablet Take 0.25 mg by mouth at bedtime as needed for sleep.  Marland Kitchen amLODipine (NORVASC) 5 MG tablet Take 5 mg by mouth daily.   Marland Kitchen aspirin EC 81 MG tablet Take 81 mg by mouth daily.  . Calcium Citrate-Vitamin D (CALCIUM CITRATE + D3 PO) Take 1 tablet by mouth daily.  [name] [name] Oil 350 MG CAPS Take 350 mg by mouth daily.  . Lactobacillus (ACIDOPHILUS PROBIOTIC) 10 MG TABS Take 10 mg by mouth daily.   . Multiple Vitamins-Minerals (MULTIVITAMINS THER. W/MINERALS) TABS tablet Take 1 tablet by mouth daily.  . Multiple Vitamins-Minerals (SENIOR VITES PO) Take 1 tablet by mouth daily.  . NON FORMULARY Apply 1 application topically daily. Fluc2% Toe Nail Drops for Fungus  . PHYTOSTEROLS PO Take 1 tablet by mouth 2 (two) times daily.   . rosuvastatin (CRESTOR) 5 MG tablet Take 5 mg by mouth daily.  . tamsulosin (FLOMAX) 0.4 MG CAPS capsule Take 0.4 mg by mouth daily.   Marland Kitchen testosterone (ANDROGEL) 50 MG/5GM (1%) GEL Place 5 g onto the skin at bedtime.   Marland Kitchen HYDROcodone-acetaminophen (NORCO) 10-325 MG tablet Take 1  tablet by mouth every 4 (four) hours as needed for severe pain ((score 7 to 10)). (Patient not taking: Reported on 09/27/2017)  . methocarbamol (ROBAXIN) 500 MG tablet Take 1 tablet (500 mg total) by mouth every 6 (six) hours as needed for muscle spasms. (Patient not taking: Reported on 09/27/2017)    FAMILY HISTORY:  His family history includes COPD in his mother;  Hypertension in his father and mother; Tuberculosis in his father. There is no history of Colon cancer.  SOCIAL HISTORY: He  reports that he quit smoking about 3 years ago. His smoking use included pipe. He has never used smokeless tobacco. He reports that he drinks alcohol. He reports that he does not use drugs.  REVIEW OF SYSTEMS:   All 11 point system review were unremarkable other than what is mentioned history of present illness    VITAL SIGNS: BP 102/66 (BP Location: Right Arm)   Pulse 75   Temp (!) 97.4 F (36.3 C) (Oral)   Resp (!) 22   Ht 6' (1.829 m)   Wt 90.4 kg (199 lb 4.8 oz)   SpO2 93%   BMI 27.03 kg/m   HEMODYNAMICS:    VENTILATOR SETTINGS:    INTAKE / OUTPUT: I/O last 3 completed shifts: In: 3500 [P.O.:1910; I.V.:1590] Out: 4030 [Urine:4030]  PHYSICAL EXAMINATION: General: Not in distress not toxic looking Neuro: Alert oriented time place and person moving all extremities power 5/5 all over HEENT: Moist mucous membranes Cardiovascular: Normal heart sounds no murmurs Lungs: Few bilateral basal crackles no wheezing or dullness Abdomen: Soft no tenderness no organomegaly Musculoskeletal: No lower limb edema Skin: No rash  LABS:  BMET Recent Labs  Lab 09/28/17 0248 09/29/17 0220 10/01/17 0247  NA 137 139 141  K 4.0 4.0 4.3  CL 103 106 107  CO2 23 25 27   BUN 16 14 18   CREATININE 1.36* 1.30* 1.51*  GLUCOSE 140* 114* 114*    Electrolytes Recent Labs  Lab 09/28/17 0248 09/29/17 0220 10/01/17 0247  CALCIUM 8.7* 8.5* 8.2*    CBC Recent Labs  Lab 09/28/17 0248 09/30/17 0653 10/01/17 0247  WBC 18.9* 13.8* 11.1*  HGB 13.8 12.4* 12.2*  HCT 42.2 38.2* 37.9*  PLT 201 177 191    Coag's No results for input(s): APTT, INR in the last 168 hours.  Sepsis Markers No results for input(s): LATICACIDVEN, PROCALCITON, O2SATVEN in the last 168 hours.  ABG No results for input(s): PHART, PCO2ART, PO2ART in the last 168 hours.  Liver  Enzymes No results for input(s): AST, ALT, ALKPHOS, BILITOT, ALBUMIN in the last 168 hours.  Cardiac Enzymes Recent Labs  Lab 09/27/17 1602 09/27/17 2227 10/01/17 0247  TROPONINI >65.00* >65.00* 20.44*    Glucose No results for input(s): GLUCAP in the last 168 hours.  Imaging Dg Chest Port 1 View  Result Date: 10/01/2017 CLINICAL DATA:  73 year old male with history of shortness of breath. EXAM: PORTABLE CHEST 1 VIEW COMPARISON:  Chest x-ray 09/28/2017. FINDINGS: Worsening patchy multifocal ill-defined airspace disease throughout the right lung, concerning for progressive multilobar pneumonia. Increasing moderate right pleural effusion. Left lung is clear. No left pleural effusion. Heart size is normal. No evidence of pulmonary edema. Upper mediastinal contours are within normal limits. IMPRESSION: 1. Worsening multilobar bronchopneumonia in the right lung with increasing right moderate pleural effusion. Followup PA and lateral chest X-ray is recommended in 3-4 weeks following trial of antibiotic therapy to ensure resolution and exclude underlying malignancy. Electronically Signed   By: Vinnie Langton  M.D.   On: 10/01/2017 09:18     DISCUSSION: [name] is a 72 year old male who was admitted 4 days ago with acute ST elevation MI developed H fibrillation with response now back to sinus rhythm patient had a relief of his chest pain after his cardiac cath and stent but now he is having recurrent chest pain and shortness of breath with some EKG changes.  Pneumonia is less likely especially with his chest x-ray worsening overnight and his sudden symptoms that are associated with chest pain patient also does not look toxic or septic looking with the extensive amount of infiltrate on his chest x-ray I would expect him to look much sicker.  Patient white count is also going down which argues against an infection and no other symptoms like fever or chills.  I do suspect that this patient is having  acute pulmonary edema from his atrial fibrillation and also from his possible ongoing ischemia.  ASSESSMENT / PLAN:  Acute pulmonary edema Acute ST elevation MI Atrial fibrillation with rapid ventricular response now in sinus rhythm Mild to moderate COPD   Plan: -I agree with Xopenex nebulizer since his atrial fibrillation -I would recommend aggressive diuresis Rate control patient is now in sinus rhythm-  -Patient has some EKG changes I would defer that to cardiology for possible ongoing ischemia as a cause of his pulmonary edema -Trend cardiac enzymes -I will hold off on starting any antibiotics at the moment patient does not have any other signs of infection his chest x-ray has worsened overnight and is suggestive of pulmonary edema. -We appreciate the consult will continue following  FAMILY  - Updates: Patient and his wife were updated at the bedside on October 01, 2017 bedside nurse was available.     Pulmonary and [place] Pager: [phone]  10/01/2017, 12:32 PM

## 2017-10-01 NOTE — Progress Notes (Signed)
[place] for IV heparin Indication: atrial fibrillation  No Known Allergies  Patient Measurements: Height: 6' (182.9 cm) Weight: 199 lb 4.8 oz (90.4 kg) IBW/kg (Calculated) : 77.6 Heparin Dosing Weight: 87.5 kg  Vital Signs: Temp: 97.4 F (36.3 C) (07/14 0846) Temp Source: Oral (07/14 0846) BP: 112/70 (07/14 0900) Pulse Rate: 92 (07/14 0900)  Labs: Recent Labs    09/29/17 0220  09/30/17 0653 09/30/17 1635 10/01/17 0247 10/01/17 0854  HGB  --   --  12.4*  --  12.2*  --   HCT  --   --  38.2*  --  37.9*  --   PLT  --   --  177  --  191  --   HEPARINUNFRC  --    < > 0.22* 0.29* 0.50 0.50  CREATININE 1.30*  --   --   --  1.51*  --   TROPONINI  --   --   --   --  20.44*  --    < > = values in this interval not displayed.    Estimated Creatinine Clearance: 48.5 mL/min (A) (by C-G formula based on SCr of 1.51 mg/dL (H)).   Medical History: Past Medical History:  Diagnosis Date  . COPD (chronic obstructive pulmonary disease) ([place])   . Hypertension   . Low serum testosterone level   . Pneumonia 05/2014  . Shortness of breath dyspnea   . Urinary frequency     Medications:  Scheduled:  . amiodarone  400 mg Oral BID  . aspirin EC  81 mg Oral Daily  . carvedilol  6.25 mg Oral BID WC  . furosemide  40 mg Intravenous Q12H  . losartan  25 mg Oral Daily  . rosuvastatin  40 mg Oral Daily  . sodium chloride flush  3 mL Intravenous Q12H  . tamsulosin  0.4 mg Oral Daily  . ticagrelor  90 mg Oral BID   . sodium chloride 20 mL/hr at 10/01/17 0700  . heparin 1,850 Units/hr (10/01/17 0900)     Assessment: 72 yo male with new onset s/p afib.  Pharmacy initiated anticoagulation with IV heparin.  CBC WNL this morning.  Heparin level 0.5 on heparin drip 1850 units/hr. No bleeding noted. Per RN, no issues with heparin infusion.   Goal of Therapy:  Heparin level 0.3-0.7 units/ml Monitor platelets by anticoagulation protocol: Yes   Plan:   Continue IV heparin at 1850 units/hr. Daily heparin level and CBC. F/u plans for either DCCV or conversion to DOAC if patient does not convert to NSR.  Marguerite Olea, [facility] Clinical Pharmacist Phone [phone]  10/01/2017 10:54 AM

## 2017-10-01 NOTE — Progress Notes (Signed)
Progress Note  Patient Name: [name] [name] Date of Encounter: 10/01/2017  Primary Cardiologist: Carlyle Dolly, MD   Subjective   Postop day #4 anterior STEMI treated with PCI and drug-eluting stent to the LAD.  He went into A. fib with RVR 2 days ago and converted to sinus rhythm last night..  He does complain of 3 out of 10 chest pain.  He is on IV amiodarone and heparin.  He is more short of breath this morning and has some residual left shoulder pain which improved with Tylenol.  His EKG shows Q waves across his precordium with persistent ST segment elevation in the lateral leads.  Inpatient Medications    Scheduled Meds: . amiodarone  400 mg Oral BID  . aspirin EC  81 mg Oral Daily  . carvedilol  6.25 mg Oral BID WC  . furosemide      . furosemide  40 mg Intravenous Q12H  . levalbuterol      . losartan  25 mg Oral Daily  . rosuvastatin  40 mg Oral Daily  . sodium chloride flush  3 mL Intravenous Q12H  . tamsulosin  0.4 mg Oral Daily  . ticagrelor  90 mg Oral BID   Continuous Infusions: . sodium chloride 20 mL/hr at 09/30/17 1000  . heparin 1,850 Units/hr (10/01/17 0810)   PRN Meds: sodium chloride, acetaminophen, ALPRAZolam, levalbuterol, ondansetron (ZOFRAN) IV, sodium chloride flush   Vital Signs    Vitals:   10/01/17 0400 10/01/17 0500 10/01/17 0600 10/01/17 0805  BP: 101/68 (!) 103/56 116/72 (!) 148/75  Pulse: 78 78 83 94  Resp:      Temp: 98.1 F (36.7 C)     TempSrc: Oral     SpO2: 94% 93% 96%   Weight:      Height:        Intake/Output Summary (Last 24 hours) at 10/01/2017 0835 Last data filed at 10/01/2017 1245 Gross per 24 hour  Intake 2479.28 ml  Output 2480 ml  Net -0.72 ml   Filed Weights   09/27/17 0712  Weight: 193 lb (87.5 kg)    Telemetry    Normal sinus rhythm/sinus tachycardia.- Personally Reviewed  ECG    Sinus rhythm at 96 with Q waves across his precordium and persistent ST segment elevation.- Personally  Reviewed  Physical Exam   GEN: No acute distress.   Neck: No JVD Cardiac:  Regular rate and rhythm and tachycardic, no murmurs, rubs, or gallops.  Respiratory:  Right basilar wheezes GI: Soft, nontender, non-distended  MS: No edema; No deformity. Neuro:  Nonfocal  Psych: Normal affect   Labs    Chemistry Recent Labs  Lab 09/28/17 0248 09/29/17 0220 10/01/17 0247  NA 137 139 141  K 4.0 4.0 4.3  CL 103 106 107  CO2 23 25 27   GLUCOSE 140* 114* 114*  BUN 16 14 18   CREATININE 1.36* 1.30* 1.51*  CALCIUM 8.7* 8.5* 8.2*  GFRNONAA 50* 53* 44*  GFRAA 58* >60 51*  ANIONGAP 11 8 7      Hematology Recent Labs  Lab 09/28/17 0248 09/30/17 0653 10/01/17 0247  WBC 18.9* 13.8* 11.1*  RBC 4.48 4.04* 4.01*  HGB 13.8 12.4* 12.2*  HCT 42.2 38.2* 37.9*  MCV 94.2 94.6 94.5  MCH 30.8 30.7 30.4  MCHC 32.7 32.5 32.2  RDW 13.7 13.8 13.9  PLT 201 177 191    Cardiac Enzymes Recent Labs  Lab 09/27/17 1032 09/27/17 1602 09/27/17 2227  TROPONINI >65.00* >65.00* >65.00*  Recent Labs  Lab 09/27/17 0728  TROPIPOC 0.33*     BNP Recent Labs  Lab 09/29/17 0220  BNP 633.6*     DDimer No results for input(s): DDIMER in the last 168 hours.   Radiology    No results found.  Cardiac Studies    2D echocardiogram-09/27/2017  Study Conclusions  - Left ventricle: The cavity size was normal. There was mild focal   basal hypertrophy of the septum. Systolic function was moderately   to severely reduced. The estimated ejection fraction was in the   range of 30% to 35%. Severe hypokinesis of the mid to apical   anteroseptal, anterior, and anterolateral myocardium. Dyskinetic   at apex. Doppler parameters are consistent with abnormal left   ventricular relaxation (grade 1 diastolic dysfunction). - Aortic valve: Trileaflet; mildly thickened, mildly calcified   leaflets. - Mitral valve: Calcified annulus. There was trivial regurgitation. - Pulmonary arteries: PA peak pressure:  41 mm Hg (S). - Pericardium, extracardiac: A small pericardial effusion was   identified.  Impressions:  - New reduction in LV EF with regional wall motion abnormalities in   the mid to apical anteroseptum, anterior, and anterolateral walls   with dyskinesis at the apex. Small pericardial effusion noted.   Cardiac catheterization (09/27/2017)  Conclusion     Prox RCA lesion is 30% stenosed.  Prox LAD lesion is 100% stenosed.  Prox Cx lesion is 20% stenosed.  A drug-eluting stent was successfully placed using a STENT SYNERGY DES 3X16.  Post intervention, there is a 0% residual stenosis.  Ost 1st Diag lesion is 100% stenosed.  Balloon angioplasty was performed using a BALLOON SAPPHIRE 2.5X12.  Post intervention, there is a 40% residual stenosis.  Prox LAD to Mid LAD lesion is 40% stenosed.   1. Acute anterior STEMI secondary to occlusion of the mid LAD 2. Successful PTCA/DES x 1 mid LAD 3. Successful PTCA/angioplasty ostium of the Diagonal branch.  4. Mild non-obstructive disease in the RCA, circumflex and mid LAD  Recommendations:  Will admit to the ICU and continue Aggrastat for 6 hours. Will start high intensity statin and a beta blocker. Echo later today.   . Recommend uninterrupted dual antiplatelet therapy with Aspirin 81mg  daily and Ticagrelor 90mg  twice daily for a minimum of 12 months (ACS - Class I recommendation).     Patient Profile     72 y.o. male with history of tobacco abuse postop day 4 anterior STEMI with ischemic heart myopathy, EF 30% and new onset A. fib with RVR on IV amiodarone and heparin.  He converted to sinus rhythm last night.  He does have residual 3 out of 10 chest pain and some left shoulder pain which is gradually improving and gets better with Tylenol.  His EKG yesterday shows continued anterolateral ST segment elevation.  His major issue is of increasing shortness of breath this morning.  His weight is up several pounds as  well.  Assessment & Plan    1: Anterior STEMI- postop day #4 anterior STEMI treated with PCI and drug-eluting stenting of the mid LAD by Dr. Angelena Form.  His circumflex and RCA were free of significant disease.  He remains on aspirin and Brilinta.  The duration and type of antiplatelet therapy will depend on whether or not he needs oral anticoagulation because of atrial fibrillation.  His EKG shows persistent anterolateral ST segment elevation.  He did have a dense anteroapical wall motion abnormality.  It is unclear to me whether this is representative  of "aneurysm formation or whether he has had subsequent occlusion of his LAD stent.  I am going to get serial enzymes on him.  2: Ischemic cardiomyopathy- EF 30% by 2D echo.  He is on appropriate pharmacotherapy for this.  He may need a LifeVest.  We will recheck a 2D echo prior to discharge to decide.  3: Atrial fibrillation with rapid ventricular response- he was in atrial fibrillation when I saw him yesterday morning on IV amiodarone and heparin.  He has since converted to sinus rhythm last night.  He remains on IV amiodarone which I am going to transition to p.o. 400 mg p.o. twice daily.  Given the fact that his A. fib was in the setting of an acute acute anterior MI he may not need long-term oral anticoagulation if he has no recurrent A. Fib.  4: Shortness of breath- he complains of increasing shortness of breath this morning.  He does have a long history of tobacco abuse.  He has some basilar wheezes and his weight is up.  It is unclear to me whether this is related to COPD, volume overload heart failure or Brilinta.  I am going to give him IV Lasix, Xopenex nebulizer treatment, will check a BNP and a portable chest x-ray.  I am concerned about his residual mild chest pain.  He does have residual ST segment elevation on twelve-lead EKG from yesterday.  This may be rate related although his EKG this morning in sinus rhythm with a rate of 96 continues  to show persistent ST segment elevation.  He is on IV amiodarone and heparin.  I am going to increase his carvedilol.   For questions or updates, please contact [place] Please consult [email or web address] for contact info under Cardiology/STEMI.      Signed, Quay Burow, MD  10/01/2017, 8:35 AM

## 2017-10-01 NOTE — Progress Notes (Signed)
Attempted several times today to watch pertinent patient education videos, but no answer when we dialed [phone]. Will report issues and hopefully he can watch them tomorrow.

## 2017-10-01 NOTE — Progress Notes (Signed)
CRITICAL VALUE ALERT  Critical Value:  Troponin 20.44  Date & Time Notied:  10/01/2017 0945  Provider Notified: Dr.Berry  Orders Received/Actions taken: Continue to monitor and will repeat in 6 hours.

## 2017-10-02 ENCOUNTER — Inpatient Hospital Stay (HOSPITAL_COMMUNITY): Payer: Medicare Other

## 2017-10-02 DIAGNOSIS — R0609 Other forms of dyspnea: Secondary | ICD-10-CM

## 2017-10-02 DIAGNOSIS — I5021 Acute systolic (congestive) heart failure: Secondary | ICD-10-CM

## 2017-10-02 DIAGNOSIS — I480 Paroxysmal atrial fibrillation: Secondary | ICD-10-CM

## 2017-10-02 DIAGNOSIS — I4891 Unspecified atrial fibrillation: Secondary | ICD-10-CM

## 2017-10-02 DIAGNOSIS — R0602 Shortness of breath: Secondary | ICD-10-CM

## 2017-10-02 LAB — CBC
HEMATOCRIT: 37.4 % — AB (ref 39.0–52.0)
Hemoglobin: 12.2 g/dL — ABNORMAL LOW (ref 13.0–17.0)
MCH: 30.3 pg (ref 26.0–34.0)
MCHC: 32.6 g/dL (ref 30.0–36.0)
MCV: 92.8 fL (ref 78.0–100.0)
Platelets: 214 10*3/uL (ref 150–400)
RBC: 4.03 MIL/uL — ABNORMAL LOW (ref 4.22–5.81)
RDW: 13.6 % (ref 11.5–15.5)
WBC: 11.9 10*3/uL — AB (ref 4.0–10.5)

## 2017-10-02 LAB — BASIC METABOLIC PANEL
ANION GAP: 9 (ref 5–15)
BUN: 18 mg/dL (ref 8–23)
CALCIUM: 8.1 mg/dL — AB (ref 8.9–10.3)
CO2: 28 mmol/L (ref 22–32)
Chloride: 105 mmol/L (ref 98–111)
Creatinine, Ser: 1.53 mg/dL — ABNORMAL HIGH (ref 0.61–1.24)
GFR calc Af Amer: 51 mL/min — ABNORMAL LOW (ref >60)
GFR, EST NON AFRICAN AMERICAN: 44 mL/min — AB (ref >60)
Glucose, Bld: 114 mg/dL — ABNORMAL HIGH (ref 70–99)
Potassium: 3.8 mmol/L (ref 3.5–5.1)
Sodium: 142 mmol/L (ref 135–145)

## 2017-10-02 LAB — STREP PNEUMONIAE URINARY ANTIGEN: STREP PNEUMO URINARY ANTIGEN: NEGATIVE

## 2017-10-02 LAB — HEPARIN LEVEL (UNFRACTIONATED): Heparin Unfractionated: 0.44 IU/mL (ref 0.30–0.70)

## 2017-10-02 LAB — PROCALCITONIN: Procalcitonin: <0.1 ng/mL

## 2017-10-02 NOTE — Plan of Care (Signed)
  Problem: Nutrition: Goal: Adequate nutrition will be maintained Outcome: Progressing   Problem: Elimination: Goal: Will not experience complications related to urinary retention Outcome: Progressing   Problem: Pain Managment: Goal: General experience of comfort will improve Outcome: Progressing   Problem: Safety: Goal: Ability to remain free from injury will improve Outcome: Progressing   Problem: Skin Integrity: Goal: Risk for impaired skin integrity will decrease Outcome: Progressing   Problem: Cardiac: Goal: Vascular access site(s) Level 0-1 will be maintained Outcome: Progressing   Problem: Education: Goal: Knowledge of disease or condition will improve Outcome: Progressing   Problem: Activity: Goal: Ability to tolerate increased activity will improve Outcome: Progressing   Problem: Cardiac: Goal: Ability to achieve and maintain adequate cardiopulmonary perfusion will improve Outcome: Progressing

## 2017-10-02 NOTE — Progress Notes (Addendum)
Progress Note  Patient Name: [name] [name] Date of Encounter: 10/02/2017  Primary Cardiologist: Carlyle Dolly, MD   Subjective   Chest pain improved.  Still coughing up some blood tinged sputum.  BP and O2 sats have been stable.  Patient feels better today. Echo pending.  Patient has questions about code status.  Inpatient Medications    Scheduled Meds: . amiodarone  400 mg Oral BID  . aspirin EC  81 mg Oral Daily  . carvedilol  6.25 mg Oral BID WC  . docusate sodium  200 mg Oral BID  . furosemide  40 mg Intravenous Q12H  . losartan  25 mg Oral Daily  . rosuvastatin  40 mg Oral Daily  . sodium chloride flush  3 mL Intravenous Q12H  . tamsulosin  0.4 mg Oral Daily  . ticagrelor  90 mg Oral BID   Continuous Infusions: . sodium chloride 10 mL/hr at 10/02/17 0700  . heparin 1,850 Units/hr (10/02/17 0700)   PRN Meds: sodium chloride, acetaminophen, ALPRAZolam, levalbuterol, ondansetron (ZOFRAN) IV, sodium chloride flush   Vital Signs    Vitals:   10/02/17 0600 10/02/17 0700 10/02/17 0807 10/02/17 0809  BP: 123/72 120/73  125/64  Pulse: 80 82  89  Resp:      Temp:   97.6 F (36.4 C)   TempSrc:   Oral   SpO2: 100% 94%    Weight:      Height:        Intake/Output Summary (Last 24 hours) at 10/02/2017 0948 Last data filed at 10/02/2017 0933 Gross per 24 hour  Intake 1813.82 ml  Output 2725 ml  Net -911.18 ml   Filed Weights   09/27/17 0712 10/01/17 0900 10/02/17 0544  Weight: 193 lb (87.5 kg) 199 lb 4.8 oz (90.4 kg) 196 lb 9.6 oz (89.2 kg)    Telemetry    NSR - Personally Reviewed  ECG    NSR, persistnet anterior ST elevation- less prominent than prior- Personally Reviewed  Physical Exam   GEN: No acute distress.   Neck: No JVD Cardiac: RRR, no murmurs, rubs, or gallops.  Respiratory: Clear to auscultation bilaterally. GI: Soft, nontender, non-distended  MS: No edema; No deformity. Neuro:  Nonfocal  Psych: Normal affect   Labs     Chemistry Recent Labs  Lab 09/29/17 0220 10/01/17 0247 10/02/17 0242  NA 139 141 142  K 4.0 4.3 3.8  CL 106 107 105  CO2 25 27 28   GLUCOSE 114* 114* 114*  BUN 14 18 18   CREATININE 1.30* 1.51* 1.53*  CALCIUM 8.5* 8.2* 8.1*  GFRNONAA 53* 44* 44*  GFRAA >60 51* 51*  ANIONGAP 8 7 9      Hematology Recent Labs  Lab 09/30/17 0653 10/01/17 0247 10/02/17 0242  WBC 13.8* 11.1* 11.9*  RBC 4.04* 4.01* 4.03*  HGB 12.4* 12.2* 12.2*  HCT 38.2* 37.9* 37.4*  MCV 94.6 94.5 92.8  MCH 30.7 30.4 30.3  MCHC 32.5 32.2 32.6  RDW 13.8 13.9 13.6  PLT 177 191 214    Cardiac Enzymes Recent Labs  Lab 09/27/17 2227 10/01/17 0247 10/01/17 1318 10/01/17 2138  TROPONINI >65.00* 20.44* 10.85* 10.67*    Recent Labs  Lab 09/27/17 0728  TROPIPOC 0.33*     BNP Recent Labs  Lab 09/29/17 0220 10/01/17 0247  BNP 633.6* 903.1*     DDimer No results for input(s): DDIMER in the last 168 hours.   Radiology    Dg Chest Port 1 View  Result Date: 10/01/2017  CLINICAL DATA:  72 year old male with history of shortness of breath. EXAM: PORTABLE CHEST 1 VIEW COMPARISON:  Chest x-ray 09/28/2017. FINDINGS: Worsening patchy multifocal ill-defined airspace disease throughout the right lung, concerning for progressive multilobar pneumonia. Increasing moderate right pleural effusion. Left lung is clear. No left pleural effusion. Heart size is normal. No evidence of pulmonary edema. Upper mediastinal contours are within normal limits. IMPRESSION: 1. Worsening multilobar bronchopneumonia in the right lung with increasing right moderate pleural effusion. Followup PA and lateral chest X-ray is recommended in 3-4 weeks following trial of antibiotic therapy to ensure resolution and exclude underlying malignancy. Electronically Signed   By: Vinnie Langton M.D.   On: 10/01/2017 09:18    Cardiac Studies   EF 30-35%.    Patient Profile     72 y.o. male with anterior STEMI, decreased EF  Assessment & Plan     1) Continue DAPT and lipid lowering therapy.  High intensity statin given recent MI.  2) acute systolic heart failure: Appears euvolemic.  Change furosemide to oral tomorrow.  3) CRI: Stable.  Baseline creatinine around 1.3.  Watch closely.  4) CODE STATUS: Will change to full code.  He only wants to not have prolonged life support.  If something is reversible, he would be willing to undergo defibrillation or use of ventilator.  It would only be if something was irreversible, that he would not be willing to stay hooked to machines.  His wife is healthcare power of attorney. 5) PAF: On IV heparin.  Depending on echo result, will consider switching to Eliquis.  If he has an akinetic apex and may be at risk for LV thrombus, in the setting of atrial fibrillation, would also consider adding anticoagulation.  Plan on transferring to the floor.  For questions or updates, please contact [place] Please consult [email or web address] for contact info under Cardiology/STEMI.      Signed, Larae Grooms, MD  10/02/2017, 9:48 AM

## 2017-10-02 NOTE — Progress Notes (Signed)
PULMONARY / CRITICAL CARE MEDICINE   Name: [name] [name] MRN: [id number] DOB: 09/14/1945    ADMISSION DATE:  09/27/2017 CONSULTATION DATE: October 01, 2017  REFERRING MD: Gwenlyn Found   CHIEF COMPLAINT: Chest pain and shortness of breath  HISTORY OF PRESENT ILLNESS:   [name] [name] is a 72 year old male with known history of mild to moderate COPD, coming in with ST elevation MI status post drug-eluting stent to his LAD shortly after went to atrial fibrillation with rapid ventricular response converted to sinus rhythm yesterday.  Patient stated that he presented with left shoulder pain neck pain and upper chest pain that was 8-9 out of 10 was diagnosed with ST elevation MI was taking to the Cath Lab where a drug-eluting stent was applied to his LAD.  Patient has an ejection fraction of 30 to 35% stated that his chest pain has completely resolved after the stent but over the last day or 2 he has been having recurrent similar pain but less intense.  Overnight all of a sudden he started having worsening shortness of breath and he could not roll himself in bed due to trouble breathing.  Patient denies any fever no chills no rigors.  Patient had some clear sputum with blood-tinged.  I was called to assist the patient due to his history which patient gives a history of empyema and pneumonia in the past.  Patient states that this is not similar to his previous pneumonias.  Chest x-ray from today showed Multifocal infiltrates.  CT of the chest on July 10 and July 2 showed no signs of pneumonia.  PAST MEDICAL HISTORY :  He  has a past medical history of COPD (chronic obstructive pulmonary disease) ([place]), Hypertension, Low serum testosterone level, Pneumonia (05/2014), Shortness of breath dyspnea, and Urinary frequency.  SUBJECTIVE: Denies Shortness of breath or cough Reports small amount of blood tinged sputum while on heparin gtt Denies fever/chills Reports some mild wheezing On room air, maintaining O2 sats Urine  output 2.9L yesterday with diuresis   VITAL SIGNS: BP 125/64   Pulse 89   Temp 97.6 F (36.4 C) (Oral)   Resp (!) 24   Ht 6' (1.829 m)   Wt 196 lb 9.6 oz (89.2 kg)   SpO2 94%   BMI 26.66 kg/m   HEMODYNAMICS:    VENTILATOR SETTINGS:    INTAKE / OUTPUT: I/O last 3 completed shifts: In: 3550.4 [P.O.:2080; I.V.:1470.4] Out: 4430 [Urine:4430]  PHYSICAL EXAMINATION: General: Chronically ill appearing male, sitting up in chair, on room air, in no acute distress  Neuro: A&O, answers questions, moves all extremities, no focal deficits  HEENT: Atraumatic, normocephalic, No JVD, MM pink/moist  Cardiovascular: RRR, No M/R/G, s1s2  Lungs: [place] to R base, slight wheeze to LUL, even, non-labored, no assessory muscle use  Abdomen: Soft, nontender  Musculoskeletal: No deformities, No edema  Skin: No obvious rashes, lesions, or ulcerations  LABS:  BMET Recent Labs  Lab 09/29/17 0220 10/01/17 0247 10/02/17 0242  NA 139 141 142  K 4.0 4.3 3.8  CL 106 107 105  CO2 25 27 28   BUN 14 18 18   CREATININE 1.30* 1.51* 1.53*  GLUCOSE 114* 114* 114*    Electrolytes Recent Labs  Lab 09/29/17 0220 10/01/17 0247 10/02/17 0242  CALCIUM 8.5* 8.2* 8.1*    CBC Recent Labs  Lab 09/30/17 0653 10/01/17 0247 10/02/17 0242  WBC 13.8* 11.1* 11.9*  HGB 12.4* 12.2* 12.2*  HCT 38.2* 37.9* 37.4*  PLT 177 191 214  Coag's No results for input(s): APTT, INR in the last 168 hours.  Sepsis Markers No results for input(s): LATICACIDVEN, PROCALCITON, O2SATVEN in the last 168 hours.  ABG No results for input(s): PHART, PCO2ART, PO2ART in the last 168 hours.  Liver Enzymes No results for input(s): AST, ALT, ALKPHOS, BILITOT, ALBUMIN in the last 168 hours.  Cardiac Enzymes Recent Labs  Lab 10/01/17 0247 10/01/17 1318 10/01/17 2138  TROPONINI 20.44* 10.85* 10.67*    Glucose No results for input(s): GLUCAP in the last 168 hours.  Imaging No results  found.   DISCUSSION: [name] is a 72 year old male who was admitted 4 days ago with acute ST elevation MI developed H fibrillation with response now back to sinus rhythm patient had a relief of his chest pain after his cardiac cath and stent but now he is having recurrent chest pain and shortness of breath with some EKG changes.  Pneumonia is less likely especially with his chest x-ray worsening overnight and his sudden symptoms that are associated with chest pain patient also does not look toxic or septic looking with the extensive amount of infiltrate on his chest x-ray I would expect him to look much sicker.  Patient white count is also going down which argues against an infection and no other symptoms like fever or chills.  I do suspect that this patient is having acute pulmonary edema from his atrial fibrillation and also from his possible ongoing ischemia.  ASSESSMENT / PLAN: Multifocal infiltrates on CXR (most likely pulmonary edema as compared to ? PNA) Acute pulmonary edema -BMP 903 Acute ST elevation MI Atrial fibrillation with rapid ventricular response now in sinus rhythm Mild to moderate COPD  Plan: -Continue with Xopenex nebs (given his A-fib) -Continue with diuresis, follow BMP with diuresis -Continue amiodarone for rate control, currently in NSR -Cardiology following, appreciate input -EKG with ischemic changes, defer to Cardiology, likely the cause of his pulmonary edema -Continue to hold off on Abx for now given pt not showing any other signs of infection (CXR suggestive of pulmonary edema) -Check Procalcitonin, if elevated will need to consider ABX -F/u CXR today 7/15  FAMILY  - Updates: Pt and his wife updated at bedside 10/02/17.    Darel Hong, AGACNP-BC [facility] 10/02/2017, 9:26 AM

## 2017-10-02 NOTE — Progress Notes (Addendum)
ANTICOAGULATION CONSULT NOTE  Pharmacy Consult for IV heparin Indication: atrial fibrillation  No Known Allergies  Patient Measurements: Height: 6' (182.9 cm) Weight: 196 lb 9.6 oz (89.2 kg) IBW/kg (Calculated) : 77.6 Heparin Dosing Weight: 87.5 kg  Vital Signs: Temp: 97.6 F (36.4 C) (07/15 0807) Temp Source: Oral (07/15 0807) BP: 125/64 (07/15 0809) Pulse Rate: 89 (07/15 0809)  Labs: Recent Labs    09/30/17 0653  10/01/17 0247 10/01/17 0854 10/01/17 1318 10/01/17 2138 10/02/17 0242  HGB 12.4*  --  12.2*  --   --   --  12.2*  HCT 38.2*  --  37.9*  --   --   --  37.4*  PLT 177  --  191  --   --   --  214  HEPARINUNFRC 0.22*   < > 0.50 0.50  --   --  0.44  CREATININE  --   --  1.51*  --   --   --  1.53*  TROPONINI  --   --  20.44*  --  10.85* 10.67*  --    < > = values in this interval not displayed.    Estimated Creatinine Clearance: 47.9 mL/min (A) (by C-G formula based on SCr of 1.53 mg/dL (H)).   Medical History: Past Medical History:  Diagnosis Date  . COPD (chronic obstructive pulmonary disease) ([place])   . Hypertension   . Low serum testosterone level   . Pneumonia 05/2014  . Shortness of breath dyspnea   . Urinary frequency     Medications:  Scheduled:  . amiodarone  400 mg Oral BID  . aspirin EC  81 mg Oral Daily  . carvedilol  6.25 mg Oral BID WC  . docusate sodium  200 mg Oral BID  . furosemide  40 mg Intravenous Q12H  . losartan  25 mg Oral Daily  . rosuvastatin  40 mg Oral Daily  . sodium chloride flush  3 mL Intravenous Q12H  . tamsulosin  0.4 mg Oral Daily  . ticagrelor  90 mg Oral BID   . sodium chloride 10 mL/hr at 10/02/17 0700  . heparin 1,850 Units/hr (10/02/17 0700)     Assessment: 72 yo male with new onset s/p afib.  Pharmacy initiated anticoagulation with IV heparin.  CBC WNL this morning.  Heparin level 0.44 on heparin drip 1850 units/hr. No bleeding noted. Per RN, no issues with heparin infusion. Patient remains in  NSR.  Goal of Therapy:  Heparin level 0.3-0.7 units/ml Monitor platelets by anticoagulation protocol: Yes   Plan:  Continue IV heparin at 1850 units/hr. Daily heparin level and CBC. F/u plans for transition to oral anticoagulation?  Marguerite Olea, [facility] Clinical Pharmacist Phone [phone]  10/02/2017 9:36 AM

## 2017-10-02 NOTE — Progress Notes (Signed)
CARDIAC REHAB PHASE I   PRE:  Rate/Rhythm: 83 SR  BP:  Supine: 108/94  Sitting:   Standing:    SaO2: 95%RA  MODE:  Ambulation: 190 ft   POST:  Rate/Rhythm: 91 SR  BP:  Supine:   Sitting: 100/66  Standing:    SaO2: 99%RA 1420-1524 Pt walked 190 ft on RA with asst x 1. Had already walked twice today and a little more tired this walk. Completed ed. Reviewed NTG use, MI restrictions, reinforced importance of brilinta and gave ex ed.   Graylon Good, RN BSN  10/02/2017 3:09 PM

## 2017-10-03 DIAGNOSIS — J439 Emphysema, unspecified: Secondary | ICD-10-CM

## 2017-10-03 DIAGNOSIS — Z955 Presence of coronary angioplasty implant and graft: Secondary | ICD-10-CM

## 2017-10-03 DIAGNOSIS — K5900 Constipation, unspecified: Secondary | ICD-10-CM

## 2017-10-03 LAB — HEPARIN LEVEL (UNFRACTIONATED): HEPARIN UNFRACTIONATED: 0.35 [IU]/mL (ref 0.30–0.70)

## 2017-10-03 LAB — BASIC METABOLIC PANEL
Anion gap: 10 (ref 5–15)
BUN: 24 mg/dL — AB (ref 8–23)
CALCIUM: 8.1 mg/dL — AB (ref 8.9–10.3)
CO2: 27 mmol/L (ref 22–32)
CREATININE: 1.66 mg/dL — AB (ref 0.61–1.24)
Chloride: 102 mmol/L (ref 98–111)
GFR calc Af Amer: 46 mL/min — ABNORMAL LOW (ref >60)
GFR, EST NON AFRICAN AMERICAN: 40 mL/min — AB (ref >60)
Glucose, Bld: 118 mg/dL — ABNORMAL HIGH (ref 70–99)
POTASSIUM: 4 mmol/L (ref 3.5–5.1)
SODIUM: 139 mmol/L (ref 135–145)

## 2017-10-03 LAB — CBC
HCT: 36.3 % — ABNORMAL LOW (ref 39.0–52.0)
Hemoglobin: 12 g/dL — ABNORMAL LOW (ref 13.0–17.0)
MCH: 30.5 pg (ref 26.0–34.0)
MCHC: 33.1 g/dL (ref 30.0–36.0)
MCV: 92.1 fL (ref 78.0–100.0)
PLATELETS: 213 10*3/uL (ref 150–400)
RBC: 3.94 MIL/uL — AB (ref 4.22–5.81)
RDW: 13.4 % (ref 11.5–15.5)
WBC: 11 10*3/uL — ABNORMAL HIGH (ref 4.0–10.5)

## 2017-10-03 LAB — PROCALCITONIN: Procalcitonin: <0.1 ng/mL

## 2017-10-03 MED ORDER — TIOTROPIUM BROMIDE MONOHYDRATE 18 MCG IN CAPS
18.0000 ug | ORAL_CAPSULE | Freq: Every day | RESPIRATORY_TRACT | Status: DC
  Administered 2017-10-04: 18 ug via RESPIRATORY_TRACT
  Filled 2017-10-03: qty 5

## 2017-10-03 MED ORDER — CLOPIDOGREL BISULFATE 75 MG PO TABS
75.0000 mg | ORAL_TABLET | Freq: Every day | ORAL | Status: DC
  Administered 2017-10-04: 75 mg via ORAL
  Filled 2017-10-03: qty 1

## 2017-10-03 MED ORDER — LEVALBUTEROL HCL 0.63 MG/3ML IN NEBU: 0.6300 mg | INHALATION_SOLUTION | Freq: Four times a day (QID) | RESPIRATORY_TRACT | Status: DC

## 2017-10-03 MED ORDER — CLOPIDOGREL BISULFATE 75 MG PO TABS
300.0000 mg | ORAL_TABLET | Freq: Once | ORAL | Status: AC
  Administered 2017-10-03: 300 mg via ORAL
  Filled 2017-10-03: qty 4

## 2017-10-03 MED ORDER — SENNOSIDES-DOCUSATE SODIUM 8.6-50 MG PO TABS
1.0000 | ORAL_TABLET | Freq: Two times a day (BID) | ORAL | Status: DC
  Administered 2017-10-03 – 2017-10-04 (×3): 1 via ORAL
  Filled 2017-10-03 (×3): qty 1

## 2017-10-03 MED ORDER — LEVALBUTEROL HCL 0.63 MG/3ML IN NEBU
0.6300 mg | INHALATION_SOLUTION | Freq: Four times a day (QID) | RESPIRATORY_TRACT | Status: DC | PRN
  Administered 2017-10-04: 0.63 mg via RESPIRATORY_TRACT
  Filled 2017-10-03: qty 3

## 2017-10-03 MED ORDER — APIXABAN 5 MG PO TABS
5.0000 mg | ORAL_TABLET | Freq: Two times a day (BID) | ORAL | Status: DC
  Administered 2017-10-03 – 2017-10-04 (×3): 5 mg via ORAL
  Filled 2017-10-03 (×3): qty 1

## 2017-10-03 MED ORDER — CLOPIDOGREL BISULFATE 300 MG PO TABS: 300.0000 mg | ORAL_TABLET | Freq: Once | ORAL | Status: DC

## 2017-10-03 MED ORDER — BUDESONIDE 0.25 MG/2ML IN SUSP: 0.2500 mg | Freq: Two times a day (BID) | RESPIRATORY_TRACT | Status: DC

## 2017-10-03 MED ORDER — IPRATROPIUM BROMIDE 0.02 % IN SOLN: 0.5000 mg | Freq: Four times a day (QID) | RESPIRATORY_TRACT | Status: DC

## 2017-10-03 NOTE — Progress Notes (Signed)
PULMONARY / CRITICAL CARE MEDICINE   Name: [name] [name] MRN: [id number] DOB: [birth year]    ADMISSION DATE:  09/27/2017 CONSULTATION DATE: October 01, 2017  REFERRING MD: Gwenlyn Found   CHIEF COMPLAINT: Chest pain and shortness of breath  BRIEF Mr. [name] [name] is a 72 year old male with known history of mild to moderate COPD, coming in with ST elevation MI status post drug-eluting stent to his LAD shortly after went to atrial fibrillation with rapid ventricular response converted to sinus rhythm yesterday.  Patient stated that he presented with left shoulder pain neck pain and upper chest pain that was 8-9 out of 10 was diagnosed with ST elevation MI was taking to the Cath Lab where a drug-eluting stent was applied to his LAD.  Patient has an ejection fraction of 30 to 35% stated that his chest pain has completely resolved after the stent but over the last day or 2 he has been having recurrent similar pain but less intense.  Overnight all of a sudden he started having worsening shortness of breath and he could not roll himself in bed due to trouble breathing.  Patient denies any fever no chills no rigors.  Patient had some clear sputum with blood-tinged.  I was called to assist the patient due to his history which patient gives a history of empyema and pneumonia in the past.  Patient states that this is not similar to his previous pneumonias.  Chest x-ray from today showed Multifocal infiltrates.  CT of the chest on July 10 and July 2 showed no signs of pneumonia. Results for [name], [name] (MRN [id number]) as of 10/03/2017 09:50  Ref. Range 06/26/2014 17:25 06/28/2014 05:00  pH, Arterial Latest Ref Range: 7.350 - 7.450  7.491 (H) 7.450  pCO2 arterial Latest Ref Range: 35.0 - 45.0 mmHg 29.5 (L) 34.2 (L)  pO2, Arterial Latest Ref Range: 80.0 - 100.0 mmHg 62.3 (L) 70.2 (L)    EVENTs: 7/15 - Denies Shortness of breath or cough Reports small amount of blood tinged sputum while on heparin gtt Denies  fever/chills Reports some mild wheezing On room air, maintaining O2 sats Urine output 2.9L yesterday with diuresis    SUBJECTIVE/OVERNIGHT/INTERVAL HX 7/16 - more worried about constipation. Denies resp issues. Denies fever. At home says was very functional., no on o2 and not on inhalers. PCP Sinda Du, MD in [place]. CT 7/10 visualized -> emphysema wiuth RLL scarring +. No evidence of effusion or infection . AFebirile. Noromal WBC. Normal PCT. Negative Ur strep  VITAL SIGNS: BP 107/84   Pulse 90   Temp 97.6 F (36.4 C) (Oral)   Resp 20   Ht 6' (1.829 m)   Wt 89 kg (196 lb 1.6 oz)   SpO2 95%   BMI 26.60 kg/m   HEMODYNAMICS:    VENTILATOR SETTINGS:    INTAKE / OUTPUT: I/O last 3 completed shifts: In: 2066.2 [P.O.:990; I.V.:1076.2] Out: 4225 [Urine:4225]  PHYSICAL EXAMINATION:  General Appearance:    Looks stable. Deconditioned somewhat. In bed. Pleasant  Head:    Normocephalic, without obvious abnormality, atraumatic  Eyes:    PERRL - yes, conjunctiva/corneas - clear      Ears:    Normal external ear canals, both ears  Nose:   NG tube - no  Throat:  ETT TUBE - no , OG tube - no  Neck:   Supple,  No enlargement/tenderness/nodules     Lungs:     Clear to auscultation bilaterally  Chest wall:    No deformity  Heart:  S1 and S2 normal, no murmur, CVP - no.  Pressors - no  Abdomen:     Soft, no masses, no organomegaly  Genitalia:    Not done  Rectal:   not done  Extremities:   Extremities- intact     Skin:   Intact in exposed areas      Neurologic:   Sedation - none -> RASS - +1 . Moves all 4s - yes. CAM-ICU - nef . Orientation - x3 +      PULMONARY Recent Labs  Lab 09/27/17 0717  TCO2 22    CBC Recent Labs  Lab 10/01/17 0247 10/02/17 0242 10/03/17 0259  HGB 12.2* 12.2* 12.0*  HCT 37.9* 37.4* 36.3*  WBC 11.1* 11.9* 11.0*  PLT 191 214 213    COAGULATION No results for input(s): INR in the last 168 hours.  CARDIAC   Recent Labs   Lab 09/27/17 1602 09/27/17 2227 10/01/17 0247 10/01/17 1318 10/01/17 2138  TROPONINI >65.00* >65.00* 20.44* 10.85* 10.67*   No results for input(s): PROBNP in the last 168 hours.   CHEMISTRY Recent Labs  Lab 09/28/17 0248 09/29/17 0220 10/01/17 0247 10/02/17 0242 10/03/17 0259  NA 137 139 141 142 139  K 4.0 4.0 4.3 3.8 4.0  CL 103 106 107 105 102  CO2 23 25 27 28 27   GLUCOSE 140* 114* 114* 114* 118*  BUN 16 14 18 18  24*  CREATININE 1.36* 1.30* 1.51* 1.53* 1.66*  CALCIUM 8.7* 8.5* 8.2* 8.1* 8.1*   Estimated Creatinine Clearance: 44.1 mL/min (A) (by C-G formula based on SCr of 1.66 mg/dL (H)).   LIVER No results for input(s): AST, ALT, ALKPHOS, BILITOT, PROT, ALBUMIN, INR in the last 168 hours.   INFECTIOUS Recent Labs  Lab 10/02/17 1004 10/03/17 0259  PROCALCITON <0.10 <0.10     ENDOCRINE CBG (last 3)  No results for input(s): GLUCAP in the last 72 hours.       IMAGING x48h  - image(s) personally visualized  -   highlighted in bold Dg Chest Port 1 View  Result Date: 10/02/2017 CLINICAL DATA:  Pulmonary edema.  Follow-up exam. EXAM: PORTABLE CHEST 1 VIEW COMPARISON:  10/01/2017 and older studies. FINDINGS: Hazy opacity noted in the right mid and lower lung is similar to the previous day's study allowing for differences in patient positioning and technique mild left lung base opacity is likely atelectasis. Remainder of the left lung is clear. Small right pleural effusion. No pneumothorax. IMPRESSION: 1. No significant change from the previous day's study allowing for differences in technique. 2. Hazy type airspace opacity in the right mid lower lung suggests multifocal pneumonia. Asymmetric edema is possible. Small right pleural effusion. Electronically Signed   By: Lajean Manes M.D.   On: 10/02/2017 10:19      DISCUSSION: Mr. [name] [name] is a 72 year old male who was admitted 4 days ago with acute ST elevation MI developed H fibrillation with response now  back to sinus rhythm patient had a relief of his chest pain after his cardiac cath and stent but now he is having recurrent chest pain and shortness of breath with some EKG changes.  Pneumonia is less likely especially with his chest x-ray worsening overnight and his sudden symptoms that are associated with chest pain patient also does not look toxic or septic looking with the extensive amount of infiltrate on his chest x-ray I would expect him to look much sicker.  Patient white count is also going down which argues against an infection  and no other symptoms like fever or chills.  I do suspect that this patient is having acute pulmonary edema from his atrial fibrillation and also from his possible ongoing ischemia.  ASSESSMENT / PLAN: Multifocal infiltrates on CXR (most likely pulmonary edema as compared to ? PNA) Acute pulmonary edema -BMP 903 Acute ST elevation MI Atrial fibrillation with rapid ventricular response now in sinus rhythm Mild to moderate COPD    - 7/16- No evidence of pneumonia. His GGO haziness is air traopping v edema. He has copd untreated though prior to admission was handling it wwell without admit  Plan: -Continue with Xopenex nebs (given his A-fib) prn - start spiriva for copd -Diureseis and cards meds per cards - OPD fu with PFT at Sinda Du, MD    CONstipation - unchanged -plan  - increase to senna - documsate  FAMILY  - Updates: Pt and his wife updated at bedside 10/02/17. Both updated 10/03/2017    CCM will sign off  Dr. Brand Males, M.D., [facility]C.P Pulmonary and Critical Care Medicine Staff Physician, [place] Director - Interstitial Lung Disease  Program  Pulmonary [place] at [place], [place], [address]  Pager: [phone], If no answer or between  15:00h - 7:00h: call [phone] Telephone: [phone]

## 2017-10-03 NOTE — Progress Notes (Addendum)
[place] for IV heparin > apixaban Indication: atrial fibrillation  No Known Allergies  Patient Measurements: Height: 6' (182.9 cm) Weight: 196 lb 1.6 oz (89 kg) IBW/kg (Calculated) : 77.6 Heparin Dosing Weight: 87.5 kg  Vital Signs: Temp: 97.6 F (36.4 C) (07/16 0802) Temp Source: Oral (07/16 0802) BP: 107/84 (07/16 0856) Pulse Rate: 90 (07/16 0856)  Labs: Recent Labs    10/01/17 0247 10/01/17 0854 10/01/17 1318 10/01/17 2138 10/02/17 0242 10/03/17 0259  HGB 12.2*  --   --   --  12.2* 12.0*  HCT 37.9*  --   --   --  37.4* 36.3*  PLT 191  --   --   --  214 213  HEPARINUNFRC 0.50 0.50  --   --  0.44 0.35  CREATININE 1.51*  --   --   --  1.53* 1.66*  TROPONINI 20.44*  --  10.85* 10.67*  --   --     Estimated Creatinine Clearance: 44.1 mL/min (A) (by C-G formula based on SCr of 1.66 mg/dL (H)).   Medical History: Past Medical History:  Diagnosis Date  . COPD (chronic obstructive pulmonary disease) ([place])   . Hypertension   . Low serum testosterone level   . Pneumonia 05/2014  . Shortness of breath dyspnea   . Urinary frequency     Medications:  Scheduled:  . amiodarone  400 mg Oral BID  . apixaban  5 mg Oral BID  . aspirin EC  81 mg Oral Daily  . carvedilol  6.25 mg Oral BID WC  . clopidogrel  300 mg Oral Once  . [START ON 10/04/2017] clopidogrel  75 mg Oral Daily  . losartan  25 mg Oral Daily  . rosuvastatin  40 mg Oral Daily  . senna-docusate  1 tablet Oral BID  . sodium chloride flush  3 mL Intravenous Q12H  . tamsulosin  0.4 mg Oral Daily  . tiotropium  18 mcg Inhalation Daily   . sodium chloride 10 mL/hr at 10/03/17 0800     Assessment: 72 yo male s/p DES to LAD with new onset s/p afib> now SR on amiodarone.  Pharmacy initiated anticoagulation with IV heparin.  CBC stable no bleeding  Heparin level 0.35 on heparin drip 1850 units/hr Plan to stop heparin and start apixaban.  Cr > 1.5, age < 92yo wt > 60kg    Goal of Therapy:  Heparin level 0.3-0.7 units/ml Monitor platelets by anticoagulation protocol: Yes   Plan:  Stop heparin drip Apixaban 5mg  BID Monitor s/s bleeding   Bonnita Nasuti Pharm.D. CPP, BCPS Clinical Pharmacist [phone] 10/03/2017 11:24 AM

## 2017-10-03 NOTE — Plan of Care (Signed)
  Problem: Elimination: Goal: Will not experience complications related to urinary retention Outcome: Progressing   Problem: Pain Managment: Goal: General experience of comfort will improve Outcome: Progressing   Problem: Safety: Goal: Ability to remain free from injury will improve Outcome: Progressing   Problem: Skin Integrity: Goal: Risk for impaired skin integrity will decrease Outcome: Progressing   Problem: Education: Goal: Knowledge of disease or condition will improve Outcome: Progressing   Problem: Activity: Goal: Ability to tolerate increased activity will improve Outcome: Progressing   Problem: Cardiac: Goal: Ability to achieve and maintain adequate cardiopulmonary perfusion will improve Outcome: Progressing

## 2017-10-03 NOTE — Care Management Note (Signed)
Case Management Note Marvetta Gibbons RN,BSN Unit [facility] 1-22 Case Manager  [phone]  Patient Details  Name: Render Marley MRN: [id number] Date of Birth: [birth year]  Subjective/Objective:   Pt admitted with STEMI s/p stenting                 Action/Plan: PTA pt lived at home with spouse- independent with ADLs/mobility- anticipate return home. Referral received for Life Vest- spoke with Clair Gulling at [place] on 7/11- order has been received -needed paperwork given to [facility] for approval process. Will plan to fit pt at bedside once approved for LifeVest. Noted pt started on Brilinta- insurance check completed-copay $30/mo- spoke with pt, wife and daughter at bedside coverage info shared on Brilinta.- however pt now in afib today 7/12- unsure if pt will d/c on Brilinta- CM will follow and provide 30 day free card if needed. Pt aware of this and states cost will not be a problem.   Expected Discharge Date:                  Expected Discharge Plan:  Home/Self Care  In-House Referral:     Discharge planning Services  CM Consult, Medication Assistance  Post Acute Care Choice:    Choice offered to:     DME Arranged:  Life vest DME Agency:  [facility]  HH Arranged:    [place] Agency:     Status of Service:  In process, will continue to follow  If discussed at Long Length of Stay Meetings, dates discussed:    Discharge Disposition:   Additional Comments:  10/03/17- 1530- Marvetta Gibbons RN, CM- noted by has been taken off Brilinta- started on Plavix, and will be on Eliquis- will submit insurance check for Eliquis- spoke with pt at bedside- provided pt with 30 day free card for use with Eliquis- pt has Life Vest at the bedside for discharge.   Dawayne Patricia, RN 10/03/2017, 3:49 PM

## 2017-10-03 NOTE — Progress Notes (Signed)
CARDIAC REHAB PHASE I   Offered to walk with pt, pt stated he walked this am already. Reviewed previous education with pt and wife. Questions answered. Encouraged to walk again later today, and take it easy upon d/c. Pt agreeable to CRP II, referral sent to [place]. Will continue to follow.  [id number] Rufina Falco, RN BSN 10/03/2017 10:34 AM

## 2017-10-03 NOTE — Progress Notes (Signed)
Progress Note  Patient Name: [name] [name] Date of Encounter: 10/03/2017  Primary Cardiologist: Carlyle Dolly, MD   Subjective   Feels better.  Bloody phlegm has decreased.  Inpatient Medications    Scheduled Meds: . amiodarone  400 mg Oral BID  . apixaban  5 mg Oral BID  . aspirin EC  81 mg Oral Daily  . carvedilol  6.25 mg Oral BID WC  . clopidogrel  300 mg Oral Once  . [START ON 10/04/2017] clopidogrel  75 mg Oral Daily  . losartan  25 mg Oral Daily  . rosuvastatin  40 mg Oral Daily  . senna-docusate  1 tablet Oral BID  . sodium chloride flush  3 mL Intravenous Q12H  . tamsulosin  0.4 mg Oral Daily  . tiotropium  18 mcg Inhalation Daily   Continuous Infusions: . sodium chloride 10 mL/hr at 10/03/17 0800   PRN Meds: sodium chloride, acetaminophen, ALPRAZolam, levalbuterol, ondansetron (ZOFRAN) IV, sodium chloride flush   Vital Signs    Vitals:   10/03/17 0600 10/03/17 0700 10/03/17 0802 10/03/17 0856  BP: (!) 116/48 135/77  107/84  Pulse: 77 79  90  Resp:      Temp:   97.6 F (36.4 C)   TempSrc:   Oral   SpO2: 97% 95%    Weight: 196 lb 1.6 oz (89 kg)     Height:        Intake/Output Summary (Last 24 hours) at 10/03/2017 1111 Last data filed at 10/03/2017 1000 Gross per 24 hour  Intake 1376.07 ml  Output 2900 ml  Net -1523.93 ml   Filed Weights   10/01/17 0900 10/02/17 0544 10/03/17 0600  Weight: 199 lb 4.8 oz (90.4 kg) 196 lb 9.6 oz (89.2 kg) 196 lb 1.6 oz (89 kg)    Telemetry    NSR - Personally Reviewed  ECG    None recent - Personally Reviewed  Physical Exam   GEN: No acute distress.   Neck: No JVD Cardiac: RRR, no murmurs, rubs, or gallops.  Respiratory: Crackles at right base GI: Soft, nontender, non-distended  MS: No edema; No deformity. Neuro:  Nonfocal  Psych: Normal affect   Labs    Chemistry Recent Labs  Lab 10/01/17 0247 10/02/17 0242 10/03/17 0259  NA 141 142 139  K 4.3 3.8 4.0  CL 107 105 102  CO2 27 28 27     GLUCOSE 114* 114* 118*  BUN 18 18 24*  CREATININE 1.51* 1.53* 1.66*  CALCIUM 8.2* 8.1* 8.1*  GFRNONAA 44* 44* 40*  GFRAA 51* 51* 46*  ANIONGAP 7 9 10      Hematology Recent Labs  Lab 10/01/17 0247 10/02/17 0242 10/03/17 0259  WBC 11.1* 11.9* 11.0*  RBC 4.01* 4.03* 3.94*  HGB 12.2* 12.2* 12.0*  HCT 37.9* 37.4* 36.3*  MCV 94.5 92.8 92.1  MCH 30.4 30.3 30.5  MCHC 32.2 32.6 33.1  RDW 13.9 13.6 13.4  PLT 191 214 213    Cardiac Enzymes Recent Labs  Lab 09/27/17 2227 10/01/17 0247 10/01/17 1318 10/01/17 2138  TROPONINI >65.00* 20.44* 10.85* 10.67*    Recent Labs  Lab 09/27/17 0728  TROPIPOC 0.33*     BNP Recent Labs  Lab 09/29/17 0220 10/01/17 0247  BNP 633.6* 903.1*     DDimer No results for input(s): DDIMER in the last 168 hours.   Radiology    Dg Chest Port 1 View  Result Date: 10/02/2017 CLINICAL DATA:  Pulmonary edema.  Follow-up exam. EXAM: PORTABLE CHEST 1  VIEW COMPARISON:  10/01/2017 and older studies. FINDINGS: Hazy opacity noted in the right mid and lower lung is similar to the previous day's study allowing for differences in patient positioning and technique mild left lung base opacity is likely atelectasis. Remainder of the left lung is clear. Small right pleural effusion. No pneumothorax. IMPRESSION: 1. No significant change from the previous day's study allowing for differences in technique. 2. Hazy type airspace opacity in the right mid lower lung suggests multifocal pneumonia. Asymmetric edema is possible. Small right pleural effusion. Electronically Signed   By: Lajean Manes M.D.   On: 10/02/2017 10:19    Cardiac Studies   EF 30-35%; apical dyskinesis  Patient Profile     72 y.o. male with anterior STEMI  Assessment & Plan    1) CAD: Given that we are adding Eliquis, will change Brilinta to PLavix.  Start with loading dose of Plavix, then 75 mg daily starting tomorrow.  Plan for Lifevest was made last week.  2) PAF: Given the LV  dysfunction and risk of thrombus at the apex, will start Eliquis.  Stop heparin.    3) Acute systolic heart failure: Appears euvolemic. Cr rising.  Stopping Lasix today.  Await Cr in AM.  4) Constipation: Senna started.   For questions or updates, please contact [place] Please consult [email or web address] for contact info under Cardiology/STEMI.      Signed, Larae Grooms, MD  10/03/2017, 11:11 AM

## 2017-10-04 ENCOUNTER — Other Ambulatory Visit: Payer: Self-pay | Admitting: Cardiology

## 2017-10-04 ENCOUNTER — Other Ambulatory Visit: Payer: Self-pay

## 2017-10-04 ENCOUNTER — Encounter (HOSPITAL_COMMUNITY): Payer: Self-pay | Admitting: Cardiology

## 2017-10-04 DIAGNOSIS — Z7901 Long term (current) use of anticoagulants: Secondary | ICD-10-CM

## 2017-10-04 DIAGNOSIS — Z79899 Other long term (current) drug therapy: Secondary | ICD-10-CM

## 2017-10-04 LAB — BASIC METABOLIC PANEL
ANION GAP: 9 (ref 5–15)
BUN: 24 mg/dL — ABNORMAL HIGH (ref 8–23)
CO2: 27 mmol/L (ref 22–32)
Calcium: 8.2 mg/dL — ABNORMAL LOW (ref 8.9–10.3)
Chloride: 101 mmol/L (ref 98–111)
Creatinine, Ser: 1.55 mg/dL — ABNORMAL HIGH (ref 0.61–1.24)
GFR, EST AFRICAN AMERICAN: 50 mL/min — AB (ref >60)
GFR, EST NON AFRICAN AMERICAN: 43 mL/min — AB (ref >60)
Glucose, Bld: 110 mg/dL — ABNORMAL HIGH (ref 70–99)
POTASSIUM: 3.6 mmol/L (ref 3.5–5.1)
Sodium: 137 mmol/L (ref 135–145)

## 2017-10-04 LAB — LEGIONELLA PNEUMOPHILA SEROGP 1 UR AG: L. pneumophila Serogp 1 Ur Ag: NEGATIVE

## 2017-10-04 LAB — CBC
HEMATOCRIT: 35.6 % — AB (ref 39.0–52.0)
Hemoglobin: 11.8 g/dL — ABNORMAL LOW (ref 13.0–17.0)
MCH: 30.4 pg (ref 26.0–34.0)
MCHC: 33.1 g/dL (ref 30.0–36.0)
MCV: 91.8 fL (ref 78.0–100.0)
PLATELETS: 254 10*3/uL (ref 150–400)
RBC: 3.88 MIL/uL — AB (ref 4.22–5.81)
RDW: 13.4 % (ref 11.5–15.5)
WBC: 10.4 10*3/uL (ref 4.0–10.5)

## 2017-10-04 LAB — PROCALCITONIN: Procalcitonin: <0.1 ng/mL

## 2017-10-04 LAB — PHOSPHORUS: PHOSPHORUS: 4.5 mg/dL (ref 2.5–4.6)

## 2017-10-04 LAB — MAGNESIUM: Magnesium: 1.8 mg/dL (ref 1.7–2.4)

## 2017-10-04 MED ORDER — ROSUVASTATIN CALCIUM 40 MG PO TABS: 40.0000 mg | ORAL_TABLET | Freq: Every day | ORAL | 1 refills | Status: DC

## 2017-10-04 MED ORDER — CLOPIDOGREL BISULFATE 75 MG PO TABS: 75.0000 mg | ORAL_TABLET | Freq: Every day | ORAL | 2 refills | Status: DC

## 2017-10-04 MED ORDER — NITROGLYCERIN 0.4 MG SL SUBL: 0.4000 mg | SUBLINGUAL_TABLET | SUBLINGUAL | 2 refills | Status: DC | PRN

## 2017-10-04 MED ORDER — APIXABAN 5 MG PO TABS: 5.0000 mg | ORAL_TABLET | Freq: Two times a day (BID) | ORAL | 2 refills | Status: DC

## 2017-10-04 MED ORDER — FUROSEMIDE 40 MG PO TABS: 40.0000 mg | ORAL_TABLET | Freq: Every day | ORAL | 1 refills | Status: DC

## 2017-10-04 MED ORDER — AMIODARONE HCL 200 MG PO TABS: ORAL_TABLET | ORAL | 1 refills | Status: DC

## 2017-10-04 MED ORDER — TIOTROPIUM BROMIDE MONOHYDRATE 18 MCG IN CAPS: 18.0000 ug | ORAL_CAPSULE | Freq: Every day | RESPIRATORY_TRACT | 1 refills | Status: DC

## 2017-10-04 MED ORDER — LOSARTAN POTASSIUM 25 MG PO TABS: 25.0000 mg | ORAL_TABLET | Freq: Every day | ORAL | 2 refills | Status: DC

## 2017-10-04 MED ORDER — CARVEDILOL 6.25 MG PO TABS: 6.2500 mg | ORAL_TABLET | Freq: Two times a day (BID) | ORAL | 2 refills | Status: DC

## 2017-10-04 NOTE — Progress Notes (Signed)
IV discontinued ,catheter intact. Patient discharged with instructions given on medications,and follow up visits,patient verbalized understanding.Prescriptions sent to Pharmacy of choice documented on AVS. Staff to accompany patient to awaiting vehicle.

## 2017-10-04 NOTE — Progress Notes (Signed)
Per insurance check on Eliquis  Co-pay for Eliquis 5mg  bid is : $ 20.00.per 30 day supply  Mail order $13.00 for 30 day supply , 90 day supply $40.00 ([facility] [phone].)  No PA required  Pharm[facility] : [facility], etc.

## 2017-10-04 NOTE — Discharge Summary (Addendum)
Discharge Summary    Patient ID: [name] [name],  MRN: [id number], DOB/AGE: May 17, 1945 72 y.o.  Admit date: 09/27/2017 Discharge date: 10/04/2017  Primary Care Provider: Sinda [name] Primary Cardiologist: Dr. Harl [name]  Discharge Diagnoses    Active Problems:   ST elevation myocardial infarction (STEMI) [name])   Acute pulmonary edema (HCC)   Acute systolic heart failure (HCC)   PAF (paroxysmal atrial fibrillation) (HCC)   Shortness of breath   Status post coronary artery stent placement   Allergies No Known Allergies  Diagnostic Studies/Procedures    Cath: 09/27/17   Prox RCA lesion is 30% stenosed.  Prox LAD lesion is 100% stenosed.  Prox Cx lesion is 20% stenosed.  A drug-eluting stent was successfully placed using a STENT SYNERGY DES 3X16.  Post intervention, there is a 0% residual stenosis.  Ost 1st Diag lesion is 100% stenosed.  Balloon angioplasty was performed using a BALLOON SAPPHIRE 2.5X12.  Post intervention, there is a 40% residual stenosis.  Prox LAD to Mid LAD lesion is 40% stenosed.   1. Acute anterior STEMI secondary to occlusion of the mid LAD 2. Successful PTCA/DES x 1 mid LAD 3. Successful PTCA/angioplasty ostium of the Diagonal branch.  4. Mild non-obstructive disease in the RCA, circumflex and mid LAD  Recommendations:  Will admit to the ICU and continue Aggrastat for 6 hours. Will start high intensity statin and a beta blocker. Echo later today.   . Recommend uninterrupted dual antiplatelet therapy with Aspirin 81mg  daily and Ticagrelor 90mg  twice daily for a minimum of 12 months (ACS - Class I recommendation).   TTE: 09/27/17  Study Conclusions  - Left ventricle: The cavity size was normal. There was mild focal   basal hypertrophy of the septum. Systolic function was moderately   to severely reduced. The estimated ejection fraction was in the   range of 30% to 35%. Severe hypokinesis of the mid to apical   anteroseptal, anterior,  and anterolateral myocardium. Dyskinetic   at apex. Doppler parameters are consistent with abnormal left   ventricular relaxation (grade 1 diastolic dysfunction). - Aortic valve: Trileaflet; mildly thickened, mildly calcified   leaflets. - Mitral valve: Calcified annulus. There was trivial regurgitation. - Pulmonary arteries: PA peak pressure: 41 mm Hg (S). - Pericardium, extracardiac: A small pericardial effusion was   identified.  Impressions:  - New reduction in LV EF with regional wall motion abnormalities in   the mid to apical anteroseptum, anterior, and anterolateral walls   with dyskinesis at the apex. Small pericardial effusion noted. _____________   History of Present Illness     72 yo male with a history of HTN, COPD, possible prior endocarditis who presented to [name] this am with c/o chest pain. The pain occurred yesterday while exercising but resolved. He woke up the am of admission with chest pain. EKG at [name] with anterior ST elevation c/w an anterior STEMI. Chest CTA at [name] with no evidence of aortic dissection. Pt was given ASA and heparin and transferred to [name] for emergent cardiac catheterization. Upon arrival to [name], he c/o ongoing chest pain.  [name] Course     Consultants: [name]  Underwent cardiac cath noted above with Dr. Angelena [name] with successful PTCA/DES x 1 mid LAD, and successful PTCA/angioplasty ostium of the Diagonal branch.  Also noted mild nonobstructive disease in the RCA.  He was continued on Aggrastat for 6 hours post cath.  Placed on DAPT with aspirin and [name] [name] for at least one  year.  Follow-up echo showed EF of 30 to 35% with severe hypokinesis in the mid to apical anterior septal and anterior lateral myocardium.  Grade 1 diastolic dysfunction with small pericardial effusion noted as well.  Given his decline in his EF he was set up for [name] [name].  Troponin peaked at greater than 653.  He was initially placed on metoprolol, but  this was switched to carvedilol and low-dose losartan added. Home [name] [name] was increased from 5mg  to 40mg  daily. Post cath day 2 he developed A. fib RVR but was asymptomatic.  He was placed on IV amiodarone along with IV heparin.  His beta-blocker dose was increased, and he did eventually convert to sinus rhythm.  His amiodarone was converted to 400 mg twice daily.  Given his need for anticoagulation he was switched from [name] [name] to [name] with plans for triple therapy for 1 month, then DC aspirin after that time.  On 7/14 he developed worsening shortness of breathand [name] [name] was consulted.  He was started on scheduled neb treatments, and was also diuresed with IV [name] [name].  Chest x-ray was not suggestive of signs of infection.  Procalcitonin was checked and negative. Added [name] [name].  He diuresed well was transitioned to oral [name] [name] on 7/16.  He was transitioned off IV heparin to [name] [name] 5 mg twice daily.  Did report some episodes of coughing with blood-tinged sputum, but hemoglobin remained stable.  Creatinine did initially trend up with the addition of [name] [name], but improved to 1.6 on the day of discharge.  In regards to his amiodarone the plan will be 400 mg daily x2 weeks then transition to 200 mg daily.  [name] [name] was adjusted to 40mg  daily at the time of discharge.  Worked well with cardiac rehab without recurrent chest pain. Will recheck [name] [name] on 7/22.   General: Well developed, well nourished, male appearing in no acute distress. Head: Normocephalic, atraumatic.  Neck: Supple without bruits, JVD. Lungs:  Resp regular and unlabored, CTA. Heart: RRR, S1, S2, no S3, S4, or murmur; no rub. Abdomen: Soft, non-tender, non-distended with normoactive bowel sounds. No hepatomegaly. No rebound/guarding. No obvious abdominal masses. Extremities: No clubbing, cyanosis, edema. Distal pedal pulses are 2+ bilaterally. Neuro: Alert and oriented X 3. Moves all extremities spontaneously. Psych: Normal affect.  [name] [name] was  seen by [name] [name] and determined stable for discharge home. Follow up in the office has been arranged. Medications are listed below.   _____________  Discharge Vitals Blood pressure (!) 105/57, pulse 70, temperature 98 F (36.7 C), temperature source Oral, resp. rate 20, height 6' (1.829 m), weight 194 lb 12.8 oz (88.4 kg), SpO2 98 %.  Filed Weights   10/02/17 0544 10/03/17 0600 10/04/17 0438  Weight: 196 lb 9.6 oz (89.2 kg) 196 lb 1.6 oz (89 kg) 194 lb 12.8 oz (88.4 kg)    Labs & Radiologic Studies    CBC Recent Labs    10/03/17 0259 10/04/17 0459  WBC 11.0* 10.4  HGB 12.0* 11.8*  HCT 36.3* 35.6*  MCV 92.1 91.8  PLT 213 778   Basic Metabolic Panel Recent Labs    10/03/17 0259 10/04/17 0459  NA 139 137  K 4.0 3.6  CL 102 101  CO2 27 27  GLUCOSE 118* 110*  BUN 24* 24*  CREATININE 1.66* 1.55*  CALCIUM 8.1* 8.2*  MG  --  1.8  PHOS  --  4.5   Liver Function Tests No results for input(s): AST, ALT, ALKPHOS, BILITOT, PROT, ALBUMIN in the last 72  hours. No results for input(s): LIPASE, AMYLASE in the last 72 hours. Cardiac Enzymes Recent Labs    10/01/17 2138  TROPONINI 10.67*   BNP Invalid input(s): POCBNP D-Dimer No results for input(s): DDIMER in the last 72 hours. Hemoglobin A1C No results for input(s): HGBA1C in the last 72 hours. Fasting Lipid Panel No results for input(s): CHOL, HDL, LDLCALC, TRIG, CHOLHDL, LDLDIRECT in the last 72 hours. Thyroid Function Tests No results for input(s): TSH, T4TOTAL, T3FREE, THYROIDAB in the last 72 hours.  Invalid input(s): FREET3 _____________  Ct Chest Wo Contrast  Result Date: 09/20/2017 CLINICAL DATA:  COPD.  Follow-up pulmonary nodule. EXAM: CT CHEST WITHOUT CONTRAST TECHNIQUE: Multidetector CT imaging of the chest was performed following the standard protocol without IV contrast. COMPARISON:  06/07/2017 chest CT.  07/24/2017 chest radiograph. FINDINGS: Cardiovascular: Normal heart size. No significant  pericardial effusion/thickening. Three-vessel coronary atherosclerosis. Atherosclerotic nonaneurysmal thoracic aorta. Stable prominently dilated main pulmonary artery (4.8 cm diameter). Mediastinum/Nodes: Stable subcentimeter hypodense upper left thyroid lobe nodule. Unremarkable esophagus. No pathologically enlarged axillary, mediastinal or hilar lymph nodes, noting limited sensitivity for the detection of hilar adenopathy on this noncontrast study. Lungs/Pleura: No pneumothorax. No pleural effusion. Mild-to-moderate centrilobular and mild paraseptal emphysema with mild diffuse bronchial wall thickening. No acute consolidative airspace disease or lung masses. Previously visualized patchy consolidation and tree-in-bud opacity in the basilar right upper lobe, right middle lobe, lingula and right lower lobe has resolved, with minimal residual patchy ground-glass attenuation in these locations, compatible with resolving multilobar pneumonia. Several small solid pulmonary nodules scattered in both lungs, largest subpleural 6 mm anterior right middle lobe nodule (series 4/image 105) associated with the minor fissure, all stable since 07/09/2014 chest CT and considered benign. No new significant pulmonary nodules. Stable parenchymal banding at the right lung base compatible with postinfectious/postinflammatory scarring. Upper abdomen: Simple 1.3 cm anterior liver cyst. Musculoskeletal: No aggressive appearing focal osseous lesions. Moderate thoracic spondylosis. IMPRESSION: 1. Near complete resolution of multilobar bronchopneumonia, with minimal residual patchy ground-glass attenuation in the lungs. 2. Stable postinfectious/postinflammatory scarring at the right lung base. No acute pulmonary disease. 3. Mild to moderate emphysema with mild diffuse bronchial wall thickening, compatible with the provided history of COPD. 4. Stable prominently dilated main pulmonary artery, suggesting chronic pulmonary arterial hypertension.  5. Three-vessel coronary atherosclerosis. Aortic Atherosclerosis (ICD10-I70.0) and Emphysema (ICD10-J43.9). Electronically Signed   By: Ilona Sorrel M.D.   On: 09/20/2017 08:45   Dg Chest Port 1 View  Result Date: 10/02/2017 CLINICAL DATA:  Pulmonary edema.  Follow-up exam. EXAM: PORTABLE CHEST 1 VIEW COMPARISON:  10/01/2017 and older studies. FINDINGS: Hazy opacity noted in the right mid and lower lung is similar to the previous day's study allowing for differences in patient positioning and technique mild left lung base opacity is likely atelectasis. Remainder of the left lung is clear. Small right pleural effusion. No pneumothorax. IMPRESSION: 1. No significant change from the previous day's study allowing for differences in technique. 2. Hazy type airspace opacity in the right mid lower lung suggests multifocal pneumonia. Asymmetric edema is possible. Small right pleural effusion. Electronically Signed   By: Lajean Manes M.D.   On: 10/02/2017 10:19   Dg Chest Port 1 View  Result Date: 10/01/2017 CLINICAL DATA:  72 year old male with history of shortness of breath. EXAM: PORTABLE CHEST 1 VIEW COMPARISON:  Chest x-ray 09/28/2017. FINDINGS: Worsening patchy multifocal ill-defined airspace disease throughout the right lung, concerning for progressive multilobar pneumonia. Increasing moderate right pleural effusion. Left lung is  clear. No left pleural effusion. Heart size is normal. No evidence of pulmonary edema. Upper mediastinal contours are within normal limits. IMPRESSION: 1. Worsening multilobar bronchopneumonia in the right lung with increasing right moderate pleural effusion. Followup PA and lateral chest X-ray is recommended in 3-4 weeks following trial of antibiotic therapy to ensure resolution and exclude underlying malignancy. Electronically Signed   By: Vinnie Langton M.D.   On: 10/01/2017 09:18   Dg Chest Port 1 View  Result Date: 09/28/2017 CLINICAL DATA:  LAD STEMI.  Shortness of  breath. EXAM: PORTABLE CHEST 1 VIEW COMPARISON:  CT chest 09/27/2017, 09/19/2017 and earlier. Chest x-rays 07/24/2017, 04/06/2017 and earlier. FINDINGS: Suboptimal inspiration accounts for crowded bronchovascular markings diffusely and atelectasis in the bases, and accentuates the cardiac silhouette. Taking this into account, cardiac silhouette upper normal in size to mildly enlarged, unchanged. Pleuroparenchymal scarring at the RIGHT base with blunting of the costophrenic angle, chronic and unchanged. Lungs otherwise clear. Pulmonary vascularity normal without evidence of pulmonary edema. IMPRESSION: 1. Suboptimal inspiration accounts for mild bibasilar atelectasis. No acute cardiopulmonary disease otherwise. 2. Chronic pleuroparenchymal scarring at the RIGHT base. 3. Borderline heart size without evidence of pulmonary edema. Electronically Signed   By: Evangeline Dakin M.D.   On: 09/28/2017 09:10   Ct Angio Chest/abd/pel For Dissection W And/or Wo Contrast  Result Date: 09/27/2017 CLINICAL DATA:  72 year old male with history of onset left-sided chest pain EXAM: CT ANGIOGRAPHY CHEST, ABDOMEN AND PELVIS TECHNIQUE: Multidetector CT imaging through the chest, abdomen and pelvis was performed using the standard protocol during bolus administration of intravenous contrast. Multiplanar reconstructed images and MIPs were obtained and reviewed to evaluate the vascular anatomy. CONTRAST:  156mL ISOVUE-370 IOPAMIDOL (ISOVUE-370) INJECTION 76% COMPARISON:  09/19/2017 06/15/2017, 10/28/2015, 09/08/2014 FINDINGS: CTA CHEST FINDINGS Cardiovascular: Heart: No cardiomegaly. No pericardial fluid/thickening. Calcifications of left anterior descending, circumflex, right coronary arteries. Aorta: Unremarkable course, caliber, contour of the thoracic aorta. No aneurysm or dissection flap. No periaortic fluid. No intramural hematoma. Branch vessels are patent. Cervical arteries at the base of the neck are patent. Bilateral  subclavian arteries patent. Mild atherosclerosis of the descending thoracic aorta. Pulmonary arteries: Bolus timing is not optimized for evaluation of the pulmonary arteries. Redemonstration of enlarged main pulmonary artery, which is unchanged dating to the most remote CT comparison studies. The distal pulmonary arteries towards the periphery of the lungs are not significantly enlarged. Mediastinum/Nodes: No mediastinal adenopathy. Unremarkable appearance of the thoracic esophagus. Unremarkable appearance of the thoracic inlet. Lungs/Pleura: Central airways are clear. No pleural effusion. No confluent airspace disease. No pneumothorax. Similar appearance of scarring/atelectasis at the dependent right lung greater than left lung. Centrilobular emphysema. Musculoskeletal: No acute displaced fracture. Degenerative changes of the spine. Accentuated kyphotic curvature. No bony canal narrowing. Review of the MIP images confirms the above findings. . CTA ABDOMEN AND PELVIS FINDINGS VASCULAR Aorta: Mild atherosclerotic changes of the abdominal aorta. No dissection. No periaortic fluid. No intramural hematoma or penetrating ulcer. No aneurysm. Celiac: Celiac artery patent, with patent branch vessels. SMA: SMA patent with patent branch vessels. Renals: Renal arteries are patent. Single left and single right renal artery. IMA: IMA patent Right lower extremity: Mild atherosclerotic changes of the right iliac system without stenosis or occlusion. Hypogastric artery is patent. No aneurysm or dissection. Common femoral artery patent. Proximal SFA and profunda femoris patent Left lower extremity: Mild atherosclerotic changes of the left iliac system. No high-grade stenosis or occlusion. No aneurysm or dissection. Hypogastric artery is patent. Common femoral artery  patent. Proximal SFA and profunda femoris patent Veins: Unremarkable appearance of the venous system. Review of the MIP images confirms the above findings. NON-VASCULAR  Hepatobiliary: Unremarkable appearance of the liver. Unremarkable gall bladder. Pancreas: Unremarkable pancreas Spleen: Unremarkable spleen Adrenals/Urinary Tract: Unremarkable adrenal glands Right: No hydronephrosis. Symmetric perfusion to the left. No nephrolithiasis. Unremarkable course of the right ureter. Left: No hydronephrosis. Symmetric perfusion to the right. No nephrolithiasis. Unremarkable course of the left ureter. Unremarkable appearance of the urinary bladder . Stomach/Bowel: Unremarkable appearance of the stomach. Unremarkable appearance of small bowel. No evidence of obstruction. Colonic diverticula. No inflammatory changes. Appendix is not visualized, however, no inflammatory changes are present adjacent to the cecum to indicate an appendicitis. Lymphatic: No adenopathy of the abdomen. Small confluent nodules of the right inguinal region soft tissues, associated with the lymph nodes and small veins. Mesenteric: No free fluid or air. No adenopathy. Reproductive: Unremarkable appearance of the pelvic organs. Other: [name] changes along the midline abdomen. Small fat containing umbilical hernia Musculoskeletal: No acute displaced fracture. [name] changes of prior L4-L5 fixation. Degenerative changes of the lumbar spine. Degenerative changes of the bilateral hips. No aggressive bony lesions. IMPRESSION: No CT evidence of acute aortic syndrome. No acute CT finding of the chest abdomen pelvis. Three vessel coronary artery disease and associated aortic atherosclerosis. Aortic Atherosclerosis (ICD10-I70.0). Emphysema (ICD10-J43.9). Scarring/chronic changes at the lung bases. Diverticular disease without evidence of acute diverticulitis. Confluent small nodules in the right inguinal region may represent venous varicosities or small lymph nodes, and should be amenable to direct inspection. Signed, Dulcy Fanny. Dellia Nims, RPVI [name]  Electronically Signed   By: Corrie Mckusick D.O.   On: 09/27/2017 07:54   Disposition   Pt is being discharged home today in good condition.  Follow-up Plans & Appointments    Follow-up Information    Erma Heritage, PA-C Follow up on 10/20/2017.   Specialties:  Physician Assistant, Cardiology Why:  at 1pm for your follow up appt.  Contact information: [place] [address] [phone]        [name] Follow up on 10/09/2017.   Why:  Please go for labs Contact information: [address] [place] [phone]       [name] Du, MD Follow up.   Specialty:  Pulmonary Disease Why:  Follow up appt within the next 3-4 weeks.  Contact information: [place] [address] [phone]        Arnoldo Lenis, MD .   Specialty:  Cardiology Contact information: [address] [place] [address] [phone]          Discharge Instructions    Amb Referral to Cardiac Rehabilitation   Complete by:  As directed    Diagnosis:   Coronary Stents STEMI PTCA     Diet - low sodium heart healthy   Complete by:  As directed    Discharge instructions   Complete by:  As directed    Radial Site Care Refer to this sheet in the next few weeks. These instructions provide you with information on caring for yourself after your procedure. Your caregiver may also give you more specific instructions. Your treatment has been planned according to current medical practices, but problems sometimes occur. Call your caregiver if you have any problems or questions after your procedure. HOME CARE INSTRUCTIONS You may shower the day after the procedure.Remove the bandage (dressing)  and gently wash the site with plain soap and water.Gently pat the site dry.  Do not apply powder or lotion to the site.  Do not submerge the affected site in water for 3 to 5 days.  Inspect the site at least twice  daily.  Do not flex or bend the affected arm for 24 hours.  No lifting over 5 pounds (2.3 kg) for 5 days after your procedure.  Do not drive home if you are discharged the same day of the procedure. Have someone else drive you.  You may drive 24 hours after the procedure unless otherwise instructed by your caregiver.  What to expect: Any bruising will usually fade within 1 to 2 weeks.  Blood that collects in the tissue (hematoma) may be painful to the touch. It should usually decrease in size and tenderness within 1 to 2 weeks.  SEEK IMMEDIATE MEDICAL CARE IF: You have unusual pain at the radial site.  You have redness, warmth, swelling, or pain at the radial site.  You have drainage (other than a small amount of blood on the dressing).  You have chills.  You have a fever or persistent symptoms for more than 72 hours.  You have a fever and your symptoms suddenly get worse.  Your arm becomes pale, cool, tingly, or numb.  You have heavy bleeding from the site. Hold pressure on the site.   You will be on triple therapy with aspirin, [name] and [name] for one month, then plan to stop aspirin. Also you will need to wear your [name] at all times as instructed by the representative. Please call with any questions.   Increase activity slowly   Complete by:  As directed      Discharge Medications     Medication List    STOP taking these medications   amLODipine 5 MG tablet Commonly known as:  NORVASC   HYDROcodone-acetaminophen 10-325 MG tablet Commonly known as:  NORCO   methocarbamol 500 MG tablet Commonly known as:  ROBAXIN   PHYTOSTEROLS PO   testosterone 50 MG/5GM (1%) Gel Commonly known as:  ANDROGEL     TAKE these medications   ACIDOPHILUS PROBIOTIC 10 MG Tabs Take 10 mg by mouth daily.   ALPRAZolam 0.25 MG tablet Commonly known as:  XANAX Take 0.25 mg by mouth at bedtime as needed for sleep.   amiodarone 200 MG tablet Commonly known as:  PACERONE Take 400mg   daily for 2 weeks, then reduce dose to 200mg  daily.   apixaban 5 MG Tabs tablet Commonly known as:  [name] [name] Take 1 tablet (5 mg total) by mouth 2 (two) times daily.   aspirin EC 81 MG tablet Take 81 mg by mouth daily.   CALCIUM CITRATE + D3 PO Take 1 tablet by mouth daily.   carvedilol 6.25 MG tablet Commonly known as:  COREG Take 1 tablet (6.25 mg total) by mouth 2 (two) times daily with a meal.   clopidogrel 75 MG tablet Commonly known as:  [name] [name] Take 1 tablet (75 mg total) by mouth daily. Start taking on:  10/05/2017   furosemide 40 MG tablet Commonly known as:  [name] [name] Take 1 tablet (40 mg total) by mouth daily.   Krill Oil 350 MG Caps Take 350 mg by mouth daily.   losartan 25 MG tablet Commonly known as:  COZAAR Take 1 tablet (25 mg total) by mouth daily. Start taking on:  10/05/2017   multivitamins ther. w/minerals Tabs tablet Take 1 tablet by mouth daily.  SENIOR VITES PO Take 1 tablet by mouth daily.   nitroGLYCERIN 0.4 MG SL tablet Commonly known as:  NITROSTAT Place 1 tablet (0.4 mg total) under the tongue every 5 (five) minutes as needed.   NON FORMULARY Apply 1 application topically daily. Fluc2% Toe Nail Drops for Fungus   rosuvastatin 40 MG tablet Commonly known as:  [name] [name] Take 1 tablet (40 mg total) by mouth daily. Start taking on:  10/05/2017 What changed:    medication strength  how much to take   tamsulosin 0.4 MG Caps capsule Commonly known as:  FLOMAX Take 0.4 mg by mouth daily.   tiotropium 18 MCG inhalation capsule Commonly known as:  [name] [name] Place 1 capsule (18 mcg total) into inhaler and inhale daily. Start taking on:  10/05/2017        Acute coronary syndrome (MI, NSTEMI, STEMI, etc) this admission?: Yes.     AHA/ACC Clinical Performance & Quality Measures: 1. Aspirin prescribed? - Yes 2. ADP Receptor Inhibitor ([name] [name]/Clopidogrel, [name]/Ticagrelor or Effient/Prasugrel) prescribed (includes medically managed patients)?  - Yes 3. Beta Blocker prescribed? - Yes 4. High Intensity Statin (Lipitor 40-80mg  or [name] [name] 20-40mg ) prescribed? - Yes 5. EF assessed during THIS hospitalization? - Yes 6. For EF <40%, was ACEI/ARB prescribed? - Yes 7. For EF <40%, Aldosterone Antagonist (Spironolactone or Eplerenone) prescribed? - Not Applicable (EF >/= 00%) 8. Cardiac Rehab Phase II ordered (Included Medically managed Patients)? - Yes     Outstanding Labs/Studies   [name] [name] 10/09/17, FLP/LFTs in 6 weeks if tolerating statin.   Duration of Discharge Encounter   Greater than 30 minutes including physician time.  Signed, Reino Bellis NP-C 10/04/2017, 2:02 PM   I have examined the patient and reviewed assessment and plan and discussed with patient.  Agree with above as stated.    GEN: Well nourished, well developed, in no acute distress  HEENT: normal  Neck: no JVD, carotid bruits, or masses Cardiac: RRR; no murmurs, rubs, or gallops,no edema  Respiratory:  Basilar crackles to auscultation bilaterally, normal work of breathing GI: soft, nontender, nondistended,  MS: no deformity or atrophy  Skin: warm and dry, no rash Neuro:  Strength and sensation are intact Psych: euthymic mood, full affect  Discharging on triple therapy: aspirin/[name] [name] and [name] [name].  He had PAF and had adyskinetic apex making LV thrombus formation more likely as well.  Decided on anticoagualtion for now.  WOuld stop aspirin in 3 weeks and continue clopidogrel and [name] [name].  He will need electrolytes followed as well.  COntinue therapy for systolic heart failure.  May be able to add aldactone as an outpatient.  He may decrease the frequency of his [name] [name] if he feels dehydrated.  Cr improved today.  I recommended cardiac rehab as well.  He will need f/u echo in 6 weeks.  Life vest for now.  Possible EP referral if EF does not improve on f/u echo.   Larae Grooms

## 2017-10-04 NOTE — Discharge Instructions (Signed)

## 2017-10-04 NOTE — Care Management Important Message (Signed)
Important Message  Patient Details  Name: [name] [name] MRN: [id number] Date of Birth: [birth year]   Medicare Important Message Given:  Yes    Barb Merino [place] 10/04/2017, 2:45 PM

## 2017-10-04 NOTE — Consult Note (Signed)
   [facility] CM Inpatient Consult   10/04/2017  Dorman Calderwood 1945-04-26 [id number]  Patient evaluated for community based chronic disease management services with [place] [facility] as a benefit of patient's Medicare Insurance. Spoke with patient and wife at the bedside to explain [place] [facility] services.  Patient had received EMMI calls but felt that it was robo calls. Patient was getting ready for return home getting dressed and all.  Patient and wife states they would love to have a nurse to visit.  Patient is high risk for unplanned readmission scored at 24 %.  Patient will receive post hospital discharge call and will be evaluated for monthly home visits for assessments and disease process education.  Left contact information and [facility] literature at bedside. Patient states best number to leave a voice mail message is [phone].   Of note, [facility] Care Management services does not replace or interfere with any services that are arranged by inpatient case management or social work.  For additional questions or referrals please contact:     Natividad Brood, RN BSN [facility] Liaison  [phone] business mobile phone Toll free office [phone]

## 2017-10-04 NOTE — Plan of Care (Signed)
°  Problem: Coping: °Goal: Level of anxiety will decrease °Outcome: Progressing °  °

## 2017-10-05 ENCOUNTER — Telehealth: Payer: Self-pay | Admitting: Cardiovascular Disease

## 2017-10-05 NOTE — Telephone Encounter (Addendum)
Called pt, no answer. Left message for pt to return call.   ----- Message from Orinda Kenner sent at 10/04/2017  4:15 PM EDT ----- Regarding: FW: TOC f/u call   ----- Message ----- From: Cheryln Manly, NP Sent: 10/04/2017   2:09 PM To: Bertram Gala Goins Subject: TOC f/u call                                   Hey,   Needs a toc f/u call. Thanks!  Ria Comment

## 2017-10-05 NOTE — Telephone Encounter (Signed)
Not sure why prior authorizations received this regarding "travel insurance", needs to be directed to Dr York Cerise nurse?

## 2017-10-05 NOTE — Telephone Encounter (Signed)
New Message    Patients wife is calling on patients behalf. She states Dr. Burt Knack took the travel insurance form on the 11th when her husband was in the hospital. At this time they have not received the form back. Please call to discuss.

## 2017-10-06 DIAGNOSIS — J449 Chronic obstructive pulmonary disease, unspecified: Secondary | ICD-10-CM | POA: Diagnosis not present

## 2017-10-06 DIAGNOSIS — N183 Chronic kidney disease, stage 3 (moderate): Secondary | ICD-10-CM | POA: Diagnosis not present

## 2017-10-06 DIAGNOSIS — I5020 Unspecified systolic (congestive) heart failure: Secondary | ICD-10-CM | POA: Diagnosis not present

## 2017-10-06 DIAGNOSIS — I2510 Atherosclerotic heart disease of native coronary artery without angina pectoris: Secondary | ICD-10-CM | POA: Diagnosis not present

## 2017-10-07 ENCOUNTER — Encounter: Payer: Self-pay | Admitting: Cardiology

## 2017-10-09 ENCOUNTER — Other Ambulatory Visit (HOSPITAL_COMMUNITY)
Admission: RE | Admit: 2017-10-09 | Discharge: 2017-10-09 | Disposition: A | Discharge status: Home / Self Care | Payer: Medicare Other | Source: Ambulatory Visit | Attending: Cardiology | Admitting: Cardiology

## 2017-10-09 DIAGNOSIS — Z79899 Other long term (current) drug therapy: Secondary | ICD-10-CM | POA: Diagnosis not present

## 2017-10-09 LAB — BASIC METABOLIC PANEL
ANION GAP: 8 (ref 5–15)
BUN: 33 mg/dL — ABNORMAL HIGH (ref 8–23)
CHLORIDE: 101 mmol/L (ref 98–111)
CO2: 28 mmol/L (ref 22–32)
Calcium: 8.7 mg/dL — ABNORMAL LOW (ref 8.9–10.3)
Creatinine, Ser: 1.7 mg/dL — ABNORMAL HIGH (ref 0.61–1.24)
GFR, EST AFRICAN AMERICAN: 45 mL/min — AB (ref >60)
GFR, EST NON AFRICAN AMERICAN: 38 mL/min — AB (ref >60)
Glucose, Bld: 95 mg/dL (ref 70–99)
POTASSIUM: 4.8 mmol/L (ref 3.5–5.1)
SODIUM: 137 mmol/L (ref 135–145)

## 2017-10-10 NOTE — Telephone Encounter (Signed)
MyChart message sent to Dr Harl Bowie (patient's primary cardiologist) 7/20 to fill out paperwork.   To Dr. Nelly Laurence nurse for follow-up.

## 2017-10-11 ENCOUNTER — Telehealth: Payer: Self-pay | Admitting: Cardiology

## 2017-10-11 NOTE — Telephone Encounter (Signed)
Patient retuning [name] [name]'s call

## 2017-10-11 NOTE — Telephone Encounter (Signed)
Pt wanted to pick up for per mychart message in the [place] office - will fax - pt will pickup tomorrow or Friday

## 2017-10-19 ENCOUNTER — Other Ambulatory Visit: Payer: Self-pay

## 2017-10-19 NOTE — Patient Outreach (Signed)
[facility]) Care Management   10/19/2017  [name] [name] December 10, 1945 [id number]  [name] [name] is an 72 y.o. male  Subjective:  Initial assessment complete. [name] [name] was alert and oriented x3. Denied complaints of pain during RN CM visit.  Objective:  BP 116/60 (BP Location: Right Arm, Patient Position: Sitting, Cuff Size: Normal)   Pulse 66   SpO2 96%     Review of Systems  Constitutional: Positive for malaise/fatigue.  HENT: Negative.   Eyes: Negative.   Respiratory: Positive for cough and sputum production.   Cardiovascular: Negative.   Genitourinary: Positive for urgency.       Increased frequency.  Skin: Positive for itching.       Swelling to left hand due to wasp sting.  Neurological: Negative.   Endo/Heme/Allergies: Bruises/bleeds easily.    Physical Exam  Constitutional: He is oriented to person, place, and time. He appears well-developed.  Cardiovascular: Normal rate.  Respiratory: Effort normal and breath sounds normal.  GI: Soft.  Neurological: He is alert and oriented to person, place, and time.  Skin: Skin is warm and dry.  Psychiatric: He has a normal mood and affect. His behavior is normal. Judgment and thought content normal.    Encounter Medications:   Outpatient Encounter Medications as of 10/19/2017  Medication Sig  . ALPRAZolam (XANAX) 0.25 MG tablet Take 0.25 mg by mouth at bedtime as needed for sleep.  Marland Kitchen amiodarone (PACERONE) 200 MG tablet Take '400mg'$  daily for 2 weeks, then reduce dose to '200mg'$  daily.  Marland Kitchen apixaban (ELIQUIS) 5 MG TABS tablet Take 1 tablet (5 mg total) by mouth 2 (two) times daily.  Marland Kitchen aspirin EC 81 MG tablet Take 81 mg by mouth daily.  . Calcium Citrate-Vitamin D (CALCIUM CITRATE + D3 PO) Take 1 tablet by mouth daily.  . carvedilol (COREG) 6.25 MG tablet Take 1 tablet (6.25 mg total) by mouth 2 (two) times daily with a meal.  . clopidogrel (PLAVIX) 75 MG tablet Take 1 tablet (75 mg total) by mouth daily.  .  furosemide (LASIX) 40 MG tablet Take 1 tablet (40 mg total) by mouth daily.  [name] Oil 350 MG CAPS Take 350 mg by mouth daily.  . Lactobacillus (ACIDOPHILUS PROBIOTIC) 10 MG TABS Take 10 mg by mouth daily.   Marland Kitchen losartan (COZAAR) 25 MG tablet Take 1 tablet (25 mg total) by mouth daily.  . Multiple Vitamins-Minerals (MULTIVITAMINS THER. W/MINERALS) TABS tablet Take 1 tablet by mouth daily.  . Multiple Vitamins-Minerals (SENIOR VITES PO) Take 1 tablet by mouth daily.  . nitroGLYCERIN (NITROSTAT) 0.4 MG SL tablet Place 1 tablet (0.4 mg total) under the tongue every 5 (five) minutes as needed.  . NON FORMULARY Apply 1 application topically daily. Fluc2% Toe Nail Drops for Fungus  . rosuvastatin (CRESTOR) 40 MG tablet Take 1 tablet (40 mg total) by mouth daily.  . tamsulosin (FLOMAX) 0.4 MG CAPS capsule Take 0.4 mg by mouth daily.   Marland Kitchen tiotropium (SPIRIVA) 18 MCG inhalation capsule Place 1 capsule (18 mcg total) into inhaler and inhale daily.   No facility-administered encounter medications on file as of 10/19/2017.     Functional Status:   In your present state of health, do you have any difficulty performing the following activities: 10/19/2017 09/27/2017  Hearing? - Y  Comment - right side- hard of hearing- has hearing aid at home  Vision? - N  Difficulty concentrating or making decisions? (No Data) N  Comment Patient reported difficulty concentrating since returning  home. -  Walking or climbing stairs? Y N  Comment - -  Dressing or bathing? N N  Doing errands, shopping? Y -  Some recent data might be hidden    Fall/Depression Screening:    Fall Risk  10/19/2017 02/06/2015 01/08/2015  Falls in the past year? No No No  Risk for fall due to : Other (Comment) - -   PHQ 2/9 Scores 10/19/2017 02/06/2015 01/08/2015 12/11/2014 11/17/2014 08/26/2014 08/05/2014  PHQ - 2 Score 1 0 0 0 0 0 0    Assessment:   RNCM met with [name] [name] for initial home visit. Member's spouse was present during the  assessment.  [name] [name] reported feeling well but "tired." He denied episodes of chest pain or shortness of breath since returning home. He was discharged home with LifeVest. He reported compliance with medications and daily weights. He was knowledgeable of indications for use and denied concerns with medication affordability. He has scheduled the recommended MD follow ups and reported that his spouse is available to provide transportation. No urgent transportation or community resource needs.  Discussed activity restrictions and care plan goals. [name] [name] denied urgent concerns and was agreeable to continued outreach. Contact information provided. Member and spouse encouraged to notify RNCM with questions or concerns as needed.   [facility] CM Care Plan Problem One     Most Recent Value  Care Plan Problem One  Risk for readmission related to Heart Failure  Role Documenting the Problem One  Care Management Mentor for Problem One  Active  THN Long Term Goal   Over the next 90 days patient will not have a hospitalization related to Heart Failure.  THN Long Term Goal Start Date  10/19/17  Interventions for Problem One Long Term Goal  Education provided to patient and spouse regarding Heart Failure Zones. Discussed importance of medication compliance and indications for notifying MD.   THN CM Short Term Goal #1   Over the next 30 days patient will complete all follow up appointments as scheduled.  [facility] CM Short Term Goal #1 Start Date  10/19/17  Interventions for Short Term Goal #1  Confirmed scheduled follow up appointments. Discussed importance of attending appointments as scheduled. Patient reported that his spouse would provided transportation. RN CM provided patient with [place].  [facility] CM Short Term Goal #2   Over the next 30 days patient will weigh himself and record daily readings.  [facility]N CM Short Term Goal #2 Start Date  10/19/17  Interventions for Short Term Goal #2  Confirmed that  member has a working scale in the home. Education provided regarding weight range and indications for notifying MD.       PLAN: RN CM will follow up on next week.   [place] [phone]

## 2017-10-20 ENCOUNTER — Ambulatory Visit (INDEPENDENT_AMBULATORY_CARE_PROVIDER_SITE_OTHER): Payer: Medicare Other | Admitting: Student

## 2017-10-20 ENCOUNTER — Other Ambulatory Visit (HOSPITAL_COMMUNITY)
Admission: RE | Admit: 2017-10-20 | Discharge: 2017-10-20 | Disposition: A | Discharge status: Home / Self Care | Payer: Medicare Other | Source: Ambulatory Visit | Attending: Student | Admitting: Student

## 2017-10-20 ENCOUNTER — Telehealth: Payer: Self-pay | Admitting: *Deleted

## 2017-10-20 ENCOUNTER — Encounter: Payer: Self-pay | Admitting: Student

## 2017-10-20 VITALS — BP 118/62 | HR 68 | Ht 72.0 in | Wt 196.0 lb

## 2017-10-20 DIAGNOSIS — E785 Hyperlipidemia, unspecified: Secondary | ICD-10-CM | POA: Diagnosis not present

## 2017-10-20 DIAGNOSIS — I255 Ischemic cardiomyopathy: Secondary | ICD-10-CM | POA: Diagnosis not present

## 2017-10-20 DIAGNOSIS — I5042 Chronic combined systolic (congestive) and diastolic (congestive) heart failure: Secondary | ICD-10-CM

## 2017-10-20 DIAGNOSIS — I10 Essential (primary) hypertension: Secondary | ICD-10-CM | POA: Diagnosis not present

## 2017-10-20 DIAGNOSIS — I2510 Atherosclerotic heart disease of native coronary artery without angina pectoris: Secondary | ICD-10-CM | POA: Diagnosis not present

## 2017-10-20 DIAGNOSIS — I480 Paroxysmal atrial fibrillation: Secondary | ICD-10-CM | POA: Diagnosis not present

## 2017-10-20 DIAGNOSIS — I213 ST elevation (STEMI) myocardial infarction of unspecified site: Secondary | ICD-10-CM | POA: Diagnosis not present

## 2017-10-20 LAB — BASIC METABOLIC PANEL
ANION GAP: 7 (ref 5–15)
BUN: 24 mg/dL — ABNORMAL HIGH (ref 8–23)
CALCIUM: 8.8 mg/dL — AB (ref 8.9–10.3)
CO2: 29 mmol/L (ref 22–32)
Chloride: 102 mmol/L (ref 98–111)
Creatinine, Ser: 1.58 mg/dL — ABNORMAL HIGH (ref 0.61–1.24)
GFR, EST AFRICAN AMERICAN: 49 mL/min — AB (ref >60)
GFR, EST NON AFRICAN AMERICAN: 42 mL/min — AB (ref >60)
Glucose, Bld: 99 mg/dL (ref 70–99)
POTASSIUM: 4.7 mmol/L (ref 3.5–5.1)
SODIUM: 138 mmol/L (ref 135–145)

## 2017-10-20 MED ORDER — AMIODARONE HCL 200 MG PO TABS: 200.0000 mg | ORAL_TABLET | Freq: Every day | ORAL | 3 refills | Status: DC

## 2017-10-20 NOTE — Progress Notes (Signed)
Cardiology Office Note    Date:  10/20/2017   ID:  [name] [name], DOB 1945-07-31, MRN [id number]  PCP:  Sinda Du, MD  Cardiologist: Carlyle Dolly, MD    Chief Complaint  Patient presents with  . Hospitalization Follow-up    s/p STEMI    History of Present Illness:    [name] [name] is a 72 y.o. male with past medical history of endocarditis (occurring in 2016), HTN, and HLD who presents to the office today for hospital follow-up.   He recently presented to [facility] ED on 09/27/2017 for evaluation of new onset left shoulder pain which radiated into his left arm and scapular region. Reported having associated discomfort and walking on the treadmill the day prior to admission. His initial EKG showed ST elevation along the anterior leads, therefore Code STEMI was activated and he was transferred to [facility] for an emergent cardiac catheterization. This showed 100% stenosis of the proximal mid LAD and 100% stenosis of ostial first diagonal. He underwent successful PCI/DES placement x1 to the mid LAD with 0% residual stenosis and balloon angioplasty to D1 with a residual 40% stenosis. He was started on DAPT with ASA and Brilinta along with statin therapy. His troponin peaked at greater than 65 and an echocardiogram was performed which showed EF was reduced to 30 to 35% with severe hypokinesis in the mid to apical anterior septal and anterior lateral myocardium. On 09/29/2017, he was noted to have gone into atrial fibrillation with RVR with heart rate in the 150's. He was overall asymptomatic with this but given his reduced EF, he was initiated on IV Amiodarone. His arrhythmia was thought to be related to his acute MI and he converted back to NSR on 09/30/2017. IV Amiodarone was transitioned to PO and he will be on Amiodarone 400mg  daily for 2 weeks then 200mg  daily. Was started on Eliquis for anticoagulation and Brilinta was switched to Plavix and he was continued on ASA. The plan is for him  to be on triple therapy for 1 month then discontinue ASA. Pulmonology was also consulted during admission given worsening dyspnea and he was continued on Xopenex nebulizers and started on Spiriva as per COPD. He was started on Carvedilol and Losartan due to his cardiomyopathy with a LifeVest having been placed prior to discharge.  Repeat labs were obtained on 10/09/2017 and showed stable electrolytes but creatinine had trended upwards from 1.55 to 1.70. It was therefore recommended he only take Lasix as needed for worsening edema or weight gain.  In talking with the patient today, he reports overall doing very well since his recent hospitalization. He denies any recent chest discomfort and reports his respiratory status has significantly improved. He had been walking for 30-minute intervals at his house without any recurrent anginal symptoms. He denies any recent orthopnea, PND, or lower extremity edema. Does report having a dry cough when lying back in his recliner or when going to sleep at night. He has been following weight on his home scales and this has overall declined since hospital discharge from 194 LBS to 190 LBS. He has been following a low-sodium diet and brings with him today an Nurse, mental health of the amount of sodium he is consuming each day.   Past Medical History:  Diagnosis Date  . Acute systolic heart failure ([place])   . COPD (chronic obstructive pulmonary disease) ([place])   . Hyperlipidemia   . Hypertension   . Low serum testosterone level   . PAF (paroxysmal  atrial fibrillation) ([place])   . Pneumonia 05/2014  . Shortness of breath dyspnea   . STEMI (ST elevation myocardial infarction) ([place])    09/27/17 PCI/DES x1 mLAD, PTCA of the diag branch. EF 30% Lifevest at discharge.   . Urinary frequency     Past Surgical History:  Procedure Laterality Date  . APPENDECTOMY    . COLONOSCOPY  2011   Tubular adenoma per patient  . COLONOSCOPY N/A 03/03/2015   Procedure: COLONOSCOPY;   Surgeon: Danie Binder, MD;  [facility];  Service: Endoscopy;  Laterality: N/A;  1245 - moved to 1:00 - office to notify pt   . CORONARY/GRAFT ACUTE MI REVASCULARIZATION N/A 09/27/2017   Procedure: Coronary/Graft Acute MI Revascularization;  Surgeon: Burnell Blanks, MD;  Location: [place] [facility];  Service: Cardiovascular;  Laterality: N/A;  . EMPYEMA DRAINAGE Right 06/27/2014   Procedure: EMPYEMA DRAINAGE;  Surgeon: Grace Isaac, MD;  Location: [place];  Service: Thoracic;  Laterality: Right;  . HEMORRHOID BANDING N/A 03/03/2015   Procedure: HEMORRHOID BANDING;  Surgeon: Danie Binder, MD;  Location: [facility];  Service: Endoscopy;  Laterality: N/A;  Recomended to see a Psychologist, sport and exercise.  [name]    . INTRAVASCULAR ULTRASOUND/IVUS N/A 09/27/2017   Procedure: Intravascular Ultrasound/IVUS;  Surgeon: Burnell Blanks, MD;  Location: [place] [facility];  Service: Cardiovascular;  Laterality: N/A;  . LEFT HEART CATH AND CORONARY ANGIOGRAPHY N/A 09/27/2017   Procedure: LEFT HEART CATH AND CORONARY ANGIOGRAPHY;  Surgeon: Burnell Blanks, MD;  Location: [place] [facility];  Service: Cardiovascular;  Laterality: N/A;  . TEE WITHOUT CARDIOVERSION N/A 07/14/2014   Procedure: TRANSESOPHAGEAL ECHOCARDIOGRAM (TEE);  Surgeon: Sueanne Margarita, MD;  Location: [facility] ENDOSCOPY;  Service: Cardiovascular;  Laterality: N/A;  . VARICOSE VEIN SURGERY    . VIDEO ASSISTED THORACOSCOPY Right 06/27/2014   Procedure: VIDEO ASSISTED THORACOSCOPY;  Surgeon: Grace Isaac, MD;  Location: [place];  Service: Thoracic;  Laterality: Right;  Marland Kitchen VIDEO BRONCHOSCOPY N/A 06/27/2014   Procedure: VIDEO BRONCHOSCOPY;  Surgeon: Grace Isaac, MD;  Location: [facility] OR;  Service: Thoracic;  Laterality: N/A;    Current Medications: Outpatient Medications Prior to Visit  Medication Sig Dispense Refill  . ALPRAZolam (XANAX) 0.25 MG tablet Take 0.25 mg by mouth at bedtime as needed for sleep.  5  .  apixaban (ELIQUIS) 5 MG TABS tablet Take 1 tablet (5 mg total) by mouth 2 (two) times daily. 60 tablet 2  . aspirin EC 81 MG tablet Take 81 mg by mouth daily.    . Calcium Citrate-Vitamin D (CALCIUM CITRATE + D3 PO) Take 1 tablet by mouth daily.    . carvedilol (COREG) 6.25 MG tablet Take 1 tablet (6.25 mg total) by mouth 2 (two) times daily with a meal. 60 tablet 2  . clopidogrel (PLAVIX) 75 MG tablet Take 1 tablet (75 mg total) by mouth daily. 90 tablet 2  . furosemide (LASIX) 40 MG tablet Take 40 mg by mouth daily as needed for fluid or edema.    [name] Oil 350 MG CAPS Take 350 mg by mouth daily.    . Lactobacillus (ACIDOPHILUS PROBIOTIC) 10 MG TABS Take 10 mg by mouth daily.     Marland Kitchen losartan (COZAAR) 25 MG tablet Take 1 tablet (25 mg total) by mouth daily. 30 tablet 2  . Multiple Vitamins-Minerals (SENIOR VITES PO) Take 1 tablet by mouth daily.    . nitroGLYCERIN (NITROSTAT) 0.4 MG SL tablet  Place 1 tablet (0.4 mg total) under the tongue every 5 (five) minutes as needed. 25 tablet 2  . NON FORMULARY Apply 1 application topically daily. Fluc2% Toe Nail Drops for Fungus    . rosuvastatin (CRESTOR) 40 MG tablet Take 1 tablet (40 mg total) by mouth daily. 90 tablet 1  . tamsulosin (FLOMAX) 0.4 MG CAPS capsule Take 0.4 mg by mouth daily.     Marland Kitchen tiotropium (SPIRIVA) 18 MCG inhalation capsule Place 1 capsule (18 mcg total) into inhaler and inhale daily. 30 capsule 1  . amiodarone (PACERONE) 200 MG tablet Take 400mg  daily for 2 weeks, then reduce dose to 200mg  daily. 60 tablet 1  . furosemide (LASIX) 40 MG tablet Take 1 tablet (40 mg total) by mouth daily. 30 tablet 1  . Multiple Vitamins-Minerals (MULTIVITAMINS THER. W/MINERALS) TABS tablet Take 1 tablet by mouth daily.     No facility-administered medications prior to visit.      Allergies:   Patient has no known allergies.   Social History   Socioeconomic History  . Marital status: Married    Spouse name: Not on file  . Number of children:  Not on file  . Years of education: Not on file  . Highest education level: Not on file  Occupational History  . Not on file  Social Needs  . Financial resource strain: Not on file  . Food insecurity:    Worry: Not on file    Inability: Not on file  . Transportation needs:    Medical: Not on file    Non-medical: Not on file  Tobacco Use  . Smoking status: Former Smoker    Types: Pipe    Last attempt to quit: 06/07/2014    Years since quitting: 3.3  . Smokeless tobacco: Never Used  Substance and Sexual Activity  . Alcohol use: Yes    Alcohol/week: 0.0 oz    Comment: Average 1-2 drinks of beer in the evening  . Drug use: No  . Sexual activity: Not on file  Lifestyle  . Physical activity:    Days per week: Not on file    Minutes per session: Not on file  . Stress: Not on file  Relationships  . Social connections:    Talks on phone: Not on file    Gets together: Not on file    Attends religious service: Not on file    Active member of club or organization: Not on file    Attends meetings of clubs or organizations: Not on file    Relationship status: Not on file  Other Topics Concern  . Not on file  Social History Narrative  . Not on file     Family History:  The patient's family history includes COPD in his mother; Hypertension in his father and mother; Tuberculosis in his father.   Review of Systems:   Please see the history of present illness.     General:  No chills, fever, night sweats or weight changes.  Cardiovascular:  No chest pain, dyspnea on exertion, edema, orthopnea, palpitations, paroxysmal nocturnal dyspnea. Dermatological: No rash, lesions/masses Respiratory: No dyspnea. Positive for dry cough.  Urologic: No hematuria, dysuria Abdominal:   No nausea, vomiting, diarrhea, bright red blood per rectum, melena, or hematemesis Neurologic:  No visual changes, wkns, changes in mental status. All other systems reviewed and are otherwise negative except as noted  above.   Physical Exam:    VS:  BP 118/62   Pulse 68  Ht 6' (1.829 m)   Wt 196 lb (88.9 kg)   SpO2 98%   BMI 26.58 kg/m    General: Well developed, well nourished Caucasian male appearing in no acute distress. Head: Normocephalic, atraumatic, sclera non-icteric, no xanthomas, nares are without discharge.  Neck: No carotid bruits. JVD not elevated.  Lungs: Respirations regular and unlabored, without wheezes or rales.  Heart: Regular rate and rhythm. No S3 or S4.  No murmur, no rubs, or gallops appreciated. Life Vest in place.  Abdomen: Soft, non-tender, non-distended with normoactive bowel sounds. No hepatomegaly. No rebound/guarding. No obvious abdominal masses. Msk:  Strength and tone appear normal for age. No joint deformities or effusions. Extremities: No clubbing or cyanosis. No lower extremity edema.  Distal pedal pulses are 2+ bilaterally. Radial cath site stable without ecchymosis or evidence of a hematoma.  Neuro: Alert and oriented X 3. Moves all extremities spontaneously. No focal deficits noted. Psych:  Responds to questions appropriately with a normal affect. Skin: No rashes or lesions noted  Wt Readings from Last 3 Encounters:  10/20/17 196 lb (88.9 kg)  10/04/17 194 lb 12.8 oz (88.4 kg)  08/02/17 193 lb (87.5 kg)     Studies/Labs Reviewed:   EKG:  EKG is ordered today.  The ekg ordered today demonstrates NSR, HR 64, with TWI along inferior and lateral leads (previous tracing on 10/01/2017 showed ST elevation).   Recent Labs: 10/01/2017: B Natriuretic Peptide 903.1 10/04/2017: Hemoglobin 11.8; Magnesium 1.8; Platelets 254 10/20/2017: BUN 24; Creatinine, Ser 1.58; Potassium 4.7; Sodium 138   Lipid Panel No results found for: CHOL, TRIG, HDL, CHOLHDL, VLDL, LDLCALC, LDLDIRECT  Additional studies/ records that were reviewed today include:   Cardiac Catheterization: 09/27/2017  Prox RCA lesion is 30% stenosed.  Prox LAD lesion is 100% stenosed.  Prox Cx lesion  is 20% stenosed.  A drug-eluting stent was successfully placed using a STENT SYNERGY DES 3X16.  Post intervention, there is a 0% residual stenosis.  Ost 1st Diag lesion is 100% stenosed.  Balloon angioplasty was performed using a BALLOON SAPPHIRE 2.5X12.  Post intervention, there is a 40% residual stenosis.  Prox LAD to Mid LAD lesion is 40% stenosed.   1. Acute anterior STEMI secondary to occlusion of the mid LAD 2. Successful PTCA/DES x 1 mid LAD 3. Successful PTCA/angioplasty ostium of the Diagonal branch.  4. Mild non-obstructive disease in the RCA, circumflex and mid LAD  Recommendations:  Will admit to the ICU and continue Aggrastat for 6 hours. Will start high intensity statin and a beta blocker. Echo later today.   . Recommend uninterrupted dual antiplatelet therapy with Aspirin 81mg  daily and Ticagrelor 90mg  twice daily for a minimum of 12 months (ACS - Class I recommendation).    Echocardiogram: 09/27/2017 Study Conclusions  - Left ventricle: The cavity size was normal. There was mild focal   basal hypertrophy of the septum. Systolic function was moderately   to severely reduced. The estimated ejection fraction was in the   range of 30% to 35%. Severe hypokinesis of the mid to apical   anteroseptal, anterior, and anterolateral myocardium. Dyskinetic   at apex. Doppler parameters are consistent with abnormal left   ventricular relaxation (grade 1 diastolic dysfunction). - Aortic valve: Trileaflet; mildly thickened, mildly calcified   leaflets. - Mitral valve: Calcified annulus. There was trivial regurgitation. - Pulmonary arteries: PA peak pressure: 41 mm Hg (S). - Pericardium, extracardiac: A small pericardial effusion was   identified.  Impressions:  - New reduction  in LV EF with regional wall motion abnormalities in   the mid to apical anteroseptum, anterior, and anterolateral walls   with dyskinesis at the apex. Small pericardial effusion  noted.  Assessment:    1. Coronary artery disease involving native coronary artery of native heart without angina pectoris   2. ST elevation myocardial infarction (STEMI), subsequent episode of care ([place])   3. Chronic combined systolic and diastolic heart failure ([place])   4. Ischemic cardiomyopathy   5. PAF (paroxysmal atrial fibrillation) ([place])   6. Hyperlipidemia LDL goal <70   7. Essential hypertension      Plan:   In order of problems listed above:  1. CAD/ Subsequent Episode of Care for STEMI - recently admitted for an Anterior STEMI and cath showed 100% stenosis of the proximal-mid LAD and 100% stenosis of ostial first diagonal. He underwent successful PCI/DES placement x1 to the mid LAD with 0% residual stenosis and balloon angioplasty to D1 with a residual 40% stenosis. - He reports overall progressing well since his recent hospitalization and denies any repeat episodes of chest discomfort. His respiratory status and fatigue continue to improve. He has been walking for over 30 minutes per day without any recurrent anginal symptoms.  - Continue on triple therapy with Eliquis, ASA, and Plavix at this time. Will plan to stop ASA next week as he will be a month out from stent placement. Continue Eliquis and Plavix along with BB and statin therapy. A referral to Cardiac Rehab here at [facility] has been entered.  2. Chronic Combined Systolic and Diastolic CHF/ Ischemic Cardiomyopathy - echocardiogram during recent admission showed EF was reduced to 30 to 35% with severe hypokinesis in the mid to apical anterior septal and anterior lateral myocardium. Will plan for a repeat echocardiogram once 6 weeks out from his MI as outlined by the Interventional Team during his admission. Life Vest remains in place at this time. If EF remains reduced, will need referral to EP for consideration of ICD placement following optimization of his medication regimen.  - He denies any specific dyspnea on  exertion, orthopnea, or lower extremity edema. Following a low-sodium diet. Appears euvolemic by examination today. Remains on Lasix 40 mg as needed. - Continue Carvedilol 6.25 mg twice daily and Losartan 25 mg daily. Recheck BMET today as creatinine was elevated to 1.70 when checked on 7/22. Would plan to titrate Coreg to 12.5mg  BID at his next visit and the possible addition of Spironolactone pending echo results and creatinine trend.   3. Paroxysmal Atrial Fibrillation - Diagnosed during his recent admission which was thought to be related to his acute MI.  He was started on IV Amiodarone and converted back to normal sinus rhythm within a day. He has remained on Amiodarone 400 mg daily for 2 weeks and reduced this to 200 mg daily last week. Will continue on 200 mg daily at this time with plans to stop Amiodarone at his follow-up visit if no recurrence of his arrhythmia. - He denies any evidence of active bleeding. Remains on Eliquis 5 mg twice daily for anticoagulation.  4. HLD - Goal LDL is less than 70 in the setting of known CAD. Crestor was titrated to 40 mg daily during his recent admission.  Will need repeat FLP and LFTs in 6 to 8 weeks.  5. HTN - BP is well controlled at 118/62 during today's visit. He reports similar readings when checked at home.  Continue current medication regimen at this time. Will  recheck BMET given the recent initiation of Losartan.    Medication Adjustments/Labs and Tests Ordered: Current medicines are reviewed at length with the patient today.  Concerns regarding medicines are outlined above.  Medication changes, Labs and Tests ordered today are listed in the Patient Instructions below. Patient Instructions    Medication Instructions:  Your physician has recommended you make the following change in your medication:  Stop taking Aspirin on August 7  Take Amiodarone 200 mg Daily    Labwork: NONE   Testing/Procedures: Your physician has requested that you  have an echocardiogram. Echocardiography is a painless test that uses sound waves to create images of your heart. It provides your doctor with information about the size and shape of your heart and how well your heart's chambers and valves are working. This procedure takes approximately one hour. There are no restrictions for this procedure.    Follow-Up: Your physician recommends that you schedule a follow-up appointment in: 6 Weeks    Any Other Special Instructions Will Be Listed Below (If Applicable).     If you need a refill on your cardiac medications before your next appointment, please call your pharmacy. Thank you for choosing [facility]!   Signed, Erma Heritage, PA-C  10/20/2017 4:21 PM    [place] [address] [place], [place] [address] Phone: [phone]

## 2017-10-20 NOTE — Telephone Encounter (Signed)
Called patient with test results. No answer. Left message to call back.  

## 2017-10-20 NOTE — Telephone Encounter (Signed)
-----   Message from Erma Heritage, [place] sent at 10/20/2017  3:41 PM EDT ----- Please let the patient know that his kidney function has improved as creatinine is now back down to 1.58 which is consistent with values obtained during his recent hospitalization.  Would continue with current medication regimen at this time as discussed at his office visit today. Continue with Lasix only as needed. Thank you.

## 2017-10-20 NOTE — Patient Instructions (Addendum)
Two Gram Sodium Diet 2000 mg  Medication Instructions:  Your physician has recommended you make the following change in your medication:  Stop taking Aspirin on August 7  Take Amiodarone 200 mg Daily    Labwork: NONE   Testing/Procedures: Your physician has requested that you have an echocardiogram. Echocardiography is a painless test that uses sound waves to create images of your heart. It provides your doctor with information about the size and shape of your heart and how well your heart's chambers and valves are working. This procedure takes approximately one hour. There are no restrictions for this procedure.    Follow-Up: Your physician recommends that you schedule a follow-up appointment in: 6 Weeks    Any Other Special Instructions Will Be Listed Below (If Applicable).     If you need a refill on your cardiac medications before your next appointment, please call your pharmacy. Thank you for choosing [facility]!     What is Sodium? Sodium is a mineral found naturally in many foods. The most significant source of sodium in the diet is table salt, which is about 40% sodium.  Processed, convenience, and preserved foods also contain a large amount of sodium.  The body needs only 500 mg of sodium daily to function,  A normal diet provides more than enough sodium even if you do not use salt.  Why Limit Sodium? A build up of sodium in the body can cause thirst, increased blood pressure, shortness of breath, and water retention.  Decreasing sodium in the diet can reduce edema and risk of heart attack or stroke associated with high blood pressure.  Keep in mind that there are many other factors involved in these health problems.  Heredity, obesity, lack of exercise, cigarette smoking, stress and what you eat all play a role.  General Guidelines:  Do not add salt at the table or in cooking.  One teaspoon of salt contains over 2 grams of sodium.  Read food labels  Avoid  processed and convenience foods  Ask your dietitian before eating any foods not dicussed in the menu planning guidelines  Consult your physician if you wish to use a salt substitute or a sodium containing medication such as antacids.  Limit milk and milk products to 16 oz (2 cups) per day.  Shopping Hints:  READ LABELS!! "Dietetic" does not necessarily mean low sodium.  Salt and other sodium ingredients are often added to foods during processing.   Menu Planning Guidelines Food Group Choose More Often Avoid  Beverages (see also the milk group All fruit juices, low-sodium, salt-free vegetables juices, low-sodium carbonated beverages Regular vegetable or tomato juices, commercially softened water used for drinking or cooking  Breads and Cereals Enriched white, wheat, rye and pumpernickel bread, hard rolls and dinner rolls; muffins, cornbread and waffles; most dry cereals, cooked cereal without added salt; unsalted crackers and breadsticks; low sodium or homemade bread crumbs Bread, rolls and crackers with salted tops; quick breads; instant hot cereals; pancakes; commercial bread stuffing; self-rising flower and biscuit mixes; regular bread crumbs or cracker crumbs  Desserts and Sweets Desserts and sweets mad with mild should be within allowance Instant pudding mixes and cake mixes  Fats Butter or margarine; vegetable oils; unsalted salad dressings, regular salad dressings limited to 1 Tbs; light, sour and heavy cream Regular salad dressings containing bacon fat, bacon bits, and salt pork; snack dips made with instant soup mixes or processed cheese; salted nuts  Fruits Most fresh, frozen and canned  fruits Fruits processed with salt or sodium-containing ingredient (some dried fruits are processed with sodium sulfites        Vegetables Fresh, frozen vegetables and low- sodium canned vegetables Regular canned vegetables, sauerkraut, pickled vegetables, and others prepared in brine; frozen  vegetables in sauces; vegetables seasoned with ham, bacon or salt pork  Condiments, Sauces, Miscellaneous  Salt substitute with physician's approval; pepper, herbs, spices; vinegar, lemon or lime juice; hot pepper sauce; garlic powder, onion powder, low sodium soy sauce (1 Tbs.); low sodium condiments (ketchup, chili sauce, mustard) in limited amounts (1 tsp.) fresh ground horseradish; unsalted tortilla chips, pretzels, potato chips, popcorn, salsa (1/4 cup) Any seasoning made with salt including garlic salt, celery salt, onion salt, and seasoned salt; sea salt, rock salt, kosher salt; meat tenderizers; monosodium glutamate; mustard, regular soy sauce, barbecue, sauce, chili sauce, teriyaki sauce, steak sauce, Worcestershire sauce, and most flavored vinegars; canned gravy and mixes; regular condiments; salted snack foods, olives, picles, relish, horseradish sauce, catsup   Food preparation: Try these seasonings Meats:    Pork Sage, onion Serve with applesauce  Chicken Poultry seasoning, thyme, parsley Serve with cranberry sauce  Lamb Curry powder, rosemary, garlic, thyme Serve with mint sauce or jelly  Veal Marjoram, basil Serve with current jelly, cranberry sauce  Beef Pepper, bay leaf Serve with dry mustard, unsalted chive butter  Fish Bay leaf, dill Serve with unsalted lemon butter, unsalted parsley butter  Vegetables:    Asparagus Lemon juice   Broccoli Lemon juice   Carrots Mustard dressing parsley, mint, nutmeg, glazed with unsalted butter and sugar   Green beans Marjoram, lemon juice, nutmeg,dill seed   Tomatoes Basil, marjoram, onion   Spice /blend for [employer]" 4 tsp ground thyme 1 tsp ground sage 3 tsp ground rosemary 4 tsp ground marjoram   Test your knowledge 1. A product that says "Salt Free" may still contain sodium. True or False 2. Garlic Powder and Hot Pepper Sauce an be used as alternative seasonings.True or False 3. Processed foods have more sodium than fresh foods.   True or False 4. Canned Vegetables have less sodium than froze True or False  WAYS TO DECREASE YOUR SODIUM INTAKE 1. Avoid the use of added salt in cooking and at the table.  Table salt (and other prepared seasonings which contain salt) is probably one of the greatest sources of sodium in the diet.  Unsalted foods can gain flavor from the sweet, sour, and butter taste sensations of herbs and spices.  Instead of using salt for seasoning, try the following seasonings with the foods listed.  Remember: how you use them to enhance natural food flavors is limited only by your creativity... Allspice-Meat, fish, eggs, fruit, peas, red and yellow vegetables Almond Extract-Fruit baked goods Anise Seed-Sweet breads, fruit, carrots, beets, cottage cheese, cookies (tastes like licorice) Basil-Meat, fish, eggs, vegetables, rice, vegetables salads, soups, sauces Bay Leaf-Meat, fish, stews, poultry Burnet-Salad, vegetables (cucumber-like flavor) Caraway Seed-Bread, cookies, cottage cheese, meat, vegetables, cheese, rice Cardamon-Baked goods, fruit, soups Celery Powder or seed-Salads, salad dressings, sauces, meatloaf, soup, bread.Do not use  celery salt Chervil-Meats, salads, fish, eggs, vegetables, cottage cheese (parsley-like flavor) Chili Power-Meatloaf, chicken cheese, corn, eggplant, egg dishes Chives-Salads cottage cheese, egg dishes, soups, vegetables, sauces Cilantro-Salsa, casseroles Cinnamon-Baked goods, fruit, pork, lamb, chicken, carrots Cloves-Fruit, baked goods, fish, pot roast, green beans, beets, carrots Coriander-Pastry, cookies, meat, salads, cheese (lemon-orange flavor) Cumin-Meatloaf, fish,cheese, eggs, cabbage,fruit pie (caraway flavor) [employer], fruit, eggs, fish, poultry, cottage cheese, vegetables Kizzie Furnish  Seed-Meat, cottage cheese, poultry, vegetables, fish, salads, bread Fennel Seed-Bread, cookies, apples, pork, eggs, fish, beets, cabbage, cheese, Licorice-like  flavor Garlic-(buds or powder) Salads, meat, poultry, fish, bread, butter, vegetables, potatoes.Do not  use garlic salt Ginger-Fruit, vegetables, baked goods, meat, fish, poultry Horseradish Root-Meet, vegetables, butter Lemon Juice or Extract-Vegetables, fruit, tea, baked goods, fish salads Mace-Baked goods fruit, vegetables, fish, poultry (taste like nutmeg) Maple Extract-Syrups Marjoram-Meat, chicken, fish, vegetables, breads, green salads (taste like Sage) Mint-Tea, lamb, sherbet, vegetables, desserts, carrots, cabbage Mustard, Dry or Seed-Cheese, eggs, meats, vegetables, poultry Nutmeg-Baked goods, fruit, chicken, eggs, vegetables, desserts Onion Powder-Meat, fish, poultry, vegetables, cheese, eggs, bread, rice salads (Do not use   Onion salt) Orange Extract-Desserts, baked goods Oregano-Pasta, eggs, cheese, onions, pork, lamb, fish, chicken, vegetables, green salads Paprika-Meat, fish, poultry, eggs, cheese, vegetables Parsley Flakes-Butter, vegetables, meat fish, poultry, eggs, bread, salads (certain forms may   Contain sodium Pepper-Meat fish, poultry, vegetables, eggs Peppermint Extract-Desserts, baked goods Poppy Seed-Eggs, bread, cheese, fruit dressings, baked goods, noodles, vegetables, cottage  [employer], poultry, meat, fish, cauliflower, turnips,eggs bread Saffron-Rice, bread, veal, chicken, fish, eggs Sage-Meat, fish, poultry, onions, eggplant, tomateos, pork, stews Savory-Eggs, salads, poultry, meat, rice, vegetables, soups, pork Tarragon-Meat, poultry, fish, eggs, butter, vegetables (licorice-like flavor)  Thyme-Meat, poultry, fish, eggs, vegetables, (clover-like flavor), sauces, soups Tumeric-Salads, butter, eggs, fish, rice, vegetables (saffron-like flavor) Vanilla Extract-Baked goods, candy Vinegar-Salads, vegetables, meat marinades Walnut Extract-baked goods, candy  2. Choose your Foods Wisely   The following is a list  of foods to avoid which are high in sodium:  Meats-Avoid all smoked, canned, salt cured, dried and kosher meat and fish as well as Anchovies   [employer] meats:Bologna, Liverwurst, Pastrami Canned meat or fish  Marinated herring Caviar    Pepperoni Corned Beef   Pizza Dried chipped beef  Salami Frozen breaded fish or meat Salt pork Frankfurters or hot dogs  Sardines Gefilte fish   Sausage Ham (boiled ham, Proscuitto Smoked butt    spiced ham)   Spam      TV Dinners Vegetables Canned vegetables (Regular) Relish Canned mushrooms  Sauerkraut Olives    Tomato juice Pickles  Bakery and Dessert Products Canned puddings  Cream pies Cheesecake   Decorated cakes Cookies  Beverages/Juices Tomato juice, regular  Gatorade   V-8 vegetable juice, regular  Breads and Cereals Biscuit mixes   Salted potato chips, corn chips, pretzels Bread stuffing mixes  Salted crackers and rolls Pancake and waffle mixes Self-rising flour  Seasonings Accent    Meat sauces Barbecue sauce  Meat tenderizer Catsup    Monosodium glutamate (MSG) Celery salt   Onion salt Chili sauce   Prepared mustard Garlic salt   Salt, seasoned salt, sea salt Gravy mixes   Soy sauce Horseradish   Steak sauce Ketchup   Tartar sauce Lite salt    Teriyaki sauce Marinade mixes   Worcestershire sauce  Others Baking powder   Cocoa and cocoa mixes Baking soda   Commercial casserole mixes Candy-caramels, chocolate  Dehydrated soups    Bars, fudge,nougats  Instant rice and pasta mixes Canned broth or soup  Maraschino cherries Cheese, aged and processed cheese and cheese spreads  Learning Assessment Quiz  Indicated T (for True) or F (for False) for each of the following statements:  1. _____ Fresh fruits and vegetables and unprocessed grains are generally low in sodium 2. _____ Water may contain a considerable amount of sodium, depending on the source 3. _____ Dennis Bast  can always tell if a food is high in sodium  by tasting it 4. _____ Certain laxatives my be high in sodium and should be avoided unless prescribed   by a physician or pharmacist 5. _____ Salt substitutes may be used freely by anyone on a sodium restricted diet 6. _____ Sodium is present in table salt, food additives and as a natural component of   most foods 7. _____ Table salt is approximately 90% sodium 8. _____ Limiting sodium intake may help prevent excess fluid accumulation in the body 9. _____ On a sodium-restricted diet, seasonings such as bouillon soy sauce, and    cooking wine should be used in place of table salt 10. _____ On an ingredient list, a product which lists monosodium glutamate as the first   ingredient is an appropriate food to include on a low sodium diet  Circle the best answer(s) to the following statements (Hint: there may be more than one correct answer)  11. On a low-sodium diet, some acceptable snack items are:    A. Olives  F. Bean dip   K. Grapefruit juice    B. Salted Pretzels G. Commercial Popcorn   L. Canned peaches    C. Carrot Sticks  H. Bouillon   M. Unsalted nuts   D. [place] fries  I. Peanut butter crackers N. Salami   E. Sweet pickles J. Tomato Juice   O. Pizza  12.  Seasonings that may be used freely on a reduced - sodium diet include   A. Lemon wedges F.Monosodium glutamate K. Celery seed    B.Soysauce   G. Pepper   L. Mustard powder   C. Sea salt  H. Cooking wine  M. Onion flakes   D. Vinegar  E. Prepared horseradish N. Salsa   E. Sage   J. Worcestershire sauce  O. Chutney

## 2017-10-26 ENCOUNTER — Other Ambulatory Visit: Payer: Self-pay

## 2017-10-26 NOTE — Patient Outreach (Addendum)
[place] [facility]) Care Management  10/26/2017  [name] [name] Feb 26, 1946 [id number]   Outreach call complete. [name] [name] reported feeling well today. Reported increased energy level and activity tolerance. Member reported that he was evaluated by Cardiology on last week and approved to begin light exercises. Reports going to the [facility] and tolerating slow walks on the treadmill. [name] [name] denied complaints of chest pain or shortness of breath.   PLAN Will follow up on next week.  [place] [phone]

## 2017-11-01 ENCOUNTER — Telehealth: Payer: Self-pay | Admitting: Cardiology

## 2017-11-01 NOTE — Telephone Encounter (Signed)
Called Felicia from [facility], no answer. Left message for her to return call.

## 2017-11-01 NOTE — Telephone Encounter (Signed)
Please give Felicia w/ [facility] a call concerning this pt's referral to cardiac rehab, [phone]

## 2017-11-02 ENCOUNTER — Other Ambulatory Visit: Payer: Self-pay

## 2017-11-02 NOTE — Telephone Encounter (Signed)
Spoke with Solmon Ice. She was checking on pt's cardiac rehab referral. The order is in and I advised her to contact us on Monday if they had not heard anything.

## 2017-11-02 NOTE — Patient Outreach (Signed)
[facility]) Care Management   11/02/2017  Briana Newman 25-Apr-1945 [id number]  Jawara Latorre is an 72 y.o. male  Subjective:  RNCM in for routine home visit. Mr. Bensinger was alert and oriented x 3. Denied complaints of shortness of breath or discomfort.   Objective:   BP 118/60 (BP Location: Right Arm, Patient Position: Sitting, Cuff Size: Normal)   Pulse 67   Resp 20   SpO2 96%   Review of Systems  Constitutional: Positive for malaise/fatigue.       Mild Fatigue  HENT: Positive for tinnitus.        Wears hearing device.  Eyes: Negative.   Respiratory: Positive for cough and sputum production.        Reported "clear to yellow" sputum.  Cardiovascular: Negative.   Gastrointestinal: Negative.   Genitourinary: Negative.   Musculoskeletal: Negative.   Neurological: Negative for dizziness and headaches.  Psychiatric/Behavioral: Negative.     Physical Exam  Constitutional: He is oriented to person, place, and time. He appears well-developed and well-nourished.  Cardiovascular: Normal rate.  Respiratory: Effort normal and breath sounds normal.  GI: Soft. Bowel sounds are normal.  Neurological: He is alert and oriented to person, place, and time.  Skin: Skin is warm and dry.  Psychiatric: He has a normal mood and affect. His behavior is normal. Judgment and thought content normal.    Encounter Medications:   Outpatient Encounter Medications as of 11/02/2017  Medication Sig Note  . ALPRAZolam (XANAX) 0.25 MG tablet Take 0.25 mg by mouth at bedtime as needed for sleep.   Marland Kitchen amiodarone (PACERONE) 200 MG tablet Take 1 tablet (200 mg total) by mouth daily.   Marland Kitchen apixaban (ELIQUIS) 5 MG TABS tablet Take 1 tablet (5 mg total) by mouth 2 (two) times daily.   Marland Kitchen aspirin EC 81 MG tablet Take 81 mg by mouth daily. 11/02/2017: Discontinued.  . Calcium Citrate-Vitamin D (CALCIUM CITRATE + D3 PO) Take 1 tablet by mouth daily.   . carvedilol (COREG) 6.25 MG tablet Take 1 tablet  (6.25 mg total) by mouth 2 (two) times daily with a meal.   . clopidogrel (PLAVIX) 75 MG tablet Take 1 tablet (75 mg total) by mouth daily.   . furosemide (LASIX) 40 MG tablet Take 40 mg by mouth daily as needed for fluid or edema.   [name] Oil 350 MG CAPS Take 350 mg by mouth daily.   . Lactobacillus (ACIDOPHILUS PROBIOTIC) 10 MG TABS Take 10 mg by mouth daily.    Marland Kitchen losartan (COZAAR) 25 MG tablet Take 1 tablet (25 mg total) by mouth daily.   . Multiple Vitamins-Minerals (SENIOR VITES PO) Take 1 tablet by mouth daily.   . nitroGLYCERIN (NITROSTAT) 0.4 MG SL tablet Place 1 tablet (0.4 mg total) under the tongue every 5 (five) minutes as needed.   . NON FORMULARY Apply 1 application topically daily. Fluc2% Toe Nail Drops for Fungus   . rosuvastatin (CRESTOR) 40 MG tablet Take 1 tablet (40 mg total) by mouth daily.   . tamsulosin (FLOMAX) 0.4 MG CAPS capsule Take 0.4 mg by mouth daily.    Marland Kitchen tiotropium (SPIRIVA) 18 MCG inhalation capsule Place 1 capsule (18 mcg total) into inhaler and inhale daily.    No facility-administered encounter medications on file as of 11/02/2017.     Functional Status:   In your present state of health, do you have any difficulty performing the following activities: 11/02/2017 10/19/2017  Hearing? Y -  Comment - -  Vision? - -  Difficulty concentrating or making decisions? - (No Data)  Comment - Patient reported difficulty concentrating since returning home.  Walking or climbing stairs? - Y  Comment - -  Dressing or bathing? - N  Doing errands, shopping? - Y  Some recent data might be hidden    Fall/Depression Screening:    Fall Risk  10/19/2017 02/06/2015 01/08/2015  Falls in the past year? No No No  Risk for fall due to : Other (Comment) - -   PHQ 2/9 Scores 10/19/2017 02/06/2015 01/08/2015 12/11/2014 11/17/2014 08/26/2014 08/05/2014  PHQ - 2 Score 1 0 0 0 0 0 0    Assessment:   Home visit complete. Mr. Tantillo reported feeling well and has transitioned well since  discharge. Still experiencing mild fatigue but no complaints of shortness of breath. He reported taking medications as prescribed and remains compliant with heart healthy diet, sodium restrictions and daily weights. Expressed interest in participating in mild, low impact exercise routines. He performs ADLs and has tolerated activities in the home to include walks around the porch without difficulty. Pending follow up call regarding approximate start date for Cardiac Rehab.  [facility] CM Care Plan Problem One     Most Recent Value  Care Plan Problem One  Risk for readmission related to Heart Failure  Role Documenting the Problem One  Care Management [place] for Problem One  Active  THN Long Term Goal   Over the next 90 days patient will not have a hospitalization related to Heart Failure.  [facility]N Long Term Goal Start Date  10/19/17  Interventions for Problem One Long Term Goal  Reviewed medications and educated patient and spouse on activity restrictions.  [facility] CM Short Term Goal #1   Over the next 30 days patient will complete all follow up appointments as scheduled.  [facility] CM Short Term Goal #1 Start Date  10/19/17  Interventions for Short Term Goal #1  Reviewed pending appointments. Patient reports attending appointments as scheduled. Pending follow call for Cardiac Rehab.  [facility]N CM Short Term Goal #2   Over the next 30 days patient will weigh himself and record daily readings.  THN CM Short Term Goal #2 Start Date  10/19/17  [facility] CM Short Term Goal #2 Met Date  11/02/17  Interventions for Short Term Goal #2  Discussed daily weights. Patient compliant with weight log. Patient's weight has remained within range.      PLAN Will continue routine outreach.  [place] [phone]

## 2017-11-09 ENCOUNTER — Ambulatory Visit (HOSPITAL_COMMUNITY)
Admission: RE | Admit: 2017-11-09 | Discharge: 2017-11-09 | Disposition: A | Discharge status: Home / Self Care | Payer: Medicare Other | Source: Ambulatory Visit | Attending: Student | Admitting: Student

## 2017-11-09 DIAGNOSIS — I313 Pericardial effusion (noninflammatory): Secondary | ICD-10-CM | POA: Diagnosis not present

## 2017-11-09 DIAGNOSIS — Z955 Presence of coronary angioplasty implant and graft: Secondary | ICD-10-CM | POA: Diagnosis not present

## 2017-11-09 DIAGNOSIS — I110 Hypertensive heart disease with heart failure: Secondary | ICD-10-CM | POA: Insufficient documentation

## 2017-11-09 DIAGNOSIS — I5042 Chronic combined systolic (congestive) and diastolic (congestive) heart failure: Secondary | ICD-10-CM | POA: Diagnosis not present

## 2017-11-09 DIAGNOSIS — I480 Paroxysmal atrial fibrillation: Secondary | ICD-10-CM | POA: Insufficient documentation

## 2017-11-09 DIAGNOSIS — E785 Hyperlipidemia, unspecified: Secondary | ICD-10-CM | POA: Insufficient documentation

## 2017-11-09 DIAGNOSIS — J449 Chronic obstructive pulmonary disease, unspecified: Secondary | ICD-10-CM | POA: Insufficient documentation

## 2017-11-09 NOTE — Progress Notes (Signed)
*  PRELIMINARY RESULTS* Echocardiogram 2D Echocardiogram has been performed.  Samuel Germany 11/09/2017, 9:21 AM

## 2017-11-10 ENCOUNTER — Telehealth: Payer: Self-pay | Admitting: Student

## 2017-11-10 NOTE — Telephone Encounter (Signed)
   He was already walking for 30-minute intervals at the time of his last office visit. Can increase this if he wishes to do so. Would focus on walking in the morning or evening hours due to the recent temperatures. He can increase his weight training activities as well. Would start at a lower weight/lower reps and gradually increase over time. If he feels like he is "over doing it", then he needs to decrease this back to a comfortable pace.   Signed, Erma Heritage, PA-C 11/10/2017, 10:05 AM Pager: [phone]

## 2017-11-10 NOTE — Telephone Encounter (Signed)
Pt notified. He voiced understanding.  

## 2017-11-10 NOTE — Telephone Encounter (Signed)
Will forward to [place], [place].

## 2017-11-10 NOTE — Telephone Encounter (Signed)
Patient states that he was given instructions to remove life vest. He wants to know if that means he can come off of exercise restrictions. / tg

## 2017-11-17 ENCOUNTER — Other Ambulatory Visit: Payer: Self-pay

## 2017-11-17 NOTE — Patient Outreach (Signed)
[place] [facility]) Care Management  11/17/2017  [name] [name] 09/05/1945 [id number]   RN CM contacted [name] [name] for routine outreach. Spoke with member's spouse [name] who reported he was at the [place] [facility] at the time of the call. She reported that [name] was doing well and engaging in light exercise routines as directed by Cardiology PA. She reported that Echocardiogram results were received and he is no longer required to use the LifeVest. No urgent questions or concerns at this time.   PLAN RN CM will follow up in two weeks.   [place] [phone]

## 2017-11-29 ENCOUNTER — Encounter (HOSPITAL_COMMUNITY): Payer: Self-pay

## 2017-11-29 ENCOUNTER — Encounter (HOSPITAL_COMMUNITY)
Admission: RE | Admit: 2017-11-29 | Discharge: 2017-11-29 | Disposition: A | Discharge status: Home / Self Care | Payer: Medicare Other | Source: Ambulatory Visit | Attending: Cardiology | Admitting: Cardiology

## 2017-11-29 VITALS — BP 100/52 | HR 58 | Ht 72.0 in | Wt 194.5 lb

## 2017-11-29 DIAGNOSIS — I213 ST elevation (STEMI) myocardial infarction of unspecified site: Secondary | ICD-10-CM | POA: Diagnosis not present

## 2017-11-29 DIAGNOSIS — Z955 Presence of coronary angioplasty implant and graft: Secondary | ICD-10-CM | POA: Insufficient documentation

## 2017-11-29 NOTE — Progress Notes (Signed)
Cardiac/Pulmonary Rehab Medication Review by a Pharmacist  Does the patient  feel that his/her medications are working for him/her?  yes  Has the patient been experiencing any side effects to the medications prescribed?  One side effect he notices is longer bleeding with apixaban and longer to heal. Dizziness sometimes when low BP.  Does the patient measure his/her own blood pressure or blood glucose at home?  No, bought a BP cuff but wasn't working well.   Does the patient have any problems obtaining medications due to transportation or finances?   no  Understanding of regimen: good Understanding of indications: good Potential of compliance: excellent  Questions asked to Determine Patient Understanding of Medication Regimen:  1. What is the name of the medication?  2. What is the medication used for?  3. When should it be taken?  4. How much should be taken?  5. How will you take it?  6. What side effects should you report?  Understanding Defined as: Excellent: All questions above are correct Good: Questions 1-4 are correct Fair: Questions 1-2 are correct  Poor: 1 or none of the above questions are correct   Pharmacist comments: Patient has good understandings of his medications and wife helps him as well. Explained to patient about dizziness with low BP and how to make sure doesn't have any falls.     Ramond Craver 11/29/2017 1:49 PM

## 2017-11-29 NOTE — Progress Notes (Signed)
Cardiac Individual Treatment Plan  Patient Details  Name: [name] [name] MRN: [id number] Date of Birth: 08/05/1945 Referring Provider:     CARDIAC REHAB PHASE II ORIENTATION from 11/29/2017 in [place]  Referring Provider  Branch      Initial Encounter Date:    CARDIAC REHAB PHASE II ORIENTATION from 11/29/2017 in [place]  Date  11/29/17      Visit Diagnosis: ST elevation myocardial infarction (STEMI), unspecified artery [facility])  Status post coronary artery stent placement  Patient's Home Medications on Admission:  Current Outpatient Medications:  .  ALPRAZolam (XANAX) 0.25 MG tablet, Take 0.25 mg by mouth at bedtime as needed for sleep., Disp: , Rfl: 5 .  amiodarone (PACERONE) 200 MG tablet, Take 1 tablet (200 mg total) by mouth daily., Disp: 90 tablet, Rfl: 3 .  apixaban (ELIQUIS) 5 MG TABS tablet, Take 1 tablet (5 mg total) by mouth 2 (two) times daily., Disp: 60 tablet, Rfl: 2 .  aspirin EC 81 MG tablet, Take 81 mg by mouth daily., Disp: , Rfl:  .  Calcium Citrate-Vitamin D (CALCIUM CITRATE + D3 PO), Take 1 tablet by mouth daily., Disp: , Rfl:  .  carvedilol (COREG) 6.25 MG tablet, Take 1 tablet (6.25 mg total) by mouth 2 (two) times daily with a meal., Disp: 60 tablet, Rfl: 2 .  clopidogrel (PLAVIX) 75 MG tablet, Take 1 tablet (75 mg total) by mouth daily., Disp: 90 tablet, Rfl: 2 .  furosemide (LASIX) 40 MG tablet, Take 40 mg by mouth daily as needed for fluid or edema., Disp: , Rfl:  .  Krill Oil 350 MG CAPS, Take 350 mg by mouth daily., Disp: , Rfl:  .  Lactobacillus (ACIDOPHILUS PROBIOTIC) 10 MG TABS, Take 10 mg by mouth daily. , Disp: , Rfl:  .  losartan (COZAAR) 25 MG tablet, Take 1 tablet (25 mg total) by mouth daily., Disp: 30 tablet, Rfl: 2 .  Multiple Vitamins-Minerals (SENIOR VITES PO), Take 1 tablet by mouth daily., Disp: , Rfl:  .  nitroGLYCERIN (NITROSTAT) 0.4 MG SL tablet, Place 1 tablet (0.4 mg total) under the  tongue every 5 (five) minutes as needed., Disp: 25 tablet, Rfl: 2 .  NON FORMULARY, Apply 1 application topically daily. Fluc2% Toe Nail Drops for Fungus, Disp: , Rfl:  .  rosuvastatin (CRESTOR) 40 MG tablet, Take 1 tablet (40 mg total) by mouth daily., Disp: 90 tablet, Rfl: 1 .  tamsulosin (FLOMAX) 0.4 MG CAPS capsule, Take 0.4 mg by mouth daily. , Disp: , Rfl:  .  tiotropium (SPIRIVA) 18 MCG inhalation capsule, Place 1 capsule (18 mcg total) into inhaler and inhale daily., Disp: 30 capsule, Rfl: 1  Past Medical History: Past Medical History:  Diagnosis Date  . Acute systolic heart failure ([place])   . COPD (chronic obstructive pulmonary disease) ([place])   . Hyperlipidemia   . Hypertension   . Low serum testosterone level   . PAF (paroxysmal atrial fibrillation) ([place])   . Pneumonia 05/2014  . Shortness of breath dyspnea   . STEMI (ST elevation myocardial infarction) ([place])    09/27/17 PCI/DES x1 mLAD, PTCA of the diag branch. EF 30% Lifevest at discharge.   . Urinary frequency     Tobacco Use: Social History   Tobacco Use  Smoking Status Former Smoker  . Types: Pipe  . Last attempt to quit: 06/07/2014  . Years since quitting: 3.4  Smokeless Tobacco Never Used    Labs: Recent Review Flowsheet  Data    Labs for ITP Cardiac and Pulmonary Rehab Latest Ref Rng & Units 06/26/2014 06/28/2014 09/27/2017   PHART 7.350 - 7.450 7.491(H) 7.450 -   PCO2ART 35.0 - 45.0 mmHg 29.5(L) 34.2(L) -   HCO3 20.0 - 24.0 mEq/L 22.0 23.4 -   TCO2 22 - 32 mmol/L 22.8 24.5 22   ACIDBASEDEF 0.0 - 2.0 mmol/L 0.7 0.1 -   O2SAT % 91.5 95.6 -      Capillary Blood Glucose: Lab Results  Component Value Date   GLUCAP 114 (H) 06/07/2014     Exercise Target Goals: Exercise Program Goal: Individual exercise prescription set using results from initial 6 min walk test and THRR while considering  patient's activity barriers and safety.   Exercise Prescription Goal: Starting with aerobic activity 30 plus minutes a  day, 3 days per week for initial exercise prescription. Provide home exercise prescription and guidelines that participant acknowledges understanding prior to discharge.  Activity Barriers & Risk Stratification: Activity Barriers & Cardiac Risk Stratification - 11/29/17 1344      Activity Barriers & Cardiac Risk Stratification   Activity Barriers  Back Problems;Shortness of Breath   spinal fusion; COPD    Cardiac Risk Stratification  High       6 Minute Walk: 6 Minute Walk    Row Name 11/29/17 1343         6 Minute Walk   Phase  Initial     Distance  1700 feet     Walk Time  6 minutes     # of Rest Breaks  0     MPH  3.22     METS  3.47     RPE  9     Perceived Dyspnea   11     VO2 Peak  12.1     Symptoms  No     Resting HR  58 bpm     Resting BP  110/52     Resting Oxygen Saturation   97 %     Exercise Oxygen Saturation  during 6 min walk  94 %     Max Ex. HR  84 bpm     Max Ex. BP  126/60     2 Minute Post BP  110/58        Oxygen Initial Assessment:   Oxygen Re-Evaluation:   Oxygen Discharge (Final Oxygen Re-Evaluation):   Initial Exercise Prescription: Initial Exercise Prescription - 11/29/17 1300      Date of Initial Exercise RX and Referring Provider   Date  11/29/17    Referring Provider  Branch      Treadmill   MPH  2.5    Grade  0    Minutes  17    METs  2.91      Recumbant Elliptical   Level  1    RPM  49    Watts  55    Minutes  17    METs  4      Prescription Details   Frequency (times per week)  3    Duration  Progress to 30 minutes of continuous aerobic without signs/symptoms of physical distress      Intensity   THRR 40-80% of Max Heartrate  [phone]    Ratings of Perceived Exertion  11-13    Perceived Dyspnea  0-4      Progression   Progression  Continue to progress workloads to maintain intensity without signs/symptoms of physical distress.  Resistance Training   Training Prescription  Yes    Weight  1    Reps   10-15       Perform Capillary Blood Glucose checks as needed.  Exercise Prescription Changes:   Exercise Comments:   Exercise Goals and Review:  Exercise Goals    Row Name 11/29/17 1436             Exercise Goals   Increase Physical Activity  Yes       Intervention  Develop an individualized exercise prescription for aerobic and resistive training based on initial evaluation findings, risk stratification, comorbidities and participant's personal goals.       Increase Strength and Stamina  Yes       Intervention  Provide advice, education, support and counseling about physical activity/exercise needs.;Develop an individualized exercise prescription for aerobic and resistive training based on initial evaluation findings, risk stratification, comorbidities and participant's personal goals.       Expected Outcomes  Short Term: Increase workloads from initial exercise prescription for resistance, speed, and METs.;Short Term: Perform resistance training exercises routinely during rehab and add in resistance training at home;Long Term: Improve cardiorespiratory fitness, muscular endurance and strength as measured by increased METs and functional capacity (6MWT)       Able to understand and use rate of perceived exertion (RPE) scale  Yes       Intervention  Provide education and explanation on how to use RPE scale       Expected Outcomes  Short Term: Able to use RPE daily in rehab to express subjective intensity level       Knowledge and understanding of Target Heart Rate Range (THRR)  Yes       Intervention  Provide education and explanation of THRR including how the numbers were predicted and where they are located for reference       Expected Outcomes  Short Term: Able to state/look up THRR;Long Term: Able to use THRR to govern intensity when exercising independently       Able to check pulse independently  Yes       Intervention  Provide education and demonstration on how to check pulse  in carotid and radial arteries.       Expected Outcomes  Short Term: Able to explain why pulse checking is important during independent exercise;Long Term: Able to check pulse independently and accurately       Understanding of Exercise Prescription  Yes       Intervention  Provide education, explanation, and written materials on patient's individual exercise prescription       Expected Outcomes  Short Term: Able to explain program exercise prescription;Long Term: Able to explain home exercise prescription to exercise independently          Exercise Goals Re-Evaluation :    Discharge Exercise Prescription (Final Exercise Prescription Changes):   Nutrition:  Target Goals: Understanding of nutrition guidelines, daily intake of sodium 1500mg , cholesterol 200mg , calories 30% from fat and 7% or less from saturated fats, daily to have 5 or more servings of fruits and vegetables.  Biometrics: Pre Biometrics - 11/29/17 1346      Pre Biometrics   Height  6' (1.829 m)    Waist Circumference  36 inches    Hip Circumference  40 inches    Waist to Hip Ratio  0.9 %    BMI (Calculated)  26.37    Triceps Skinfold  5 mm    % Body Fat  20.6 %    Grip Strength  28.6 kg    Flexibility  0 in    Single Leg Stand  5.86 seconds        Nutrition Therapy Plan and Nutrition Goals: Nutrition Therapy & Goals - 11/29/17 1522      Personal Nutrition Goals   Personal Goal #2  Patient eating low sodium, low fat and heart healthy    Additional Goals?  No       Nutrition Assessments: Nutrition Assessments - 11/29/17 1523      MEDFICTS Scores   Pre Score  27       Nutrition Goals Re-Evaluation:   Nutrition Goals Discharge (Final Nutrition Goals Re-Evaluation):   Psychosocial: Target Goals: Acknowledge presence or absence of significant depression and/or stress, maximize coping skills, provide positive support system. Participant is able to verbalize types and ability to use techniques and  skills needed for reducing stress and depression.  Initial Review & Psychosocial Screening: Initial Psych Review & Screening - 11/29/17 1518      Initial Review   Current issues with  Current Depression;Current Stress Concerns    Source of Stress Concerns  Family   His recent health issues     Family Dynamics   Good Support System?  Yes      Barriers   Psychosocial barriers to participate in program  The patient should benefit from training in stress management and relaxation.;Psychosocial barriers identified (see note)      Screening Interventions   Interventions  Encouraged to exercise    Expected Outcomes  Short Term goal: Identification and review with participant of any Quality of Life or Depression concerns found by scoring the questionnaire.;Long Term goal: The participant improves quality of Life and PHQ9 Scores as seen by post scores and/or verbalization of changes       Quality of Life Scores: Quality of Life - 11/29/17 1408      Quality of Life   Select  Quality of Life      Quality of Life Scores   Health/Function Pre  19.73 %    Socioeconomic Pre  26.57 %    Psych/Spiritual Pre  15.93 %    Family Pre  24.2 %    GLOBAL Pre  19.73 %      Scores of 19 and below usually indicate a poorer quality of life in these areas.  A difference of  2-3 points is a clinically meaningful difference.  A difference of 2-3 points in the total score of the Quality of Life Index has been associated with significant improvement in overall quality of life, self-image, physical symptoms, and general health in studies assessing change in quality of life.  PHQ-9: Recent Review Flowsheet Data    Depression screen [facility] 2/9 11/29/2017 11/02/2017 10/19/2017 02/06/2015 01/08/2015   Decreased Interest 0 0 0 0 0   Down, Depressed, Hopeless 1 0 1 0 0   PHQ - 2 Score 1 0 1 0 0   Altered sleeping 1 - - - -   Tired, decreased energy 1 - - - -   Change in appetite 0 - - - -   Feeling bad or failure  about yourself  1 - - - -   Trouble concentrating 1 - - - -   Moving slowly or fidgety/restless 1 - - - -   Suicidal thoughts 1 - - - -   PHQ-9 Score 7 - - - -   Difficult doing work/chores Somewhat difficult - - - -  Interpretation of Total Score  Total Score Depression Severity:  1-4 = Minimal depression, 5-9 = Mild depression, 10-14 = Moderate depression, 15-19 = Moderately severe depression, 20-27 = Severe depression   Psychosocial Evaluation and Intervention: Psychosocial Evaluation - 11/29/17 1522      Psychosocial Evaluation & Interventions   Interventions  Encouraged to exercise with the program and follow exercise prescription    Continue Psychosocial Services   Follow up required by staff       Psychosocial Re-Evaluation:   Psychosocial Discharge (Final Psychosocial Re-Evaluation):   Vocational Rehabilitation: Provide vocational rehab assistance to qualifying candidates.   Vocational Rehab Evaluation & Intervention: Vocational Rehab - 11/29/17 1524      Initial Vocational Rehab Evaluation & Intervention   Assessment shows need for Vocational Rehabilitation  No       Education: Education Goals: Education classes will be provided on a weekly basis, covering required topics. Participant will state understanding/return demonstration of topics presented.  Learning Barriers/Preferences: Learning Barriers/Preferences - 11/29/17 1523      Learning Barriers/Preferences   Learning Barriers  None    Learning Preferences  Verbal Instruction;Individual Instruction;Skilled Demonstration;Group Instruction       Education Topics: Hypertension, Hypertension Reduction -Define heart disease and high blood pressure. Discus how high blood pressure affects the body and ways to reduce high blood pressure.   Exercise and Your Heart -Discuss why it is important to exercise, the FITT principles of exercise, normal and abnormal responses to exercise, and how to exercise  safely.   Angina -Discuss definition of angina, causes of angina, treatment of angina, and how to decrease risk of having angina.   Cardiac Medications -Review what the following cardiac medications are used for, how they affect the body, and side effects that may occur when taking the medications.  Medications include Aspirin, Beta blockers, calcium channel blockers, ACE Inhibitors, angiotensin receptor blockers, diuretics, digoxin, and antihyperlipidemics.   Congestive Heart Failure -Discuss the definition of CHF, how to live with CHF, the signs and symptoms of CHF, and how keep track of weight and sodium intake.   Heart Disease and Intimacy -Discus the effect sexual activity has on the heart, how changes occur during intimacy as we age, and safety during sexual activity.   Smoking Cessation / COPD -Discuss different methods to quit smoking, the health benefits of quitting smoking, and the definition of COPD.   Nutrition I: Fats -Discuss the types of cholesterol, what cholesterol does to the heart, and how cholesterol levels can be controlled.   Nutrition II: Labels -Discuss the different components of food labels and how to read food label   Heart Parts/Heart Disease and PAD -Discuss the anatomy of the heart, the pathway of blood circulation through the heart, and these are affected by heart disease.   Stress I: Signs and Symptoms -Discuss the causes of stress, how stress may lead to anxiety and depression, and ways to limit stress.   Stress II: Relaxation -Discuss different types of relaxation techniques to limit stress.   Warning Signs of Stroke / TIA -Discuss definition of a stroke, what the signs and symptoms are of a stroke, and how to identify when someone is having stroke.   Knowledge Questionnaire Score: Knowledge Questionnaire Score - 11/29/17 1523      Knowledge Questionnaire Score   Pre Score  23/24       Core Components/Risk Factors/Patient Goals  at Admission: Personal Goals and Risk Factors at Admission - 11/29/17 1524  Core Components/Risk Factors/Patient Goals on Admission    Weight Management  Weight Maintenance    Personal Goal Other  Yes    Personal Goal  Increase physical and menatal energy to before having MI    Intervention  Exercise 3xweek in CR and supplement with home exercise 2 x week.     Expected Outcomes  Reach personal goals.       Core Components/Risk Factors/Patient Goals Review:    Core Components/Risk Factors/Patient Goals at Discharge (Final Review):    ITP Comments: ITP Comments    Row Name 11/29/17 1514           ITP Comments  Patient had back surgery May 2019. He is eager to get started and so that he can increase his physical and mental energy to before MI          Comments: Patient arrived for 1st visit/orientation/education at 1230. Patient was referred to CR by Dr. Harl Bowie due to STEMI (I21.3) and Stent Placement (Z95.5). During orientation advised patient on arrival and appointment times what to wear, what to do before, during and after exercise. Reviewed attendance and class policy. Talked about inclement weather and class consultation policy. Pt is scheduled to return Cardiac Rehab on 12/01/17 at 0930. Pt was advised to come to class 15 minutes before class starts. Patient was also given instructions on meeting with the dietician and attending the Family Structure classes. Discussed RPE/Dpysnea scales. Discussed initial THR and how to find their radial and/or carotid pulse. Discussed the initial exercise prescription and how this effects their progress. Pt is eager to get started. Patient participated in warm up stretches followed by light weights and resistance bands. Patient was able to complete 6 minute walk test. Patient did not c/o any pain during or after test. Patient was measured for the equipment. Discussed equipment safety with patient. Took patient pre-anthropometric measurements.  Patient finished visit at 1500.

## 2017-11-29 NOTE — Progress Notes (Signed)
Daily Session Note  Patient Details  Name: [name] [name] MRN: [id number] Date of Birth: [birth year] Referring Provider:     CARDIAC REHAB PHASE II ORIENTATION from 11/29/2017 in [place]  Referring Provider  Branch      Encounter Date: 11/29/2017  Check In: Session Check In - 11/29/17 1230      Check-In   Supervising physician immediately available to respond to emergencies  See telemetry face sheet for immediately available MD    Location  [facility]    Staff Present  Russella Dar, MS, EP, [facility], Exercise Physiologist;Amanda Ballard, Exercise Physiologist;Debra Wynetta Emery, RN, BSN    Medication changes reported      No    Fall or balance concerns reported     No    Tobacco Cessation  --   Quit 05/2014   Warm-up and Cool-down  Performed as group-led instruction    Resistance Training Performed  Yes    VAD Patient?  No    PAD/SET Patient?  No      Pain Assessment   Currently in Pain?  No/denies    Pain Score  0-No pain    Multiple Pain Sites  No       Capillary Blood Glucose: No results found for this or any previous visit (from the past 24 hour(s)).    Social History   Tobacco Use  Smoking Status Former Smoker  . Types: Pipe  . Last attempt to quit: 06/07/2014  . Years since quitting: 3.4  Smokeless Tobacco Never Used    Goals Met:  Independence with exercise equipment Exercise tolerated well Personal goals reviewed No report of cardiac concerns or symptoms Strength training completed today  Goals Unmet:  Not Applicable  Comments: Check out: 1500   Dr. Kate Sable is Medical Director for [facility] and Pulmonary Rehab.

## 2017-12-01 ENCOUNTER — Encounter (HOSPITAL_COMMUNITY)
Admission: RE | Admit: 2017-12-01 | Discharge: 2017-12-01 | Disposition: A | Discharge status: Home / Self Care | Payer: Medicare Other | Source: Ambulatory Visit | Attending: Cardiology | Admitting: Cardiology

## 2017-12-01 DIAGNOSIS — I213 ST elevation (STEMI) myocardial infarction of unspecified site: Secondary | ICD-10-CM

## 2017-12-01 DIAGNOSIS — Z955 Presence of coronary angioplasty implant and graft: Secondary | ICD-10-CM | POA: Diagnosis not present

## 2017-12-01 NOTE — Progress Notes (Signed)
Daily Session Note  Patient Details  Name: LC JOYNT MRN: [id number] Date of Birth: 08-27-1945 Referring Provider:     CARDIAC REHAB PHASE II ORIENTATION from 11/29/2017 in [place]  Referring Provider  Branch      Encounter Date: 12/01/2017  Check In: Session Check In - 12/01/17 0930      Check-In   Supervising physician immediately available to respond to emergencies  See telemetry face sheet for immediately available MD    Location  [facility]    Staff Present  Benay Pike, Exercise Physiologist;Maidie Streight Wynetta Emery, RN, BSN    Medication changes reported      No    Fall or balance concerns reported     No    Warm-up and Cool-down  Performed as group-led instruction    Resistance Training Performed  Yes    VAD Patient?  No    PAD/SET Patient?  No      Pain Assessment   Currently in Pain?  No/denies    Pain Score  0-No pain    Multiple Pain Sites  No       Capillary Blood Glucose: No results found for this or any previous visit (from the past 24 hour(s)).    Social History   Tobacco Use  Smoking Status Former Smoker  . Types: Pipe  . Last attempt to quit: 06/07/2014  . Years since quitting: 3.4  Smokeless Tobacco Never Used    Goals Met:  Independence with exercise equipment Exercise tolerated well No report of cardiac concerns or symptoms Strength training completed today  Goals Unmet:  Not Applicable  Comments: Pt able to follow exercise prescription today without complaint.  Will continue to monitor for progression.   Check out 1030.   Dr. Kate Sable is Medical Director for [facility] Cardiac and Pulmonary Rehab.

## 2017-12-04 ENCOUNTER — Encounter (HOSPITAL_COMMUNITY)
Admission: RE | Admit: 2017-12-04 | Discharge: 2017-12-04 | Disposition: A | Discharge status: Home / Self Care | Payer: Medicare Other | Source: Ambulatory Visit | Attending: Cardiology | Admitting: Cardiology

## 2017-12-04 DIAGNOSIS — Z955 Presence of coronary angioplasty implant and graft: Secondary | ICD-10-CM

## 2017-12-04 DIAGNOSIS — I213 ST elevation (STEMI) myocardial infarction of unspecified site: Secondary | ICD-10-CM | POA: Diagnosis not present

## 2017-12-04 NOTE — Progress Notes (Signed)
Daily Session Note  Patient Details  Name: [name] [name] MRN: [id number] Date of Birth: 06/23/1945 Referring Provider:     CARDIAC REHAB PHASE II ORIENTATION from 11/29/2017 in [place] CARDIAC REHABILITATION  Referring Provider  Branch      Encounter Date: 12/04/2017  Check In: Session Check In - 12/04/17 0930      Check-In   Supervising physician immediately available to respond to emergencies  See telemetry face sheet for immediately available MD    Location  [facility]    Staff Present  Amanda Ballard, Exercise Physiologist;Debra Johnson, RN, BSN;Diane Coad, MS, EP, [facility], Exercise Physiologist    Medication changes reported      No    Fall or balance concerns reported     No    Warm-up and Cool-down  Performed as group-led instruction    Resistance Training Performed  Yes    VAD Patient?  No    PAD/SET Patient?  No      Pain Assessment   Currently in Pain?  No/denies    Pain Score  0-No pain    Multiple Pain Sites  No       Capillary Blood Glucose: No results found for this or any previous visit (from the past 24 hour(s)).    Social History   Tobacco Use  Smoking Status Former Smoker  . Types: Pipe  . Last attempt to quit: 06/07/2014  . Years since quitting: 3.4  Smokeless Tobacco Never Used    Goals Met:  Independence with exercise equipment Exercise tolerated well No report of cardiac concerns or symptoms Strength training completed today  Goals Unmet:  Not Applicable  Comments: Pt able to follow exercise prescription today without complaint.  Will continue to monitor for progression. Check out 1030.   Dr. Suresh Koneswaran is Medical Director for [place] Cardiac and Pulmonary Rehab. 

## 2017-12-04 NOTE — Progress Notes (Signed)
Cardiac Individual Treatment Plan  Patient Details  Name: [name] [name] MRN: [id number] Date of Birth: [birth year] Referring Provider:     CARDIAC REHAB PHASE II ORIENTATION from 11/29/2017 in [place]  Referring Provider  Branch      Initial Encounter Date:    CARDIAC REHAB PHASE II ORIENTATION from 11/29/2017 in [place]  Date  11/29/17      Visit Diagnosis: ST elevation myocardial infarction (STEMI), unspecified artery [facility])  Status post coronary artery stent placement  Patient's Home Medications on Admission:  Current Outpatient Medications:  .  ALPRAZolam (XANAX) 0.25 MG tablet, Take 0.25 mg by mouth at bedtime as needed for sleep., Disp: , Rfl: 5 .  amiodarone (PACERONE) 200 MG tablet, Take 1 tablet (200 mg total) by mouth daily., Disp: 90 tablet, Rfl: 3 .  apixaban (ELIQUIS) 5 MG TABS tablet, Take 1 tablet (5 mg total) by mouth 2 (two) times daily., Disp: 60 tablet, Rfl: 2 .  aspirin EC 81 MG tablet, Take 81 mg by mouth daily., Disp: , Rfl:  .  Calcium Citrate-Vitamin D (CALCIUM CITRATE + D3 PO), Take 1 tablet by mouth daily., Disp: , Rfl:  .  carvedilol (COREG) 6.25 MG tablet, Take 1 tablet (6.25 mg total) by mouth 2 (two) times daily with a meal., Disp: 60 tablet, Rfl: 2 .  clopidogrel (PLAVIX) 75 MG tablet, Take 1 tablet (75 mg total) by mouth daily., Disp: 90 tablet, Rfl: 2 .  furosemide (LASIX) 40 MG tablet, Take 40 mg by mouth daily as needed for fluid or edema., Disp: , Rfl:  .  Krill Oil 350 MG CAPS, Take 350 mg by mouth daily., Disp: , Rfl:  .  Lactobacillus (ACIDOPHILUS PROBIOTIC) 10 MG TABS, Take 10 mg by mouth daily. , Disp: , Rfl:  .  losartan (COZAAR) 25 MG tablet, Take 1 tablet (25 mg total) by mouth daily., Disp: 30 tablet, Rfl: 2 .  Multiple Vitamins-Minerals (SENIOR VITES PO), Take 1 tablet by mouth daily., Disp: , Rfl:  .  nitroGLYCERIN (NITROSTAT) 0.4 MG SL tablet, Place 1 tablet (0.4 mg total) under the  tongue every 5 (five) minutes as needed., Disp: 25 tablet, Rfl: 2 .  NON FORMULARY, Apply 1 application topically daily. Fluc2% Toe Nail Drops for Fungus, Disp: , Rfl:  .  rosuvastatin (CRESTOR) 40 MG tablet, Take 1 tablet (40 mg total) by mouth daily., Disp: 90 tablet, Rfl: 1 .  tamsulosin (FLOMAX) 0.4 MG CAPS capsule, Take 0.4 mg by mouth daily. , Disp: , Rfl:  .  tiotropium (SPIRIVA) 18 MCG inhalation capsule, Place 1 capsule (18 mcg total) into inhaler and inhale daily., Disp: 30 capsule, Rfl: 1  Past Medical History: Past Medical History:  Diagnosis Date  . Acute systolic heart failure ([place])   . COPD (chronic obstructive pulmonary disease) ([place])   . Hyperlipidemia   . Hypertension   . Low serum testosterone level   . PAF (paroxysmal atrial fibrillation) ([place])   . Pneumonia 05/2014  . Shortness of breath dyspnea   . STEMI (ST elevation myocardial infarction) ([place])    09/27/17 PCI/DES x1 mLAD, PTCA of the diag branch. EF 30% Lifevest at discharge.   . Urinary frequency     Tobacco Use: Social History   Tobacco Use  Smoking Status Former Smoker  . Types: Pipe  . Last attempt to quit: 06/07/2014  . Years since quitting: 3.4  Smokeless Tobacco Never Used    Labs: Recent Review Flowsheet  Data    Labs for ITP Cardiac and Pulmonary Rehab Latest Ref Rng & Units 06/26/2014 06/28/2014 09/27/2017   PHART 7.350 - 7.450 7.491(H) 7.450 -   PCO2ART 35.0 - 45.0 mmHg 29.5(L) 34.2(L) -   HCO3 20.0 - 24.0 mEq/L 22.0 23.4 -   TCO2 22 - 32 mmol/L 22.8 24.5 22   ACIDBASEDEF 0.0 - 2.0 mmol/L 0.7 0.1 -   O2SAT % 91.5 95.6 -      Capillary Blood Glucose: Lab Results  Component Value Date   GLUCAP 114 (H) 06/07/2014     Exercise Target Goals: Exercise Program Goal: Individual exercise prescription set using results from initial 6 min walk test and THRR while considering  patient's activity barriers and safety.   Exercise Prescription Goal: Starting with aerobic activity 30 plus minutes a  day, 3 days per week for initial exercise prescription. Provide home exercise prescription and guidelines that participant acknowledges understanding prior to discharge.  Activity Barriers & Risk Stratification: Activity Barriers & Cardiac Risk Stratification - 11/29/17 1344      Activity Barriers & Cardiac Risk Stratification   Activity Barriers  Back Problems;Shortness of Breath   spinal fusion; COPD    Cardiac Risk Stratification  High       6 Minute Walk: 6 Minute Walk    Row Name 11/29/17 1343         6 Minute Walk   Phase  Initial     Distance  1700 feet     Walk Time  6 minutes     # of Rest Breaks  0     MPH  3.22     METS  3.47     RPE  9     Perceived Dyspnea   11     VO2 Peak  12.1     Symptoms  No     Resting HR  58 bpm     Resting BP  110/52     Resting Oxygen Saturation   97 %     Exercise Oxygen Saturation  during 6 min walk  94 %     Max Ex. HR  84 bpm     Max Ex. BP  126/60     2 Minute Post BP  110/58        Oxygen Initial Assessment:   Oxygen Re-Evaluation:   Oxygen Discharge (Final Oxygen Re-Evaluation):   Initial Exercise Prescription: Initial Exercise Prescription - 11/29/17 1300      Date of Initial Exercise RX and Referring Provider   Date  11/29/17    Referring Provider  Branch      Treadmill   MPH  2.5    Grade  0    Minutes  17    METs  2.91      Recumbant Elliptical   Level  1    RPM  49    Watts  55    Minutes  17    METs  4      Prescription Details   Frequency (times per week)  3    Duration  Progress to 30 minutes of continuous aerobic without signs/symptoms of physical distress      Intensity   THRR 40-80% of Max Heartrate  [phone]    Ratings of Perceived Exertion  11-13    Perceived Dyspnea  0-4      Progression   Progression  Continue to progress workloads to maintain intensity without signs/symptoms of physical distress.  Resistance Training   Training Prescription  Yes    Weight  1    Reps   10-15       Perform Capillary Blood Glucose checks as needed.  Exercise Prescription Changes:   Exercise Comments:  Exercise Comments    Row Name 12/04/17 0757           Exercise Comments  Patient has just started. He is doing well so far. Will continue to monitor progress and advance accordingly.           Exercise Goals and Review:  Exercise Goals    Row Name 11/29/17 1436             Exercise Goals   Increase Physical Activity  Yes       Intervention  Develop an individualized exercise prescription for aerobic and resistive training based on initial evaluation findings, risk stratification, comorbidities and participant's personal goals.       Increase Strength and Stamina  Yes       Intervention  Provide advice, education, support and counseling about physical activity/exercise needs.;Develop an individualized exercise prescription for aerobic and resistive training based on initial evaluation findings, risk stratification, comorbidities and participant's personal goals.       Expected Outcomes  Short Term: Increase workloads from initial exercise prescription for resistance, speed, and METs.;Short Term: Perform resistance training exercises routinely during rehab and add in resistance training at home;Long Term: Improve cardiorespiratory fitness, muscular endurance and strength as measured by increased METs and functional capacity (6MWT)       Able to understand and use rate of perceived exertion (RPE) scale  Yes       Intervention  Provide education and explanation on how to use RPE scale       Expected Outcomes  Short Term: Able to use RPE daily in rehab to express subjective intensity level       Knowledge and understanding of Target Heart Rate Range (THRR)  Yes       Intervention  Provide education and explanation of THRR including how the numbers were predicted and where they are located for reference       Expected Outcomes  Short Term: Able to state/look up  THRR;Long Term: Able to use THRR to govern intensity when exercising independently       Able to check pulse independently  Yes       Intervention  Provide education and demonstration on how to check pulse in carotid and radial arteries.       Expected Outcomes  Short Term: Able to explain why pulse checking is important during independent exercise;Long Term: Able to check pulse independently and accurately       Understanding of Exercise Prescription  Yes       Intervention  Provide education, explanation, and written materials on patient's individual exercise prescription       Expected Outcomes  Short Term: Able to explain program exercise prescription;Long Term: Able to explain home exercise prescription to exercise independently          Exercise Goals Re-Evaluation : Exercise Goals Re-Evaluation    Row Name 12/04/17 0753             Exercise Goal Re-Evaluation   Exercise Goals Review  Increase Physical Activity;Able to understand and use rate of perceived exertion (RPE) scale;Knowledge and understanding of Target Heart Rate Range (THRR);Able to check pulse independently;Understanding of Exercise Prescription       Comments  Patient has  just started program. He has been to 2 visits. Patient is doing well in classes so far and we will continue to monitor for progress. We will progress his his equipment and weights every 2 weeks.        Expected Outcomes  Get back to road trips and boogie boarding.            Discharge Exercise Prescription (Final Exercise Prescription Changes):   Nutrition:  Target Goals: Understanding of nutrition guidelines, daily intake of sodium 1500mg , cholesterol 200mg , calories 30% from fat and 7% or less from saturated fats, daily to have 5 or more servings of fruits and vegetables.  Biometrics: Pre Biometrics - 11/29/17 1346      Pre Biometrics   Height  6' (1.829 m)    Waist Circumference  36 inches    Hip Circumference  40 inches    Waist to  Hip Ratio  0.9 %    BMI (Calculated)  26.37    Triceps Skinfold  5 mm    % Body Fat  20.6 %    Grip Strength  28.6 kg    Flexibility  0 in    Single Leg Stand  5.86 seconds        Nutrition Therapy Plan and Nutrition Goals: Nutrition Therapy & Goals - 12/04/17 1315      Personal Nutrition Goals   Personal Goal #2  Patient eating low sodium, low fat and heart healthy    Comments  RD appointment in October.        Nutrition Assessments: Nutrition Assessments - 11/29/17 1523      MEDFICTS Scores   Pre Score  27       Nutrition Goals Re-Evaluation:   Nutrition Goals Discharge (Final Nutrition Goals Re-Evaluation):   Psychosocial: Target Goals: Acknowledge presence or absence of significant depression and/or stress, maximize coping skills, provide positive support system. Participant is able to verbalize types and ability to use techniques and skills needed for reducing stress and depression.  Initial Review & Psychosocial Screening: Initial Psych Review & Screening - 11/29/17 1518      Initial Review   Current issues with  Current Depression;Current Stress Concerns    Source of Stress Concerns  Family   His recent health issues     Family Dynamics   Good Support System?  Yes      Barriers   Psychosocial barriers to participate in program  The patient should benefit from training in stress management and relaxation.;Psychosocial barriers identified (see note)      Screening Interventions   Interventions  Encouraged to exercise    Expected Outcomes  Short Term goal: Identification and review with participant of any Quality of Life or Depression concerns found by scoring the questionnaire.;Long Term goal: The participant improves quality of Life and PHQ9 Scores as seen by post scores and/or verbalization of changes       Quality of Life Scores: Quality of Life - 11/29/17 1408      Quality of Life   Select  Quality of Life      Quality of Life Scores    Health/Function Pre  19.73 %    Socioeconomic Pre  26.57 %    Psych/Spiritual Pre  15.93 %    Family Pre  24.2 %    GLOBAL Pre  19.73 %      Scores of 19 and below usually indicate a poorer quality of life in these areas.  A difference of  2-3  points is a clinically meaningful difference.  A difference of 2-3 points in the total score of the Quality of Life Index has been associated with significant improvement in overall quality of life, self-image, physical symptoms, and general health in studies assessing change in quality of life.  PHQ-9: Recent Review Flowsheet Data    Depression screen [facility] 2/9 11/29/2017 11/02/2017 10/19/2017 02/06/2015 01/08/2015   Decreased Interest 0 0 0 0 0   Down, Depressed, Hopeless 1 0 1 0 0   PHQ - 2 Score 1 0 1 0 0   Altered sleeping 1 - - - -   Tired, decreased energy 1 - - - -   Change in appetite 0 - - - -   Feeling bad or failure about yourself  1 - - - -   Trouble concentrating 1 - - - -   Moving slowly or fidgety/restless 1 - - - -   Suicidal thoughts 1 - - - -   PHQ-9 Score 7 - - - -   Difficult doing work/chores Somewhat difficult - - - -     Interpretation of Total Score  Total Score Depression Severity:  1-4 = Minimal depression, 5-9 = Mild depression, 10-14 = Moderate depression, 15-19 = Moderately severe depression, 20-27 = Severe depression   Psychosocial Evaluation and Intervention: Psychosocial Evaluation - 11/29/17 1522      Psychosocial Evaluation & Interventions   Interventions  Encouraged to exercise with the program and follow exercise prescription    Continue Psychosocial Services   Follow up required by staff       Psychosocial Re-Evaluation:   Psychosocial Discharge (Final Psychosocial Re-Evaluation):   Vocational Rehabilitation: Provide vocational rehab assistance to qualifying candidates.   Vocational Rehab Evaluation & Intervention: Vocational Rehab - 11/29/17 1524      Initial Vocational Rehab Evaluation &  Intervention   Assessment shows need for Vocational Rehabilitation  No       Education: Education Goals: Education classes will be provided on a weekly basis, covering required topics. Participant will state understanding/return demonstration of topics presented.  Learning Barriers/Preferences: Learning Barriers/Preferences - 11/29/17 1523      Learning Barriers/Preferences   Learning Barriers  None    Learning Preferences  Verbal Instruction;Individual Instruction;Skilled Demonstration;Group Instruction       Education Topics: Hypertension, Hypertension Reduction -Define heart disease and high blood pressure. Discus how high blood pressure affects the body and ways to reduce high blood pressure.   Exercise and Your Heart -Discuss why it is important to exercise, the FITT principles of exercise, normal and abnormal responses to exercise, and how to exercise safely.   Angina -Discuss definition of angina, causes of angina, treatment of angina, and how to decrease risk of having angina.   Cardiac Medications -Review what the following cardiac medications are used for, how they affect the body, and side effects that may occur when taking the medications.  Medications include Aspirin, Beta blockers, calcium channel blockers, ACE Inhibitors, angiotensin receptor blockers, diuretics, digoxin, and antihyperlipidemics.   Congestive Heart Failure -Discuss the definition of CHF, how to live with CHF, the signs and symptoms of CHF, and how keep track of weight and sodium intake.   Heart Disease and Intimacy -Discus the effect sexual activity has on the heart, how changes occur during intimacy as we age, and safety during sexual activity.   Smoking Cessation / COPD -Discuss different methods to quit smoking, the health benefits of quitting smoking, and the definition of COPD.  Nutrition I: Fats -Discuss the types of cholesterol, what cholesterol does to the heart, and how  cholesterol levels can be controlled.   Nutrition II: Labels -Discuss the different components of food labels and how to read food label   Heart Parts/Heart Disease and PAD -Discuss the anatomy of the heart, the pathway of blood circulation through the heart, and these are affected by heart disease.   Stress I: Signs and Symptoms -Discuss the causes of stress, how stress may lead to anxiety and depression, and ways to limit stress.   Stress II: Relaxation -Discuss different types of relaxation techniques to limit stress.   Warning Signs of Stroke / TIA -Discuss definition of a stroke, what the signs and symptoms are of a stroke, and how to identify when someone is having stroke.   Knowledge Questionnaire Score: Knowledge Questionnaire Score - 11/29/17 1523      Knowledge Questionnaire Score   Pre Score  23/24       Core Components/Risk Factors/Patient Goals at Admission: Personal Goals and Risk Factors at Admission - 11/29/17 1524      Core Components/Risk Factors/Patient Goals on Admission    Weight Management  Weight Maintenance    Personal Goal Other  Yes    Personal Goal  Increase physical and menatal energy to before having MI    Intervention  Exercise 3xweek in CR and supplement with home exercise 2 x week.     Expected Outcomes  Reach personal goals.       Core Components/Risk Factors/Patient Goals Review:    Core Components/Risk Factors/Patient Goals at Discharge (Final Review):    ITP Comments: ITP Comments    Row Name 11/29/17 1514 12/04/17 1316         ITP Comments  Patient had back surgery May 2019. He is eager to get started and so that he can increase his physical and mental energy to before MI  Patient is new to program completing 3 sessions. Will continue to monitor for progress.          Comments: ITP 30 Day REVIEW Patient is new to program completing 3 sessions. Will continue to monitor for progress.

## 2017-12-06 ENCOUNTER — Encounter: Payer: Self-pay | Admitting: Cardiology

## 2017-12-06 ENCOUNTER — Ambulatory Visit (INDEPENDENT_AMBULATORY_CARE_PROVIDER_SITE_OTHER): Payer: Medicare Other | Admitting: Cardiology

## 2017-12-06 ENCOUNTER — Encounter (HOSPITAL_COMMUNITY)
Admission: RE | Admit: 2017-12-06 | Discharge: 2017-12-06 | Disposition: A | Discharge status: Home / Self Care | Payer: Medicare Other | Source: Ambulatory Visit | Attending: Cardiology | Admitting: Cardiology

## 2017-12-06 VITALS — BP 102/68 | HR 73 | Ht 72.0 in | Wt 193.0 lb

## 2017-12-06 DIAGNOSIS — I2510 Atherosclerotic heart disease of native coronary artery without angina pectoris: Secondary | ICD-10-CM

## 2017-12-06 DIAGNOSIS — I213 ST elevation (STEMI) myocardial infarction of unspecified site: Secondary | ICD-10-CM

## 2017-12-06 DIAGNOSIS — I255 Ischemic cardiomyopathy: Secondary | ICD-10-CM | POA: Diagnosis not present

## 2017-12-06 DIAGNOSIS — I5022 Chronic systolic (congestive) heart failure: Secondary | ICD-10-CM

## 2017-12-06 DIAGNOSIS — Z955 Presence of coronary angioplasty implant and graft: Secondary | ICD-10-CM | POA: Diagnosis not present

## 2017-12-06 DIAGNOSIS — I4891 Unspecified atrial fibrillation: Secondary | ICD-10-CM | POA: Diagnosis not present

## 2017-12-06 DIAGNOSIS — E782 Mixed hyperlipidemia: Secondary | ICD-10-CM | POA: Diagnosis not present

## 2017-12-06 DIAGNOSIS — N183 Chronic kidney disease, stage 3 unspecified: Secondary | ICD-10-CM

## 2017-12-06 MED ORDER — LOSARTAN POTASSIUM 25 MG PO TABS: 25.0000 mg | ORAL_TABLET | Freq: Every day | ORAL | 3 refills | Status: DC

## 2017-12-06 MED ORDER — APIXABAN 5 MG PO TABS: 5.0000 mg | ORAL_TABLET | Freq: Two times a day (BID) | ORAL | 3 refills | Status: DC

## 2017-12-06 MED ORDER — CARVEDILOL 6.25 MG PO TABS: 6.2500 mg | ORAL_TABLET | Freq: Two times a day (BID) | ORAL | 3 refills | Status: DC

## 2017-12-06 NOTE — Progress Notes (Signed)
Clinical Summary Mr. Nelles is a 72 y.o.male seen today for follow up of the following medical problems.    1. CAD/ICM/Chronic systolic HF - admit 07/91 with STEMI. Received DES to LAD, PTCA of diag - 09/2017 echo LVEF 30-35%. He was discharged with lifevest for primary prevention - repeat 10/2017 35-40%, lifevest discontinued - given afib was started on plavix, asa, and anticoag x 1 month, then plavix and anticoag.  - medical therapy limited due to renal dysfunction, last Cr 1.58 - referred to cardiac rehab, just started on Friday  - doing some [employer] workouts. Treadmill speed of 4 mph x 30 minutes. Tolerates well - compliant with meds. Has not required prn lasix.  - weights stable 188-190 lbs.   - can have some dizziness with movement.    2. Afib - new diagnosis during his admission with acute MI. Unclear if isolated in setting of MI - started on amio, has been on 200mg  daily   3. Hyperlipidemia - crestor increased to 40mg  daily during recent admission   4. CKD III - limiting nephrotoxic meds   Past Medical History:  Diagnosis Date  . Acute systolic heart failure ([place])   . COPD (chronic obstructive pulmonary disease) ([place])   . Hyperlipidemia   . Hypertension   . Low serum testosterone level   . PAF (paroxysmal atrial fibrillation) ([place])   . Pneumonia 05/2014  . Shortness of breath dyspnea   . STEMI (ST elevation myocardial infarction) ([place])    09/27/17 PCI/DES x1 mLAD, PTCA of the diag Isobella Ascher. EF 30% Lifevest at discharge.   . Urinary frequency      No Known Allergies   Current Outpatient Medications  Medication Sig Dispense Refill  . ALPRAZolam (XANAX) 0.25 MG tablet Take 0.25 mg by mouth at bedtime as needed for sleep.  5  . amiodarone (PACERONE) 200 MG tablet Take 1 tablet (200 mg total) by mouth daily. 90 tablet 3  . apixaban (ELIQUIS) 5 MG TABS tablet Take 1 tablet (5 mg total) by mouth 2 (two) times daily. 60 tablet 2  . aspirin EC 81 MG tablet Take  81 mg by mouth daily.    . Calcium Citrate-Vitamin D (CALCIUM CITRATE + D3 PO) Take 1 tablet by mouth daily.    . carvedilol (COREG) 6.25 MG tablet Take 1 tablet (6.25 mg total) by mouth 2 (two) times daily with a meal. 60 tablet 2  . clopidogrel (PLAVIX) 75 MG tablet Take 1 tablet (75 mg total) by mouth daily. 90 tablet 2  . furosemide (LASIX) 40 MG tablet Take 40 mg by mouth daily as needed for fluid or edema.    [name] Oil 350 MG CAPS Take 350 mg by mouth daily.    . Lactobacillus (ACIDOPHILUS PROBIOTIC) 10 MG TABS Take 10 mg by mouth daily.     Marland Kitchen losartan (COZAAR) 25 MG tablet Take 1 tablet (25 mg total) by mouth daily. 30 tablet 2  . Multiple Vitamins-Minerals (SENIOR VITES PO) Take 1 tablet by mouth daily.    . nitroGLYCERIN (NITROSTAT) 0.4 MG SL tablet Place 1 tablet (0.4 mg total) under the tongue every 5 (five) minutes as needed. 25 tablet 2  . NON FORMULARY Apply 1 application topically daily. Fluc2% Toe Nail Drops for Fungus    . rosuvastatin (CRESTOR) 40 MG tablet Take 1 tablet (40 mg total) by mouth daily. 90 tablet 1  . tamsulosin (FLOMAX) 0.4 MG CAPS capsule Take 0.4 mg by mouth daily.     Marland Kitchen  tiotropium (SPIRIVA) 18 MCG inhalation capsule Place 1 capsule (18 mcg total) into inhaler and inhale daily. 30 capsule 1   No current facility-administered medications for this visit.      Past Surgical History:  Procedure Laterality Date  . APPENDECTOMY    . BACK SURGERY    . COLONOSCOPY  2011   Tubular adenoma per patient  . COLONOSCOPY N/A 03/03/2015   Procedure: COLONOSCOPY;  Surgeon: Danie Binder, MD;  [facility];  Service: Endoscopy;  Laterality: N/A;  1245 - moved to 1:00 - office to notify pt   . CORONARY/GRAFT ACUTE MI REVASCULARIZATION N/A 09/27/2017   Procedure: Coronary/Graft Acute MI Revascularization;  Surgeon: Burnell Blanks, MD;  Location: [place] [facility] LAB;  Service: Cardiovascular;  Laterality: N/A;  . EMPYEMA DRAINAGE Right 06/27/2014    Procedure: EMPYEMA DRAINAGE;  Surgeon: Grace Isaac, MD;  Location: [place];  Service: Thoracic;  Laterality: Right;  . HEMORRHOID BANDING N/A 03/03/2015   Procedure: HEMORRHOID BANDING;  Surgeon: Danie Binder, MD;  [facility];  Service: Endoscopy;  Laterality: N/A;  Recomended to see a Psychologist, sport and exercise.  [name]    . INTRAVASCULAR ULTRASOUND/IVUS N/A 09/27/2017   Procedure: Intravascular Ultrasound/IVUS;  Surgeon: Burnell Blanks, MD;  Location: [place] [facility] LAB;  Service: Cardiovascular;  Laterality: N/A;  . LEFT HEART CATH AND CORONARY ANGIOGRAPHY N/A 09/27/2017   Procedure: LEFT HEART CATH AND CORONARY ANGIOGRAPHY;  Surgeon: Burnell Blanks, MD;  Location: [place] [facility] LAB;  Service: Cardiovascular;  Laterality: N/A;  . TEE WITHOUT CARDIOVERSION N/A 07/14/2014   Procedure: TRANSESOPHAGEAL ECHOCARDIOGRAM (TEE);  Surgeon: Sueanne Margarita, MD;  Location: [facility] ENDOSCOPY;  Service: Cardiovascular;  Laterality: N/A;  . VARICOSE VEIN SURGERY    . VIDEO ASSISTED THORACOSCOPY Right 06/27/2014   Procedure: VIDEO ASSISTED THORACOSCOPY;  Surgeon: Grace Isaac, MD;  Location: [place];  Service: Thoracic;  Laterality: Right;  Marland Kitchen VIDEO BRONCHOSCOPY N/A 06/27/2014   Procedure: VIDEO BRONCHOSCOPY;  Surgeon: Grace Isaac, MD;  Location[facility] OR;  Service: Thoracic;  Laterality: N/A;     No Known Allergies    Family History  Problem Relation Age of Onset  . Tuberculosis Father   . Hypertension Father   . COPD Mother   . Hypertension Mother   . Colon cancer Neg Hx      Social History Mr. Leib reports that he quit smoking about 3 years ago. His smoking use included pipe. He has never used smokeless tobacco. Mr. Mortimer reports that he drinks alcohol.   Review of Systems CONSTITUTIONAL: No weight loss, fever, chills, weakness or fatigue.  HEENT: Eyes: No visual loss, blurred vision, double vision or yellow sclerae.No hearing loss, sneezing, congestion, runny nose  or sore throat.  SKIN: No rash or itching.  CARDIOVASCULAR: per hpi RESPIRATORY: No shortness of breath, cough or sputum.  GASTROINTESTINAL: No anorexia, nausea, vomiting or diarrhea. No abdominal pain or blood.  GENITOURINARY: No burning on urination, no polyuria NEUROLOGICAL: No headache, dizziness, syncope, paralysis, ataxia, numbness or tingling in the extremities. No change in bowel or bladder control.  MUSCULOSKELETAL: No muscle, back pain, joint pain or stiffness.  LYMPHATICS: No enlarged nodes. No history of splenectomy.  PSYCHIATRIC: No history of depression or anxiety.  ENDOCRINOLOGIC: No reports of sweating, cold or heat intolerance. No polyuria or polydipsia.  Marland Kitchen   Physical Examination Vitals:   12/06/17 1345  BP: 102/68  Pulse: 73  SpO2: 96%   Vitals:  12/06/17 1345  Weight: 193 lb (87.5 kg)  Height: 6' (1.829 m)    Gen: resting comfortably, no acute distress HEENT: no scleral icterus, pupils equal round and reactive, no palptable cervical adenopathy,  CV: RRR, no m/r/g, no jvd Resp: Clear to auscultation bilaterally GI: abdomen is soft, non-tender, non-distended, normal bowel sounds, no hepatosplenomegaly MSK: extremities are warm, no edema.  Skin: warm, no rash Neuro:  no focal deficits Psych: appropriate affect   Diagnostic Studies  09/2017 cath  Prox RCA lesion is 30% stenosed.  Prox LAD lesion is 100% stenosed.  Prox Cx lesion is 20% stenosed.  A drug-eluting stent was successfully placed using a STENT SYNERGY DES 3X16.  Post intervention, there is a 0% residual stenosis.  Ost 1st Diag lesion is 100% stenosed.  Balloon angioplasty was performed using a BALLOON SAPPHIRE 2.5X12.  Post intervention, there is a 40% residual stenosis.  Prox LAD to Mid LAD lesion is 40% stenosed.   1. Acute anterior STEMI secondary to occlusion of the mid LAD 2. Successful PTCA/DES x 1 mid LAD 3. Successful PTCA/angioplasty ostium of the Diagonal Birl [place].  4.  Mild non-obstructive disease in the RCA, circumflex and mid LAD  Post cath Recommendations:  Will admit to the ICU and continue Aggrastat for 6 hours. Will start high intensity statin and a beta blocker. Echo later today.   . Recommend uninterrupted dual antiplatelet therapy with Aspirin 81mg  daily and Ticagrelor 90mg  twice daily for a minimum of 12 months (ACS - Class I recommendation).    09/2017 echo Study Conclusions  - Left ventricle: The cavity size was normal. There was mild focal   basal hypertrophy of the septum. Systolic function was moderately   to severely reduced. The estimated ejection fraction was in the   range of 30% to 35%. Severe hypokinesis of the mid to apical   anteroseptal, anterior, and anterolateral myocardium. Dyskinetic   at apex. Doppler parameters are consistent with abnormal left   ventricular relaxation (grade 1 diastolic dysfunction). - Aortic valve: Trileaflet; mildly thickened, mildly calcified   leaflets. - Mitral valve: Calcified annulus. There was trivial regurgitation. - Pulmonary arteries: PA peak pressure: 41 mm Hg (S). - Pericardium, extracardiac: A small pericardial effusion was   identified.  Impressions:  - New reduction in LV EF with regional wall motion abnormalities in   the mid to apical anteroseptum, anterior, and anterolateral walls   with dyskinesis at the apex. Small pericardial effusion noted.  10/2017 echo Study Conclusions  - Left ventricle: The cavity size was normal. Wall thickness was   increased in a pattern of moderate LVH. Systolic function was   moderately reduced. The estimated ejection fraction was in the   range of 35% to 40%. Doppler parameters are consistent with   abnormal left ventricular relaxation (grade 1 diastolic   dysfunction). Doppler parameters are consistent with high   ventricular filling pressure. - Regional wall motion abnormality: Hypokinesis of the apical   septal, apical lateral, and apical  myocardium. - Aortic valve: Mildly calcified annulus. Trileaflet; mildly   thickened leaflets. Valve area (VTI): 4.91 cm^2. Valve area   (Vmax): 4.27 cm^2. Valve area (Vmean): 3.54 cm^2. - Mitral valve: Mildly calcified annulus. Mildly thickened leaflets   . - Left atrium: The atrium was moderately dilated. - Pericardium, extracardiac: There is a small circumferential   pericardial effusion. The effusion measures 0.8 cm adjacent to   the LV. There is no evidence of tamponade physiology. - Technically adequate study.  Assessment and Plan  1. CAD/ICM/chronic systolic HF - no recent symptoms - medical therapy limited by soft bp's and renal dysfunction - continue current regimen. Mild orthostatic symptoms but tolerable.   2. Afib - new diagnosis in setting of MI, unclear if isolated event or prior undiagnosed episodes - stop amio and monitor for recurrence. Continue eliquis at this time - EKG today shows NSR  3. Hyperlipidemia - continue high dose statin  4. CKD 3 - continue to monitor labs.    F/u 3 months   Arnoldo Lenis, M.D.

## 2017-12-06 NOTE — Progress Notes (Signed)
Daily Session Note  Patient Details  Name: [name] [name] MRN: [id number] Date of Birth: 1945-07-27 Referring Provider:     CARDIAC REHAB PHASE II ORIENTATION from 11/29/2017 in [place]  Referring Provider  Branch      Encounter Date: 12/06/2017  Check In: Session Check In - 12/06/17 0930      Check-In   Supervising physician immediately available to respond to emergencies  See telemetry face sheet for immediately available MD    Location  [facility]    Staff Present  Russella Dar, MS, EP, [facility], Exercise Physiologist;Saima Monterroso Zachery Conch, Exercise Physiologist;Debra Wynetta Emery, RN, BSN    Medication changes reported      No    Fall or balance concerns reported     No    Warm-up and Cool-down  Performed as group-led instruction    Resistance Training Performed  Yes    VAD Patient?  No    PAD/SET Patient?  No      Pain Assessment   Currently in Pain?  No/denies    Pain Score  0-No pain    Multiple Pain Sites  No       Capillary Blood Glucose: No results found for this or any previous visit (from the past 24 hour(s)).    Social History   Tobacco Use  Smoking Status Former Smoker  . Types: Pipe  . Last attempt to quit: 06/07/2014  . Years since quitting: 3.5  Smokeless Tobacco Never Used    Goals Met:  Independence with exercise equipment Exercise tolerated well No report of cardiac concerns or symptoms Strength training completed today  Goals Unmet:  Not Applicable  Comments: Pt able to follow exercise prescription today without complaint.  Will continue to monitor for progression. Check out 10:30.   Dr. Kate Sable is Medical Director for [facility] Cardiac and Pulmonary Rehab.

## 2017-12-06 NOTE — Patient Instructions (Signed)
Medication Instructions:  Stop amiodarone   Labwork: none  Testing/Procedures: none  Follow-Up: Your physician recommends that you schedule a follow-up appointment in: 3 months    Any Other Special Instructions Will Be Listed Below (If Applicable).     If you need a refill on your cardiac medications before your next appointment, please call your pharmacy.

## 2017-12-07 DIAGNOSIS — J449 Chronic obstructive pulmonary disease, unspecified: Secondary | ICD-10-CM | POA: Diagnosis not present

## 2017-12-07 DIAGNOSIS — N183 Chronic kidney disease, stage 3 (moderate): Secondary | ICD-10-CM | POA: Diagnosis not present

## 2017-12-07 DIAGNOSIS — R739 Hyperglycemia, unspecified: Secondary | ICD-10-CM | POA: Diagnosis not present

## 2017-12-07 DIAGNOSIS — Z125 Encounter for screening for malignant neoplasm of prostate: Secondary | ICD-10-CM | POA: Diagnosis not present

## 2017-12-07 DIAGNOSIS — I38 Endocarditis, valve unspecified: Secondary | ICD-10-CM | POA: Diagnosis not present

## 2017-12-07 DIAGNOSIS — R7989 Other specified abnormal findings of blood chemistry: Secondary | ICD-10-CM | POA: Diagnosis not present

## 2017-12-07 DIAGNOSIS — Z0000 Encounter for general adult medical examination without abnormal findings: Secondary | ICD-10-CM | POA: Diagnosis not present

## 2017-12-07 DIAGNOSIS — N401 Enlarged prostate with lower urinary tract symptoms: Secondary | ICD-10-CM | POA: Diagnosis not present

## 2017-12-07 DIAGNOSIS — E785 Hyperlipidemia, unspecified: Secondary | ICD-10-CM | POA: Diagnosis not present

## 2017-12-07 DIAGNOSIS — I2510 Atherosclerotic heart disease of native coronary artery without angina pectoris: Secondary | ICD-10-CM | POA: Diagnosis not present

## 2017-12-07 DIAGNOSIS — I10 Essential (primary) hypertension: Secondary | ICD-10-CM | POA: Diagnosis not present

## 2017-12-08 ENCOUNTER — Encounter (HOSPITAL_COMMUNITY)
Admission: RE | Admit: 2017-12-08 | Discharge: 2017-12-08 | Disposition: A | Discharge status: Home / Self Care | Payer: Medicare Other | Source: Ambulatory Visit | Attending: Cardiology | Admitting: Cardiology

## 2017-12-08 ENCOUNTER — Other Ambulatory Visit: Payer: Self-pay

## 2017-12-08 DIAGNOSIS — Z955 Presence of coronary angioplasty implant and graft: Secondary | ICD-10-CM

## 2017-12-08 DIAGNOSIS — I213 ST elevation (STEMI) myocardial infarction of unspecified site: Secondary | ICD-10-CM

## 2017-12-08 NOTE — Patient Outreach (Signed)
[facility]) Care Management  12/08/2017  [name] [name] 1945/08/15 [id number]    RNCM returned member's call regarding home visit. Mr. Bovenzi reported he was attending Cardiac Rehab at the time of the call. Agreed to return call later today.   [place] [phone]

## 2017-12-08 NOTE — Progress Notes (Signed)
Daily Session Note  Patient Details  Name: [name] [name] MRN: [id number] Date of Birth: [birth year] Referring Provider:     CARDIAC REHAB PHASE II ORIENTATION from 11/29/2017 in [place]  Referring Provider  Branch      Encounter Date: 12/08/2017  Check In: Session Check In - 12/08/17 0930      Check-In   Supervising physician immediately available to respond to emergencies  See telemetry face sheet for immediately available MD    Location  [facility]    Staff Present  Russella Dar, MS, EP, [facility], Exercise Physiologist;Burhan Barham Zachery Conch, Exercise Physiologist;Debra Wynetta Emery, RN, BSN    Medication changes reported      No    Fall or balance concerns reported     No    Warm-up and Cool-down  Performed as group-led instruction    Resistance Training Performed  Yes    VAD Patient?  No    PAD/SET Patient?  No      Pain Assessment   Currently in Pain?  No/denies    Pain Score  0-No pain    Multiple Pain Sites  No       Capillary Blood Glucose: No results found for this or any previous visit (from the past 24 hour(s)).    Social History   Tobacco Use  Smoking Status Former Smoker  . Types: Pipe  . Last attempt to quit: 06/07/2014  . Years since quitting: 3.5  Smokeless Tobacco Never Used    Goals Met:  Independence with exercise equipment Exercise tolerated well No report of cardiac concerns or symptoms Strength training completed today  Goals Unmet:  Not Applicable  Comments: Pt able to follow exercise prescription today without complaint.  Will continue to monitor for progression. Check out 1030.   Dr. Kate Sable is Medical Director for [facility] Cardiac and Pulmonary Rehab.

## 2017-12-11 ENCOUNTER — Encounter (HOSPITAL_COMMUNITY)
Admission: RE | Admit: 2017-12-11 | Discharge: 2017-12-11 | Disposition: A | Discharge status: Home / Self Care | Payer: Medicare Other | Source: Ambulatory Visit | Attending: Cardiology | Admitting: Cardiology

## 2017-12-11 DIAGNOSIS — Z955 Presence of coronary angioplasty implant and graft: Secondary | ICD-10-CM

## 2017-12-11 DIAGNOSIS — I213 ST elevation (STEMI) myocardial infarction of unspecified site: Secondary | ICD-10-CM

## 2017-12-11 NOTE — Progress Notes (Signed)
Daily Session Note  Patient Details  Name: [name] [name] MRN: [id number] Date of Birth: [birth year] Referring Provider:     CARDIAC REHAB [place] from 11/29/2017 in [place]  Referring Provider  Branch      Encounter Date: 12/11/2017  Check In: Session Check In - 12/11/17 0930      Check-In   Supervising physician immediately available to respond to emergencies  See telemetry face sheet for immediately available MD    Location  [facility]    Staff Present  Russella Dar, MS, EP, [facility], Exercise Physiologist;Kennis Wissmann Zachery Conch, Exercise Physiologist;Debra Wynetta Emery, RN, BSN    Medication changes reported      No    Fall or balance concerns reported     No    Warm-up and Cool-down  Performed as group-led instruction    Resistance Training Performed  Yes    VAD Patient?  No    PAD/SET Patient?  No      Pain Assessment   Currently in Pain?  No/denies    Pain Score  0-No pain    Multiple Pain Sites  No       Capillary Blood Glucose: No results found for this or any previous visit (from the past 24 hour(s)).    Social History   Tobacco Use  Smoking Status Former Smoker  . Types: Pipe  . Last attempt to quit: 06/07/2014  . Years since quitting: 3.5  Smokeless Tobacco Never Used    Goals Met:  Independence with exercise equipment Exercise tolerated well No report of cardiac concerns or symptoms Strength training completed today  Goals Unmet:  Not Applicable  Comments: Pt able to follow exercise prescription today without complaint.  Will continue to monitor for progression. Check out 10:30.   Dr. Kate Sable is Medical Director for [facility] Cardiac and Pulmonary Rehab.

## 2017-12-13 ENCOUNTER — Encounter (HOSPITAL_COMMUNITY): Payer: Medicare Other

## 2017-12-13 ENCOUNTER — Encounter (HOSPITAL_COMMUNITY)
Admission: RE | Admit: 2017-12-13 | Discharge: 2017-12-13 | Disposition: A | Discharge status: Home / Self Care | Payer: Medicare Other | Source: Ambulatory Visit | Attending: Cardiology | Admitting: Cardiology

## 2017-12-13 DIAGNOSIS — I213 ST elevation (STEMI) myocardial infarction of unspecified site: Secondary | ICD-10-CM

## 2017-12-13 DIAGNOSIS — Z955 Presence of coronary angioplasty implant and graft: Secondary | ICD-10-CM

## 2017-12-13 NOTE — Progress Notes (Signed)
Daily Session Note  Patient Details  Name: [name] [name] MRN: [id number] Date of Birth: 1945/08/24 Referring Provider:     CARDIAC REHAB PHASE II ORIENTATION from 11/29/2017 in [place]  Referring Provider  Branch      Encounter Date: 12/13/2017  Check In: Session Check In - 12/13/17 0930      Check-In   Supervising physician immediately available to respond to emergencies  See telemetry face sheet for immediately available MD    Location  [facility]    Staff Present  Russella Dar, MS, EP, [facility], Exercise Physiologist;Amanda Zachery Conch, Exercise Physiologist;Sundiata Ferrick Wynetta Emery, RN, BSN    Medication changes reported      No    Fall or balance concerns reported     No    Warm-up and Cool-down  Performed as group-led instruction    Resistance Training Performed  Yes    VAD Patient?  No    PAD/SET Patient?  No      Pain Assessment   Currently in Pain?  No/denies    Pain Score  0-No pain    Multiple Pain Sites  No       Capillary Blood Glucose: No results found for this or any previous visit (from the past 24 hour(s)).    Social History   Tobacco Use  Smoking Status Former Smoker  . Types: Pipe  . Last attempt to quit: 06/07/2014  . Years since quitting: 3.5  Smokeless Tobacco Never Used    Goals Met:  Independence with exercise equipment Exercise tolerated well No report of cardiac concerns or symptoms Strength training completed today  Goals Unmet:  Not Applicable  Comments: Pt able to follow exercise prescription today without complaint.  Will continue to monitor for progression.. Check out 1030.   Dr. Kate Sable is Medical Director for [facility] Cardiac and Pulmonary Rehab.

## 2017-12-15 ENCOUNTER — Encounter (HOSPITAL_COMMUNITY)
Admission: RE | Admit: 2017-12-15 | Discharge: 2017-12-15 | Disposition: A | Discharge status: Home / Self Care | Payer: Medicare Other | Source: Ambulatory Visit | Attending: Cardiology | Admitting: Cardiology

## 2017-12-15 ENCOUNTER — Other Ambulatory Visit: Payer: Self-pay

## 2017-12-15 ENCOUNTER — Ambulatory Visit: Payer: Medicare Other

## 2017-12-15 DIAGNOSIS — Z955 Presence of coronary angioplasty implant and graft: Secondary | ICD-10-CM | POA: Diagnosis not present

## 2017-12-15 DIAGNOSIS — I213 ST elevation (STEMI) myocardial infarction of unspecified site: Secondary | ICD-10-CM | POA: Diagnosis not present

## 2017-12-15 NOTE — Progress Notes (Signed)
Daily Session Note  Patient Details  Name: [name] [name] MRN: [id number] Date of Birth: 03/14/1946 Referring Provider:     CARDIAC REHAB PHASE II ORIENTATION from 11/29/2017 in [place]  Referring Provider  Branch      Encounter Date: 12/15/2017  Check In: Session Check In - 12/15/17 0930      Check-In   Supervising physician immediately available to respond to emergencies  See telemetry face sheet for immediately available MD    Location  [facility]    Staff Present  Russella Dar, MS, EP, [facility], Exercise Physiologist;Amanda Zachery Conch, Exercise Physiologist;Gailya Tauer Wynetta Emery, RN, BSN    Medication changes reported      No    Fall or balance concerns reported     No    Warm-up and Cool-down  Performed as group-led instruction    Resistance Training Performed  Yes    VAD Patient?  No    PAD/SET Patient?  No      Pain Assessment   Currently in Pain?  No/denies    Pain Score  0-No pain    Multiple Pain Sites  No       Capillary Blood Glucose: No results found for this or any previous visit (from the past 24 hour(s)).    Social History   Tobacco Use  Smoking Status Former Smoker  . Types: Pipe  . Last attempt to quit: 06/07/2014  . Years since quitting: 3.5  Smokeless Tobacco Never Used    Goals Met:  Independence with exercise equipment Exercise tolerated well No report of cardiac concerns or symptoms Strength training completed today  Goals Unmet:  Not Applicable  Comments: Pt able to follow exercise prescription today without complaint.  Will continue to monitor for progression. Check out 930.    Dr. Kate Sable is Medical Director for [facility] Cardiac and Pulmonary Rehab.

## 2017-12-15 NOTE — Patient Outreach (Signed)
[facility]) Care Management   12/15/2017  [name] [name] [birth year] [id number]  [name] [name] is an 72 y.o. male  Subjective:  Home visit complete. [name] [name] alert and oriented x 3. No complaints of pain or discomfort. Member in no acute distress at RNCM's arrival.  Objective:   Review of Systems  Constitutional: Negative.   HENT: Negative.   Eyes: Negative.   Respiratory: Positive for cough and sputum production.        Clear to light yellow sputum.  Gastrointestinal: Negative.   Genitourinary: Negative.   Musculoskeletal: Negative.   Skin: Positive for itching.       Due to small insect bites.  Neurological: Negative.     Physical Exam  Constitutional: He is oriented to person, place, and time. He appears well-developed and well-nourished.  Respiratory: Effort normal.  Neurological: He is alert and oriented to person, place, and time.  Psychiatric: He has a normal mood and affect. His behavior is normal. Judgment and thought content normal.    Encounter Medications:   Outpatient Encounter Medications as of 12/15/2017  Medication Sig Note  . ALPRAZolam (XANAX) 0.25 MG tablet Take 0.25 mg by mouth at bedtime as needed for sleep.   Marland Kitchen apixaban (ELIQUIS) 5 MG TABS tablet Take 1 tablet (5 mg total) by mouth 2 (two) times daily.   . Calcium Citrate-Vitamin D (CALCIUM CITRATE + D3 PO) Take 1 tablet by mouth daily.   . carvedilol (COREG) 6.25 MG tablet Take 1 tablet (6.25 mg total) by mouth 2 (two) times daily with a meal.   . clopidogrel (PLAVIX) 75 MG tablet Take 1 tablet (75 mg total) by mouth daily.   . furosemide (LASIX) 40 MG tablet Take 40 mg by mouth daily as needed for fluid or edema. 12/15/2017: Patient has not used in 30months.  [name] [name] 350 MG CAPS Take 350 mg by mouth daily.   . Lactobacillus (ACIDOPHILUS PROBIOTIC) 10 MG TABS Take 10 mg by mouth daily.    Marland Kitchen losartan (COZAAR) 25 MG tablet Take 1 tablet (25 mg total) by mouth daily.   . Multiple  Vitamins-Minerals (SENIOR VITES PO) Take 1 tablet by mouth daily.   . nitroGLYCERIN (NITROSTAT) 0.4 MG SL tablet Place 1 tablet (0.4 mg total) under the tongue every 5 (five) minutes as needed.   . NON FORMULARY Apply 1 application topically daily. Fluc2% Toe Nail Drops for Fungus   . rosuvastatin (CRESTOR) 40 MG tablet Take 1 tablet (40 mg total) by mouth daily.   . tamsulosin (FLOMAX) 0.4 MG CAPS capsule Take 0.4 mg by mouth daily.    Marland Kitchen tiotropium (SPIRIVA) 18 MCG inhalation capsule Place 1 capsule (18 mcg total) into inhaler and inhale daily.    No facility-administered encounter medications on file as of 12/15/2017.     Functional Status:   In your present state of health, do you have any difficulty performing the following activities: 11/02/2017 11/02/2017  Hearing? (No Data) Y  Comment Hearing Aid -  Vision? N -  Difficulty concentrating or making decisions? - -  Comment - -  Walking or climbing stairs? - -  Comment - -  Dressing or bathing? - -  Doing errands, shopping? - -  Some recent data might be hidden    Fall/Depression Screening:    Fall Risk  11/29/2017 10/19/2017 10/19/2017  Falls in the past year? No No No  Comment - Emmi Telephone Survey: data to providers prior to load -  Risk for fall due to : (No Data) - Other (Comment)  Risk for fall due to: Comment No history of falls - -   PHQ 2/9 Scores 12/15/2017 11/29/2017 11/02/2017 10/19/2017 02/06/2015 01/08/2015 12/11/2014  PHQ - 2 Score 0 1 0 1 0 0 0  PHQ- 9 Score - 7 - - - - -  Exception Documentation - Medical reason - - - - -    Assessment:   Routine home visit complete. [name] [name] reported doing well since last outreach. He remains compliant with treatment recommendations and attending Cardiac Rehab three times a week. Also reports attending the [facility] and participating in treadmill and reduced weight routines. No complaints of shortness of breath or chest discomfort.  He remains compliant with daily weights and has not  exceeded an overnight weight gain greater than two pounds. Reported weight as 188lbs.  Discussed care plan goals and member's care management needs. Anticipating discharge at next home visit.   PLAN Will follow up on next month for final assessment and discharge.    [place] [phone]

## 2017-12-18 ENCOUNTER — Encounter (HOSPITAL_COMMUNITY)
Admission: RE | Admit: 2017-12-18 | Discharge: 2017-12-18 | Disposition: A | Discharge status: Home / Self Care | Payer: Medicare Other | Source: Ambulatory Visit | Attending: Cardiology | Admitting: Cardiology

## 2017-12-18 DIAGNOSIS — I213 ST elevation (STEMI) myocardial infarction of unspecified site: Secondary | ICD-10-CM | POA: Diagnosis not present

## 2017-12-18 DIAGNOSIS — Z23 Encounter for immunization: Secondary | ICD-10-CM | POA: Diagnosis not present

## 2017-12-18 DIAGNOSIS — Z955 Presence of coronary angioplasty implant and graft: Secondary | ICD-10-CM

## 2017-12-18 NOTE — Progress Notes (Signed)
Daily Session Note  Patient Details  Name: [name] [name] MRN: [id number] Date of Birth: 23-Apr-1945 Referring Provider:     CARDIAC REHAB PHASE II ORIENTATION from 11/29/2017 in [place]  Referring Provider  Branch      Encounter Date: 12/18/2017  Check In: Session Check In - 12/18/17 0930      Check-In   Supervising physician immediately available to respond to emergencies  See telemetry face sheet for immediately available MD    Location  [facility]    Staff Present  Benay Pike, Exercise Physiologist;Ahmoni Edge Wynetta Emery, RN, BSN    Medication changes reported      No    Fall or balance concerns reported     No    Warm-up and Cool-down  Performed as group-led instruction    Resistance Training Performed  Yes    VAD Patient?  No    PAD/SET Patient?  No      Pain Assessment   Currently in Pain?  No/denies    Pain Score  0-No pain    Multiple Pain Sites  No       Capillary Blood Glucose: No results found for this or any previous visit (from the past 24 hour(s)).    Social History   Tobacco Use  Smoking Status Former Smoker  . Types: Pipe  . Last attempt to quit: 06/07/2014  . Years since quitting: 3.5  Smokeless Tobacco Never Used    Goals Met:  Independence with exercise equipment Exercise tolerated well No report of cardiac concerns or symptoms Strength training completed today  Goals Unmet:  Not Applicable  Comments: Check out 1030.   Dr. Kate Sable is Medical Director for [facility] Cardiac and Pulmonary Rehab.

## 2017-12-20 ENCOUNTER — Encounter (HOSPITAL_COMMUNITY)
Admission: RE | Admit: 2017-12-20 | Discharge: 2017-12-20 | Disposition: A | Discharge status: Home / Self Care | Payer: Medicare Other | Source: Ambulatory Visit | Attending: Cardiology | Admitting: Cardiology

## 2017-12-20 DIAGNOSIS — I213 ST elevation (STEMI) myocardial infarction of unspecified site: Secondary | ICD-10-CM | POA: Insufficient documentation

## 2017-12-20 DIAGNOSIS — Z955 Presence of coronary angioplasty implant and graft: Secondary | ICD-10-CM | POA: Insufficient documentation

## 2017-12-20 NOTE — Progress Notes (Signed)
Daily Session Note  Patient Details  Name: [name] [name] MRN: [id number] Date of Birth: Aug 13, 1945 Referring Provider:     CARDIAC REHAB PHASE II ORIENTATION from 11/29/2017 in [place]  Referring Provider  Branch      Encounter Date: 12/20/2017  Check In: Session Check In - 12/20/17 0930      Check-In   Supervising physician immediately available to respond to emergencies  See telemetry face sheet for immediately available MD    Location  [facility]    Staff Present  Benay Pike, Exercise Physiologist;Debra Wynetta Emery, RN, BSN    Medication changes reported      No    Fall or balance concerns reported     No    Warm-up and Cool-down  Performed as group-led instruction    Resistance Training Performed  Yes    VAD Patient?  No    PAD/SET Patient?  No      Pain Assessment   Currently in Pain?  No/denies    Pain Score  0-No pain    Multiple Pain Sites  No       Capillary Blood Glucose: No results found for this or any previous visit (from the past 24 hour(s)).    Social History   Tobacco Use  Smoking Status Former Smoker  . Types: Pipe  . Last attempt to quit: 06/07/2014  . Years since quitting: 3.5  Smokeless Tobacco Never Used    Goals Met:  Independence with exercise equipment Exercise tolerated well No report of cardiac concerns or symptoms Strength training completed today  Goals Unmet:  Not Applicable  Comments: Pt able to follow exercise prescription today without complaint.  Will continue to monitor for progression. Check out 10:30.   Dr. Kate Sable is Medical Director for [facility] Cardiac and Pulmonary Rehab.

## 2017-12-22 ENCOUNTER — Encounter (HOSPITAL_COMMUNITY)
Admission: RE | Admit: 2017-12-22 | Discharge: 2017-12-22 | Disposition: A | Discharge status: Home / Self Care | Payer: Medicare Other | Source: Ambulatory Visit | Attending: Cardiology | Admitting: Cardiology

## 2017-12-22 DIAGNOSIS — Z955 Presence of coronary angioplasty implant and graft: Secondary | ICD-10-CM

## 2017-12-22 DIAGNOSIS — I213 ST elevation (STEMI) myocardial infarction of unspecified site: Secondary | ICD-10-CM

## 2017-12-22 NOTE — Progress Notes (Signed)
Daily Session Note  Patient Details  Name: RAMONDO DIETZE MRN: [id number] Date of Birth: 04-Feb-1946 Referring Provider:     CARDIAC REHAB PHASE II ORIENTATION from 11/29/2017 in [place]  Referring Provider  Branch      Encounter Date: 12/22/2017  Check In: Session Check In - 12/22/17 0930      Check-In   Supervising physician immediately available to respond to emergencies  See telemetry face sheet for immediately available MD    Location  [facility]    Staff Present  Benay Pike, Exercise Physiologist;Tramel [place] Wynetta Emery, RN, BSN    Medication changes reported      No    Fall or balance concerns reported     No    Warm-up and Cool-down  Performed as group-led instruction    Resistance Training Performed  Yes    VAD Patient?  No    PAD/SET Patient?  No      Pain Assessment   Currently in Pain?  No/denies    Pain Score  0-No pain    Multiple Pain Sites  No       Capillary Blood Glucose: No results found for this or any previous visit (from the past 24 hour(s)).    Social History   Tobacco Use  Smoking Status Former Smoker  . Types: Pipe  . Last attempt to quit: 06/07/2014  . Years since quitting: 3.5  Smokeless Tobacco Never Used    Goals Met:  Independence with exercise equipment Exercise tolerated well No report of cardiac concerns or symptoms Strength training completed today  Goals Unmet:  Not Applicable  Comments: Pt able to follow exercise prescription today without complaint.  Will continue to monitor for progression. Check out 1030.   Dr. Kate Sable is Medical Director for [facility] Cardiac and Pulmonary Rehab.

## 2017-12-25 ENCOUNTER — Encounter (HOSPITAL_COMMUNITY)
Admission: RE | Admit: 2017-12-25 | Discharge: 2017-12-25 | Disposition: A | Discharge status: Home / Self Care | Payer: Medicare Other | Source: Ambulatory Visit | Attending: Cardiology | Admitting: Cardiology

## 2017-12-25 DIAGNOSIS — M4317 Spondylolisthesis, lumbosacral region: Secondary | ICD-10-CM | POA: Diagnosis not present

## 2017-12-25 DIAGNOSIS — I213 ST elevation (STEMI) myocardial infarction of unspecified site: Secondary | ICD-10-CM

## 2017-12-25 DIAGNOSIS — Z955 Presence of coronary angioplasty implant and graft: Secondary | ICD-10-CM | POA: Diagnosis not present

## 2017-12-25 NOTE — Progress Notes (Signed)
Daily Session Note  Patient Details  Name: KEIGHAN AMEZCUA MRN: [id number] Date of Birth: [birth year] Referring Provider:     CARDIAC REHAB PHASE II ORIENTATION from 11/29/2017 in [place]  Referring Provider  Branch      Encounter Date: 12/25/2017  Check In: Session Check In - 12/25/17 0930      Check-In   Supervising physician immediately available to respond to emergencies  See telemetry face sheet for immediately available MD    Location  [facility]    Staff Present  Benay Pike, Exercise Physiologist;Abdur Hoglund Wynetta Emery, RN, BSN    Medication changes reported      No    Fall or balance concerns reported     No    Warm-up and Cool-down  Performed as group-led instruction    Resistance Training Performed  Yes    VAD Patient?  No    PAD/SET Patient?  No      Pain Assessment   Currently in Pain?  No/denies    Pain Score  0-No pain    Multiple Pain Sites  No       Capillary Blood Glucose: No results found for this or any previous visit (from the past 24 hour(s)).    Social History   Tobacco Use  Smoking Status Former Smoker  . Types: Pipe  . Last attempt to quit: 06/07/2014  . Years since quitting: 3.5  Smokeless Tobacco Never Used    Goals Met:  Independence with exercise equipment Exercise tolerated well No report of cardiac concerns or symptoms Strength training completed today  Goals Unmet:  Not Applicable  Comments: Pt able to follow exercise prescription today without complaint.  Will continue to monitor for progression. Check out 1030.   Dr. Kate Sable is Medical Director for [facility] Card[facility] and Pulmonary Rehab.

## 2017-12-25 NOTE — Progress Notes (Signed)
Cardiac Individual Treatment Plan  Patient Details  Name: [name] [name] MRN: [id number] Date of Birth: Nov 12, 1945 Referring Provider:     CARDIAC REHAB PHASE II ORIENTATION from 11/29/2017 in [place]  Referring Provider  Branch      Initial Encounter Date:    CARDIAC REHAB PHASE II ORIENTATION from 11/29/2017 in [place]  Date  11/29/17      Visit Diagnosis: ST elevation myocardial infarction (STEMI), unspecified artery [facility])  Status post coronary artery stent placement  Patient's Home Medications on Admission:  Current Outpatient Medications:  .  ALPRAZolam (XANAX) 0.25 MG tablet, Take 0.25 mg by mouth at bedtime as needed for sleep., Disp: , Rfl: 5 .  apixaban (ELIQUIS) 5 MG TABS tablet, Take 1 tablet (5 mg total) by mouth 2 (two) times daily., Disp: 180 tablet, Rfl: 3 .  Calcium Citrate-Vitamin D (CALCIUM CITRATE + D3 PO), Take 1 tablet by mouth daily., Disp: , Rfl:  .  carvedilol (COREG) 6.25 MG tablet, Take 1 tablet (6.25 mg total) by mouth 2 (two) times daily with a meal., Disp: 180 tablet, Rfl: 3 .  clopidogrel (PLAVIX) 75 MG tablet, Take 1 tablet (75 mg total) by mouth daily., Disp: 90 tablet, Rfl: 2 .  furosemide (LASIX) 40 MG tablet, Take 40 mg by mouth daily as needed for fluid or edema., Disp: , Rfl:  .  Krill Oil 350 MG CAPS, Take 350 mg by mouth daily., Disp: , Rfl:  .  Lactobacillus (ACIDOPHILUS PROBIOTIC) 10 MG TABS, Take 10 mg by mouth daily. , Disp: , Rfl:  .  losartan (COZAAR) 25 MG tablet, Take 1 tablet (25 mg total) by mouth daily., Disp: 90 tablet, Rfl: 3 .  Multiple Vitamins-Minerals (SENIOR VITES PO), Take 1 tablet by mouth daily., Disp: , Rfl:  .  nitroGLYCERIN (NITROSTAT) 0.4 MG SL tablet, Place 1 tablet (0.4 mg total) under the tongue every 5 (five) minutes as needed., Disp: 25 tablet, Rfl: 2 .  NON FORMULARY, Apply 1 application topically daily. Fluc2% Toe Nail Drops for Fungus, Disp: , Rfl:  .   rosuvastatin (CRESTOR) 40 MG tablet, Take 1 tablet (40 mg total) by mouth daily., Disp: 90 tablet, Rfl: 1 .  tamsulosin (FLOMAX) 0.4 MG CAPS capsule, Take 0.4 mg by mouth daily. , Disp: , Rfl:  .  tiotropium (SPIRIVA) 18 MCG inhalation capsule, Place 1 capsule (18 mcg total) into inhaler and inhale daily., Disp: 30 capsule, Rfl: 1  Past Medical History: Past Medical History:  Diagnosis Date  . Acute systolic heart failure ([place])   . COPD (chronic obstructive pulmonary disease) ([place])   . Hyperlipidemia   . Hypertension   . Low serum testosterone level   . PAF (paroxysmal atrial fibrillation) ([place])   . Pneumonia 05/2014  . Shortness of breath dyspnea   . STEMI (ST elevation myocardial infarction) ([place])    09/27/17 PCI/DES x1 mLAD, PTCA of the diag branch. EF 30% Lifevest at discharge.   . Urinary frequency     Tobacco Use: Social History   Tobacco Use  Smoking Status Former Smoker  . Types: Pipe  . Last attempt to quit: 06/07/2014  . Years since quitting: 3.5  Smokeless Tobacco Never Used    Labs: Recent Review Flowsheet Data    Labs for ITP Cardiac and Pulmonary Rehab Latest Ref Rng & Units 06/26/2014 06/28/2014 09/27/2017   PHART 7.350 - 7.450 7.491(H) 7.450 -   PCO2ART 35.0 - 45.0 mmHg 29.5(L) 34.2(L) -  HCO3 20.0 - 24.0 mEq/L 22.0 23.4 -   TCO2 22 - 32 mmol/L 22.8 24.5 22   ACIDBASEDEF 0.0 - 2.0 mmol/L 0.7 0.1 -   O2SAT % 91.5 95.6 -      Capillary Blood Glucose: Lab Results  Component Value Date   GLUCAP 114 (H) 06/07/2014     Exercise Target Goals: Exercise Program Goal: Individual exercise prescription set using results from initial 6 min walk test and THRR while considering  patient's activity barriers and safety.   Exercise Prescription Goal: Starting with aerobic activity 30 plus minutes a day, 3 days per week for initial exercise prescription. Provide home exercise prescription and guidelines that participant acknowledges understanding prior to  discharge.  Activity Barriers & Risk Stratification: Activity Barriers & Cardiac Risk Stratification - 11/29/17 1344      Activity Barriers & Cardiac Risk Stratification   Activity Barriers  Back Problems;Shortness of Breath   spinal fusion; COPD    Cardiac Risk Stratification  High       6 Minute Walk: 6 Minute Walk    Row Name 11/29/17 1343         6 Minute Walk   Phase  Initial     Distance  1700 feet     Walk Time  6 minutes     # of Rest Breaks  0     MPH  3.22     METS  3.47     RPE  9     Perceived Dyspnea   11     VO2 Peak  12.1     Symptoms  No     Resting HR  58 bpm     Resting BP  110/52     Resting Oxygen Saturation   97 %     Exercise Oxygen Saturation  during 6 min walk  94 %     Max Ex. HR  84 bpm     Max Ex. BP  126/60     2 Minute Post BP  110/58        Oxygen Initial Assessment:   Oxygen Re-Evaluation:   Oxygen Discharge (Final Oxygen Re-Evaluation):   Initial Exercise Prescription: Initial Exercise Prescription - 11/29/17 1300      Date of Initial Exercise RX and Referring Provider   Date  11/29/17    Referring Provider  Branch      Treadmill   MPH  2.5    Grade  0    Minutes  17    METs  2.91      Recumbant Elliptical   Level  1    RPM  49    Watts  55    Minutes  17    METs  4      Prescription Details   Frequency (times per week)  3    Duration  Progress to 30 minutes of continuous aerobic without signs/symptoms of physical distress      Intensity   THRR 40-80% of Max Heartrate  [phone]    Ratings of Perceived Exertion  11-13    Perceived Dyspnea  0-4      Progression   Progression  Continue to progress workloads to maintain intensity without signs/symptoms of physical distress.      Resistance Training   Training Prescription  Yes    Weight  1    Reps  10-15       Perform Capillary Blood Glucose checks as needed.  Exercise Prescription Changes:  Exercise Prescription Changes    Row Name 12/18/17 1400              Response to Exercise   Blood Pressure (Admit)  98/62       Blood Pressure (Exercise)  90/60       Blood Pressure (Exit)  106/50       Heart Rate (Admit)  55 bpm       Heart Rate (Exercise)  96 bpm       Heart Rate (Exit)  61 bpm       Rating of Perceived Exertion (Exercise)  12       Duration  Progress to 30 minutes of  aerobic without signs/symptoms of physical distress       Intensity  THRR unchanged         Progression   Progression  Continue to progress workloads to maintain intensity without signs/symptoms of physical distress.       Average METs  4.2         Resistance Training   Training Prescription  Yes       Weight  2       Reps  10-15         Treadmill   MPH  3       Grade  1       Minutes  17       METs  3.81         Recumbant Elliptical   Level  2       RPM  62       Watts  86       Minutes  22       METs  4.6         Home Exercise Plan   Plans to continue exercise at  [employer] (comment) [employer] - cardio and strength training        Frequency  Add 2 additional days to program exercise sessions.       Initial Home Exercises Provided  11/29/17          Exercise Comments:  Exercise Comments    Row Name 12/04/17 0757 12/25/17 1425         Exercise Comments  Patient has just started. He is doing well so far. Will continue to monitor progress and advance accordingly.   Patient has worked hard in the program and tolerated all increases with ease. He is eager to progress. We will continue to monitor and progress as we see fit.          Exercise Goals and Review:  Exercise Goals    Row Name 11/29/17 1436             Exercise Goals   Increase Physical Activity  Yes       Intervention  Develop an individualized exercise prescription for aerobic and resistive training based on initial evaluation findings, risk stratification, comorbidities and participant's personal goals.       Increase Strength and Stamina  Yes        Intervention  Provide advice, education, support and counseling about physical activity/exercise needs.;Develop an individualized exercise prescription for aerobic and resistive training based on initial evaluation findings, risk stratification, comorbidities and participant's personal goals.       Expected Outcomes  Short Term: Increase workloads from initial exercise prescription for resistance, speed, and METs.;Short Term: Perform resistance training exercises routinely during rehab and add in resistance training at home;Long Term: Improve  cardiorespiratory fitness, muscular endurance and strength as measured by increased METs and functional capacity (6MWT)       Able to understand and use rate of perceived exertion (RPE) scale  Yes       Intervention  Provide education and explanation on how to use RPE scale       Expected Outcomes  Short Term: Able to use RPE daily in rehab to express subjective intensity level       Knowledge and understanding of Target Heart Rate Range (THRR)  Yes       Intervention  Provide education and explanation of THRR including how the numbers were predicted and where they are located for reference       Expected Outcomes  Short Term: Able to state/look up THRR;Long Term: Able to use THRR to govern intensity when exercising independently       Able to check pulse independently  Yes       Intervention  Provide education and demonstration on how to check pulse in carotid and radial arteries.       Expected Outcomes  Short Term: Able to explain why pulse checking is important during independent exercise;Long Term: Able to check pulse independently and accurately       Understanding of Exercise Prescription  Yes       Intervention  Provide education, explanation, and written materials on patient's individual exercise prescription       Expected Outcomes  Short Term: Able to explain program exercise prescription;Long Term: Able to explain home exercise prescription to exercise  independently          Exercise Goals Re-Evaluation : Exercise Goals Re-Evaluation    Row Name 12/04/17 0753 12/25/17 1423           Exercise Goal Re-Evaluation   Exercise Goals Review  Increase Physical Activity;Able to understand and use rate of perceived exertion (RPE) scale;Knowledge and understanding of Target Heart Rate Range (THRR);Able to check pulse independently;Understanding of Exercise Prescription  Increase Physical Activity;Able to understand and use rate of perceived exertion (RPE) scale;Knowledge and understanding of Target Heart Rate Range (THRR);Able to check pulse independently;Understanding of Exercise Prescription      Comments  Patient has just started program. He has been to 2 visits. Patient is doing well in classes so far and we will continue to monitor for progress. We will progress his his equipment and weights every 2 weeks.   Paitent works hard in rehab everyday. He has had 12 sessoins so far. He is determined to get better and back to the same energy level he had before his STEMI. He has increased his overall MET level and weights.       Expected Outcomes  Get back to road trips and boogie boarding.   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           Discharge Exercise Prescription (Final Exercise Prescription Changes): Exercise Prescription Changes - 12/18/17 1400      Response to Exercise   Blood Pressure (Admit)  98/62    Blood Pressure (Exercise)  90/60    Blood Pressure (Exit)  106/50    Heart Rate (Admit)  55 bpm    Heart Rate (Exercise)  96 bpm    Heart Rate (Exit)  61 bpm    Rating of Perceived Exertion (Exercise)  12    Duration  Progress to 30 minutes of  aerobic without signs/symptoms of physical distress    Intensity  THRR unchanged      Progression   Progression  Continue to progress workloads to maintain intensity without signs/symptoms of physical distress.    Average METs  4.2      Resistance Training    Training Prescription  Yes    Weight  2    Reps  10-15      Treadmill   MPH  3    Grade  1    Minutes  17    METs  3.81      Recumbant Elliptical   Level  2    RPM  62    Watts  86    Minutes  22    METs  4.6      Home Exercise Plan   Plans to continue exercise at  [employer] (comment)   [employer] - cardio and strength training    Frequency  Add 2 additional days to program exercise sessions.    Initial Home Exercises Provided  11/29/17       Nutrition:  Target Goals: Understanding of nutrition guidelines, daily intake of sodium '1500mg'$ , cholesterol '200mg'$ , calories 30% from fat and 7% or less from saturated fats, daily to have 5 or more servings of fruits and vegetables.  Biometrics: Pre Biometrics - 11/29/17 1346      Pre Biometrics   Height  6' (1.829 m)    Waist Circumference  36 inches    Hip Circumference  40 inches    Waist to Hip Ratio  0.9 %    BMI (Calculated)  26.37    Triceps Skinfold  5 mm    % Body Fat  20.6 %    Grip Strength  28.6 kg    Flexibility  0 in    Single Leg Stand  5.86 seconds        Nutrition Therapy Plan and Nutrition Goals: Nutrition Therapy & Goals - 12/21/17 1552      Personal Nutrition Goals   Nutrition Goal  For heart healthy choices add >50% of whole grains, make half their plate fruits and vegetables. Discuss the difference between starchy vegetables and leafy greens, and how leafy vegetables provide fiber, helps maintain healthy weight, helps control blood glucose, and lowers cholesterol.  Discuss purchasing fresh or frozen vegetable to reduce sodium and not to add grease, fat or sugar. Consume <18oz of red meat per week. Consume lean cuts of meats and very little of meats high in sodium and nitrates such as pork and lunch meats. Discussed portion control for all food groups.      Comments  Patient met with RD today 12/21/17.      Intervention Plan   Intervention  Nutrition handout(s) given to patient.    Expected  Outcomes  Short Term Goal: Understand basic principles of dietary content, such as calories, fat, sodium, cholesterol and nutrients.       Nutrition Assessments: Nutrition Assessments - 11/29/17 1523      MEDFICTS Scores   Pre Score  27       Nutrition Goals Re-Evaluation: Nutrition Goals Re-Evaluation    [place] Name 12/25/17 1455             Goals   Current Weight  192 lb (87.1 kg)       Nutrition Goal  For heart healthy choices add >50% of whole grains, make half their plate fruits and vegetables. Discuss the difference between starchy vegetables and leafy greens, and how leafy vegetables provide fiber, helps maintain healthy  weight, helps control blood glucose, and lowers cholesterol.  Discuss purchasing fresh or frozen vegetable to reduce sodium and not to add grease, fat or sugar. Consume <18oz of red meat per week. Consume lean cuts of meats and very little of meats high in sodium and nitrates such as pork and lunch meats. Discussed portion control for all food groups.         Comment  Patient has lost 2 lbs since his orientation visit. He has met with RD and says he is trying to follow a low sodium, low fat, heart healthy diet. Will continue to monitor for progress.           Nutrition Goals Discharge (Final Nutrition Goals Re-Evaluation): Nutrition Goals Re-Evaluation - 12/25/17 1455      Goals   Current Weight  192 lb (87.1 kg)    Nutrition Goal  For heart healthy choices add >50% of whole grains, make half their plate fruits and vegetables. Discuss the difference between starchy vegetables and leafy greens, and how leafy vegetables provide fiber, helps maintain healthy weight, helps control blood glucose, and lowers cholesterol.  Discuss purchasing fresh or frozen vegetable to reduce sodium and not to add grease, fat or sugar. Consume <18oz of red meat per week. Consume lean cuts of meats and very little of meats high in sodium and nitrates such as pork and lunch meats.  Discussed portion control for all food groups.      Comment  Patient has lost 2 lbs since his orientation visit. He has met with RD and says he is trying to follow a low sodium, low fat, heart healthy diet. Will continue to monitor for progress.        Psychosocial: Target Goals: Acknowledge presence or absence of significant depression and/or stress, maximize coping skills, provide positive support system. Participant is able to verbalize types and ability to use techniques and skills needed for reducing stress and depression.  Initial Review & Psychosocial Screening: Initial Psych Review & Screening - 11/29/17 1518      Initial Review   Current issues with  Current Depression;Current Stress Concerns    Source of Stress Concerns  Family   His recent health issues     Family Dynamics   Good Support System?  Yes      Barriers   Psychosocial barriers to participate in program  The patient should benefit from training in stress management and relaxation.;Psychosocial barriers identified (see note)      Screening Interventions   Interventions  Encouraged to exercise    Expected Outcomes  Short Term goal: Identification and review with participant of any Quality of Life or Depression concerns found by scoring the questionnaire.;Long Term goal: The participant improves quality of Life and PHQ9 Scores as seen by post scores and/or verbalization of changes       Quality of Life Scores: Quality of Life - 11/29/17 1408      Quality of Life   Select  Quality of Life      Quality of Life Scores   Health/Function Pre  19.73 %    Socioeconomic Pre  26.57 %    Psych/Spiritual Pre  15.93 %    Family Pre  24.2 %    GLOBAL Pre  19.73 %      Scores of 19 and below usually indicate a poorer quality of life in these areas.  A difference of  2-3 points is a clinically meaningful difference.  A difference of 2-3 points  in the total score of the Quality of Life Index has been associated with  significant improvement in overall quality of life, self-image, physical symptoms, and general health in studies assessing change in quality of life.  PHQ-9: Recent Review Flowsheet Data    Depression screen [facility] 2/9 12/15/2017 11/29/2017 11/02/2017 10/19/2017 02/06/2015   Decreased Interest 0 0 0 0 0   Down, Depressed, Hopeless 0 1 0 1 0   PHQ - 2 Score 0 1 0 1 0   Altered sleeping - 1 - - -   Tired, decreased energy - 1 - - -   Change in appetite - 0 - - -   Feeling bad or failure about yourself  - 1 - - -   Trouble concentrating - 1 - - -   Moving slowly or fidgety/restless - 1 - - -   Suicidal thoughts - 1 - - -   PHQ-9 Score - 7 - - -   Difficult doing work/chores - Somewhat difficult - - -     Interpretation of Total Score  Total Score Depression Severity:  1-4 = Minimal depression, 5-9 = Mild depression, 10-14 = Moderate depression, 15-19 = Moderately severe depression, 20-27 = Severe depression   Psychosocial Evaluation and Intervention: Psychosocial Evaluation - 11/29/17 1522      Psychosocial Evaluation & Interventions   Interventions  Encouraged to exercise with the program and follow exercise prescription    Continue Psychosocial Services   Follow up required by staff       Psychosocial Re-Evaluation: Psychosocial Re-Evaluation    [place] Name 12/25/17 1500             Psychosocial Re-Evaluation   Current issues with  None Identified       Comments  Patient initial QOL score was 19.73 and his PHQ-9 score was 7 with no issues identified.        Expected Outcomes  Patient will have no psychosocial issues identified at discharge.        Interventions  Stress management education;Encouraged to attend Cardiac Rehabilitation for the exercise;Relaxation education       Continue Psychosocial Services   No Follow up required          Psychosocial Discharge (Final Psychosocial Re-Evaluation): Psychosocial Re-Evaluation - 12/25/17 1500      Psychosocial Re-Evaluation    Current issues with  None Identified    Comments  Patient initial QOL score was 19.73 and his PHQ-9 score was 7 with no issues identified.     Expected Outcomes  Patient will have no psychosocial issues identified at discharge.     Interventions  Stress management education;Encouraged to attend Cardiac Rehabilitation for the exercise;Relaxation education    Continue Psychosocial Services   No Follow up required       Vocational Rehabilitation: Provide vocational rehab assistance to qualifying candidates.   Vocational Rehab Evaluation & Intervention: Vocational Rehab - 11/29/17 1524      Initial Vocational Rehab Evaluation & Intervention   Assessment shows need for Vocational Rehabilitation  No       Education: Education Goals: Education classes will be provided on a weekly basis, covering required topics. Participant will state understanding/return demonstration of topics presented.  Learning Barriers/Preferences: Learning Barriers/Preferences - 11/29/17 1523      Learning Barriers/Preferences   Learning Barriers  None    Learning Preferences  Verbal Instruction;Individual Instruction;Skilled Demonstration;Group Instruction       Education Topics: Hypertension, Hypertension Reduction -Define heart disease and  high blood pressure. Discus how high blood pressure affects the body and ways to reduce high blood pressure.   Exercise and Your Heart -Discuss why it is important to exercise, the FITT principles of exercise, normal and abnormal responses to exercise, and how to exercise safely.   Angina -Discuss definition of angina, causes of angina, treatment of angina, and how to decrease risk of having angina.   Cardiac Medications -Review what the following cardiac medications are used for, how they affect the body, and side effects that may occur when taking the medications.  Medications include Aspirin, Beta blockers, calcium channel blockers, ACE Inhibitors, angiotensin  receptor blockers, diuretics, digoxin, and antihyperlipidemics.   Congestive Heart Failure -Discuss the definition of CHF, how to live with CHF, the signs and symptoms of CHF, and how keep track of weight and sodium intake.   Heart Disease and Intimacy -Discus the effect sexual activity has on the heart, how changes occur during intimacy as we age, and safety during sexual activity.   Smoking Cessation / COPD -Discuss different methods to quit smoking, the health benefits of quitting smoking, and the definition of COPD.   CARDIAC REHAB PHASE II EXERCISE from 12/20/2017 in [place]  Date  12/06/17  Educator  Etheleen Mayhew  Instruction Review Code  2- Demonstrated Understanding      Nutrition I: Fats -Discuss the types of cholesterol, what cholesterol does to the heart, and how cholesterol levels can be controlled.   CARDIAC REHAB PHASE II EXERCISE from 12/20/2017 in [place]  Date  12/13/17  Educator  D. Coad  Instruction Review Code  2- Demonstrated Understanding      Nutrition II: Labels -Discuss the different components of food labels and how to read food label   [place] from 12/20/2017 in [place]  Date  12/20/17  Educator  Etheleen Mayhew  Instruction Review Code  2- Demonstrated Understanding      Heart Parts/Heart Disease and PAD -Discuss the anatomy of the heart, the pathway of blood circulation through the heart, and these are affected by heart disease.   Stress I: Signs and Symptoms -Discuss the causes of stress, how stress may lead to anxiety and depression, and ways to limit stress.   Stress II: Relaxation -Discuss different types of relaxation techniques to limit stress.   Warning Signs of Stroke / TIA -Discuss definition of a stroke, what the signs and symptoms are of a stroke, and how to identify when someone is having stroke.   Knowledge Questionnaire  Score: Knowledge Questionnaire Score - 11/29/17 1523      Knowledge Questionnaire Score   Pre Score  23/24       Core Components/Risk Factors/Patient Goals at Admission: Personal Goals and Risk Factors at Admission - 11/29/17 1524      Core Components/Risk Factors/Patient Goals on Admission    Weight Management  Weight Maintenance    Personal Goal Other  Yes    Personal Goal  Increase physical and menatal energy to before having MI    Intervention  Exercise 3xweek in [facility] and supplement with home exercise 2 x week.     Expected Outcomes  Reach personal goals.       Core Components/Risk Factors/Patient Goals Review:  Goals and Risk Factor Review    Row Name 12/25/17 1456             Core Components/Risk Factors/Patient Goals Review   Personal Goals Review  Weight Management/Obesity Increase physical and mental energy to before MI; get back to road trips/boogie boarding. Get back to work.        Review  Patient has completed 12 sessions losing 2 lbs since his initial visit. He is doing well in the program with progression. He is exercising outside of CR and wants to progress more. He says he is doing more on his own than what he does in rehab.  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 He does feel like he is making progress. Will continue to monitor for progress.        Expected Outcomes  Patient will continue to attend sessions and complete the program meeting his personal goals.           Core Components/Risk Factors/Patient Goals at Discharge (Final Review):  Goals and Risk Factor Review - 12/25/17 1456      Core Components/Risk Factors/Patient Goals Review   Personal Goals Review  Weight Management/Obesity   Increase physical and mental energy to before MI; get back to road trips/boogie boarding. Get back to work.    Review  Patient has completed 12 sessions losing 2 lbs since his  initial visit. He is doing well in the program with progression. He is exercising outside of CR and wants to progress more. He says he is doing more on his own than what he does in rehab.  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 He does feel like he is making progress. Will continue to monitor for progress.     Expected Outcomes  Patient will continue to attend sessions and complete the program meeting his personal goals.        ITP Comments: ITP Comments    Row Name 11/29/17 1514 12/04/17 1316         ITP Comments  Patient had back surgery May 2019. He is eager to get started and so that he can increase his physical and mental energy to before MI  Patient is new to program completing 3 sessions. Will continue to monitor for progress.          Comments: ITP REVIEW Patient doing well in the program. Will continue to monitor for progress.

## 2017-12-27 ENCOUNTER — Encounter (HOSPITAL_COMMUNITY)
Admission: RE | Admit: 2017-12-27 | Discharge: 2017-12-27 | Disposition: A | Discharge status: Home / Self Care | Payer: Medicare Other | Source: Ambulatory Visit | Attending: Cardiology | Admitting: Cardiology

## 2017-12-27 DIAGNOSIS — I213 ST elevation (STEMI) myocardial infarction of unspecified site: Secondary | ICD-10-CM

## 2017-12-27 DIAGNOSIS — Z955 Presence of coronary angioplasty implant and graft: Secondary | ICD-10-CM | POA: Diagnosis not present

## 2017-12-27 NOTE — Progress Notes (Signed)
Daily Session Note  Patient Details  Name: [name] [name] MRN: [id number] Date of Birth: 03-Jan-1946 Referring Provider:     CARDIAC REHAB PHASE II ORIENTATION from 11/29/2017 in [place]  Referring Provider  Branch      Encounter Date: 12/27/2017  Check In: Session Check In - 12/27/17 0930      Check-In   Supervising physician immediately available to respond to emergencies  See telemetry face sheet for immediately available MD    Location  [facility]    Staff Present  Russella Dar, MS, EP, [facility], Exercise Physiologist;Johnika Escareno Zachery Conch, Exercise Physiologist;Debra Wynetta Emery, RN, BSN    Medication changes reported      No    Fall or balance concerns reported     No    Warm-up and Cool-down  Performed as group-led instruction    Resistance Training Performed  Yes    VAD Patient?  No    PAD/SET Patient?  No      Pain Assessment   Currently in Pain?  No/denies    Pain Score  0-No pain    Multiple Pain Sites  No       Capillary Blood Glucose: No results found for this or any previous visit (from the past 24 hour(s)).    Social History   Tobacco Use  Smoking Status Former Smoker  . Types: Pipe  . Last attempt to quit: 06/07/2014  . Years since quitting: 3.5  Smokeless Tobacco Never Used    Goals Met:  Independence with exercise equipment Exercise tolerated well No report of cardiac concerns or symptoms Strength training completed today  Goals Unmet:  Not Applicable  Comments: Pt able to follow exercise prescription today without complaint.  Will continue to monitor for progression. Check out 1030.    Dr. Kate Sable is Medical Director for [facility] Cardiac and Pulmonary Rehab.

## 2017-12-29 ENCOUNTER — Encounter (HOSPITAL_COMMUNITY)
Admission: RE | Admit: 2017-12-29 | Discharge: 2017-12-29 | Disposition: A | Discharge status: Home / Self Care | Payer: Medicare Other | Source: Ambulatory Visit | Attending: Cardiology | Admitting: Cardiology

## 2017-12-29 DIAGNOSIS — I213 ST elevation (STEMI) myocardial infarction of unspecified site: Secondary | ICD-10-CM | POA: Diagnosis not present

## 2017-12-29 DIAGNOSIS — Z955 Presence of coronary angioplasty implant and graft: Secondary | ICD-10-CM | POA: Diagnosis not present

## 2017-12-29 NOTE — Progress Notes (Signed)
Daily Session Note  Patient Details  Name: RANDIE TALLARICO MRN: [id number] Date of Birth: 24-Jan-1946 Referring Provider:     CARDIAC REHAB PHASE II ORIENTATION from 11/29/2017 in [place]  Referring Provider  Branch      Encounter Date: 12/29/2017  Check In: Session Check In - 12/29/17 0930      Check-In   Supervising physician immediately available to respond to emergencies  See telemetry face sheet for immediately available MD    Location  [facility]    Staff Present  Benay Pike, Exercise Physiologist;Debra Wynetta Emery, RN, BSN    Medication changes reported      No    Fall or balance concerns reported     No    Warm-up and Cool-down  Performed as group-led instruction    Resistance Training Performed  Yes    VAD Patient?  No    PAD/SET Patient?  No      Pain Assessment   Currently in Pain?  No/denies    Pain Score  0-No pain    Multiple Pain Sites  No       Capillary Blood Glucose: No results found for this or any previous visit (from the past 24 hour(s)).    Social History   Tobacco Use  Smoking Status Former Smoker  . Types: Pipe  . Last attempt to quit: 06/07/2014  . Years since quitting: 3.5  Smokeless Tobacco Never Used    Goals Met:  Independence with exercise equipment Exercise tolerated well No report of cardiac concerns or symptoms Strength training completed today  Goals Unmet:  Not Applicable  Comments: Pt able to follow exercise prescription today without complaint.  Will continue to monitor for progression. Check out 10:30.   Dr. Kate Sable is Medical Director for [facility] Cardiac and Pulmonary Rehab.

## 2018-01-01 ENCOUNTER — Encounter (HOSPITAL_COMMUNITY)
Admission: RE | Admit: 2018-01-01 | Discharge: 2018-01-01 | Disposition: A | Discharge status: Home / Self Care | Payer: Medicare Other | Source: Ambulatory Visit | Attending: Cardiology | Admitting: Cardiology

## 2018-01-01 DIAGNOSIS — Z955 Presence of coronary angioplasty implant and graft: Secondary | ICD-10-CM | POA: Diagnosis not present

## 2018-01-01 DIAGNOSIS — I213 ST elevation (STEMI) myocardial infarction of unspecified site: Secondary | ICD-10-CM

## 2018-01-01 NOTE — Progress Notes (Signed)
Daily Session Note  Patient Details  Name: MUADH CREASY MRN: [id number] Date of Birth: 01/13/1946 Referring Provider:     CARDIAC REHAB [place] from 11/29/2017 in [place]  Referring Provider  Branch      Encounter Date: 01/01/2018  Check In: Session Check In - 01/01/18 0930      Check-In   Supervising physician immediately available to respond to emergencies  See telemetry face sheet for immediately available MD    Location  [facility]    Staff Present  Russella Dar, MS, EP, [facility], Exercise Physiologist;Dylana Shaw Zachery Conch, Exercise Physiologist;Debra Wynetta Emery, RN, BSN    Medication changes reported      No    Fall or balance concerns reported     No    Warm-up and Cool-down  Performed as group-led instruction    Resistance Training Performed  Yes    VAD Patient?  No    PAD/SET Patient?  No      Pain Assessment   Currently in Pain?  No/denies    Pain Score  0-No pain    Multiple Pain Sites  No       Capillary Blood Glucose: No results found for this or any previous visit (from the past 24 hour(s)).    Social History   Tobacco Use  Smoking Status Former Smoker  . Types: Pipe  . Last attempt to quit: 06/07/2014  . Years since quitting: 3.5  Smokeless Tobacco Never Used    Goals Met:  Independence with exercise equipment Exercise tolerated well No report of cardiac concerns or symptoms Strength training completed today  Goals Unmet:  Not Applicable  Comments: Pt able to follow exercise prescription today without complaint.  Will continue to monitor for progression. Check out 10:30.   Dr. Kate Sable is Medical Director for [facility] Cardiac and Pulmonary Rehab.

## 2018-01-03 ENCOUNTER — Encounter (HOSPITAL_COMMUNITY)
Admission: RE | Admit: 2018-01-03 | Discharge: 2018-01-03 | Disposition: A | Discharge status: Home / Self Care | Payer: Medicare Other | Source: Ambulatory Visit | Attending: Cardiology | Admitting: Cardiology

## 2018-01-03 DIAGNOSIS — Z955 Presence of coronary angioplasty implant and graft: Secondary | ICD-10-CM | POA: Diagnosis not present

## 2018-01-03 DIAGNOSIS — I213 ST elevation (STEMI) myocardial infarction of unspecified site: Secondary | ICD-10-CM | POA: Diagnosis not present

## 2018-01-03 NOTE — Progress Notes (Signed)
Daily Session Note  Patient Details  Name: [name] [name] MRN: [id number] Date of Birth: 14-Jun-1945 Referring Provider:     CARDIAC REHAB [place] from 11/29/2017 in [place]  Referring Provider  Branch      Encounter Date: 01/03/2018  Check In: Session Check In - 01/03/18 0930      Check-In   Supervising physician immediately available to respond to emergencies  See telemetry face sheet for immediately available MD    Location  [facility]    Staff Present  Russella Dar, MS, EP, [facility], Exercise Physiologist;Densel Kronick Zachery Conch, Exercise Physiologist    Medication changes reported      No    Fall or balance concerns reported     No    Warm-up and Cool-down  Performed as group-led instruction    Resistance Training Performed  Yes    VAD Patient?  No    PAD/SET Patient?  No      Pain Assessment   Currently in Pain?  No/denies    Pain Score  0-No pain    Multiple Pain Sites  No       Capillary Blood Glucose: No results found for this or any previous visit (from the past 24 hour(s)).    Social History   Tobacco Use  Smoking Status Former Smoker  . Types: Pipe  . Last attempt to quit: 06/07/2014  . Years since quitting: 3.5  Smokeless Tobacco Never Used    Goals Met:  Independence with exercise equipment Exercise tolerated well No report of cardiac concerns or symptoms Strength training completed today  Goals Unmet:  Not Applicable  Comments: Pt able to follow exercise prescription today without complaint.  Will continue to monitor for progression. Check out 10:30.   Dr. Kate Sable is Medical Director for [facility] Cardiac and Pulmonary Rehab.

## 2018-01-05 ENCOUNTER — Encounter (HOSPITAL_COMMUNITY)
Admission: RE | Admit: 2018-01-05 | Discharge: 2018-01-05 | Disposition: A | Discharge status: Home / Self Care | Payer: Medicare Other | Source: Ambulatory Visit | Attending: Cardiology | Admitting: Cardiology

## 2018-01-05 DIAGNOSIS — I213 ST elevation (STEMI) myocardial infarction of unspecified site: Secondary | ICD-10-CM

## 2018-01-05 DIAGNOSIS — Z955 Presence of coronary angioplasty implant and graft: Secondary | ICD-10-CM

## 2018-01-05 NOTE — Progress Notes (Signed)
Daily Session Note  Patient Details  Name: [name] [name] MRN: [id number] Date of Birth: [birth year] Referring Provider:     CARDIAC REHAB PHASE II ORIENTATION from 11/29/2017 in [place]  Referring Provider  Branch      Encounter Date: 01/05/2018  Check In: Session Check In - 01/05/18 0930      Check-In   Supervising physician immediately available to respond to emergencies  See telemetry face sheet for immediately available MD    Location  [facility]    Staff Present  Russella Dar, MS, EP, [facility], Exercise Physiologist;Fred Hammes Zachery Conch, Exercise Physiologist    Medication changes reported      No    Fall or balance concerns reported     No    Warm-up and Cool-down  Performed as group-led instruction    Resistance Training Performed  Yes    VAD Patient?  No    PAD/SET Patient?  No      Pain Assessment   Currently in Pain?  No/denies    Pain Score  0-No pain    Multiple Pain Sites  No       Capillary Blood Glucose: No results found for this or any previous visit (from the past 24 hour(s)).    Social History   Tobacco Use  Smoking Status Former Smoker  . Types: Pipe  . Last attempt to quit: 06/07/2014  . Years since quitting: 3.5  Smokeless Tobacco Never Used    Goals Met:  Proper associated with RPD/PD & O2 Sat Independence with exercise equipment Exercise tolerated well No report of cardiac concerns or symptoms Strength training completed today  Goals Unmet:  Not Applicable  Comments: Pt able to follow exercise prescription today without complaint.  Will continue to monitor for progression. Check out 10:30.   Dr. Kate Sable is Medical Director for [facility] Cardiac and Pulmonary Rehab.

## 2018-01-08 ENCOUNTER — Encounter (HOSPITAL_COMMUNITY)
Admission: RE | Admit: 2018-01-08 | Discharge: 2018-01-08 | Disposition: A | Discharge status: Home / Self Care | Payer: Medicare Other | Source: Ambulatory Visit | Attending: Cardiology | Admitting: Cardiology

## 2018-01-08 DIAGNOSIS — Z955 Presence of coronary angioplasty implant and graft: Secondary | ICD-10-CM | POA: Diagnosis not present

## 2018-01-08 DIAGNOSIS — I213 ST elevation (STEMI) myocardial infarction of unspecified site: Secondary | ICD-10-CM

## 2018-01-08 NOTE — Progress Notes (Signed)
Daily Session Note  Patient Details  Name: DECODA VAN MRN: [id number] Date of Birth: 12-01-1945 Referring Provider:     CARDIAC REHAB PHASE II ORIENTATION from 11/29/2017 in [place]  Referring Provider  Branch      Encounter Date: 01/08/2018  Check In: Session Check In - 01/08/18 0930      Check-In   Supervising physician immediately available to respond to emergencies  See telemetry face sheet for immediately available MD    Location  [facility]    Staff Present  Russella Dar, MS, EP, [facility], Exercise Physiologist;Kelty Szafran Zachery Conch, Exercise Physiologist    Medication changes reported      No    Fall or balance concerns reported     No    Warm-up and Cool-down  Performed as group-led instruction    Resistance Training Performed  Yes    VAD Patient?  No    PAD/SET Patient?  No      Pain Assessment   Currently in Pain?  No/denies    Pain Score  0-No pain    Multiple Pain Sites  No       Capillary Blood Glucose: No results found for this or any previous visit (from the past 24 hour(s)).    Social History   Tobacco Use  Smoking Status Former Smoker  . Types: Pipe  . Last attempt to quit: 06/07/2014  . Years since quitting: 3.5  Smokeless Tobacco Never Used    Goals Met:  Proper associated with RPD/PD & O2 Sat Independence with exercise equipment Exercise tolerated well No report of cardiac concerns or symptoms Strength training completed today  Goals Unmet:  Not Applicable  Comments: Pt able to follow exercise prescription today without complaint.  Will continue to monitor for progression. Check out 10:30.   Dr. Kate Sable is Medical Director for [facility] Cardiac and Pulmonary Rehab.

## 2018-01-08 NOTE — Progress Notes (Signed)
Daily Session Note  Patient Details  Name: [name] [name] MRN: [id number] Date of Birth: 08-Apr-1945 Referring Provider:     CARDIAC REHAB PHASE II ORIENTATION from 11/29/2017 in [place]  Referring Provider  Branch      Encounter Date: 01/08/2018  Check In: Session Check In - 01/08/18 0930      Check-In   Supervising physician immediately available to respond to emergencies  See telemetry face sheet for immediately available MD    Location  [facility]    Staff Present  Russella Dar, MS, EP, [facility], Exercise Physiologist;Chau Savell Zachery Conch, Exercise Physiologist    Medication changes reported      No    Fall or balance concerns reported     No    Warm-up and Cool-down  Performed as group-led instruction    Resistance Training Performed  Yes    VAD Patient?  No    PAD/SET Patient?  No      Pain Assessment   Currently in Pain?  No/denies    Pain Score  0-No pain    Multiple Pain Sites  No       Capillary Blood Glucose: No results found for this or any previous visit (from the past 24 hour(s)).    Social History   Tobacco Use  Smoking Status Former Smoker  . Types: Pipe  . Last attempt to quit: 06/07/2014  . Years since quitting: 3.5  Smokeless Tobacco Never Used    Goals Met:  Proper associated with RPD/PD & O2 Sat Independence with exercise equipment Exercise tolerated well No report of cardiac concerns or symptoms Strength training completed today  Goals Unmet:  Not Applicable  Comments: Pt able to follow exercise prescription today without complaint.  Will continue to monitor for progression. Check out 10:30.   Dr. Kate Sable is Medical Director for [facility] Cardiac and Pulmonary Rehab.

## 2018-01-10 ENCOUNTER — Other Ambulatory Visit: Payer: Self-pay

## 2018-01-10 ENCOUNTER — Encounter (HOSPITAL_COMMUNITY)
Admission: RE | Admit: 2018-01-10 | Discharge: 2018-01-10 | Disposition: A | Discharge status: Home / Self Care | Payer: Medicare Other | Source: Ambulatory Visit | Attending: Cardiology | Admitting: Cardiology

## 2018-01-10 DIAGNOSIS — Z955 Presence of coronary angioplasty implant and graft: Secondary | ICD-10-CM | POA: Diagnosis not present

## 2018-01-10 DIAGNOSIS — I213 ST elevation (STEMI) myocardial infarction of unspecified site: Secondary | ICD-10-CM

## 2018-01-10 NOTE — Progress Notes (Signed)
Daily Session Note  Patient Details  Name: [name] [name] MRN: [id number] Date of Birth: 03/14/1946 Referring Provider:     CARDIAC REHAB PHASE II ORIENTATION from 11/29/2017 in [place] CARDIAC [facility]ILIT[facility]  Referring Provider  Branch      Encounter Date: 01/10/2018  Check In: Session Check In - 01/10/18 0930      Check-In   Supervising physician immediately available to respond to emergencies  See telemetry face sheet for immediately available MD    Location  [facility]    Staff Present  Diane Coad, MS, EP, [facility], Exercise Physiologist;Amanda Ballard, Exercise Physiologist    Medication changes reported      No    Fall or balance concerns reported     No    Warm-up and Cool-down  Performed as group-led instruction    Resistance Training Performed  Yes    VAD Patient?  No    PAD/SET Patient?  No      Pain Assessment   Currently in Pain?  No/denies    Pain Score  0-No pain    Multiple Pain Sites  No       Capillary Blood Glucose: No results found for this or any previous visit (from the past 24 hour(s)).    Social History   Tobacco Use  Smoking Status Former Smoker  . Types: Pipe  . Last attempt to quit: 06/07/2014  . Years since quitting: 3.5  Smokeless Tobacco Never Used    Goals Met:  Proper associated with RPD/PD & O2 Sat Independence with exercise equipment Exercise tolerated well No report of cardiac concerns or symptoms Strength training completed today  Goals Unmet:  Not Applicable  Comments: Pt able to follow exercise prescription today without complaint.  Will continue to monitor for progression. Check out 10:30.   Dr. Suresh Koneswaran is Medical Director for [place] Cardiac and Pulmonary Rehab. 

## 2018-01-10 NOTE — Patient Outreach (Signed)
[facility]) Care Management  01/10/2018  [name] [name] 1946-03-19 [id number]   Message received from [name] on 01/09/18. Member requested to reschedule appointment due to a family emergency. Will follow up on next week for assessment, reevaluation of needs, and case closure.   [place] [phone]  .

## 2018-01-12 ENCOUNTER — Encounter (HOSPITAL_COMMUNITY)
Admission: RE | Admit: 2018-01-12 | Discharge: 2018-01-12 | Disposition: A | Discharge status: Home / Self Care | Payer: Medicare Other | Source: Ambulatory Visit | Attending: Cardiology | Admitting: Cardiology

## 2018-01-12 DIAGNOSIS — Z955 Presence of coronary angioplasty implant and graft: Secondary | ICD-10-CM | POA: Diagnosis not present

## 2018-01-12 DIAGNOSIS — I213 ST elevation (STEMI) myocardial infarction of unspecified site: Secondary | ICD-10-CM | POA: Diagnosis not present

## 2018-01-12 NOTE — Progress Notes (Signed)
Daily Session Note  Patient Details  Name: TYJAY GALINDO MRN: [id number] Date of Birth: 1946/03/18 Referring Provider:     CARDIAC REHAB PHASE II ORIENTATION from 11/29/2017 in [place]  Referring Provider  Branch      Encounter Date: 01/12/2018  Check In: Session Check In - 01/12/18 0930      Check-In   Supervising physician immediately available to respond to emergencies  See telemetry face sheet for immediately available MD    Location  [facility]    Staff Present  Benay Pike, Exercise Physiologist;Debra Wynetta Emery, RN, BSN    Medication changes reported      No    Fall or balance concerns reported     No    Warm-up and Cool-down  Performed as group-led instruction    Resistance Training Performed  Yes    VAD Patient?  No    PAD/SET Patient?  No      Pain Assessment   Currently in Pain?  No/denies    Pain Score  0-No pain    Multiple Pain Sites  No       Capillary Blood Glucose: No results found for this or any previous visit (from the past 24 hour(s)).    Social History   Tobacco Use  Smoking Status Former Smoker  . Types: Pipe  . Last attempt to quit: 06/07/2014  . Years since quitting: 3.6  Smokeless Tobacco Never Used    Goals Met:  Proper associated with RPD/PD & O2 Sat Independence with exercise equipment Exercise tolerated well No report of cardiac concerns or symptoms Strength training completed today  Goals Unmet:  Not Applicable  Comments: Pt able to follow exercise prescription today without complaint.  Will continue to monitor for progression. Check out 10:30.   Dr. Kate Sable is Medical Director for [facility] Cardiac and Pulmonary Rehab.

## 2018-01-15 ENCOUNTER — Encounter (HOSPITAL_COMMUNITY)
Admission: RE | Admit: 2018-01-15 | Discharge: 2018-01-15 | Disposition: A | Discharge status: Home / Self Care | Payer: Medicare Other | Source: Ambulatory Visit | Attending: Cardiology | Admitting: Cardiology

## 2018-01-15 DIAGNOSIS — I213 ST elevation (STEMI) myocardial infarction of unspecified site: Secondary | ICD-10-CM

## 2018-01-15 DIAGNOSIS — Z955 Presence of coronary angioplasty implant and graft: Secondary | ICD-10-CM | POA: Diagnosis not present

## 2018-01-15 NOTE — Progress Notes (Signed)
Daily Session Note  Patient Details  Name: JOBIN MONTELONGO MRN: [id number] Date of Birth: 26-Sep-1945 Referring Provider:     CARDIAC REHAB PHASE II ORIENTATION from 11/29/2017 in [place]  Referring Provider  Branch      Encounter Date: 01/15/2018  Check In: Session Check In - 01/15/18 0930      Check-In   Supervising physician immediately available to respond to emergencies  See telemetry face sheet for immediately available MD    Location  [facility]    Staff Present  Russella Dar, MS, EP, [facility], Exercise Physiologist;Peggyann Zwiefelhofer Zachery Conch, Exercise Physiologist;Debra Wynetta Emery, RN, BSN    Medication changes reported      No    Fall or balance concerns reported     No    Warm-up and Cool-down  Performed as group-led instruction    Resistance Training Performed  Yes    VAD Patient?  No    PAD/SET Patient?  No      Pain Assessment   Currently in Pain?  No/denies    Pain Score  0-No pain    Multiple Pain Sites  No       Capillary Blood Glucose: No results found for this or any previous visit (from the past 24 hour(s)).    Social History   Tobacco Use  Smoking Status Former Smoker  . Types: Pipe  . Last attempt to quit: 06/07/2014  . Years since quitting: 3.6  Smokeless Tobacco Never Used    Goals Met:  Independence with exercise equipment Exercise tolerated well No report of cardiac concerns or symptoms Strength training completed today  Goals Unmet:  Not Applicable  Comments: Pt able to follow exercise prescription today without complaint.  Will continue to monitor for progression. Check out 10:30.   Dr. Kate Sable is Medical Director for [facility] Cardiac and Pulmonary Rehab.

## 2018-01-17 ENCOUNTER — Encounter (HOSPITAL_COMMUNITY)
Admission: RE | Admit: 2018-01-17 | Discharge: 2018-01-17 | Disposition: A | Discharge status: Home / Self Care | Payer: Medicare Other | Source: Ambulatory Visit | Attending: Cardiology | Admitting: Cardiology

## 2018-01-17 DIAGNOSIS — Z955 Presence of coronary angioplasty implant and graft: Secondary | ICD-10-CM

## 2018-01-17 DIAGNOSIS — I213 ST elevation (STEMI) myocardial infarction of unspecified site: Secondary | ICD-10-CM

## 2018-01-17 NOTE — Progress Notes (Signed)
Daily Session Note  Patient Details  Name: [name] [name] MRN: [id number] Date of Birth: May 04, 1945 Referring Provider:     CARDIAC REHAB PHASE II ORIENTATION from 11/29/2017 in [place]  Referring Provider  Branch      Encounter Date: 01/17/2018  Check In: Session Check In - 01/17/18 0930      Check-In   Supervising physician immediately available to respond to emergencies  See telemetry face sheet for immediately available MD    Location  [facility]    Staff Present  Benay Pike, Exercise Physiologist;Jabarri Stefanelli Wynetta Emery, RN, BSN    Medication changes reported      No    Fall or balance concerns reported     No    Warm-up and Cool-down  Performed as group-led instruction    Resistance Training Performed  Yes    VAD Patient?  No    PAD/SET Patient?  No      Pain Assessment   Currently in Pain?  No/denies    Pain Score  0-No pain    Multiple Pain Sites  No       Capillary Blood Glucose: No results found for this or any previous visit (from the past 24 hour(s)).    Social History   Tobacco Use  Smoking Status Former Smoker  . Types: Pipe  . Last attempt to quit: 06/07/2014  . Years since quitting: 3.6  Smokeless Tobacco Never Used    Goals Met:  Independence with exercise equipment Exercise tolerated well No report of cardiac concerns or symptoms Strength training completed today  Goals Unmet:  Not Applicable  Comments: Pt able to follow exercise prescription today without complaint.  Will continue to monitor for progression. Check out 1030.   Dr. Kate Sable is Medical Director for [facility] Cardiac and Pulmonary Rehab.

## 2018-01-19 ENCOUNTER — Other Ambulatory Visit: Payer: Self-pay

## 2018-01-19 ENCOUNTER — Encounter (HOSPITAL_COMMUNITY)
Admission: RE | Admit: 2018-01-19 | Discharge: 2018-01-19 | Disposition: A | Discharge status: Home / Self Care | Payer: Medicare Other | Source: Ambulatory Visit | Attending: Cardiology | Admitting: Cardiology

## 2018-01-19 DIAGNOSIS — I213 ST elevation (STEMI) myocardial infarction of unspecified site: Secondary | ICD-10-CM

## 2018-01-19 DIAGNOSIS — Z955 Presence of coronary angioplasty implant and graft: Secondary | ICD-10-CM | POA: Insufficient documentation

## 2018-01-19 NOTE — Patient Outreach (Signed)
[place] [facility]) Care Management   01/19/2018  BRAIN HONEYCUTT [birth year] [id number]  NAKOTA ELSEN is an 72 y.o. male  Subjective:  Home visit complete. Mr. Umland was alert, oriented and denied complaints of pain or discomfort.  Objective:   BP 106/68 (BP Location: Left Arm, Patient Position: Sitting, Cuff Size: Normal) Comment: 122/78 in right arm  Pulse 69   Resp 18   Ht 1.829 m (6')   Wt 187 lb (84.8 kg)   SpO2 95%   BMI 25.36 kg/m    Review of Systems  Constitutional: Negative.   HENT: Negative.        Hearing Aide.  Eyes: Negative.   Respiratory: Positive for cough.   Cardiovascular: Negative.   Gastrointestinal: Positive for constipation.       Reported episodes of constipation.  Genitourinary: Negative.   Musculoskeletal: Negative.   Skin:       Mild itching.     Physical Exam  Constitutional: He is oriented to person, place, and time. He appears well-developed.  Cardiovascular: Normal rate.  Respiratory: Effort normal and breath sounds normal.  GI: Soft. Bowel sounds are normal.  Neurological: He is alert and oriented to person, place, and time.  Skin: Skin is warm and dry.  Psychiatric: He has a normal mood and affect. His behavior is normal. Judgment and thought content normal.    Encounter Medications:   Outpatient Encounter Medications as of 01/19/2018  Medication Sig Note  . ALPRAZolam (XANAX) 0.25 MG tablet Take 0.25 mg by mouth at bedtime as needed for sleep.   Marland Kitchen apixaban (ELIQUIS) 5 MG TABS tablet Take 1 tablet (5 mg total) by mouth 2 (two) times daily.   . Calcium Citrate-Vitamin D (CALCIUM CITRATE + D3 PO) Take 1 tablet by mouth daily.   . carvedilol (COREG) 6.25 MG tablet Take 1 tablet (6.25 mg total) by mouth 2 (two) times daily with a meal.   . clopidogrel (PLAVIX) 75 MG tablet Take 1 tablet (75 mg total) by mouth daily.   . furosemide (LASIX) 40 MG tablet Take 40 mg by mouth daily as needed for fluid or edema. 12/15/2017:  Patient has not used in 52month.  .Javier DockerOil 350 MG CAPS Take 350 mg by mouth daily.   . Lactobacillus (ACIDOPHILUS PROBIOTIC) 10 MG TABS Take 10 mg by mouth daily.    .Marland Kitchenlosartan (COZAAR) 25 MG tablet Take 1 tablet (25 mg total) by mouth daily.   . Multiple Vitamins-Minerals (SENIOR VITES PO) Take 1 tablet by mouth daily.   . nitroGLYCERIN (NITROSTAT) 0.4 MG SL tablet Place 1 tablet (0.4 mg total) under the tongue every 5 (five) minutes as needed.   . NON FORMULARY Apply 1 application topically daily. Fluc2% Toe Nail Drops for Fungus   . rosuvastatin (CRESTOR) 40 MG tablet Take 1 tablet (40 mg total) by mouth daily.   . tamsulosin (FLOMAX) 0.4 MG CAPS capsule Take 0.4 mg by mouth daily.    .Marland Kitchentiotropium (SPIRIVA) 18 MCG inhalation capsule Place 1 capsule (18 mcg total) into inhaler and inhale daily.    No facility-administered encounter medications on file as of 01/19/2018.     Functional Status:   In your present state of health, do you have any difficulty performing the following activities: 11/02/2017 11/02/2017  Hearing? (No Data) Y  Comment Hearing Aid -  Vision? N -  Difficulty concentrating or making decisions? - -  Comment - -  Walking or climbing stairs? - -  Comment - -  Dressing or bathing? - -  Doing errands, shopping? - -  Some recent data might be hidden    Fall/Depression Screening:    Fall Risk  01/19/2018 11/29/2017 10/19/2017  Falls in the past year? 0 No No  Comment - - Emmi Telephone Survey: data to providers prior to load  Risk for fall due to : - (No Data) -  Risk for fall due to: Comment - No history of falls -   PHQ 2/9 Scores 01/19/2018 12/15/2017 11/29/2017 11/02/2017 10/19/2017 02/06/2015 01/08/2015  PHQ - 2 Score 0 0 1 0 1 0 0  PHQ- 9 Score - - 7 - - - -  Exception Documentation - - Medical reason - - - -    Assessment: Home visit complete.  Mr. Bekele reported feeling very well and noted that activity tolerance as significantly increased. No complaints of  pain, chest discomfort or shortness of breath. He participates in Cardiac rehab three times a week and remains compliant with medications, daily weights and nutrition recommendations.   Plan of care reviewed. No Emergency Department visits or hospitalizations since August 2019. Mr. Panther has met and maintained his goals and is agreeable to case closure. He reported no urgent medical needs but requested follow up regarding an emergency transportation appeal. He was agreeable to further outreach from a [place] Problem One     Most Recent Value  Care Plan Problem One  Risk for readmission related to Heart Failure  Role Documenting the Problem One  Care Management [place] for Problem One  Active  [facility] Long Term Goal   Over the next 90 days patient will not have a hospitalization related to Heart Failure.  [facility] Long Term Goal Start Date  10/19/17  [facility] CM Short Term Goal #1   Over the next 30 days patient will complete all follow up appointments as scheduled.  [facility] CM Short Term Goal #1 Start Date  10/19/17  [facility] CM Short Term Goal #2   Over the next 30 days patient will weigh himself and record daily readings.  [facility] CM Short Term Goal #2 Start Date  10/19/17       PLAN -Will place referral for outreach from [place] follow up and complete case closure on next week.   [place] [phone]

## 2018-01-19 NOTE — Progress Notes (Signed)
Daily Session Note  Patient Details  Name: [name] [name] MRN: [id number] Date of Birth: May 02, 1945 Referring Provider:     CARDIAC REHAB PHASE II ORIENTATION from 11/29/2017 in [place]  Referring Provider  Branch      Encounter Date: 01/19/2018  Check In: Session Check In - 01/19/18 1014      Check-In   Supervising physician immediately available to respond to emergencies  See telemetry face sheet for immediately available MD    Location  [facility]    Staff Present  Russella Dar, MS, EP, [facility], Exercise Physiologist;Debra Wynetta Emery, RN, BSN    Medication changes reported      No    Fall or balance concerns reported     No    Tobacco Cessation  No Change    Warm-up and Cool-down  Performed as group-led instruction    Resistance Training Performed  Yes    VAD Patient?  No    PAD/SET Patient?  No      Pain Assessment   Currently in Pain?  No/denies    Pain Score  0-No pain    Multiple Pain Sites  No       Capillary Blood Glucose: No results found for this or any previous visit (from the past 24 hour(s)).    Social History   Tobacco Use  Smoking Status Former Smoker  . Types: Pipe  . Last attempt to quit: 06/07/2014  . Years since quitting: 3.6  Smokeless Tobacco Never Used    Goals Met:  Independence with exercise equipment Exercise tolerated well Personal goals reviewed No report of cardiac concerns or symptoms Strength training completed today  Goals Unmet:  Not Applicable  Comments: Check out: 1030   Dr. Kate Sable is Medical Director for [facility] and Pulmonary Rehab.

## 2018-01-22 ENCOUNTER — Encounter (HOSPITAL_COMMUNITY)
Admission: RE | Admit: 2018-01-22 | Discharge: 2018-01-22 | Disposition: A | Discharge status: Home / Self Care | Payer: Medicare Other | Source: Ambulatory Visit | Attending: Cardiology | Admitting: Cardiology

## 2018-01-22 DIAGNOSIS — I213 ST elevation (STEMI) myocardial infarction of unspecified site: Secondary | ICD-10-CM

## 2018-01-22 DIAGNOSIS — Z955 Presence of coronary angioplasty implant and graft: Secondary | ICD-10-CM

## 2018-01-22 NOTE — Progress Notes (Signed)
Daily Session Note  Patient Details  Name: [name] [name] MRN: [id number] Date of Birth: [birth year] Referring Provider:     CARDIAC REHAB PHASE II ORIENTATION from 11/29/2017 in [place]  Referring Provider  Branch      Encounter Date: 01/22/2018  Check In: Session Check In - 01/22/18 0930      Check-In   Supervising physician immediately available to respond to emergencies  See telemetry face sheet for immediately available MD    Location  [facility]    Staff Present  Benay Pike, Exercise Physiologist;Debra Wynetta Emery, RN, BSN    Medication changes reported      No    Fall or balance concerns reported     No    Tobacco Cessation  No Change    Warm-up and Cool-down  Performed as group-led instruction    Resistance Training Performed  Yes    VAD Patient?  No    PAD/SET Patient?  No      Pain Assessment   Currently in Pain?  No/denies    Pain Score  0-No pain    Multiple Pain Sites  No       Capillary Blood Glucose: No results found for this or any previous visit (from the past 24 hour(s)).    Social History   Tobacco Use  Smoking Status Former Smoker  . Types: Pipe  . Last attempt to quit: 06/07/2014  . Years since quitting: 3.6  Smokeless Tobacco Never Used    Goals Met:  Independence with exercise equipment Exercise tolerated well No report of cardiac concerns or symptoms Strength training completed today  Goals Unmet:  Not Applicable  Comments: Pt able to follow exercise prescription today without complaint.  Will continue to monitor for progression. Check out 10:30.   Dr. Kate Sable is Medical Director for [facility] Cardiac and Pulmonary Rehab.

## 2018-01-22 NOTE — Progress Notes (Signed)
Cardiac Individual Treatment Plan  Patient Details  Name: [name] [name] MRN: [id number] Date of Birth: December 25, 1945 Referring Provider:     CARDIAC REHAB PHASE II ORIENTATION from 11/29/2017 in [place]  Referring Provider  Branch      Initial Encounter Date:    CARDIAC REHAB PHASE II ORIENTATION from 11/29/2017 in [place]  Date  11/29/17      Visit Diagnosis: ST elevation myocardial infarction (STEMI), unspecified artery [facility])  Status post coronary artery stent placement  Patient's Home Medications on Admission:  Current Outpatient Medications:  .  ALPRAZolam (XANAX) 0.25 MG tablet, Take 0.25 mg by mouth at bedtime as needed for sleep., Disp: , Rfl: 5 .  apixaban (ELIQUIS) 5 MG TABS tablet, Take 1 tablet (5 mg total) by mouth 2 (two) times daily., Disp: 180 tablet, Rfl: 3 .  Calcium Citrate-Vitamin D (CALCIUM CITRATE + D3 PO), Take 1 tablet by mouth daily., Disp: , Rfl:  .  carvedilol (COREG) 6.25 MG tablet, Take 1 tablet (6.25 mg total) by mouth 2 (two) times daily with a meal., Disp: 180 tablet, Rfl: 3 .  clopidogrel (PLAVIX) 75 MG tablet, Take 1 tablet (75 mg total) by mouth daily., Disp: 90 tablet, Rfl: 2 .  furosemide (LASIX) 40 MG tablet, Take 40 mg by mouth daily as needed for fluid or edema., Disp: , Rfl:  .  Krill Oil 350 MG CAPS, Take 350 mg by mouth daily., Disp: , Rfl:  .  Lactobacillus (ACIDOPHILUS PROBIOTIC) 10 MG TABS, Take 10 mg by mouth daily. , Disp: , Rfl:  .  losartan (COZAAR) 25 MG tablet, Take 1 tablet (25 mg total) by mouth daily., Disp: 90 tablet, Rfl: 3 .  Multiple Vitamins-Minerals (SENIOR VITES PO), Take 1 tablet by mouth daily., Disp: , Rfl:  .  nitroGLYCERIN (NITROSTAT) 0.4 MG SL tablet, Place 1 tablet (0.4 mg total) under the tongue every 5 (five) minutes as needed., Disp: 25 tablet, Rfl: 2 .  NON FORMULARY, Apply 1 application topically daily. Fluc2% Toe Nail Drops for Fungus, Disp: , Rfl:  .   rosuvastatin (CRESTOR) 40 MG tablet, Take 1 tablet (40 mg total) by mouth daily., Disp: 90 tablet, Rfl: 1 .  tamsulosin (FLOMAX) 0.4 MG CAPS capsule, Take 0.4 mg by mouth daily. , Disp: , Rfl:  .  tiotropium (SPIRIVA) 18 MCG inhalation capsule, Place 1 capsule (18 mcg total) into inhaler and inhale daily., Disp: 30 capsule, Rfl: 1  Past Medical History: Past Medical History:  Diagnosis Date  . Acute systolic heart failure ([place])   . COPD (chronic obstructive pulmonary disease) ([place])   . Hyperlipidemia   . Hypertension   . Low serum testosterone level   . PAF (paroxysmal atrial fibrillation) ([place])   . Pneumonia 05/2014  . Shortness of breath dyspnea   . STEMI (ST elevation myocardial infarction) ([place])    09/27/17 PCI/DES x1 mLAD, PTCA of the diag branch. EF 30% Lifevest at discharge.   . Urinary frequency     Tobacco Use: Social History   Tobacco Use  Smoking Status Former Smoker  . Types: Pipe  . Last attempt to quit: 06/07/2014  . Years since quitting: 3.6  Smokeless Tobacco Never Used    Labs: Recent Review Flowsheet Data    Labs for ITP Cardiac and Pulmonary Rehab Latest Ref Rng & Units 06/26/2014 06/28/2014 09/27/2017   PHART 7.350 - 7.450 7.491(H) 7.450 -   PCO2ART 35.0 - 45.0 mmHg 29.5(L) 34.2(L) -  HCO3 20.0 - 24.0 mEq/L 22.0 23.4 -   TCO2 22 - 32 mmol/L 22.8 24.5 22   ACIDBASEDEF 0.0 - 2.0 mmol/L 0.7 0.1 -   O2SAT % 91.5 95.6 -      Capillary Blood Glucose: Lab Results  Component Value Date   GLUCAP 114 (H) 06/07/2014     Exercise Target Goals: Exercise Program Goal: Individual exercise prescription set using results from initial 6 min walk test and THRR while considering  patient's activity barriers and safety.   Exercise Prescription Goal: Starting with aerobic activity 30 plus minutes a day, 3 days per week for initial exercise prescription. Provide home exercise prescription and guidelines that participant acknowledges understanding prior to  discharge.  Activity Barriers & Risk Stratification: Activity Barriers & Cardiac Risk Stratification - 11/29/17 1344      Activity Barriers & Cardiac Risk Stratification   Activity Barriers  Back Problems;Shortness of Breath   spinal fusion; COPD    Cardiac Risk Stratification  High       6 Minute Walk: 6 Minute Walk    Row Name 11/29/17 1343         6 Minute Walk   Phase  Initial     Distance  1700 feet     Walk Time  6 minutes     # of Rest Breaks  0     MPH  3.22     METS  3.47     RPE  9     Perceived Dyspnea   11     VO2 Peak  12.1     Symptoms  No     Resting HR  58 bpm     Resting BP  110/52     Resting Oxygen Saturation   97 %     Exercise Oxygen Saturation  during 6 min walk  94 %     Max Ex. HR  84 bpm     Max Ex. BP  126/60     2 Minute Post BP  110/58        Oxygen Initial Assessment:   Oxygen Re-Evaluation:   Oxygen Discharge (Final Oxygen Re-Evaluation):   Initial Exercise Prescription: Initial Exercise Prescription - 11/29/17 1300      Date of Initial Exercise RX and Referring Provider   Date  11/29/17    Referring Provider  Branch      Treadmill   MPH  2.5    Grade  0    Minutes  17    METs  2.91      Recumbant Elliptical   Level  1    RPM  49    Watts  55    Minutes  17    METs  4      Prescription Details   Frequency (times per week)  3    Duration  Progress to 30 minutes of continuous aerobic without signs/symptoms of physical distress      Intensity   THRR 40-80% of Max Heartrate  [phone]    Ratings of Perceived Exertion  11-13    Perceived Dyspnea  0-4      Progression   Progression  Continue to progress workloads to maintain intensity without signs/symptoms of physical distress.      Resistance Training   Training Prescription  Yes    Weight  1    Reps  10-15       Perform Capillary Blood Glucose checks as needed.  Exercise Prescription Changes:  Exercise Prescription Changes    Row Name 12/18/17 1400  01/09/18 0700           Response to Exercise   Blood Pressure (Admit)  98/62  104/58      Blood Pressure (Exercise)  90/60  110/52      Blood Pressure (Exit)  106/50  98/60      Heart Rate (Admit)  55 bpm  54 bpm      Heart Rate (Exercise)  96 bpm  111 bpm      Heart Rate (Exit)  61 bpm  62 bpm      Rating of Perceived Exertion (Exercise)  12  12      Comments  -  increased overall MET level       Duration  Progress to 30 minutes of  aerobic without signs/symptoms of physical distress  Progress to 30 minutes of  aerobic without signs/symptoms of physical distress      Intensity  THRR unchanged  THRR unchanged        Progression   Progression  Continue to progress workloads to maintain intensity without signs/symptoms of physical distress.  Continue to progress workloads to maintain intensity without signs/symptoms of physical distress.      Average METs  4.2  4.65        Resistance Training   Training Prescription  Yes  Yes      Weight  2  4      Reps  10-15  10-15      Time  -  5 Minutes        Treadmill   MPH  3  3.2      Grade  1  1      Minutes  17  17      METs  3.81  3.79        Recumbant Elliptical   Level  2  3      RPM  62  67      Watts  86  101      Minutes  22  22      METs  4.6  5.5        Home Exercise Plan   Plans to continue exercise at  [employer] (comment) [employer] - cardio and [employer] (comment)      Frequency  Add 2 additional days to program exercise sessions.  Add 2 additional days to program exercise sessions.      Initial Home Exercises Provided  11/29/17  11/29/17         Exercise Comments:  Exercise Comments    Row Name 12/04/17 0757 12/25/17 1425 01/19/18 1501       Exercise Comments  Patient has just started. He is doing well so far. Will continue to monitor progress and advance accordingly.   Patient has worked hard in the program and tolerated all increases with ease. He is eager to progress. We will  continue to monitor and progress as we see fit.   He has worked hard to increase his overall MET level. Patient is up to 3.3 MPH and 1% grade on the treadmill and does it with ease. Will continue to monitor and progress his workloads as he improves strength.         Exercise Goals and Review:  Exercise Goals    Row Name 11/29/17 1436             Exercise Goals  Increase Physical Activity  Yes       Intervention  Develop an individualized exercise prescription for aerobic and resistive training based on initial evaluation findings, risk stratification, comorbidities and participant's personal goals.       Increase Strength and Stamina  Yes       Intervention  Provide advice, education, support and counseling about physical activity/exercise needs.;Develop an individualized exercise prescription for aerobic and resistive training based on initial evaluation findings, risk stratification, comorbidities and participant's personal goals.       Expected Outcomes  Short Term: Increase workloads from initial exercise prescription for resistance, speed, and METs.;Short Term: Perform resistance training exercises routinely during rehab and add in resistance training at home;Long Term: Improve cardiorespiratory fitness, muscular endurance and strength as measured by increased METs and functional capacity (6MWT)       Able to understand and use rate of perceived exertion (RPE) scale  Yes       Intervention  Provide education and explanation on how to use RPE scale       Expected Outcomes  Short Term: Able to use RPE daily in rehab to express subjective intensity level       Knowledge and understanding of Target Heart Rate Range (THRR)  Yes       Intervention  Provide education and explanation of THRR including how the numbers were predicted and where they are located for reference       Expected Outcomes  Short Term: Able to state/look up THRR;Long Term: Able to use THRR to govern intensity when  exercising independently       Able to check pulse independently  Yes       Intervention  Provide education and demonstration on how to check pulse in carotid and radial arteries.       Expected Outcomes  Short Term: Able to explain why pulse checking is important during independent exercise;Long Term: Able to check pulse independently and accurately       Understanding of Exercise Prescription  Yes       Intervention  Provide education, explanation, and written materials on patient's individual exercise prescription       Expected Outcomes  Short Term: Able to explain program exercise prescription;Long Term: Able to explain home exercise prescription to exercise independently          Exercise Goals Re-Evaluation : Exercise Goals Re-Evaluation    Row Name 12/04/17 0753 12/25/17 1423 01/19/18 1459         Exercise Goal Re-Evaluation   Exercise Goals Review  Increase Physical Activity;Able to understand and use rate of perceived exertion (RPE) scale;Knowledge and understanding of Target Heart Rate Range (THRR);Able to check pulse independently;Understanding of Exercise Prescription  Increase Physical Activity;Able to understand and use rate of perceived exertion (RPE) scale;Knowledge and understanding of Target Heart Rate Range (THRR);Able to check pulse independently;Understanding of Exercise Prescription  Increase Physical Activity;Able to understand and use rate of perceived exertion (RPE) scale;Knowledge and understanding of Target Heart Rate Range (THRR);Able to check pulse independently;Understanding of Exercise Prescription     Comments  Patient has just started program. He has been to 2 visits. Patient is doing well in classes so far and we will continue to monitor for progress. We will progress his his equipment and weights every 2 weeks.   Paitent works hard in rehab everyday. He has had 12 sessoins so far. He is determined to get better and back to the same energy level he had before  his  STEMI. He has increased his overall MET level and weights.   Patient continues to work hard to push himself daily. He is supplementing his exercise here by going to the [facility] 2 days a week as well. He continues to progress with each session and has tolerated all workload increases with ease.       Expected Outcomes  Get back to road trips and boogie boarding.   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   Increase energy and overall activity levels.          Discharge Exercise Prescription (Final Exercise Prescription Changes): Exercise Prescription Changes - 01/09/18 0700      Response to Exercise   Blood Pressure (Admit)  104/58    Blood Pressure (Exercise)  110/52    Blood Pressure (Exit)  98/60    Heart Rate (Admit)  54 bpm    Heart Rate (Exercise)  111 bpm    Heart Rate (Exit)  62 bpm    Rating of Perceived Exertion (Exercise)  12    Comments  increased overall MET level     Duration  Progress to 30 minutes of  aerobic without signs/symptoms of physical distress    Intensity  THRR unchanged      Progression   Progression  Continue to progress workloads to maintain intensity without signs/symptoms of physical distress.    Average METs  4.65      Resistance Training   Training Prescription  Yes    Weight  4    Reps  10-15    Time  5 Minutes      Treadmill   MPH  3.2    Grade  1    Minutes  17    METs  3.79      Recumbant Elliptical   Level  3    RPM  67    Watts  101    Minutes  22    METs  5.5      Home Exercise Plan   Plans to continue exercise at  [employer] (comment)    Frequency  Add 2 additional days to program exercise sessions.    Initial Home Exercises Provided  11/29/17       Nutrition:  Target Goals: Understanding of nutrition guidelines, daily intake of sodium '1500mg'$ , cholesterol '200mg'$ , calories 30% from fat and 7% or less from saturated fats, daily to have 5 or more servings of fruits and  vegetables.  Biometrics: Pre Biometrics - 11/29/17 1346      Pre Biometrics   Height  6' (1.829 m)    Waist Circumference  36 inches    Hip Circumference  40 inches    Waist to Hip Ratio  0.9 %    BMI (Calculated)  26.37    Triceps Skinfold  5 mm    % Body Fat  20.6 %    Grip Strength  28.6 kg    Flexibility  0 in    Single Leg Stand  5.86 seconds        Nutrition Therapy Plan and Nutrition Goals: Nutrition Therapy & Goals - 12/21/17 1552      Personal Nutrition Goals   Nutrition Goal  For heart healthy choices add >50% of whole grains, make half their plate fruits and vegetables. Discuss the difference between starchy vegetables and leafy greens, and how leafy vegetables provide fiber, helps maintain healthy weight, helps control blood glucose, and lowers cholesterol.  Discuss  purchasing fresh or frozen vegetable to reduce sodium and not to add grease, fat or sugar. Consume <18oz of red meat per week. Consume lean cuts of meats and very little of meats high in sodium and nitrates such as pork and lunch meats. Discussed portion control for all food groups.      Comments  Patient met with RD today 12/21/17.      Intervention Plan   Intervention  Nutrition handout(s) given to patient.    Expected Outcomes  Short Term Goal: Understand basic principles of dietary content, such as calories, fat, sodium, cholesterol and nutrients.       Nutrition Assessments: Nutrition Assessments - 11/29/17 1523      MEDFICTS Scores   Pre Score  27       Nutrition Goals Re-Evaluation: Nutrition Goals Re-Evaluation    [place] Name 12/25/17 1455 01/22/18 1450           Goals   Current Weight  192 lb (87.1 kg)  191 lb 1.6 oz (86.7 kg)      Nutrition Goal  For heart healthy choices add >50% of whole grains, make half their plate fruits and vegetables. Discuss the difference between starchy vegetables and leafy greens, and how leafy vegetables provide fiber, helps maintain healthy weight, helps  control blood glucose, and lowers cholesterol.  Discuss purchasing fresh or frozen vegetable to reduce sodium and not to add grease, fat or sugar. Consume <18oz of red meat per week. Consume lean cuts of meats and very little of meats high in sodium and nitrates such as pork and lunch meats. Discussed portion control for all food groups.    For heart healthy choices add >50% of whole grains, make half their plate fruits and vegetables. Discuss the difference between starchy vegetables and leafy greens, and how leafy vegetables provide fiber, helps maintain healthy weight, helps control blood glucose, and lowers cholesterol.  Discuss purchasing fresh or frozen vegetable to reduce sodium and not to add grease, fat or sugar. Consume <18oz of red meat per week. Consume lean cuts of meats and very little of meats high in sodium and nitrates such as pork and lunch meats. Discussed portion control for all food groups.        Comment  Patient has lost 2 lbs since his orientation visit. He has met with RD and says he is trying to follow a low sodium, low fat, heart healthy diet. Will continue to monitor for progress.   Patient has lost 1 lb since his orientation visit. He has met with RD and says he is trying to follow a low sodium, low fat, heart healthy diet. Will continue to monitor for progress.         Personal Goal #2 Re-Evaluation   Personal Goal #2  -  Patient eating low sodium, low fat and heart healthy         Nutrition Goals Discharge (Final Nutrition Goals Re-Evaluation): Nutrition Goals Re-Evaluation - 01/22/18 1450      Goals   Current Weight  191 lb 1.6 oz (86.7 kg)    Nutrition Goal  For heart healthy choices add >50% of whole grains, make half their plate fruits and vegetables. Discuss the difference between starchy vegetables and leafy greens, and how leafy vegetables provide fiber, helps maintain healthy weight, helps control blood glucose, and lowers cholesterol.  Discuss purchasing fresh or  frozen vegetable to reduce sodium and not to add grease, fat or sugar. Consume <18oz of red meat per  week. Consume lean cuts of meats and very little of meats high in sodium and nitrates such as pork and lunch meats. Discussed portion control for all food groups.      Comment  Patient has lost 1 lb since his orientation visit. He has met with RD and says he is trying to follow a low sodium, low fat, heart healthy diet. Will continue to monitor for progress.       Personal Goal #2 Re-Evaluation   Personal Goal #2  Patient eating low sodium, low fat and heart healthy       Psychosocial: Target Goals: Acknowledge presence or absence of significant depression and/or stress, maximize coping skills, provide positive support system. Participant is able to verbalize types and ability to use techniques and skills needed for reducing stress and depression.  Initial Review & Psychosocial Screening: Initial Psych Review & Screening - 11/29/17 1518      Initial Review   Current issues with  Current Depression;Current Stress Concerns    Source of Stress Concerns  Family   His recent health issues     Family Dynamics   Good Support System?  Yes      Barriers   Psychosocial barriers to participate in program  The patient should benefit from training in stress management and relaxation.;Psychosocial barriers identified (see note)      Screening Interventions   Interventions  Encouraged to exercise    Expected Outcomes  Short Term goal: Identification and review with participant of any Quality of Life or Depression concerns found by scoring the questionnaire.;Long Term goal: The participant improves quality of Life and PHQ9 Scores as seen by post scores and/or verbalization of changes       Quality of Life Scores: Quality of Life - 11/29/17 1408      Quality of Life   Select  Quality of Life      Quality of Life Scores   Health/Function Pre  19.73 %    Socioeconomic Pre  26.57 %     Psych/Spiritual Pre  15.93 %    Family Pre  24.2 %    GLOBAL Pre  19.73 %      Scores of 19 and below usually indicate a poorer quality of life in these areas.  A difference of  2-3 points is a clinically meaningful difference.  A difference of 2-3 points in the total score of the Quality of Life Index has been associated with significant improvement in overall quality of life, self-image, physical symptoms, and general health in studies assessing change in quality of life.  PHQ-9: Recent Review Flowsheet Data    Depression screen [facility] 2/9 01/19/2018 12/15/2017 11/29/2017 11/02/2017 10/19/2017   Decreased Interest 0 0 0 0 0   Down, Depressed, Hopeless 0 0 1 0 1   PHQ - 2 Score 0 0 1 0 1   Altered sleeping - - 1 - -   Tired, decreased energy - - 1 - -   Change in appetite - - 0 - -   Feeling bad or failure about yourself  - - 1 - -   Trouble concentrating - - 1 - -   Moving slowly or fidgety/restless - - 1 - -   Suicidal thoughts - - 1 - -   PHQ-9 Score - - 7 - -   Difficult doing work/chores - - Somewhat difficult - -     Interpretation of Total Score  Total Score Depression Severity:  1-4 = Minimal  depression, 5-9 = Mild depression, 10-14 = Moderate depression, 15-19 = Moderately severe depression, 20-27 = Severe depression   Psychosocial Evaluation and Intervention: Psychosocial Evaluation - 11/29/17 1522      Psychosocial Evaluation & Interventions   Interventions  Encouraged to exercise with the program and follow exercise prescription    Continue Psychosocial Services   Follow up required by staff       Psychosocial Re-Evaluation: Psychosocial Re-Evaluation    [place] Name 12/25/17 1500 01/22/18 1453           Psychosocial Re-Evaluation   Current issues with  None Identified  None Identified      Comments  Patient initial QOL score was 19.73 and his PHQ-9 score was 7 with no issues identified.   Patient initial QOL score was 19.73 and his PHQ-9 score was 7 with no issues  identified.       Expected Outcomes  Patient will have no psychosocial issues identified at discharge.   Patient will have no psychosocial issues identified at discharge.       Interventions  Stress management education;Encouraged to attend Cardiac Rehabilitation for the exercise;Relaxation education  Stress management education;Encouraged to attend Cardiac Rehabilitation for the exercise;Relaxation education      Continue Psychosocial Services   No Follow up required  No Follow up required         Psychosocial Discharge (Final Psychosocial Re-Evaluation): Psychosocial Re-Evaluation - 01/22/18 1453      Psychosocial Re-Evaluation   Current issues with  None Identified    Comments  Patient initial QOL score was 19.73 and his PHQ-9 score was 7 with no issues identified.     Expected Outcomes  Patient will have no psychosocial issues identified at discharge.     Interventions  Stress management education;Encouraged to attend Cardiac Rehabilitation for the exercise;Relaxation education    Continue Psychosocial Services   No Follow up required       Vocational Rehabilitation: Provide vocational rehab assistance to qualifying candidates.   Vocational Rehab Evaluation & Intervention: Vocational Rehab - 11/29/17 1524      Initial Vocational Rehab Evaluation & Intervention   Assessment shows need for Vocational Rehabilitation  No       Education: Education Goals: Education classes will be provided on a weekly basis, covering required topics. Participant will state understanding/return demonstration of topics presented.  Learning Barriers/Preferences: Learning Barriers/Preferences - 11/29/17 1523      Learning Barriers/Preferences   Learning Barriers  None    Learning Preferences  Verbal Instruction;Individual Instruction;Skilled Demonstration;Group Instruction       Education Topics: Hypertension, Hypertension Reduction -Define heart disease and high blood pressure. Discus how high  blood pressure affects the body and ways to reduce high blood pressure.   Exercise and Your Heart -Discuss why it is important to exercise, the FITT principles of exercise, normal and abnormal responses to exercise, and how to exercise safely.   Angina -Discuss definition of angina, causes of angina, treatment of angina, and how to decrease risk of having angina.   Cardiac Medications -Review what the following cardiac medications are used for, how they affect the body, and side effects that may occur when taking the medications.  Medications include Aspirin, Beta blockers, calcium channel blockers, ACE Inhibitors, angiotensin receptor blockers, diuretics, digoxin, and antihyperlipidemics.   Congestive Heart Failure -Discuss the definition of CHF, how to live with CHF, the signs and symptoms of CHF, and how keep track of weight and sodium intake.   Heart Disease  and Intimacy -Discus the effect sexual activity has on the heart, how changes occur during intimacy as we age, and safety during sexual activity.   Smoking Cessation / COPD -Discuss different methods to quit smoking, the health benefits of quitting smoking, and the definition of COPD.   CARDIAC REHAB PHASE II EXERCISE from 01/17/2018 in [place]  Date  12/06/17  Educator  Etheleen Mayhew  Instruction Review Code  2- Demonstrated Understanding      Nutrition I: Fats -Discuss the types of cholesterol, what cholesterol does to the heart, and how cholesterol levels can be controlled.   CARDIAC REHAB PHASE II EXERCISE from 01/17/2018 in [place]  Date  12/13/17  Educator  D. Coad  Instruction Review Code  2- Demonstrated Understanding      Nutrition II: Labels -Discuss the different components of food labels and how to read food label   [place] from 01/17/2018 in [place]  Date  12/20/17  Educator  Etheleen Mayhew  Instruction  Review Code  2- Demonstrated Understanding      Heart Parts/Heart Disease and PAD -Discuss the anatomy of the heart, the pathway of blood circulation through the heart, and these are affected by heart disease.   CARDIAC REHAB PHASE II EXERCISE from 01/17/2018 in [place]  Date  12/27/17  Educator  Etheleen Mayhew   Instruction Review Code  2- Demonstrated Understanding      Stress I: Signs and Symptoms -Discuss the causes of stress, how stress may lead to anxiety and depression, and ways to limit stress.   CARDIAC REHAB PHASE II EXERCISE from 01/17/2018 in [place]  Date  01/03/18  Educator  D. Coad  Instruction Review Code  2- Demonstrated Understanding      Stress II: Relaxation -Discuss different types of relaxation techniques to limit stress.   CARDIAC REHAB PHASE II EXERCISE from 01/17/2018 in [place]  Date  01/10/18  Educator  D. Coad  Instruction Review Code  2- Demonstrated Understanding      Warning Signs of Stroke / TIA -Discuss definition of a stroke, what the signs and symptoms are of a stroke, and how to identify when someone is having stroke.   CARDIAC REHAB PHASE II EXERCISE from 01/17/2018 in [place]  Date  01/17/18  Educator  Etheleen Mayhew  Instruction Review Code  2- Demonstrated Understanding      Knowledge Questionnaire Score: Knowledge Questionnaire Score - 11/29/17 1523      Knowledge Questionnaire Score   Pre Score  23/24       Core Components/Risk Factors/Patient Goals at Admission: Personal Goals and Risk Factors at Admission - 11/29/17 1524      Core Components/Risk Factors/Patient Goals on Admission    Weight Management  Weight Maintenance    Personal Goal Other  Yes    Personal Goal  Increase physical and menatal energy to before having MI    Intervention  Exercise 3xweek in CR and supplement with home exercise 2 x week.     Expected  Outcomes  Reach personal goals.       Core Components/Risk Factors/Patient Goals Review:  Goals and Risk Factor Review    Row Name 12/25/17 1456 01/22/18 1451           Core Components/Risk Factors/Patient Goals Review   Personal Goals Review  Weight Management/Obesity Increase physical and mental energy to before MI;  get back to road trips/boogie boarding. Get back to work.   Weight Management/Obesity Increase physical and mental energy to before MI; get back to road trips/boogie boarding.       Review  Patient has completed 12 sessions losing 2 lbs since his initial visit. He is doing well in the program with progression. He is exercising outside of CR and wants to progress more. He says he is doing more on his own than what he does in rehab.  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 He does feel like he is making progress. Will continue to monitor for progress.   Patient has completed 24 sessions losing 1 lb since last 30 day review. He continues to do well in the program with progression and continue to exercise at the [facility] in addition to CR. 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 He says he has more energy and feels like doing more. Will continue to monitor for progress.       Expected Outcomes  Patient will continue to attend sessions and complete the program meeting his personal goals.   Patient will continue to attend sessions and complete the program meeting his personal goals.          Core Components/Risk Factors/Patient Goals at Discharge (Final Review):  Goals and Risk Factor Review - 01/22/18 1451      Core Components/Risk Factors/Patient Goals Review   Personal Goals Review  Weight Management/Obesity   Increase physical and mental energy to before MI; get back to road trips/boogie boarding.    Review  Patient has completed  24 sessions losing 1 lb since last 30 day review. He continues to do well in the program with progression and continue to exercise at the [facility] in addition to CR. 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 He says he has more energy and feels like doing more. Will continue to monitor for progress.     Expected Outcomes  Patient will continue to attend sessions and complete the program meeting his personal goals.        ITP Comments: ITP Comments    Row Name 11/29/17 1514 12/04/17 1316         ITP Comments  Patient had back surgery May 2019. He is eager to get started and so that he can increase his physical and mental energy to before MI  Patient is new to program completing 3 sessions. Will continue to monitor for progress.          Comments: ITP REVIEW Patient continue to do well in the program. Will continue to monitor for progress.

## 2018-01-24 ENCOUNTER — Ambulatory Visit: Payer: Self-pay

## 2018-01-24 ENCOUNTER — Encounter (HOSPITAL_COMMUNITY)
Admission: RE | Admit: 2018-01-24 | Discharge: 2018-01-24 | Disposition: A | Discharge status: Home / Self Care | Payer: Medicare Other | Source: Ambulatory Visit | Attending: Cardiology | Admitting: Cardiology

## 2018-01-24 DIAGNOSIS — Z955 Presence of coronary angioplasty implant and graft: Secondary | ICD-10-CM

## 2018-01-24 DIAGNOSIS — I213 ST elevation (STEMI) myocardial infarction of unspecified site: Secondary | ICD-10-CM

## 2018-01-24 NOTE — Progress Notes (Signed)
Daily Session Note  Patient Details  Name: [name] [name] MRN: [id number] Date of Birth: October 25, 1945 Referring Provider:     CARDIAC REHAB PHASE II ORIENTATION from 11/29/2017 in [place]  Referring Provider  Branch      Encounter Date: 01/24/2018  Check In: Session Check In - 01/24/18 0930      Check-In   Supervising physician immediately available to respond to emergencies  See telemetry face sheet for immediately available MD    Location  [facility]    Staff Present  Benay Pike, Exercise Physiologist;Debra Wynetta Emery, RN, BSN;Diane Coad, MS, EP, [facility], Exercise Physiologist    Medication changes reported      No    Fall or balance concerns reported     No    Tobacco Cessation  No Change    Warm-up and Cool-down  Performed as group-led instruction    Resistance Training Performed  Yes    VAD Patient?  No    PAD/SET Patient?  No      Pain Assessment   Currently in Pain?  No/denies    Pain Score  0-No pain    Multiple Pain Sites  No       Capillary Blood Glucose: No results found for this or any previous visit (from the past 24 hour(s)).    Social History   Tobacco Use  Smoking Status Former Smoker  . Types: Pipe  . Last attempt to quit: 06/07/2014  . Years since quitting: 3.6  Smokeless Tobacco Never Used    Goals Met:  Independence with exercise equipment Exercise tolerated well No report of cardiac concerns or symptoms Strength training completed today  Goals Unmet:  Not Applicable  Comments: Pt able to follow exercise prescription today without complaint.  Will continue to monitor for progression. Check out 10:30.   Dr. Kate Sable is Medical Director for [facility] Cardiac and Pulmonary Rehab.

## 2018-01-25 ENCOUNTER — Ambulatory Visit: Payer: Self-pay

## 2018-01-26 ENCOUNTER — Encounter (HOSPITAL_COMMUNITY)
Admission: RE | Admit: 2018-01-26 | Discharge: 2018-01-26 | Disposition: A | Discharge status: Home / Self Care | Payer: Medicare Other | Source: Ambulatory Visit | Attending: Cardiology | Admitting: Cardiology

## 2018-01-26 ENCOUNTER — Other Ambulatory Visit: Payer: Self-pay

## 2018-01-26 DIAGNOSIS — Z955 Presence of coronary angioplasty implant and graft: Secondary | ICD-10-CM

## 2018-01-26 DIAGNOSIS — I213 ST elevation (STEMI) myocardial infarction of unspecified site: Secondary | ICD-10-CM | POA: Diagnosis not present

## 2018-01-26 NOTE — Progress Notes (Signed)
Daily Session Note  Patient Details  Name: [name] [name] MRN: [id number] Date of Birth: September 14, 1945 Referring Provider:     CARDIAC REHAB PHASE II ORIENTATION from 11/29/2017 in [place]  Referring Provider  Branch      Encounter Date: 01/26/2018  Check In: Session Check In - 01/26/18 0930      Check-In   Supervising physician immediately available to respond to emergencies  See telemetry face sheet for immediately available MD    Location  [facility]    Staff Present  Benay Pike, Exercise Physiologist;Lillee Mooneyhan Wynetta Emery, RN, BSN    Medication changes reported      No    Fall or balance concerns reported     No    Warm-up and Cool-down  Performed as group-led instruction    Resistance Training Performed  Yes    VAD Patient?  No    PAD/SET Patient?  No      Pain Assessment   Currently in Pain?  No/denies    Pain Score  0-No pain    Multiple Pain Sites  No       Capillary Blood Glucose: No results found for this or any previous visit (from the past 24 hour(s)).    Social History   Tobacco Use  Smoking Status Former Smoker  . Types: Pipe  . Last attempt to quit: 06/07/2014  . Years since quitting: 3.6  Smokeless Tobacco Never Used    Goals Met:  Independence with exercise equipment Exercise tolerated well No report of cardiac concerns or symptoms Strength training completed today  Goals Unmet:  Not Applicable  Comments: Pt able to follow exercise prescription today without complaint.  Will continue to monitor for progression. Check out 1030.   Dr. Kate Sable is Medical Director for [facility] Cardiac and Pulmonary Rehab.

## 2018-01-29 ENCOUNTER — Encounter (HOSPITAL_COMMUNITY)
Admission: RE | Admit: 2018-01-29 | Discharge: 2018-01-29 | Disposition: A | Discharge status: Home / Self Care | Payer: Medicare Other | Source: Ambulatory Visit | Attending: Cardiology | Admitting: Cardiology

## 2018-01-29 DIAGNOSIS — Z955 Presence of coronary angioplasty implant and graft: Secondary | ICD-10-CM | POA: Diagnosis not present

## 2018-01-29 DIAGNOSIS — I213 ST elevation (STEMI) myocardial infarction of unspecified site: Secondary | ICD-10-CM

## 2018-01-29 NOTE — Progress Notes (Signed)
Daily Session Note  Patient Details  Name: [name] [name] MRN: [id number] Date of Birth: 1945-04-27 Referring Provider:     CARDIAC REHAB PHASE II ORIENTATION from 11/29/2017 in [place]  Referring Provider  Branch      Encounter Date: 01/29/2018  Check In: Session Check In - 01/29/18 0930      Check-In   Supervising physician immediately available to respond to emergencies  See telemetry face sheet for immediately available MD    Location  [facility]    Staff Present  Aundra Dubin, RN, BSN;Diane Coad, MS, EP, [facility], Exercise Physiologist    Medication changes reported      No    Fall or balance concerns reported     No    Warm-up and Cool-down  Performed as group-led instruction    Resistance Training Performed  Yes    VAD Patient?  No    PAD/SET Patient?  No      Pain Assessment   Currently in Pain?  No/denies    Pain Score  0-No pain    Multiple Pain Sites  No       Capillary Blood Glucose: No results found for this or any previous visit (from the past 24 hour(s)).    Social History   Tobacco Use  Smoking Status Former Smoker  . Types: Pipe  . Last attempt to quit: 06/07/2014  . Years since quitting: 3.6  Smokeless Tobacco Never Used    Goals Met:  Independence with exercise equipment Exercise tolerated well No report of cardiac concerns or symptoms Strength training completed today  Goals Unmet:  Not Applicable  Comments: Pt able to follow exercise prescription today without complaint.  Will continue to monitor for progression. Check out 1030.   Dr. Kate Sable is Medical Director for [facility] Cardiac and Pulmonary Rehab.

## 2018-01-30 DIAGNOSIS — B351 Tinea unguium: Secondary | ICD-10-CM | POA: Diagnosis not present

## 2018-01-31 ENCOUNTER — Encounter (HOSPITAL_COMMUNITY)
Admission: RE | Admit: 2018-01-31 | Discharge: 2018-01-31 | Disposition: A | Discharge status: Home / Self Care | Payer: Medicare Other | Source: Ambulatory Visit | Attending: Cardiology | Admitting: Cardiology

## 2018-01-31 DIAGNOSIS — I213 ST elevation (STEMI) myocardial infarction of unspecified site: Secondary | ICD-10-CM

## 2018-01-31 DIAGNOSIS — Z955 Presence of coronary angioplasty implant and graft: Secondary | ICD-10-CM | POA: Diagnosis not present

## 2018-01-31 NOTE — Progress Notes (Signed)
Daily Session Note  Patient Details  Name: [name] [name] MRN: [id number] Date of Birth: 09/16/1945 Referring Provider:     CARDIAC REHAB PHASE II ORIENTATION from 11/29/2017 in [place] CARDIAC REHABILITATION  Referring Provider  Branch      Encounter Date: 01/31/2018  Check In: Session Check In - 01/31/18 0930      Check-In   Supervising physician immediately available to respond to emergencies  See telemetry face sheet for immediately available MD    Location  [facility]    Staff Present  Diane Coad, MS, EP, [facility], Exercise Physiologist;Debra Johnson, RN, BSN    Medication changes reported      No    Fall or balance concerns reported     No    Tobacco Cessation  No Change    Warm-up and Cool-down  Performed as group-led instruction    Resistance Training Performed  Yes    VAD Patient?  No    PAD/SET Patient?  No      Pain Assessment   Currently in Pain?  No/denies    Pain Score  0-No pain    Multiple Pain Sites  No       Capillary Blood Glucose: No results found for this or any previous visit (from the past 24 hour(s)).    Social History   Tobacco Use  Smoking Status Former Smoker  . Types: Pipe  . Last attempt to quit: 06/07/2014  . Years since quitting: 3.6  Smokeless Tobacco Never Used    Goals Met:  Independence with exercise equipment Exercise tolerated well Personal goals reviewed No report of cardiac concerns or symptoms Strength training completed today  Goals Unmet:  Not Applicable  Comments: Check out: 1030   Dr. Suresh Koneswaran is Medical Director for [place] Cardiac and Pulmonary Rehab. 

## 2018-02-02 ENCOUNTER — Encounter (HOSPITAL_COMMUNITY)
Admission: RE | Admit: 2018-02-02 | Discharge: 2018-02-02 | Disposition: A | Discharge status: Home / Self Care | Payer: Medicare Other | Source: Ambulatory Visit | Attending: Cardiology | Admitting: Cardiology

## 2018-02-02 DIAGNOSIS — I213 ST elevation (STEMI) myocardial infarction of unspecified site: Secondary | ICD-10-CM

## 2018-02-02 DIAGNOSIS — Z955 Presence of coronary angioplasty implant and graft: Secondary | ICD-10-CM

## 2018-02-02 NOTE — Progress Notes (Signed)
Daily Session Note  Patient Details  Name: ISAIAHS CHANCY MRN: [id number] Date of Birth: 04-03-1945 Referring Provider:     CARDIAC REHAB PHASE II ORIENTATION from 11/29/2017 in [place]  Referring Provider  Branch      Encounter Date: 02/02/2018  Check In: Session Check In - 02/02/18 0930      Check-In   Supervising physician immediately available to respond to emergencies  See telemetry face sheet for immediately available MD    Location  [facility]    Staff Present  Russella Dar, MS, EP, [facility], Exercise Physiologist;Markon Jares Wynetta Emery, RN, [facility], Exercise Physiologist    Medication changes reported      No    Fall or balance concerns reported     No    Warm-up and Cool-down  Performed as group-led instruction    Resistance Training Performed  Yes    VAD Patient?  No    PAD/SET Patient?  No      Pain Assessment   Currently in Pain?  No/denies    Pain Score  0-No pain    Multiple Pain Sites  No       Capillary Blood Glucose: No results found for this or any previous visit (from the past 24 hour(s)).    Social History   Tobacco Use  Smoking Status Former Smoker  . Types: Pipe  . Last attempt to quit: 06/07/2014  . Years since quitting: 3.6  Smokeless Tobacco Never Used    Goals Met:  Independence with exercise equipment Exercise tolerated well No report of cardiac concerns or symptoms Strength training completed today  Goals Unmet:  Not Applicable  Comments: Pt able to follow exercise prescription today without complaint.  Will continue to monitor for progression. Check out 1030.   Dr. Kate Sable is Medical Director for [facility] Cardiac and Pulmonary Rehab.

## 2018-02-05 ENCOUNTER — Encounter (HOSPITAL_COMMUNITY)
Admission: RE | Admit: 2018-02-05 | Discharge: 2018-02-05 | Disposition: A | Discharge status: Home / Self Care | Payer: Medicare Other | Source: Ambulatory Visit | Attending: Cardiology | Admitting: Cardiology

## 2018-02-05 DIAGNOSIS — I213 ST elevation (STEMI) myocardial infarction of unspecified site: Secondary | ICD-10-CM | POA: Diagnosis not present

## 2018-02-05 DIAGNOSIS — Z955 Presence of coronary angioplasty implant and graft: Secondary | ICD-10-CM | POA: Diagnosis not present

## 2018-02-05 NOTE — Progress Notes (Signed)
Daily Session Note  Patient Details  Name: LINK BURGESON MRN: [id number] Date of Birth: 16-Sep-1945 Referring Provider:     CARDIAC REHAB PHASE II ORIENTATION from 11/29/2017 in [place]  Referring Provider  Branch      Encounter Date: 02/05/2018  Check In: Session Check In - 02/05/18 0930      Check-In   Supervising physician immediately available to respond to emergencies  See telemetry face sheet for immediately available MD    Location  [facility]    Staff Present  Russella Dar, MS, EP, [facility], Exercise Physiologist;Lakeeta Dobosz Wynetta Emery, RN, [facility], Exercise Physiologist    Medication changes reported      No    Fall or balance concerns reported     No    Warm-up and Cool-down  Performed as group-led instruction    Resistance Training Performed  Yes    VAD Patient?  No    PAD/SET Patient?  No      Pain Assessment   Currently in Pain?  No/denies    Pain Score  0-No pain    Multiple Pain Sites  No       Capillary Blood Glucose: No results found for this or any previous visit (from the past 24 hour(s)).    Social History   Tobacco Use  Smoking Status Former Smoker  . Types: Pipe  . Last attempt to quit: 06/07/2014  . Years since quitting: 3.6  Smokeless Tobacco Never Used    Goals Met:  Independence with exercise equipment Exercise tolerated well No report of cardiac concerns or symptoms Strength training completed today  Goals Unmet:  Not Applicable  Comments: Pt able to follow exercise prescription today without complaint.  Will continue to monitor for progression. Check out 1030.   Dr. Kate Sable is Medical Director for [facility] Cardiac and Pulmonary Rehab.

## 2018-02-06 DIAGNOSIS — Z0000 Encounter for general adult medical examination without abnormal findings: Secondary | ICD-10-CM | POA: Diagnosis not present

## 2018-02-06 DIAGNOSIS — Z23 Encounter for immunization: Secondary | ICD-10-CM | POA: Diagnosis not present

## 2018-02-07 ENCOUNTER — Encounter (HOSPITAL_COMMUNITY)
Admission: RE | Admit: 2018-02-07 | Discharge: 2018-02-07 | Disposition: A | Discharge status: Home / Self Care | Payer: Medicare Other | Source: Ambulatory Visit | Attending: Cardiology | Admitting: Cardiology

## 2018-02-07 DIAGNOSIS — I213 ST elevation (STEMI) myocardial infarction of unspecified site: Secondary | ICD-10-CM

## 2018-02-07 DIAGNOSIS — Z955 Presence of coronary angioplasty implant and graft: Secondary | ICD-10-CM | POA: Diagnosis not present

## 2018-02-07 NOTE — Progress Notes (Signed)
Daily Session Note  Patient Details  Name: [name] [name] MRN: [id number] Date of Birth: December 18, 1945 Referring Provider:     CARDIAC REHAB PHASE II ORIENTATION from 11/29/2017 in [place]  Referring Provider  Branch      Encounter Date: 02/07/2018  Check In: Session Check In - 02/07/18 0930      Check-In   Supervising physician immediately available to respond to emergencies  See telemetry face sheet for immediately available MD    Location  [facility]    Staff Present  Russella Dar, MS, EP, [facility], Exercise Physiologist;Nicco Reaume Wynetta Emery, RN, [facility], Exercise Physiologist    Medication changes reported      No    Fall or balance concerns reported     No    Warm-up and Cool-down  Performed as group-led instruction    Resistance Training Performed  Yes    VAD Patient?  No    PAD/SET Patient?  No      Pain Assessment   Currently in Pain?  No/denies    Pain Score  0-No pain    Multiple Pain Sites  No       Capillary Blood Glucose: No results found for this or any previous visit (from the past 24 hour(s)).    Social History   Tobacco Use  Smoking Status Former Smoker  . Types: Pipe  . Last attempt to quit: 06/07/2014  . Years since quitting: 3.6  Smokeless Tobacco Never Used    Goals Met:  Independence with exercise equipment Exercise tolerated well No report of cardiac concerns or symptoms Strength training completed today  Goals Unmet:  Not Applicable  Comments: Pt able to follow exercise prescription today without complaint.  Will continue to monitor for progression. Check out 1030.   Dr. Kate Sable is Medical Director for [facility] Cardiac and Pulmonary Rehab.

## 2018-02-09 ENCOUNTER — Encounter (HOSPITAL_COMMUNITY)
Admission: RE | Admit: 2018-02-09 | Discharge: 2018-02-09 | Disposition: A | Discharge status: Home / Self Care | Payer: Medicare Other | Source: Ambulatory Visit | Attending: Cardiology | Admitting: Cardiology

## 2018-02-09 DIAGNOSIS — I213 ST elevation (STEMI) myocardial infarction of unspecified site: Secondary | ICD-10-CM | POA: Diagnosis not present

## 2018-02-09 DIAGNOSIS — Z955 Presence of coronary angioplasty implant and graft: Secondary | ICD-10-CM | POA: Diagnosis not present

## 2018-02-09 NOTE — Progress Notes (Signed)
Daily Session Note  Patient Details  Name: [name] [name] MRN: [id number] Date of Birth: 02-Sep-1945 Referring Provider:     CARDIAC REHAB PHASE II ORIENTATION from 11/29/2017 in [place]  Referring Provider  Branch      Encounter Date: 02/09/2018  Check In: Session Check In - 02/09/18 1020      Check-In   Location  [facility]    Staff Present  Odette Watanabe Angelina Pih, MS, EP, [facility], Exercise Physiologist;Debra Wynetta Emery, RN, [facility], Exercise Physiologist    Medication changes reported      No    Fall or balance concerns reported     No    Tobacco Cessation  No Change    Warm-up and Cool-down  Performed as group-led instruction    Resistance Training Performed  Yes    VAD Patient?  No    PAD/SET Patient?  No      Pain Assessment   Currently in Pain?  No/denies    Pain Score  0-No pain    Multiple Pain Sites  No       Capillary Blood Glucose: No results found for this or any previous visit (from the past 24 hour(s)).    Social History   Tobacco Use  Smoking Status Former Smoker  . Types: Pipe  . Last attempt to quit: 06/07/2014  . Years since quitting: 3.6  Smokeless Tobacco Never Used    Goals Met:  Independence with exercise equipment Exercise tolerated well Personal goals reviewed No report of cardiac concerns or symptoms Strength training completed today  Goals Unmet:  Not Applicable  Comments: Check out: 1030   Dr. Kate Sable is Medical Director for [place] and Pulmonary Rehab.

## 2018-02-12 ENCOUNTER — Encounter (HOSPITAL_COMMUNITY): Payer: Medicare Other

## 2018-02-12 NOTE — Progress Notes (Signed)
Cardiac Individual Treatment Plan  Patient Details  Name: [name] [name] MRN: [id number] Date of Birth: 05-05-1945 Referring Provider:     CARDIAC REHAB PHASE II ORIENTATION from 11/29/2017 in [place]  Referring Provider  Branch      Initial Encounter Date:    CARDIAC REHAB PHASE II ORIENTATION from 11/29/2017 in [place]  Date  11/29/17      Visit Diagnosis: ST elevation myocardial infarction (STEMI), unspecified artery [facility])  Status post coronary artery stent placement  Patient's Home Medications on Admission:  Current Outpatient Medications:  .  ALPRAZolam (XANAX) 0.25 MG tablet, Take 0.25 mg by mouth at bedtime as needed for sleep., Disp: , Rfl: 5 .  apixaban (ELIQUIS) 5 MG TABS tablet, Take 1 tablet (5 mg total) by mouth 2 (two) times daily., Disp: 180 tablet, Rfl: 3 .  Calcium Citrate-Vitamin D (CALCIUM CITRATE + D3 PO), Take 1 tablet by mouth daily., Disp: , Rfl:  .  carvedilol (COREG) 6.25 MG tablet, Take 1 tablet (6.25 mg total) by mouth 2 (two) times daily with a meal., Disp: 180 tablet, Rfl: 3 .  clopidogrel (PLAVIX) 75 MG tablet, Take 1 tablet (75 mg total) by mouth daily., Disp: 90 tablet, Rfl: 2 .  furosemide (LASIX) 40 MG tablet, Take 40 mg by mouth daily as needed for fluid or edema., Disp: , Rfl:  .  Krill Oil 350 MG CAPS, Take 350 mg by mouth daily., Disp: , Rfl:  .  Lactobacillus (ACIDOPHILUS PROBIOTIC) 10 MG TABS, Take 10 mg by mouth daily. , Disp: , Rfl:  .  losartan (COZAAR) 25 MG tablet, Take 1 tablet (25 mg total) by mouth daily., Disp: 90 tablet, Rfl: 3 .  Multiple Vitamins-Minerals (SENIOR VITES PO), Take 1 tablet by mouth daily., Disp: , Rfl:  .  nitroGLYCERIN (NITROSTAT) 0.4 MG SL tablet, Place 1 tablet (0.4 mg total) under the tongue every 5 (five) minutes as needed., Disp: 25 tablet, Rfl: 2 .  NON FORMULARY, Apply 1 application topically daily. Fluc2% Toe Nail Drops for Fungus, Disp: , Rfl:  .   rosuvastatin (CRESTOR) 40 MG tablet, Take 1 tablet (40 mg total) by mouth daily., Disp: 90 tablet, Rfl: 1 .  tamsulosin (FLOMAX) 0.4 MG CAPS capsule, Take 0.4 mg by mouth daily. , Disp: , Rfl:  .  tiotropium (SPIRIVA) 18 MCG inhalation capsule, Place 1 capsule (18 mcg total) into inhaler and inhale daily., Disp: 30 capsule, Rfl: 1  Past Medical History: Past Medical History:  Diagnosis Date  . Acute systolic heart failure ([place])   . COPD (chronic obstructive pulmonary disease) ([place])   . Hyperlipidemia   . Hypertension   . Low serum testosterone level   . PAF (paroxysmal atrial fibrillation) ([place])   . Pneumonia 05/2014  . Shortness of breath dyspnea   . STEMI (ST elevation myocardial infarction) ([place])    09/27/17 PCI/DES x1 mLAD, PTCA of the diag branch. EF 30% Lifevest at discharge.   . Urinary frequency     Tobacco Use: Social History   Tobacco Use  Smoking Status Former Smoker  . Types: Pipe  . Last attempt to quit: 06/07/2014  . Years since quitting: 3.6  Smokeless Tobacco Never Used    Labs: Recent Review Flowsheet Data    Labs for ITP Cardiac and Pulmonary Rehab Latest Ref Rng & Units 06/26/2014 06/28/2014 09/27/2017   PHART 7.350 - 7.450 7.491(H) 7.450 -   PCO2ART 35.0 - 45.0 mmHg 29.5(L) 34.2(L) -  HCO3 20.0 - 24.0 mEq/L 22.0 23.4 -   TCO2 22 - 32 mmol/L 22.8 24.5 22   ACIDBASEDEF 0.0 - 2.0 mmol/L 0.7 0.1 -   O2SAT % 91.5 95.6 -      Capillary Blood Glucose: Lab Results  Component Value Date   GLUCAP 114 (H) 06/07/2014     Exercise Target Goals: Exercise Program Goal: Individual exercise prescription set using results from initial 6 min walk test and THRR while considering  patient's activity barriers and safety.   Exercise Prescription Goal: Starting with aerobic activity 30 plus minutes a day, 3 days per week for initial exercise prescription. Provide home exercise prescription and guidelines that participant acknowledges understanding prior to  discharge.  Activity Barriers & Risk Stratification: Activity Barriers & Cardiac Risk Stratification - 11/29/17 1344      Activity Barriers & Cardiac Risk Stratification   Activity Barriers  Back Problems;Shortness of Breath   spinal fusion; COPD    Cardiac Risk Stratification  High       6 Minute Walk: 6 Minute Walk    Row Name 11/29/17 1343         6 Minute Walk   Phase  Initial     Distance  1700 feet     Walk Time  6 minutes     # of Rest Breaks  0     MPH  3.22     METS  3.47     RPE  9     Perceived Dyspnea   11     VO2 Peak  12.1     Symptoms  No     Resting HR  58 bpm     Resting BP  110/52     Resting Oxygen Saturation   97 %     Exercise Oxygen Saturation  during 6 min walk  94 %     Max Ex. HR  84 bpm     Max Ex. BP  126/60     2 Minute Post BP  110/58        Oxygen Initial Assessment:   Oxygen Re-Evaluation:   Oxygen Discharge (Final Oxygen Re-Evaluation):   Initial Exercise Prescription: Initial Exercise Prescription - 11/29/17 1300      Date of Initial Exercise RX and Referring Provider   Date  11/29/17    Referring Provider  Branch      Treadmill   MPH  2.5    Grade  0    Minutes  17    METs  2.91      Recumbant Elliptical   Level  1    RPM  49    Watts  55    Minutes  17    METs  4      Prescription Details   Frequency (times per week)  3    Duration  Progress to 30 minutes of continuous aerobic without signs/symptoms of physical distress      Intensity   THRR 40-80% of Max Heartrate  [phone]    Ratings of Perceived Exertion  11-13    Perceived Dyspnea  0-4      Progression   Progression  Continue to progress workloads to maintain intensity without signs/symptoms of physical distress.      Resistance Training   Training Prescription  Yes    Weight  1    Reps  10-15       Perform Capillary Blood Glucose checks as needed.  Exercise Prescription Changes:  Exercise Prescription Changes    Row Name 12/18/17 1400  01/09/18 0700 01/26/18 1500 02/08/18 0700       Response to Exercise   Blood Pressure (Admit)  98/62  104/58  94/50  100/52    Blood Pressure (Exercise)  90/60  110/52  110/60  90/50    Blood Pressure (Exit)  106/50  98/60  90/50  92/42    Heart Rate (Admit)  55 bpm  54 bpm  56 bpm  56 bpm    Heart Rate (Exercise)  96 bpm  111 bpm  109 bpm  83 bpm    Heart Rate (Exit)  61 bpm  62 bpm  65 bpm  65 bpm    Oxygen Saturation (Exit)  -  -  -  12 %    Rating of Perceived Exertion (Exercise)  '12  12  12  '$ -    Comments  -  increased overall MET level   increased overall MET level   -    Duration  Progress to 30 minutes of  aerobic without signs/symptoms of physical distress  Progress to 30 minutes of  aerobic without signs/symptoms of physical distress  Progress to 30 minutes of  aerobic without signs/symptoms of physical distress  Progress to 30 minutes of  aerobic without signs/symptoms of physical distress    Intensity  THRR unchanged  THRR unchanged  THRR unchanged  THRR unchanged      Progression   Progression  Continue to progress workloads to maintain intensity without signs/symptoms of physical distress.  Continue to progress workloads to maintain intensity without signs/symptoms of physical distress.  Continue to progress workloads to maintain intensity without signs/symptoms of physical distress.  Continue to progress workloads to maintain intensity without signs/symptoms of physical distress.    Average METs  4.2  4.65  5.04  5.04      Resistance Training   Training Prescription  Yes  Yes  Yes  Yes    Weight  '2  4  4  4 '$ will try increase to 5 lb, as tolerated.     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  Add 2 additional days to program exercise sessions.  Add 2 additional days to program exercise sessions.  Add 2 additional days to program exercise sessions.    Initial Home Exercises Provided  11/29/17  11/29/17  11/29/17  11/29/17  Exercise Comments:  Exercise Comments    Row Name 12/04/17 0757 12/25/17 1425 01/19/18 1501 02/12/18 1245     Exercise Comments  Patient has just started. He is doing well so far. Will continue to monitor progress and advance accordingly.   Patient has worked hard in the program and tolerated all increases with ease. He is eager to progress. We will continue to monitor and progress as we see fit.   He has worked hard to increase his overall MET level. Patient is up to 3.3 MPH and 1% grade on the treadmill and does it with ease. Will continue to monitor and progress his workloads as he improves strength.   Patient tolerates all increases with ease. He is up to 3.66mh ans 1.5% grade on the treadmill all while feeling like he's working at an 11 on the RPE scale. He is excited to return to work and feels like he has gained enough stamina to do so.        Exercise Goals and Review:  Exercise Goals    Row Name 11/29/17 1436             Exercise Goals   Increase Physical Activity  Yes       Intervention  Develop an individualized exercise prescription for aerobic and resistive training based on initial evaluation findings, risk stratification, comorbidities and participant's personal goals.       Increase Strength and Stamina  Yes       Intervention  Provide advice, education, support and counseling about physical activity/exercise needs.;Develop an  individualized exercise prescription for aerobic and resistive training based on initial evaluation findings, risk stratification, comorbidities and participant's personal goals.       Expected Outcomes  Short Term: Increase workloads from initial exercise prescription for resistance, speed, and METs.;Short Term: Perform resistance training exercises routinely during rehab and add in resistance training at home;Long Term: Improve cardiorespiratory fitness, muscular endurance and strength as measured by increased METs and functional capacity (6MWT)       Able to understand and use rate of perceived exertion (RPE) scale  Yes       Intervention  Provide education and explanation on how to use RPE scale       Expected Outcomes  Short Term: Able to use RPE daily in rehab to express subjective intensity level       Knowledge and understanding of Target Heart Rate Range (THRR)  Yes       Intervention  Provide education and explanation of THRR including how the numbers were predicted and where they are located for reference       Expected Outcomes  Short Term: Able to state/look up THRR;Long Term: Able to use THRR to govern intensity when exercising independently       Able to check pulse independently  Yes       Intervention  Provide education and demonstration on how to check pulse in carotid and radial arteries.       Expected Outcomes  Short Term: Able to explain why pulse checking is important during independent exercise;Long Term: Able to check pulse independently and accurately       Understanding of Exercise Prescription  Yes       Intervention  Provide education, explanation, and written materials on patient's individual exercise prescription       Expected Outcomes  Short Term: Able to explain program exercise prescription;Long Term: Able to explain home exercise prescription to exercise independently  Exercise Goals Re-Evaluation : Exercise Goals Re-Evaluation    Row Name 12/04/17 0753  12/25/17 1423 01/19/18 1459 02/12/18 1244       Exercise Goal Re-Evaluation   Exercise Goals Review  Increase Physical Activity;Able to understand and use rate of perceived exertion (RPE) scale;Knowledge and understanding of Target Heart Rate Range (THRR);Able to check pulse independently;Understanding of Exercise Prescription  Increase Physical Activity;Able to understand and use rate of perceived exertion (RPE) scale;Knowledge and understanding of Target Heart Rate Range (THRR);Able to check pulse independently;Understanding of Exercise Prescription  Increase Physical Activity;Able to understand and use rate of perceived exertion (RPE) scale;Knowledge and understanding of Target Heart Rate Range (THRR);Able to check pulse independently;Understanding of Exercise Prescription  Increase Physical Activity;Able to understand and use rate of perceived exertion (RPE) scale;Knowledge and understanding of Target Heart Rate Range (THRR);Able to check pulse independently;Understanding of Exercise Prescription    Comments  Patient has just started program. He has been to 2 visits. Patient is doing well in classes so far and we will continue to monitor for progress. We will progress his his equipment and weights every 2 weeks.   Paitent works hard in rehab everyday. He has had 12 sessoins so far. He is determined to get better and back to the same energy level he had before his STEMI. He has increased his overall MET level and weights.   Patient continues to work hard to push himself daily. He is supplementing his exercise here by going to the [facility] 2 days a week as well. He continues to progress with each session and has tolerated all workload increases with ease.    Patient always comes to class eager to work hard and get stronger. He is planning to return to work in January. He continues to exercise on his own as well at the [facility] and can see an improvement with his energy levels.     Expected Outcomes  Get back to road  trips and boogie boarding.   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   Increase energy and overall activity levels.   Increase energy and overall activity levels.         Discharge Exercise Prescription (Final Exercise Prescription Changes): Exercise Prescription Changes - 02/08/18 0700      Response to Exercise   Blood Pressure (Admit)  100/52    Blood Pressure (Exercise)  90/50    Blood Pressure (Exit)  92/42    Heart Rate (Admit)  56 bpm    Heart Rate (Exercise)  83 bpm    Heart Rate (Exit)  65 bpm    Oxygen Saturation (Exit)  12 %    Duration  Progress to 30 minutes of  aerobic without signs/symptoms of physical distress    Intensity  THRR unchanged      Progression   Progression  Continue to progress workloads to maintain intensity without signs/symptoms of physical distress.    Average METs  5.04      Resistance Training   Training Prescription  Yes    Weight  4   will try increase to 5 lb, as tolerated.    Reps  10-15    Time  5 Minutes      Treadmill   MPH  3.5    Grade  1.5    Minutes  17    METs  4.38      Recumbant Elliptical   Level  4    RPM  69  Watts  107    Minutes  22    METs  5.7      Home Exercise Plan   Plans to continue exercise at  [employer] (comment)    Frequency  Add 2 additional days to program exercise sessions.    Initial Home Exercises Provided  11/29/17       Nutrition:  Target Goals: Understanding of nutrition guidelines, daily intake of sodium '1500mg'$ , cholesterol '200mg'$ , calories 30% from fat and 7% or less from saturated fats, daily to have 5 or more servings of fruits and vegetables.  Biometrics: Pre Biometrics - 11/29/17 1346      Pre Biometrics   Height  6' (1.829 m)    Waist Circumference  36 inches    Hip Circumference  40 inches    Waist to Hip Ratio  0.9 %    BMI (Calculated)  26.37    Triceps Skinfold  5 mm    % Body Fat  20.6 %    Grip Strength  28.6 kg    Flexibility   0 in    Single Leg Stand  5.86 seconds        Nutrition Therapy Plan and Nutrition Goals: Nutrition Therapy & Goals - 12/21/17 1552      Personal Nutrition Goals   Nutrition Goal  For heart healthy choices add >50% of whole grains, make half their plate fruits and vegetables. Discuss the difference between starchy vegetables and leafy greens, and how leafy vegetables provide fiber, helps maintain healthy weight, helps control blood glucose, and lowers cholesterol.  Discuss purchasing fresh or frozen vegetable to reduce sodium and not to add grease, fat or sugar. Consume <18oz of red meat per week. Consume lean cuts of meats and very little of meats high in sodium and nitrates such as pork and lunch meats. Discussed portion control for all food groups.      Comments  Patient met with RD today 12/21/17.      Intervention Plan   Intervention  Nutrition handout(s) given to patient.    Expected Outcomes  Short Term Goal: Understand basic principles of dietary content, such as calories, fat, sodium, cholesterol and nutrients.       Nutrition Assessments: Nutrition Assessments - 11/29/17 1523      MEDFICTS Scores   Pre Score  27       Nutrition Goals Re-Evaluation: Nutrition Goals Re-Evaluation    Row Name 12/25/17 1455 01/22/18 1450 02/12/18 1451         Goals   Current Weight  192 lb (87.1 kg)  191 lb 1.6 oz (86.7 kg)  191 lb (86.6 kg)     Nutrition Goal  For heart healthy choices add >50% of whole grains, make half their plate fruits and vegetables. Discuss the difference between starchy vegetables and leafy greens, and how leafy vegetables provide fiber, helps maintain healthy weight, helps control blood glucose, and lowers cholesterol.  Discuss purchasing fresh or frozen vegetable to reduce sodium and not to add grease, fat or sugar. Consume <18oz of red meat per week. Consume lean cuts of meats and very little of meats high in sodium and nitrates such as pork and lunch meats.  Discussed portion control for all food groups.    For heart healthy choices add >50% of whole grains, make half their plate fruits and vegetables. Discuss the difference between starchy vegetables and leafy greens, and how leafy vegetables provide fiber, helps maintain healthy weight, helps control blood glucose, and  lowers cholesterol.  Discuss purchasing fresh or frozen vegetable to reduce sodium and not to add grease, fat or sugar. Consume <18oz of red meat per week. Consume lean cuts of meats and very little of meats high in sodium and nitrates such as pork and lunch meats. Discussed portion control for all food groups.    For heart healthy choices add >50% of whole grains, make half their plate fruits and vegetables. Discuss the difference between starchy vegetables and leafy greens, and how leafy vegetables provide fiber, helps maintain healthy weight, helps control blood glucose, and lowers cholesterol.  Discuss purchasing fresh or frozen vegetable to reduce sodium and not to add grease, fat or sugar. Consume <18oz of red meat per week. Consume lean cuts of meats and very little of meats high in sodium and nitrates such as pork and lunch meats. Discussed portion control for all food groups.       Comment  Patient has lost 2 lbs since his orientation visit. He has met with RD and says he is trying to follow a low sodium, low fat, heart healthy diet. Will continue to monitor for progress.   Patient has lost 1 lb since his orientation visit. He has met with RD and says he is trying to follow a low sodium, low fat, heart healthy diet. Will continue to monitor for progress.   Patient has maintained his weigth since last 30 day review. He continues to follow a low sodium, low fat, heart heatlhy diet. Will continue to monitor for progress.      Expected Outcome  -  -  Patient will continue to meet his nutritional goals.        Personal Goal #2 Re-Evaluation   Personal Goal #2  -  Patient eating low sodium, low  fat and heart healthy  -        Nutrition Goals Discharge (Final Nutrition Goals Re-Evaluation): Nutrition Goals Re-Evaluation - 02/12/18 1451      Goals   Current Weight  191 lb (86.6 kg)    Nutrition Goal  For heart healthy choices add >50% of whole grains, make half their plate fruits and vegetables. Discuss the difference between starchy vegetables and leafy greens, and how leafy vegetables provide fiber, helps maintain healthy weight, helps control blood glucose, and lowers cholesterol.  Discuss purchasing fresh or frozen vegetable to reduce sodium and not to add grease, fat or sugar. Consume <18oz of red meat per week. Consume lean cuts of meats and very little of meats high in sodium and nitrates such as pork and lunch meats. Discussed portion control for all food groups.      Comment  Patient has maintained his weigth since last 30 day review. He continues to follow a low sodium, low fat, heart heatlhy diet. Will continue to monitor for progress.     Expected Outcome  Patient will continue to meet his nutritional goals.        Psychosocial: Target Goals: Acknowledge presence or absence of significant depression and/or stress, maximize coping skills, provide positive support system. Participant is able to verbalize types and ability to use techniques and skills needed for reducing stress and depression.  Initial Review & Psychosocial Screening: Initial Psych Review & Screening - 11/29/17 1518      Initial Review   Current issues with  Current Depression;Current Stress Concerns    Source of Stress Concerns  Family   His recent health issues     Family Dynamics   Good  Support System?  Yes      Barriers   Psychosocial barriers to participate in program  The patient should benefit from training in stress management and relaxation.;Psychosocial barriers identified (see note)      Screening Interventions   Interventions  Encouraged to exercise    Expected Outcomes  Short Term goal:  Identification and review with participant of any Quality of Life or Depression concerns found by scoring the questionnaire.;Long Term goal: The participant improves quality of Life and PHQ9 Scores as seen by post scores and/or verbalization of changes       Quality of Life Scores: Quality of Life - 11/29/17 1408      Quality of Life   Select  Quality of Life      Quality of Life Scores   Health/Function Pre  19.73 %    Socioeconomic Pre  26.57 %    Psych/Spiritual Pre  15.93 %    Family Pre  24.2 %    GLOBAL Pre  19.73 %      Scores of 19 and below usually indicate a poorer quality of life in these areas.  A difference of  2-3 points is a clinically meaningful difference.  A difference of 2-3 points in the total score of the Quality of Life Index has been associated with significant improvement in overall quality of life, self-image, physical symptoms, and general health in studies assessing change in quality of life.  PHQ-9: Recent Review Flowsheet Data    Depression screen [facility] 2/9 01/19/2018 12/15/2017 11/29/2017 11/02/2017 10/19/2017   Decreased Interest 0 0 0 0 0   Down, Depressed, Hopeless 0 0 1 0 1   PHQ - 2 Score 0 0 1 0 1   Altered sleeping - - 1 - -   Tired, decreased energy - - 1 - -   Change in appetite - - 0 - -   Feeling bad or failure about yourself  - - 1 - -   Trouble concentrating - - 1 - -   Moving slowly or fidgety/restless - - 1 - -   Suicidal thoughts - - 1 - -   PHQ-9 Score - - 7 - -   Difficult doing work/chores - - Somewhat difficult - -     Interpretation of Total Score  Total Score Depression Severity:  1-4 = Minimal depression, 5-9 = Mild depression, 10-14 = Moderate depression, 15-19 = Moderately severe depression, 20-27 = Severe depression   Psychosocial Evaluation and Intervention: Psychosocial Evaluation - 11/29/17 1522      Psychosocial Evaluation & Interventions   Interventions  Encouraged to exercise with the program and follow exercise  prescription    Continue Psychosocial Services   Follow up required by staff       Psychosocial Re-Evaluation: Psychosocial Re-Evaluation    Row Name 12/25/17 1500 01/22/18 1453 02/12/18 1455         Psychosocial Re-Evaluation   Current issues with  None Identified  None Identified  None Identified     Comments  Patient initial QOL score was 19.73 and his PHQ-9 score was 7 with no issues identified.   Patient initial QOL score was 19.73 and his PHQ-9 score was 7 with no issues identified.   Patient initial QOL score was 19.73 and his PHQ-9 score was 7 with no issues identified.      Expected Outcomes  Patient will have no psychosocial issues identified at discharge.   Patient will have no psychosocial issues identified  at discharge.   Patient will have no psychosocial issues identified at discharge.      Interventions  Stress management education;Encouraged to attend Cardiac Rehabilitation for the exercise;Relaxation education  Stress management education;Encouraged to attend Cardiac Rehabilitation for the exercise;Relaxation education  Stress management education;Encouraged to attend Cardiac Rehabilitation for the exercise;Relaxation education     Continue Psychosocial Services   No Follow up required  No Follow up required  No Follow up required        Psychosocial Discharge (Final Psychosocial Re-Evaluation): Psychosocial Re-Evaluation - 02/12/18 1455      Psychosocial Re-Evaluation   Current issues with  None Identified    Comments  Patient initial QOL score was 19.73 and his PHQ-9 score was 7 with no issues identified.     Expected Outcomes  Patient will have no psychosocial issues identified at discharge.     Interventions  Stress management education;Encouraged to attend Cardiac Rehabilitation for the exercise;Relaxation education    Continue Psychosocial Services   No Follow up required       Vocational Rehabilitation: Provide vocational rehab assistance to qualifying  candidates.   Vocational Rehab Evaluation & Intervention: Vocational Rehab - 11/29/17 1524      Initial Vocational Rehab Evaluation & Intervention   Assessment shows need for Vocational Rehabilitation  No       Education: Education Goals: Education classes will be provided on a weekly basis, covering required topics. Participant will state understanding/return demonstration of topics presented.  Learning Barriers/Preferences: Learning Barriers/Preferences - 11/29/17 1523      Learning Barriers/Preferences   Learning Barriers  None    Learning Preferences  Verbal Instruction;Individual Instruction;Skilled Demonstration;Group Instruction       Education Topics: Hypertension, Hypertension Reduction -Define heart disease and high blood pressure. Discus how high blood pressure affects the body and ways to reduce high blood pressure.   CARDIAC REHAB PHASE II EXERCISE from 02/07/2018 in [place]  Date  01/24/18  Educator  D. Coad  Instruction Review Code  2- Demonstrated Understanding      Exercise and Your Heart -Discuss why it is important to exercise, the FITT principles of exercise, normal and abnormal responses to exercise, and how to exercise safely.   CARDIAC REHAB PHASE II EXERCISE from 02/07/2018 in [place]  Date  01/31/18  Educator  Coad  Instruction Review Code  2- Demonstrated Understanding      Angina -Discuss definition of angina, causes of angina, treatment of angina, and how to decrease risk of having angina.   CARDIAC REHAB PHASE II EXERCISE from 02/07/2018 in [place]  Date  02/07/18  Educator  Wynetta Emery  Instruction Review Code  2- Demonstrated Understanding      Cardiac Medications -Review what the following cardiac medications are used for, how they affect the body, and side effects that may occur when taking the medications.  Medications include Aspirin, Beta blockers, calcium  channel blockers, ACE Inhibitors, angiotensin receptor blockers, diuretics, digoxin, and antihyperlipidemics.   Congestive Heart Failure -Discuss the definition of CHF, how to live with CHF, the signs and symptoms of CHF, and how keep track of weight and sodium intake.   Heart Disease and Intimacy -Discus the effect sexual activity has on the heart, how changes occur during intimacy as we age, and safety during sexual activity.   Smoking Cessation / COPD -Discuss different methods to quit smoking, the health benefits of quitting smoking, and the definition of COPD.   CARDIAC  REHAB PHASE II EXERCISE from 02/07/2018 in [place]  Date  12/06/17  Educator  [place]  Instruction Review Code  2- Demonstrated Understanding      Nutrition I: Fats -Discuss the types of cholesterol, what cholesterol does to the heart, and how cholesterol levels can be controlled.   CARDIAC REHAB PHASE II EXERCISE from 02/07/2018 in [place]  Date  12/13/17  Educator  D. Coad  Instruction Review Code  2- Demonstrated Understanding      Nutrition II: Labels -Discuss the different components of food labels and how to read food label   [place] from 02/07/2018 in [place]  Date  12/20/17  Educator  Etheleen Mayhew  Instruction Review Code  2- Demonstrated Understanding      Heart Parts/Heart Disease and PAD -Discuss the anatomy of the heart, the pathway of blood circulation through the heart, and these are affected by heart disease.   CARDIAC REHAB PHASE II EXERCISE from 02/07/2018 in [place]  Date  12/27/17  Educator  Etheleen Mayhew   Instruction Review Code  2- Demonstrated Understanding      Stress I: Signs and Symptoms -Discuss the causes of stress, how stress may lead to anxiety and depression, and ways to limit stress.   CARDIAC REHAB PHASE II EXERCISE from 02/07/2018 in [place]  Date  01/03/18  Educator  D. Coad  Instruction Review Code  2- Demonstrated Understanding      Stress II: Relaxation -Discuss different types of relaxation techniques to limit stress.   CARDIAC REHAB PHASE II EXERCISE from 02/07/2018 in [place]  Date  01/10/18  Educator  D. Coad  Instruction Review Code  2- Demonstrated Understanding      Warning Signs of Stroke / TIA -Discuss definition of a stroke, what the signs and symptoms are of a stroke, and how to identify when someone is having stroke.   CARDIAC REHAB PHASE II EXERCISE from 02/07/2018 in [place]  Date  01/17/18  Educator  Etheleen Mayhew  Instruction Review Code  2- Demonstrated Understanding      Knowledge Questionnaire Score: Knowledge Questionnaire Score - 11/29/17 1523      Knowledge Questionnaire Score   Pre Score  23/24       Core Components/Risk Factors/Patient Goals at Admission: Personal Goals and Risk Factors at Admission - 11/29/17 1524      Core Components/Risk Factors/Patient Goals on Admission    Weight Management  Weight Maintenance    Personal Goal Other  Yes    Personal Goal  Increase physical and menatal energy to before having MI    Intervention  Exercise 3xweek in [facility] and supplement with home exercise 2 x week.     Expected Outcomes  Reach personal goals.       Core Components/Risk Factors/Patient Goals Review:  Goals and Risk Factor Review    Row Name 12/25/17 1456 01/22/18 1451 02/12/18 1452         Core Components/Risk Factors/Patient Goals Review   Personal Goals Review  Weight Management/Obesity Increase physical and mental energy to before MI; get back to road trips/boogie boarding. Get back to work.   Weight Management/Obesity Increase physical and mental energy to before MI; get back to road trips/boogie boarding.   Weight Management/Obesity Increase physical and mental energy to before MI; get back to  road trips/boogie boarding and work.  Review  Patient has completed 12 sessions losing 2 lbs since his initial visit. He is doing well in the program with progression. He is exercising outside of CR and wants to progress more. He says he is doing more on his own than what he does in rehab.  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 He does feel like he is making progress. Will continue to monitor for progress.   Patient has completed 24 sessions losing 1 lb since last 30 day review. He continues to do well in the program with progression and continue to exercise at the [facility] in addition to CR. 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 He says he has more energy and feels like doing more. Will continue to monitor for progress.   Patient has completed 32 sessions maintaining his weight since last 30 day review. He continues to do well in the program with progression. He continues to exercise at the [facility] in addition to CR. 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 He feels stronger both mentally and physically. Will continue to monitor.      Expected Outcomes  Patient will continue to attend sessions and complete the program meeting his personal goals.   Patient will continue to attend sessions and complete the program meeting his personal goals.   -        Core Components/Risk Factors/Patient Goals at Discharge (Final Review):  Goals and Risk Factor Review - 02/12/18 1452      Core Components/Risk Factors/Patient Goals Review   Personal Goals Review  Weight Management/Obesity   Increase physical and mental energy to before MI; get back to road trips/boogie boarding and work.    Review  Patient has completed 32 sessions maintaining his weight since last 30 day review. He continues to do well in the program with progression. He  continues to exercise at the [facility] in addition to CR. 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 He feels stronger both mentally and physically. Will continue to monitor.        ITP Comments: ITP Comments    Row Name 11/29/17 1514 12/04/17 1316         ITP Comments  Patient had back surgery May 2019. He is eager to get started and so that he can increase his physical and mental energy to before MI  Patient is new to program completing 3 sessions. Will continue to monitor for progress.          Comments: ITP 30 Day REVIEW Patient is doing well in the program. Will continue to monitor for progress.

## 2018-02-13 NOTE — Patient Outreach (Signed)
[facility]) Care Management  01/26/2018  TYREAK REAGLE [birth year] [id number]   Follow up outreach regarding emergency transportation reimbursement appeal. Spoke with Mrs. Michael who reported that member was out at the time of the call. She reported that the appeal was drafted, they were able to speak with staff from [facility] and pending feedback from the staff at [facility]. Provided information discussed with the Patient Experience POC. Informed that the representatives are willing to arrange a conference call if needed. Mrs. Milich stated that the appeal is due in December and agreed to contact [facility] if further assistance is required.   PLAN [facility] Case Closed.  [place] [phone]

## 2018-02-14 ENCOUNTER — Encounter (HOSPITAL_COMMUNITY): Payer: Medicare Other

## 2018-02-16 ENCOUNTER — Encounter (HOSPITAL_COMMUNITY): Payer: Medicare Other

## 2018-02-19 ENCOUNTER — Encounter (HOSPITAL_COMMUNITY)
Admission: RE | Admit: 2018-02-19 | Discharge: 2018-02-19 | Disposition: A | Discharge status: Home / Self Care | Payer: Medicare Other | Source: Ambulatory Visit | Attending: Cardiology | Admitting: Cardiology

## 2018-02-19 DIAGNOSIS — Z955 Presence of coronary angioplasty implant and graft: Secondary | ICD-10-CM | POA: Diagnosis not present

## 2018-02-19 DIAGNOSIS — I213 ST elevation (STEMI) myocardial infarction of unspecified site: Secondary | ICD-10-CM | POA: Insufficient documentation

## 2018-02-19 NOTE — Progress Notes (Signed)
Daily Session Note  Patient Details  Name: [name] [name] MRN: [id number] Date of Birth: 1945-05-30 Referring Provider:     CARDIAC REHAB PHASE II ORIENTATION from 11/29/2017 in [place]  Referring Provider  Branch      Encounter Date: 02/19/2018  Check In: Session Check In - 02/19/18 0930      Check-In   Supervising physician immediately available to respond to emergencies  See telemetry face sheet for immediately available MD    Location  [facility]    Staff Present  Aundra Dubin, RN, Cory Munch, Exercise Physiologist    Medication changes reported      No    Fall or balance concerns reported     No    Warm-up and Cool-down  Performed as group-led instruction    Resistance Training Performed  Yes    VAD Patient?  No    PAD/SET Patient?  No      Pain Assessment   Currently in Pain?  No/denies    Pain Score  0-No pain    Multiple Pain Sites  No       Capillary Blood Glucose: No results found for this or any previous visit (from the past 24 hour(s)).    Social History   Tobacco Use  Smoking Status Former Smoker  . Types: Pipe  . Last attempt to quit: 06/07/2014  . Years since quitting: 3.7  Smokeless Tobacco Never Used    Goals Met:  Independence with exercise equipment Exercise tolerated well No report of cardiac concerns or symptoms Strength training completed today  Goals Unmet:  Not Applicable  Comments: Pt able to follow exercise prescription today without complaint.  Will continue to monitor for progression. Check out 1030.   Dr. Kate Sable is Medical Director for [facility] Cardiac and Pulmonary Rehab.

## 2018-02-21 ENCOUNTER — Encounter (HOSPITAL_COMMUNITY)
Admission: RE | Admit: 2018-02-21 | Discharge: 2018-02-21 | Disposition: A | Discharge status: Home / Self Care | Payer: Medicare Other | Source: Ambulatory Visit | Attending: Cardiology | Admitting: Cardiology

## 2018-02-21 DIAGNOSIS — Z955 Presence of coronary angioplasty implant and graft: Secondary | ICD-10-CM | POA: Diagnosis not present

## 2018-02-21 DIAGNOSIS — I213 ST elevation (STEMI) myocardial infarction of unspecified site: Secondary | ICD-10-CM | POA: Diagnosis not present

## 2018-02-21 NOTE — Progress Notes (Signed)
Daily Session Note  Patient Details  Name: [name] [name] MRN: [id number] Date of Birth: 07/17/1945 Referring Provider:     CARDIAC REHAB PHASE II ORIENTATION from 11/29/2017 in [place]  Referring Provider  Branch      Encounter Date: 02/21/2018  Check In: Session Check In - 02/21/18 0930      Check-In   Supervising physician immediately available to respond to emergencies  See telemetry face sheet for immediately available MD    Location  [facility]    Staff Present  Aundra Dubin, RN, Cory Munch, Exercise Physiologist;Diane Coad, MS, EP, [facility], Exercise Physiologist    Medication changes reported      No    Fall or balance concerns reported     No    Tobacco Cessation  No Change    Warm-up and Cool-down  Performed as group-led instruction    Resistance Training Performed  Yes    VAD Patient?  No    PAD/SET Patient?  No      Pain Assessment   Currently in Pain?  No/denies    Pain Score  0-No pain    Multiple Pain Sites  No       Capillary Blood Glucose: No results found for this or any previous visit (from the past 24 hour(s)).    Social History   Tobacco Use  Smoking Status Former Smoker  . Types: Pipe  . Last attempt to quit: 06/07/2014  . Years since quitting: 3.7  Smokeless Tobacco Never Used    Goals Met:  Independence with exercise equipment Exercise tolerated well No report of cardiac concerns or symptoms Strength training completed today  Goals Unmet:  Not Applicable  Comments: Pt able to follow exercise prescription today without complaint.  Will continue to monitor for progression. Check out 1030.   Dr. Kate Sable is Medical Director for [facility] Cardiac and Pulmonary Rehab.

## 2018-02-22 ENCOUNTER — Encounter (HOSPITAL_COMMUNITY): Payer: Medicare Other

## 2018-02-22 DIAGNOSIS — H6121 Impacted cerumen, right ear: Secondary | ICD-10-CM | POA: Diagnosis not present

## 2018-02-22 DIAGNOSIS — I10 Essential (primary) hypertension: Secondary | ICD-10-CM | POA: Diagnosis not present

## 2018-02-23 ENCOUNTER — Encounter (HOSPITAL_COMMUNITY)
Admission: RE | Admit: 2018-02-23 | Discharge: 2018-02-23 | Disposition: A | Discharge status: Home / Self Care | Payer: Medicare Other | Source: Ambulatory Visit | Attending: Cardiology | Admitting: Cardiology

## 2018-02-23 VITALS — Ht 72.0 in | Wt 192.2 lb

## 2018-02-23 DIAGNOSIS — I213 ST elevation (STEMI) myocardial infarction of unspecified site: Secondary | ICD-10-CM | POA: Diagnosis not present

## 2018-02-23 DIAGNOSIS — Z955 Presence of coronary angioplasty implant and graft: Secondary | ICD-10-CM

## 2018-02-23 NOTE — Progress Notes (Signed)
Daily Session Note  Patient Details  Name: [name] [name] MRN: [id number] Date of Birth: 1946-03-21 Referring Provider:     CARDIAC REHAB PHASE II ORIENTATION from 11/29/2017 in [place]  Referring Provider  Branch      Encounter Date: 02/23/2018  Check In: Session Check In - 02/23/18 0930      Check-In   Supervising physician immediately available to respond to emergencies  See telemetry face sheet for immediately available MD    Location  [facility]    Staff Present  Aundra Dubin, RN, Cory Munch, Exercise Physiologist    Medication changes reported      No    Fall or balance concerns reported     No    Warm-up and Cool-down  Performed as group-led instruction    Resistance Training Performed  Yes    VAD Patient?  No    PAD/SET Patient?  No      Pain Assessment   Currently in Pain?  No/denies    Pain Score  0-No pain    Multiple Pain Sites  No       Capillary Blood Glucose: No results found for this or any previous visit (from the past 24 hour(s)).    Social History   Tobacco Use  Smoking Status Former Smoker  . Types: Pipe  . Last attempt to quit: 06/07/2014  . Years since quitting: 3.7  Smokeless Tobacco Never Used    Goals Met:  Independence with exercise equipment Exercise tolerated well No report of cardiac concerns or symptoms Strength training completed today  Goals Unmet:  Not Applicable  Comments: Pt able to follow exercise prescription today without complaint.  Will continue to monitor for progression. Check out 1030.    Dr. Kate Sable is Medical Director for [facility] Cardiac and Pulmonary Rehab.

## 2018-02-26 ENCOUNTER — Encounter (HOSPITAL_COMMUNITY)
Admission: RE | Admit: 2018-02-26 | Discharge: 2018-02-26 | Disposition: A | Discharge status: Home / Self Care | Payer: Medicare Other | Source: Ambulatory Visit | Attending: Cardiology | Admitting: Cardiology

## 2018-02-26 DIAGNOSIS — Z955 Presence of coronary angioplasty implant and graft: Secondary | ICD-10-CM

## 2018-02-26 DIAGNOSIS — I213 ST elevation (STEMI) myocardial infarction of unspecified site: Secondary | ICD-10-CM

## 2018-02-26 NOTE — Progress Notes (Signed)
Daily Session Note  Patient Details  Name: [name] [name] MRN: [id number] Date of Birth: 1945-11-15 Referring Provider:     CARDIAC REHAB PHASE II ORIENTATION from 11/29/2017 in [place]  Referring Provider  Branch      Encounter Date: 02/26/2018  Check In: Session Check In - 02/26/18 0930      Check-In   Supervising physician immediately available to respond to emergencies  See telemetry face sheet for immediately available MD    Location  [facility]    Staff Present  Aundra Dubin, RN, Cory Munch, Exercise Physiologist    Medication changes reported      No    Fall or balance concerns reported     No    Tobacco Cessation  No Change    Warm-up and Cool-down  Performed as group-led instruction    Resistance Training Performed  Yes    VAD Patient?  No    PAD/SET Patient?  No      Pain Assessment   Currently in Pain?  No/denies    Pain Score  0-No pain    Multiple Pain Sites  No       Capillary Blood Glucose: No results found for this or any previous visit (from the past 24 hour(s)).    Social History   Tobacco Use  Smoking Status Former Smoker  . Types: Pipe  . Last attempt to quit: 06/07/2014  . Years since quitting: 3.7  Smokeless Tobacco Never Used    Goals Met:  Independence with exercise equipment Exercise tolerated well No report of cardiac concerns or symptoms Strength training completed today  Goals Unmet:  Not Applicable  Comments: Pt able to follow exercise prescription today without complaint.  Will continue to monitor for progression. Check out 1030.   Dr. Kate Sable is Medical Director for [facility] Cardiac and Pulmonary Rehab.

## 2018-03-01 ENCOUNTER — Encounter: Payer: Self-pay | Admitting: Pulmonary Disease

## 2018-03-01 DIAGNOSIS — N183 Chronic kidney disease, stage 3 (moderate): Secondary | ICD-10-CM | POA: Diagnosis not present

## 2018-03-01 DIAGNOSIS — I10 Essential (primary) hypertension: Secondary | ICD-10-CM | POA: Diagnosis not present

## 2018-03-01 DIAGNOSIS — E785 Hyperlipidemia, unspecified: Secondary | ICD-10-CM | POA: Diagnosis not present

## 2018-03-01 DIAGNOSIS — I129 Hypertensive chronic kidney disease with stage 1 through stage 4 chronic kidney disease, or unspecified chronic kidney disease: Secondary | ICD-10-CM | POA: Diagnosis not present

## 2018-03-01 DIAGNOSIS — I2510 Atherosclerotic heart disease of native coronary artery without angina pectoris: Secondary | ICD-10-CM | POA: Diagnosis not present

## 2018-03-01 DIAGNOSIS — I38 Endocarditis, valve unspecified: Secondary | ICD-10-CM | POA: Diagnosis not present

## 2018-03-01 DIAGNOSIS — R7989 Other specified abnormal findings of blood chemistry: Secondary | ICD-10-CM | POA: Diagnosis not present

## 2018-03-01 DIAGNOSIS — J449 Chronic obstructive pulmonary disease, unspecified: Secondary | ICD-10-CM | POA: Diagnosis not present

## 2018-03-01 DIAGNOSIS — N401 Enlarged prostate with lower urinary tract symptoms: Secondary | ICD-10-CM | POA: Diagnosis not present

## 2018-03-01 NOTE — Progress Notes (Signed)
Discharge Progress Report  Patient Details  Name: [name] [name] MRN: [id number] Date of Birth: Sep 11, 1945 Referring Provider:     [facility] from 11/29/2017 in [place]  Referring Provider  Branch       Number of Visits: 42  Reason for Discharge:  Patient reached a stable level of exercise. Patient independent in their exercise. Patient has met program and personal goals.  Smoking History:  Social History   Tobacco Use  Smoking Status Former Smoker  . Types: Pipe  . Last attempt to quit: 06/07/2014  . Years since quitting: 3.7  Smokeless Tobacco Never Used    Diagnosis:  ST elevation myocardial infarction (STEMI), unspecified artery (HCC)  Status post coronary artery stent placement  ADL [facility]:   Initial Exercise Prescription: Initial Exercise Prescription - 11/29/17 1300      Date of Initial Exercise RX and Referring Provider   Date  11/29/17    Referring Provider  Branch      Treadmill   MPH  2.5    Grade  0    Minutes  17    METs  2.91      Recumbant Elliptical   Level  1    RPM  49    Watts  55    Minutes  17    METs  4      Prescription Details   Frequency (times per week)  3    Duration  Progress to 30 minutes of continuous aerobic without signs/symptoms of physical distress      Intensity   THRR 40-80% of Max Heartrate  88-112-130    Ratings of Perceived Exertion  11-13    Perceived Dyspnea  0-4      Progression   Progression  Continue to progress workloads to maintain intensity without signs/symptoms of physical distress.      Resistance Training   Training Prescription  Yes    Weight  1    Reps  10-15       Discharge Exercise Prescription (Final Exercise Prescription Changes): Exercise Prescription Changes - 02/20/18 0800      Response to Exercise   Blood Pressure (Admit)  96/50    Blood Pressure (Exercise)  90/50    Blood Pressure (Exit)  92/50    Heart Rate (Admit)  73 bpm     Heart Rate (Exercise)  85 bpm    Heart Rate (Exit)  76 bpm    Rating of Perceived Exertion (Exercise)  11    Duration  Progress to 30 minutes of  aerobic without signs/symptoms of physical distress    Intensity  THRR unchanged      Progression   Progression  Continue to progress workloads to maintain intensity without signs/symptoms of physical distress.    Average METs  4.94      Resistance Training   Training Prescription  Yes    Weight  4    Reps  10-15    Time  5 Minutes      Treadmill   MPH  3.5   going to increase for last few sessions if tolerated    Grade  1.5    Minutes  17    METs  4.38      Recumbant Elliptical   Level  4    RPM  65    Watts  99    Minutes  22    METs  5.5      Home  Exercise Plan   Plans to continue exercise at  [facility] (comment)    Frequency  Add 2 additional days to program exercise sessions.    Initial Home Exercises Provided  11/29/17       Functional Capacity: 6 Minute Walk    Row Name 11/29/17 1343 02/23/18 1410       6 Minute Walk   Phase  Initial  Discharge    Distance  1700 feet  2300 feet    Distance % Change  -  22 %    Distance Feet Change  -  600 ft    Walk Time  6 minutes  6 minutes    # of Rest Breaks  0  0    MPH  3.22  4.36    METS  3.47  4.34    RPE  9  12    Perceived Dyspnea   11  8    VO2 Peak  12.1  15.86    Symptoms  No  No    Resting HR  58 bpm  70 bpm    Resting BP  110/52  98/58    Resting Oxygen Saturation   97 %  98 %    Exercise Oxygen Saturation  during 6 min walk  94 %  89 %    Max Ex. HR  84 bpm  94 bpm    Max Ex. BP  126/60  114/60    2 Minute Post BP  110/58  96/62       Psychological, QOL, Others - Outcomes: PHQ 2/9: Depression screen [facility] 2/9 03/01/2018 01/19/2018 12/15/2017 11/29/2017 11/02/2017  Decreased Interest 0 0 0 0 0  Down, Depressed, Hopeless 0 0 0 1 0  PHQ - 2 Score 0 0 0 1 0  Altered sleeping 0 - - 1 -  Tired, decreased energy - - - 1 -  Change in appetite 2 - - 0  -  Feeling bad or failure about yourself  0 - - 1 -  Trouble concentrating 0 - - 1 -  Moving slowly or fidgety/restless 0 - - 1 -  Suicidal thoughts 0 - - 1 -  PHQ-9 Score 2 - - 7 -  Difficult doing work/chores Not difficult at all - - Somewhat difficult -    Quality of Life: Quality of Life - 02/19/18 1424      Quality of Life   Select  Quality of Life      Quality of Life Scores   Health/Function Pre  16.38 %    Health/Function Post  15.5 %    Health/Function % Change  -5.37 %    Socioeconomic Pre  26.57 %    Socioeconomic Post  24.5 %    Socioeconomic % Change   -7.79 %    Psych/Spiritual Pre  15.93 %    Psych/Spiritual Post  18.21 %    Psych/Spiritual % Change  14.31 %    Family Pre  24.2 %    Family Post  20.1 %    Family % Change  -16.94 %    GLOBAL Pre  19.73 %    GLOBAL Post  18.68 %    GLOBAL % Change  -5.32 %       Personal Goals: Goals established at orientation with interventions provided to work toward goal. Personal Goals and Risk Factors at Admission - 11/29/17 1524      Core Components/Risk Factors/Patient Goals on Admission    Weight  Management  Weight Maintenance    Personal Goal Other  Yes    Personal Goal  Increase physical and menatal energy to before having MI    Intervention  Exercise 3xweek in [facility] and supplement with home exercise 2 x week.     Expected Outcomes  Reach personal goals.        Personal Goals Discharge: Goals and Risk Factor Review    Row Name 12/25/17 1456 01/22/18 1451 02/12/18 1452 03/01/18 0805       Core Components/Risk Factors/Patient Goals Review   Personal Goals Review  Weight Management/Obesity Increase physical and mental energy to before MI; get back to road trips/boogie boarding. Get back to work.   Weight Management/Obesity Increase physical and mental energy to before MI; get back to road trips/boogie boarding.   Weight Management/Obesity Increase physical and mental energy to before MI; get back to road  trips/boogie boarding and work.   Weight Management/Obesity Increase physical and mental energy to before MI; get back to road trips and boogie boarding.     Review  Patient has completed 12 sessions losing 2 lbs since his initial visit. He is doing well in the program with progression. He is exercising outside of CR and wants to progress more. He says he is doing more on his own than what he does in rehab.  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 He does feel like he is making progress. Will continue to monitor for progress.   Patient has completed 24 sessions losing 1 lb since last 30 day review. He continues to do well in the program with progression and continue to exercise at the [facility] in addition to [facility]. 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 He says he has more energy and feels like doing more. Will continue to monitor for progress.   Patient has completed 32 sessions maintaining his weight since last 30 day review. He continues to do well in the program with progression. He continues to exercise at the [facility] in addition to [facility]. 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 He feels stronger both mentally and physically. Will continue to monitor.   Patient graduated with 36 sessions losing 3 lbs overall. He did well in the program. He says he meet his goals and wants to continue meeting his goals. His exit measurement improved in grip strength and balance. His exit walk test improved by 22%. His energy level is back mentally and physically to prior to MI. 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 He plans to continue exercising at the [facility]. CR will f/u for one year.    Expected Outcomes  Patient will continue to attend sessions and  complete the program meeting his personal goals.   Patient will continue to attend sessions and complete the program meeting his personal goals.   -  Patient will continue exercising at the [facility] and continue to meeting his personal goals.        Exercise Goals and Review: Exercise Goals    Row Name 11/29/17 1436             Exercise Goals   Increase Physical Activity  Yes  Intervention  Develop an individualized exercise prescription for aerobic and resistive training based on initial evaluation findings, risk stratification, comorbidities and participant's personal goals.       Increase Strength and Stamina  Yes       Intervention  Provide advice, education, support and counseling about physical activity/exercise needs.;Develop an individualized exercise prescription for aerobic and resistive training based on initial evaluation findings, risk stratification, comorbidities and participant's personal goals.       Expected Outcomes  Short Term: Increase workloads from initial exercise prescription for resistance, speed, and METs.;Short Term: Perform resistance training exercises routinely during rehab and add in resistance training at home;Long Term: Improve cardiorespiratory fitness, muscular endurance and strength as measured by increased METs and functional capacity (6MWT)       Able to understand and use rate of perceived exertion (RPE) scale  Yes       Intervention  Provide education and explanation on how to use RPE scale       Expected Outcomes  Short Term: Able to use RPE daily in rehab to express subjective intensity level       Knowledge and understanding of Target Heart Rate Range (THRR)  Yes       Intervention  Provide education and explanation of THRR including how the numbers were predicted and where they are located for reference       Expected Outcomes  Short Term: Able to state/look up THRR;Long Term: Able to use THRR to govern intensity when exercising independently        Able to check pulse independently  Yes       Intervention  Provide education and demonstration on how to check pulse in carotid and radial arteries.       Expected Outcomes  Short Term: Able to explain why pulse checking is important during independent exercise;Long Term: Able to check pulse independently and accurately       Understanding of Exercise Prescription  Yes       Intervention  Provide education, explanation, and written materials on patient's individual exercise prescription       Expected Outcomes  Short Term: Able to explain program exercise prescription;Long Term: Able to explain home exercise prescription to exercise independently          Exercise Goals Re-Evaluation: Exercise Goals Re-Evaluation    Row Name 12/04/17 0753 12/25/17 1423 01/19/18 1459 02/12/18 1244       Exercise Goal Re-Evaluation   Exercise Goals Review  Increase Physical Activity;Able to understand and use rate of perceived exertion (RPE) scale;Knowledge and understanding of Target Heart Rate Range (THRR);Able to check pulse independently;Understanding of Exercise Prescription  Increase Physical Activity;Able to understand and use rate of perceived exertion (RPE) scale;Knowledge and understanding of Target Heart Rate Range (THRR);Able to check pulse independently;Understanding of Exercise Prescription  Increase Physical Activity;Able to understand and use rate of perceived exertion (RPE) scale;Knowledge and understanding of Target Heart Rate Range (THRR);Able to check pulse independently;Understanding of Exercise Prescription  Increase Physical Activity;Able to understand and use rate of perceived exertion (RPE) scale;Knowledge and understanding of Target Heart Rate Range (THRR);Able to check pulse independently;Understanding of Exercise Prescription    Comments  Patient has just started program. He has been to 2 visits. Patient is doing well in classes so far and we will continue to monitor for progress. We will  progress his his equipment and weights every 2 weeks.   Paitent works hard in rehab everyday. He has had 12 sessoins so  far. He is determined to get better and back to the same energy level he had before his STEMI. He has increased his overall MET level and weights.   Patient continues to work hard to push himself daily. He is supplementing his exercise here by going to the [facility] 2 days a week as well. He continues to progress with each session and has tolerated all workload increases with ease.    Patient always comes to class eager to work hard and get stronger. He is planning to return to work in January. He continues to exercise on his own as well at the [facility] and can see an improvement with his energy levels.     Expected Outcomes  Get back to road trips and boogie boarding.   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   Increase energy and overall activity levels.   Increase energy and overall activity levels.        Nutrition & Weight - Outcomes: Pre Biometrics - 11/29/17 1346      Pre Biometrics   Height  6' (1.829 m)    Waist Circumference  36 inches    Hip Circumference  40 inches    Waist to Hip Ratio  0.9 %    BMI (Calculated)  26.37    Triceps Skinfold  5 mm    % Body Fat  20.6 %    Grip Strength  28.6 kg    Flexibility  0 in    Single Leg Stand  5.86 seconds      Post Biometrics - 02/23/18 1412       Post  Biometrics   Height  6' (1.829 m)    Weight  87.2 kg    Waist Circumference  36 inches    Hip Circumference  39 inches    Waist to Hip Ratio  0.92 %    BMI (Calculated)  26.07    Triceps Skinfold  6 mm    % Body Fat  21.2 %    Grip Strength  34.5 kg    Flexibility  0 in   spinal fusion 08/03/17   Single Leg Stand  8.23 seconds       Nutrition: Nutrition Therapy & Goals - 12/21/17 1552      Personal Nutrition Goals   Nutrition Goal  For heart healthy choices add >50% of whole grains, make half their plate fruits and vegetables.  Discuss the difference between starchy vegetables and leafy greens, and how leafy vegetables provide fiber, helps maintain healthy weight, helps control blood glucose, and lowers cholesterol.  Discuss purchasing fresh or frozen vegetable to reduce sodium and not to add grease, fat or sugar. Consume <18oz of red meat per week. Consume lean cuts of meats and very little of meats high in sodium and nitrates such as pork and lunch meats. Discussed portion control for all food groups.      Comments  Patient met with RD today 12/21/17.      Intervention Plan   Intervention  Nutrition handout(s) given to patient.    Expected Outcomes  Short Term Goal: Understand basic principles of dietary content, such as calories, fat, sodium, cholesterol and nutrients.       Nutrition Discharge: Nutrition Assessments - 03/01/18 0805      MEDFICTS Scores   Pre Score  27    Post Score  38    Score Difference  11       Education Questionnaire Score:  Knowledge Questionnaire Score - 03/01/18 0804      Knowledge Questionnaire Score   Pre Score  23/24    Post Score  24/24       Goals reviewed with patient; copy given to patient.

## 2018-03-01 NOTE — Progress Notes (Signed)
Cardiac Individual Treatment Plan  Patient Details  Name: [name] [name] MRN: [id number] Date of Birth: [birth year] Referring Provider:     CARDIAC REHAB PHASE II ORIENTATION from 11/29/2017 in [facility]  Referring Provider  Branch      Initial Encounter Date:    CARDIAC REHAB PHASE II ORIENTATION from 11/29/2017 in [facility]  Date  11/29/17      Visit Diagnosis: ST elevation myocardial infarction (STEMI), unspecified artery [facility])  Status post coronary artery stent placement  Patient's Home Medications on Admission:  Current Outpatient Medications:  .  ALPRAZolam (XANAX) 0.25 MG tablet, Take 0.25 mg by mouth at bedtime as needed for sleep., Disp: , Rfl: 5 .  apixaban (ELIQUIS) 5 MG TABS tablet, Take 1 tablet (5 mg total) by mouth 2 (two) times daily., Disp: 180 tablet, Rfl: 3 .  Calcium Citrate-Vitamin D (CALCIUM CITRATE + D3 PO), Take 1 tablet by mouth daily., Disp: , Rfl:  .  carvedilol (COREG) 6.25 MG tablet, Take 1 tablet (6.25 mg total) by mouth 2 (two) times daily with a meal., Disp: 180 tablet, Rfl: 3 .  clopidogrel (PLAVIX) 75 MG tablet, Take 1 tablet (75 mg total) by mouth daily., Disp: 90 tablet, Rfl: 2 .  furosemide (LASIX) 40 MG tablet, Take 40 mg by mouth daily as needed for fluid or edema., Disp: , Rfl:  .  Krill Oil 350 MG CAPS, Take 350 mg by mouth daily., Disp: , Rfl:  .  Lactobacillus (ACIDOPHILUS PROBIOTIC) 10 MG TABS, Take 10 mg by mouth daily. , Disp: , Rfl:  .  losartan (COZAAR) 25 MG tablet, Take 1 tablet (25 mg total) by mouth daily., Disp: 90 tablet, Rfl: 3 .  Multiple Vitamins-Minerals (SENIOR VITES PO), Take 1 tablet by mouth daily., Disp: , Rfl:  .  nitroGLYCERIN (NITROSTAT) 0.4 MG SL tablet, Place 1 tablet (0.4 mg total) under the tongue every 5 (five) minutes as needed., Disp: 25 tablet, Rfl: 2 .  NON FORMULARY, Apply 1 application topically daily. Fluc2% Toe Nail Drops for Fungus, Disp: , Rfl:  .   rosuvastatin (CRESTOR) 40 MG tablet, Take 1 tablet (40 mg total) by mouth daily., Disp: 90 tablet, Rfl: 1 .  tamsulosin (FLOMAX) 0.4 MG CAPS capsule, Take 0.4 mg by mouth daily. , Disp: , Rfl:  .  tiotropium (SPIRIVA) 18 MCG inhalation capsule, Place 1 capsule (18 mcg total) into inhaler and inhale daily., Disp: 30 capsule, Rfl: 1  Past Medical History: Past Medical History:  Diagnosis Date  . Acute systolic heart failure ([place])   . COPD (chronic obstructive pulmonary disease) ([place])   . Hyperlipidemia   . Hypertension   . Low serum testosterone level   . PAF (paroxysmal atrial fibrillation) ([place])   . Pneumonia 05/2014  . Shortness of breath dyspnea   . STEMI (ST elevation myocardial infarction) ([place])    09/27/17 PCI/DES x1 mLAD, PTCA of the diag branch. EF 30% Lifevest at discharge.   . Urinary frequency     Tobacco Use: Social History   Tobacco Use  Smoking Status Former Smoker  . Types: Pipe  . Last attempt to quit: 06/07/2014  . Years since quitting: 3.7  Smokeless Tobacco Never Used    Labs: Recent Review Scientist, physiological    Labs for ITP Cardiac and Pulmonary Rehab Latest Ref Rng & Units 06/26/2014 06/28/2014 09/27/2017   PHART 7.350 - 7.450 7.491(H) 7.450 -   PCO2ART 35.0 - 45.0 mmHg 29.5(L) 34.2(L) -  HCO3 20.0 - 24.0 mEq/L 22.0 23.4 -   TCO2 22 - 32 mmol/L 22.8 24.5 22   ACIDBASEDEF 0.0 - 2.0 mmol/L 0.7 0.1 -   O2SAT % 91.5 95.6 -      Capillary Blood Glucose: Lab Results  Component Value Date   GLUCAP 114 (H) 06/07/2014     Exercise Target Goals: Exercise Program Goal: Individual exercise prescription set using results from initial 6 min walk test and THRR while considering  patient's activity barriers and safety.   Exercise Prescription Goal: Starting with aerobic activity 30 plus minutes a day, 3 days per week for initial exercise prescription. Provide home exercise prescription and guidelines that participant acknowledges understanding prior to  discharge.  Activity Barriers & Risk Stratification: Activity Barriers & Cardiac Risk Stratification - 11/29/17 1344      Activity Barriers & Cardiac Risk Stratification   Activity Barriers  Back Problems;Shortness of Breath   spinal fusion; COPD    Cardiac Risk Stratification  High       6 Minute Walk: 6 Minute Walk    Row Name 11/29/17 1343 02/23/18 1410       6 Minute Walk   Phase  Initial  Discharge    Distance  1700 feet  2300 feet    Distance % Change  -  22 %    Distance Feet Change  -  600 ft    Walk Time  6 minutes  6 minutes    # of Rest Breaks  0  0    MPH  3.22  4.36    METS  3.47  4.34    RPE  9  12    Perceived Dyspnea   11  8    VO2 Peak  12.1  15.86    Symptoms  No  No    Resting HR  58 bpm  70 bpm    Resting BP  110/52  98/58    Resting Oxygen Saturation   97 %  98 %    Exercise Oxygen Saturation  during 6 min walk  94 %  89 %    Max Ex. HR  84 bpm  94 bpm    Max Ex. BP  126/60  114/60    2 Minute Post BP  110/58  96/62       Oxygen Initial Assessment:   Oxygen Re-Evaluation:   Oxygen Discharge (Final Oxygen Re-Evaluation):   Initial Exercise Prescription: Initial Exercise Prescription - 11/29/17 1300      Date of Initial Exercise RX and Referring Provider   Date  11/29/17    Referring Provider  Branch      Treadmill   MPH  2.5    Grade  0    Minutes  17    METs  2.91      Recumbant Elliptical   Level  1    RPM  49    Watts  55    Minutes  17    METs  4      Prescription Details   Frequency (times per week)  3    Duration  Progress to 30 minutes of continuous aerobic without signs/symptoms of physical distress      Intensity   THRR 40-80% of Max Heartrate  [phone]    Ratings of Perceived Exertion  11-13    Perceived Dyspnea  0-4      Progression   Progression  Continue to progress workloads to maintain intensity without signs/symptoms of  physical distress.      Resistance Training   Training Prescription  Yes     Weight  1    Reps  10-15       Perform Capillary Blood Glucose checks as needed.  Exercise Prescription Changes:  Exercise Prescription Changes    Row Name 12/18/17 1400 01/09/18 0700 01/26/18 1500 02/08/18 0700 02/20/18 0800     Response to Exercise   Blood Pressure (Admit)  98/62  104/58  94/50  100/52  96/50   Blood Pressure (Exercise)  90/60  110/52  110/60  90/50  90/50   Blood Pressure (Exit)  106/50  98/60  90/50  92/42  92/50   Heart Rate (Admit)  55 bpm  54 bpm  56 bpm  56 bpm  73 bpm   Heart Rate (Exercise)  96 bpm  111 bpm  109 bpm  83 bpm  85 bpm   Heart Rate (Exit)  61 bpm  62 bpm  65 bpm  65 bpm  76 bpm   Oxygen Saturation (Exit)  -  -  -  12 %  -   Rating of Perceived Exertion (Exercise)  '12  12  12  '$ -  11   Comments  -  increased overall MET level   increased overall MET level   -  -   Duration  Progress to 30 minutes of  aerobic without signs/symptoms of physical distress  Progress to 30 minutes of  aerobic without signs/symptoms of physical distress  Progress to 30 minutes of  aerobic without signs/symptoms of physical distress  Progress to 30 minutes of  aerobic without signs/symptoms of physical distress  Progress to 30 minutes of  aerobic without signs/symptoms of physical distress   Intensity  THRR unchanged  THRR unchanged  THRR unchanged  THRR unchanged  THRR unchanged     Progression   Progression  Continue to progress workloads to maintain intensity without signs/symptoms of physical distress.  Continue to progress workloads to maintain intensity without signs/symptoms of physical distress.  Continue to progress workloads to maintain intensity without signs/symptoms of physical distress.  Continue to progress workloads to maintain intensity without signs/symptoms of physical distress.  Continue to progress workloads to maintain intensity without signs/symptoms of physical distress.   Average METs  4.2  4.65  5.04  5.04  4.94     Resistance Training   Training  Prescription  Yes  Yes  Yes  Yes  Yes   Weight  '2  4  4  4 '$ will try increase to 5 lb, as tolerated.   4   Reps  10-15  10-15  10-15  10-15  10-15   Time  -  5 Minutes  5 Minutes  5 Minutes  5 Minutes     Treadmill   MPH  3  3.2  3.4  3.5  3.5 going to increase for last few sessions if tolerated    Grade  1  1  1.5  1.5  1.5   Minutes  '17  17  17  17  17   '$ METs  3.81  3.79  4.38  4.38  4.38     Recumbant Elliptical   Level  '2  3  4  4  4   '$ RPM  62  67  67  69  65   Watts  86  101  107  107  99   Minutes  '22  22  22  '$ 22  22   METs  4.6  5.5  5.7  5.7  5.5     Home Exercise Plan   Plans to continue exercise at  [employer] (comment) [employer] - cardio and Consulting civil engineer (comment)  Forensic scientist (comment)  Forensic scientist (comment)  Forensic scientist (comment)   Frequency  Add 2 additional days to program exercise sessions.  Add 2 additional days to program exercise sessions.  Add 2 additional days to program exercise sessions.  Add 2 additional days to program exercise sessions.  Add 2 additional days to program exercise sessions.   Initial Home Exercises Provided  11/29/17  11/29/17  11/29/17  11/29/17  11/29/17      Exercise Comments:  Exercise Comments    Row Name 12/04/17 0757 12/25/17 1425 01/19/18 1501 02/12/18 1245     Exercise Comments  Patient has just started. He is doing well so far. Will continue to monitor progress and advance accordingly.   Patient has worked hard in the program and tolerated all increases with ease. He is eager to progress. We will continue to monitor and progress as we see fit.   He has worked hard to increase his overall MET level. Patient is up to 3.3 MPH and 1% grade on the treadmill and does it with ease. Will continue to monitor and progress his workloads as he improves strength.   Patient tolerates all increases with ease. He is up to 3.81mh ans 1.5% grade on the treadmill all while feeling like he's working at an 11  on the RPE scale. He is excited to return to work and feels like he has gained enough stamina to do so.        Exercise Goals and Review:  Exercise Goals    Row Name 11/29/17 1436             Exercise Goals   Increase Physical Activity  Yes       Intervention  Develop an individualized exercise prescription for aerobic and resistive training based on initial evaluation findings, risk stratification, comorbidities and participant's personal goals.       Increase Strength and Stamina  Yes       Intervention  Provide advice, education, support and counseling about physical activity/exercise needs.;Develop an individualized exercise prescription for aerobic and resistive training based on initial evaluation findings, risk stratification, comorbidities and participant's personal goals.       Expected Outcomes  Short Term: Increase workloads from initial exercise prescription for resistance, speed, and METs.;Short Term: Perform resistance training exercises routinely during rehab and add in resistance training at home;Long Term: Improve cardiorespiratory fitness, muscular endurance and strength as measured by increased METs and functional capacity (6MWT)       Able to understand and use rate of perceived exertion (RPE) scale  Yes       Intervention  Provide education and explanation on how to use RPE scale       Expected Outcomes  Short Term: Able to use RPE daily in rehab to express subjective intensity level       Knowledge and understanding of Target Heart Rate Range (THRR)  Yes       Intervention  Provide education and explanation of THRR including how the numbers were predicted and where they are located for reference       Expected Outcomes  Short Term: Able to state/look up THRR;Long Term: Able to use THRR to govern intensity when exercising independently  Able to check pulse independently  Yes       Intervention  Provide education and demonstration on how to check pulse in carotid  and radial arteries.       Expected Outcomes  Short Term: Able to explain why pulse checking is important during independent exercise;Long Term: Able to check pulse independently and accurately       Understanding of Exercise Prescription  Yes       Intervention  Provide education, explanation, and written materials on patient's individual exercise prescription       Expected Outcomes  Short Term: Able to explain program exercise prescription;Long Term: Able to explain home exercise prescription to exercise independently          Exercise Goals Re-Evaluation : Exercise Goals Re-Evaluation    Row Name 12/04/17 0753 12/25/17 1423 01/19/18 1459 02/12/18 1244       Exercise Goal Re-Evaluation   Exercise Goals Review  Increase Physical Activity;Able to understand and use rate of perceived exertion (RPE) scale;Knowledge and understanding of Target Heart Rate Range (THRR);Able to check pulse independently;Understanding of Exercise Prescription  Increase Physical Activity;Able to understand and use rate of perceived exertion (RPE) scale;Knowledge and understanding of Target Heart Rate Range (THRR);Able to check pulse independently;Understanding of Exercise Prescription  Increase Physical Activity;Able to understand and use rate of perceived exertion (RPE) scale;Knowledge and understanding of Target Heart Rate Range (THRR);Able to check pulse independently;Understanding of Exercise Prescription  Increase Physical Activity;Able to understand and use rate of perceived exertion (RPE) scale;Knowledge and understanding of Target Heart Rate Range (THRR);Able to check pulse independently;Understanding of Exercise Prescription    Comments  Patient has just started program. He has been to 2 visits. Patient is doing well in classes so far and we will continue to monitor for progress. We will progress his his equipment and weights every 2 weeks.   Paitent works hard in rehab everyday. He has had 12 sessoins so far. He is  determined to get better and back to the same energy level he had before his STEMI. He has increased his overall MET level and weights.   Patient continues to work hard to push himself daily. He is supplementing his exercise here by going to the [facility] 2 days a week as well. He continues to progress with each session and has tolerated all workload increases with ease.    Patient always comes to class eager to work hard and get stronger. He is planning to return to work in January. He continues to exercise on his own as well at the [facility] and can see an improvement with his energy levels.     Expected Outcomes  Get back to road trips and boogie boarding.   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   Increase energy and overall activity levels.   Increase energy and overall activity levels.         Discharge Exercise Prescription (Final Exercise Prescription Changes): Exercise Prescription Changes - 02/20/18 0800      Response to Exercise   Blood Pressure (Admit)  96/50    Blood Pressure (Exercise)  90/50    Blood Pressure (Exit)  92/50    Heart Rate (Admit)  73 bpm    Heart Rate (Exercise)  85 bpm    Heart Rate (Exit)  76 bpm    Rating of Perceived Exertion (Exercise)  11    Duration  Progress to 30 minutes of  aerobic without signs/symptoms of physical  distress    Intensity  THRR unchanged      Progression   Progression  Continue to progress workloads to maintain intensity without signs/symptoms of physical distress.    Average METs  4.94      Resistance Training   Training Prescription  Yes    Weight  4    Reps  10-15    Time  5 Minutes      Treadmill   MPH  3.5   going to increase for last few sessions if tolerated    Grade  1.5    Minutes  17    METs  4.38      Recumbant Elliptical   Level  4    RPM  65    Watts  99    Minutes  22    METs  5.5      Home Exercise Plan   Plans to continue exercise at  [employer] (comment)    Frequency   Add 2 additional days to program exercise sessions.    Initial Home Exercises Provided  11/29/17       Nutrition:  Target Goals: Understanding of nutrition guidelines, daily intake of sodium '1500mg'$ , cholesterol '200mg'$ , calories 30% from fat and 7% or less from saturated fats, daily to have 5 or more servings of fruits and vegetables.  Biometrics: Pre Biometrics - 11/29/17 1346      Pre Biometrics   Height  6' (1.829 m)    Waist Circumference  36 inches    Hip Circumference  40 inches    Waist to Hip Ratio  0.9 %    BMI (Calculated)  26.37    Triceps Skinfold  5 mm    % Body Fat  20.6 %    Grip Strength  28.6 kg    Flexibility  0 in    Single Leg Stand  5.86 seconds      Post Biometrics - 02/23/18 1412       Post  Biometrics   Height  6' (1.829 m)    Weight  87.2 kg    Waist Circumference  36 inches    Hip Circumference  39 inches    Waist to Hip Ratio  0.92 %    BMI (Calculated)  26.07    Triceps Skinfold  6 mm    % Body Fat  21.2 %    Grip Strength  34.5 kg    Flexibility  0 in   spinal fusion 08/03/17   Single Leg Stand  8.23 seconds       Nutrition Therapy Plan and Nutrition Goals: Nutrition Therapy & Goals - 12/21/17 1552      Personal Nutrition Goals   Nutrition Goal  For heart healthy choices add >50% of whole grains, make half their plate fruits and vegetables. Discuss the difference between starchy vegetables and leafy greens, and how leafy vegetables provide fiber, helps maintain healthy weight, helps control blood glucose, and lowers cholesterol.  Discuss purchasing fresh or frozen vegetable to reduce sodium and not to add grease, fat or sugar. Consume <18oz of red meat per week. Consume lean cuts of meats and very little of meats high in sodium and nitrates such as pork and lunch meats. Discussed portion control for all food groups.      Comments  Patient met with RD today 12/21/17.      Intervention Plan   Intervention  Nutrition handout(s) given to  patient.    Expected Outcomes  Short  Term Goal: Understand basic principles of dietary content, such as calories, fat, sodium, cholesterol and nutrients.       Nutrition Assessments: Nutrition Assessments - 03/01/18 0805      MEDFICTS Scores   Pre Score  27    Post Score  38    Score Difference  11       Nutrition Goals Re-Evaluation: Nutrition Goals Re-Evaluation    Row Name 12/25/17 1455 01/22/18 1450 02/12/18 1451         Goals   Current Weight  192 lb (87.1 kg)  191 lb 1.6 oz (86.7 kg)  191 lb (86.6 kg)     Nutrition Goal  For heart healthy choices add >50% of whole grains, make half their plate fruits and vegetables. Discuss the difference between starchy vegetables and leafy greens, and how leafy vegetables provide fiber, helps maintain healthy weight, helps control blood glucose, and lowers cholesterol.  Discuss purchasing fresh or frozen vegetable to reduce sodium and not to add grease, fat or sugar. Consume <18oz of red meat per week. Consume lean cuts of meats and very little of meats high in sodium and nitrates such as pork and lunch meats. Discussed portion control for all food groups.    For heart healthy choices add >50% of whole grains, make half their plate fruits and vegetables. Discuss the difference between starchy vegetables and leafy greens, and how leafy vegetables provide fiber, helps maintain healthy weight, helps control blood glucose, and lowers cholesterol.  Discuss purchasing fresh or frozen vegetable to reduce sodium and not to add grease, fat or sugar. Consume <18oz of red meat per week. Consume lean cuts of meats and very little of meats high in sodium and nitrates such as pork and lunch meats. Discussed portion control for all food groups.    For heart healthy choices add >50% of whole grains, make half their plate fruits and vegetables. Discuss the difference between starchy vegetables and leafy greens, and how leafy vegetables provide fiber, helps maintain  healthy weight, helps control blood glucose, and lowers cholesterol.  Discuss purchasing fresh or frozen vegetable to reduce sodium and not to add grease, fat or sugar. Consume <18oz of red meat per week. Consume lean cuts of meats and very little of meats high in sodium and nitrates such as pork and lunch meats. Discussed portion control for all food groups.       Comment  Patient has lost 2 lbs since his orientation visit. He has met with RD and says he is trying to follow a low sodium, low fat, heart healthy diet. Will continue to monitor for progress.   Patient has lost 1 lb since his orientation visit. He has met with RD and says he is trying to follow a low sodium, low fat, heart healthy diet. Will continue to monitor for progress.   Patient has maintained his weigth since last 30 day review. He continues to follow a low sodium, low fat, heart heatlhy diet. Will continue to monitor for progress.      Expected Outcome  -  -  Patient will continue to meet his nutritional goals.        Personal Goal #2 Re-Evaluation   Personal Goal #2  -  Patient eating low sodium, low fat and heart healthy  -        Nutrition Goals Discharge (Final Nutrition Goals Re-Evaluation): Nutrition Goals Re-Evaluation - 02/12/18 1451      Goals   Current Weight  191  lb (86.6 kg)    Nutrition Goal  For heart healthy choices add >50% of whole grains, make half their plate fruits and vegetables. Discuss the difference between starchy vegetables and leafy greens, and how leafy vegetables provide fiber, helps maintain healthy weight, helps control blood glucose, and lowers cholesterol.  Discuss purchasing fresh or frozen vegetable to reduce sodium and not to add grease, fat or sugar. Consume <18oz of red meat per week. Consume lean cuts of meats and very little of meats high in sodium and nitrates such as pork and lunch meats. Discussed portion control for all food groups.      Comment  Patient has maintained his weigth since  last 30 day review. He continues to follow a low sodium, low fat, heart heatlhy diet. Will continue to monitor for progress.     Expected Outcome  Patient will continue to meet his nutritional goals.        Psychosocial: Target Goals: Acknowledge presence or absence of significant depression and/or stress, maximize coping skills, provide positive support system. Participant is able to verbalize types and ability to use techniques and skills needed for reducing stress and depression.  Initial Review & Psychosocial Screening: Initial Psych Review & Screening - 11/29/17 1518      Initial Review   Current issues with  Current Depression;Current Stress Concerns    Source of Stress Concerns  Family   His recent health issues     Family Dynamics   Good Support System?  Yes      Barriers   Psychosocial barriers to participate in program  The patient should benefit from training in stress management and relaxation.;Psychosocial barriers identified (see note)      Screening Interventions   Interventions  Encouraged to exercise    Expected Outcomes  Short Term goal: Identification and review with participant of any Quality of Life or Depression concerns found by scoring the questionnaire.;Long Term goal: The participant improves quality of Life and PHQ9 Scores as seen by post scores and/or verbalization of changes       Quality of Life Scores: Quality of Life - 02/19/18 1424      Quality of Life   Select  Quality of Life      Quality of Life Scores   Health/Function Pre  16.38 %    Health/Function Post  15.5 %    Health/Function % Change  -5.37 %    Socioeconomic Pre  26.57 %    Socioeconomic Post  24.5 %    Socioeconomic % Change   -7.79 %    Psych/Spiritual Pre  15.93 %    Psych/Spiritual Post  18.21 %    Psych/Spiritual % Change  14.31 %    Family Pre  24.2 %    Family Post  20.1 %    Family % Change  -16.94 %    GLOBAL Pre  19.73 %    GLOBAL Post  18.68 %    GLOBAL % Change   -5.32 %      Scores of 19 and below usually indicate a poorer quality of life in these areas.  A difference of  2-3 points is a clinically meaningful difference.  A difference of 2-3 points in the total score of the Quality of Life Index has been associated with significant improvement in overall quality of life, self-image, physical symptoms, and general health in studies assessing change in quality of life.  PHQ-9: Recent Review Flowsheet Data    Depression screen  [facility] 2/9 03/01/2018 01/19/2018 12/15/2017 11/29/2017 11/02/2017   Decreased Interest 0 0 0 0 0   Down, Depressed, Hopeless 0 0 0 1 0   PHQ - 2 Score 0 0 0 1 0   Altered sleeping 0 - - 1 -   Tired, decreased energy - - - 1 -   Change in appetite 2 - - 0 -   Feeling bad or failure about yourself  0 - - 1 -   Trouble concentrating 0 - - 1 -   Moving slowly or fidgety/restless 0 - - 1 -   Suicidal thoughts 0 - - 1 -   PHQ-9 Score 2 - - 7 -   Difficult doing work/chores Not difficult at all - - Somewhat difficult -     Interpretation of Total Score  Total Score Depression Severity:  1-4 = Minimal depression, 5-9 = Mild depression, 10-14 = Moderate depression, 15-19 = Moderately severe depression, 20-27 = Severe depression   Psychosocial Evaluation and Intervention: Psychosocial Evaluation - 03/01/18 0810      Discharge Psychosocial Assessment & Intervention   Comments  Patient has no psychosocial issues identified at discharge. His exit QOL score decreased by 5.33% at 18.68 and his PHQ-9 score initial was 7 and his exit was 2.        Psychosocial Re-Evaluation: Psychosocial Re-Evaluation    Row Name 12/25/17 1500 01/22/18 1453 02/12/18 1455         Psychosocial Re-Evaluation   Current issues with  None Identified  None Identified  None Identified     Comments  Patient initial QOL score was 19.73 and his PHQ-9 score was 7 with no issues identified.   Patient initial QOL score was 19.73 and his PHQ-9 score was 7 with no  issues identified.   Patient initial QOL score was 19.73 and his PHQ-9 score was 7 with no issues identified.      Expected Outcomes  Patient will have no psychosocial issues identified at discharge.   Patient will have no psychosocial issues identified at discharge.   Patient will have no psychosocial issues identified at discharge.      Interventions  Stress management education;Encouraged to attend Cardiac Rehabilitation for the exercise;Relaxation education  Stress management education;Encouraged to attend Cardiac Rehabilitation for the exercise;Relaxation education  Stress management education;Encouraged to attend Cardiac Rehabilitation for the exercise;Relaxation education     Continue Psychosocial Services   No Follow up required  No Follow up required  No Follow up required        Psychosocial Discharge (Final Psychosocial Re-Evaluation): Psychosocial Re-Evaluation - 02/12/18 1455      Psychosocial Re-Evaluation   Current issues with  None Identified    Comments  Patient initial QOL score was 19.73 and his PHQ-9 score was 7 with no issues identified.     Expected Outcomes  Patient will have no psychosocial issues identified at discharge.     Interventions  Stress management education;Encouraged to attend Cardiac Rehabilitation for the exercise;Relaxation education    Continue Psychosocial Services   No Follow up required       Vocational Rehabilitation: Provide vocational rehab assistance to qualifying candidates.   Vocational Rehab Evaluation & Intervention: Vocational Rehab - 11/29/17 1524      Initial Vocational Rehab Evaluation & Intervention   Assessment shows need for Vocational Rehabilitation  No       Education: Education Goals: Education classes will be provided on a weekly basis, covering required topics. Participant will state  understanding/return demonstration of topics presented.  Learning Barriers/Preferences: Learning Barriers/Preferences - 11/29/17 1523       Learning Barriers/Preferences   Learning Barriers  None    Learning Preferences  Verbal Instruction;Individual Instruction;Skilled Demonstration;Group Instruction       Education Topics: Hypertension, Hypertension Reduction -Define heart disease and high blood pressure. Discus how high blood pressure affects the body and ways to reduce high blood pressure.   CARDIAC REHAB PHASE II EXERCISE from 02/21/2018 in [place]  Date  01/24/18  Educator  D. Coad  Instruction Review Code  2- Demonstrated Understanding      Exercise and Your Heart -Discuss why it is important to exercise, the FITT principles of exercise, normal and abnormal responses to exercise, and how to exercise safely.   CARDIAC REHAB PHASE II EXERCISE from 02/21/2018 in [place]  Date  01/31/18  Educator  Coad  Instruction Review Code  2- Demonstrated Understanding      Angina -Discuss definition of angina, causes of angina, treatment of angina, and how to decrease risk of having angina.   CARDIAC REHAB PHASE II EXERCISE from 02/21/2018 in [place]  Date  02/07/18  Educator  Wynetta Emery  Instruction Review Code  2- Demonstrated Understanding      Cardiac Medications -Review what the following cardiac medications are used for, how they affect the body, and side effects that may occur when taking the medications.  Medications include Aspirin, Beta blockers, calcium channel blockers, ACE Inhibitors, angiotensin receptor blockers, diuretics, digoxin, and antihyperlipidemics.   Congestive Heart Failure -Discuss the definition of CHF, how to live with CHF, the signs and symptoms of CHF, and how keep track of weight and sodium intake.   CARDIAC REHAB PHASE II EXERCISE from 02/21/2018 in [place]  Date  02/21/18  Educator  Etheleen Mayhew  Instruction Review Code  2- Demonstrated Understanding      Heart Disease and  Intimacy -Discus the effect sexual activity has on the heart, how changes occur during intimacy as we age, and safety during sexual activity.   Smoking Cessation / COPD -Discuss different methods to quit smoking, the health benefits of quitting smoking, and the definition of COPD.   CARDIAC REHAB PHASE II EXERCISE from 02/21/2018 in [place]  Date  12/06/17  Educator  Etheleen Mayhew  Instruction Review Code  2- Demonstrated Understanding      Nutrition I: Fats -Discuss the types of cholesterol, what cholesterol does to the heart, and how cholesterol levels can be controlled.   CARDIAC REHAB PHASE II EXERCISE from 02/21/2018 in [place]  Date  12/13/17  Educator  D. Coad  Instruction Review Code  2- Demonstrated Understanding      Nutrition II: Labels -Discuss the different components of food labels and how to read food label   [place] from 02/21/2018 in [place]  Date  12/20/17  Educator  Etheleen Mayhew  Instruction Review Code  2- Demonstrated Understanding      Heart Parts/Heart Disease and PAD -Discuss the anatomy of the heart, the pathway of blood circulation through the heart, and these are affected by heart disease.   CARDIAC REHAB PHASE II EXERCISE from 02/21/2018 in [place]  Date  12/27/17  Educator  Etheleen Mayhew   Instruction Review Code  2- Demonstrated Understanding      Stress I: Signs and Symptoms -Discuss the causes of  stress, how stress may lead to anxiety and depression, and ways to limit stress.   CARDIAC REHAB PHASE II EXERCISE from 02/21/2018 in [facility]  Date  01/03/18  Educator  D. Coad  Instruction Review Code  2- Demonstrated Understanding      Stress II: Relaxation -Discuss different types of relaxation techniques to limit stress.   CARDIAC REHAB PHASE II EXERCISE from 02/21/2018 in [place]  Date  01/10/18  Educator  D. Coad  Instruction Review Code  2- Demonstrated Understanding      Warning Signs of Stroke / TIA -Discuss definition of a stroke, what the signs and symptoms are of a stroke, and how to identify when someone is having stroke.   CARDIAC REHAB PHASE II EXERCISE from 02/21/2018 in [place]  Date  01/17/18  Educator  Etheleen Mayhew  Instruction Review Code  2- Demonstrated Understanding      Knowledge Questionnaire Score: Knowledge Questionnaire Score - 03/01/18 0804      Knowledge Questionnaire Score   Pre Score  23/24    Post Score  24/24       Core Components/Risk Factors/Patient Goals at Admission: Personal Goals and Risk Factors at Admission - 11/29/17 1524      Core Components/Risk Factors/Patient Goals on Admission    Weight Management  Weight Maintenance    Personal Goal Other  Yes    Personal Goal  Increase physical and menatal energy to before having MI    Intervention  Exercise 3xweek in [facility] and supplement with home exercise 2 x week.     Expected Outcomes  Reach personal goals.       Core Components/Risk Factors/Patient Goals Review:  Goals and Risk Factor Review    Row Name 12/25/17 1456 01/22/18 1451 02/12/18 1452 03/01/18 0805       Core Components/Risk Factors/Patient Goals Review   Personal Goals Review  Weight Management/Obesity Increase physical and mental energy to before MI; get back to road trips/boogie boarding. Get back to work.   Weight Management/Obesity Increase physical and mental energy to before MI; get back to road trips/boogie boarding.   Weight Management/Obesity Increase physical and mental energy to before MI; get back to road trips/boogie boarding and work.   Weight Management/Obesity Increase physical and mental energy to before MI; get back to road trips and boogie boarding.     Review  Patient has completed 12 sessions losing 2 lbs since his initial visit. He is doing well in  the program with progression. He is exercising outside of [facility] and wants to progress more. He says he is doing more on his own than what he does in rehab.  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 He does feel like he is making progress. Will continue to monitor for progress.   Patient has completed 24 sessions losing 1 lb since last 30 day review. He continues to do well in the program with progression and continue to exercise at the [facility] in addition to [facility]. 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 He says he has more energy and feels like doing more. Will continue to monitor for progress.   Patient has completed 32 sessions maintaining his weight since last 30 day review. He continues to do well in the program with progression. He  continues to exercise at the [facility] in addition to [facility]. 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 He feels stronger both mentally and physically. Will continue to monitor.   Patient graduated with 36 sessions losing 3 lbs overall. He did well in the program. He says he meet his goals and wants to continue meeting his goals. His exit measurement improved in grip strength and balance. His exit walk test improved by 22%. His energy level is back mentally and physically to prior to MI. 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 He plans to continue exercising at the [facility]. CR will f/u for one year.    Expected Outcomes  Patient will continue to attend sessions and complete the program meeting his personal goals.   Patient will continue to attend sessions and complete the program meeting his personal goals.   -  Patient will continue exercising at the [facility] and continue to meeting his personal goals.        Core  Components/Risk Factors/Patient Goals at Discharge (Final Review):  Goals and Risk Factor Review - 03/01/18 0805      Core Components/Risk Factors/Patient Goals Review   Personal Goals Review  Weight Management/Obesity   Increase physical and mental energy to before MI; get back to road trips and boogie boarding.    Review  Patient graduated with 36 sessions losing 3 lbs overall. He did well in the program. He says he meet his goals and wants to continue meeting his goals. His exit measurement improved in grip strength and balance. His exit walk test improved by 22%. His energy level is back mentally and physically to prior to MI. 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 He plans to continue exercising at the [facility]. CR will f/u for one year.    Expected Outcomes  Patient will continue exercising at the [facility] and continue to meeting his personal goals.        ITP Comments: ITP Comments    Row Name 11/29/17 1514 12/04/17 1316         ITP Comments  Patient had back surgery May 2019. He is eager to get started and so that he can increase his physical and mental energy to before MI  Patient is new to program completing 3 sessions. Will continue to monitor for progress.          Comments: Patient graduated from [facility] today on 02/26/18 after completing 36 sessions. He achieved LTG of 30 minutes of aerobic exercise at Max Met level of 6.7. All patients vitals are WNL. Patient has met with dietician. Discharge instruction has been reviewed in detail and patient stated an understanding of material given. Patient plans to exercise at home on his Treadmill. Cardiac Rehab staff will make f/u calls at 1 month, 6 months, and 1 year. Patient had no complaints of any abnormal S/S or pain on their exit visit.

## 2018-03-06 ENCOUNTER — Ambulatory Visit (INDEPENDENT_AMBULATORY_CARE_PROVIDER_SITE_OTHER): Payer: Medicare Other | Admitting: Cardiology

## 2018-03-06 ENCOUNTER — Ambulatory Visit (INDEPENDENT_AMBULATORY_CARE_PROVIDER_SITE_OTHER): Payer: Medicare Other

## 2018-03-06 ENCOUNTER — Encounter: Payer: Self-pay | Admitting: Cardiology

## 2018-03-06 VITALS — BP 109/73 | HR 75 | Ht 72.0 in | Wt 193.0 lb

## 2018-03-06 DIAGNOSIS — I4891 Unspecified atrial fibrillation: Secondary | ICD-10-CM

## 2018-03-06 DIAGNOSIS — I482 Chronic atrial fibrillation, unspecified: Secondary | ICD-10-CM | POA: Diagnosis not present

## 2018-03-06 DIAGNOSIS — I255 Ischemic cardiomyopathy: Secondary | ICD-10-CM | POA: Diagnosis not present

## 2018-03-06 MED ORDER — CARVEDILOL 6.25 MG PO TABS: 6.2500 mg | ORAL_TABLET | Freq: Two times a day (BID) | ORAL | 3 refills | Status: DC

## 2018-03-06 MED ORDER — ROSUVASTATIN CALCIUM 40 MG PO TABS: 40.0000 mg | ORAL_TABLET | Freq: Every day | ORAL | 3 refills | Status: DC

## 2018-03-06 MED ORDER — APIXABAN 5 MG PO TABS: 5.0000 mg | ORAL_TABLET | Freq: Two times a day (BID) | ORAL | 3 refills | Status: DC

## 2018-03-06 MED ORDER — CLOPIDOGREL BISULFATE 75 MG PO TABS: 75.0000 mg | ORAL_TABLET | Freq: Every day | ORAL | 2 refills | Status: DC

## 2018-03-06 NOTE — Patient Instructions (Signed)
Medication Instructions:  Your physician recommends that you continue on your current medications as directed. Please refer to the Current Medication list given to you today.   Labwork: I will request labs from pcp  Testing/Procedures: Your physician has recommended that you wear an event monitor. Event monitors are medical devices that record the heart's electrical activity. Doctors most often [place] these monitors to diagnose arrhythmias. Arrhythmias are problems with the speed or rhythm of the heartbeat. The monitor is a small, portable device. You can wear one while you do your normal daily activities. This is usually used to diagnose what is causing palpitations/syncope (passing out).  (21 days)  Follow-Up: Your physician wants you to follow-up in: MAY 2020.  You will receive a reminder letter in the mail two months in advance. If you don't receive a letter, please call our office to schedule the follow-up appointment.   Any Other Special Instructions Will Be Listed Below (If Applicable).     If you need a refill on your cardiac medications before your next appointment, please call your pharmacy.

## 2018-03-06 NOTE — Progress Notes (Signed)
Clinical Summary Mr. Afonso is a 72 y.o.male seen today for follow up of the following medical problems.    1. CAD/ICM/Chronic systolic HF - admit 07/2839 with STEMI. Received DES to LAD, PTCA of diag - 09/2017 echo LVEF 30-35%. He was discharged with lifevest for primary prevention - repeat 10/2017 35-40%, lifevest discontinued - given afib was started on plavix, asa, and anticoag x 1 month, then plavix and anticoag.   - medical therapy has been limited due to soft bp's and renal dysfunction.  - completed cardiac rehab 1 week ago. Tolerated well without symptmos - continuing to go to [facility], walks on treadmill up to 40 minutes without troubles   2. Afib - new diagnosis during his admission with acute MI. Unclear if isolated in setting of MI - started on amio, has been on 200mg  daily  - no recent palpitations.   3. Hyperlipidemia - crestor increased to 40mg  daily during recent admission - compliant with statin   4. CKD III - limiting nephrotoxic meds     Past Medical History:  Diagnosis Date  . Acute systolic heart failure ([place])   . COPD (chronic obstructive pulmonary disease) ([place])   . Hyperlipidemia   . Hypertension   . Low serum testosterone level   . PAF (paroxysmal atrial fibrillation) ([place])   . Pneumonia 05/2014  . Shortness of breath dyspnea   . STEMI (ST elevation myocardial infarction) ([place])    09/27/17 PCI/DES x1 mLAD, PTCA of the diag Kamen Hanken. EF 30% Lifevest at discharge.   . Urinary frequency      No Known Allergies   Current Outpatient Medications  Medication Sig Dispense Refill  . ALPRAZolam (XANAX) 0.25 MG tablet Take 0.25 mg by mouth at bedtime as needed for sleep.  5  . apixaban (ELIQUIS) 5 MG TABS tablet Take 1 tablet (5 mg total) by mouth 2 (two) times daily. 180 tablet 3  . Calcium Citrate-Vitamin D (CALCIUM CITRATE + D3 PO) Take 1 tablet by mouth daily.    . carvedilol (COREG) 6.25 MG tablet Take 1 tablet (6.25 mg total) by mouth 2  (two) times daily with a meal. 180 tablet 3  . clopidogrel (PLAVIX) 75 MG tablet Take 1 tablet (75 mg total) by mouth daily. 90 tablet 2  . furosemide (LASIX) 40 MG tablet Take 40 mg by mouth daily as needed for fluid or edema.    [name] Oil 350 MG CAPS Take 350 mg by mouth daily.    . Lactobacillus (ACIDOPHILUS PROBIOTIC) 10 MG TABS Take 10 mg by mouth daily.     Marland Kitchen losartan (COZAAR) 25 MG tablet Take 1 tablet (25 mg total) by mouth daily. 90 tablet 3  . Multiple Vitamins-Minerals (SENIOR VITES PO) Take 1 tablet by mouth daily.    . nitroGLYCERIN (NITROSTAT) 0.4 MG SL tablet Place 1 tablet (0.4 mg total) under the tongue every 5 (five) minutes as needed. 25 tablet 2  . NON FORMULARY Apply 1 application topically daily. Fluc2% Toe Nail Drops for Fungus    . rosuvastatin (CRESTOR) 40 MG tablet Take 1 tablet (40 mg total) by mouth daily. 90 tablet 1  . tamsulosin (FLOMAX) 0.4 MG CAPS capsule Take 0.4 mg by mouth daily.     Marland Kitchen tiotropium (SPIRIVA) 18 MCG inhalation capsule Place 1 capsule (18 mcg total) into inhaler and inhale daily. 30 capsule 1   No current facility-administered medications for this visit.      Past Surgical History:  Procedure  Laterality Date  . APPENDECTOMY    . BACK SURGERY    . COLONOSCOPY  2011   Tubular adenoma per patient  . COLONOSCOPY N/A 03/03/2015   Procedure: COLONOSCOPY;  Surgeon: Danie Binder, MD;  [facility];  Service: Endoscopy;  Laterality: N/A;  1245 - moved to 1:00 - office to notify pt   . CORONARY/GRAFT ACUTE MI REVASCULARIZATION N/A 09/27/2017   Procedure: Coronary/Graft Acute MI Revascularization;  Surgeon: Burnell Blanks, MD;  Location: [place] [facility];  Service: Cardiovascular;  Laterality: N/A;  . EMPYEMA DRAINAGE Right 06/27/2014   Procedure: EMPYEMA DRAINAGE;  Surgeon: Grace Isaac, MD;  Location: [place];  Service: Thoracic;  Laterality: Right;  . HEMORRHOID BANDING N/A 03/03/2015   Procedure: HEMORRHOID BANDING;   Surgeon: Danie Binder, MD;  [facility];  Service: Endoscopy;  Laterality: N/A;  Recomended to see a Psychologist, sport and exercise.  [name]    . INTRAVASCULAR ULTRASOUND/IVUS N/A 09/27/2017   Procedure: Intravascular Ultrasound/IVUS;  Surgeon: Burnell Blanks, MD;  Location: [place] [facility];  Service: Cardiovascular;  Laterality: N/A;  . LEFT HEART CATH AND CORONARY ANGIOGRAPHY N/A 09/27/2017   Procedure: LEFT HEART CATH AND CORONARY ANGIOGRAPHY;  Surgeon: Burnell Blanks, MD;  Location: [place] [facility];  Service: Cardiovascular;  Laterality: N/A;  . TEE WITHOUT CARDIOVERSION N/A 07/14/2014   Procedure: TRANSESOPHAGEAL ECHOCARDIOGRAM (TEE);  Surgeon: Sueanne Margarita, MD;  Location: [facility] ENDOSCOPY;  Service: Cardiovascular;  Laterality: N/A;  . VARICOSE VEIN SURGERY    . VIDEO ASSISTED THORACOSCOPY Right 06/27/2014   Procedure: VIDEO ASSISTED THORACOSCOPY;  Surgeon: Grace Isaac, MD;  Location: [place];  Service: Thoracic;  Laterality: Right;  Marland Kitchen VIDEO BRONCHOSCOPY N/A 06/27/2014   Procedure: VIDEO BRONCHOSCOPY;  Surgeon: Grace Isaac, MD;  Location: [facility] OR;  Service: Thoracic;  Laterality: N/A;     No Known Allergies    Family History  Problem Relation Age of Onset  . Tuberculosis Father   . Hypertension Father   . COPD Mother   . Hypertension Mother   . Colon cancer Neg Hx      Social History Mr. Terrance reports that he quit smoking about 3 years ago. His smoking use included pipe. He has never used smokeless tobacco. Mr. Dirk reports current alcohol use.   Review of Systems CONSTITUTIONAL: No weight loss, fever, chills, weakness or fatigue.  HEENT: Eyes: No visual loss, blurred vision, double vision or yellow sclerae.No hearing loss, sneezing, congestion, runny nose or sore throat.  SKIN: No rash or itching.  CARDIOVASCULAR: per hpi RESPIRATORY: No shortness of breath, cough or sputum.  GASTROINTESTINAL: No anorexia, nausea, vomiting or diarrhea. No  abdominal pain or blood.  GENITOURINARY: No burning on urination, no polyuria NEUROLOGICAL: No headache, dizziness, syncope, paralysis, ataxia, numbness or tingling in the extremities. No change in bowel or bladder control.  MUSCULOSKELETAL: No muscle, back pain, joint pain or stiffness.  LYMPHATICS: No enlarged nodes. No history of splenectomy.  PSYCHIATRIC: No history of depression or anxiety.  ENDOCRINOLOGIC: No reports of sweating, cold or heat intolerance. No polyuria or polydipsia.  Marland Kitchen   Physical Examination Vitals:   03/06/18 1332  BP: 109/73  Pulse: 75  SpO2: 98%   Vitals:   03/06/18 1332  Weight: 193 lb (87.5 kg)  Height: 6' (1.829 m)    Gen: resting comfortably, no acute distress HEENT: no scleral icterus, pupils equal round and reactive, no palptable cervical adenopathy,  CV: RRR,  no m/r/g, no jvd Resp: Clear to auscultation bilaterally GI: abdomen is soft, non-tender, non-distended, normal bowel sounds, no hepatosplenomegaly MSK: extremities are warm, no edema.  Skin: warm, no rash Neuro:  no focal deficits Psych: appropriate affect   Diagnostic Studies 09/2017 cath  Prox RCA lesion is 30% stenosed.  Prox LAD lesion is 100% stenosed.  Prox Cx lesion is 20% stenosed.  A drug-eluting stent was successfully placed using a STENT SYNERGY DES 3X16.  Post intervention, there is a 0% residual stenosis.  Ost 1st Diag lesion is 100% stenosed.  Balloon angioplasty was performed using a BALLOON SAPPHIRE 2.5X12.  Post intervention, there is a 40% residual stenosis.  Prox LAD to Mid LAD lesion is 40% stenosed.  1. Acute anterior STEMI secondary to occlusion of the mid LAD 2. Successful PTCA/DES x 1 mid LAD 3. Successful PTCA/angioplasty ostium of the Diagonal Eyden Dobie.  4. Mild non-obstructive disease in the RCA, circumflex and mid LAD  Post cath Recommendations:  Will admit to the ICU and continue Aggrastat for 6 hours. Will start high intensity statin and a  beta blocker. Echo later today.  . Recommend uninterrupted dual antiplatelet therapy with Aspirin 81mg  daily and Ticagrelor 90mg  twice dailyfor a minimum of 12 months (ACS - Class I recommendation).   09/2017 echo Study Conclusions  - Left ventricle: The cavity size was normal. There was mild focal basal hypertrophy of the septum. Systolic function was moderately to severely reduced. The estimated ejection fraction was in the range of 30% to 35%. Severe hypokinesis of the mid to apical anteroseptal, anterior, and anterolateral myocardium. Dyskinetic at apex. Doppler parameters are consistent with abnormal left ventricular relaxation (grade 1 diastolic dysfunction). - Aortic valve: Trileaflet; mildly thickened, mildly calcified leaflets. - Mitral valve: Calcified annulus. There was trivial regurgitation. - Pulmonary arteries: PA peak pressure: 41 mm Hg (S). - Pericardium, extracardiac: A small pericardial effusion was identified.  Impressions:  - New reduction in LV EF with regional wall motion abnormalities in the mid to apical anteroseptum, anterior, and anterolateral walls with dyskinesis at the apex. Small pericardial effusion noted.  10/2017 echo Study Conclusions  - Left ventricle: The cavity size was normal. Wall thickness was increased in a pattern of moderate LVH. Systolic function was moderately reduced. The estimated ejection fraction was in the range of 35% to 40%. Doppler parameters are consistent with abnormal left ventricular relaxation (grade 1 diastolic dysfunction). Doppler parameters are consistent with high ventricular filling pressure. - Regional wall motion abnormality: Hypokinesis of the apical septal, apical lateral, and apical myocardium. - Aortic valve: Mildly calcified annulus. Trileaflet; mildly thickened leaflets. Valve area (VTI): 4.91 cm^2. Valve area (Vmax): 4.27 cm^2. Valve area (Vmean): 3.54  cm^2. - Mitral valve: Mildly calcified annulus. Mildly thickened leaflets . - Left atrium: The atrium was moderately dilated. - Pericardium, extracardiac: There is a small circumferential pericardial effusion. The effusion measures 0.8 cm adjacent to the LV. There is no evidence of tamponade physiology. - Technically adequate study.     Assessment and Plan   1. CAD/ICM/chronic systolic HF - medical therapy limited by soft bp's and renal dysfunction - continue current meds, doing well without symptoms.  - likely repeat echo at next f/u  2. Afib - new diagnosis in setting of MI, unclear if isolated event or prior undiagnosed episodes - no clear recurrence since coming off amio. We will obtain 3 week event monitor, if no afib come off anticoag, would need to restart his ASA.  - EKG today  shows NSR  3. Hyperlipidemia - he will continues tatin  4. CKD 3 - continue to limit nephrotoxic meds, follow labs      Arnoldo Lenis, M.D.

## 2018-03-26 DIAGNOSIS — I4891 Unspecified atrial fibrillation: Secondary | ICD-10-CM

## 2018-03-30 DIAGNOSIS — H52223 Regular astigmatism, bilateral: Secondary | ICD-10-CM | POA: Diagnosis not present

## 2018-03-30 DIAGNOSIS — H25813 Combined forms of age-related cataract, bilateral: Secondary | ICD-10-CM | POA: Diagnosis not present

## 2018-03-30 DIAGNOSIS — H43813 Vitreous degeneration, bilateral: Secondary | ICD-10-CM | POA: Diagnosis not present

## 2018-03-30 DIAGNOSIS — H524 Presbyopia: Secondary | ICD-10-CM | POA: Diagnosis not present

## 2018-03-30 DIAGNOSIS — H5201 Hypermetropia, right eye: Secondary | ICD-10-CM | POA: Diagnosis not present

## 2018-04-06 ENCOUNTER — Telehealth: Payer: Self-pay | Admitting: *Deleted

## 2018-04-06 MED ORDER — ASPIRIN EC 81 MG PO TBEC: 81.0000 mg | DELAYED_RELEASE_TABLET | Freq: Every day | ORAL | 3 refills | Status: AC

## 2018-04-06 NOTE — Telephone Encounter (Signed)
Pt notified. Eliquis discontinued, ASA started.

## 2018-04-06 NOTE — Telephone Encounter (Signed)
-----   Message from Arnoldo Lenis, MD sent at 04/06/2018 12:27 PM EST ----- Heart monitor looks good without any more afib, can come off eliquis but needs to start aspirin 81mg  daily   Zandra Abts MD

## 2018-04-24 ENCOUNTER — Telehealth: Payer: Self-pay | Admitting: Cardiology

## 2018-04-24 NOTE — Telephone Encounter (Signed)
Wife made aware

## 2018-04-24 NOTE — Telephone Encounter (Signed)
Had echo 10/2017 will ask Dr Harl Bowie

## 2018-04-24 NOTE — Telephone Encounter (Signed)
Pt would like to know if he needs an Echo prior to his apt? He's scheduled for Friday May 15. States he and his wife are going to be out of town for a while during the summer and want to make sure everything is done.

## 2018-04-24 NOTE — Telephone Encounter (Signed)
No repeat echo needed before appt  Zandra Abts MD

## 2018-06-07 DIAGNOSIS — N401 Enlarged prostate with lower urinary tract symptoms: Secondary | ICD-10-CM | POA: Diagnosis not present

## 2018-06-07 DIAGNOSIS — I10 Essential (primary) hypertension: Secondary | ICD-10-CM | POA: Diagnosis not present

## 2018-06-07 DIAGNOSIS — J449 Chronic obstructive pulmonary disease, unspecified: Secondary | ICD-10-CM | POA: Diagnosis not present

## 2018-06-07 DIAGNOSIS — I2510 Atherosclerotic heart disease of native coronary artery without angina pectoris: Secondary | ICD-10-CM | POA: Diagnosis not present

## 2018-07-26 DIAGNOSIS — I2510 Atherosclerotic heart disease of native coronary artery without angina pectoris: Secondary | ICD-10-CM | POA: Diagnosis not present

## 2018-07-26 DIAGNOSIS — J441 Chronic obstructive pulmonary disease with (acute) exacerbation: Secondary | ICD-10-CM | POA: Diagnosis not present

## 2018-07-26 DIAGNOSIS — N401 Enlarged prostate with lower urinary tract symptoms: Secondary | ICD-10-CM | POA: Diagnosis not present

## 2018-07-27 ENCOUNTER — Telehealth: Payer: Self-pay | Admitting: Cardiology

## 2018-07-27 NOTE — Telephone Encounter (Signed)
Patient called stating that he does not want a Virtual appointment. That he received notification from my-chart that his appointment had been changed.    He would like to have an Echo done prior to seeing Dr Harl Bowie in office. If patient still would like to cancel appointment after finding out about echo, please do so.

## 2018-07-30 ENCOUNTER — Other Ambulatory Visit: Payer: Self-pay

## 2018-07-30 ENCOUNTER — Emergency Department (HOSPITAL_COMMUNITY)
Admission: EM | Admit: 2018-07-30 | Discharge: 2018-07-30 | Disposition: A | Discharge status: Home / Self Care | Payer: Medicare Other | Attending: Emergency Medicine | Admitting: Emergency Medicine

## 2018-07-30 ENCOUNTER — Encounter (HOSPITAL_COMMUNITY): Payer: Self-pay

## 2018-07-30 ENCOUNTER — Emergency Department (HOSPITAL_COMMUNITY): Payer: Medicare Other

## 2018-07-30 DIAGNOSIS — I110 Hypertensive heart disease with heart failure: Secondary | ICD-10-CM | POA: Insufficient documentation

## 2018-07-30 DIAGNOSIS — Z79899 Other long term (current) drug therapy: Secondary | ICD-10-CM | POA: Diagnosis not present

## 2018-07-30 DIAGNOSIS — F17210 Nicotine dependence, cigarettes, uncomplicated: Secondary | ICD-10-CM | POA: Diagnosis not present

## 2018-07-30 DIAGNOSIS — R05 Cough: Secondary | ICD-10-CM | POA: Diagnosis not present

## 2018-07-30 DIAGNOSIS — J181 Lobar pneumonia, unspecified organism: Secondary | ICD-10-CM | POA: Diagnosis not present

## 2018-07-30 DIAGNOSIS — Z03818 Encounter for observation for suspected exposure to other biological agents ruled out: Secondary | ICD-10-CM | POA: Diagnosis not present

## 2018-07-30 DIAGNOSIS — J189 Pneumonia, unspecified organism: Secondary | ICD-10-CM

## 2018-07-30 DIAGNOSIS — Z20828 Contact with and (suspected) exposure to other viral communicable diseases: Secondary | ICD-10-CM | POA: Insufficient documentation

## 2018-07-30 DIAGNOSIS — I2510 Atherosclerotic heart disease of native coronary artery without angina pectoris: Secondary | ICD-10-CM | POA: Insufficient documentation

## 2018-07-30 DIAGNOSIS — R509 Fever, unspecified: Secondary | ICD-10-CM | POA: Diagnosis present

## 2018-07-30 DIAGNOSIS — I4891 Unspecified atrial fibrillation: Secondary | ICD-10-CM | POA: Diagnosis not present

## 2018-07-30 DIAGNOSIS — I5020 Unspecified systolic (congestive) heart failure: Secondary | ICD-10-CM | POA: Diagnosis not present

## 2018-07-30 DIAGNOSIS — J449 Chronic obstructive pulmonary disease, unspecified: Secondary | ICD-10-CM | POA: Diagnosis not present

## 2018-07-30 DIAGNOSIS — J168 Pneumonia due to other specified infectious organisms: Secondary | ICD-10-CM | POA: Diagnosis not present

## 2018-07-30 LAB — COMPREHENSIVE METABOLIC PANEL
ALT: 28 U/L (ref 0–44)
AST: 20 U/L (ref 15–41)
Albumin: 3.5 g/dL (ref 3.5–5.0)
Alkaline Phosphatase: 50 U/L (ref 38–126)
Anion gap: 10 (ref 5–15)
BUN: 20 mg/dL (ref 8–23)
CO2: 27 mmol/L (ref 22–32)
Calcium: 8.9 mg/dL (ref 8.9–10.3)
Chloride: 101 mmol/L (ref 98–111)
Creatinine, Ser: 1.16 mg/dL (ref 0.61–1.24)
GFR calc Af Amer: >60 mL/min (ref >60)
GFR calc non Af Amer: >60 mL/min (ref >60)
Glucose, Bld: 102 mg/dL — ABNORMAL HIGH (ref 70–99)
Potassium: 4.7 mmol/L (ref 3.5–5.1)
Sodium: 138 mmol/L (ref 135–145)
Total Bilirubin: 0.7 mg/dL (ref 0.3–1.2)
Total Protein: 7.4 g/dL (ref 6.5–8.1)

## 2018-07-30 LAB — CBC WITH DIFFERENTIAL/PLATELET
Abs Immature Granulocytes: 0.02 10*3/uL (ref 0.00–0.07)
Basophils Absolute: 0.1 10*3/uL (ref 0.0–0.1)
Basophils Relative: 1 %
Eosinophils Absolute: 0.3 10*3/uL (ref 0.0–0.5)
Eosinophils Relative: 3 %
HCT: 43.3 % (ref 39.0–52.0)
Hemoglobin: 14 g/dL (ref 13.0–17.0)
Immature Granulocytes: 0 %
Lymphocytes Relative: 13 %
Lymphs Abs: 1.5 10*3/uL (ref 0.7–4.0)
MCH: 31.1 pg (ref 26.0–34.0)
MCHC: 32.3 g/dL (ref 30.0–36.0)
MCV: 96.2 fL (ref 80.0–100.0)
Monocytes Absolute: 1.4 10*3/uL — ABNORMAL HIGH (ref 0.1–1.0)
Monocytes Relative: 13 %
Neutro Abs: 8 10*3/uL — ABNORMAL HIGH (ref 1.7–7.7)
Neutrophils Relative %: 70 %
Platelets: 253 10*3/uL (ref 150–400)
RBC: 4.5 MIL/uL (ref 4.22–5.81)
RDW: 13 % (ref 11.5–15.5)
WBC: 11.4 10*3/uL — ABNORMAL HIGH (ref 4.0–10.5)
nRBC: 0 % (ref 0.0–0.2)

## 2018-07-30 LAB — BASIC METABOLIC PANEL
Anion gap: 11 (ref 5–15)
BUN: 21 mg/dL (ref 8–23)
CO2: 26 mmol/L (ref 22–32)
Calcium: 9.1 mg/dL (ref 8.9–10.3)
Chloride: 100 mmol/L (ref 98–111)
Creatinine, Ser: 1.19 mg/dL (ref 0.61–1.24)
GFR calc Af Amer: >60 mL/min (ref >60)
GFR calc non Af Amer: >60 mL/min (ref >60)
Glucose, Bld: 95 mg/dL (ref 70–99)
Potassium: 4.6 mmol/L (ref 3.5–5.1)
Sodium: 137 mmol/L (ref 135–145)

## 2018-07-30 LAB — SARS CORONAVIRUS 2 BY RT PCR (HOSPITAL ORDER, PERFORMED IN ~~LOC~~ HOSPITAL LAB): SARS Coronavirus 2: NEGATIVE

## 2018-07-30 LAB — LACTIC ACID, PLASMA: Lactic Acid, Venous: 0.7 mmol/L (ref 0.5–1.9)

## 2018-07-30 MED ORDER — DOXYCYCLINE HYCLATE 100 MG PO TABS
100.0000 mg | ORAL_TABLET | Freq: Once | ORAL | Status: AC
  Administered 2018-07-30: 100 mg via ORAL
  Filled 2018-07-30: qty 1

## 2018-07-30 MED ORDER — AMOXICILLIN-POT CLAVULANATE 875-125 MG PO TABS
1.0000 | ORAL_TABLET | Freq: Once | ORAL | Status: AC
  Administered 2018-07-30: 15:00:00 1 via ORAL
  Filled 2018-07-30: qty 1

## 2018-07-30 MED ORDER — DOXYCYCLINE HYCLATE 100 MG PO CAPS: 100.0000 mg | ORAL_CAPSULE | Freq: Two times a day (BID) | ORAL | 0 refills | Status: DC

## 2018-07-30 MED ORDER — AMOXICILLIN-POT CLAVULANATE 875-125 MG PO TABS: 1.0000 | ORAL_TABLET | Freq: Two times a day (BID) | ORAL | 0 refills | Status: DC

## 2018-07-30 NOTE — ED Triage Notes (Signed)
Pt presents to ED with complaints of fever since Tuesday night. Pt seen at Dr [name] office and was prescribed a Zpack and finished last dose today. Pt states the highest temp at home 101. Pt states he has a chronic cough that was worse but "back to normal" now.

## 2018-07-30 NOTE — Telephone Encounter (Signed)
Spoke with pt who states he does not want to cancel appt at this time. Pt questioning if test may be done from a virtual visit. Pt informed that all necessary test will be done. Pt voiced understanding and stated he would prefer video visit done with e-mail address [email or web address].

## 2018-07-30 NOTE — ED Notes (Signed)
Called in room, EDP in room and stated that syringe on end of IV was left, syringe had been removed by EDP(per pt).  Cap placed on end of saline lock and flushed again, site wnl.

## 2018-07-30 NOTE — Discharge Instructions (Signed)
I have spoken with Dr. [name].  After discussion of the treatment options we have decided that doxycycline twice a day for the next 10 days as well as Augmentin twice a day for the next 7 days would be the best option for antibiotics.  Your x-ray does show a pneumonia on the bottom left side of the lung.  Your blood work has looked normal and you do not have coronavirus based on the test that we performed today.  Please take these medications exactly as prescribed.  Your next dose will be tomorrow morning.  Seek medical exam for increasing difficulty breathing chest pain or fever.

## 2018-07-30 NOTE — ED Notes (Signed)
Pt states Dr. [name] sent pt here for cxr and blood work along with Covid 19 test

## 2018-07-30 NOTE — ED Provider Notes (Addendum)
[facility] EMERGENCY DEPARTMENT Provider Note   CSN: [id number] Arrival date & time: 07/30/18  1149    History   Chief Complaint Chief Complaint  Patient presents with  . Fever    HPI [name] [name] is a 73 y.o. male.     HPI  The patient is a 73 year old male, history of multiple medical problems including paroxysmal atrial fibrillation, COPD, he is a longtime smoker, he has a history of systolic heart failure, coronary disease status post stenting approximately 10 months ago.  He is also known to have an empyema which occurred in 2016 and required surgical drainage.  He had visited with his family doctor, Dr. at [place] who is also his pulmonologist approximately a week and a half ago by telemedicine and at that time he was prescribed Zithromax to help with some of the cough and fever that he was having.  The cough has seemed to improve and he is not really being bothered by that but continues to have intermittent fevers which occur mostly at night.  He is taking Tylenol during the day.  He was referred back to the hospital today because of ongoing fevers for further evaluation.  The patient is not having any nausea vomiting or diarrhea, he has no rashes sore throat minimal cough, no swelling, no dysuria, no rectal bleeding.  The patient denies any sick contacts, denies any travel, he does endorse having multiple tick bites within the last 2 to 3 weeks, all of the tics have been removed, he does have some mild myalgias in his shoulder but no other myalgias.  No rashes to the skin  Past Medical History:  Diagnosis Date  . Acute systolic heart failure ([place])   . COPD (chronic obstructive pulmonary disease) ([place])   . Hyperlipidemia   . Hypertension   . Low serum testosterone level   . PAF (paroxysmal atrial fibrillation) ([place])   . Pneumonia 05/2014  . Shortness of breath dyspnea   . STEMI (ST elevation myocardial infarction) ([place])    09/27/17 PCI/DES x1 mLAD, PTCA of the diag branch.  EF 30% Lifevest at discharge.   . Urinary frequency     Patient Active Problem List   Diagnosis Date Noted  . Status post coronary artery stent placement   . Acute systolic heart failure ([place])   . PAF (paroxysmal atrial fibrillation) ([place])   . Shortness of breath   . Acute pulmonary edema (HCC)   . ST elevation myocardial infarction (STEMI) ([place])   . S/P lumbar spinal fusion 08/02/2017  . Hemorrhoids 02/05/2015  . History of adenomatous polyp of colon 02/05/2015  . Acute and subacute infective endocarditis in diseases classified elsewhere 07/22/2014  . Empyema ([place])   . Pyrexia   . Fever 07/09/2014  . Empyema of right pleural space ([place]) 06/26/2014  . Empyema lung ([place]) 06/26/2014  . SIRS (systemic inflammatory response syndrome) ([place]) 06/10/2014  . CAP (community acquired pneumonia) 06/07/2014  . Acute respiratory failure with hypoxia ([place]) 06/07/2014  . Acute renal injury ([place]) 06/07/2014  . Dehydration 06/07/2014  . Hypertension 06/07/2014  . BPH (benign prostatic hypertrophy) 06/07/2014  . COPD (chronic obstructive pulmonary disease) ([place]) 06/07/2014    Past Surgical History:  Procedure Laterality Date  . APPENDECTOMY    . BACK SURGERY    . COLONOSCOPY  2011   Tubular adenoma per patient  . COLONOSCOPY N/A 03/03/2015   Procedure: COLONOSCOPY;  Surgeon: Danie Binder, MD;  [facility];  Service: Endoscopy;  Laterality: N/A;  1245 - moved to 1:00 - office to notify pt   . CORONARY/GRAFT ACUTE MI REVASCULARIZATION N/A 09/27/2017   Procedure: Coronary/Graft Acute MI Revascularization;  Surgeon: Burnell Blanks, MD;  Location: [place] CV LAB;  Service: Cardiovascular;  Laterality: N/A;  . EMPYEMA DRAINAGE Right 06/27/2014   Procedure: EMPYEMA DRAINAGE;  Surgeon: Grace Isaac, MD;  Location: [place];  Service: Thoracic;  Laterality: Right;  . HEMORRHOID BANDING N/A 03/03/2015   Procedure: HEMORRHOID BANDING;  Surgeon: Danie Binder, MD;  [facility]  [facility];  Service: Endoscopy;  Laterality: N/A;  Recomended to see a Psychologist, sport and exercise.  [name]    . INTRAVASCULAR ULTRASOUND/IVUS N/A 09/27/2017   Procedure: Intravascular Ultrasound/IVUS;  Surgeon: Burnell Blanks, MD;  Location: [place] CV LAB;  Service: Cardiovascular;  Laterality: N/A;  . LEFT HEART CATH AND CORONARY ANGIOGRAPHY N/A 09/27/2017   Procedure: LEFT HEART CATH AND CORONARY ANGIOGRAPHY;  Surgeon: Burnell Blanks, MD;  Location: [place] CV LAB;  Service: Cardiovascular;  Laterality: N/A;  . TEE WITHOUT CARDIOVERSION N/A 07/14/2014   Procedure: TRANSESOPHAGEAL ECHOCARDIOGRAM (TEE);  Surgeon: Sueanne Margarita, MD;  Location[facility] ENDOSCOPY;  Service: Cardiovascular;  Laterality: N/A;  . VARICOSE VEIN SURGERY    . VIDEO ASSISTED THORACOSCOPY Right 06/27/2014   Procedure: VIDEO ASSISTED THORACOSCOPY;  Surgeon: Grace Isaac, MD;  Location: [place];  Service: Thoracic;  Laterality: Right;  Marland Kitchen VIDEO BRONCHOSCOPY N/A 06/27/2014   Procedure: VIDEO BRONCHOSCOPY;  Surgeon: Grace Isaac, MD;  Location: [facility] OR;  Service: Thoracic;  Laterality: N/A;        Home Medications    Prior to Admission medications   Medication Sig Start Date End Date Taking? Authorizing Provider  ALPRAZolam Duanne Moron) 0.25 MG tablet Take 0.25 mg by mouth at bedtime as needed for sleep. 07/11/17  Yes [provider]  aspirin EC 81 MG tablet Take 1 tablet (81 mg total) by mouth daily. 04/06/18  Yes BranchAlphonse Guild, MD  Calcium Citrate-Vitamin D (CALCIUM CITRATE + D3 PO) Take 1 tablet by mouth daily.   Yes [provider]  carvedilol (COREG) 6.25 MG tablet Take 1 tablet (6.25 mg total) by mouth 2 (two) times daily with a meal. 03/06/18  Yes Branch, Alphonse Guild, MD  clopidogrel (PLAVIX) 75 MG tablet Take 1 tablet (75 mg total) by mouth daily. 03/06/18  Yes BranchAlphonse Guild, MD  Krill Oil 350 MG CAPS Take 350 mg by mouth daily.   Yes [provider]  Lactobacillus  (ACIDOPHILUS PROBIOTIC) 10 MG TABS Take 10 mg by mouth daily.    Yes [provider]  losartan (COZAAR) 25 MG tablet Take 1 tablet (25 mg total) by mouth daily. 12/06/17  Yes BranchAlphonse Guild, MD  Multiple Vitamins-Minerals (SENIOR VITES PO) Take 1 tablet by mouth daily.   Yes [provider]  nitroGLYCERIN (NITROSTAT) 0.4 MG SL tablet Place 1 tablet (0.4 mg total) under the tongue every 5 (five) minutes as needed. 10/04/17  Yes Reino Bellis B, NP  rosuvastatin (CRESTOR) 40 MG tablet Take 1 tablet (40 mg total) by mouth daily. 03/06/18  Yes BranchAlphonse Guild, MD  tamsulosin (FLOMAX) 0.4 MG CAPS capsule Take 0.4 mg by mouth daily.  05/30/14  Yes [provider]  tiotropium (SPIRIVA) 18 MCG inhalation capsule Place 1 capsule (18 mcg total) into inhaler and inhale daily. 10/05/17  Yes Reino Bellis B, NP  amoxicillin-clavulanate (AUGMENTIN) 875-125 MG tablet Take 1 tablet  by mouth every 12 (twelve) hours. 07/30/18   Noemi Chapel, MD  azithromycin (ZITHROMAX) 250 MG tablet Take 1 tablet by mouth daily. 07/26/18   [provider]  doxycycline (VIBRAMYCIN) 100 MG capsule Take 1 capsule (100 mg total) by mouth 2 (two) times daily. 07/30/18   Noemi Chapel, MD    Family History Family History  Problem Relation Age of Onset  . Tuberculosis Father   . Hypertension Father   . COPD Mother   . Hypertension Mother   . Colon cancer Neg Hx     Social History Social History   Tobacco Use  . Smoking status: Former Smoker    Types: Pipe    Last attempt to quit: 06/07/2014    Years since quitting: 4.1  . Smokeless tobacco: Never Used  Substance Use Topics  . Alcohol use: Yes    Alcohol/week: 0.0 standard drinks    Comment: Average 1-2 drinks of beer in the evening  . Drug use: No     Allergies   Patient has no known allergies.   Review of Systems Review of Systems  All other systems reviewed and are negative.    Physical Exam Updated Vital Signs BP  135/68   Pulse 83   Temp 97.9 F (36.6 C) (Oral)   Resp 18   Ht 1.829 m (6')   Wt 86.6 kg   SpO2 99%   BMI 25.90 kg/m   Physical Exam Vitals signs and nursing note reviewed.  Constitutional:      General: He is not in acute distress.    Appearance: He is well-developed.  HENT:     Head: Normocephalic and atraumatic.     Mouth/Throat:     Pharynx: No oropharyngeal exudate.  Eyes:     General: No scleral icterus.       Right eye: No discharge.        Left eye: No discharge.     Conjunctiva/sclera: Conjunctivae normal.     Pupils: Pupils are equal, round, and reactive to light.  Neck:     Musculoskeletal: Normal range of motion and neck supple.     Thyroid: No thyromegaly.     Vascular: No JVD.  Cardiovascular:     Rate and Rhythm: Normal rate and regular rhythm.     Heart sounds: Normal heart sounds. No murmur. No friction rub. No gallop.   Pulmonary:     Effort: Pulmonary effort is normal. No respiratory distress.     Breath sounds: Normal breath sounds. No wheezing or rales.  Abdominal:     General: Bowel sounds are normal. There is no distension.     Palpations: Abdomen is soft. There is no mass.     Tenderness: There is no abdominal tenderness.  Musculoskeletal: Normal range of motion.        General: No tenderness.  Lymphadenopathy:     Cervical: No cervical adenopathy.  Skin:    General: Skin is warm and dry.     Findings: No erythema or rash.  Neurological:     Mental Status: He is alert.     Coordination: Coordination normal.  Psychiatric:        Behavior: Behavior normal.      ED Treatments / Results  Labs (all labs ordered are listed, but only abnormal results are displayed) Labs Reviewed  CBC WITH DIFFERENTIAL/PLATELET - Abnormal; Notable for the following components:      Result Value   WBC 11.4 (*)    Neutro  Abs 8.0 (*)    Monocytes Absolute 1.4 (*)    All other components within normal limits  COMPREHENSIVE METABOLIC PANEL - Abnormal;  Notable for the following components:   Glucose, Bld 102 (*)    All other components within normal limits  SARS CORONAVIRUS 2 (HOSPITAL ORDER, [place] [facility])  BASIC METABOLIC PANEL  LACTIC ACID, PLASMA  ROCKY MTN SPOTTED FVR ABS PNL(IGG+IGM)    EKG None  Radiology Dg Chest Portable 1 View  Result Date: 07/30/2018 CLINICAL DATA:  Fever, cough EXAM: PORTABLE CHEST 1 VIEW COMPARISON:  10/02/2017 FINDINGS: Improvement in small right pleural effusion.  No left effusion. Left lower lobe density is new since the prior study. Probable pneumonia. Negative for heart failure. IMPRESSION: Left lower lobe airspace disease, probable pneumonia given the history. Follow-up recommended. Small right pleural effusion has improved from the prior study. Electronically Signed   By: Franchot Gallo M.D.   On: 07/30/2018 12:39    Procedures Procedures (including critical care time)  Medications Ordered in ED Medications  doxycycline (VIBRA-TABS) tablet 100 mg (has no administration in time range)  amoxicillin-clavulanate (AUGMENTIN) 875-125 MG per tablet 1 tablet (has no administration in time range)     Initial Impression / Assessment and Plan / ED Course  I have reviewed the triage vital signs and the nursing notes.  Pertinent labs & imaging results that were available during my care of the patient were reviewed by me and considered in my medical decision making (see chart for details).  Clinical Course as of Jul 29 1428  Mon Jul 30, 2018  1258 I have personally viewed the anterior posterior x-ray performed, there does appear to be a left lower lobe infiltrate, there is a slight right-sided effusion.  The patient does have a white blood cell count of 11,400 but no electrolyte abnormalities.  Again his temperature is 97.9 F, pulse of 71 and a blood pressure of 134/74.  He has an oxygen level of 98% on room air.  He does not appear to be septic though he does likely have a  pneumonia.   [BM]    Clinical Course User Index [BM] Noemi Chapel, MD       The patient does not have any signs of [facility] spotted fever, he does not have a fever, he does not have any specific arthralgias or myalgias, he does not have any rash on his skin however he does have persistent mild fevers.  Will obtain labs including CBC, CMP, IgG and IgM, will also need a chest x-ray due to his history of lung disease and will test for coronavirus per family doctor's recommendations.  I think this is all reasonable given this patient's lung disease chronically.  He is otherwise well-appearing, he is not needing accessory oxygen, he is not febrile or tachycardic or hypotensive.  Care discussed with Dr. Velvet Bathe, he recommends and is in agreement with antibiotic coverage for pneumonia with a combination of doxycycline and Augmentin and will follow up with the patient tomorrow.  The patient is in agreement and stable for discharge  Final Clinical Impressions(s) / ED Diagnoses   Final diagnoses:  Pneumonia of left lower lobe due to infectious organism [facility])    ED Discharge Orders         Ordered    doxycycline (VIBRAMYCIN) 100 MG capsule  2 times daily     07/30/18 1429    amoxicillin-clavulanate (AUGMENTIN) 875-125 MG tablet  Every 12 hours  07/30/18 1429           Noemi Chapel, MD 07/30/18 1431    Noemi Chapel, MD 07/30/18 1431

## 2018-07-30 NOTE — ED Notes (Signed)
ED Provider at bedside. 

## 2018-07-31 DIAGNOSIS — Z0000 Encounter for general adult medical examination without abnormal findings: Secondary | ICD-10-CM | POA: Diagnosis not present

## 2018-08-01 ENCOUNTER — Other Ambulatory Visit (HOSPITAL_COMMUNITY): Payer: Self-pay | Admitting: Pulmonary Disease

## 2018-08-01 DIAGNOSIS — J869 Pyothorax without fistula: Secondary | ICD-10-CM

## 2018-08-01 LAB — ROCKY MTN SPOTTED FVR ABS PNL(IGG+IGM)
RMSF IgG: NEGATIVE
RMSF IgM: 0.21 index (ref 0.00–0.89)

## 2018-08-02 ENCOUNTER — Ambulatory Visit (INDEPENDENT_AMBULATORY_CARE_PROVIDER_SITE_OTHER): Payer: Medicare Other | Admitting: Otolaryngology

## 2018-08-02 ENCOUNTER — Other Ambulatory Visit: Payer: Self-pay

## 2018-08-02 DIAGNOSIS — H903 Sensorineural hearing loss, bilateral: Secondary | ICD-10-CM

## 2018-08-02 DIAGNOSIS — H6121 Impacted cerumen, right ear: Secondary | ICD-10-CM

## 2018-08-03 ENCOUNTER — Telehealth (INDEPENDENT_AMBULATORY_CARE_PROVIDER_SITE_OTHER): Payer: Medicare Other | Admitting: Cardiology

## 2018-08-03 VITALS — BP 135/68 | HR 80

## 2018-08-03 DIAGNOSIS — I4891 Unspecified atrial fibrillation: Secondary | ICD-10-CM

## 2018-08-03 DIAGNOSIS — I5022 Chronic systolic (congestive) heart failure: Secondary | ICD-10-CM

## 2018-08-03 DIAGNOSIS — I2510 Atherosclerotic heart disease of native coronary artery without angina pectoris: Secondary | ICD-10-CM | POA: Diagnosis not present

## 2018-08-03 DIAGNOSIS — E782 Mixed hyperlipidemia: Secondary | ICD-10-CM

## 2018-08-03 DIAGNOSIS — B351 Tinea unguium: Secondary | ICD-10-CM | POA: Diagnosis not present

## 2018-08-03 NOTE — Progress Notes (Signed)
Virtual Visit via Video Note   This visit type was conducted due to national recommendations for restrictions regarding the COVID-19 Pandemic (e.g. social distancing) in an effort to limit this patient's exposure and mitigate transmission in our community.  Due to his co-morbid illnesses, this patient is at least at moderate risk for complications without adequate follow up.  This format is felt to be most appropriate for this patient at this time.  All issues noted in this document were discussed and addressed.  A limited physical exam was performed with this format.  Please refer to the patient's chart for his consent to telehealth for [facility].   Date:  08/03/2018   ID:  [name] [name], DOB 1945-12-23, MRN [id number]  Patient Location: Home Provider Location: Home  PCP:  Sinda Du, MD  Cardiologist:  Carlyle Dolly, MD  Electrophysiologist:  None   Evaluation Performed:  Follow-Up Visit  Chief Complaint:  5 month follow up  History of Present Illness:    [name] [name] is a 73 y.o. male with seen today for follow up of the following medical problems.   1. CAD/ICM/Chronic systolic HF - admit 10/5275 with STEMI. Received DES toLAD, PTCA of diag - 09/2017 echo LVEF 30-35%. He was discharged with lifevest for primary prevention - repeat 10/2017 35-40%, lifevest discontinued - given afib was started on plavix, asa, and anticoag x 1 month, then plavix and anticoag.   - medical therapy has been limited due to soft bp's and renal dysfunction.    -some recent left shoulder pain, can radiate into back. Not exertional. Worst with laying on left side. Otherwise no chest pains.  - continues to walk regularly 2 miles a day at brisk pace, tolerates well - compliant with meds   2. Afib - new diagnosis during his admission with acute MI. Unclear if isolated in setting of MI - started on amio, has been on 200mg  daily   02/2018 event monitor without recurrent afib,  eliquis was stopped - no recent palpitatoins  3. Hyperlipidemia - crestor increased to 40mg  daily during recent admission - compliant with statin  11/2017 TC 118 HDL 46 TG 73 LDL 57     The patient does not have symptoms concerning for COVID-19 infection (fever, chills, cough, or new shortness of breath).    Past Medical History:  Diagnosis Date  . Acute systolic heart failure ([place])   . COPD (chronic obstructive pulmonary disease) ([place])   . Hyperlipidemia   . Hypertension   . Low serum testosterone level   . PAF (paroxysmal atrial fibrillation) ([place])   . Pneumonia 05/2014  . Shortness of breath dyspnea   . STEMI (ST elevation myocardial infarction) ([place])    09/27/17 PCI/DES x1 mLAD, PTCA of the diag Aadhya Bustamante. EF 30% Lifevest at discharge.   . Urinary frequency    Past Surgical History:  Procedure Laterality Date  . APPENDECTOMY    . BACK SURGERY    . COLONOSCOPY  2011   Tubular adenoma per patient  . COLONOSCOPY N/A 03/03/2015   Procedure: COLONOSCOPY;  Surgeon: Danie Binder, MD;  [facility];  Service: Endoscopy;  Laterality: N/A;  1245 - moved to 1:00 - office to notify pt   . CORONARY/GRAFT ACUTE MI REVASCULARIZATION N/A 09/27/2017   Procedure: Coronary/Graft Acute MI Revascularization;  Surgeon: Burnell Blanks, MD;  Location: [place] [facility];  Service: Cardiovascular;  Laterality: N/A;  . EMPYEMA DRAINAGE Right 06/27/2014   Procedure: EMPYEMA DRAINAGE;  Surgeon: Grace Isaac, MD;  Location: [facility];  Service: Thoracic;  Laterality: Right;  . HEMORRHOID BANDING N/A 03/03/2015   Procedure: HEMORRHOID BANDING;  Surgeon: Danie Binder, MD;  [facility];  Service: Endoscopy;  Laterality: N/A;  Recomended to see a Psychologist, sport and exercise.  [name]    . INTRAVASCULAR ULTRASOUND/IVUS N/A 09/27/2017   Procedure: Intravascular Ultrasound/IVUS;  Surgeon: Burnell Blanks, MD;  Location: [place] [facility];  Service: Cardiovascular;   Laterality: N/A;  . LEFT HEART CATH AND CORONARY ANGIOGRAPHY N/A 09/27/2017   Procedure: LEFT HEART CATH AND CORONARY ANGIOGRAPHY;  Surgeon: Burnell Blanks, MD;  Location: [place];  Service: Cardiovascular;  Laterality: N/A;  . TEE WITHOUT CARDIOVERSION N/A 07/14/2014   Procedure: TRANSESOPHAGEAL ECHOCARDIOGRAM (TEE);  Surgeon: Sueanne Margarita, MD;  Location: [facility] ENDOSCOPY;  Service: Cardiovascular;  Laterality: N/A;  . VARICOSE VEIN SURGERY    . VIDEO ASSISTED THORACOSCOPY Right 06/27/2014   Procedure: VIDEO ASSISTED THORACOSCOPY;  Surgeon: Grace Isaac, MD;  Location: [facility];  Service: Thoracic;  Laterality: Right;  Marland Kitchen VIDEO BRONCHOSCOPY N/A 06/27/2014   Procedure: VIDEO BRONCHOSCOPY;  Surgeon: Grace Isaac, MD;  Location: [facility] OR;  Service: Thoracic;  Laterality: N/A;     No outpatient medications have been marked as taking for the 08/03/18 encounter (Appointment) with Arnoldo Lenis, MD.     Allergies:   Patient has no known allergies.   Social History   Tobacco Use  . Smoking status: Former Smoker    Types: Pipe    Last attempt to quit: 06/07/2014    Years since quitting: 4.1  . Smokeless tobacco: Never Used  Substance Use Topics  . Alcohol use: Yes    Alcohol/week: 0.0 standard drinks    Comment: Average 1-2 drinks of beer in the evening  . Drug use: No     Family Hx: The patient's family history includes COPD in his mother; Hypertension in his father and mother; Tuberculosis in his father. There is no history of Colon cancer.  ROS:   Please see the history of present illness.     All other systems reviewed and are negative.   Prior CV studies:   The following studies were reviewed today:  09/2017 cath  Prox RCA lesion is 30% stenosed.  Prox LAD lesion is 100% stenosed.  Prox Cx lesion is 20% stenosed.  A drug-eluting stent was successfully placed using a STENT SYNERGY DES 3X16.  Post intervention, there is a 0% residual stenosis.  Ost  1st Diag lesion is 100% stenosed.  Balloon angioplasty was performed using a BALLOON SAPPHIRE 2.5X12.  Post intervention, there is a 40% residual stenosis.  Prox LAD to Mid LAD lesion is 40% stenosed.  1. Acute anterior STEMI secondary to occlusion of the mid LAD 2. Successful PTCA/DES x 1 mid LAD 3. Successful PTCA/angioplasty ostium of the Diagonal [place].  4. Mild non-obstructive disease in the RCA, circumflex and mid LAD  Post cathRecommendations:  Will admit to the ICU and continue Aggrastat for 6 hours. Will start high intensity statin and a beta blocker. Echo later today.  . Recommend uninterrupted dual antiplatelet therapy with Aspirin 81mg  daily and Ticagrelor 90mg  twice dailyfor a minimum of 12 months (ACS - Class I recommendation).   09/2017 echo Study Conclusions  - Left ventricle: The cavity size was normal. There was mild focal basal hypertrophy of the septum. Systolic function was moderately to severely reduced. The estimated ejection fraction was  in the range of 30% to 35%. Severe hypokinesis of the mid to apical anteroseptal, anterior, and anterolateral myocardium. Dyskinetic at apex. Doppler parameters are consistent with abnormal left ventricular relaxation (grade 1 diastolic dysfunction). - Aortic valve: Trileaflet; mildly thickened, mildly calcified leaflets. - Mitral valve: Calcified annulus. There was trivial regurgitation. - Pulmonary arteries: PA peak pressure: 41 mm Hg (S). - Pericardium, extracardiac: A small pericardial effusion was identified.  Impressions:  - New reduction in LV EF with regional wall motion abnormalities in the mid to apical anteroseptum, anterior, and anterolateral walls with dyskinesis at the apex. Small pericardial effusion noted.  10/2017 echo Study Conclusions  - Left ventricle: The cavity size was normal. Wall thickness was increased in a pattern of moderate LVH. Systolic function was  moderately reduced. The estimated ejection fraction was in the range of 35% to 40%. Doppler parameters are consistent with abnormal left ventricular relaxation (grade 1 diastolic dysfunction). Doppler parameters are consistent with high ventricular filling pressure. - Regional wall motion abnormality: Hypokinesis of the apical septal, apical lateral, and apical myocardium. - Aortic valve: Mildly calcified annulus. Trileaflet; mildly thickened leaflets. Valve area (VTI): 4.91 cm^2. Valve area (Vmax): 4.27 cm^2. Valve area (Vmean): 3.54 cm^2. - Mitral valve: Mildly calcified annulus. Mildly thickened leaflets . - Left atrium: The atrium was moderately dilated. - Pericardium, extracardiac: There is a small circumferential pericardial effusion. The effusion measures 0.8 cm adjacent to the LV. There is no evidence of tamponade physiology. - Technically adequate study.   02/2018 heart monitor  21 day event monitor  Min HR 48, Max HR 130, Avg HR 71. Min HR occurred in early AM hours presumably while sleeping  No symptoms reported  4 beat run of NSVT that was asymptomatic Labs/Other Tests and Data Reviewed:    EKG:  No ECG reviewed.  Recent Labs: 10/01/2017: B Natriuretic Peptide 903.1 10/04/2017: Magnesium 1.8 07/30/2018: ALT 28; BUN 20; Creatinine, Ser 1.16; Hemoglobin 14.0; Platelets 253; Potassium 4.7; Sodium 138   Recent Lipid Panel No results found for: CHOL, TRIG, HDL, CHOLHDL, LDLCALC, LDLDIRECT  Wt Readings from Last 3 Encounters:  07/30/18 191 lb (86.6 kg)  03/06/18 193 lb (87.5 kg)  02/23/18 192 lb 3.9 oz (87.2 kg)     Objective:    Vital Signs:   Today's Vitals   08/03/18 0856  BP: 135/68  Pulse: 80  SpO2: 99%   There is no height or weight on file to calculate BMI.  Well nourished comfortable appearing male in no distress. Normal speech pattern and tone. Normal affect. No visual or auditory signs of SOB or wheezing   ASSESSMENT &  PLAN:    1. CAD/ICM/chronic systolic HF - medical therapy limited by soft bp's and renal dysfunction - continue current meds, doing well without symptoms, though last labs show kidneys have significantly improved - no symptoms, continue current meds. May stop plavix 09/28/18 - he is interested in repeat echo. At this time would not affect clinically, would also avoid unneccesary risks for COVID-19 exposure. May consider at next visit, particularly if renal function remains improved and bp's stable, if low LVEF confirmed could consider entresto or more aggressive regimen.  2. Afib - isolated in setting of MI, No symptomatic recurrence or evidence by recent event monitor - continue to monitor, he is off anticoag at this time  3. Hyperlipidemia - at goal, continue statin    COVID-19 Education: The signs and symptoms of COVID-19 were discussed with the patient and how to seek  care for testing (follow up with PCP or arrange E-visit).  The importance of social distancing was discussed today.  Time:   Today, I have spent 18 minutes with the patient with telehealth technology discussing the above problems.     Medication Adjustments/Labs and Tests Ordered: Current medicines are reviewed at length with the patient today.  Concerns regarding medicines are outlined above.   Tests Ordered: No orders of the defined types were placed in this encounter.   Medication Changes: No orders of the defined types were placed in this encounter.   Disposition:  Follow up 6 months  Signed, Carlyle Dolly, MD  08/03/2018 8:25 AM    [place]

## 2018-08-03 NOTE — Progress Notes (Signed)
Medication Instructions:  Stop PLAVIX 09/28/2018  Labwork: NONE  Testing/Procedures: NONE  Follow-Up: Your physician wants you to follow-up in: 6 MONTHS. You will receive a reminder letter in the mail two months in advance. If you don't receive a letter, please call our office to schedule the follow-up appointment.   Any Other Special Instructions Will Be Listed Below (If Applicable).     If you need a refill on your cardiac medications before your next appointment, please call your pharmacy.

## 2018-08-06 ENCOUNTER — Other Ambulatory Visit: Payer: Self-pay | Admitting: Cardiology

## 2018-08-06 MED ORDER — NITROGLYCERIN 0.4 MG SL SUBL: 0.4000 mg | SUBLINGUAL_TABLET | SUBLINGUAL | 2 refills | Status: DC | PRN

## 2018-08-06 NOTE — Telephone Encounter (Signed)
refilled  ntg °

## 2018-08-06 NOTE — Telephone Encounter (Signed)
Patient need new RX for NTG sent to [facility] on [address] in [place]. / tg

## 2018-08-08 DIAGNOSIS — X32XXXD Exposure to sunlight, subsequent encounter: Secondary | ICD-10-CM | POA: Diagnosis not present

## 2018-08-08 DIAGNOSIS — D225 Melanocytic nevi of trunk: Secondary | ICD-10-CM | POA: Diagnosis not present

## 2018-08-08 DIAGNOSIS — Z1283 Encounter for screening for malignant neoplasm of skin: Secondary | ICD-10-CM | POA: Diagnosis not present

## 2018-08-08 DIAGNOSIS — L570 Actinic keratosis: Secondary | ICD-10-CM | POA: Diagnosis not present

## 2018-08-09 ENCOUNTER — Other Ambulatory Visit: Payer: Self-pay

## 2018-08-09 ENCOUNTER — Ambulatory Visit (HOSPITAL_COMMUNITY)
Admission: RE | Admit: 2018-08-09 | Discharge: 2018-08-09 | Disposition: A | Discharge status: Home / Self Care | Payer: Medicare Other | Source: Ambulatory Visit | Attending: Pulmonary Disease | Admitting: Pulmonary Disease

## 2018-08-09 DIAGNOSIS — J984 Other disorders of lung: Secondary | ICD-10-CM | POA: Diagnosis not present

## 2018-08-09 DIAGNOSIS — J189 Pneumonia, unspecified organism: Secondary | ICD-10-CM | POA: Diagnosis not present

## 2018-08-09 DIAGNOSIS — J869 Pyothorax without fistula: Secondary | ICD-10-CM

## 2018-08-09 DIAGNOSIS — J439 Emphysema, unspecified: Secondary | ICD-10-CM | POA: Diagnosis not present

## 2018-08-09 MED ORDER — IOHEXOL 300 MG/ML  SOLN
75.0000 mL | Freq: Once | INTRAMUSCULAR | Status: AC | PRN
  Administered 2018-08-09: 75 mL via INTRAVENOUS

## 2018-08-24 DIAGNOSIS — I2510 Atherosclerotic heart disease of native coronary artery without angina pectoris: Secondary | ICD-10-CM | POA: Diagnosis not present

## 2018-08-24 DIAGNOSIS — J449 Chronic obstructive pulmonary disease, unspecified: Secondary | ICD-10-CM | POA: Diagnosis not present

## 2018-08-24 DIAGNOSIS — J189 Pneumonia, unspecified organism: Secondary | ICD-10-CM | POA: Diagnosis not present

## 2018-10-18 ENCOUNTER — Other Ambulatory Visit: Payer: Self-pay

## 2018-10-25 ENCOUNTER — Other Ambulatory Visit (HOSPITAL_COMMUNITY): Payer: Self-pay | Admitting: Pulmonary Disease

## 2018-10-25 ENCOUNTER — Other Ambulatory Visit: Payer: Self-pay | Admitting: Pulmonary Disease

## 2018-10-25 DIAGNOSIS — J690 Pneumonitis due to inhalation of food and vomit: Secondary | ICD-10-CM

## 2018-10-30 DIAGNOSIS — R7989 Other specified abnormal findings of blood chemistry: Secondary | ICD-10-CM | POA: Diagnosis not present

## 2018-10-30 DIAGNOSIS — Z125 Encounter for screening for malignant neoplasm of prostate: Secondary | ICD-10-CM | POA: Diagnosis not present

## 2018-10-30 DIAGNOSIS — J449 Chronic obstructive pulmonary disease, unspecified: Secondary | ICD-10-CM | POA: Diagnosis not present

## 2018-10-30 DIAGNOSIS — Z0000 Encounter for general adult medical examination without abnormal findings: Secondary | ICD-10-CM | POA: Diagnosis not present

## 2018-10-30 DIAGNOSIS — I2510 Atherosclerotic heart disease of native coronary artery without angina pectoris: Secondary | ICD-10-CM | POA: Diagnosis not present

## 2018-10-30 DIAGNOSIS — I129 Hypertensive chronic kidney disease with stage 1 through stage 4 chronic kidney disease, or unspecified chronic kidney disease: Secondary | ICD-10-CM | POA: Diagnosis not present

## 2018-10-30 DIAGNOSIS — N401 Enlarged prostate with lower urinary tract symptoms: Secondary | ICD-10-CM | POA: Diagnosis not present

## 2018-10-30 DIAGNOSIS — E785 Hyperlipidemia, unspecified: Secondary | ICD-10-CM | POA: Diagnosis not present

## 2018-10-30 DIAGNOSIS — N183 Chronic kidney disease, stage 3 (moderate): Secondary | ICD-10-CM | POA: Diagnosis not present

## 2018-10-30 DIAGNOSIS — I10 Essential (primary) hypertension: Secondary | ICD-10-CM | POA: Diagnosis not present

## 2018-10-30 LAB — CBC AND DIFFERENTIAL
HCT: 42 (ref 41–53)
Hemoglobin: 14.1 (ref 13.5–17.5)
Neutrophils Absolute: 4766
Platelets: 209 (ref 150–399)
WBC: 7.2

## 2018-10-30 LAB — LIPID PANEL
Cholesterol: 121 (ref 0–200)
HDL: 39 (ref 35–70)
LDL Cholesterol: 63
Triglycerides: 109 (ref 40–160)

## 2018-10-30 LAB — HEPATIC FUNCTION PANEL
ALT: 22 (ref 10–40)
AST: 21 (ref 14–40)
Alkaline Phosphatase: 42 (ref 25–125)

## 2018-10-30 LAB — PSA: PSA: 0.6

## 2018-10-30 LAB — CBC: RBC: 4.55 (ref 3.87–5.11)

## 2018-10-30 LAB — TSH: TSH: 2.14 (ref <=5.90)

## 2018-11-07 ENCOUNTER — Other Ambulatory Visit: Payer: Self-pay

## 2018-11-07 ENCOUNTER — Ambulatory Visit (HOSPITAL_COMMUNITY)
Admission: RE | Admit: 2018-11-07 | Discharge: 2018-11-07 | Disposition: A | Discharge status: Home / Self Care | Payer: Medicare Other | Source: Ambulatory Visit | Attending: Pulmonary Disease | Admitting: Pulmonary Disease

## 2018-11-07 DIAGNOSIS — J432 Centrilobular emphysema: Secondary | ICD-10-CM | POA: Diagnosis not present

## 2018-11-07 DIAGNOSIS — J984 Other disorders of lung: Secondary | ICD-10-CM | POA: Diagnosis not present

## 2018-11-07 DIAGNOSIS — J690 Pneumonitis due to inhalation of food and vomit: Secondary | ICD-10-CM | POA: Diagnosis not present

## 2018-11-07 MED ORDER — IOHEXOL 300 MG/ML  SOLN
75.0000 mL | Freq: Once | INTRAMUSCULAR | Status: AC | PRN
  Administered 2018-11-07: 75 mL via INTRAVENOUS

## 2018-11-09 DIAGNOSIS — Z23 Encounter for immunization: Secondary | ICD-10-CM | POA: Diagnosis not present

## 2018-11-12 ENCOUNTER — Other Ambulatory Visit: Payer: Self-pay

## 2018-11-12 DIAGNOSIS — R6889 Other general symptoms and signs: Secondary | ICD-10-CM | POA: Diagnosis not present

## 2018-11-12 DIAGNOSIS — Z20822 Contact with and (suspected) exposure to covid-19: Secondary | ICD-10-CM

## 2018-11-13 LAB — NOVEL CORONAVIRUS, NAA: SARS-CoV-2, NAA: NOT DETECTED

## 2018-11-15 ENCOUNTER — Other Ambulatory Visit (HOSPITAL_COMMUNITY): Payer: Self-pay | Admitting: Specialist

## 2018-11-15 DIAGNOSIS — I2510 Atherosclerotic heart disease of native coronary artery without angina pectoris: Secondary | ICD-10-CM | POA: Diagnosis not present

## 2018-11-15 DIAGNOSIS — R1319 Other dysphagia: Secondary | ICD-10-CM

## 2018-11-15 DIAGNOSIS — J449 Chronic obstructive pulmonary disease, unspecified: Secondary | ICD-10-CM | POA: Diagnosis not present

## 2018-11-15 DIAGNOSIS — J189 Pneumonia, unspecified organism: Secondary | ICD-10-CM | POA: Diagnosis not present

## 2018-11-16 ENCOUNTER — Telehealth (HOSPITAL_COMMUNITY): Payer: Self-pay | Admitting: Pulmonary Disease

## 2018-11-16 NOTE — Telephone Encounter (Signed)
11/16/18  Called and left patient a message that he has been scheduled for MBSS 9/3 at 1130.

## 2018-11-22 ENCOUNTER — Other Ambulatory Visit: Payer: Self-pay

## 2018-11-22 ENCOUNTER — Encounter (HOSPITAL_COMMUNITY): Payer: Self-pay | Admitting: Speech Pathology

## 2018-11-22 ENCOUNTER — Ambulatory Visit (HOSPITAL_COMMUNITY): Payer: Medicare Other | Attending: Pulmonary Disease | Admitting: Speech Pathology

## 2018-11-22 ENCOUNTER — Ambulatory Visit (HOSPITAL_COMMUNITY)
Admission: RE | Admit: 2018-11-22 | Discharge: 2018-11-22 | Disposition: A | Discharge status: Home / Self Care | Payer: Medicare Other | Source: Ambulatory Visit | Attending: Pulmonary Disease | Admitting: Pulmonary Disease

## 2018-11-22 DIAGNOSIS — R1319 Other dysphagia: Secondary | ICD-10-CM | POA: Diagnosis not present

## 2018-11-22 DIAGNOSIS — R1310 Dysphagia, unspecified: Secondary | ICD-10-CM | POA: Diagnosis not present

## 2018-11-22 DIAGNOSIS — R1312 Dysphagia, oropharyngeal phase: Secondary | ICD-10-CM | POA: Insufficient documentation

## 2018-11-22 NOTE — Therapy (Signed)
[place], [place], [address] Phone: [phone]   Fax:  [phone]  Modified Barium Swallow  Patient Details  Name: [name] [name] MRN: [id number] Date of Birth: [birth year] No data recorded  Encounter Date: 11/22/2018  End of Session - 11/22/18 2218    Visit Number  1    Number of Visits  1    Authorization Type  Medicare    SLP Start Time  1138    SLP Stop Time   1212    SLP Time Calculation (min)  34 min    Activity Tolerance  Patient tolerated treatment well       Past Medical History:  Diagnosis Date  . Acute systolic heart failure ([place])   . COPD (chronic obstructive pulmonary disease) ([place])   . Hyperlipidemia   . Hypertension   . Low serum testosterone level   . PAF (paroxysmal atrial fibrillation) ([place])   . Pneumonia 05/2014  . Shortness of breath dyspnea   . STEMI (ST elevation myocardial infarction) ([place])    09/27/17 PCI/DES x1 mLAD, PTCA of the diag branch. EF 30% Lifevest at discharge.   . Urinary frequency     Past Surgical History:  Procedure Laterality Date  . APPENDECTOMY    . BACK SURGERY    . COLONOSCOPY  2011   Tubular adenoma per patient  . COLONOSCOPY N/A 03/03/2015   Procedure: COLONOSCOPY;  Surgeon: Danie Binder, MD;  [facility];  Service: Endoscopy;  Laterality: N/A;  1245 - moved to 1:00 - office to notify pt   . CORONARY/GRAFT ACUTE MI REVASCULARIZATION N/A 09/27/2017   Procedure: Coronary/Graft Acute MI Revascularization;  Surgeon: Burnell Blanks, MD;  Location: [facility];  Service: Cardiovascular;  Laterality: N/A;  . EMPYEMA DRAINAGE Right 06/27/2014   Procedure: EMPYEMA DRAINAGE;  Surgeon: Grace Isaac, MD;  Location: [place] [facility];  Service: Thoracic;  Laterality: Right;  . HEMORRHOID BANDING N/A 03/03/2015   Procedure: HEMORRHOID BANDING;  Surgeon: Danie Binder, MD;  [facility];  Service: Endoscopy;  Laterality: N/A;  Recomended  to see a Psychologist, sport and exercise.  [name]    . INTRAVASCULAR ULTRASOUND/IVUS N/A 09/27/2017   Procedure: Intravascular Ultrasound/IVUS;  Surgeon: Burnell Blanks, MD;  Location: [facility];  Service: Cardiovascular;  Laterality: N/A;  . LEFT HEART CATH AND CORONARY ANGIOGRAPHY N/A 09/27/2017   Procedure: LEFT HEART CATH AND CORONARY ANGIOGRAPHY;  Surgeon: Burnell Blanks, MD;  Location: [facility];  Service: Cardiovascular;  Laterality: N/A;  . TEE WITHOUT CARDIOVERSION N/A 07/14/2014   Procedure: TRANSESOPHAGEAL ECHOCARDIOGRAM (TEE);  Surgeon: Sueanne Margarita, MD;  Location: [facility]COPY;  Service: Cardiovascular;  Laterality: N/A;  . VARICOSE VEIN SURGERY    . VIDEO ASSISTED THORACOSCOPY Right 06/27/2014   Procedure: VIDEO ASSISTED THORACOSCOPY;  Surgeon: Grace Isaac, MD;  Location: [facility];  Service: Thoracic;  Laterality: Right;  Marland Kitchen VIDEO BRONCHOSCOPY N/A 06/27/2014   Procedure: VIDEO BRONCHOSCOPY;  Surgeon: Grace Isaac, MD;  Location: [facility] OR;  Service: Thoracic;  Laterality: N/A;    There were no vitals filed for this visit.  Subjective Assessment - 11/22/18 2159    Subjective  "They saw something in my lungs on my chest xray."    Special Tests  MBSS    Currently in Pain?  No/denies          General - 11/22/18 2201  General Information   Date of Onset  11/15/18    HPI  [name] [name] is a 73 yo male who was referred for MBSS by Dr. Sinda Du due to suspected aspiration noted with recent CT scan. He has a history of respiratory infections and COPD at baseline (former smoker). He had MI in July of 2019. He denies difficulty swallowing.    Type of Study  MBS-Modified Barium Swallow Study    Previous Swallow Assessment  None on record    Diet Prior to this Study  Regular;Thin liquids    Temperature Spikes Noted  No    Respiratory Status  Room air    History of Recent Intubation  No    Behavior/Cognition  Alert;Cooperative;Pleasant mood    Oral  Cavity Assessment  Within Functional Limits    Oral Care Completed by SLP  No    Oral Cavity - Dentition  Adequate natural dentition    Vision  Functional for self feeding    Self-Feeding Abilities  Able to feed self    Patient Positioning  Upright in chair    Baseline Vocal Quality  Normal    Volitional Cough  Strong    Volitional Swallow  Able to elicit    Anatomy  Within functional limits    Pharyngeal Secretions  Not observed secondary MBS         Oral Preparation/Oral Phase - 11/22/18 2206      Oral Preparation/Oral Phase   Oral Phase  Within functional limits      Electrical stimulation - Oral Phase   Was Electrical Stimulation Used  No       Pharyngeal Phase - 11/22/18 2206      Pharyngeal Phase   Pharyngeal Phase  Impaired      Pharyngeal - Thin   Pharyngeal- Thin Teaspoon  Swallow initiation at vallecula   min pooling post swallow from lingual residuals into the valleculae   Pharyngeal- Thin Cup  Swallow initiation at vallecula;Penetration/Aspiration during swallow;Pharyngeal residue - pyriform;Inter-arytenoid space residue    Pharyngeal  Material does not enter airway;Material enters airway, remains ABOVE vocal cords then ejected out    Pharyngeal- Thin Straw  Within functional limits      Pharyngeal - Solids   Pharyngeal- Puree  Swallow initiation at vallecula;Within functional limits    Pharyngeal- Regular  Within functional limits;Delayed swallow initiation-vallecula    Pharyngeal- Pill  Swallow initiation at pyriform sinus;Pharyngeal residue - pyriform      Electrical Stimulation - Pharyngeal Phase   Was Electrical Stimulation Used  No       Cricopharyngeal Phase - 11/22/18 2216      Cervical Esophageal Phase   Cervical Esophageal Phase  Within functional limits        Plan - 11/22/18 2219    Clinical Impression Statement  Pt presents with mild oropharyngeal phase dysphagia characterized by premature spillage of thins when taking pill with cup  thin and when taking straw sips resulting in a small amount of penetration during the swallow and min residuals post swallow in the pyriforms (trace in valleculae). Swallow function as WNL with puree, solids, and small cup sips. No aspiration was observed. Pt independently swallowed a second time, which removed pooling in pharynx. Esophageal sweep was completed and unremarkable for cervical esophagus, small amount of barium noted in the distal esophagus before eventually clearing. Pt has had several chest x-rays and CT chests in the past several years and all seem to show some abnormalities. His  risk for aspiration appears low given today's study. Recommend regular textures and thin liquids with standard aspiration and reflux precautions. Pt to swallow 2x for each sip.    Consulted and Agree with Plan of Care  Patient       Patient will benefit from skilled therapeutic intervention in order to improve the following deficits and impairments:   Dysphagia, oropharyngeal phase    Recommendations/Treatment - 11/22/18 2216      Swallow Evaluation Recommendations   SLP Diet Recommendations  Thin;Age appropriate regular    Medication Administration  Whole meds with liquid    Supervision  Patient able to self feed    Compensations  Multiple dry swallows after each bite/sip    Postural Changes  Seated upright at 90 degrees;Remain upright for at least 30 minutes after feeds/meals       Prognosis - 11/22/18 2217      Prognosis   Prognosis for Safe Diet Advancement  Good      Individuals Consulted   Consulted and Agree with Results and Recommendations  Patient    Report Sent to   Referring physician       Problem List Patient Active Problem List   Diagnosis Date Noted  . Status post coronary artery stent placement   . Acute systolic heart failure ([place])   . PAF (paroxysmal atrial fibrillation) ([place])   . Shortness of breath   . Acute pulmonary edema ([facility])   . ST elevation myocardial  infarction (STEMI) ([place])   . S/P lumbar spinal fusion 08/02/2017  . Hemorrhoids 02/05/2015  . History of adenomatous polyp of colon 02/05/2015  . Acute and subacute infective endocarditis in diseases classified elsewhere 07/22/2014  . Empyema ([place])   . Pyrexia   . Fever 07/09/2014  . Empyema of right pleural space ([place]) 06/26/2014  . Empyema lung ([place]) 06/26/2014  . SIRS (systemic inflammatory response syndrome) ([place]) 06/10/2014  . CAP (community acquired pneumonia) 06/07/2014  . Acute respiratory failure with hypoxia ([place]) 06/07/2014  . Acute renal injury ([place]) 06/07/2014  . Dehydration 06/07/2014  . Hypertension 06/07/2014  . BPH (benign prostatic hypertrophy) 06/07/2014  . COPD (chronic obstructive pulmonary disease) ([place]) 06/07/2014   Thank you,  Genene Churn, [place]  [facility] 11/22/2018, 10:23 PM  [place] [address] [place], [place], [address] Phone: [phone]   Fax:  [phone]  Name: [name] [name] MRN: [id number] Date of Birth: [birth year]

## 2019-01-02 DIAGNOSIS — L821 Other seborrheic keratosis: Secondary | ICD-10-CM | POA: Diagnosis not present

## 2019-01-09 DIAGNOSIS — H903 Sensorineural hearing loss, bilateral: Secondary | ICD-10-CM | POA: Diagnosis not present

## 2019-01-10 ENCOUNTER — Ambulatory Visit (INDEPENDENT_AMBULATORY_CARE_PROVIDER_SITE_OTHER): Payer: Medicare Other | Admitting: Otolaryngology

## 2019-01-10 ENCOUNTER — Other Ambulatory Visit: Payer: Self-pay

## 2019-01-10 DIAGNOSIS — R1312 Dysphagia, oropharyngeal phase: Secondary | ICD-10-CM

## 2019-01-10 DIAGNOSIS — Z8701 Personal history of pneumonia (recurrent): Secondary | ICD-10-CM

## 2019-01-16 DIAGNOSIS — J449 Chronic obstructive pulmonary disease, unspecified: Secondary | ICD-10-CM | POA: Diagnosis not present

## 2019-01-16 LAB — BASIC METABOLIC PANEL
BUN: 20 (ref 4–21)
CO2: 32 — AB (ref 13–22)
Chloride: 103 (ref 99–108)
Creatinine: 1.3 (ref 0.6–1.3)
Glucose: 78
Potassium: 4.6 (ref 3.4–5.3)
Sodium: 141 (ref 137–147)

## 2019-01-16 LAB — COMPREHENSIVE METABOLIC PANEL
Calcium: 9.3 (ref 8.7–10.7)
GFR calc Af Amer: 61
GFR calc non Af Amer: 53

## 2019-01-31 DIAGNOSIS — Z0000 Encounter for general adult medical examination without abnormal findings: Secondary | ICD-10-CM | POA: Diagnosis not present

## 2019-02-01 DIAGNOSIS — B351 Tinea unguium: Secondary | ICD-10-CM | POA: Diagnosis not present

## 2019-02-04 ENCOUNTER — Ambulatory Visit: Payer: Medicare Other | Admitting: Cardiology

## 2019-02-06 ENCOUNTER — Ambulatory Visit (INDEPENDENT_AMBULATORY_CARE_PROVIDER_SITE_OTHER): Payer: Medicare Other | Admitting: Cardiology

## 2019-02-06 ENCOUNTER — Encounter: Payer: Self-pay | Admitting: Cardiology

## 2019-02-06 ENCOUNTER — Other Ambulatory Visit: Payer: Self-pay

## 2019-02-06 VITALS — BP 130/74 | HR 60 | Temp 96.6°F | Ht 72.0 in | Wt 195.0 lb

## 2019-02-06 DIAGNOSIS — I5022 Chronic systolic (congestive) heart failure: Secondary | ICD-10-CM | POA: Diagnosis not present

## 2019-02-06 DIAGNOSIS — E782 Mixed hyperlipidemia: Secondary | ICD-10-CM | POA: Diagnosis not present

## 2019-02-06 DIAGNOSIS — I2510 Atherosclerotic heart disease of native coronary artery without angina pectoris: Secondary | ICD-10-CM

## 2019-02-06 MED ORDER — ROSUVASTATIN CALCIUM 40 MG PO TABS: 40.0000 mg | ORAL_TABLET | Freq: Every day | ORAL | 3 refills | Status: DC

## 2019-02-06 MED ORDER — CARVEDILOL 6.25 MG PO TABS: 6.2500 mg | ORAL_TABLET | Freq: Two times a day (BID) | ORAL | 3 refills | Status: DC

## 2019-02-06 NOTE — Patient Instructions (Signed)

## 2019-02-06 NOTE — Progress Notes (Signed)
Clinical Summary [name] [name] is a 73 y.o.male seen today for follow up of the following medical problems.   1. CAD/ICM/Chronic systolic HF - admit [id number] with STEMI. Received DES toLAD, PTCA of diag - 09/2017 echo LVEF 30-35%. He was discharged with lifevest for primary prevention - repeat 10/2017 35-40%, lifevest discontinued - given afib was started on plavix, asa, and anticoag x 1 month, then plavix and anticoag.  - medical therapy has been limited due to soft bp's and prior renal dysfunction.   - breathing continues to improve over time.  - no recent edema - exercising regularly, does treadmill x 45 min and tolerates without troubles.    2. COPD - followed by pcp    3. Afib in setting of STEMI - new diagnosis during his admission with acute MI. Unclear if isolated in setting of MI 02/2018 event monitor without recurrent afib, eliquis was stopped   3. Hyperlipidemia 11/2017 TC 118 HDL 46 TG 73 LDL 57 - labs followed by pcp - compliant with statin      Past Medical History:  Diagnosis Date  . Acute systolic heart failure ([place])   . COPD (chronic obstructive pulmonary disease) ([place])   . Hyperlipidemia   . Hypertension   . Low serum testosterone level   . PAF (paroxysmal atrial fibrillation) ([place])   . Pneumonia 05/2014  . Shortness of breath dyspnea   . STEMI (ST elevation myocardial infarction) ([place])    09/27/17 PCI/DES x1 mLAD, PTCA of the diag [name] [name]. EF 30% Lifevest at discharge.   . Urinary frequency      No Known Allergies   Current Outpatient Medications  Medication Sig Dispense Refill  . amoxicillin-clavulanate (AUGMENTIN) 875-125 MG tablet Take 1 tablet by mouth every 12 (twelve) hours. 14 tablet 0  . aspirin EC 81 MG tablet Take 1 tablet (81 mg total) by mouth daily. 90 tablet 3  . Calcium Citrate-Vitamin D (CALCIUM CITRATE + D3 PO) Take 1 tablet by mouth daily.    . carvedilol (COREG) 6.25 MG tablet Take 1 tablet (6.25 mg total) by  mouth 2 (two) times daily with a meal. 180 tablet 3  . clopidogrel (PLAVIX) 75 MG tablet Take 1 tablet (75 mg total) by mouth daily. 90 tablet 2  . doxycycline (VIBRAMYCIN) 100 MG capsule Take 1 capsule (100 mg total) by mouth 2 (two) times daily. 20 capsule 0  . Krill Oil 350 MG CAPS Take 350 mg by mouth daily.    . Lactobacillus (ACIDOPHILUS PROBIOTIC) 10 MG TABS Take 10 mg by mouth daily.     Marland Kitchen losartan (COZAAR) 25 MG tablet Take 1 tablet (25 mg total) by mouth daily. 90 tablet 3  . Multiple Vitamins-Minerals (SENIOR VITES PO) Take 1 tablet by mouth daily.    . nitroGLYCERIN (NITROSTAT) 0.4 MG SL tablet Place 1 tablet (0.4 mg total) under the tongue every 5 (five) minutes as needed. 25 tablet 2  . rosuvastatin (CRESTOR) 40 MG tablet Take 1 tablet (40 mg total) by mouth daily. 90 tablet 3  . tamsulosin (FLOMAX) 0.4 MG CAPS capsule Take 0.4 mg by mouth daily.     Marland Kitchen tiotropium (SPIRIVA) 18 MCG inhalation capsule Place 1 capsule (18 mcg total) into inhaler and inhale daily. 30 capsule 1   No current facility-administered medications for this visit.      Past Surgical History:  Procedure Laterality Date  . APPENDECTOMY    . BACK SURGERY    . COLONOSCOPY  2011  Tubular adenoma per patient  . COLONOSCOPY N/A 03/03/2015   Procedure: COLONOSCOPY;  Surgeon: [name] Binder, MD;  [facility];  Service: Endoscopy;  Laterality: N/A;  1245 - moved to 1:00 - office to notify pt   . CORONARY/GRAFT ACUTE MI REVASCULARIZATION N/A 09/27/2017   Procedure: Coronary/Graft Acute MI Revascularization;  Surgeon: [name] Blanks, MD;  Location: [facility];  Service: Cardiovascular;  Laterality: N/A;  . EMPYEMA DRAINAGE Right 06/27/2014   Procedure: EMPYEMA DRAINAGE;  Surgeon: [name] Isaac, MD;  Location: [facility];  Service: Thoracic;  Laterality: Right;  . HEMORRHOID BANDING N/A 03/03/2015   Procedure: HEMORRHOID BANDING;  Surgeon: [name] Binder, MD;  [facility]: [facility];   Service: Endoscopy;  Laterality: N/A;  Recomended to see a Psychologist, sport and exercise.  [name]    . INTRAVASCULAR ULTRASOUND/IVUS N/A 09/27/2017   Procedure: Intravascular Ultrasound/IVUS;  Surgeon: [name] Blanks, MD;  Location: [facility];  Service: Cardiovascular;  Laterality: N/A;  . LEFT HEART CATH AND CORONARY ANGIOGRAPHY N/A 09/27/2017   Procedure: LEFT HEART CATH AND CORONARY ANGIOGRAPHY;  Surgeon: [name] Blanks, MD;  Location: [facility];  Service: Cardiovascular;  Laterality: N/A;  . TEE WITHOUT CARDIOVERSION N/A 07/14/2014   Procedure: TRANSESOPHAGEAL ECHOCARDIOGRAM (TEE);  Surgeon: [name] Margarita, MD;  Location[facility];  Service: Cardiovascular;  Laterality: N/A;  . VARICOSE VEIN SURGERY    . VIDEO ASSISTED THORACOSCOPY Right 06/27/2014   Procedure: VIDEO ASSISTED THORACOSCOPY;  Surgeon: [name] Isaac, MD;  Location: [facility];  Service: Thoracic;  Laterality: Right;  Marland Kitchen VIDEO BRONCHOSCOPY N/A 06/27/2014   Procedure: VIDEO BRONCHOSCOPY;  Surgeon: [name] Isaac, MD;  Location: [facility];  Service: Thoracic;  Laterality: N/A;     No Known Allergies    Family History  Problem Relation Age of Onset  . Tuberculosis Father   . Hypertension Father   . COPD Mother   . Hypertension Mother   . Colon cancer Neg Hx      Social History [name] reports that he quit smoking about 4 years ago. His smoking use included pipe. He has never used smokeless tobacco. [name] reports current alcohol use.   Review of Systems CONSTITUTIONAL: No weight loss, fever, chills, weakness or fatigue.  HEENT: Eyes: No visual loss, blurred vision, double vision or yellow sclerae.No hearing loss, sneezing, congestion, runny nose or sore throat.  SKIN: No rash or itching.  CARDIOVASCULAR: per hpi RESPIRATORY: No shortness of breath, cough or sputum.  GASTROINTESTINAL: No anorexia, nausea, vomiting or diarrhea. No abdominal pain or blood.  GENITOURINARY: No burning on  urination, no polyuria NEUROLOGICAL: No headache, dizziness, syncope, paralysis, ataxia, numbness or tingling in the extremities. No change in bowel or bladder control.  MUSCULOSKELETAL: No muscle, back pain, joint pain or stiffness.  LYMPHATICS: No enlarged nodes. No history of splenectomy.  PSYCHIATRIC: No history of depression or anxiety.  ENDOCRINOLOGIC: No reports of sweating, cold or heat intolerance. No polyuria or polydipsia.  Marland Kitchen   Physical Examination Today's Vitals   02/06/19 1057  BP: 130/74  Pulse: 60  Temp: (!) 96.6 F (35.9 C)  TempSrc: Temporal  SpO2: 96%  Weight: 195 lb (88.5 kg)  Height: 6' (1.829 m)   Body mass index is 26.45 kg/m.  Gen: resting comfortably, no acute distress HEENT: no scleral icterus, pupils equal round and reactive, no palptable cervical adenopathy,  CV: RRR, no m/r/g, no jvd Resp: Clear to auscultation bilaterally GI:  abdomen is soft, non-tender, non-distended, normal bowel sounds, no hepatosplenomegaly MSK: extremities are warm, no edema.  Skin: warm, no rash Neuro:  no focal deficits Psych: appropriate affect   Diagnostic Studies  09/2017 cath  Prox RCA lesion is 30% stenosed.  Prox LAD lesion is 100% stenosed.  Prox Cx lesion is 20% stenosed.  A drug-eluting stent was successfully placed using a STENT SYNERGY DES 3X16.  Post intervention, there is a 0% residual stenosis.  Ost 1st Diag lesion is 100% stenosed.  Balloon angioplasty was performed using a BALLOON SAPPHIRE 2.5X12.  Post intervention, there is a 40% residual stenosis.  Prox LAD to Mid LAD lesion is 40% stenosed.  1. Acute anterior STEMI secondary to occlusion of the mid LAD 2. Successful PTCA/DES x 1 mid LAD 3. Successful PTCA/angioplasty ostium of the Diagonal [place].  4. Mild non-obstructive disease in the RCA, circumflex and mid LAD  Post cathRecommendations:  Will admit to the ICU and continue Aggrastat for 6 hours. Will start high intensity statin  and a beta blocker. Echo later today.  . Recommend uninterrupted dual antiplatelet therapy with Aspirin 81mg  daily and Ticagrelor 90mg  twice dailyfor a minimum of 12 months (ACS - Class I recommendation).   09/2017 echo Study Conclusions  - Left ventricle: The cavity size was normal. There was mild focal basal hypertrophy of the septum. Systolic function was moderately to severely reduced. The estimated ejection fraction was in the range of 30% to 35%. Severe hypokinesis of the mid to apical anteroseptal, anterior, and anterolateral myocardium. Dyskinetic at apex. Doppler parameters are consistent with abnormal left ventricular relaxation (grade 1 diastolic dysfunction). - Aortic valve: Trileaflet; mildly thickened, mildly calcified leaflets. - Mitral valve: Calcified annulus. There was trivial regurgitation. - Pulmonary arteries: PA peak pressure: 41 mm Hg (S). - Pericardium, extracardiac: A small pericardial effusion was identified.  Impressions:  - New reduction in LV EF with regional wall motion abnormalities in the mid to apical anteroseptum, anterior, and anterolateral walls with dyskinesis at the apex. Small pericardial effusion noted.  10/2017 echo Study Conclusions  - Left ventricle: The cavity size was normal. Wall thickness was increased in a pattern of moderate LVH. Systolic function was moderately reduced. The estimated ejection fraction was in the range of 35% to 40%. Doppler parameters are consistent with abnormal left ventricular relaxation (grade 1 diastolic dysfunction). Doppler parameters are consistent with high ventricular filling pressure. - Regional wall motion abnormality: Hypokinesis of the apical septal, apical lateral, and apical myocardium. - Aortic valve: Mildly calcified annulus. Trileaflet; mildly thickened leaflets. Valve area (VTI): 4.91 cm^2. Valve area (Vmax): 4.27 cm^2. Valve area (Vmean): 3.54  cm^2. - Mitral valve: Mildly calcified annulus. Mildly thickened leaflets . - Left atrium: The atrium was moderately dilated. - Pericardium, extracardiac: There is a small circumferential pericardial effusion. The effusion measures 0.8 cm adjacent to the LV. There is no evidence of tamponade physiology. - Technically adequate study.   02/2018 heart monitor  21 day event monitor  Min HR 48, Max HR 130, Avg HR 71. Min HR occurred in early AM hours presumably while sleeping  No symptoms reported  4 beat run of NSVT that was asymptomatic   Assessment and Plan  1. CAD/ICM/chronic systolic HF - medical therapy limited by soft bp's and prior renal dysfunction though over time renal function has improved - repeat echo. If ongoing systolic dysfunction start entresto, if normalized LVEF would start lisionpril   2. Hyperlipidemia - continue statin, request pcp labs  F/u 3 months  Arnoldo Lenis, M.D

## 2019-02-07 DIAGNOSIS — Z1211 Encounter for screening for malignant neoplasm of colon: Secondary | ICD-10-CM | POA: Diagnosis not present

## 2019-02-07 DIAGNOSIS — Z1212 Encounter for screening for malignant neoplasm of rectum: Secondary | ICD-10-CM | POA: Diagnosis not present

## 2019-02-15 DIAGNOSIS — N401 Enlarged prostate with lower urinary tract symptoms: Secondary | ICD-10-CM | POA: Insufficient documentation

## 2019-02-15 DIAGNOSIS — M5430 Sciatica, unspecified side: Secondary | ICD-10-CM | POA: Insufficient documentation

## 2019-02-15 DIAGNOSIS — E291 Testicular hypofunction: Secondary | ICD-10-CM | POA: Insufficient documentation

## 2019-02-15 DIAGNOSIS — R911 Solitary pulmonary nodule: Secondary | ICD-10-CM | POA: Insufficient documentation

## 2019-02-15 DIAGNOSIS — J869 Pyothorax without fistula: Secondary | ICD-10-CM | POA: Insufficient documentation

## 2019-02-22 ENCOUNTER — Other Ambulatory Visit: Payer: Self-pay

## 2019-02-22 ENCOUNTER — Other Ambulatory Visit (HOSPITAL_COMMUNITY): Payer: Medicare Other

## 2019-02-22 ENCOUNTER — Ambulatory Visit (HOSPITAL_COMMUNITY): Payer: Medicare Other

## 2019-02-22 ENCOUNTER — Encounter: Payer: Self-pay | Admitting: Gastroenterology

## 2019-02-22 ENCOUNTER — Ambulatory Visit (HOSPITAL_COMMUNITY)
Admission: RE | Admit: 2019-02-22 | Discharge: 2019-02-22 | Disposition: A | Discharge status: Home / Self Care | Payer: Medicare Other | Source: Ambulatory Visit | Attending: Cardiology | Admitting: Cardiology

## 2019-02-22 DIAGNOSIS — I5022 Chronic systolic (congestive) heart failure: Secondary | ICD-10-CM

## 2019-02-22 NOTE — Progress Notes (Signed)
*  PRELIMINARY RESULTS* Echocardiogram 2D Echocardiogram has been performed.  [name] [name] 02/22/2019, 2:56 PM

## 2019-02-26 DIAGNOSIS — L308 Other specified dermatitis: Secondary | ICD-10-CM | POA: Diagnosis not present

## 2019-03-04 ENCOUNTER — Telehealth: Payer: Self-pay

## 2019-03-04 DIAGNOSIS — I10 Essential (primary) hypertension: Secondary | ICD-10-CM

## 2019-03-04 MED ORDER — LISINOPRIL 5 MG PO TABS: 5.0000 mg | ORAL_TABLET | Freq: Every day | ORAL | 3 refills | Status: DC

## 2019-03-04 NOTE — Telephone Encounter (Signed)
Pt made aware, sent RX for lisinopril. He will have labs done in 2 weeks.

## 2019-03-04 NOTE — Telephone Encounter (Signed)
-----   Message from [place] sent at 03/04/2019 11:58 AM EST -----  ----- Message ----- From: Arnoldo Lenis, MD Sent: 03/04/2019  11:01 AM EST To: Massie Maroon, CMA  Has had some improvement in heart function, up to 40-45% but not quite back to normal. (normal is 50-60%). Can we start lisinopril 5mg  daily, bmet in 2 weeks.   Zandra Abts MD

## 2019-03-11 ENCOUNTER — Encounter: Payer: Self-pay | Admitting: Gastroenterology

## 2019-03-11 ENCOUNTER — Other Ambulatory Visit: Payer: Self-pay | Admitting: *Deleted

## 2019-03-11 ENCOUNTER — Other Ambulatory Visit: Payer: Self-pay

## 2019-03-11 ENCOUNTER — Ambulatory Visit (INDEPENDENT_AMBULATORY_CARE_PROVIDER_SITE_OTHER): Payer: Medicare Other | Admitting: Gastroenterology

## 2019-03-11 ENCOUNTER — Encounter: Payer: Self-pay | Admitting: *Deleted

## 2019-03-11 DIAGNOSIS — K5900 Constipation, unspecified: Secondary | ICD-10-CM | POA: Diagnosis not present

## 2019-03-11 DIAGNOSIS — R195 Other fecal abnormalities: Secondary | ICD-10-CM

## 2019-03-11 DIAGNOSIS — I2510 Atherosclerotic heart disease of native coronary artery without angina pectoris: Secondary | ICD-10-CM | POA: Diagnosis not present

## 2019-03-11 MED ORDER — PEG 3350-KCL-NA BICARB-NACL 420 G PO SOLR: ORAL | 0 refills | Status: DC

## 2019-03-11 NOTE — Assessment & Plan Note (Addendum)
73 y/o male with recent positive Cologuard test. Last colonoscopy 02/2015. Prior history of colonic adenomas but none seen 02/2015. He reports intermittent brbpr due to hemorrhoids. Explained that his Cologuard test could be positive due to blood in stool or due to DNA linked to polyps/colon cancer but test does not delineate what part of the test is positive. At this point, would advice diagnostic colonoscopy with overtube.  I have discussed the risks, alternatives, benefits with regards to but not limited to the risk of reaction to medication, bleeding, infection, perforation and the patient is agreeable to proceed. Written consent to be obtained.

## 2019-03-11 NOTE — Progress Notes (Signed)
Please send copy to Dr. Kathaleen Grinder office and to Dr. Benny Lennert, his future PCP.

## 2019-03-11 NOTE — Progress Notes (Addendum)
REVIEWED. Next TCS APR 2021 OR SOONER. PUT PT ON A CANCELLATION LIST.  Primary Care Physician: Sinda Du, MD  Primary Gastroenterologist:  Barney Drain, MD   Chief Complaint  Patient presents with  . + cologuard    TCS last done 2016. has occas bleeding but relates to hemorrhoids    HPI: [name] [name] is a 73 y.o. male here at the request of Dr. [name] for further evaluation of positive Cologuard.  Patient last seen in November 2016 for hemorrhoids.  Patient was significant CAD/ICM/chronic systolic heart failure, A. fib after acute MI currently not anticoagulated, COPD.  Recent echo with EF of 40 to 45% up from 30-35% in 09/2017.  July 2019 he had acute anterior STEMI secondary to occlusion of the mid LAD.  He underwent successful PTCA/DES of the mid LAD, successful PTCA/angioplasty ostium of the diagonal branch.  Post cath day 2 he developed A. fib with RVR.  In 2016 he developed pneumonia complicated by empyema.  Required surgical intervention and underwent drainage and decortication of empyema.  Tissue grew microaerophilic strep.  Treated with Zosyn and then Augmentin.  He continued with fevers and persistent leukocytosis.  Seen by infectious disease and there was question of possible endocarditis. Transesophageal echocardiogram April 2016 with thickening of the PED with possible mobile mass on the pulmonary artery side of the valve leaflet.  Valve is not well visualized and there is no PR.  Cannot rule out vegetation based on the study.  Consensus with ID and cardiovascular was that it was not likely endocarditis.  Patient states he is doing a lot better. Works out almost every day. He has not gained all of his strength and endurance back yet. Recently completed Cologuard for Dr. [name]. It was positive. He has intermittent brbpr which he feels is from hemorrhoids. He manages his constipation by eating prunes every day. No melena. No abdominal pain. No UGI symptoms.   Last  colonoscopy 02/2015, mild diverticulosis, external/internal hemorrhoids, redundant left colon. Advised next colonoscopy in 5 years (h/o adenomas) with overtube.   Patient did not have hemorrhoid surgery after last colonoscopy as recommended. States he is not bothered by hemorrhoids, no rectal pain and limited bleeding. He reports surgery before and hemorrhoids came back.    Current Outpatient Medications  Medication Sig Dispense Refill  . ALPRAZolam (XANAX) 0.25 MG tablet Take 0.25 mg by mouth at bedtime as needed.    Marland Kitchen aspirin EC 81 MG tablet Take 1 tablet (81 mg total) by mouth daily. 90 tablet 3  . Calcium Citrate-Vitamin D (CALCIUM CITRATE + D3 PO) Take 1 tablet by mouth daily.    . carvedilol (COREG) 6.25 MG tablet Take 1 tablet (6.25 mg total) by mouth 2 (two) times daily with a meal. 180 tablet 3  . ketoconazole (NIZORAL) 2 % cream as needed.    [name] Oil 350 MG CAPS Take 350 mg by mouth daily.    . Lactobacillus (ACIDOPHILUS PROBIOTIC) 10 MG TABS Take 10 mg by mouth daily.     Marland Kitchen lisinopril (ZESTRIL) 5 MG tablet Take 1 tablet (5 mg total) by mouth daily. 90 tablet 3  . Multiple Vitamins-Minerals (SENIOR VITES PO) Take 1 tablet by mouth daily.    . nitroGLYCERIN (NITROSTAT) 0.4 MG SL tablet Place 1 tablet (0.4 mg total) under the tongue every 5 (five) minutes as needed. 25 tablet 2  . rosuvastatin (CRESTOR) 40 MG tablet Take 1 tablet (40 mg total) by mouth daily. 90 tablet  3  . tamsulosin (FLOMAX) 0.4 MG CAPS capsule Take 0.4 mg by mouth daily.     Marland Kitchen tiotropium (SPIRIVA) 18 MCG inhalation capsule Place 1 capsule (18 mcg total) into inhaler and inhale daily. 30 capsule 1  . traZODone (DESYREL) 50 MG tablet Take 50 mg by mouth at bedtime as needed for sleep.     No current facility-administered medications for this visit.    Allergies as of 03/11/2019  . (No Known Allergies)   Past Medical History:  Diagnosis Date  . Acute systolic heart failure ([place])   . COPD (chronic  obstructive pulmonary disease) ([place])   . Enlarged prostate with lower urinary tract symptoms (LUTS)   . Hyperlipidemia   . Hypertension   . Low serum testosterone level   . PAF (paroxysmal atrial fibrillation) ([place])   . Pneumonia 05/2014  . Pyothorax without fistula ([place])   . Sciatica   . Shortness of breath dyspnea   . Solitary pulmonary nodule   . STEMI (ST elevation myocardial infarction) ([place])    09/27/17 PCI/DES x1 mLAD, PTCA of the diag branch. EF 30% Lifevest at discharge.   . Testicular hypofunction   . Urinary frequency    Past Surgical History:  Procedure Laterality Date  . APPENDECTOMY    . BACK SURGERY    . COLONOSCOPY  2011   Tubular adenoma per patient  . COLONOSCOPY N/A 03/03/2015   Dr. Oneida Alar: Left colon is redundant, mild diverticulosis, small internal hemorrhoids, moderate external hemorrhoids.  Next colonoscopy in 5 years with overtube.  [name] N/A 09/27/2017   Procedure: Coronary/Graft Acute MI Revascularization;  Surgeon: Burnell Blanks, MD;  Location: [facility];  Service: Cardiovascular;  Laterality: N/A;  . EMPYEMA DRAINAGE Right 06/27/2014   Procedure: EMPYEMA DRAINAGE;  Surgeon: Grace Isaac, MD;  Location: [facility];  Service: Thoracic;  Laterality: Right;  . HEMORRHOID BANDING N/A 03/03/2015   Procedure: HEMORRHOID BANDING;  Surgeon: Danie Binder, MD;  [facility];  Service: Endoscopy;  Laterality: N/A;  Recomended to see a Psychologist, sport and exercise.  [name]    . INTRAVASCULAR ULTRASOUND/IVUS N/A 09/27/2017   Procedure: Intravascular Ultrasound/IVUS;  Surgeon: Burnell Blanks, MD;  Location: [facility];  Service: Cardiovascular;  Laterality: N/A;  . LEFT HEART CATH AND CORONARY ANGIOGRAPHY N/A 09/27/2017   Procedure: LEFT HEART CATH AND CORONARY ANGIOGRAPHY;  Surgeon: Burnell Blanks, MD;  Location: [facility];  Service: Cardiovascular;  Laterality: N/A;  . TEE WITHOUT  CARDIOVERSION N/A 07/14/2014   Procedure: TRANSESOPHAGEAL ECHOCARDIOGRAM (TEE);  Surgeon: Sueanne Margarita, MD;  Location: [facility];  Service: Cardiovascular;  Laterality: N/A;  . VARICOSE VEIN SURGERY    . VIDEO ASSISTED THORACOSCOPY Right 06/27/2014   Procedure: VIDEO ASSISTED THORACOSCOPY;  Surgeon: Grace Isaac, MD;  Location: [facility];  Service: Thoracic;  Laterality: Right;  Marland Kitchen VIDEO BRONCHOSCOPY N/A 06/27/2014   Procedure: VIDEO BRONCHOSCOPY;  Surgeon: Grace Isaac, MD;  Location: [facility] OR;  Service: Thoracic;  Laterality: N/A;   Family History  Problem Relation Age of Onset  . Tuberculosis Father   . Hypertension Father   . COPD Mother   . Hypertension Mother   . Colon cancer Neg Hx    Social History   Tobacco Use  . Smoking status: Former Smoker    Types: Pipe    Quit date: 06/07/2014    Years since quitting: 4.7  . Smokeless tobacco: Never Used  Substance Use Topics  . Alcohol use: Yes    Alcohol/week: 0.0 standard drinks    Comment: Average 1-2 drinks of beer in the evening  . Drug use: No     ROS:  General: Negative for anorexia, weight loss, fever, chills, +fatigue, weakness. ENT: Negative for hoarseness, difficulty swallowing , nasal congestion. CV: Negative for chest pain, angina, palpitations, dyspnea on exertion, peripheral edema.  Respiratory: Negative for dyspnea at rest, dyspnea on exertion, cough, sputum, wheezing.  GI: See history of present illness. GU:  Negative for dysuria, hematuria, urinary incontinence, urinary frequency, nocturnal urination.  Endo: Negative for unusual weight change.    Physical Examination:   BP 120/66   Pulse 61   Temp (!) 97.1 F (36.2 C) (Oral)   Ht 6' (1.829 m)   Wt 197 lb 3.2 oz (89.4 kg)   BMI 26.75 kg/m   General: Well-nourished, well-developed in no acute distress.  Eyes: No icterus. Mouth: masked. Lungs: Clear to auscultation bilaterally.  Heart: Regular rate and rhythm, no murmurs rubs or gallops.    Abdomen: Bowel sounds are normal, nontender, nondistended, no hepatosplenomegaly or masses, no abdominal bruits or hernia , no rebound or guarding.   Extremities: No lower extremity edema. No clubbing or deformities. Neuro: Alert and oriented x 4   Skin: Warm and dry, no jaundice.   Psych: Alert and cooperative, normal mood and affect.  Labs:  Lab Results  Component Value Date   CREATININE 1.3 01/16/2019   BUN 20 01/16/2019   NA 141 01/16/2019   K 4.6 01/16/2019   CL 103 01/16/2019   CO2 32 (A) 01/16/2019   Lab Results  Component Value Date   ALT 22 10/30/2018   AST 21 10/30/2018   ALKPHOS 42 10/30/2018   BILITOT 0.7 07/30/2018   Lab Results  Component Value Date   WBC 7.2 10/30/2018   HGB 14.1 10/30/2018   HCT 42 10/30/2018   MCV 96.2 07/30/2018   PLT 209 10/30/2018    Imaging Studies: ECHOCARDIOGRAM COMPLETE  Result Date: 02/22/2019   ECHOCARDIOGRAM REPORT   Patient Name:   [name] [name] Date of Exam: 02/22/2019 Medical Rec #:  [id number]       Height:       72.0 in Accession #:    [id number]      Weight:       195.0 lb Date of Birth:  01-05-1946       BSA:          2.11 m Patient Age:    52 years        BP:           128/66 mmHg Patient Gender: M               HR:           61 bpm. Exam Location:  [facility] Procedure: 2D Echo, Cardiac Doppler and Color Doppler Indications:    I50.22 (ICD-10-CM) - Chronic systolic heart failure  History:        Patient has prior history of Echocardiogram examinations, most                 recent 11/09/2017. Previous Myocardial Infarction, COPD; Risk                 Factors:Hypertension and Dyslipidemia. PAF (paroxysmal atrial                 fibrillation),Status post coronary artery stent placement,Acute  respiratory failure with hypoxia.  Sonographer:    Alvino Chapel RCS Referring Phys: [id number] [place]  1. Left ventricular ejection fraction, by visual estimation, is 40 to 45%. The left ventricle has mildly  decreased function. There is moderately increased left ventricular hypertrophy.  2. Elevated left atrial pressure.  3. Left ventricular diastolic parameters are consistent with Grade I diastolic dysfunction (impaired relaxation).  4. Hypokinesis of the apex, mid to distal anteroseptal, apical lateral, and apical myocardium.  5. Global right ventricle has normal systolic function.The right ventricular size is normal. No increase in right ventricular wall thickness.  6. Left atrial size was moderately dilated.  7. Right atrial size was normal.  8. The mitral valve is normal in structure. Mild mitral valve regurgitation. No evidence of mitral stenosis.  9. The tricuspid valve is normal in structure. Tricuspid valve regurgitation is not demonstrated. 10. The aortic valve is tricuspid. Aortic valve regurgitation is not visualized. No evidence of aortic valve sclerosis or stenosis. 11. The pulmonic valve was not well visualized. Pulmonic valve regurgitation is not visualized. 12. The inferior vena cava is normal in size with greater than 50% respiratory variability, suggesting right atrial pressure of 3 mmHg. FINDINGS  Left Ventricle: Left ventricular ejection fraction, by visual estimation, is 40 to 45%. The left ventricle has mildly decreased function. There is moderately increased left ventricular hypertrophy. Left ventricular diastolic parameters are consistent with Grade I diastolic dysfunction (impaired relaxation). Elevated left atrial pressure. Hypokinesis of the apex, mid to distal anteroseptal, apical lateral, and apical myocardium. Right Ventricle: The right ventricular size is normal. No increase in right ventricular wall thickness. Global RV systolic function is has normal systolic function. Left Atrium: Left atrial size was moderately dilated. Right Atrium: Right atrial size was normal in size Pericardium: There is no evidence of pericardial effusion. Mitral Valve: The mitral valve is normal in structure. No  evidence of mitral valve stenosis by observation. Mild mitral valve regurgitation. Tricuspid Valve: The tricuspid valve is normal in structure. Tricuspid valve regurgitation is not demonstrated. Aortic Valve: The aortic valve is tricuspid. Aortic valve regurgitation is not visualized. The aortic valve is structurally normal, with no evidence of sclerosis or stenosis. Aortic valve mean gradient measures 2.8 mmHg. Aortic valve peak gradient measures 5.2 mmHg. Aortic valve area, by VTI measures 3.70 cm. Pulmonic Valve: The pulmonic valve was not well visualized. Pulmonic valve regurgitation is not visualized. No evidence of pulmonic stenosis. Aorta: The aortic root is normal in size and structure. Pulmonary Artery: Indeterminate PASP, inadequate TR jet [id number]. Venous: The inferior vena cava is normal in size with greater than 50% respiratory variability, suggesting right atrial pressure of 3 mmHg. IAS/Shunts: No atrial level shunt detected by color flow Doppler.  LEFT VENTRICLE PLAX 2D LVIDd:         5.43 cm       Diastology LVIDs:         3.76 cm       LV e' lateral:   3.70 cm/s LV PW:         1.30 cm       LV E/e' lateral: 17.7 LV IVS:        1.39 cm       LV e' medial:    4.79 cm/s LVOT diam:     2.40 cm       LV E/e' medial:  13.7 LV SV:         83 ml LV SV Index:  38.90 LVOT Area:     4.52 cm  LV Volumes (MOD) LV area d, A2C:    33.20 cm LV area d, A4C:    40.50 cm LV area s, A2C:    23.70 cm LV area s, A4C:    26.90 cm LV major d, A2C:   8.09 cm LV major d, A4C:   8.35 cm LV major s, A2C:   7.48 cm LV major s, A4C:   7.70 cm LV vol d, MOD A2C: 115.0 ml LV vol d, MOD A4C: 161.0 ml LV vol s, MOD A2C: 61.7 ml LV vol s, MOD A4C: 77.4 ml LV SV MOD A2C:     53.3 ml LV SV MOD A4C:     161.0 ml LV SV MOD BP:      67.6 ml RIGHT VENTRICLE RV S prime:     14.80 cm/s TAPSE (M-mode): 1.8 cm LEFT ATRIUM             Index       RIGHT ATRIUM           Index LA diam:        3.60 cm 1.71 cm/m  RA Area:     15.20 cm LA Vol  (A2C):   90.1 ml 42.75 ml/m RA Volume:   37.10 ml  17.60 ml/m LA Vol (A4C):   70.7 ml 33.54 ml/m LA Biplane Vol: 80.8 ml 38.33 ml/m  AORTIC VALVE AV Area (Vmax):    3.69 cm AV Area (Vmean):   2.99 cm AV Area (VTI):     3.70 cm AV Vmax:           113.55 cm/s AV Vmean:          80.129 cm/s AV VTI:            0.248 m AV Peak Grad:      5.2 mmHg AV Mean Grad:      2.8 mmHg LVOT Vmax:         92.70 cm/s LVOT Vmean:        52.900 cm/s LVOT VTI:          0.203 m LVOT/AV VTI ratio: 0.82  AORTA Ao Root diam: 3.00 cm MITRAL VALVE MV Area (PHT): 3.17 cm              SHUNTS MV PHT:        69.31 msec            Systemic VTI:  0.20 m MV Decel Time: 239 msec              Systemic Diam: 2.40 cm MV E velocity: 65.60 cm/s  103 cm/s MV A velocity: 105.00 cm/s 70.3 cm/s MV E/A ratio:  0.62        1.5  Carlyle Dolly MD Electronically signed by Carlyle Dolly MD Signature Date/Time: 02/22/2019/3:33:29 PM    Final

## 2019-03-11 NOTE — Patient Instructions (Signed)
1. Colonoscopy as scheduled. See separate instructions.  2. Stay proactive to keep bowels moving. You can continue with prunes daily. If needed, you can add Over the counter Miralax one capful one to two times daily as needed. We want to keep bowels moving so you bowel prep is adequate at time of your colonoscopy.

## 2019-03-12 NOTE — Progress Notes (Signed)
CC'ED to Dr [name] and Dr [name]

## 2019-03-18 ENCOUNTER — Telehealth: Payer: Self-pay | Admitting: Gastroenterology

## 2019-03-18 NOTE — Telephone Encounter (Signed)
Please schedule patient on 1/4 at 8:30

## 2019-03-18 NOTE — Telephone Encounter (Signed)
Tried to call pt, spouse said he's not home at this time. She will have him call office.

## 2019-03-18 NOTE — Telephone Encounter (Signed)
Pt called office, offered to move up TCS to 03/25/19. He will check calendar when he gets home and call office back tomorrow.

## 2019-03-18 NOTE — Telephone Encounter (Signed)
Pt needs to have at least 5 days notice prior to moving up TCS because he will need to take extra Dulcolax for 5 days prior to procedure.  Routing to CM to see if TCS w/SLF can be sooner than April.

## 2019-03-18 NOTE — Telephone Encounter (Signed)
See addendum to my OV note.   Per SLF, please make sure patient is on cancellation list for colonosocpy.

## 2019-03-19 NOTE — Telephone Encounter (Signed)
Pt called office, TCS moved up to 03/25/19 at 8:30am. Pt had previous procedure instructions, new dates/times given on phone. He already has prep. COVID test rescheduled to 03/21/19 at 11:00am (date per pt request).

## 2019-03-19 NOTE — Telephone Encounter (Signed)
Pt called office, he read COVID test instructions and it says test should be done no more than 3 days prior to procedure. Called endo scheduler, they prefer COVID test be done 03/23/19 for procedure 03/25/19.  Called pt, COVID test rescheduled to 03/23/19 at 9:35am.

## 2019-03-21 ENCOUNTER — Other Ambulatory Visit (HOSPITAL_COMMUNITY): Payer: Medicare Other

## 2019-03-21 ENCOUNTER — Telehealth: Payer: Self-pay | Admitting: Gastroenterology

## 2019-03-21 LAB — BASIC METABOLIC PANEL
BUN/Creatinine Ratio: 16 (calc) (ref 6–22)
BUN: 20 mg/dL (ref 7–25)
CO2: 31 mmol/L (ref 20–32)
Calcium: 9.2 mg/dL (ref 8.6–10.3)
Chloride: 103 mmol/L (ref 98–110)
Creat: 1.22 mg/dL — ABNORMAL HIGH (ref 0.70–1.18)
Glucose, Bld: 95 mg/dL (ref 65–139)
Potassium: 4.9 mmol/L (ref 3.5–5.3)
Sodium: 139 mmol/L (ref 135–146)

## 2019-03-21 NOTE — Telephone Encounter (Signed)
Pt is scheduled with SF on 1/4 and has questions for MB. [phone]

## 2019-03-21 NOTE — Telephone Encounter (Signed)
Noted.  Agree with dulcolax instructions as per SLF. Continue with trilyte as planned.

## 2019-03-21 NOTE — Telephone Encounter (Signed)
Called pt, he started Dulcolax 10mg  yesterday as instructed prior to TCS scheduled for 03/25/19. Within 4 hours of taking Dulcolax he started having diarrhea. States within 1 1/2 hours he was "cleaned out". Hasn't taken Dulcolax today. He wants to know if he should have got that cleaned out with just one dose and if he should still take the Dulcolax.

## 2019-03-21 NOTE — Telephone Encounter (Signed)
PLEASE CALL PT. DULCOLAX WILL CLEAN HIM OUT. IF HE EATS REGULAR FOOD ON FRI. HE SHOULD TAKE ANOTHER ONE LATE SAT AFTERNOON.

## 2019-03-21 NOTE — Telephone Encounter (Signed)
Called and informed pt.  

## 2019-03-23 ENCOUNTER — Other Ambulatory Visit (HOSPITAL_COMMUNITY)
Admission: RE | Admit: 2019-03-23 | Discharge: 2019-03-23 | Disposition: A | Discharge status: Home / Self Care | Payer: Medicare Other | Source: Ambulatory Visit | Attending: Gastroenterology | Admitting: Gastroenterology

## 2019-03-23 DIAGNOSIS — Z01812 Encounter for preprocedural laboratory examination: Secondary | ICD-10-CM | POA: Insufficient documentation

## 2019-03-23 DIAGNOSIS — Z20822 Contact with and (suspected) exposure to covid-19: Secondary | ICD-10-CM | POA: Insufficient documentation

## 2019-03-23 LAB — SARS CORONAVIRUS 2 (TAT 6-24 HRS): SARS Coronavirus 2: NEGATIVE

## 2019-03-25 ENCOUNTER — Encounter (HOSPITAL_COMMUNITY): Payer: Self-pay | Admitting: Gastroenterology

## 2019-03-25 ENCOUNTER — Other Ambulatory Visit: Payer: Self-pay

## 2019-03-25 ENCOUNTER — Encounter (HOSPITAL_COMMUNITY)
Admission: RE | Disposition: A | Discharge status: Home / Self Care | Payer: Self-pay | Source: Home / Self Care | Attending: Gastroenterology

## 2019-03-25 ENCOUNTER — Ambulatory Visit (HOSPITAL_COMMUNITY)
Admission: RE | Admit: 2019-03-25 | Discharge: 2019-03-25 | Disposition: A | Discharge status: Home / Self Care | Payer: Medicare Other | Attending: Gastroenterology | Admitting: Gastroenterology

## 2019-03-25 DIAGNOSIS — D122 Benign neoplasm of ascending colon: Secondary | ICD-10-CM | POA: Diagnosis not present

## 2019-03-25 DIAGNOSIS — I110 Hypertensive heart disease with heart failure: Secondary | ICD-10-CM | POA: Diagnosis not present

## 2019-03-25 DIAGNOSIS — Z87891 Personal history of nicotine dependence: Secondary | ICD-10-CM | POA: Insufficient documentation

## 2019-03-25 DIAGNOSIS — D125 Benign neoplasm of sigmoid colon: Secondary | ICD-10-CM | POA: Insufficient documentation

## 2019-03-25 DIAGNOSIS — I5021 Acute systolic (congestive) heart failure: Secondary | ICD-10-CM | POA: Insufficient documentation

## 2019-03-25 DIAGNOSIS — K648 Other hemorrhoids: Secondary | ICD-10-CM | POA: Insufficient documentation

## 2019-03-25 DIAGNOSIS — K635 Polyp of colon: Secondary | ICD-10-CM

## 2019-03-25 DIAGNOSIS — J449 Chronic obstructive pulmonary disease, unspecified: Secondary | ICD-10-CM | POA: Insufficient documentation

## 2019-03-25 DIAGNOSIS — Z79899 Other long term (current) drug therapy: Secondary | ICD-10-CM | POA: Insufficient documentation

## 2019-03-25 DIAGNOSIS — I252 Old myocardial infarction: Secondary | ICD-10-CM | POA: Diagnosis not present

## 2019-03-25 DIAGNOSIS — Z7982 Long term (current) use of aspirin: Secondary | ICD-10-CM | POA: Insufficient documentation

## 2019-03-25 DIAGNOSIS — K644 Residual hemorrhoidal skin tags: Secondary | ICD-10-CM | POA: Insufficient documentation

## 2019-03-25 DIAGNOSIS — I480 Paroxysmal atrial fibrillation: Secondary | ICD-10-CM | POA: Insufficient documentation

## 2019-03-25 DIAGNOSIS — R195 Other fecal abnormalities: Secondary | ICD-10-CM

## 2019-03-25 DIAGNOSIS — D123 Benign neoplasm of transverse colon: Secondary | ICD-10-CM | POA: Insufficient documentation

## 2019-03-25 DIAGNOSIS — E785 Hyperlipidemia, unspecified: Secondary | ICD-10-CM | POA: Diagnosis not present

## 2019-03-25 HISTORY — PX: COLONOSCOPY: SHX5424

## 2019-03-25 HISTORY — PX: POLYPECTOMY: SHX5525

## 2019-03-25 SURGERY — COLONOSCOPY
Anesthesia: Moderate Sedation

## 2019-03-25 MED ORDER — SODIUM CHLORIDE 0.9 % IV SOLN: INTRAVENOUS | Status: DC

## 2019-03-25 MED ORDER — STERILE WATER FOR IRRIGATION IR SOLN
Status: DC | PRN
  Administered 2019-03-25: 1.5 mL

## 2019-03-25 MED ORDER — MIDAZOLAM HCL 5 MG/5ML IJ SOLN
INTRAMUSCULAR | Status: DC | PRN
  Administered 2019-03-25: 1 mg via INTRAVENOUS
  Administered 2019-03-25: 2 mg via INTRAVENOUS
  Administered 2019-03-25: 1 mg via INTRAVENOUS

## 2019-03-25 MED ORDER — MEPERIDINE HCL 100 MG/ML IJ SOLN
INTRAMUSCULAR | Status: DC | PRN
  Administered 2019-03-25 (×2): 25 mg via INTRAVENOUS

## 2019-03-25 MED ORDER — MEPERIDINE HCL 100 MG/ML IJ SOLN
INTRAMUSCULAR | Status: AC
  Filled 2019-03-25: qty 2

## 2019-03-25 MED ORDER — MIDAZOLAM HCL 5 MG/5ML IJ SOLN
INTRAMUSCULAR | Status: AC
  Filled 2019-03-25: qty 10

## 2019-03-25 NOTE — Op Note (Signed)
[facility] Patient Name: [name] [name] Procedure Date: 03/25/2019 8:36 AM MRN: [id number] Date of Birth: [birth year] Attending MD: Barney Drain MD, MD CSN: [id number] Age: 74 Admit Type: Outpatient Procedure:                Colonoscopy WITH COLD SNARE POLYPECTOMY Indications:              Positive Cologuard test Providers:                Barney Drain MD, MD, Otis Peak B. Sharon Seller, RN, Nelma Rothman, Technician Referring MD:             Jasper Loser. [name] MD, MD Medicines:                Meperidine 50 mg IV, Midazolam 4 mg IV Complications:            No immediate complications. Estimated Blood Loss:     Estimated blood loss was minimal. Procedure:                Pre-Anesthesia Assessment:                           - Prior to the procedure, a History and Physical                            was performed, and patient medications and                            allergies were reviewed. The patient's tolerance of                            previous anesthesia was also reviewed. The risks                            and benefits of the procedure and the sedation                            options and risks were discussed with the patient.                            All questions were answered, and informed consent                            was obtained. Prior Anticoagulants: The patient has                            taken no previous anticoagulant or antiplatelet                            agents except for aspirin. ASA Grade Assessment: II                            - A patient with mild systemic disease. After  reviewing the risks and benefits, the patient was                            deemed in satisfactory condition to undergo the                            procedure. After obtaining informed consent, the                            colonoscope was passed under direct vision.                            Throughout the procedure, the  patient's blood                            pressure, pulse, and oxygen saturations were                            monitored continuously. The [id number]                            scope was introduced through the anus and advanced                            to the the cecum, identified by appendiceal orifice                            and ileocecal valve. The colonoscopy was somewhat                            difficult due to restricted mobility of the colon                            and significant looping. Successful completion of                            the procedure was aided by increasing the dose of                            sedation medication, straightening and shortening                            the scope to obtain bowel loop reduction and                            COLOWRAP. The patient tolerated the procedure                            fairly well. The quality of the bowel preparation                            was good. The ileocecal valve, appendiceal orifice,  and rectum were photographed. Scope In: 8:52:40 AM Scope Out: 9:30:08 AM Scope Withdrawal Time: 0 hours 33 minutes 31 seconds  Total Procedure Duration: 0 hours 37 minutes 28 seconds  Findings:      Eight sessile polyps were found in the sigmoid colon(2:BTL 2), splenic       flexure, transverse colon(2), hepatic flexure(2) and ascending colon.       The polyps were 2 to 6 mm in size. These polyps were removed with a cold       snare. Resection and retrieval were complete.      Multiple small and large-mouthed diverticula were found in the       recto-sigmoid colon, sigmoid colon, descending colon and hepatic flexure.      External and internal hemorrhoids were found.      The recto-sigmoid colon, sigmoid colon and descending colon were       moderately tortuous. Impression:               - Eight 2 to 6 mm polyps in the sigmoid colon, at                            the  splenic flexure, in the transverse colon, at                            the hepatic flexure and in the ascending colon,                            removed with a cold snare. Resected and retrieved.                           - Diverticulosis in the recto-sigmoid colon, in the                            sigmoid colon, in the descending colon and at the                            hepatic flexure.                           - External and internal hemorrhoids.                           - Tortuous colon. Moderate Sedation:      Moderate (conscious) sedation was administered by the endoscopy nurse       and supervised by the endoscopist. The following parameters were       monitored: oxygen saturation, heart rate, blood pressure, and response       to care. Total physician intraservice time was 46 minutes. Recommendation:           - Patient has a contact number available for                            emergencies. The signs and symptoms of potential                            delayed complications were discussed with the  patient. Return to normal activities tomorrow.                            Written discharge instructions were provided to the                            patient.                           - High fiber diet.                           - Continue present medications.                           - Await pathology results.                           - NO NEED FOR STOOL TESTING. PT HAS HAD APPROPRIATE                            COLON CANCER SCREENING. Repeat colonoscopy is not                            recommended due to current age (42 years or older)                            for surveillance. IF HE REMAINS HEALTH COULD                            CONSIDER BENEFITS V. RISKS OF A COLONOSCOPY AT AGE                            83. Procedure Code(s):        --- Professional ---                           [phone], Colonoscopy, flexible; with removal of                             tumor(s), polyp(s), or other lesion(s) by snare                            technique                           99153, Moderate sedation; each additional 15                            minutes intraservice time                           99153, Moderate sedation; each additional 15                            minutes intraservice time  [id number], Moderate sedation services provided by the                            same physician or other qualified health care                            professional performing a gastrointestinal                            endoscopic service that sedation supports,                            requiring the presence of an independent trained                            observer to assist in the monitoring of the                            patient's level of consciousness and physiological                            status; initial 15 minutes of intra-service time;                            patient age 57 years or older (additional time may                            be reported with [phone], as appropriate) Diagnosis Code(s):        --- Professional ---                           K63.5, Polyp of colon                           K64.8, Other hemorrhoids                           R19.5, Other fecal abnormalities                           K57.30, Diverticulosis of large intestine without                            perforation or abscess without bleeding                           Q43.8, Other specified congenital malformations of                            intestine CPT copyright 2019 American [employer] Association. All rights reserved. The codes documented in this report are preliminary and upon coder review may  be revised to meet current compliance requirements. Barney Drain, MD Barney Drain MD, MD 03/25/2019 9:46:51 AM This report has been signed electronically. Number of Addenda: 0

## 2019-03-25 NOTE — H&P (Addendum)
Primary Care Physician:  Sinda Du, MD Primary Gastroenterologist:  Dr. Oneida Alar  Pre-Procedure History & Physical: HPI:  [name] [name] is a 74 y.o. male here for POSITIVE COLOGUARD TEST.  Past Medical History:  Diagnosis Date  . Acute systolic heart failure ([place])   . COPD (chronic obstructive pulmonary disease) ([name])   . Enlarged prostate with lower urinary tract symptoms (LUTS)   . Hyperlipidemia   . Hypertension   . Low serum testosterone level   . PAF (paroxysmal atrial fibrillation) ([place])   . Pneumonia 05/2014  . Pyothorax without fistula ([place])   . Sciatica   . Shortness of breath dyspnea   . Solitary pulmonary nodule   . STEMI (ST elevation myocardial infarction) ([place])    09/27/17 PCI/DES x1 mLAD, PTCA of the diag branch. EF 30% Lifevest at discharge.   . Testicular hypofunction   . Urinary frequency     Past Surgical History:  Procedure Laterality Date  . APPENDECTOMY    . BACK SURGERY    . COLONOSCOPY  2011   Tubular adenoma per patient  . COLONOSCOPY N/A 03/03/2015   Dr. Oneida Alar: Left colon is redundant, mild diverticulosis, small internal hemorrhoids, moderate external hemorrhoids.  Next colonoscopy in 5 years with overtube.  [name] N/A 09/27/2017   Procedure: Coronary/Graft Acute MI Revascularization;  Surgeon: Burnell Blanks, MD;  Location: [facility];  Service: Cardiovascular;  Laterality: N/A;  . EMPYEMA DRAINAGE Right 06/27/2014   Procedure: EMPYEMA DRAINAGE;  Surgeon: Grace Isaac, MD;  Location: [facility];  Service: Thoracic;  Laterality: Right;  . HEMORRHOID BANDING N/A 03/03/2015   Procedure: HEMORRHOID BANDING;  Surgeon: Danie Binder, MD;  [facility];  Service: Endoscopy;  Laterality: N/A;  Recomended to see a Psychologist, sport and exercise.  [name]    . INTRAVASCULAR ULTRASOUND/IVUS N/A 09/27/2017   Procedure: Intravascular Ultrasound/IVUS;  Surgeon: Burnell Blanks, MD;  Location: [facility];  Service: Cardiovascular;  Laterality: N/A;  . LEFT HEART CATH AND CORONARY ANGIOGRAPHY N/A 09/27/2017   Procedure: LEFT HEART CATH AND CORONARY ANGIOGRAPHY;  Surgeon: Burnell Blanks, MD;  Location: [facility];  Service: Cardiovascular;  Laterality: N/A;  . TEE WITHOUT CARDIOVERSION N/A 07/14/2014   Procedure: TRANSESOPHAGEAL ECHOCARDIOGRAM (TEE);  Surgeon: Sueanne Margarita, MD;  Location: [facility];  Service: Cardiovascular;  Laterality: N/A;  . VARICOSE VEIN SURGERY    . VIDEO ASSISTED THORACOSCOPY Right 06/27/2014   Procedure: VIDEO ASSISTED THORACOSCOPY;  Surgeon: Grace Isaac, MD;  Location: [facility];  Service: Thoracic;  Laterality: Right;  Marland Kitchen VIDEO BRONCHOSCOPY N/A 06/27/2014   Procedure: VIDEO BRONCHOSCOPY;  Surgeon: Grace Isaac, MD;  Location: [facility] OR;  Service: Thoracic;  Laterality: N/A;    Prior to Admission medications   Medication Sig Start Date End Date Taking? Authorizing Provider  aspirin EC 81 MG tablet Take 1 tablet (81 mg total) by mouth daily. 04/06/18  Yes BranchAlphonse Guild, MD  Calcium Citrate-Vitamin D (CALCIUM CITRATE + D3 PO) Take 1 tablet by mouth daily.   Yes [provider]  carvedilol (COREG) 6.25 MG tablet Take 1 tablet (6.25 mg total) by mouth 2 (two) times daily with a meal. 02/06/19  Yes Branch, Alphonse Guild, MD  ketoconazole (NIZORAL) 2 % cream Apply 1 application topically daily as needed for irritation.  02/26/19  Yes [provider]  [name] Oil 350 MG CAPS Take 350 mg by mouth daily.   Yes  [provider]  Lactobacillus (ACIDOPHILUS PROBIOTIC) 10 MG TABS Take 10 mg by mouth daily.    Yes [provider]  lisinopril (ZESTRIL) 5 MG tablet Take 1 tablet (5 mg total) by mouth daily. 03/04/19 06/02/19 Yes BranchAlphonse Guild, MD  Multiple Vitamins-Minerals (SENIOR VITES PO) Take 1 tablet by mouth daily.   Yes [provider]  polyethylene glycol-electrolytes (NULYTELY/GOLYTELY) 420 g  solution As directed 03/11/19  Yes Fields, Sandi L, MD  rosuvastatin (CRESTOR) 40 MG tablet Take 1 tablet (40 mg total) by mouth daily. 02/06/19  Yes BranchAlphonse Guild, MD  tamsulosin (FLOMAX) 0.4 MG CAPS capsule Take 0.4 mg by mouth daily.  05/30/14  Yes [provider]  tiotropium (SPIRIVA) 18 MCG inhalation capsule Place 1 capsule (18 mcg total) into inhaler and inhale daily. 10/05/17  Yes Cheryln Manly, NP  traZODone (DESYREL) 50 MG tablet Take 50 mg by mouth at bedtime as needed for sleep.   Yes [provider]  nitroGLYCERIN (NITROSTAT) 0.4 MG SL tablet Place 1 tablet (0.4 mg total) under the tongue every 5 (five) minutes as needed. 08/06/18   Arnoldo Lenis, MD    Allergies as of 03/11/2019  . (No Known Allergies)    Family History  Problem Relation Age of Onset  . Tuberculosis Father   . Hypertension Father   . COPD Mother   . Hypertension Mother   . Colon cancer Neg Hx     Social History   Socioeconomic History  . Marital status: Married    Spouse name: Not on file  . Number of children: Not on file  . Years of education: Not on file  . Highest education level: Not on file  Occupational History  . Not on file  Tobacco Use  . Smoking status: Former Smoker    Types: Pipe    Quit date: 06/07/2014    Years since quitting: 4.8  . Smokeless tobacco: Never Used  Substance and Sexual Activity  . Alcohol use: Yes    Alcohol/week: 0.0 standard drinks    Comment: Average 1-2 drinks of beer in the evening  . Drug use: No  . Sexual activity: Not on file  Other Topics Concern  . Not on file  Social History Narrative  . Not on file   Social Determinants of Health   Financial Resource Strain:   . Difficulty of Paying Living Expenses: Not on file  Food Insecurity:   . Worried About Charity fundraiser in the Last Year: Not on file  . Ran Out of Food in the Last Year: Not on file  Transportation Needs:   . Lack of Transportation (Medical): Not on  file  . Lack of Transportation (Non-Medical): Not on file  Physical Activity:   . Days of Exercise per Week: Not on file  . Minutes of Exercise per Session: Not on file  Stress:   . Feeling of Stress : Not on file  Social Connections:   . Frequency of Communication with Friends and Family: Not on file  . Frequency of Social Gatherings with Friends and Family: Not on file  . Attends Religious Services: Not on file  . Active Member of Clubs or Organizations: Not on file  . Attends Archivist Meetings: Not on file  . Marital Status: Not on file  Intimate Partner Violence:   . Fear of Current or Ex-Partner: Not on file  . Emotionally Abused: Not on file  . Physically Abused: Not  on file  . Sexually Abused: Not on file    Review of Systems: See HPI, otherwise negative ROS   Physical Exam: BP 133/77   Pulse 66   Temp 97.7 F (36.5 C) (Oral)   Resp 16   Ht 6' (1.829 m)   SpO2 97%   BMI 26.75 kg/m  General:   Alert,  pleasant and cooperative in NAD Head:  Normocephalic and atraumatic. Neck:  Supple; Lungs:  Clear throughout to auscultation.    Heart:  Regular rate and IRREGULAR rhythm. Abdomen:  Soft, nontender and nondistended. Normal bowel sounds, without guarding, and without rebound.   Neurologic:  Alert and  oriented x4;  grossly normal neurologically.  Impression/Plan:     POS COLOGUARD TEST.   PLAN: 1. TCS TODAY.  DISCUSSED PROCEDURE, BENEFITS, & RISKS: < 1% chance of medication reaction, bleeding, ASPIRATION, perforation, or rupture of spleen/liver.

## 2019-03-27 ENCOUNTER — Telehealth: Payer: Self-pay | Admitting: Cardiology

## 2019-03-27 LAB — SURGICAL PATHOLOGY

## 2019-03-27 NOTE — Telephone Encounter (Signed)
Pt called stating he's having some left shoulder pain and would like to speak w/ someone.

## 2019-03-27 NOTE — Telephone Encounter (Signed)
Patient reports left shoulder pain, onset last night. States it is not like the pain he experienced when he had his MI in 2019. He walks at 3.8 mph at 4 % incline for 55 minutes a few days a week. And he uses light weights. The pain actually got better after exercising.He has not used any heat/ice but has has some relief with tylenol. I suggested he avoid the weights for a few days and made him a phone visit with Dr.Branch on Friday 03/29/2019. He will call us back if sx's worsen or are totally relieved

## 2019-03-28 NOTE — Discharge Instructions (Signed)
You have small internal hemorrhoids and diverticulosis IN YOUR LEFT AND RIGHT COLON. YOU HAD EIGHT SMALL POLYPS REMOVED.    DRINK WATER TO KEEP YOUR URINE LIGHT YELLOW.  FOLLOW A HIGH FIBER DIET. AVOID ITEMS THAT CAUSE BLOATING. See info below.   USE PREPARATION H FOUR TIMES  A DAY IF NEEDED TO RELIEVE RECTAL PAIN/PRESSURE/BLEEDING.   YOUR BIOPSY RESULTS WILL BE BACK IN 5 BUSINESS DAYS.  You have had THE GOLD STANDARD FOR COLON CANCER SCREENING. YOU DO NOT NEED STOOL TESTING TO SCREEN FOR COLON CANCER. We do not routinely screen for polyps after the age of 77 BUT IF YOU REMAIN HEALTHY, YOU SHOULD SPEAK WITH YOUR GI DOC ABOUT THE BENEFITS V. RISKS OF HAVING A COLONOSCOPY AT AGE 74.  Colonoscopy Care After Read the instructions outlined below and refer to this sheet in the next week. These discharge instructions provide you with general information on caring for yourself after you leave the hospital. While your treatment has been planned according to the most current medical practices available, unavoidable complications occasionally occur. If you have any problems or questions after discharge, call DR. Hamza Empson, [phone].  ACTIVITY  You may resume your regular activity, but move at a slower pace for the next 24 hours.   Take frequent rest periods for the next 24 hours.   Walking will help get rid of the air and reduce the bloated feeling in your belly (abdomen).   No driving for 24 hours (because of the medicine (anesthesia) used during the test).   You may shower.   Do not sign any important legal documents or operate any machinery for 24 hours (because of the anesthesia used during the test).    NUTRITION  Drink plenty of fluids.   You may resume your normal diet as instructed by your doctor.   Begin with a light meal and progress to your normal diet. Heavy or fried foods are harder to digest and may make you feel sick to your stomach (nauseated).   Avoid alcoholic  beverages for 24 hours or as instructed.    MEDICATIONS  You may resume your normal medications.   WHAT YOU CAN EXPECT TODAY  Some feelings of bloating in the abdomen.   Passage of more gas than usual.   Spotting of blood in your stool or on the toilet paper  .  IF YOU HAD POLYPS REMOVED DURING THE COLONOSCOPY:  Eat a soft diet IF YOU HAVE NAUSEA, BLOATING, ABDOMINAL PAIN, OR VOMITING.    FINDING OUT THE RESULTS OF YOUR TEST Not all test results are available during your visit. DR. Oneida Alar WILL CALL YOU WITHIN 14 DAYS OF YOUR PROCEDUE WITH YOUR RESULTS. Do not assume everything is normal if you have not heard from DR. Bradleigh Sonnen, CALL HER OFFICE AT [phone].  SEEK IMMEDIATE MEDICAL ATTENTION AND CALL THE OFFICE: [phone] IF:  You have more than a spotting of blood in your stool.   Your belly is swollen (abdominal distention).   You are nauseated or vomiting.   You have a temperature over 101F.   You have abdominal pain or discomfort that is severe or gets worse throughout the day.  High-Fiber Diet A high-fiber diet changes your normal diet to include more whole grains, legumes, fruits, and vegetables. Changes in the diet involve replacing refined carbohydrates with unrefined foods. The calorie level of the diet is essentially unchanged. The Dietary Reference Intake (recommended amount) for adult males is 38 grams per day. For adult females, it  is 25 grams per day. Pregnant and lactating women should consume 28 grams of fiber per day. Fiber is the intact part of a plant that is not broken down during digestion. Functional fiber is fiber that has been isolated from the plant to provide a beneficial effect in the body.  PURPOSE Increase stool bulk.  Ease and regulate bowel movements.  Lower cholesterol.  REDUCE RISK OF COLON CANCER  INDICATIONS THAT YOU NEED MORE FIBER Constipation and hemorrhoids.  Uncomplicated diverticulosis (intestine condition) and irritable bowel  syndrome.  Weight management.  As a protective measure against hardening of the arteries (atherosclerosis), diabetes, and cancer.   GUIDELINES FOR INCREASING FIBER IN THE DIET Start adding fiber to the diet slowly. A gradual increase of about 5 more grams (2 servings of most fruits or vegetables) per day is best. Too rapid an increase in fiber may result in constipation, flatulence, and bloating.  Drink enough water and fluids to keep your urine clear or pale yellow. Water, juice, or caffeine-free drinks are recommended. Not drinking enough fluid may cause constipation.  Eat a variety of high-fiber foods rather than one type of fiber.  Try to increase your intake of fiber through using high-fiber foods rather than fiber pills or supplements that contain small amounts of fiber.  The goal is to change the types of food eaten. Do not supplement your present diet with high-fiber foods, but replace foods in your present diet.    Polyps, Colon  A polyp is extra tissue that grows inside your body. Colon polyps grow in the large intestine. The large intestine, also called the colon, is part of your digestive system. It is a long, hollow tube at the end of your digestive tract where your body makes and stores stool. Most polyps are not dangerous. They are benign. This means they are not cancerous. But over time, some types of polyps can turn into cancer. Polyps that are smaller than a pea are usually not harmful. But larger polyps could someday become or may already be cancerous. To be safe, doctors remove all polyps and test them.   PREVENTION There is not one sure way to prevent polyps. You might be able to lower your risk of getting them if you:  Eat more fruits and vegetables and less fatty food.   Do not smoke.   Avoid alcohol.   Exercise every day.   Lose weight if you are overweight.   Eating more calcium and folate can also lower your risk of getting polyps. Some foods that are rich in  calcium are milk, cheese, and broccoli. Some foods that are rich in folate are chickpeas, kidney beans, and spinach.    Diverticulosis Diverticulosis is a common condition that develops when small pouches (diverticula) form in the wall of the colon. The risk of diverticulosis increases with age. It happens more often in people who eat a low-fiber diet. Most individuals with diverticulosis have no symptoms. Those individuals with symptoms usually experience belly (abdominal) pain, constipation, or loose stools (diarrhea).  HOME CARE INSTRUCTIONS  Increase the amount of fiber in your diet as directed by your caregiver or dietician. This may reduce symptoms of diverticulosis.   Drink at least 6 to 8 glasses of water each day to prevent constipation.   Try not to strain when you have a bowel movement.   Avoiding nuts and seeds to prevent complications is NOT NECESSARY.   FOODS HAVING HIGH FIBER CONTENT INCLUDE:  Fruits. Apple, peach, pear, tangerine,  raisins, prunes.   Vegetables. Brussels sprouts, asparagus, broccoli, cabbage, carrot, cauliflower, romaine lettuce, spinach, summer squash, tomato, winter squash, zucchini.   Starchy Vegetables. Baked beans, kidney beans, lima beans, split peas, lentils, potatoes (with skin).    SEEK IMMEDIATE MEDICAL CARE IF:  You develop increasing pain or severe bloating.   You have an oral temperature above 101F.   You develop vomiting or bowel movements that are bloody or black.

## 2019-03-29 ENCOUNTER — Telehealth (INDEPENDENT_AMBULATORY_CARE_PROVIDER_SITE_OTHER): Payer: Medicare Other | Admitting: Cardiology

## 2019-03-29 ENCOUNTER — Encounter: Payer: Self-pay | Admitting: Cardiology

## 2019-03-29 VITALS — Ht 72.0 in | Wt 191.0 lb

## 2019-03-29 DIAGNOSIS — I5022 Chronic systolic (congestive) heart failure: Secondary | ICD-10-CM

## 2019-03-29 DIAGNOSIS — I2510 Atherosclerotic heart disease of native coronary artery without angina pectoris: Secondary | ICD-10-CM | POA: Diagnosis not present

## 2019-03-29 NOTE — Patient Instructions (Signed)
Medication Instructions:  Your physician recommends that you continue on your current medications as directed. Please refer to the Current Medication list given to you today.   Labwork: NONE   Testing/Procedures: NONE  Follow-Up: Your physician recommends that you schedule a follow-up appointment [id number] with Dr. Harl Bowie   Any Other Special Instructions Will Be Listed Below (If Applicable).     If you need a refill on your cardiac medications before your next appointment, please call your pharmacy.  Thank you for choosing [facility]!

## 2019-03-29 NOTE — Progress Notes (Signed)
Virtual Visit via Telephone Note   This visit type was conducted due to national recommendations for restrictions regarding the COVID-19 Pandemic (e.g. social distancing) in an effort to limit this patient's exposure and mitigate transmission in our community.  Due to his co-morbid illnesses, this patient is at least at moderate risk for complications without adequate follow up.  This format is felt to be most appropriate for this patient at this time.  The patient did not have access to video technology/had technical difficulties with video requiring transitioning to audio format only (telephone).  All issues noted in this document were discussed and addressed.  No physical exam could be performed with this format.  Please refer to the patient's chart for his  consent to telehealth for [facility].   Date:  03/29/2019   ID:  [name] [name], DOB [birth year], MRN [id number]  Patient Location: Home Provider Location: Home  PCP:  Sinda Du, MD  Cardiologist:  Carlyle Dolly, MD  Electrophysiologist:  None   Evaluation Performed:  Follow-Up Visit  Chief Complaint:  Follow up visit  History of Present Illness:    [name] [name] is a 74 y.o. male seen today for follow up of the following medical problems.This is a focused visit on history of CAD and ischemic cardiopmyoapathy.    1. CAD/ICM/Chronic systolic HF - admit [id number] with STEMI. Received DES toLAD, PTCA of diag - 09/2017 echo LVEF 30-35%. He was discharged with lifevest for primary prevention - repeat 10/2017 35-40%, lifevest discontinued - given afib was started on plavix, asa, and anticoag x 1 month, then plavix and anticoag.  - medical therapy initially limited due to soft bp's and prior renal dysfunction.   02/2019 echo: LVEF [id number], grade I diasotlic dysfunction - no recent symptoms.   - had some left shoulder pain. Worst with movement, aching like pain. Steady pain, lasted several pain.     The  patient does not have symptoms concerning for COVID-19 infection (fever, chills, cough, or new shortness of breath).    Past Medical History:  Diagnosis Date  . Acute systolic heart failure ([place])   . COPD (chronic obstructive pulmonary disease) ([place])   . Enlarged prostate with lower urinary tract symptoms (LUTS)   . Hyperlipidemia   . Hypertension   . Low serum testosterone level   . PAF (paroxysmal atrial fibrillation) ([place])   . Pneumonia 05/2014  . Pyothorax without fistula ([place])   . Sciatica   . Shortness of breath dyspnea   . Solitary pulmonary nodule   . STEMI (ST elevation myocardial infarction) ([place])    09/27/17 PCI/DES x1 mLAD, PTCA of the diag [name] [name]. EF 30% Lifevest at discharge.   . Testicular hypofunction   . Urinary frequency    Past Surgical History:  Procedure Laterality Date  . APPENDECTOMY    . BACK SURGERY    . COLONOSCOPY  2011   Tubular adenoma per patient  . COLONOSCOPY N/A 03/03/2015   Dr. Oneida Alar: Left colon is redundant, mild diverticulosis, small internal hemorrhoids, moderate external hemorrhoids.  Next colonoscopy in 5 years with overtube.  . COLONOSCOPY N/A 03/25/2019   Procedure: COLONOSCOPY;  Surgeon: Danie Binder, MD;  [facility];  Service: Endoscopy;  Laterality: N/A;  8:30am - Camille's request  . CORONARY/GRAFT ACUTE MI REVASCULARIZATION N/A 09/27/2017   Procedure: Coronary/Graft Acute MI Revascularization;  Surgeon: Burnell Blanks, MD;  Location: [place] [facility];  Service: Cardiovascular;  Laterality: N/A;  . EMPYEMA DRAINAGE Right  06/27/2014   Procedure: EMPYEMA DRAINAGE;  Surgeon: Grace Isaac, MD;  Location: [place];  Service: Thoracic;  Laterality: Right;  . HEMORRHOID BANDING N/A 03/03/2015   Procedure: HEMORRHOID BANDING;  Surgeon: Danie Binder, MD;  [facility];  Service: Endoscopy;  Laterality: N/A;  Recomended to see a Psychologist, sport and exercise.  [name]    . INTRAVASCULAR ULTRASOUND/IVUS N/A 09/27/2017    Procedure: Intravascular Ultrasound/IVUS;  Surgeon: Burnell Blanks, MD;  Location: [place] [facility];  Service: Cardiovascular;  Laterality: N/A;  . LEFT HEART CATH AND CORONARY ANGIOGRAPHY N/A 09/27/2017   Procedure: LEFT HEART CATH AND CORONARY ANGIOGRAPHY;  Surgeon: Burnell Blanks, MD;  Location: [place] [facility];  Service: Cardiovascular;  Laterality: N/A;  . POLYPECTOMY  03/25/2019   Procedure: POLYPECTOMY;  Surgeon: Danie Binder, MD;  [facility];  Service: Endoscopy;;  . TEE WITHOUT CARDIOVERSION N/A 07/14/2014   Procedure: TRANSESOPHAGEAL ECHOCARDIOGRAM (TEE);  Surgeon: Sueanne Margarita, MD;  Location[facility];  Service: Cardiovascular;  Laterality: N/A;  . VARICOSE VEIN SURGERY    . VIDEO ASSISTED THORACOSCOPY Right 06/27/2014   Procedure: VIDEO ASSISTED THORACOSCOPY;  Surgeon: Grace Isaac, MD;  Location: [place];  Service: Thoracic;  Laterality: Right;  Marland Kitchen VIDEO BRONCHOSCOPY N/A 06/27/2014   Procedure: VIDEO BRONCHOSCOPY;  Surgeon: Grace Isaac, MD;  Location[facility] OR;  Service: Thoracic;  Laterality: N/A;     No outpatient medications have been marked as taking for the 03/29/19 encounter (Appointment) with Arnoldo Lenis, MD.     Allergies:   Patient has no known allergies.   Social History   Tobacco Use  . Smoking status: Former Smoker    Types: Pipe    Quit date: 06/07/2014    Years since quitting: 4.8  . Smokeless tobacco: Never Used  Substance Use Topics  . Alcohol use: Yes    Alcohol/week: 0.0 standard drinks    Comment: Average 1-2 drinks of beer in the evening  . Drug use: No     Family Hx: The patient's family history includes COPD in his mother; Hypertension in his father and mother; Tuberculosis in his father. There is no history of Colon cancer.  ROS:   Please see the history of present illness.     All other systems reviewed and are negative.   Prior CV studies:   The following studies were reviewed  today:  09/2017 cath  Prox RCA lesion is 30% stenosed.  Prox LAD lesion is 100% stenosed.  Prox Cx lesion is 20% stenosed.  A drug-eluting stent was successfully placed using a STENT SYNERGY DES 3X16.  Post intervention, there is a 0% residual stenosis.  Ost 1st Diag lesion is 100% stenosed.  Balloon angioplasty was performed using a BALLOON SAPPHIRE 2.5X12.  Post intervention, there is a 40% residual stenosis.  Prox LAD to Mid LAD lesion is 40% stenosed.  1. Acute anterior STEMI secondary to occlusion of the mid LAD 2. Successful PTCA/DES x 1 mid LAD 3. Successful PTCA/angioplasty ostium of the Diagonal [place].  4. Mild non-obstructive disease in the RCA, circumflex and mid LAD  Post cathRecommendations:  Will admit to the ICU and continue Aggrastat for 6 hours. Will start high intensity statin and a beta blocker. Echo later today.  . Recommend uninterrupted dual antiplatelet therapy with Aspirin 81mg  daily and Ticagrelor 90mg  twice dailyfor a minimum of 12 months (ACS - Class I recommendation).   09/2017 echo Study Conclusions  -  Left ventricle: The cavity size was normal. There was mild focal basal hypertrophy of the septum. Systolic function was moderately to severely reduced. The estimated ejection fraction was in the range of 30% to 35%. Severe hypokinesis of the mid to apical anteroseptal, anterior, and anterolateral myocardium. Dyskinetic at apex. Doppler parameters are consistent with abnormal left ventricular relaxation (grade 1 diastolic dysfunction). - Aortic valve: Trileaflet; mildly thickened, mildly calcified leaflets. - Mitral valve: Calcified annulus. There was trivial regurgitation. - Pulmonary arteries: PA peak pressure: 41 mm Hg (S). - Pericardium, extracardiac: A small pericardial effusion was identified.  Impressions:  - New reduction in LV EF with regional wall motion abnormalities in the mid to apical anteroseptum,  anterior, and anterolateral walls with dyskinesis at the apex. Small pericardial effusion noted.  10/2017 echo Study Conclusions  - Left ventricle: The cavity size was normal. Wall thickness was increased in a pattern of moderate LVH. Systolic function was moderately reduced. The estimated ejection fraction was in the range of 35% to 40%. Doppler parameters are consistent with abnormal left ventricular relaxation (grade 1 diastolic dysfunction). Doppler parameters are consistent with high ventricular filling pressure. - Regional wall motion abnormality: Hypokinesis of the apical septal, apical lateral, and apical myocardium. - Aortic valve: Mildly calcified annulus. Trileaflet; mildly thickened leaflets. Valve area (VTI): 4.91 cm^2. Valve area (Vmax): 4.27 cm^2. Valve area (Vmean): 3.54 cm^2. - Mitral valve: Mildly calcified annulus. Mildly thickened leaflets . - Left atrium: The atrium was moderately dilated. - Pericardium, extracardiac: There is a small circumferential pericardial effusion. The effusion measures 0.8 cm adjacent to the LV. There is no evidence of tamponade physiology. - Technically adequate study.   02/2018 heart monitor  21 day event monitor  Min HR 48, Max HR 130, Avg HR 71. Min HR occurred in early AM hours presumably while sleeping  No symptoms reported  4 beat run of NSVT that was asymptomatic   02/2019 echo  IMPRESSIONS    1. Left ventricular ejection fraction, by visual estimation, is 40 to 45%. The left ventricle has mildly decreased function. There is moderately increased left ventricular hypertrophy.  2. Elevated left atrial pressure.  3. Left ventricular diastolic parameters are consistent with Grade I diastolic dysfunction (impaired relaxation).  4. Hypokinesis of the apex, mid to distal anteroseptal, apical lateral, and apical myocardium.  5. Global right ventricle has normal systolic function.The right  ventricular size is normal. No increase in right ventricular wall thickness.  6. Left atrial size was moderately dilated.  7. Right atrial size was normal.  8. The mitral valve is normal in structure. Mild mitral valve regurgitation. No evidence of mitral stenosis.  9. The tricuspid valve is normal in structure. Tricuspid valve regurgitation is not demonstrated. 10. The aortic valve is tricuspid. Aortic valve regurgitation is not visualized. No evidence of aortic valve sclerosis or stenosis. 11. The pulmonic valve was not well visualized. Pulmonic valve regurgitation is not visualized. 12. The inferior vena cava is normal in size with greater than 50% respiratory variability, suggesting right atrial pressure of 3 mmHg. Labs/Other Tests and Data Reviewed:    EKG:  No ECG reviewed.  Recent Labs: 10/30/2018: ALT 22; Hemoglobin 14.1; Platelets 209; TSH 2.14 03/21/2019: BUN 20; Creat 1.22; Potassium 4.9; Sodium 139   Recent Lipid Panel Lab Results  Component Value Date/Time   CHOL 121 10/30/2018 12:00 AM   TRIG 109 10/30/2018 12:00 AM   HDL 39 10/30/2018 12:00 AM   LDLCALC 63 10/30/2018 12:00 AM  Wt Readings from Last 3 Encounters:  03/11/19 197 lb 3.2 oz (89.4 kg)  02/06/19 195 lb (88.5 kg)  07/30/18 191 lb (86.6 kg)     Objective:    Vital Signs:   Today's Vitals   03/29/19 0838  Weight: 191 lb (86.6 kg)  Height: 6' (1.829 m)   Body mass index is 25.9 kg/m. Normal affect. Normal speech pattern and tone. Comfortable, no apparent distress. No audible signs of SOB or wheezing.   ASSESSMENT & PLAN:    1. CAD/ICM/chronic systolic HF - medical therapy limited by soft bp's and prior renal dysfunction though over time renal function has improved - recent left shoulder pain not consistent with cardiac chest pain, most consistent with MSK related pain. No plans for cardiac testing - continue current medical therapy.     Time:   Today, I have spent 15 minutes with the patient  with telehealth technology discussing the above problems.     Medication Adjustments/Labs and Tests Ordered: Current medicines are reviewed at length with the patient today.  Concerns regarding medicines are outlined above.   Tests Ordered: No orders of the defined types were placed in this encounter.   Medication Changes: No orders of the defined types were placed in this encounter.   Follow Up:  Either In Person or Virtual in 4 month(s)  Signed, Carlyle Dolly, MD  03/29/2019 8:04 AM    [place]

## 2019-04-02 ENCOUNTER — Encounter: Payer: Self-pay | Admitting: Family Medicine

## 2019-04-02 ENCOUNTER — Other Ambulatory Visit: Payer: Self-pay

## 2019-04-02 ENCOUNTER — Ambulatory Visit (INDEPENDENT_AMBULATORY_CARE_PROVIDER_SITE_OTHER): Payer: Medicare Other | Admitting: Family Medicine

## 2019-04-02 VITALS — BP 140/75 | HR 71 | Temp 97.6°F | Ht 72.0 in | Wt 196.2 lb

## 2019-04-02 DIAGNOSIS — J449 Chronic obstructive pulmonary disease, unspecified: Secondary | ICD-10-CM | POA: Diagnosis not present

## 2019-04-02 DIAGNOSIS — I10 Essential (primary) hypertension: Secondary | ICD-10-CM

## 2019-04-02 DIAGNOSIS — G4700 Insomnia, unspecified: Secondary | ICD-10-CM | POA: Diagnosis not present

## 2019-04-02 DIAGNOSIS — E785 Hyperlipidemia, unspecified: Secondary | ICD-10-CM | POA: Diagnosis not present

## 2019-04-02 NOTE — Progress Notes (Signed)
New Patient Office Visit  Subjective:  Patient ID: [name] [name], male    DOB: 07/09/1945  Age: 74 y.o. MRN: [id number] [facility] Date ID   Written Drug Qty Days Prescriber Rx # Pharmacy Refill   Daily Dose* Pymt Type PMP    01/13/2019  1   01/13/2019  Alprazolam 0.25 MG Tablet  10.00  10 Ed Haw   [id number]   [facility] (0327)   0  0.50 LME  Comm Ins   [place]  11/25/2018  1   06/05/2018  Alprazolam 0.25 MG Tablet  10.00  10 Ed Haw   [id number]   Wal (0327)   1  0.50 LME  Comm Ins   [place]  06/05/2018  1   06/05/2018  Alprazolam 0.25 MG Tablet  10.00  10 Ed Haw   [id number]   Wal (0327)   0  0.50 LME  Comm Ins   [place]  10/14/2017  1   07/11/2017  Alprazolam 0.25 MG Tablet  10.00  10 Ed Haw   [id number]   Wal (0327)   0  0.50 LME  Comm Ins   [place]  08/29/2017  1   08/29/2017  Testosterone 50 Mg/5 Gram Pkt  150.00  30 Ed Haw   [id number]   [facility] (0327)   0   Comm Ins   [place]  08/03/2017  1   08/03/2017  Hydrocodone-Acetamin 10-325 MG  40.00  6 Da Wille Glaser   [id number]   Wal (0327)   0  66.67 MME  Comm Ins   [place]  07/30/2017  1   05/09/2017  Testosterone 50 Mg/5 Gram Pkt  150.00  30 Ed Haw   [id number]   [facility] (0327)   3   Comm Ins   [place]  07/11/2017  1   07/11/2017  Alprazolam 0.25 MG Tablet  10.00  10 Ed Haw   [id number]   Wal (0327)   0  0.50 LME  Comm Ins   [place]  07/05/2017  1   05/09/2017  Testosterone 50 Mg/5 Gram Pkt  150.00  30 Ed Haw   [id number]   Wal (0327)   2   Comm Ins   [place]  06/05/2017  1   05/09/2017  Testosterone 50 Mg/5 Gram Pkt  150.00  30 Ed Haw   [id number]   Wal (0327)   1   Comm Ins   [place]  05/09/2017  2   05/09/2017  Testosterone 50 Mg/5 Gram Pkt  150.00  30 Ed Haw   [id number]   Wal (0327)   0   Comm Ins   [place]  04/10/2017  1   03/13/2017  Testosterone 50 Mg/5 Gram Gel  150.00  30 Ed Haw   [id number]   Wal (0327)   1   Medicare   [place]  *Per CDC guidance, the MME conversion factors prescribed or provided as part of the medication-assisted treatment for opioid use disorder should not be used to benchmark against dosage thresholds   CC:  Chief Complaint  Patient presents with   . Establish Care    Need to be referred to a Lung specialist.  chronic cough-saw Dr. Bonnye Fava referral to pulmonary REVIEW OF SPECIALIST NOTES HPI [name] [name] presents for COPD-spiriva, damage to lung in 2016-d/w Dr. [name] before retirement-needs referral to pulmonary per GI note In 2016 he developed pneumonia complicated by empyema.  Required surgical intervention and underwent drainage and decortication of empyema.  Tissue grew microaerophilic strep.  Treated  with Zosyn and then Augmentin.  He continued with fevers and persistent leukocytosis.  Seen by infectious disease and there was question of possible endocarditis. Transesophageal echocardiogram April 2016 with thickening of the PED with possible mobile mass on the pulmonary artery side of the valve leaflet.  Valve is not well visualized and there is no PR.  Cannot rule out vegetation based on the study.  Consensus with ID and cardiovascular was that it was not likely endocarditis. Cardio note CAD/ICM/Chronic systolic HF - admit [id number] with STEMI. Received DES toLAD, PTCA of diag - 09/2017 echo LVEF 30-35%. He was discharged with lifevest for primary prevention - repeat 10/2017 35-40%, lifevest discontinued - given afib was started on plavix, asa, and anticoag x 1 month, then plavix and anticoag. echo 12/20 Left Ventricle: Left ventricular ejection fraction, by visual estimation, is 40 to 45%. The left ventricle has mildly decreased function. There is moderately increased left ventricular hypertrophy. Left ventricular diastolic parameters are consistent with Grade I diastolic dysfunction (impaired relaxation). Elevated left atrial pressure. Hypokinesis of the apex, mid to distal anteroseptal, apical lateral, and apical myocardium.Right Ventricle: The right ventricular size is normal. No increase in right ventricular wall thickness. Global RV systolic function is has normal systolic function. Left Atrium: Left atrial size was  moderately dilated.Right Atrium: Right atrial size was normal in size Pericardium: There is no evidence of pericardial effusion. Mitral Valve: The mitral valve is normal in structure. No evidence of mitral valve stenosis by observation. Mild mitral valve regurgitation. Tricuspid Valve: The tricuspid valve is normal in structure. Tricuspid valve regurgitation is not demonstrated. Aortic Valve: The aortic valve is tricuspid. Aortic valve regurgitation is not visualized. The aortic valve is structurally normal, with no evidence of sclerosis or stenosis. Aortic valve mean gradient measures 2.8 mmHg. Aortic valve peak gradient measures 5.2 mmHg. Aortic valve area, by VTI measures 3.70 cm. Pulmonic Valve: The pulmonic valve was not well visualized. Pulmonic valve regurgitation is not visualized. No evidence of pulmonic stenosis. Aorta: The aortic root is normal in size and structure. Pulmonary Artery: Indeterminate PASP, inadequate TR jet [id number].venous: The inferior vena cava is normal in size with greater than 50% respiratory variability, suggesting right atrial pressure of 3 mmHg. IAS/Shunts: No atrial level shunt detected by color flow Doppler.  BPH urinary frequency/nocturia-taking flomax  GI noted Cologuard for Dr. [name] was positive. He has intermittent brbpr which he feels is from hemorrhoids. He manages his constipation by eating prunes every day. No melena. No abdominal pain. No UGI symptoms. dominal pain. No UGI symptoms. Last colonoscopy 02/2015, mild diverticulosis, external/internal hemorrhoids, redundant left colon. Advised next colonoscopy in 5 years (h/o adenomas) with overtube. Patient did not have hemorrhoid surgery after last colonoscopy as recommended. States he is not bothered by hemorrhoids, no rectal pain and limited bleeding. He reports surgery before and hemorrhoids came back. Findings:colonoscopy      Eight sessile polyps were found in the sigmoid colon(2:BTL 2), splenic        flexure, transverse colon(2), hepatic flexure(2) and ascending colon.       The polyps were 2 to 6 mm in size. These polyps were removed with a cold       snare. Resection and retrieval were complete.      Multiple small and large-mouthed diverticula were found in the       recto-sigmoid colon, sigmoid colon, descending colon and hepatic flexure.      External and internal hemorrhoids were found.  The recto-sigmoid colon, sigmoid colon and descending colon were       moderately tortuous. Impression:               - Eight 2 to 6 mm polyps in the sigmoid colon, at                            the splenic flexure, in the transverse colon, at                            the hepatic flexure and in the ascending colon,                            removed with a cold snare. Resected and retrieved.                           - Diverticulosis in the recto-sigmoid colon, in the                            sigmoid colon, in the descending colon and at the                            hepatic flexure.                           - External and internal hemorrhoids.                           - Tortuous colon. Path report COLON, ASCENDING, HEPATIC FLEXURE, TRANSVERSE, SPLENIC FLEXURE,  POLYPECTOMY:  - Tubular adenoma (s).  - No high-grade dysplasia or malignancy.   B. COLON, SIGMOID, POLYPECTOMY:  - Hyperplastic polyp (s).  - No adenomatous change or carcinoma.   EYE SPECIALIST Vitreous degeneration, bilateral   Regular astigmatism, bilateral   Hypermetropia, right eye   Combined forms of age-related cataract, bilateral   Presbyopia   Past Medical History:  Diagnosis Date  . Acute systolic heart failure ([place])   . COPD (chronic obstructive pulmonary disease) ([place])   . Enlarged prostate with lower urinary tract symptoms (LUTS)   . Hyperlipidemia   . Hypertension   . Low serum testosterone level   . PAF (paroxysmal atrial fibrillation) ([place])   . Pneumonia 05/2014  . Pyothorax without  fistula ([place])   . Sciatica   . Shortness of breath dyspnea   . Solitary pulmonary nodule   . STEMI (ST elevation myocardial infarction) ([place])    09/27/17 PCI/DES x1 mLAD, PTCA of the diag branch. EF 30% Lifevest at discharge.   . Testicular hypofunction   . Urinary frequency     Past Surgical History:  Procedure Laterality Date  . APPENDECTOMY    . BACK SURGERY    . COLONOSCOPY  2011   Tubular adenoma per patient  . COLONOSCOPY N/A 03/03/2015   Dr. Oneida Alar: Left colon is redundant, mild diverticulosis, small internal hemorrhoids, moderate external hemorrhoids.  Next colonoscopy in 5 years with overtube.  . COLONOSCOPY N/A 03/25/2019   Procedure: COLONOSCOPY;  Surgeon: Danie Binder, MD;  [facility];  Service: Endoscopy;  Laterality: N/A;  8:30am - Camille's request  . CORONARY/GRAFT ACUTE MI REVASCULARIZATION N/A  09/27/2017   Procedure: Coronary/Graft Acute MI Revascularization;  Surgeon: Burnell Blanks, MD;  Location: [place] [facility];  Service: Cardiovascular;  Laterality: N/A;  . EMPYEMA DRAINAGE Right 06/27/2014   Procedure: EMPYEMA DRAINAGE;  Surgeon: Grace Isaac, MD;  Location: [place];  Service: Thoracic;  Laterality: Right;  . HEMORRHOID BANDING N/A 03/03/2015   Procedure: HEMORRHOID BANDING;  Surgeon: Danie Binder, MD;  [facility];  Service: Endoscopy;  Laterality: N/A;  Recomended to see a Psychologist, sport and exercise.  [name]    . INTRAVASCULAR ULTRASOUND/IVUS N/A 09/27/2017   Procedure: Intravascular Ultrasound/IVUS;  Surgeon: Burnell Blanks, MD;  Location: [place] [facility];  Service: Cardiovascular;  Laterality: N/A;  . LEFT HEART CATH AND CORONARY ANGIOGRAPHY N/A 09/27/2017   Procedure: LEFT HEART CATH AND CORONARY ANGIOGRAPHY;  Surgeon: Burnell Blanks, MD;  Location: [place] [facility];  Service: Cardiovascular;  Laterality: N/A;  . LUNG SURGERY  2016  . POLYPECTOMY  03/25/2019   Procedure: POLYPECTOMY;  Surgeon: Danie Binder, MD;  [facility];  Service: Endoscopy;;  . TEE WITHOUT CARDIOVERSION N/A 07/14/2014   Procedure: TRANSESOPHAGEAL ECHOCARDIOGRAM (TEE);  Surgeon: Sueanne Margarita, MD;  Location: [facility] ENDOSCOPY;  Service: Cardiovascular;  Laterality: N/A;  . VARICOSE VEIN SURGERY    . VIDEO ASSISTED THORACOSCOPY Right 06/27/2014   Procedure: VIDEO ASSISTED THORACOSCOPY;  Surgeon: Grace Isaac, MD;  Location: [place];  Service: Thoracic;  Laterality: Right;  Marland Kitchen VIDEO BRONCHOSCOPY N/A 06/27/2014   Procedure: VIDEO BRONCHOSCOPY;  Surgeon: Grace Isaac, MD;  Location: [facility] OR;  Service: Thoracic;  Laterality: N/A;    Family History  Problem Relation Age of Onset  . Tuberculosis Father   . Hypertension Father   . COPD Mother   . Hypertension Mother   . Colon cancer Neg Hx     Social History   Socioeconomic History  . Marital status: Married    Spouse name: Not on file  . Number of children: Not on file  . Years of education: Not on file  . Highest education level: Not on file  Occupational History  . Not on file  Tobacco Use  . Smoking status: Former Smoker    Types: Pipe    Quit date: 06/07/2014    Years since quitting: 4.8  . Smokeless tobacco: Never Used  Substance and Sexual Activity  . Alcohol use: Yes    Comment: Average 1-2 drinks of beer in the evening  . Drug use: No  . Sexual activity: Not on file  Other Topics Concern  . Not on file  Social History Narrative  . Not on file   Social Determinants of Health   Financial Resource Strain:   . Difficulty of Paying Living Expenses: Not on file  Food Insecurity:   . Worried About Charity fundraiser in the Last Year: Not on file  . Ran Out of Food in the Last Year: Not on file  Transportation Needs:   . Lack of Transportation (Medical): Not on file  . Lack of Transportation (Non-Medical): Not on file  Physical Activity:   . Days of Exercise per Week: Not on file  . Minutes of Exercise per Session: Not on file  Stress:    . Feeling of Stress : Not on file  Social Connections:   . Frequency of Communication with Friends and Family: Not on file  . Frequency of Social Gatherings with Friends and Family: Not on file  .  Attends Religious Services: Not on file  . Active Member of Clubs or Organizations: Not on file  . Attends Archivist Meetings: Not on file  . Marital Status: Not on file  Intimate Partner Violence:   . Fear of Current or Ex-Partner: Not on file  . Emotionally Abused: Not on file  . Physically Abused: Not on file  . Sexually Abused: Not on file      ROS Review of Systems  Constitutional: Negative for fatigue and unexpected weight change.  HENT: Positive for hearing loss.        Wears hearing aids  Eyes:       Macular degneration/glaucoma  Respiratory:       COPD-chronic cough  Endocrine: Positive for cold intolerance.  Genitourinary: Positive for frequency.       BPH  Musculoskeletal: Positive for arthralgias. Negative for back pain, gait problem, myalgias and neck pain.  Allergic/Immunologic: Negative for food allergies.  Hematological: Negative.   Psychiatric/Behavioral: Negative.     Objective:   Today's Vitals: BP 140/75 (BP Location: Left Arm, Patient Position: Sitting, Cuff Size: Normal)   Pulse 71   Temp 97.6 F (36.4 C) (Temporal)   Ht 6' (1.829 m)   Wt 196 lb 3.2 oz (89 kg)   SpO2 97%   BMI 26.61 kg/m   Physical Exam Constitutional:      Appearance: Normal appearance.  HENT:     Head: Normocephalic and atraumatic.  Eyes:     Conjunctiva/sclera: Conjunctivae normal.  Cardiovascular:     Rate and Rhythm: Normal rate and regular rhythm.     Pulses: Normal pulses.     Heart sounds: Normal heart sounds.  Pulmonary:     Effort: Pulmonary effort is normal.     Breath sounds: Normal breath sounds.  Musculoskeletal:     Cervical back: Normal range of motion.  Neurological:     Mental Status: He is alert and oriented to person, place, and time.   Psychiatric:        Mood and Affect: Mood normal.        Behavior: Behavior normal.     Assessment & Plan:  1. Essential hypertension Lisinopril/coreg-followed by cardio-recent echo-BMP stable No recent use of nitro 2. Chronic obstructive pulmonary disease, unspecified COPD type ([place]) spiriva-stable currently - Ambulatory referral to Pulmonology  3. Insomnia, unspecified type Trazodone-works well per pt-d/w pt will not rx-Xanax-controlled substance not recommended   4. Hyperlipidemia, unspecified hyperlipidemia type crestor-8/20lipid panel normal, lft normal, takes Krill oil otc  Outpatient Encounter Medications as of 04/02/2019  Medication Sig  . aspirin EC 81 MG tablet Take 1 tablet (81 mg total) by mouth daily.  . Calcium Citrate-Vitamin D (CALCIUM CITRATE + D3 PO) Take 1 tablet by mouth daily.  . carvedilol (COREG) 6.25 MG tablet Take 1 tablet (6.25 mg total) by mouth 2 (two) times daily with a meal.  . Krill Oil 350 MG CAPS Take 350 mg by mouth daily.  . Lactobacillus (ACIDOPHILUS PROBIOTIC) 10 MG TABS Take 10 mg by mouth daily.   Marland Kitchen lisinopril (ZESTRIL) 5 MG tablet Take 1 tablet (5 mg total) by mouth daily.  . Multiple Vitamins-Minerals (SENIOR VITES PO) Take 1 tablet by mouth daily.  . nitroGLYCERIN (NITROSTAT) 0.4 MG SL tablet Place 1 tablet (0.4 mg total) under the tongue every 5 (five) minutes as needed.  . rosuvastatin (CRESTOR) 40 MG tablet Take 1 tablet (40 mg total) by mouth daily.  . tamsulosin (FLOMAX) 0.4 MG CAPS  capsule Take 0.4 mg by mouth daily.   Marland Kitchen tiotropium (SPIRIVA) 18 MCG inhalation capsule Place 1 capsule (18 mcg total) into inhaler and inhale daily.  . traZODone (DESYREL) 50 MG tablet Take 50 mg by mouth at bedtime as needed for sleep.  . [DISCONTINUED] ketoconazole (NIZORAL) 2 % cream Apply 1 application topically daily as needed for irritation.    No facility-administered encounter medications on file as of 04/02/2019.    Follow-up:  6 months-fasting  blood work prior  [employer] Hannah Beat, MD

## 2019-04-02 NOTE — Patient Instructions (Addendum)
Pulmonary referral  pt can have labwork -fasting completed prior to follow up If you have lab work done today you will be contacted with your lab results within the next 2 weeks.  If you have not heard from [place] then please contact us. The fastest way to get your results is to register for My Chart.   IF you received an x-ray today, you will receive an invoice from [facility]. Please contact [facility] at [phone] with questions or concerns regarding your invoice.   IF you received labwork today, you will receive an invoice from [facility]. Please contact [facility] at [phone] with questions or concerns regarding your invoice.   Our billing staff will not be able to assist you with questions regarding bills from these companies.  You will be contacted with the lab results as soon as they are available. The fastest way to get your results is to activate your My Chart account. Instructions are located on the last page of this paperwork. If you have not heard from [place] regarding the results in 2 weeks, please contact this office.

## 2019-04-13 ENCOUNTER — Encounter: Payer: Self-pay | Admitting: Family Medicine

## 2019-05-07 DIAGNOSIS — B351 Tinea unguium: Secondary | ICD-10-CM | POA: Diagnosis not present

## 2019-05-14 ENCOUNTER — Ambulatory Visit: Payer: Medicare Other | Admitting: Cardiology

## 2019-05-21 ENCOUNTER — Ambulatory Visit (INDEPENDENT_AMBULATORY_CARE_PROVIDER_SITE_OTHER): Payer: Medicare Other

## 2019-05-21 ENCOUNTER — Encounter: Payer: Self-pay | Admitting: Internal Medicine

## 2019-05-21 ENCOUNTER — Ambulatory Visit (INDEPENDENT_AMBULATORY_CARE_PROVIDER_SITE_OTHER): Payer: Medicare Other | Admitting: Internal Medicine

## 2019-05-21 ENCOUNTER — Other Ambulatory Visit: Payer: Self-pay

## 2019-05-21 DIAGNOSIS — J869 Pyothorax without fistula: Secondary | ICD-10-CM

## 2019-05-21 DIAGNOSIS — J449 Chronic obstructive pulmonary disease, unspecified: Secondary | ICD-10-CM

## 2019-05-21 DIAGNOSIS — R053 Chronic cough: Secondary | ICD-10-CM

## 2019-05-21 DIAGNOSIS — I2510 Atherosclerotic heart disease of native coronary artery without angina pectoris: Secondary | ICD-10-CM | POA: Diagnosis not present

## 2019-05-21 DIAGNOSIS — R05 Cough: Secondary | ICD-10-CM

## 2019-05-21 NOTE — Progress Notes (Signed)
[name] [name], male    DOB: 05/22/1945,    MRN: [id number]   Brief patient profile:  89 yowm electrical engineer quit all smoking 2016  with R Empyema maint on spriva dpi /ACEi with variable cough but no doe since 2016 previously followed by Dr [name] [name] referred to pulmonary clinic 05/21/2019 by Dr   [name] [name]    History of Present Illness  05/21/2019  Pulmonary/ 1st office eval/[name] [name]  Chief Complaint  Patient presents with  . Pulmonary Consult    Referred by Dr. Holly [name] for eval of COPD. Former Dr [name] [name] pt. He is maintained on spiriva and does not use a rescue inhaler. He states his breathing is doing well currently.    Dyspnea:  3 x weekly treadmill x 50 min at 3.6 -4 and 4.5  Cough: comes and goes x years variably productive not noct  Sleep: does fine in recliner / can do flat in bed  SABA use: none   No obvious day to day or daytime variability or assoc excess/ purulent sputum or mucus plugs or hemoptysis or cp or chest tightness, subjective wheeze or overt sinus or hb symptoms.   Sleeping  without nocturnal  or early am exacerbation  of respiratory  c/o's or need for noct saba. Also denies any obvious fluctuation of symptoms with weather or environmental changes or other aggravating or alleviating factors except as outlined above   No unusual exposure hx or h/o childhood pna/ asthma or knowledge of premature birth.  Current Allergies, Complete Past Medical History, Past Surgical History, Family History, and Social History were reviewed in [employer] record.  ROS  The following are not active complaints unless bolded Hoarseness, sore throat, dysphagia, dental problems, itching, sneezing,  nasal congestion or discharge of excess mucus or purulent secretions, ear ache,   fever, chills, sweats, unintended wt loss or wt gain, classically pleuritic or exertional cp,  orthopnea pnd or arm/hand swelling  or leg swelling, presyncope, palpitations, abdominal pain, anorexia,  nausea, vomiting, diarrhea  or change in bowel habits or change in bladder habits, change in stools or change in urine, dysuria, hematuria,  rash, arthralgias, visual complaints, headache, numbness, weakness or ataxia or problems with walking or coordination,  change in mood or  memory.           Past Medical History:  Diagnosis Date  . Acute systolic heart failure ([name])   . COPD (chronic obstructive pulmonary disease) ([place])   . Enlarged prostate with lower urinary tract symptoms (LUTS)   . Hyperlipidemia   . Hypertension   . Low serum testosterone level   . PAF (paroxysmal atrial fibrillation) ([place])   . Pneumonia 05/2014  . Pyothorax without fistula ([place])   . Sciatica   . Shortness of breath dyspnea   . Solitary pulmonary nodule   . STEMI (ST elevation myocardial infarction) ([place])    09/27/17 PCI/DES x1 mLAD, PTCA of the diag branch. EF 30% Lifevest at discharge.   . Testicular hypofunction   . Urinary frequency     Outpatient Medications Prior to Visit  Medication Sig Dispense Refill  . ALPRAZolam (XANAX) 0.25 MG tablet Take 0.25 mg by mouth at bedtime as needed for anxiety.    Marland Kitchen aspirin EC 81 MG tablet Take 1 tablet (81 mg total) by mouth daily. 90 tablet 3  . Calcium Citrate-Vitamin D (CALCIUM CITRATE + D3 PO) Take 1 tablet by mouth daily.    . carvedilol (COREG) 6.25 MG tablet Take 1  tablet (6.25 mg total) by mouth 2 (two) times daily with a meal. 180 tablet 3  . Krill Oil 350 MG CAPS Take 350 mg by mouth daily.    . Lactobacillus (ACIDOPHILUS PROBIOTIC) 10 MG TABS Take 10 mg by mouth daily.     Marland Kitchen lisinopril (ZESTRIL) 5 MG tablet Take 1 tablet (5 mg total) by mouth daily. 90 tablet 3  . Multiple Vitamins-Minerals (SENIOR VITES PO) Take 1 tablet by mouth daily.    . nitroGLYCERIN (NITROSTAT) 0.4 MG SL tablet Place 1 tablet (0.4 mg total) under the tongue every 5 (five) minutes as needed. 25 tablet 2  . rosuvastatin (CRESTOR) 40 MG tablet Take 1 tablet (40 mg total) by mouth  daily. 90 tablet 3  . tamsulosin (FLOMAX) 0.4 MG CAPS capsule Take 0.4 mg by mouth daily.     Marland Kitchen tiotropium (SPIRIVA) 18 MCG inhalation capsule Place 1 capsule (18 mcg total) into inhaler and inhale daily. 30 capsule 1  . traZODone (DESYREL) 50 MG tablet Take 50 mg by mouth at bedtime as needed for sleep.           Objective:     BP 122/64 (BP Location: Left Arm, Cuff Size: Normal)   Pulse 61   Temp (!) 97.4 F (36.3 C) (Temporal)   Ht 6' (1.829 m)   Wt 196 lb (88.9 kg)   SpO2 96% Comment: on RA  BMI 26.58 kg/m   SpO2: 96 %(on RA)    HEENT : pt wearing mask not removed for exam due to covid -19 concerns.    NECK :  without JVD/Nodes/TM/ nl carotid upstrokes bilaterally   LUNGS: no acc muscle use,  Nl contour chest which is clear to A and P bilaterally without cough on insp or exp maneuvers   CV:  RRR  no s3 or murmur or increase in P2, and no edema   ABD:  soft and nontender with nl inspiratory excursion in the supine position. No bruits or organomegaly appreciated, bowel sounds nl  MS:  Nl gait/ ext warm without deformities, calf tenderness, cyanosis or clubbing No obvious joint restrictions   SKIN: warm and dry without lesions    NEURO:  alert, approp, nl sensorium with  no motor or cerebellar deficits apparent.      CXR PA and Lateral:   05/21/2019 :    I personally reviewed images and agree with radiology impression as follows: Patchy airspace disease seen at the right lung base. Left lung clear. Heart is normal size. Possible small right effusion. No acute bony abnormality. My interpretation : On PA comparison from   04/06/17  looks about the same with only pCXR's and ct 's in interim        Assessment   COPD   Quit all smoking 2016 with dx of R empyema with chronic cough since then - Try off spiriva 05/21/2019   Advised I reviewed the Fletcher curve with the patient that basically indicates  if you quit smoking when your best day FEV1 is still well  preserved (as is clearly  the case here)  it is highly unlikely you will progress to severe disease and informed the patient there was  no medication on the market that has proven to alter the curve/ its downward trajectory  or the likelihood of progression of their disease(unlike other chronic medical conditions such as atheroclerosis where we do think we can change the natural hx with risk reducing meds)    Therefore stopping  smoking and maintaining abstinence are  the most important aspects of care, not choice of inhalers or for that matter, doctors.   Treatment other than smoking cessation  is entirely directed by severity of symptoms and focused also on reducing exacerbations, not attempting to change the natural history of the disease.  Since this cough does not likely represent aecopd and he has no limiting sob reasonable to try off spiriva to see what effects if any this has on his cough or his ex tol.     Empyema of right pleural space ([place]) 06/27/2014 OPERATIVE REPORT- [facility] PREOPERATIVE DIAGNOSIS: Postpneumonic empyema with probable sepsis. POSTOPERATIVE DIAGNOSIS: Postpneumonic empyema with probable sepsis. SURGICAL PROCEDURE: Video-assisted bronchoscopy, right video-assisted thoracoscopy, mini thoracotomy, drainage of extensive empyema, and Decortication.  He has chronic stable scarring in R base which does not by itself warrant additional w/u at this point.      Chronic cough Onset 2016 assoc with empyema on R   Issue of recurrent aspiration raised by radiology but there is no evidence for this or assoc risk factors and I'm more inclined to think the cough is mostly uacs = Upper airway cough syndrome (previously labeled PNDS),  is so named because it's frequently impossible to sort out how much is  CR/sinusitis with freq throat clearing (which can be related to primary GERD)   vs  causing  secondary (" extra esophageal")  GERD from wide swings in gastric pressure that  occur with throat clearing, often  promoting self use of mint and menthol lozenges that reduce the lower esophageal sphincter tone and exacerbate the problem further in a cyclical fashion.   These are the same pts (now being labeled as having "irritable larynx syndrome" by some cough centers) who not infrequently have a history of having failed to tolerate ace inhibitors(on zestoril),  dry powder inhalers(on spiriva) or biphosphonates or report having atypical/extraesophageal reflux symptoms that don't respond to standard doses of PPI  and are easily confused as having aecopd or asthma flares by even experienced allergists/ pulmonologists (myself included).   Since this problem is chronic and he likely needs the zestoril more than the spiriva rec he try off the latter first then regroup in 2 months and in meantime try mucinex dm up to 1200 mg bid for the cough and avoid mint/menthol/chocolate which may promote gerd from coughing which induces more coughing in a cyclical pattern.  Consider off ACEi and DgEs next if not improving.         Each maintenance medication was reviewed in detail including emphasizing most importantly the difference between maintenance and prns and under what circumstances the prns are to be triggered using an action plan format where appropriate.  Total time for H and P, chart review, counseling, and generating customized AVS unique to this office visit / charting = 45 min        Christinia Gully, MD 05/21/2019

## 2019-05-21 NOTE — Patient Instructions (Addendum)
Try off spiriva to see if you notice any difference in your exercise tolerance or cough  For cough > mucinex dm up to 1200 mg every 12 hours as needed   Please remember to go to the  x-ray department  for your tests - we will call you with the results when they are available  Please schedule a follow up visit in 2 months at [facility] office with PFT's first if possible

## 2019-05-22 ENCOUNTER — Encounter: Payer: Self-pay | Admitting: Internal Medicine

## 2019-05-22 DIAGNOSIS — R053 Chronic cough: Secondary | ICD-10-CM | POA: Insufficient documentation

## 2019-05-22 DIAGNOSIS — R05 Cough: Secondary | ICD-10-CM | POA: Insufficient documentation

## 2019-05-22 NOTE — Progress Notes (Signed)
Discussed with patient by phone, no additional w/u for now

## 2019-05-22 NOTE — Assessment & Plan Note (Signed)
Quit all smoking 2016 with dx of R empyema with chronic cough since then - Try off spiriva 05/21/2019   Advised I reviewed the Fletcher curve with the patient that basically indicates  if you quit smoking when your best day FEV1 is still well preserved (as is clearly  the case here)  it is highly unlikely you will progress to severe disease and informed the patient there was  no medication on the market that has proven to alter the curve/ its downward trajectory  or the likelihood of progression of their disease(unlike other chronic medical conditions such as atheroclerosis where we do think we can change the natural hx with risk reducing meds)    Therefore stopping smoking and maintaining abstinence are  the most important aspects of care, not choice of inhalers or for that matter, doctors.   Treatment other than smoking cessation  is entirely directed by severity of symptoms and focused also on reducing exacerbations, not attempting to change the natural history of the disease.  Since this cough does not likely represent aecopd and he has no limiting sob reasonable to try off spiriva to see what effects if any this has on his cough or his ex tol.

## 2019-05-22 NOTE — Assessment & Plan Note (Signed)
Onset 2016 assoc with empyema on R   Issue of recurrent aspiration raised by radiology but there is no evidence for this or assoc risk factors and I'm more inclined to think the cough is mostly uacs = Upper airway cough syndrome (previously labeled PNDS),  is so named because it's frequently impossible to sort out how much is  CR/sinusitis with freq throat clearing (which can be related to primary GERD)   vs  causing  secondary (" extra esophageal")  GERD from wide swings in gastric pressure that occur with throat clearing, often  promoting self use of mint and menthol lozenges that reduce the lower esophageal sphincter tone and exacerbate the problem further in a cyclical fashion.   These are the same pts (now being labeled as having "irritable larynx syndrome" by some cough centers) who not infrequently have a history of having failed to tolerate ace inhibitors(on zestoril),  dry powder inhalers(on spiriva) or biphosphonates or report having atypical/extraesophageal reflux symptoms that don't respond to standard doses of PPI  and are easily confused as having aecopd or asthma flares by even experienced allergists/ pulmonologists (myself included).   Since this problem is chronic and he likely needs the zestoril more than the spiriva rec he try off the latter first then regroup in 2 months and in meantime try mucinex dm up to 1200 mg bid for the cough and avoid mint/menthol/chocolate which may promote gerd from coughing which induces more coughing in a cyclical pattern.  Consider off ACEi and DgEs next if not improving.           Each maintenance medication was reviewed in detail including emphasizing most importantly the difference between maintenance and prns and under what circumstances the prns are to be triggered using an action plan format where appropriate.  Total time for H and P, chart review, counseling, and generating customized AVS unique to this office visit / charting = 45 min

## 2019-05-22 NOTE — Assessment & Plan Note (Signed)
06/27/2014 OPERATIVE REPORT- [name] [name] PREOPERATIVE DIAGNOSIS: Postpneumonic empyema with probable sepsis. POSTOPERATIVE DIAGNOSIS: Postpneumonic empyema with probable sepsis. SURGICAL PROCEDURE: Video-assisted bronchoscopy, right video-assisted thoracoscopy, mini thoracotomy, drainage of extensive empyema, and Decortication.  He has chronic stable scarring in R base which does not by itself warrant additional w/u at this point.

## 2019-05-30 ENCOUNTER — Other Ambulatory Visit: Payer: Self-pay | Admitting: Family Medicine

## 2019-05-30 MED ORDER — TAMSULOSIN HCL 0.4 MG PO CAPS: 0.4000 mg | ORAL_CAPSULE | Freq: Every day | ORAL | 2 refills | Status: DC

## 2019-06-20 DIAGNOSIS — G4700 Insomnia, unspecified: Secondary | ICD-10-CM | POA: Diagnosis not present

## 2019-06-20 DIAGNOSIS — I25119 Atherosclerotic heart disease of native coronary artery with unspecified angina pectoris: Secondary | ICD-10-CM | POA: Diagnosis not present

## 2019-06-20 DIAGNOSIS — E785 Hyperlipidemia, unspecified: Secondary | ICD-10-CM | POA: Diagnosis not present

## 2019-06-20 DIAGNOSIS — F419 Anxiety disorder, unspecified: Secondary | ICD-10-CM | POA: Diagnosis not present

## 2019-06-20 DIAGNOSIS — B351 Tinea unguium: Secondary | ICD-10-CM | POA: Diagnosis not present

## 2019-06-20 DIAGNOSIS — Z0189 Encounter for other specified special examinations: Secondary | ICD-10-CM | POA: Diagnosis not present

## 2019-06-20 DIAGNOSIS — N400 Enlarged prostate without lower urinary tract symptoms: Secondary | ICD-10-CM | POA: Diagnosis not present

## 2019-06-20 DIAGNOSIS — I10 Essential (primary) hypertension: Secondary | ICD-10-CM | POA: Diagnosis not present

## 2019-06-24 ENCOUNTER — Other Ambulatory Visit: Payer: Self-pay

## 2019-06-24 ENCOUNTER — Encounter: Payer: Self-pay | Admitting: Cardiology

## 2019-06-24 ENCOUNTER — Ambulatory Visit (INDEPENDENT_AMBULATORY_CARE_PROVIDER_SITE_OTHER): Payer: Medicare Other | Admitting: Cardiology

## 2019-06-24 VITALS — BP 128/66 | HR 74 | Temp 97.1°F | Ht 72.0 in | Wt 198.0 lb

## 2019-06-24 DIAGNOSIS — I5022 Chronic systolic (congestive) heart failure: Secondary | ICD-10-CM | POA: Diagnosis not present

## 2019-06-24 DIAGNOSIS — E782 Mixed hyperlipidemia: Secondary | ICD-10-CM

## 2019-06-24 DIAGNOSIS — I2510 Atherosclerotic heart disease of native coronary artery without angina pectoris: Secondary | ICD-10-CM | POA: Diagnosis not present

## 2019-06-24 NOTE — Progress Notes (Signed)
Clinical Summary [name] [name] is a 74 y.o.male seen today for follow up of the following medical problems.   1. CAD/ICM/Chronic systolic HF - admit [id number] with STEMI. Received DES toLAD, PTCA of diag - 09/2017 echo LVEF 30-35%. He was discharged with lifevest for primary prevention - repeat 10/2017 35-40%, lifevest discontinued - given afib was started on plavix, asa, and anticoag x 1 month, then plavix and anticoag.  - medical therapy initially limited due to soft bp's andpriorrenal dysfunction.   02/2019 echo: LVEF [id number], grade I diasotlic dysfunction  - no recent chest pain.  - no SOB or DOE - compliant with meds.    2. COPD - followed by Dr Melvyn Novas    3. Afib in setting of STEMI - new diagnosis during his admission with acute MI. Unclear if isolated in setting of MI 02/2018 event monitor without recurrent afib, eliquis was stopped  - no recent palpitations  4. Hyperlipidemia - compliant with statin  10/2018 TC 109 TC 121 HDL 39 LDL 63    Has completed covid vaccine x 2, last shot 05/08/19 Past Medical History:  Diagnosis Date  . Acute systolic heart failure ([place])   . COPD (chronic obstructive pulmonary disease) ([place])   . Enlarged prostate with lower urinary tract symptoms (LUTS)   . Hyperlipidemia   . Hypertension   . Low serum testosterone level   . PAF (paroxysmal atrial fibrillation) ([place])   . Pneumonia 05/2014  . Pyothorax without fistula ([place])   . Sciatica   . Shortness of breath dyspnea   . Solitary pulmonary nodule   . STEMI (ST elevation myocardial infarction) ([place])    09/27/17 PCI/DES x1 mLAD, PTCA of the diag [name] [name]. EF 30% Lifevest at discharge.   . Testicular hypofunction   . Urinary frequency      No Known Allergies   Current Outpatient Medications  Medication Sig Dispense Refill  . ALPRAZolam (XANAX) 0.25 MG tablet Take 0.25 mg by mouth at bedtime as needed for anxiety.    Marland Kitchen aspirin EC 81 MG tablet Take 1 tablet (81 mg  total) by mouth daily. 90 tablet 3  . Calcium Citrate-Vitamin D (CALCIUM CITRATE + D3 PO) Take 1 tablet by mouth daily.    . carvedilol (COREG) 6.25 MG tablet Take 1 tablet (6.25 mg total) by mouth 2 (two) times daily with a meal. 180 tablet 3  . Krill Oil 350 MG CAPS Take 350 mg by mouth daily.    . Lactobacillus (ACIDOPHILUS PROBIOTIC) 10 MG TABS Take 10 mg by mouth daily.     Marland Kitchen lisinopril (ZESTRIL) 5 MG tablet Take 1 tablet (5 mg total) by mouth daily. 90 tablet 3  . Multiple Vitamins-Minerals (SENIOR VITES PO) Take 1 tablet by mouth daily.    . nitroGLYCERIN (NITROSTAT) 0.4 MG SL tablet Place 1 tablet (0.4 mg total) under the tongue every 5 (five) minutes as needed. 25 tablet 2  . rosuvastatin (CRESTOR) 40 MG tablet Take 1 tablet (40 mg total) by mouth daily. 90 tablet 3  . tamsulosin (FLOMAX) 0.4 MG CAPS capsule Take 1 capsule (0.4 mg total) by mouth daily. 30 capsule 2  . tiotropium (SPIRIVA) 18 MCG inhalation capsule Place 1 capsule (18 mcg total) into inhaler and inhale daily. 30 capsule 1  . traZODone (DESYREL) 50 MG tablet Take 50 mg by mouth at bedtime as needed for sleep.     No current facility-administered medications for this visit.     Past Surgical History:  Procedure Laterality Date  . APPENDECTOMY    . BACK SURGERY    . COLONOSCOPY  2011   Tubular adenoma per patient  . COLONOSCOPY N/A 03/03/2015   Dr. Oneida Alar: Left colon is redundant, mild diverticulosis, small internal hemorrhoids, moderate external hemorrhoids.  Next colonoscopy in 5 years with overtube.  . COLONOSCOPY N/A 03/25/2019   Procedure: COLONOSCOPY;  Surgeon: Danie Binder, MD;  [facility];  Service: Endoscopy;  Laterality: N/A;  8:30am - Camille's request  . CORONARY/GRAFT ACUTE MI REVASCULARIZATION N/A 09/27/2017   Procedure: Coronary/Graft Acute MI Revascularization;  Surgeon: Burnell Blanks, MD;  Location: [place] [facility];  Service: Cardiovascular;  Laterality: N/A;  . EMPYEMA  DRAINAGE Right 06/27/2014   Procedure: EMPYEMA DRAINAGE;  Surgeon: Grace Isaac, MD;  Location: [place];  Service: Thoracic;  Laterality: Right;  . HEMORRHOID BANDING N/A 03/03/2015   Procedure: HEMORRHOID BANDING;  Surgeon: Danie Binder, MD;  [facility];  Service: Endoscopy;  Laterality: N/A;  Recomended to see a Psychologist, sport and exercise.  [name]    . INTRAVASCULAR ULTRASOUND/IVUS N/A 09/27/2017   Procedure: Intravascular Ultrasound/IVUS;  Surgeon: Burnell Blanks, MD;  Location: [place] [facility];  Service: Cardiovascular;  Laterality: N/A;  . LEFT HEART CATH AND CORONARY ANGIOGRAPHY N/A 09/27/2017   Procedure: LEFT HEART CATH AND CORONARY ANGIOGRAPHY;  Surgeon: Burnell Blanks, MD;  Location: [place] [facility];  Service: Cardiovascular;  Laterality: N/A;  . LUNG SURGERY  2016  . POLYPECTOMY  03/25/2019   Procedure: POLYPECTOMY;  Surgeon: Danie Binder, MD;  [facility];  Service: Endoscopy;;  . TEE WITHOUT CARDIOVERSION N/A 07/14/2014   Procedure: TRANSESOPHAGEAL ECHOCARDIOGRAM (TEE);  Surgeon: Sueanne Margarita, MD;  Location: [facility] ENDOSCOPY;  Service: Cardiovascular;  Laterality: N/A;  . VARICOSE VEIN SURGERY    . VIDEO ASSISTED THORACOSCOPY Right 06/27/2014   Procedure: VIDEO ASSISTED THORACOSCOPY;  Surgeon: Grace Isaac, MD;  Location: [place];  Service: Thoracic;  Laterality: Right;  Marland Kitchen VIDEO BRONCHOSCOPY N/A 06/27/2014   Procedure: VIDEO BRONCHOSCOPY;  Surgeon: Grace Isaac, MD;  Location: [facility] OR;  Service: Thoracic;  Laterality: N/A;     No Known Allergies    Family History  Problem Relation Age of Onset  . Tuberculosis Father   . Hypertension Father   . COPD Mother   . Hypertension Mother   . Colon cancer Neg Hx      Social History [name] [name] reports that he quit smoking about 5 years ago. His smoking use included pipe. He has never used smokeless tobacco. [name] [name] reports current alcohol use.   Review of Systems CONSTITUTIONAL: No  weight loss, fever, chills, weakness or fatigue.  HEENT: Eyes: No visual loss, blurred vision, double vision or yellow sclerae.No hearing loss, sneezing, congestion, runny nose or sore throat.  SKIN: No rash or itching.  CARDIOVASCULAR: per hpi RESPIRATORY: No shortness of breath, cough or sputum.  GASTROINTESTINAL: No anorexia, nausea, vomiting or diarrhea. No abdominal pain or blood.  GENITOURINARY: No burning on urination, no polyuria NEUROLOGICAL: No headache, dizziness, syncope, paralysis, ataxia, numbness or tingling in the extremities. No change in bowel or bladder control.  MUSCULOSKELETAL: No muscle, back pain, joint pain or stiffness.  LYMPHATICS: No enlarged nodes. No history of splenectomy.  PSYCHIATRIC: No history of depression or anxiety.  ENDOCRINOLOGIC: No reports of sweating, cold or heat intolerance. No polyuria or polydipsia.  Marland Kitchen   Physical Examination Today's Vitals   06/24/19  0829  BP: 128/66  Pulse: 74  Temp: (!) 97.1 F (36.2 C)  SpO2: 99%  Weight: 198 lb (89.8 kg)  Height: 6' (1.829 m)   Body mass index is 26.85 kg/m.  Gen: resting comfortably, no acute distress HEENT: no scleral icterus, pupils equal round and reactive, no palptable cervical adenopathy,  CV: RRR, no m/r/g, no jvd Resp: Clear to auscultation bilaterally GI: abdomen is soft, non-tender, non-distended, normal bowel sounds, no hepatosplenomegaly MSK: extremities are warm, no edema.  Skin: warm, no rash Neuro:  no focal deficits Psych: appropriate affect   Diagnostic Studies  09/2017 cath  Prox RCA lesion is 30% stenosed.  Prox LAD lesion is 100% stenosed.  Prox Cx lesion is 20% stenosed.  A drug-eluting stent was successfully placed using a STENT SYNERGY DES 3X16.  Post intervention, there is a 0% residual stenosis.  Ost 1st Diag lesion is 100% stenosed.  Balloon angioplasty was performed using a BALLOON SAPPHIRE 2.5X12.  Post intervention, there is a 40% residual  stenosis.  Prox LAD to Mid LAD lesion is 40% stenosed.  1. Acute anterior STEMI secondary to occlusion of the mid LAD 2. Successful PTCA/DES x 1 mid LAD 3. Successful PTCA/angioplasty ostium of the Diagonal [name] [name].  4. Mild non-obstructive disease in the RCA, circumflex and mid LAD  Post cathRecommendations:  Will admit to the ICU and continue Aggrastat for 6 hours. Will start high intensity statin and a beta blocker. Echo later today.  . Recommend uninterrupted dual antiplatelet therapy with Aspirin 81mg  daily and Ticagrelor 90mg  twice dailyfor a minimum of 12 months (ACS - Class I recommendation).   09/2017 echo Study Conclusions  - Left ventricle: The cavity size was normal. There was mild focal basal hypertrophy of the septum. Systolic function was moderately to severely reduced. The estimated ejection fraction was in the range of 30% to 35%. Severe hypokinesis of the mid to apical anteroseptal, anterior, and anterolateral myocardium. Dyskinetic at apex. Doppler parameters are consistent with abnormal left ventricular relaxation (grade 1 diastolic dysfunction). - Aortic valve: Trileaflet; mildly thickened, mildly calcified leaflets. - Mitral valve: Calcified annulus. There was trivial regurgitation. - Pulmonary arteries: PA peak pressure: 41 mm Hg (S). - Pericardium, extracardiac: A small pericardial effusion was identified.  Impressions:  - New reduction in LV EF with regional wall motion abnormalities in the mid to apical anteroseptum, anterior, and anterolateral walls with dyskinesis at the apex. Small pericardial effusion noted.  10/2017 echo Study Conclusions  - Left ventricle: The cavity size was normal. Wall thickness was increased in a pattern of moderate LVH. Systolic function was moderately reduced. The estimated ejection fraction was in the range of 35% to 40%. Doppler parameters are consistent with abnormal left  ventricular relaxation (grade 1 diastolic dysfunction). Doppler parameters are consistent with high ventricular filling pressure. - Regional wall motion abnormality: Hypokinesis of the apical septal, apical lateral, and apical myocardium. - Aortic valve: Mildly calcified annulus. Trileaflet; mildly thickened leaflets. Valve area (VTI): 4.91 cm^2. Valve area (Vmax): 4.27 cm^2. Valve area (Vmean): 3.54 cm^2. - Mitral valve: Mildly calcified annulus. Mildly thickened leaflets . - Left atrium: The atrium was moderately dilated. - Pericardium, extracardiac: There is a small circumferential pericardial effusion. The effusion measures 0.8 cm adjacent to the LV. There is no evidence of tamponade physiology. - Technically adequate study.   02/2018 heart monitor  21 day event monitor  Min HR 48, Max HR 130, Avg HR 71. Min HR occurred in early AM hours presumably while  sleeping  No symptoms reported  4 beat run of NSVT that was asymptomatic   02/2019 echo  IMPRESSIONS   1. Left ventricular ejection fraction, by visual estimation, is 40 to 45%. The left ventricle has mildly decreased function. There is moderately increased left ventricular hypertrophy. 2. Elevated left atrial pressure. 3. Left ventricular diastolic parameters are consistent with Grade I diastolic dysfunction (impaired relaxation). 4. Hypokinesis of the apex, mid to distal anteroseptal, apical lateral, and apical myocardium. 5. Global right ventricle has normal systolic function.The right ventricular size is normal. No increase in right ventricular wall thickness. 6. Left atrial size was moderately dilated. 7. Right atrial size was normal. 8. The mitral valve is normal in structure. Mild mitral valve regurgitation. No evidence of mitral stenosis. 9. The tricuspid valve is normal in structure. Tricuspid valve regurgitation is not demonstrated. 10. The aortic valve is tricuspid. Aortic valve  regurgitation is not visualized. No evidence of aortic valve sclerosis or stenosis. 11. The pulmonic valve was not well visualized. Pulmonic valve regurgitation is not visualized. 12. The inferior vena cava is normal in size with greater than 50% respiratory variability, suggesting right atrial pressure of 3 mmHg.   Assessment and Plan   1. CAD/ICM/chronic systolic HF - no recent symptoms.  - cntinue current meds. Medical therpay has been limited previously by soft bp's and some renal dysfunction   2. Hyperlipidemia - at goal, continue staitn       Arnoldo Lenis, M.D.

## 2019-06-24 NOTE — Patient Instructions (Signed)
Medication Instructions:  Your physician recommends that you continue on your current medications as directed. Please refer to the Current Medication list given to you today.  *If you need a refill on your cardiac medications before your next appointment, please call your pharmacy*   Lab Work: None today If you have labs (blood work) drawn today and your tests are completely normal, you will receive your results only by: . MyChart Message (if you have MyChart) OR . A paper copy in the mail If you have any lab test that is abnormal or we need to change your treatment, we will call you to review the results.   Testing/Procedures: None today   Follow-Up: At [facility], you and your health needs are our priority.  As part of our continuing mission to provide you with exceptional heart care, we have created designated Provider Care Teams.  These Care Teams include your primary Cardiologist (physician) and Advanced Practice Providers (APPs -  Physician Assistants and Nurse Practitioners) who all work together to provide you with the care you need, when you need it.  We recommend signing up for the patient portal called "MyChart".  Sign up information is provided on this After Visit Summary.  MyChart is used to connect with patients for Virtual Visits (Telemedicine).  Patients are able to view lab/test results, encounter notes, upcoming appointments, etc.  Non-urgent messages can be sent to your provider as well.   To learn more about what you can do with MyChart, go to [email or web address].    Your next appointment:   6 month(s)  The format for your next appointment:   In Person  Provider:   Jonathan Branch, MD   Other Instructions None       Thank you for choosing [facility] !         

## 2019-07-03 ENCOUNTER — Other Ambulatory Visit (HOSPITAL_COMMUNITY): Payer: Medicare Other

## 2019-07-30 DIAGNOSIS — B351 Tinea unguium: Secondary | ICD-10-CM | POA: Diagnosis not present

## 2019-08-02 ENCOUNTER — Other Ambulatory Visit: Payer: Self-pay

## 2019-08-02 ENCOUNTER — Other Ambulatory Visit (HOSPITAL_COMMUNITY)
Admission: RE | Admit: 2019-08-02 | Discharge: 2019-08-02 | Disposition: A | Discharge status: Home / Self Care | Payer: Medicare Other | Source: Ambulatory Visit | Attending: Internal Medicine | Admitting: Internal Medicine

## 2019-08-02 DIAGNOSIS — Z20822 Contact with and (suspected) exposure to covid-19: Secondary | ICD-10-CM | POA: Insufficient documentation

## 2019-08-02 DIAGNOSIS — Z01812 Encounter for preprocedural laboratory examination: Secondary | ICD-10-CM | POA: Insufficient documentation

## 2019-08-03 LAB — SARS CORONAVIRUS 2 (TAT 6-24 HRS): SARS Coronavirus 2: NEGATIVE

## 2019-08-06 ENCOUNTER — Ambulatory Visit (INDEPENDENT_AMBULATORY_CARE_PROVIDER_SITE_OTHER): Payer: Medicare Other | Admitting: Adult Health

## 2019-08-06 ENCOUNTER — Ambulatory Visit: Payer: Medicare Other | Admitting: Internal Medicine

## 2019-08-06 ENCOUNTER — Encounter: Payer: Self-pay | Admitting: Adult Health

## 2019-08-06 ENCOUNTER — Other Ambulatory Visit: Payer: Self-pay

## 2019-08-06 ENCOUNTER — Ambulatory Visit (INDEPENDENT_AMBULATORY_CARE_PROVIDER_SITE_OTHER): Payer: Medicare Other | Admitting: Internal Medicine

## 2019-08-06 DIAGNOSIS — J449 Chronic obstructive pulmonary disease, unspecified: Secondary | ICD-10-CM

## 2019-08-06 DIAGNOSIS — I2510 Atherosclerotic heart disease of native coronary artery without angina pectoris: Secondary | ICD-10-CM | POA: Diagnosis not present

## 2019-08-06 DIAGNOSIS — R053 Chronic cough: Secondary | ICD-10-CM

## 2019-08-06 DIAGNOSIS — R05 Cough: Secondary | ICD-10-CM

## 2019-08-06 DIAGNOSIS — J869 Pyothorax without fistula: Secondary | ICD-10-CM

## 2019-08-06 LAB — PULMONARY FUNCTION TEST
DL/VA % pred: 79 %
DL/VA: 3.14 ml/min/mmHg/L
DLCO cor % pred: 66 %
DLCO cor: 18.09 ml/min/mmHg
DLCO unc % pred: 66 %
DLCO unc: 18.09 ml/min/mmHg
FEF 25-75 Post: 1.86 L/sec
FEF 25-75 Pre: 1.13 L/sec
FEF2575-%Change-Post: 65 %
FEF2575-%Pred-Post: 73 %
FEF2575-%Pred-Pre: 44 %
FEV1-%Change-Post: 14 %
FEV1-%Pred-Post: 75 %
FEV1-%Pred-Pre: 66 %
FEV1-Post: 2.6 L
FEV1-Pre: 2.26 L
FEV1FVC-%Change-Post: 6 %
FEV1FVC-%Pred-Pre: 81 %
FEV6-%Change-Post: 8 %
FEV6-%Pred-Post: 91 %
FEV6-%Pred-Pre: 84 %
FEV6-Post: 4.05 L
FEV6-Pre: 3.74 L
FEV6FVC-%Change-Post: 0 %
FEV6FVC-%Pred-Post: 105 %
FEV6FVC-%Pred-Pre: 104 %
FVC-%Change-Post: 7 %
FVC-%Pred-Post: 86 %
FVC-%Pred-Pre: 80 %
FVC-Post: 4.08 L
FVC-Pre: 3.8 L
Post FEV1/FVC ratio: 64 %
Post FEV6/FVC ratio: 99 %
Pre FEV1/FVC ratio: 60 %
Pre FEV6/FVC Ratio: 98 %

## 2019-08-06 MED ORDER — STIOLTO RESPIMAT 2.5-2.5 MCG/ACT IN AERS
2.0000 | INHALATION_SPRAY | Freq: Every day | RESPIRATORY_TRACT | 6 refills | Status: DC
Start: 2019-08-06 — End: 2019-10-15

## 2019-08-06 MED ORDER — STIOLTO RESPIMAT 2.5-2.5 MCG/ACT IN AERS: 2.0000 | INHALATION_SPRAY | Freq: Every day | RESPIRATORY_TRACT | 0 refills | Status: DC

## 2019-08-06 MED ORDER — ALBUTEROL SULFATE HFA 108 (90 BASE) MCG/ACT IN AERS: 1.0000 | INHALATION_SPRAY | Freq: Four times a day (QID) | RESPIRATORY_TRACT | 2 refills | Status: DC | PRN

## 2019-08-06 NOTE — Assessment & Plan Note (Signed)
Moderate COPD with a reversible component.  Patient has very low symptom burden.  He is very active. Do believe that changing ACE inhibitor may help his daily ongoing cough. We will give him a trial of Stiolto to see if this helps with his breathing if no perceived benefit may leave off maintenance therapy.  May also have an albuterol inhaler for just as needed use. Patient has a 10 pack smoking history however is a heavy pipe smoker.  Says he did not qualify for low-dose CT screening program in the past.  Plan  Patient Instructions  Activity as tolerated.  Would discuss with Cardiology that Lisinopril may be aggravating your cough .  Trial of Stiolto 2 puffs daily .  May use Albuterol Inhaler 1-2 puffs every 6hrs as needed.  Follow up with Dr. Melvyn Novas in 4-6 months and As needed  -[place] office .

## 2019-08-06 NOTE — Assessment & Plan Note (Addendum)
Previous empyema with VATS with drainage in 2016.  CT chest shows some abnormalities in the right base.  Probably most consistent with previous empyema.  Last CT August 2020. Continue to follow

## 2019-08-06 NOTE — Progress Notes (Signed)
Full PFT performed today. °

## 2019-08-06 NOTE — Assessment & Plan Note (Signed)
Daily ongoing cough.  Could consider changing ACE inhibitor to see if this would help some.  We will leave this to his primary care and/or cardiology

## 2019-08-06 NOTE — Patient Instructions (Addendum)
Activity as tolerated.  Would discuss with Cardiology that Lisinopril may be aggravating your cough .  Trial of Stiolto 2 puffs daily .  May use Albuterol Inhaler 1-2 puffs every 6hrs as needed.  Follow up with Dr. Melvyn Novas in 4-6 months and As needed  -[place] office .

## 2019-08-06 NOTE — Progress Notes (Signed)
@Patient  ID: [name] [name], male    DOB: May 02, 1945, 74 y.o.   MRN: [id number]  Chief Complaint  Patient presents with  . Follow-up    COPD     Referring provider: Maryruth Hancock, MD  HPI: 74 year old male former smoker (quit in 2016) seen for pulmonary consult  March 2021 for evaluation of COPD History of empyema in 2016 post VATS with mini thoracotomy and drainage of empyema with decortication, congestive heart failure, A. fib, hypertension, coronary artery disease  TEST/EVENTS :   08/06/2019 Follow up : COPD  Patient presents for a 65-month follow-up.  Patient was seen last visit for a pulmonary consult to establish for COPD. Former Dr. [name] patient .  Patient was set up for pulmonary function testing that was done today that showed moderate airflow obstruction with an FEV1 at 75%, ratio 64, FVC 86%, significant bronchodilator response (14% change) DLCO 66% Since last visit patient is feeling okay with no increased cough . No wheezing . Spiriva was stopped last ov , did not notice any change in breathing .  Patient is on an ACE inhibitor. Has chronic cough, minimal productive.  Walks on treadmill 3 x week . Exercises on reg basis . No significant dyspnea.   Took Zpack ~ 1-2 year . No antibiotics for last year.   Previous CT chest 10/2018 showed near complete resolution of right-sided pneumonia.  Extensive nodularity of the right lung base and frothy debris in the right mainstem bronchus duration.  And positive emphysema.    No Known Allergies  Immunization History  Administered Date(s) Administered  . Fluad Quad(high Dose 65+) 11/09/2018  . Influenza Split 01/19/2014, 11/13/2014, 12/18/2017  . Influenza, High Dose Seasonal PF 11/08/2016  . Influenza-Unspecified 12/05/2012, 11/17/2013, 10/20/2014, 11/19/2017, 11/09/2018  . Moderna SARS-COVID-2 Vaccination 04/11/2019, 05/08/2019  . Pneumococcal Conjugate-13 07/22/2011, 08/08/2013  . Pneumococcal Polysaccharide-23  03/12/2012, 07/21/2012, 02/06/2018  . Tdap 08/08/2013  . Zoster 08/21/2010    Past Medical History:  Diagnosis Date  . Acute systolic heart failure ([place])   . COPD (chronic obstructive pulmonary disease) ([place])   . Enlarged prostate with lower urinary tract symptoms (LUTS)   . Hyperlipidemia   . Hypertension   . Low serum testosterone level   . PAF (paroxysmal atrial fibrillation) ([place])   . Pneumonia 05/2014  . Pyothorax without fistula ([place])   . Sciatica   . Shortness of breath dyspnea   . Solitary pulmonary nodule   . STEMI (ST elevation myocardial infarction) ([place])    09/27/17 PCI/DES x1 mLAD, PTCA of the diag branch. EF 30% Lifevest at discharge.   . Testicular hypofunction   . Urinary frequency     Tobacco History: Social History   Tobacco Use  Smoking Status Former Smoker  . Packs/day: 1.00  . Years: 10.00  . Pack years: 10.00  . Types: Pipe, Cigarettes  . Quit date: 06/07/2014  . Years since quitting: 5.1  Smokeless Tobacco Never Used  Tobacco Comment   smoked pipe for 40 years, daily   Counseling given: Not Answered Comment: smoked pipe for 40 years, daily   Outpatient Medications Prior to Visit  Medication Sig Dispense Refill  . acetaminophen (TYLENOL) 325 MG tablet Take 650 mg by mouth every 6 (six) hours as needed.    Marland Kitchen aspirin EC 81 MG tablet Take 1 tablet (81 mg total) by mouth daily. 90 tablet 3  . Calcium Citrate-Vitamin D (CALCIUM CITRATE + D3 PO) Take 1 tablet by mouth daily.    Marland Kitchen  carvedilol (COREG) 6.25 MG tablet Take 1 tablet (6.25 mg total) by mouth 2 (two) times daily with a meal. 180 tablet 3  . Krill Oil 350 MG CAPS Take 350 mg by mouth daily.    . Lactobacillus (ACIDOPHILUS PROBIOTIC) 10 MG TABS Take 10 mg by mouth daily.     . Multiple Vitamins-Minerals (SENIOR VITES PO) Take 1 tablet by mouth daily.    . nitroGLYCERIN (NITROSTAT) 0.4 MG SL tablet Place 1 tablet (0.4 mg total) under the tongue every 5 (five) minutes as needed. 25 tablet 2  .  rosuvastatin (CRESTOR) 40 MG tablet Take 1 tablet (40 mg total) by mouth daily. 90 tablet 3  . tamsulosin (FLOMAX) 0.4 MG CAPS capsule Take 1 capsule (0.4 mg total) by mouth daily. 30 capsule 2  . traZODone (DESYREL) 50 MG tablet Take 50 mg by mouth at bedtime as needed for sleep.    Marland Kitchen lisinopril (ZESTRIL) 5 MG tablet Take 1 tablet (5 mg total) by mouth daily. 90 tablet 3   No facility-administered medications prior to visit.     Review of Systems:   Constitutional:   No  weight loss, night sweats,  Fevers, chills, fatigue, or  lassitude.  HEENT:   No headaches,  Difficulty swallowing,  Tooth/dental problems, or  Sore throat,                No sneezing, itching, ear ache, nasal congestion, post nasal drip,   CV:  No chest pain,  Orthopnea, PND, swelling in lower extremities, anasarca, dizziness, palpitations, syncope.   GI  No heartburn, indigestion, abdominal pain, nausea, vomiting, diarrhea, change in bowel habits, loss of appetite, bloody stools.   Resp: No shortness of breath with exertion or at rest.  No excess mucus, no productive cough,  No non-productive cough,  No coughing up of blood.  No change in color of mucus.  No wheezing.  No chest wall deformity  Skin: no rash or lesions.  GU: no dysuria, change in color of urine, no urgency or frequency.  No flank pain, no hematuria   MS:  No joint pain or swelling.  No decreased range of motion.  No back pain.    Physical Exam  BP 106/60 (BP Location: Left Arm, Cuff Size: Normal)   Pulse 61   Temp 98 F (36.7 C) (Temporal)   Ht 6' (1.829 m)   Wt 198 lb (89.8 kg)   SpO2 95% Comment: RA  BMI 26.85 kg/m   GEN: A/Ox3; pleasant , NAD, well nourished    HEENT:  [place]/AT,   Throat clear, no lesions, no postnasal drip or exudate noted.   NECK:  Supple w/ fair ROM; no JVD; normal carotid impulses w/o bruits; no thyromegaly or nodules palpated; no lymphadenopathy.    RESP  Clear  P & A; w/o, wheezes/ rales/ or rhonchi. no  accessory muscle use, no dullness to percussion  CARD:  RRR, no m/r/g, no peripheral edema, pulses intact, no cyanosis or clubbing.  GI:   Soft & nt; nml bowel sounds; no organomegaly or masses detected.   Musco: Warm bil, no deformities or joint swelling noted.   Neuro: alert, no focal deficits noted.    Skin: Warm, no lesions or rashes    Lab Results:   BNP  ProBNP No results found for: PROBNP  Imaging: No results found.    PFT Results Latest Ref Rng & Units 08/06/2019  FVC-Pre L 3.80  FVC-Predicted Pre % 80  FVC-Post L  4.08  FVC-Predicted Post % 86  Pre FEV1/FVC % % 60  Post FEV1/FCV % % 64  FEV1-Pre L 2.26  FEV1-Predicted Pre % 66  FEV1-Post L 2.60  DLCO UNC% % 66  DLCO COR %Predicted % 79    No results found for: NITRICOXIDE      Assessment & Plan:   COPD (chronic obstructive pulmonary disease) Moderate COPD with a reversible component.  Patient has very low symptom burden.  He is very active. Do believe that changing ACE inhibitor may help his daily ongoing cough. We will give him a trial of Stiolto to see if this helps with his breathing if no perceived benefit may leave off maintenance therapy.  May also have an albuterol inhaler for just as needed use. Patient has a 10 pack smoking history however is a heavy pipe smoker.  Says he did not qualify for low-dose CT screening program in the past.  Plan  Patient Instructions  Activity as tolerated.  Would discuss with Cardiology that Lisinopril may be aggravating your cough .  Trial of Stiolto 2 puffs daily .  May use Albuterol Inhaler 1-2 puffs every 6hrs as needed.  Follow up with Dr. Melvyn Novas in 4-6 months and As needed  -[place] office .        Chronic cough Daily ongoing cough.  Could consider changing ACE inhibitor to see if this would help some.  We will leave this to his primary care and/or cardiology  Empyema of right pleural space [facility]) Previous empyema with VATS with drainage in 2016.   CT chest shows some abnormalities in the right base.  Probably most consistent with previous empyema.  Last CT August 2020. We will continue to follow on x-ray and/or CT.     Rexene Edison, NP 08/06/2019

## 2019-08-07 DIAGNOSIS — D225 Melanocytic nevi of trunk: Secondary | ICD-10-CM | POA: Diagnosis not present

## 2019-08-07 DIAGNOSIS — Z1283 Encounter for screening for malignant neoplasm of skin: Secondary | ICD-10-CM | POA: Diagnosis not present

## 2019-08-08 ENCOUNTER — Telehealth: Payer: Self-pay | Admitting: *Deleted

## 2019-08-09 ENCOUNTER — Other Ambulatory Visit: Payer: Self-pay | Admitting: *Deleted

## 2019-08-09 NOTE — Telephone Encounter (Signed)
Ok to change, would stop lisinopril and start losartan 12.5mg  daily. Has a lot of the same benefits as lisinopril without the cough issues   J Kadar Chance MD

## 2019-08-12 ENCOUNTER — Telehealth: Payer: Self-pay

## 2019-08-12 NOTE — Telephone Encounter (Signed)
Left another message for the pt to call back.  

## 2019-08-12 NOTE — Telephone Encounter (Signed)
LMTCB  Note   Ok to change, would stop lisinopril and start losartan 12.5mg  daily. Has a lot of the same benefits as lisinopril without the cough issue

## 2019-08-12 NOTE — Telephone Encounter (Signed)
Pt returned call

## 2019-08-12 NOTE — Telephone Encounter (Signed)
Pt is asking for a response to My-Chart message. [id number]  Please call [phone]  Thanks renee

## 2019-08-13 MED ORDER — LOSARTAN POTASSIUM 25 MG PO TABS: 12.5000 mg | ORAL_TABLET | Freq: Every day | ORAL | 3 refills | Status: DC

## 2019-08-13 NOTE — Telephone Encounter (Signed)
Pt notified and voiced understanding. Pt voiced he would like 3 month supply sent to [facility] at [facility], [place] because he is currently out of town.

## 2019-08-27 DIAGNOSIS — R109 Unspecified abdominal pain: Secondary | ICD-10-CM | POA: Diagnosis not present

## 2019-08-29 DIAGNOSIS — M5136 Other intervertebral disc degeneration, lumbar region: Secondary | ICD-10-CM | POA: Diagnosis not present

## 2019-08-29 DIAGNOSIS — N23 Unspecified renal colic: Secondary | ICD-10-CM | POA: Diagnosis not present

## 2019-08-29 DIAGNOSIS — Z8719 Personal history of other diseases of the digestive system: Secondary | ICD-10-CM | POA: Diagnosis not present

## 2019-08-29 DIAGNOSIS — Z9889 Other specified postprocedural states: Secondary | ICD-10-CM | POA: Diagnosis not present

## 2019-08-29 DIAGNOSIS — R109 Unspecified abdominal pain: Secondary | ICD-10-CM | POA: Diagnosis not present

## 2019-10-10 ENCOUNTER — Telehealth: Payer: Self-pay | Admitting: Adult Health

## 2019-10-10 DIAGNOSIS — E785 Hyperlipidemia, unspecified: Secondary | ICD-10-CM | POA: Diagnosis not present

## 2019-10-10 DIAGNOSIS — I10 Essential (primary) hypertension: Secondary | ICD-10-CM | POA: Diagnosis not present

## 2019-10-11 NOTE — Telephone Encounter (Signed)
Spoke with patient. I advised him that we have received the PA request and that I would start the request today. He verbalized understanding. PA was started on [email or web address]  Key: [id number]  Determination will be made within 1-3 business days.

## 2019-10-15 MED ORDER — BEVESPI AEROSPHERE 9-4.8 MCG/ACT IN AERO: 2.0000 | INHALATION_SPRAY | Freq: Two times a day (BID) | RESPIRATORY_TRACT | 11 refills | Status: DC

## 2019-10-15 NOTE — Telephone Encounter (Signed)
Attempted to call pt but unable to reach. Left message for him to return call. °

## 2019-10-15 NOTE — Telephone Encounter (Signed)
Pt returning missed call. Gives verbal consent to leave detailed vm if he misses the call. [phone]

## 2019-10-15 NOTE — Telephone Encounter (Signed)
Called and spoke with pt letting him know the info stated by MW and he verbalized understanding. Rx for Charolotte Eke has been sent to pharmacy for pt. Nothing further needed.

## 2019-10-15 NOTE — Telephone Encounter (Signed)
bevespi is the same drug, just a different delivery system (same though  as the albuterol rescue inhaler he has used)  and dosing:  Rx:  Take 2 puffs first thing in am and then another 2 puffs about 12 hours later.   If not happy with it p a month need to try anoro which is completely different device and therefore require ov to teach effectively

## 2019-10-15 NOTE — Telephone Encounter (Signed)
Checked CMM about status of the PA:  Your request was denied We have denied coverage or payment under your Medicare Part D benefit for the following prescription drug(s)  that you or your prescriber requested: STIOLTO RESPIMAT 2.5/2.5/MCG/ACT Aerosol Soln Why did we deny your request? We denied this request under Medicare Part D because: You or your prescriber have requested a medication  which is not on your plan's Drug List. You must demonstrate failure of or an adverse reaction to two formulary  alternatives in order for this drug to be covered. Based on the information submitted, there is no evidence that  you have tried and failed or cannot take two of the formulary alternatives such as Anoro Ellipta and Bevespi  Aerosphere. Therefore, we are unable to approve your exception request. Please discuss the covered  alternative(s) with your prescriber.  Called and spoke with pt letting him know the response of the PA and he verbalized understanding. Stated to pt that we were going to check with MW about what he wanted to do due to the PA being denied as he has not tried/failed both Anoro or Bevespi.  Pt wanted to let MW know that the Stiolto inhaler had helped some with his breathing but he stated that he is able to function okay without having it.  Dr. Melvyn Novas, please advise on this.

## 2019-10-21 DIAGNOSIS — F419 Anxiety disorder, unspecified: Secondary | ICD-10-CM | POA: Diagnosis not present

## 2019-10-21 DIAGNOSIS — J449 Chronic obstructive pulmonary disease, unspecified: Secondary | ICD-10-CM | POA: Diagnosis not present

## 2019-10-21 DIAGNOSIS — I25119 Atherosclerotic heart disease of native coronary artery with unspecified angina pectoris: Secondary | ICD-10-CM | POA: Diagnosis not present

## 2019-10-21 DIAGNOSIS — E785 Hyperlipidemia, unspecified: Secondary | ICD-10-CM | POA: Diagnosis not present

## 2019-10-21 DIAGNOSIS — N1831 Chronic kidney disease, stage 3a: Secondary | ICD-10-CM | POA: Diagnosis not present

## 2019-10-21 DIAGNOSIS — I129 Hypertensive chronic kidney disease with stage 1 through stage 4 chronic kidney disease, or unspecified chronic kidney disease: Secondary | ICD-10-CM | POA: Diagnosis not present

## 2019-10-21 DIAGNOSIS — B351 Tinea unguium: Secondary | ICD-10-CM | POA: Diagnosis not present

## 2019-10-29 DIAGNOSIS — L84 Corns and callosities: Secondary | ICD-10-CM | POA: Diagnosis not present

## 2019-10-29 DIAGNOSIS — B351 Tinea unguium: Secondary | ICD-10-CM | POA: Diagnosis not present

## 2019-10-30 DIAGNOSIS — H6123 Impacted cerumen, bilateral: Secondary | ICD-10-CM | POA: Diagnosis not present

## 2019-11-04 ENCOUNTER — Ambulatory Visit: Payer: Medicare Other | Admitting: Family Medicine

## 2019-11-04 DIAGNOSIS — L820 Inflamed seborrheic keratosis: Secondary | ICD-10-CM | POA: Diagnosis not present

## 2019-11-11 DIAGNOSIS — Z23 Encounter for immunization: Secondary | ICD-10-CM | POA: Diagnosis not present

## 2019-12-09 ENCOUNTER — Ambulatory Visit: Payer: Medicare Other | Admitting: Internal Medicine

## 2019-12-13 ENCOUNTER — Ambulatory Visit (INDEPENDENT_AMBULATORY_CARE_PROVIDER_SITE_OTHER): Payer: Medicare Other | Admitting: Internal Medicine

## 2019-12-13 ENCOUNTER — Ambulatory Visit (HOSPITAL_COMMUNITY)
Admission: RE | Admit: 2019-12-13 | Discharge: 2019-12-13 | Disposition: A | Discharge status: Home / Self Care | Payer: Medicare Other | Source: Ambulatory Visit | Attending: Internal Medicine | Admitting: Internal Medicine

## 2019-12-13 ENCOUNTER — Other Ambulatory Visit: Payer: Self-pay

## 2019-12-13 ENCOUNTER — Other Ambulatory Visit (HOSPITAL_COMMUNITY)
Admission: RE | Admit: 2019-12-13 | Discharge: 2019-12-13 | Disposition: A | Discharge status: Home / Self Care | Payer: Medicare Other | Source: Ambulatory Visit | Attending: Internal Medicine | Admitting: Internal Medicine

## 2019-12-13 ENCOUNTER — Encounter: Payer: Self-pay | Admitting: Internal Medicine

## 2019-12-13 DIAGNOSIS — R053 Chronic cough: Secondary | ICD-10-CM

## 2019-12-13 DIAGNOSIS — J449 Chronic obstructive pulmonary disease, unspecified: Secondary | ICD-10-CM

## 2019-12-13 DIAGNOSIS — J90 Pleural effusion, not elsewhere classified: Secondary | ICD-10-CM | POA: Diagnosis not present

## 2019-12-13 DIAGNOSIS — I2510 Atherosclerotic heart disease of native coronary artery without angina pectoris: Secondary | ICD-10-CM | POA: Diagnosis not present

## 2019-12-13 DIAGNOSIS — R05 Cough: Secondary | ICD-10-CM | POA: Insufficient documentation

## 2019-12-13 LAB — CBC WITH DIFFERENTIAL/PLATELET
Abs Immature Granulocytes: 0.03 10*3/uL (ref 0.00–0.07)
Basophils Absolute: 0.1 10*3/uL (ref 0.0–0.1)
Basophils Relative: 1 %
Eosinophils Absolute: 0.3 10*3/uL (ref 0.0–0.5)
Eosinophils Relative: 2 %
HCT: 42.5 % (ref 39.0–52.0)
Hemoglobin: 13.7 g/dL (ref 13.0–17.0)
Immature Granulocytes: 0 %
Lymphocytes Relative: 11 %
Lymphs Abs: 1.5 10*3/uL (ref 0.7–4.0)
MCH: 31.6 pg (ref 26.0–34.0)
MCHC: 32.2 g/dL (ref 30.0–36.0)
MCV: 97.9 fL (ref 80.0–100.0)
Monocytes Absolute: 1.2 10*3/uL — ABNORMAL HIGH (ref 0.1–1.0)
Monocytes Relative: 9 %
Neutro Abs: 9.9 10*3/uL — ABNORMAL HIGH (ref 1.7–7.7)
Neutrophils Relative %: 77 %
Platelets: 249 10*3/uL (ref 150–400)
RBC: 4.34 MIL/uL (ref 4.22–5.81)
RDW: 13 % (ref 11.5–15.5)
WBC: 13 10*3/uL — ABNORMAL HIGH (ref 4.0–10.5)
nRBC: 0 % (ref 0.0–0.2)

## 2019-12-13 MED ORDER — PANTOPRAZOLE SODIUM 40 MG PO TBEC
40.0000 mg | DELAYED_RELEASE_TABLET | Freq: Every day | ORAL | 2 refills | Status: DC
Start: 2019-12-13 — End: 2020-01-21

## 2019-12-13 MED ORDER — FAMOTIDINE 20 MG PO TABS: ORAL_TABLET | ORAL | 11 refills | Status: DC

## 2019-12-13 MED ORDER — BREZTRI AEROSPHERE 160-9-4.8 MCG/ACT IN AERO
2.0000 | INHALATION_SPRAY | Freq: Two times a day (BID) | RESPIRATORY_TRACT | 0 refills | Status: DC
Start: 2019-12-13 — End: 2020-04-27

## 2019-12-13 NOTE — Patient Instructions (Addendum)
Breztri Take 2 puffs first thing in am and then another 2 puffs about 12 hours later x 2 weeks and stop and then restart bevespi only if notice deterioration  Pantoprazole (protonix) 40 mg   Take  30-60 min before first meal of the day and Pepcid (famotidine)  20 mg one after  Supper until return to office - this is the best way to tell whether stomach acid is contributing to your problem.    GERD (REFLUX)  is an extremely common cause of respiratory symptoms just like yours , many times with no obvious heartburn at all.    It can be treated with medication, but also with lifestyle changes including elevation of the head of your bed (ideally with 6 -8inch blocks under the headboard of your bed),  Smoking cessation, avoidance of late meals, excessive alcohol, and avoid fatty foods, chocolate, peppermint, colas, red wine, and acidic juices such as orange juice.  NO MINT OR MENTHOL PRODUCTS SO NO COUGH DROPS  USE SUGARLESS CANDY INSTEAD (Jolley ranchers or Stover's or Life Savers) or even ice chips will also do - the key is to swallow to prevent all throat clearing. NO OIL BASED VITAMINS - use powdered substitutes.  Avoid fish oil when coughing.     Please remember to go to the lab and x-ray department at [facility]   for your tests - we will call you with the results when they are available.      Please schedule a follow up office visit in 6 weeks, call sooner if needed

## 2019-12-13 NOTE — Progress Notes (Addendum)
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 Pulmonary Consult    Referred by Dr. [name] for eval of COPD. Former Dr [name] pt. He is maintained on spiriva and does not use a rescue inhaler. He states his breathing is doing well currently.    Dyspnea:  3 x weekly treadmill x 50 min at 3.6 -4 and 4.5  Cough: comes and goes x years variably productive not noct  Sleep: does fine in recliner / can do flat in bed  SABA use: none  rec Try off spiriva to see if you notice any difference in your exercise tolerance or cough For cough > mucinex dm up to 1200 mg every 12 hours as needed  Please schedule a follow up visit in 2 months at [place] office with PFT's first if possible   08/06/19  NP dx GOLD II barely   rec Activity as tolerated.  Would discuss with Cardiology that Lisinopril may be aggravating your cough .  Trial of Stiolto 2 puffs daily >  Helped some, too expensive > bevespi  May use Albuterol Inhaler 1-2 puffs every 6hrs as needed.    12/13/2019  f/u ov/[name] re:  GOLD II barely / maint on bevespi and off lisinopril  08/13/19 Chief Complaint  Patient presents with  . Follow-up    productive cough with thick, yellow phlegm  Dyspnea:  Treadmill x up to 55 min 4pm x 4%  Cough: ever since empyema surgery but changed early September 2021 better p zpak Sleeping: in recliner due to back / minimal cough  SABA use: none  02: none    No obvious day to day or daytime variability or assoc excess/ purulent sputum or mucus plugs or hemoptysis or cp or chest tightness, subjective wheeze or overt sinus or hb  symptoms.   Sleeping as above  without nocturnal  or early am exacerbation  of respiratory  c/o's or need for noct saba. Also denies any obvious fluctuation of symptoms with weather or environmental changes or other aggravating or alleviating factors except as outlined above   No unusual exposure hx or h/o childhood pna/ asthma or knowledge of premature birth.  Current Allergies, Complete Past Medical History, Past Surgical History, Family History, and Social History were reviewed in [employer] record.  ROS  The following are not active complaints unless bolded Hoarseness, sore throat, dysphagia, dental problems, itching, sneezing,  nasal congestion or discharge of excess mucus or purulent secretions, ear ache,   fever, chills, sweats, unintended wt loss or wt gain, classically pleuritic or exertional cp,  orthopnea pnd or arm/hand swelling  or leg swelling, presyncope, palpitations, abdominal pain, anorexia, nausea, vomiting, diarrhea  or change in bowel habits or change in bladder habits, change in stools or change in urine, dysuria, hematuria,  rash, arthralgias, visual complaints, headache, numbness, weakness or ataxia or problems with walking or coordination,  change in mood or  memory.        Current Meds  Medication Sig  . acetaminophen (TYLENOL) 325 MG tablet Take 650 mg  by mouth every 6 (six) hours as needed.  Marland Kitchen albuterol (PROAIR HFA) 108 (90 Base) MCG/ACT inhaler Inhale 1-2 puffs into the lungs every 6 (six) hours as needed for wheezing or shortness of breath.  Marland Kitchen aspirin EC 81 MG tablet Take 1 tablet (81 mg total) by mouth daily.  . Calcium Citrate-Vitamin D (CALCIUM CITRATE + D3 PO) Take 1 tablet by mouth daily.  . carvedilol (COREG) 6.25 MG tablet Take 1 tablet (6.25 mg total) by mouth 2 (two) times daily with a meal.  . Glycopyrrolate-Formoterol (BEVESPI AEROSPHERE) 9-4.8 MCG/ACT AERO Inhale 2 puffs into the lungs in the morning and at bedtime.  [name] Oil  350 MG CAPS Take 350 mg by mouth daily.  . Lactobacillus (ACIDOPHILUS PROBIOTIC) 10 MG TABS Take 10 mg by mouth daily.   Marland Kitchen losartan (COZAAR) 25 MG tablet Take 0.5 tablets (12.5 mg total) by mouth daily.  . Multiple Vitamins-Minerals (SENIOR VITES PO) Take 1 tablet by mouth daily.  . nitroGLYCERIN (NITROSTAT) 0.4 MG SL tablet Place 1 tablet (0.4 mg total) under the tongue every 5 (five) minutes as needed.  . rosuvastatin (CRESTOR) 40 MG tablet Take 1 tablet (40 mg total) by mouth daily.  . tamsulosin (FLOMAX) 0.4 MG CAPS capsule Take 1 capsule (0.4 mg total) by mouth daily.  . traZODone (DESYREL) 50 MG tablet Take 50 mg by mouth at bedtime as needed for sleep.                     Past Medical History:  Diagnosis Date  . Acute systolic heart failure ([place])   . COPD (chronic obstructive pulmonary disease) ([place])   . Enlarged prostate with lower urinary tract symptoms (LUTS)   . Hyperlipidemia   . Hypertension   . Low serum testosterone level   . PAF (paroxysmal atrial fibrillation) ([place])   . Pneumonia 05/2014  . Pyothorax without fistula ([place])   . Sciatica   . Shortness of breath dyspnea   . Solitary pulmonary nodule   . STEMI (ST elevation myocardial infarction) ([place])    09/27/17 PCI/DES x1 mLAD, PTCA of the diag branch. EF 30% Lifevest at discharge.   . Testicular hypofunction   . Urinary frequency       Objective:     Wt Readings from Last 3 Encounters:  12/13/19 192 lb 3.2 oz (87.2 kg)  08/06/19 198 lb (89.8 kg)  06/24/19 198 lb (89.8 kg)     Vital signs reviewed - Note on arrival 12/13/2019  02 sats  97% on RA      amb wm nad    HEENT : pt wearing mask not removed for exam due to covid -19 concerns.    NECK :  without JVD/Nodes/TM/ nl carotid upstrokes bilaterally   LUNGS: no acc muscle use,  Nl contour chest which is clear to A and P bilaterally with min cough on forced  exp maneuvers   CV:  RRR  no s3 or murmur or increase in P2, and no edema   ABD:   soft and nontender with nl inspiratory excursion in the supine position. No bruits or organomegaly appreciated, bowel sounds nl  MS:  Nl gait/ ext warm without deformities, calf tenderness, cyanosis or clubbing No obvious joint restrictions   SKIN: warm and dry without lesions    NEURO:  alert, approp, nl sensorium with  no motor or cerebellar deficits apparent.       CXR PA and Lateral:   12/13/2019 :  I personally reviewed images and agree with radiology impression as follows:   Persistent small right pleural effusion with associated right basilar opacity. My comment:  C/w prev empyema/ surgery       Assessment

## 2019-12-15 ENCOUNTER — Encounter: Payer: Self-pay | Admitting: Internal Medicine

## 2019-12-15 NOTE — Assessment & Plan Note (Addendum)
Onset 2016 assoc with empyema on R  - swallowing eval 11/2018  Oropharyngeal dysphagia s asp/ no restrictions  -off lisinopril  08/13/19  - 12/13/2019 empirically try gerd rx x 6 weeks, if not better consider sinus ct and hrxt looking for bronchiectasis  Discussed in detail all the  indications, usual  risks and alternatives  relative to the benefits with patient who agrees to proceed with Rx as outlined.             Each maintenance medication was reviewed in detail including emphasizing most importantly the difference between maintenance and prns and under what circumstances the prns are to be triggered using an action plan format where appropriate.  Total time for H and P, chart review, counseling, teaching device and generating customized AVS unique to this office visit / charting = 23 min

## 2019-12-15 NOTE — Assessment & Plan Note (Signed)
Quit all smoking 2016 with dx of R empyema with chronic cough since then - Try off spiriva 05/21/2019  - PFT's  08/06/19  FEV1 2.60 (75 % ) ratio 0.64  p 14 % improvement from saba p 0 prior to study with DLCO  18.09 (66%) corrects to 3.14 (79%)  for alv volume and FV curve minimally concave   - The proper method of use, as well as anticipated side effects, of a metered-dose inhaler were discussed and demonstrated to the patient using teach back method. Improved effectiveness after extensive coaching during this visit to a level of approximately 90 % from a baseline of 75 % so continue breztri 2 bid

## 2019-12-17 ENCOUNTER — Telehealth: Payer: Self-pay | Admitting: Internal Medicine

## 2019-12-17 LAB — IGE: IgE (Immunoglobulin E), Serum: 58 IU/mL (ref 6–495)

## 2019-12-17 NOTE — Telephone Encounter (Signed)
Merrilee Seashore, RN  12/17/2019 2:01 PM EDT     Called and spoke with patient about xray results per Dr Melvyn Novas. All questions answered and patient expressed full understanding. Nothing further needed at this time.

## 2019-12-17 NOTE — Progress Notes (Signed)
Called and spoke with patient about xray results per Dr Melvyn Novas. All questions answered and patient expressed full understanding. Nothing further needed at this time.

## 2019-12-18 ENCOUNTER — Encounter: Payer: Self-pay | Admitting: Cardiology

## 2019-12-18 ENCOUNTER — Encounter: Payer: Self-pay | Admitting: *Deleted

## 2019-12-18 ENCOUNTER — Ambulatory Visit (INDEPENDENT_AMBULATORY_CARE_PROVIDER_SITE_OTHER): Payer: Medicare Other | Admitting: Cardiology

## 2019-12-18 VITALS — BP 120/60 | HR 56 | Ht 72.0 in | Wt 193.8 lb

## 2019-12-18 DIAGNOSIS — I2510 Atherosclerotic heart disease of native coronary artery without angina pectoris: Secondary | ICD-10-CM | POA: Diagnosis not present

## 2019-12-18 DIAGNOSIS — I5022 Chronic systolic (congestive) heart failure: Secondary | ICD-10-CM | POA: Diagnosis not present

## 2019-12-18 DIAGNOSIS — E782 Mixed hyperlipidemia: Secondary | ICD-10-CM

## 2019-12-18 NOTE — Patient Instructions (Signed)
Medication Instructions:  Continue all current medications.   Labwork: none  Testing/Procedures: none  Follow-Up: 6 months   Any Other Special Instructions Will Be Listed Below (If Applicable).   If you need a refill on your cardiac medications before your next appointment, please call your pharmacy.  

## 2019-12-18 NOTE — Progress Notes (Signed)
Clinical Summary [name] [name] is a 74 y.o.male seen today for follow up of the following medical problems.   1. CAD/ICM/Chronic systolic HF - admit 07/5730 with STEMI. Received DES toLAD, PTCA of diag - 09/2017 echo LVEF 30-35%. He was discharged with lifevest for primary prevention - repeat 10/2017 35-40%, lifevest discontinued - given afib was started on plavix, asa, and anticoag x 1 month, then plavix and anticoag.  - medical therapyinitiallylimited due to soft bp's andpriorrenal dysfunction.   02/2019 echo: LVEF 20-25%, grade I diasotlic dysfunction - no recent symptoms   2. COPD/Chronic cough - followed by Dr Melvyn Novas    3. Afibin setting of STEMI - new diagnosis during his admission with acute MI. Unclear if isolated in setting of MI 02/2018 event monitor without recurrent afib, eliquis was stopped  - denies any palpitaitons   4. Hyperlipidemia  10/2018 TC 109 TC 121 HDL 39 LDL 63 - remains compliant with statin    Has completed covid vaccine x 2, last shot 05/08/19 Spends a lot of time at [facility] where he has a place.   Moderna vaccine x 2.    Past Medical History:  Diagnosis Date  . Acute systolic heart failure ([place])   . COPD (chronic obstructive pulmonary disease) ([place])   . Enlarged prostate with lower urinary tract symptoms (LUTS)   . Hyperlipidemia   . Hypertension   . Low serum testosterone level   . PAF (paroxysmal atrial fibrillation) ([place])   . Pneumonia 05/2014  . Pyothorax without fistula ([place])   . Sciatica   . Shortness of breath dyspnea   . Solitary pulmonary nodule   . STEMI (ST elevation myocardial infarction) ([place])    09/27/17 PCI/DES x1 mLAD, PTCA of the diag [place]. EF 30% Lifevest at discharge.   . Testicular hypofunction   . Urinary frequency      No Known Allergies   Current Outpatient Medications  Medication Sig Dispense Refill  . acetaminophen (TYLENOL) 325 MG tablet Take 650 mg by mouth every 6  (six) hours as needed.    Marland Kitchen albuterol (PROAIR HFA) 108 (90 Base) MCG/ACT inhaler Inhale 1-2 puffs into the lungs every 6 (six) hours as needed for wheezing or shortness of breath. 1 g 2  . aspirin EC 81 MG tablet Take 1 tablet (81 mg total) by mouth daily. 90 tablet 3  . Budeson-Glycopyrrol-Formoterol (BREZTRI AEROSPHERE) 160-9-4.8 MCG/ACT AERO Inhale 2 puffs into the lungs in the morning and at bedtime. 10.7 g 0  . Calcium Citrate-Vitamin D (CALCIUM CITRATE + D3 PO) Take 1 tablet by mouth daily.    . carvedilol (COREG) 6.25 MG tablet Take 1 tablet (6.25 mg total) by mouth 2 (two) times daily with a meal. 180 tablet 3  . famotidine (PEPCID) 20 MG tablet One after supper 30 tablet 11  . Lactobacillus (ACIDOPHILUS PROBIOTIC) 10 MG TABS Take 10 mg by mouth daily.     Marland Kitchen losartan (COZAAR) 25 MG tablet Take 0.5 tablets (12.5 mg total) by mouth daily. 45 tablet 3  . Multiple Vitamins-Minerals (SENIOR VITES PO) Take 1 tablet by mouth daily.    . nitroGLYCERIN (NITROSTAT) 0.4 MG SL tablet Place 1 tablet (0.4 mg total) under the tongue every 5 (five) minutes as needed. 25 tablet 2  . pantoprazole (PROTONIX) 40 MG tablet Take 1 tablet (40 mg total) by mouth daily. Take 30-60 min before first meal of the day 30 tablet 2  . rosuvastatin (CRESTOR) 40 MG tablet Take  1 tablet (40 mg total) by mouth daily. 90 tablet 3  . tamsulosin (FLOMAX) 0.4 MG CAPS capsule Take 1 capsule (0.4 mg total) by mouth daily. 30 capsule 2  . traZODone (DESYREL) 50 MG tablet Take 50 mg by mouth at bedtime as needed for sleep.     No current facility-administered medications for this visit.     Past Surgical History:  Procedure Laterality Date  . APPENDECTOMY    . BACK SURGERY    . COLONOSCOPY  2011   Tubular adenoma per patient  . COLONOSCOPY N/A 03/03/2015   Dr. Oneida Alar: Left colon is redundant, mild diverticulosis, small internal hemorrhoids, moderate external hemorrhoids.  Next colonoscopy in 5 years with overtube.  .  COLONOSCOPY N/A 03/25/2019   Procedure: COLONOSCOPY;  Surgeon: Danie Binder, MD;  [facility];  Service: Endoscopy;  Laterality: N/A;  8:30am - Camille's request  . CORONARY/GRAFT ACUTE MI REVASCULARIZATION N/A 09/27/2017   Procedure: Coronary/Graft Acute MI Revascularization;  Surgeon: Burnell Blanks, MD;  Location: [place] [facility];  Service: Cardiovascular;  Laterality: N/A;  . EMPYEMA DRAINAGE Right 06/27/2014   Procedure: EMPYEMA DRAINAGE;  Surgeon: Grace Isaac, MD;  Location: [place];  Service: Thoracic;  Laterality: Right;  . HEMORRHOID BANDING N/A 03/03/2015   Procedure: HEMORRHOID BANDING;  Surgeon: Danie Binder, MD;  [facility];  Service: Endoscopy;  Laterality: N/A;  Recomended to see a Psychologist, sport and exercise.  [name]    . INTRAVASCULAR ULTRASOUND/IVUS N/A 09/27/2017   Procedure: Intravascular Ultrasound/IVUS;  Surgeon: Burnell Blanks, MD;  Location: [place] [facility];  Service: Cardiovascular;  Laterality: N/A;  . LEFT HEART CATH AND CORONARY ANGIOGRAPHY N/A 09/27/2017   Procedure: LEFT HEART CATH AND CORONARY ANGIOGRAPHY;  Surgeon: Burnell Blanks, MD;  Location: [place] [facility];  Service: Cardiovascular;  Laterality: N/A;  . LUNG SURGERY  2016  . POLYPECTOMY  03/25/2019   Procedure: POLYPECTOMY;  Surgeon: Danie Binder, MD;  [facility];  Service: Endoscopy;;  . TEE WITHOUT CARDIOVERSION N/A 07/14/2014   Procedure: TRANSESOPHAGEAL ECHOCARDIOGRAM (TEE);  Surgeon: Sueanne Margarita, MD;  Location: [facility];  Service: Cardiovascular;  Laterality: N/A;  . VARICOSE VEIN SURGERY    . VIDEO ASSISTED THORACOSCOPY Right 06/27/2014   Procedure: VIDEO ASSISTED THORACOSCOPY;  Surgeon: Grace Isaac, MD;  Location: [place];  Service: Thoracic;  Laterality: Right;  Marland Kitchen VIDEO BRONCHOSCOPY N/A 06/27/2014   Procedure: VIDEO BRONCHOSCOPY;  Surgeon: Grace Isaac, MD;  [facility];  Service: Thoracic;  Laterality: N/A;     No  Known Allergies    Family History  Problem Relation Age of Onset  . Tuberculosis Father   . Hypertension Father   . COPD Mother   . Hypertension Mother   . Colon cancer Neg Hx      Social History Mr. Barney reports that he quit smoking about 5 years ago. His smoking use included pipe and cigarettes. He has a 10.00 pack-year smoking history. He has never used smokeless tobacco. Mr. Moder reports current alcohol use.   Review of Systems CONSTITUTIONAL: No weight loss, fever, chills, weakness or fatigue.  HEENT: Eyes: No visual loss, blurred vision, double vision or yellow sclerae.No hearing loss, sneezing, congestion, runny nose or sore throat.  SKIN: No rash or itching.  CARDIOVASCULAR: per hpi RESPIRATORY: No shortness of breath, cough or sputum.  GASTROINTESTINAL: No anorexia, nausea, vomiting or diarrhea. No abdominal pain or blood.  GENITOURINARY: No  burning on urination, no polyuria NEUROLOGICAL: No headache, dizziness, syncope, paralysis, ataxia, numbness or tingling in the extremities. No change in bowel or bladder control.  MUSCULOSKELETAL: No muscle, back pain, joint pain or stiffness.  LYMPHATICS: No enlarged nodes. No history of splenectomy.  PSYCHIATRIC: No history of depression or anxiety.  ENDOCRINOLOGIC: No reports of sweating, cold or heat intolerance. No polyuria or polydipsia.  Marland Kitchen   Physical Examination Today's Vitals   12/18/19 1004  BP: 120/60  Pulse: (!) 56  SpO2: 98%  Weight: 193 lb 12.8 oz (87.9 kg)  Height: 6' (1.829 m)   Body mass index is 26.28 kg/m.  Gen: resting comfortably, no acute distress HEENT: no scleral icterus, pupils equal round and reactive, no palptable cervical adenopathy,  CV: RRR, no m/r/g, no jvd Resp: Clear to auscultation bilaterally GI: abdomen is soft, non-tender, non-distended, normal bowel sounds, no hepatosplenomegaly MSK: extremities are warm, no edema.  Skin: warm, no rash Neuro:  no focal deficits Psych:  appropriate affect   Diagnostic Studies 09/2017 cath  Prox RCA lesion is 30% stenosed.  Prox LAD lesion is 100% stenosed.  Prox Cx lesion is 20% stenosed.  A drug-eluting stent was successfully placed using a STENT SYNERGY DES 3X16.  Post intervention, there is a 0% residual stenosis.  Ost 1st Diag lesion is 100% stenosed.  Balloon angioplasty was performed using a BALLOON SAPPHIRE 2.5X12.  Post intervention, there is a 40% residual stenosis.  Prox LAD to Mid LAD lesion is 40% stenosed.  1. Acute anterior STEMI secondary to occlusion of the mid LAD 2. Successful PTCA/DES x 1 mid LAD 3. Successful PTCA/angioplasty ostium of the Diagonal [name] [name].  4. Mild non-obstructive disease in the RCA, circumflex and mid LAD  Post cathRecommendations:  Will admit to the ICU and continue Aggrastat for 6 hours. Will start high intensity statin and a beta blocker. Echo later today.  . Recommend uninterrupted dual antiplatelet therapy with Aspirin 81mg  daily and Ticagrelor 90mg  twice dailyfor a minimum of 12 months (ACS - Class I recommendation).   09/2017 echo Study Conclusions  - Left ventricle: The cavity size was normal. There was mild focal basal hypertrophy of the septum. Systolic function was moderately to severely reduced. The estimated ejection fraction was in the range of 30% to 35%. Severe hypokinesis of the mid to apical anteroseptal, anterior, and anterolateral myocardium. Dyskinetic at apex. Doppler parameters are consistent with abnormal left ventricular relaxation (grade 1 diastolic dysfunction). - Aortic valve: Trileaflet; mildly thickened, mildly calcified leaflets. - Mitral valve: Calcified annulus. There was trivial regurgitation. - Pulmonary arteries: PA peak pressure: 41 mm Hg (S). - Pericardium, extracardiac: A small pericardial effusion was identified.  Impressions:  - New reduction in LV EF with regional wall motion abnormalities  in the mid to apical anteroseptum, anterior, and anterolateral walls with dyskinesis at the apex. Small pericardial effusion noted.  10/2017 echo Study Conclusions  - Left ventricle: The cavity size was normal. Wall thickness was increased in a pattern of moderate LVH. Systolic function was moderately reduced. The estimated ejection fraction was in the range of 35% to 40%. Doppler parameters are consistent with abnormal left ventricular relaxation (grade 1 diastolic dysfunction). Doppler parameters are consistent with high ventricular filling pressure. - Regional wall motion abnormality: Hypokinesis of the apical septal, apical lateral, and apical myocardium. - Aortic valve: Mildly calcified annulus. Trileaflet; mildly thickened leaflets. Valve area (VTI): 4.91 cm^2. Valve area (Vmax): 4.27 cm^2. Valve area (Vmean): 3.54 cm^2. - Mitral valve: Mildly calcified  annulus. Mildly thickened leaflets . - Left atrium: The atrium was moderately dilated. - Pericardium, extracardiac: There is a small circumferential pericardial effusion. The effusion measures 0.8 cm adjacent to the LV. There is no evidence of tamponade physiology. - Technically adequate study.   02/2018 heart monitor  21 day event monitor  Min HR 48, Max HR 130, Avg HR 71. Min HR occurred in early AM hours presumably while sleeping  No symptoms reported  4 beat run of NSVT that was asymptomatic   02/2019 echo  IMPRESSIONS   1. Left ventricular ejection fraction, by visual estimation, is 40 to 45%. The left ventricle has mildly decreased function. There is moderately increased left ventricular hypertrophy. 2. Elevated left atrial pressure. 3. Left ventricular diastolic parameters are consistent with Grade I diastolic dysfunction (impaired relaxation). 4. Hypokinesis of the apex, mid to distal anteroseptal, apical lateral, and apical myocardium. 5. Global right ventricle has  normal systolic function.The right ventricular size is normal. No increase in right ventricular wall thickness. 6. Left atrial size was moderately dilated. 7. Right atrial size was normal. 8. The mitral valve is normal in structure. Mild mitral valve regurgitation. No evidence of mitral stenosis. 9. The tricuspid valve is normal in structure. Tricuspid valve regurgitation is not demonstrated. 10. The aortic valve is tricuspid. Aortic valve regurgitation is not visualized. No evidence of aortic valve sclerosis or stenosis. 11. The pulmonic valve was not well visualized. Pulmonic valve regurgitation is not visualized. 12. The inferior vena cava is normal in size with greater than 50% respiratory variability, suggesting right atrial pressure of 3 mmHg.    Assessment and Plan  1. CAD/ICM/chronic systolic HF - no symptoms, continue current meds  2. Hyperlipidemia - continue statin     Arnoldo Lenis, M.D.

## 2019-12-27 NOTE — Telephone Encounter (Signed)
Please send to Dr. Melvyn Novas, he can get messages if on [place]

## 2019-12-27 NOTE — Telephone Encounter (Signed)
MW please advise on recs.

## 2019-12-27 NOTE — Telephone Encounter (Signed)
Called and spoke with patient d/t the nature of his s/s:  Pt was changed from [place] to [place] on 9/24 OV and has been doing well on this.  Pt is currently traveling, is on the road up to [place] and will be returning on Tuesday.  Pt states he has a prod cough with yellow mucus at baseline, but noticed X1.5 days that his mucus has had a pink/red tinge to it.  States he is not coughing up blood and his mucus is not streaked or foamy, describes it as a moderate amount, unchanged from his normal mucus production amount just the color is different.    Denies chest trauma, fever, sinus congestion, PND, loss of taste/smell, headaches.    Pt is requesting recs- states that he does not feel unwell but wants to make sure this does not need immediate attention before he returns home on Tuesday.    Sending to APP since MW is in hospital today.  Beth please advise, thanks!

## 2020-01-10 DIAGNOSIS — Z23 Encounter for immunization: Secondary | ICD-10-CM | POA: Diagnosis not present

## 2020-01-21 ENCOUNTER — Encounter: Payer: Self-pay | Admitting: Internal Medicine

## 2020-01-21 ENCOUNTER — Other Ambulatory Visit: Payer: Self-pay

## 2020-01-21 ENCOUNTER — Ambulatory Visit (INDEPENDENT_AMBULATORY_CARE_PROVIDER_SITE_OTHER): Payer: Medicare Other | Admitting: Internal Medicine

## 2020-01-21 DIAGNOSIS — J449 Chronic obstructive pulmonary disease, unspecified: Secondary | ICD-10-CM

## 2020-01-21 DIAGNOSIS — R059 Cough, unspecified: Secondary | ICD-10-CM | POA: Diagnosis not present

## 2020-01-21 DIAGNOSIS — I2510 Atherosclerotic heart disease of native coronary artery without angina pectoris: Secondary | ICD-10-CM | POA: Diagnosis not present

## 2020-01-21 DIAGNOSIS — R053 Chronic cough: Secondary | ICD-10-CM | POA: Diagnosis not present

## 2020-01-21 NOTE — Assessment & Plan Note (Signed)
Quit all smoking 2016 with dx of R empyema with chronic cough since then - Try off spiriva 05/21/2019  - PFT's  08/06/19  FEV1 2.60 (75 % ) ratio 0.64  p 14 % improvement from saba p 0 prior to study with DLCO  18.09 (66%) corrects to 3.14 (79%)  for alv volume and FV curve minimally concave   No change on maint rx so no need for any unless having aecopd but if so will use low dose ICS due to concern with bronchiectasis

## 2020-01-21 NOTE — Progress Notes (Signed)
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 Pulmonary Consult    Referred by Dr. [name] for eval of COPD. Former Dr [name] pt. He is maintained on spiriva and does not use a rescue inhaler. He states his breathing is doing well currently.    Dyspnea:  3 x weekly treadmill x 50 min at 3.6 -4 and 4.5  Cough: comes and goes x years variably productive not noct  Sleep: does fine in recliner / can do flat in bed  SABA use: none  rec Try off spiriva to see if you notice any difference in your exercise tolerance or cough For cough > mucinex dm up to 1200 mg every 12 hours as needed  Please schedule a follow up visit in 2 months at [place] office with PFT's first if possible   08/06/19  NP dx GOLD II barely   rec Activity as tolerated.  Would discuss with Cardiology that Lisinopril may be aggravating your cough .  Trial of Stiolto 2 puffs daily >  Helped some, too expensive > bevespi  May use Albuterol Inhaler 1-2 puffs every 6hrs as needed.    12/13/2019  f/u ov/Kym Fenter re:  GOLD II barely / maint on bevespi and off lisinopril  08/13/19 Chief Complaint  Patient presents with  . Follow-up    productive cough with thick, yellow phlegm  Dyspnea:  Treadmill x up to 55 min 4pm x 4%  Cough: ever since empyema surgery but changed early September 2021 better p zpak Sleeping: in recliner due to back / minimal cough  SABA use: none  02: none  rec Breztri Take 2 puffs first thing in am and then another 2 puffs about 12 hours later x 2 weeks and stop and then restart bevespi only if notice deterioration Pantoprazole  (protonix) 40 mg   Take  30-60 min before first meal of the day and Pepcid (famotidine)  20 mg one after  Supper until return to office  GERD diet  Please remember to go to the lab and x-ray department at [facility]   for your tests - we will call you with the results when they are available. Please schedule a follow up office visit in 6 weeks, call sooner if needed    01/21/2020  f/u ov/[name] re: COPD GOLD II /  cough x 2016 when dx with R empyema  Chief Complaint  Patient presents with  . Follow-up    productive cough with pinkish color phlegm  Dyspnea:  No change ex tol treadmill on or off breztri vs bevespi vs nothin  Cough: actually better while on treadmill/ still pink tinged variably on baby asa daily  Sleeping: worse on L side down  SABA use: none  02: none    No obvious day to day or daytime variability or assoc excess/ purulent sputum or mucus plugs or   cp or chest tightness, subjective wheeze or overt sinus or hb symptoms.     Also denies any obvious fluctuation of symptoms with weather or environmental changes or other aggravating  or alleviating factors except as outlined above   No unusual exposure hx or h/o childhood pna/ asthma or knowledge of premature birth.  Current Allergies, Complete Past Medical History, Past Surgical History, Family History, and Social History were reviewed in [employer] record.  ROS  The following are not active complaints unless bolded Hoarseness, sore throat, dysphagia, dental problems, itching, sneezing,  nasal congestion or discharge of excess mucus or purulent secretions, ear ache,   fever, chills, sweats, unintended wt loss or wt gain, classically pleuritic or exertional cp,  orthopnea pnd or arm/hand swelling  or leg swelling, presyncope, palpitations, abdominal pain, anorexia, nausea, vomiting, diarrhea  or change in bowel habits or change in bladder habits, change in stools or change in urine, dysuria,  hematuria,  rash, arthralgias, visual complaints, headache, numbness, weakness or ataxia or problems with walking or coordination,  change in mood or  memory.        Current Meds  Medication Sig  . acetaminophen (TYLENOL) 325 MG tablet Take 650 mg by mouth every 6 (six) hours as needed.  Marland Kitchen albuterol (PROAIR HFA) 108 (90 Base) MCG/ACT inhaler Inhale 1-2 puffs into the lungs every 6 (six) hours as needed for wheezing or shortness of breath.  Marland Kitchen aspirin EC 81 MG tablet Take 1 tablet (81 mg total) by mouth daily.  . Calcium Citrate-Vitamin D (CALCIUM CITRATE + D3 PO) Take 1 tablet by mouth daily.  . carvedilol (COREG) 6.25 MG tablet Take 1 tablet (6.25 mg total) by mouth 2 (two) times daily with a meal.  . famotidine (PEPCID) 20 MG tablet One after supper (Patient taking differently: Take 20 mg by mouth daily. One after supper)  . Multiple Vitamins-Minerals (SENIOR VITES PO) Take 1 tablet by mouth daily.  . nitroGLYCERIN (NITROSTAT) 0.4 MG SL tablet Place 1 tablet (0.4 mg total) under the tongue every 5 (five) minutes as needed.  . pantoprazole (PROTONIX) 40 MG tablet Take 1 tablet (40 mg total) by mouth daily. Take 30-60 min before first meal of the day  . rosuvastatin (CRESTOR) 40 MG tablet Take 1 tablet (40 mg total) by mouth daily.  . tamsulosin (FLOMAX) 0.4 MG CAPS capsule Take 1 capsule (0.4 mg total) by mouth daily.  . traZODone (DESYREL) 50 MG tablet Take 50 mg by mouth at bedtime as needed for sleep.                      Past Medical History:  Diagnosis Date  . Acute systolic heart failure ([place])   . COPD (chronic obstructive pulmonary disease) ([place])   . Enlarged prostate with lower urinary tract symptoms (LUTS)   . Hyperlipidemia   . Hypertension   . Low serum testosterone level   . PAF (paroxysmal atrial fibrillation) ([place])   . Pneumonia 05/2014  . Pyothorax without fistula ([place])   . Sciatica   . Shortness of breath dyspnea   . Solitary pulmonary nodule   . STEMI (ST  elevation myocardial infarction) ([place])    09/27/17 PCI/DES x1 mLAD, PTCA of the diag branch. EF 30% Lifevest at discharge.   . Testicular hypofunction   . Urinary frequency       Objective:     01/21/2020        192   12/13/19 192 lb 3.2 oz (87.2 kg)  08/06/19 198 lb (89.8 kg)  06/24/19 198 lb (89.8 kg)      Vital signs reviewed  01/21/2020  - Note at rest  02 sats  99% on RA         pleasant amb wm nad still coughs on exp   HEENT : pt wearing mask not removed for exam due to covid - 19 concerns.   NECK :  without JVD/Nodes/TM/ nl carotid upstrokes bilaterally   LUNGS: no acc muscle use,  Min barrel  contour chest wall with bilateral  slightly decreased bs esp R base s audible wheeze and  without cough on insp   maneuvers and min  Hyperresonant  to  percussion bilaterally     CV:  RRR  no s3 or murmur or increase in P2, and no edema   ABD:  soft and nontender with pos end  insp Hoover's  in the supine position. No bruits or organomegaly appreciated, bowel sounds nl  MS:   Nl gait/  ext warm without deformities, calf tenderness, cyanosis or clubbing No obvious joint restrictions   SKIN: warm and dry without lesions    NEURO:  alert, approp, nl sensorium with  no motor or cerebellar deficits apparent.                    Assessment

## 2020-01-21 NOTE — Assessment & Plan Note (Addendum)
Onset 2016 assoc with empyema on R  - swallowing eval 11/2018  Oropharyngeal dysphagia s asp/ no restrictions  - off lisinopril  08/13/19 - Allergy profile 12/13/19  >  Eos 0.3 /  IgE  58 - 12/13/2019 empirically try gerd rx x 6 weeks, if not better consider sinus ct and hrxt looking for bronchiectasis  -   HRCT and Sinus CT ordered 01/21/2020  And d/c gerd rx as not helping   Cough on exp maneuvers with h/o empyema and worse  L side down is suggestive of sinusitis/ bronchiectasis so rec  1) zpak for flare /change in mucus  2) proceed with scans 3) hold asa for active bleeding           Each maintenance medication was reviewed in detail including emphasizing most importantly the difference between maintenance and prns and under what circumstances the prns are to be triggered using an action plan format where appropriate.  Total time for H and P, chart review, counseling,   and generating customized AVS unique to this office visit / charting = 23 min

## 2020-01-21 NOTE — Patient Instructions (Signed)
For changed mucus from your normal go ahead and use zpak   If mucus gets really bloody, stop the baby aspirin until better   [facility] to stop the reflux meds  - you may need to take pepcid 20 mg twice daily x one week then one daily x week and stop   We will call to schedule your CT chest and sinus and the results   Please schedule a follow up visit in 3 months but call sooner if needed

## 2020-01-23 ENCOUNTER — Other Ambulatory Visit: Payer: Self-pay

## 2020-01-23 MED ORDER — ROSUVASTATIN CALCIUM 40 MG PO TABS
40.0000 mg | ORAL_TABLET | Freq: Every day | ORAL | 3 refills | Status: DC
Start: 2020-01-23 — End: 2021-02-02

## 2020-01-24 ENCOUNTER — Ambulatory Visit: Payer: Medicare Other | Admitting: Internal Medicine

## 2020-01-29 ENCOUNTER — Telehealth: Payer: Self-pay | Admitting: Cardiology

## 2020-01-29 MED ORDER — CARVEDILOL 6.25 MG PO TABS
6.2500 mg | ORAL_TABLET | Freq: Two times a day (BID) | ORAL | 3 refills | Status: DC
Start: 2020-01-29 — End: 2021-02-02

## 2020-01-29 NOTE — Telephone Encounter (Signed)
Refill complete 

## 2020-01-29 NOTE — Telephone Encounter (Signed)
New message     *STAT* If patient is at the pharmacy, call can be transferred to refill team.   1. Which medications need to be refilled? (please list name of each medication and dose if known) carvedilol (COREG) 6.25 MG tablet  2. Which pharmacy/location (including street and city if local pharmacy) is medication to be sent to? [facility] on [address]   3. Do they need a 30 day or 90 day supply? [place]

## 2020-02-03 DIAGNOSIS — M25521 Pain in right elbow: Secondary | ICD-10-CM | POA: Diagnosis not present

## 2020-02-03 DIAGNOSIS — F5105 Insomnia due to other mental disorder: Secondary | ICD-10-CM | POA: Diagnosis not present

## 2020-02-17 ENCOUNTER — Ambulatory Visit: Payer: Medicare Other | Admitting: Internal Medicine

## 2020-02-18 ENCOUNTER — Other Ambulatory Visit: Payer: Self-pay

## 2020-02-18 ENCOUNTER — Ambulatory Visit (HOSPITAL_COMMUNITY): Admission: RE | Admit: 2020-02-18 | Payer: Medicare Other | Source: Ambulatory Visit

## 2020-02-18 ENCOUNTER — Ambulatory Visit (HOSPITAL_COMMUNITY)
Admission: RE | Admit: 2020-02-18 | Discharge: 2020-02-18 | Disposition: A | Discharge status: Home / Self Care | Payer: Medicare Other | Source: Ambulatory Visit | Attending: Internal Medicine | Admitting: Internal Medicine

## 2020-02-18 DIAGNOSIS — R059 Cough, unspecified: Secondary | ICD-10-CM | POA: Diagnosis not present

## 2020-02-18 DIAGNOSIS — R053 Chronic cough: Secondary | ICD-10-CM | POA: Diagnosis not present

## 2020-02-19 ENCOUNTER — Encounter: Payer: Self-pay | Admitting: Internal Medicine

## 2020-02-19 DIAGNOSIS — J479 Bronchiectasis, uncomplicated: Secondary | ICD-10-CM | POA: Insufficient documentation

## 2020-02-19 NOTE — Progress Notes (Signed)
Discussed with patient by phone  - dx bronchiectasis with likely MAI  but he reports actually doing much better in terms of resp symptoms  s  am mucus so rec mucinex or mucinex dm up to 1200mg  twice daily and call if mucus changes for am sputum sample and short course therapy eg zpak with ID eval if pos for MAI or consider bronchoscopy later.  Keep f/u office visit planned and will train on flutter, check alpha one and Quant Igs then

## 2020-02-20 NOTE — Telephone Encounter (Signed)
These symptoms do not sound heart related, I would continue to follow up with pcp. If they feel there is a cardiac issue then we would be happy to evaluate  Zandra Abts MD

## 2020-02-22 DIAGNOSIS — R053 Chronic cough: Secondary | ICD-10-CM

## 2020-02-24 NOTE — Telephone Encounter (Signed)
Needs am sputum (want quality over quantity so not a bunch of spit but a little dark mucus is what we're after)  X 1 day only for afb stain and culture and routine stain and culture thru lab a [facility]

## 2020-02-25 DIAGNOSIS — F5105 Insomnia due to other mental disorder: Secondary | ICD-10-CM | POA: Diagnosis not present

## 2020-02-25 DIAGNOSIS — F064 Anxiety disorder due to known physiological condition: Secondary | ICD-10-CM | POA: Diagnosis not present

## 2020-02-25 DIAGNOSIS — F332 Major depressive disorder, recurrent severe without psychotic features: Secondary | ICD-10-CM | POA: Diagnosis not present

## 2020-03-03 ENCOUNTER — Encounter: Payer: Self-pay | Admitting: *Deleted

## 2020-03-03 DIAGNOSIS — B351 Tinea unguium: Secondary | ICD-10-CM | POA: Diagnosis not present

## 2020-03-04 DIAGNOSIS — H43813 Vitreous degeneration, bilateral: Secondary | ICD-10-CM | POA: Diagnosis not present

## 2020-03-16 DIAGNOSIS — R195 Other fecal abnormalities: Secondary | ICD-10-CM | POA: Diagnosis not present

## 2020-03-16 DIAGNOSIS — R0602 Shortness of breath: Secondary | ICD-10-CM | POA: Diagnosis not present

## 2020-03-16 DIAGNOSIS — F332 Major depressive disorder, recurrent severe without psychotic features: Secondary | ICD-10-CM | POA: Diagnosis not present

## 2020-03-16 DIAGNOSIS — K5900 Constipation, unspecified: Secondary | ICD-10-CM | POA: Diagnosis not present

## 2020-03-16 DIAGNOSIS — F4323 Adjustment disorder with mixed anxiety and depressed mood: Secondary | ICD-10-CM | POA: Diagnosis not present

## 2020-03-16 DIAGNOSIS — F064 Anxiety disorder due to known physiological condition: Secondary | ICD-10-CM | POA: Diagnosis not present

## 2020-03-16 DIAGNOSIS — F5105 Insomnia due to other mental disorder: Secondary | ICD-10-CM | POA: Diagnosis not present

## 2020-03-17 DIAGNOSIS — R195 Other fecal abnormalities: Secondary | ICD-10-CM | POA: Diagnosis not present

## 2020-03-17 DIAGNOSIS — K5900 Constipation, unspecified: Secondary | ICD-10-CM | POA: Diagnosis not present

## 2020-03-17 DIAGNOSIS — M25521 Pain in right elbow: Secondary | ICD-10-CM | POA: Diagnosis not present

## 2020-03-17 DIAGNOSIS — F332 Major depressive disorder, recurrent severe without psychotic features: Secondary | ICD-10-CM | POA: Diagnosis not present

## 2020-03-17 DIAGNOSIS — R0602 Shortness of breath: Secondary | ICD-10-CM | POA: Diagnosis not present

## 2020-03-17 DIAGNOSIS — F064 Anxiety disorder due to known physiological condition: Secondary | ICD-10-CM | POA: Diagnosis not present

## 2020-03-17 DIAGNOSIS — F5105 Insomnia due to other mental disorder: Secondary | ICD-10-CM | POA: Diagnosis not present

## 2020-03-23 DIAGNOSIS — F4323 Adjustment disorder with mixed anxiety and depressed mood: Secondary | ICD-10-CM | POA: Diagnosis not present

## 2020-03-30 ENCOUNTER — Other Ambulatory Visit (HOSPITAL_COMMUNITY)
Admission: RE | Admit: 2020-03-30 | Discharge: 2020-03-30 | Disposition: A | Discharge status: Home / Self Care | Payer: Medicare Other | Source: Ambulatory Visit | Attending: Internal Medicine | Admitting: Internal Medicine

## 2020-03-30 ENCOUNTER — Other Ambulatory Visit: Payer: Self-pay

## 2020-03-30 DIAGNOSIS — R053 Chronic cough: Secondary | ICD-10-CM | POA: Diagnosis not present

## 2020-03-30 LAB — EXPECTORATED SPUTUM ASSESSMENT W GRAM STAIN, RFLX TO RESP C

## 2020-04-01 LAB — CULTURE, RESPIRATORY W GRAM STAIN: Culture: NORMAL

## 2020-04-02 LAB — ACID FAST SMEAR (AFB, MYCOBACTERIA): Acid Fast Smear: NEGATIVE

## 2020-04-03 NOTE — Progress Notes (Signed)
Spoke with pt and notified of results per Dr. Wert. Pt verbalized understanding and denied any questions. 

## 2020-04-06 DIAGNOSIS — F4323 Adjustment disorder with mixed anxiety and depressed mood: Secondary | ICD-10-CM | POA: Diagnosis not present

## 2020-04-09 DIAGNOSIS — R0602 Shortness of breath: Secondary | ICD-10-CM | POA: Diagnosis not present

## 2020-04-09 DIAGNOSIS — F064 Anxiety disorder due to known physiological condition: Secondary | ICD-10-CM | POA: Diagnosis not present

## 2020-04-09 DIAGNOSIS — F5105 Insomnia due to other mental disorder: Secondary | ICD-10-CM | POA: Diagnosis not present

## 2020-04-09 DIAGNOSIS — F332 Major depressive disorder, recurrent severe without psychotic features: Secondary | ICD-10-CM | POA: Diagnosis not present

## 2020-04-09 DIAGNOSIS — R195 Other fecal abnormalities: Secondary | ICD-10-CM | POA: Diagnosis not present

## 2020-04-09 DIAGNOSIS — M25521 Pain in right elbow: Secondary | ICD-10-CM | POA: Diagnosis not present

## 2020-04-09 DIAGNOSIS — K5900 Constipation, unspecified: Secondary | ICD-10-CM | POA: Diagnosis not present

## 2020-04-13 DIAGNOSIS — F4323 Adjustment disorder with mixed anxiety and depressed mood: Secondary | ICD-10-CM | POA: Diagnosis not present

## 2020-04-20 DIAGNOSIS — F332 Major depressive disorder, recurrent severe without psychotic features: Secondary | ICD-10-CM | POA: Diagnosis not present

## 2020-04-20 DIAGNOSIS — R195 Other fecal abnormalities: Secondary | ICD-10-CM | POA: Diagnosis not present

## 2020-04-20 DIAGNOSIS — K5900 Constipation, unspecified: Secondary | ICD-10-CM | POA: Diagnosis not present

## 2020-04-20 DIAGNOSIS — F064 Anxiety disorder due to known physiological condition: Secondary | ICD-10-CM | POA: Diagnosis not present

## 2020-04-20 DIAGNOSIS — R0602 Shortness of breath: Secondary | ICD-10-CM | POA: Diagnosis not present

## 2020-04-20 DIAGNOSIS — F5105 Insomnia due to other mental disorder: Secondary | ICD-10-CM | POA: Diagnosis not present

## 2020-04-20 DIAGNOSIS — M25521 Pain in right elbow: Secondary | ICD-10-CM | POA: Diagnosis not present

## 2020-04-20 DIAGNOSIS — I10 Essential (primary) hypertension: Secondary | ICD-10-CM | POA: Diagnosis not present

## 2020-04-20 DIAGNOSIS — F4323 Adjustment disorder with mixed anxiety and depressed mood: Secondary | ICD-10-CM | POA: Diagnosis not present

## 2020-04-22 DIAGNOSIS — J449 Chronic obstructive pulmonary disease, unspecified: Secondary | ICD-10-CM | POA: Diagnosis not present

## 2020-04-22 DIAGNOSIS — F064 Anxiety disorder due to known physiological condition: Secondary | ICD-10-CM | POA: Diagnosis not present

## 2020-04-22 DIAGNOSIS — K5900 Constipation, unspecified: Secondary | ICD-10-CM | POA: Diagnosis not present

## 2020-04-22 DIAGNOSIS — R634 Abnormal weight loss: Secondary | ICD-10-CM | POA: Diagnosis not present

## 2020-04-22 DIAGNOSIS — R0602 Shortness of breath: Secondary | ICD-10-CM | POA: Diagnosis not present

## 2020-04-22 DIAGNOSIS — I2510 Atherosclerotic heart disease of native coronary artery without angina pectoris: Secondary | ICD-10-CM | POA: Diagnosis not present

## 2020-04-22 DIAGNOSIS — F5105 Insomnia due to other mental disorder: Secondary | ICD-10-CM | POA: Diagnosis not present

## 2020-04-22 DIAGNOSIS — R195 Other fecal abnormalities: Secondary | ICD-10-CM | POA: Diagnosis not present

## 2020-04-22 DIAGNOSIS — I5022 Chronic systolic (congestive) heart failure: Secondary | ICD-10-CM | POA: Diagnosis not present

## 2020-04-22 DIAGNOSIS — F332 Major depressive disorder, recurrent severe without psychotic features: Secondary | ICD-10-CM | POA: Diagnosis not present

## 2020-04-25 ENCOUNTER — Other Ambulatory Visit: Payer: Self-pay | Admitting: Cardiology

## 2020-04-27 ENCOUNTER — Other Ambulatory Visit: Payer: Self-pay

## 2020-04-27 ENCOUNTER — Other Ambulatory Visit (HOSPITAL_COMMUNITY)
Admission: RE | Admit: 2020-04-27 | Discharge: 2020-04-27 | Disposition: A | Discharge status: Home / Self Care | Payer: Medicare Other | Source: Ambulatory Visit | Attending: Internal Medicine | Admitting: Internal Medicine

## 2020-04-27 ENCOUNTER — Ambulatory Visit (HOSPITAL_COMMUNITY)
Admission: RE | Admit: 2020-04-27 | Discharge: 2020-04-27 | Disposition: A | Discharge status: Home / Self Care | Payer: Medicare Other | Source: Ambulatory Visit | Attending: Internal Medicine | Admitting: Internal Medicine

## 2020-04-27 ENCOUNTER — Ambulatory Visit (INDEPENDENT_AMBULATORY_CARE_PROVIDER_SITE_OTHER): Payer: Medicare Other | Admitting: Internal Medicine

## 2020-04-27 ENCOUNTER — Encounter: Payer: Self-pay | Admitting: Internal Medicine

## 2020-04-27 DIAGNOSIS — R053 Chronic cough: Secondary | ICD-10-CM | POA: Diagnosis not present

## 2020-04-27 DIAGNOSIS — J479 Bronchiectasis, uncomplicated: Secondary | ICD-10-CM | POA: Diagnosis not present

## 2020-04-27 DIAGNOSIS — R0602 Shortness of breath: Secondary | ICD-10-CM | POA: Diagnosis not present

## 2020-04-27 DIAGNOSIS — F4323 Adjustment disorder with mixed anxiety and depressed mood: Secondary | ICD-10-CM | POA: Diagnosis not present

## 2020-04-27 DIAGNOSIS — R918 Other nonspecific abnormal finding of lung field: Secondary | ICD-10-CM | POA: Diagnosis not present

## 2020-04-27 DIAGNOSIS — J449 Chronic obstructive pulmonary disease, unspecified: Secondary | ICD-10-CM | POA: Diagnosis not present

## 2020-04-27 LAB — SEDIMENTATION RATE: Sed Rate: 9 mm/hr (ref 0–16)

## 2020-04-27 NOTE — Progress Notes (Addendum)
[name] [name], male    DOB: 11-Nov-1945    MRN: [id number]   Brief patient profile:  43 yowm electrical engineer quit all smoking 2016  with R Empyema maint on spriva dpi /ACEi with variable cough but no doe since 2016 previously followed by Dr [name] referred to pulmonary clinic 05/21/2019 by Dr   [name]    History of Present Illness  05/21/2019  Pulmonary/ 1st office eval/[name]  Chief Complaint  Patient presents with  . Pulmonary Consult    Referred by Dr. [name] for eval of COPD. Former Dr [name] pt. He is maintained on spiriva and does not use a rescue inhaler. He states his breathing is doing well currently.    Dyspnea:  3 x weekly treadmill x 50 min at 3.6 -4 and 4.5  Cough: comes and goes x years variably productive not noct  Sleep: does fine in recliner / can do flat in bed  SABA use: none  rec Try off spiriva to see if you notice any difference in your exercise tolerance or cough For cough > mucinex dm up to 1200 mg every 12 hours as needed  Please schedule a follow up visit in 2 months at [place] office with PFT's first if possible   08/06/19  NP dx GOLD II barely   rec Activity as tolerated.  Would discuss with Cardiology that Lisinopril may be aggravating your cough .  Trial of Stiolto 2 puffs daily >  Helped some, too expensive > bevespi  May use Albuterol Inhaler 1-2 puffs every 6hrs as needed.    12/13/2019  f/u ov/[name] re:  GOLD II barely / maint on bevespi and off lisinopril  08/13/19 Chief Complaint  Patient presents with  . Follow-up    productive cough with thick, yellow phlegm  Dyspnea:  Treadmill x up to 55 min 4pm x 4%  Cough: ever since empyema surgery but changed early September 2021 better p zpak Sleeping: in recliner due to back / minimal cough  SABA use: none  02: none  rec Breztri Take 2 puffs first thing in am and then another 2 puffs about 12 hours later x 2 weeks and stop and then restart bevespi only if notice deterioration Pantoprazole  (protonix) 40 mg   Take  30-60 min before first meal of the day and Pepcid (famotidine)  20 mg one after  Supper until return to office  GERD diet  Please remember to go to the lab and x-ray department at [facility]   for your tests - we will call you with the results when they are available. Please schedule a follow up office visit in 6 weeks, call sooner if needed    01/21/2020  f/u ov/[name] [name] re: COPD GOLD II /  cough x 2016 when dx with R empyema  Chief Complaint  Patient presents with  . Follow-up    productive cough with pinkish color phlegm  Dyspnea:  No change ex tol treadmill on or off breztri vs bevespi vs nothin  Cough: actually better while on treadmill/ still pink tinged variably on baby asa daily  Sleeping: worse on L side down  SABA use: none  02: none  rec For changed mucus from your normal go ahead and use zpak  If mucus gets really bloody, stop the baby aspirin until better  [facility] to stop the reflux meds  - you may need to take pepcid 20 mg twice daily x one week then one daily x week and  stop   schedule your CT chest and sinus and the results > bronchiectasis/ no sinusitis      04/27/2020  f/u ov/[place] office/[name] re: GOLD II/ bronchiectasis  Chief Complaint  Patient presents with  . Follow-up    Shortness of breath with activity, productive cough with yellow phlegm  Dyspnea:  Hardly ever doing the treadmill / uphill to mailbox and back 7-8 h sob/tired Cough: less productive,  More irritative Sleeping: L side down makes same as usual / 10 degrees bed blocks  SABA use: has not tried albuterol  02: none  Covid status: triple vax  Lung cancer screening: just had hrct    No obvious day to day or daytime variability or assoc excess/ purulent sputum or mucus plugs or hemoptysis or cp or chest tightness, subjective wheeze or overt sinus or hb symptoms.   Sleeping as above without nocturnal  or early am exacerbation  of respiratory  c/o's or need for noct  saba. Also denies any obvious fluctuation of symptoms with weather or environmental changes or other aggravating or alleviating factors except as outlined above   No unusual exposure hx or h/o childhood pna/ asthma or knowledge of premature birth.  Current Allergies, Complete Past Medical History, Past Surgical History, Family History, and Social History were reviewed in [employer] record.  ROS  The following are not active complaints unless bolded Hoarseness, sore throat, dysphagia, dental problems, itching, sneezing,  nasal congestion or discharge of excess mucus or purulent secretions, ear ache,   fever, chills, sweats, unintended wt loss or wt gain, classically pleuritic or exertional cp,  orthopnea pnd or arm/hand swelling  or leg swelling, presyncope, palpitations, abdominal pain, anorexia, nausea, vomiting, diarrhea  or change in bowel habits or change in bladder habits, change in stools or change in urine, dysuria, hematuria,  rash, arthralgias, visual complaints, headache, numbness, weakness or ataxia or problems with walking or coordination,  change in mood or  memory.        Current Meds  Medication Sig  . acetaminophen (TYLENOL) 325 MG tablet Take 650 mg by mouth every 6 (six) hours as needed.  . ALPRAZolam (XANAX) 1 MG tablet Take 1 mg by mouth at bedtime.  Marland Kitchen aspirin EC 81 MG tablet Take 1 tablet (81 mg total) by mouth daily.  . Calcium Citrate-Vitamin D (CALCIUM CITRATE + D3 PO) Take 1 tablet by mouth daily.  . carvedilol (COREG) 6.25 MG tablet Take 1 tablet (6.25 mg total) by mouth 2 (two) times daily with a meal.  . linaclotide (LINZESS) 72 MCG capsule Take 72 mcg by mouth daily before breakfast.  . losartan (COZAAR) 25 MG tablet TAKE 1/2 TABLET(12.5 MG) BY MOUTH DAILY  . melatonin 1 MG TABS tablet Take 3 mg by mouth at bedtime. Takes 4.5 mg per day  . Multiple Vitamins-Minerals (SENIOR VITES PO) Take 1 tablet by mouth daily.  . nitroGLYCERIN (NITROSTAT)  0.4 MG SL tablet Place 1 tablet (0.4 mg total) under the tongue every 5 (five) minutes as needed.  . rosuvastatin (CRESTOR) 40 MG tablet Take 1 tablet (40 mg total) by mouth daily.  . sertraline (ZOLOFT) 50 MG tablet Take 50 mg by mouth at bedtime.  . tamsulosin (FLOMAX) 0.4 MG CAPS capsule Take 1 capsule (0.4 mg total) by mouth daily.                      Past Medical History:  Diagnosis Date  . Acute systolic heart  failure ([place])   . COPD (chronic obstructive pulmonary disease) ([place])   . Enlarged prostate with lower urinary tract symptoms (LUTS)   . Hyperlipidemia   . Hypertension   . Low serum testosterone level   . PAF (paroxysmal atrial fibrillation) ([place])   . Pneumonia 05/2014  . Pyothorax without fistula ([place])   . Sciatica   . Shortness of breath dyspnea   . Solitary pulmonary nodule   . STEMI (ST elevation myocardial infarction) ([place])    09/27/17 PCI/DES x1 mLAD, PTCA of the diag branch. EF 30% Lifevest at discharge.   . Testicular hypofunction   . Urinary frequency       Objective:    04/27/2020          176  01/21/2020        192   12/13/19 192 lb 3.2 oz (87.2 kg)  08/06/19 198 lb (89.8 kg)  06/24/19 198 lb (89.8 kg)      Vital signs reviewed  04/27/2020  - Note at rest 02 sats  98% on RA   General appearance:    Thin amb wm nad    HEENT : pt wearing mask not removed for exam due to covid - 19 concerns.   NECK :  without JVD/Nodes/TM/ nl carotid upstrokes bilaterally   LUNGS: no acc muscle use,  Min barrel  contour chest wall with decreased bs on R base assoc  Min dullness and  without cough on insp or exp maneuvers      CV:  RRR  no s3 or murmur or increase in P2, and no edema   ABD:  soft and nontender with pos end  insp Hoover's  in the supine position. No bruits or organomegaly appreciated, bowel sounds nl  MS:   Nl gait/  ext warm without deformities, calf tenderness, cyanosis or clubbing No obvious joint restrictions   SKIN: warm and dry without  lesions    NEURO:  alert, approp, nl sensorium with  no motor or cerebellar deficits apparent.           CXR PA and Lateral:   04/27/2020 :    I personally reviewed images and  impression as follows:    no acute changes pleuroparenchymal scarring R base     Labs ordered 04/27/2020  :    alpha one AT phenotype  / ESR/ Quant Ig's    labs per Dr Nevada Crane 04/21/19 wnl though did not include tft's which pt says were ordered and are pending        Assessment

## 2020-04-27 NOTE — Assessment & Plan Note (Addendum)
Quit all smoking 2016 with dx of R empyema with chronic cough since then - Try off spiriva 05/21/2019  - PFT's  08/06/19  FEV1 2.60 (75 % ) ratio 0.64  p 14 % improvement from saba p 0 prior to study with DLCO  18.09 (66%) corrects to 3.14 (79%)  for alv volume and FV curve minimally concave  -  04/27/2020   Walked RA  approx   600 ft  @ fast pace  stopped due to  [employer] with sats 99%     Not convinced copd has anything to do with his symptoms at this point  - appears anxious/ deconditioned and losing wt ? Depressed vs GI dz > w/u in progress.

## 2020-04-27 NOTE — Assessment & Plan Note (Signed)
See HRCT  02/18/20 Diffuse bronchial wall thickening with mild to moderate centrilobular and paraseptal emphysema. High-resolution images also demonstrate widespread areas of cylindrical bronchiectasis with considerable mucous plugging with assoc nodules typical of MAI  - sputum 03/30/20   Nl flora  - sputum afb 03/30/20  >>> - Quant Igs 04/27/2020  >>> - Alpha one phenotype  04/27/2020 >>>  Discussed ddx of bronchiectasis with above w/u pending.  In meantime the cough he has is dry and not typical at all of bronchiectasis and has flared off gerd rx but since he has GI w/u for 04/29/20 already scheduled will leave off gerd rx for now.

## 2020-04-27 NOTE — Patient Instructions (Addendum)
Ok to try  albuterol (or breaztri) 15 min before an activity that you know would make you short of breath and see if it makes any difference and if makes none then don't take it after activity unless you can't catch your breath.  Bronchiectasis =   you have scarring of your bronchial tubes which means that they don't function perfectly normally and mucus tends to pool in certain areas of your lung which can cause pneumonia and further scarring of your lung and bronchial tubes  Whenever you develop cough congestion take mucinex or mucinex dm > these will help keep the mucus loose and flowing but if your condition worsens you need to seek help immediately preferably here or somewhere inside the [facility] system to compare xrays ( worse = darker or bloody mucus or pain on breathing in)   Make sure you check your oxygen saturation at your highest level of activity to be sure it stays over 90% and keep track of it at least once a week, more often if breathing getting worse, and let me know if losing ground.   Please remember to go to the lab and x-ray department at [facility]   for your tests - we will call you with the results when they are available.       Please schedule a follow up visit in 3 months but call sooner if needed

## 2020-04-27 NOTE — Assessment & Plan Note (Signed)
Onset 2016 assoc with empyema on R  - swallowing eval 11/2018  Oropharyngeal dysphagia s asp/ no restrictions  - off lisinopril  08/13/19 - Allergy profile 12/13/19  >  Eos 0.3 /  IgE  58 - 12/13/2019 empirically try gerd rx x 6 weeks > no better then stopped  -   Sinus CT  02/18/20  Congested appearance of nasal mucosa  but  Negative for sinusitis  Most likely this is now Upper airway cough syndrome (previously labeled PNDS),  is so named because it's frequently impossible to sort out how much is  CR/sinusitis with freq throat clearing (which can be related to primary GERD)   vs  causing  secondary (" extra esophageal")  GERD from wide swings in gastric pressure that occur with throat clearing, often  promoting self use of mint and menthol lozenges that reduce the lower esophageal sphincter tone and exacerbate the problem further in a cyclical fashion.   These are the same pts (now being labeled as having "irritable larynx syndrome" by some cough centers) who not infrequently have a history of having failed to tolerate ace inhibitors,  dry powder inhalers or biphosphonates or report having atypical/extraesophageal reflux symptoms that don't respond to standard doses of PPI  and are easily confused as having aecopd or asthma flares by even experienced allergists/ pulmonologists (myself included).   Next step ? Add gerd rx back to regimen since cough has started since off it but seeing GI  04/29/20 and will defer rx to GI for now   >>> f/u  q 3 m, sooner prn           Each maintenance medication was reviewed in detail including emphasizing most importantly the difference between maintenance and prns and under what circumstances the prns are to be triggered using an action plan format where appropriate.   Each maintenance medication was reviewed in detail including emphasizing most importantly the difference between maintenance and prns and under what circumstances the prns are to be triggered using an  action plan format where appropriate.  Total time for H and P, chart review, counseling,  directly observing portions of ambulatory 02 saturation study/ and generating customized AVS unique to this office visit / same day charting > 30 min

## 2020-04-28 ENCOUNTER — Encounter: Payer: Self-pay | Admitting: *Deleted

## 2020-04-28 LAB — IGG, IGA, IGM
IgA: 415 mg/dL (ref 61–437)
IgG (Immunoglobin G), Serum: 1116 mg/dL (ref 603–1613)
IgM (Immunoglobulin M), Srm: 52 mg/dL (ref 15–143)

## 2020-04-28 LAB — ALPHA-1 ANTITRYPSIN PHENOTYPE: A-1 Antitrypsin, Ser: 141 mg/dL (ref 101–187)

## 2020-04-29 ENCOUNTER — Encounter: Payer: Self-pay | Admitting: *Deleted

## 2020-04-29 ENCOUNTER — Encounter: Payer: Self-pay | Admitting: Gastroenterology

## 2020-04-29 ENCOUNTER — Other Ambulatory Visit: Payer: Self-pay

## 2020-04-29 ENCOUNTER — Ambulatory Visit (INDEPENDENT_AMBULATORY_CARE_PROVIDER_SITE_OTHER): Payer: Medicare Other | Admitting: Gastroenterology

## 2020-04-29 VITALS — BP 123/71 | HR 77 | Temp 97.1°F | Ht 72.0 in | Wt 177.6 lb

## 2020-04-29 DIAGNOSIS — Z9889 Other specified postprocedural states: Secondary | ICD-10-CM | POA: Diagnosis not present

## 2020-04-29 DIAGNOSIS — R1031 Right lower quadrant pain: Secondary | ICD-10-CM

## 2020-04-29 DIAGNOSIS — K5900 Constipation, unspecified: Secondary | ICD-10-CM

## 2020-04-29 DIAGNOSIS — Z8719 Personal history of other diseases of the digestive system: Secondary | ICD-10-CM | POA: Diagnosis not present

## 2020-04-29 DIAGNOSIS — R140 Abdominal distension (gaseous): Secondary | ICD-10-CM | POA: Diagnosis not present

## 2020-04-29 DIAGNOSIS — R634 Abnormal weight loss: Secondary | ICD-10-CM | POA: Diagnosis not present

## 2020-04-29 DIAGNOSIS — G8929 Other chronic pain: Secondary | ICD-10-CM | POA: Diagnosis not present

## 2020-04-29 MED ORDER — LINACLOTIDE 145 MCG PO CAPS: 145.0000 ug | ORAL_CAPSULE | Freq: Every day | ORAL | 0 refills | Status: DC

## 2020-04-29 NOTE — Patient Instructions (Signed)
1. Increase Linzess to 145 mgc once daily before breakfast. Samples provided. If no significant improvement in constipation, please let me know and we will adjust your medication. 2. CT scan of you abdomen and pelvis. We will contact you with results as soon as available.

## 2020-04-29 NOTE — Progress Notes (Signed)
Primary Care Physician:  Benita Stabile, MD  Primary Gastroenterologist:  Hennie Duos. Marletta Lor, DO   Chief Complaint  Patient presents with  . Weight Loss    Lost 20 lbs since 01/2020  . Constipation  . Fatigue    Energy, strength, endurance are reduced  . hungry    Stays hungry all the time    HPI:  [name] [name] is a 75 y.o. male with history of COPD Gold II, bronchiectasis, right empyema 2016 requiring surgical intervention (followed by Dr. Sherene Sires) presenting for further evaluation of weight loss, change in bowels.  Patient also has a history of CAD/ICM/chronic systolic heart failure, A. fib after acute MI currently not anticoagulated.   Patient presents today with multiple concerns.  He brought a detailed list today.  He has been working with his pulmonologist and PCP regarding his concerns.  In November, his wife had ankle surgery.  Around that time he started having issues with anxiety and insomnia.  His PCP started him on Xanax and Zoloft which he continues.  His sleep has improved.  Around the same time he started noticing digestive issues.  Typically has had some issues with intermittent constipation but in November started having problem evacuating his bowels.  Bowel movement daily but has to "push like heck" to have a BM.  He tried Colace and Metamucil with no results.  Senokot 2 daily helped a little.  Recently PCP started him on Linzess 72 mcg daily, about a week ago.  Very little improvement with his stools.  Initially stools Bristol 1 or Bristol 2.  Now Bristol 3 with still has to strain.  Denies blood in the stool or melena.  Today he had his first episode of diarrhea, he is not sure why.  Along with the symptoms he dropped 20 pounds.  He felt this was unusual because he was eating well and actually working out less because his wife had surgery and required quite a bit of assistance.  He had been working out 3 times a week, 120 minutes at a time, combination of treadmill and strength  training.  Just went back to the gym last week, had been nearly 3 months.  Noticed he has significant reduction in his strength and endurance.His weight has been stable the past 1 month although he has not gained any of his weight back.  He states he has been hungry even after eating a large meal.  Even with postprandial fullness and bloating, he continues to have hunger pains.  Denies nausea or vomiting.  At first he thought he had a tapeworm because they took in a stray cat that had worms.  Stool test x2 neg per patient.  Patient has history of chronic intermittent cough since his surgery for empyema years ago.  In November he states his cough was not that bad, progressively worse over the past couple of months.  Some days he does okay.  States Dr. Sherene Sires put him on a PPI to see if his cough was reflux related, eventually stopped it because he had no improvement in his symptoms.  Patient denies typical heartburn.  No dysphagia.  Patient reports he has been having some pain at site of remote right inguinal hernia repair with mesh. Surgery done in [place] back in 2008.  He also wonders if this is what is preventing him from having an easy bowel movement.  Completed colonoscopy in January 2021 for positive Cologuard. Eight 2 to 6 mm polyps in the sigmoid colon,  at the splenic flexure, in the transverse colon, at the hepatic flexure and in the ascending colon, removed with a cold snare. Diverticulosis in the recto-sigmoid colon, in the sigmoid colon, in the descending colon and at the hepatic flexure External and internal hemorrhoids.  6 polyps were tubular adenomas, 2 hyperplastic.  He was advised at that time that we do not routinely screen for polyps after the age of 63 but if he remains healthy he could consider at age 74.  Recently seen by Dr. Melvyn Novas.  He is not convinced his COPD has anything to do with his symptoms of weight loss and fatigue.  Chest CT back in December by Dr. Melvyn Novas, evidence of  bronchiectasis with findings most compatible with a progressive chronic indolent atypical infectious process such as MAI (mycobacterium avium intracellulare).  Patient was doing better in terms of respiratory symptoms at that time so they elected to treat supportively with Mucinex and continue to follow. However 03/2020 AFB culture collected and pending, AFB smear negative.  Chest x-ray 2 days ago showed stable right basilar opacity concerning for atelectasis or pneumonia with associated small right pleural effusion.  CTA chest, abdomen, pelvis July 2019: No evidence of mesenteric ischemia.  Emphysema noted.  Labs from February 7 with sed rate normal at 9, alpha 1 antitrypsin phenotype normal.   Labs from April 20, 2020: White blood cell count 9700, hemoglobin 14, platelets 264,000, glucose 103, BUN 19, creatinine 1.11, albumin 4.1, total bilirubin 0.5, alk phos 51, AST 15, ALT 23, patient reports recent thyroid labs were normal.  He also states that his PCP checked him twice for tapeworms with stool studies which were negative.       Current Outpatient Medications  Medication Sig Dispense Refill  . acetaminophen (TYLENOL) 325 MG tablet Take 650 mg by mouth every 6 (six) hours as needed.    . ALPRAZolam (XANAX) 1 MG tablet Take 1 mg by mouth at bedtime.    Marland Kitchen aspirin EC 81 MG tablet Take 1 tablet (81 mg total) by mouth daily. 90 tablet 3  . Calcium Citrate-Vitamin D (CALCIUM CITRATE + D3 PO) Take 1 tablet by mouth daily.    . carvedilol (COREG) 6.25 MG tablet Take 1 tablet (6.25 mg total) by mouth 2 (two) times daily with a meal. 180 tablet 3  . linaclotide (LINZESS) 72 MCG capsule Take 72 mcg by mouth daily before breakfast.    . losartan (COZAAR) 25 MG tablet TAKE 1/2 TABLET(12.5 MG) BY MOUTH DAILY 15 tablet 0  . melatonin 1 MG TABS tablet Take 3 mg by mouth at bedtime. Takes 4.5 mg per day    . Multiple Vitamins-Minerals (SENIOR VITES PO) Take 1 tablet by mouth daily.    . nitroGLYCERIN  (NITROSTAT) 0.4 MG SL tablet Place 1 tablet (0.4 mg total) under the tongue every 5 (five) minutes as needed. 25 tablet 2  . NON FORMULARY Fluc2% toe nail drops--apply topically once daily for toenail fungus    . rosuvastatin (CRESTOR) 40 MG tablet Take 1 tablet (40 mg total) by mouth daily. 90 tablet 3  . sertraline (ZOLOFT) 50 MG tablet Take 50 mg by mouth at bedtime.    . tamsulosin (FLOMAX) 0.4 MG CAPS capsule Take 1 capsule (0.4 mg total) by mouth daily. 30 capsule 2  . albuterol (PROAIR HFA) 108 (90 Base) MCG/ACT inhaler Inhale 1-2 puffs into the lungs every 6 (six) hours as needed for wheezing or shortness of breath. (Patient not taking: No sig reported)  1 g 2   No current facility-administered medications for this visit.    Allergies as of 04/29/2020  . (No Known Allergies)    ROS:  General: Negative for anorexia, +weight loss, fever, chills, +fatigue, +weakness. Eyes: Negative for vision changes.  ENT: Negative for hoarseness, difficulty swallowing , nasal congestion. CV: Negative for chest pain, angina, palpitations, +dyspnea on exertion, peripheral edema.  Respiratory: Negative for dyspnea at rest, +dyspnea on exertion, +cough, +sputum, wheezing.  GI: See history of present illness. GU:  Negative for dysuria, hematuria, urinary incontinence, urinary frequency, nocturnal urination.  MS: Negative for joint pain, low back pain.  Derm: Negative for rash or itching.  Neuro: Negative for weakness, abnormal sensation, seizure, frequent headaches, memory loss, confusion.  Psych: Negative for anxiety, depression, suicidal ideation, hallucinations.  Endo: see hpi Heme: Negative for bruising or bleeding. Allergy: Negative for rash or hives.    Physical Examination:  BP 123/71   Pulse 77   Temp (!) 97.1 F (36.2 C) (Temporal)   Ht 6' (1.829 m)   Wt 177 lb 9.6 oz (80.6 kg)   BMI 24.09 kg/m    General: Thin elderly male in NAD. Able to talk in complete sentences but does appear  to have some SOB with this. No acute distress.  Head: Normocephalic, atraumatic.   Eyes: Conjunctiva pink, no icterus. Mouth: masked Neck: Supple without thyromegaly, masses, or lymphadenopathy.  Lungs: Clear to auscultation bilaterally.  Heart: Regular rate and rhythm, no murmurs rubs or gallops.  Abdomen: Bowel sounds are normal,   nondistended, no hepatosplenomegaly or masses, no abdominal bruits or    hernia , no rebound or guarding.  Mild to moderate rlq/right suprapubic tenderness without obvious hernia. Mild upper abdominal tenderness with deep palpation Rectal: not performed Extremities: No lower extremity edema. No clubbing or deformities.  Neuro: Alert and oriented x 4 , grossly normal neurologically.  Skin: Warm and dry, no rash or jaundice.   Psych: Alert and cooperative, normal mood and affect.  Labs: Lab Results  Component Value Date   CREATININE 1.22 (H) 03/21/2019   BUN 20 03/21/2019   NA 139 03/21/2019   K 4.9 03/21/2019   CL 103 03/21/2019   CO2 31 03/21/2019   Lab Results  Component Value Date   ALT 22 10/30/2018   AST 21 10/30/2018   ALKPHOS 42 10/30/2018   BILITOT 0.7 07/30/2018   Lab Results  Component Value Date   WBC 13.0 (H) 12/13/2019   HGB 13.7 12/13/2019   HCT 42.5 12/13/2019   MCV 97.9 12/13/2019   PLT 249 12/13/2019   Lab Results  Component Value Date   TSH 2.14 10/30/2018   No results found for: IRON, TIBC, FERRITIN   Imaging Studies: DG Chest 2 View  Result Date: 04/27/2020 CLINICAL DATA:  Shortness of breath. EXAM: CHEST - 2 VIEW COMPARISON:  December 13, 2019. FINDINGS: The heart size and mediastinal contours are within normal limits. No pneumothorax is noted. Left lung is clear. Stable right basilar opacity is noted concerning for atelectasis or pneumonia with associated small right pleural effusion. The visualized skeletal structures are unremarkable. IMPRESSION: Stable right basilar opacity concerning for atelectasis or pneumonia  with associated small right pleural effusion. Electronically Signed   By: Marijo Conception M.D.   On: 04/27/2020 15:39    Assessment/Plan:  Pleasant 75 year old male with history of COPD Gold II, bronchiectasis, right empyema thousand and 16 requiring surgical intervention (followed by Dr. Melvyn Novas) presenting for further evaluation of  weight loss, change in bowels.  Weight loss: 20 pound weight loss in the past 6 months most in the past 3 to 4 months.  Possibly multifactorial in the setting of significant pulmonary disease, possible depression however given change in bowel habits and abdominal pain, needs further investigation.  Plan on CT abdomen pelvis with contrast.  Encouraged him to continue adequate oral intake.  He can add nutritional supplemental drink daily.  Further recommendations to follow.  Change in bowel habits: Continues to have daily bowel movement however associated with significant difficulty evacuating stool.  Patient having to strain.  Stools are hard.  Minimal improvement with Colace, Metamucil, Senokot, Linzess 72 mcg daily.  Colonoscopy is up-to-date, completed 1 year ago.  Patient complains of associated right lower quadrant pain at site of prior inguinal hernia repair with mesh placement.  Is not clear that this has anything to do with his change in bowel habits given pain and weight loss, CT abdomen pelvis with contrast planned.  Modifications to bowel regimen as well.  1. Increase Linzess to 145 mcg daily.  Samples provided.  If effective he will call for prescription. 2. CT abdomen pelvis with contrast 3. Further recommendations to follow.

## 2020-04-30 ENCOUNTER — Encounter: Payer: Self-pay | Admitting: Gastroenterology

## 2020-05-04 DIAGNOSIS — F4323 Adjustment disorder with mixed anxiety and depressed mood: Secondary | ICD-10-CM | POA: Diagnosis not present

## 2020-05-04 NOTE — Telephone Encounter (Signed)
Spoke with the pt after receiving the following email:   This past weekend I have had further increase in the urge to cough and more tightness in the chest when I do cough.   It is still sometimes productive, but sometimes not.    The hard candy and the generic equivalent of Musinex DM help some.   Another thing that helps is Tylenol.   I do not have a fever according to the thermometer, but I feel feverish when these symptoms are at their peak, so I tried a moderate dose (500 mg) of Tylenol and it helps some.  The nature of the sputum has not changed.         Breathing in often feels congested, but usually not painful.        Please tell me if I should be doing something else.   As always, I appreciate the help of you and your staff.                                            [name] [name]  Called pt- he states when the cough is prod he is still producing some light yellow sputum- same as when he was here for ov 04/27/20  He has had minimal wheezing and increased SOB as well  He states no sore throat but mild HA off and on  He says when cough is non prod, this makes his chest feel tighter- he has used his albuterol inhaler once since the symptoms have worsened  He is taking mucinex dm 1200 bid  Has had covid vax x 3  Please advise thanks  Instructions from 04/27/20:  Ok to try  albuterol (or breaztri) 15 min before an activity that you know would make you short of breath and see if it makes any difference and if makes none then don't take it after activity unless you can't catch your breath.  Bronchiectasis =   you have scarring of your bronchial tubes which means that they don't function perfectly normally and mucus tends to pool in certain areas of your lung which can cause pneumonia and further scarring of your lung and bronchial tubes  Whenever you develop cough congestion take mucinex or mucinex dm > these will help keep the mucus loose and flowing but if your condition worsens you need to  seek help immediately preferably here or somewhere inside the [facility] system to compare xrays ( worse = darker or bloody mucus or pain on breathing in)   Make sure you check your oxygen saturation at your highest level of activity to be sure it stays over 90% and keep track of it at least once a week, more often if breathing getting worse, and let me know if losing ground.   Please remember to go to the lab and x-ray department at [facility]   for your tests - we will call you with the results when they are available.       Please schedule a follow up visit in 3 months but call sooner if needed

## 2020-05-04 NOTE — Telephone Encounter (Signed)
Tried calling the pt back and he hid not answer- LMTCB and also sent email letting him know to call for appt.

## 2020-05-04 NOTE — Telephone Encounter (Signed)
Needs ov with all meds in hand to regroup, nothing else to try over the counter

## 2020-05-11 ENCOUNTER — Other Ambulatory Visit: Payer: Self-pay | Admitting: Gastroenterology

## 2020-05-11 MED ORDER — LINACLOTIDE 145 MCG PO CAPS: 145.0000 ug | ORAL_CAPSULE | Freq: Every day | ORAL | 1 refills | Status: DC

## 2020-05-15 ENCOUNTER — Encounter: Payer: Self-pay | Admitting: Internal Medicine

## 2020-05-15 ENCOUNTER — Other Ambulatory Visit: Payer: Self-pay

## 2020-05-15 ENCOUNTER — Ambulatory Visit (INDEPENDENT_AMBULATORY_CARE_PROVIDER_SITE_OTHER): Payer: Medicare Other | Admitting: Internal Medicine

## 2020-05-15 VITALS — BP 130/70 | HR 70 | Temp 97.3°F | Ht 72.0 in | Wt 174.6 lb

## 2020-05-15 DIAGNOSIS — J479 Bronchiectasis, uncomplicated: Secondary | ICD-10-CM

## 2020-05-15 DIAGNOSIS — J449 Chronic obstructive pulmonary disease, unspecified: Secondary | ICD-10-CM | POA: Diagnosis not present

## 2020-05-15 DIAGNOSIS — R053 Chronic cough: Secondary | ICD-10-CM | POA: Diagnosis not present

## 2020-05-15 NOTE — Patient Instructions (Addendum)
For cough/congestion > mucinex dm  Up to 1200 mg every 12 hours as needed and add flutter   For nasty mucus > zpak    Try prilosec otc 20mg   Take 30-60 min before first meal of the day and Pepcid ac (famotidine) 20 mg one after  Until return   Work on inhaler technique:  relax and gently blow all the way out then take a nice smooth deep breath back in, triggering the inhaler at same time you start breathing in.  Hold for up to 5 seconds if you can. Blow out thru nose. Rinse and gargle with water when done  Only use your albuterol as a rescue medication to be used if you can't catch your breath by resting or doing a relaxed purse lip breathing pattern.  - The less you use it, the better it will work when you need it. - Ok to use up to 2 puffs  every 4 hours if you must but call for immediate appointment if use goes up over your usual need - Don't leave home without it !!  (think of it like the spare tire for your car)   Ok to continue to Try albuterol 15 min before an activity that you know would make you short of breath and see if it makes any difference and if makes none then don't take it after activity unless you can't catch your breath.       Please schedule a follow up office visit in 6 weeks, call sooner if needed

## 2020-05-15 NOTE — Progress Notes (Signed)
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 Pulmonary Consult    Referred by Dr. Holly [name] for eval of COPD. Former Dr [name] [name] pt. He is maintained on spiriva and does not use a rescue inhaler. He states his breathing is doing well currently.    Dyspnea:  3 x weekly treadmill x 50 min at 3.6 -4 and 4.5  Cough: comes and goes x years variably productive not noct  Sleep: does fine in recliner / can do flat in bed  SABA use: none  rec Try off spiriva to see if you notice any difference in your exercise tolerance or cough For cough > mucinex dm up to 1200 mg every 12 hours as needed  Please schedule a follow up visit in 2 months at [name] with PFT's first if possible   08/06/19  NP dx GOLD II barely   rec Activity as tolerated.  Would discuss with Cardiology that Lisinopril may be aggravating your cough .  Trial of Stiolto 2 puffs daily >  Helped some, too expensive > bevespi  May use Albuterol Inhaler 1-2 puffs every 6hrs as needed.    12/13/2019  f/u ov/[name] re:  GOLD II barely / maint on bevespi and off lisinopril  08/13/19 Chief Complaint  Patient presents with  . Follow-up    productive cough with thick, yellow phlegm  Dyspnea:  Treadmill x up to 55 min 4pm x 4%  Cough: ever since empyema surgery but changed early September 2021 better p zpak Sleeping: in recliner due to back / minimal cough  SABA use: none  02: none  rec Breztri Take 2 puffs first thing in am and then another 2 puffs about 12 hours later x 2 weeks and stop and then restart bevespi only if notice deterioration Pantoprazole  (protonix) 40 mg   Take  30-60 min before first meal of the day and Pepcid (famotidine)  20 mg one after  Supper until return to office  GERD diet  Please remember to go to the lab and x-ray department at [facility]   for your tests - we will call you with the results when they are available. Please schedule a follow up office visit in 6 weeks, call sooner if needed    01/21/2020  f/u ov/[name] re: COPD GOLD II /  cough x 2016 when dx with R empyema  Chief Complaint  Patient presents with  . Follow-up    productive cough with pinkish color phlegm  Dyspnea:  No change ex tol treadmill on or off breztri vs bevespi vs nothin  Cough: actually better while on treadmill/ still pink tinged variably on baby asa daily  Sleeping: worse on L side down  SABA use: none  02: none  rec For changed mucus from your normal go ahead and use zpak  If mucus gets really bloody, stop the baby aspirin until better  [facility] to stop the reflux meds  - you may need to take pepcid 20 mg twice daily x one week then one daily x week  and stop   schedule your CT chest and sinus and the results > bronchiectasis/ no sinusitis      04/27/2020  f/u ov/[name] re: GOLD II/ bronchiectasis  Chief Complaint  Patient presents with  . Follow-up    Shortness of breath with activity, productive cough with yellow phlegm  Dyspnea:  Hardly ever doing the treadmill / uphill to mailbox and back 7-8 min sob/tired Cough: less productive,  More irritative Sleeping: L side down makes same as usual / 10 degrees bed blocks  SABA use: has not tried albuterol  02: none  Covid status: triple vax  Lung cancer screening: just had hrct  rec Ok to try  albuterol (or bztri) 15 min before an activity  Bronchiectasis =   you have scarring of your bronchial tubes Make sure you check your oxygen saturation at your highest level of activity to be sure it stays over 90%     05/15/2020  f/u ov/[name] re: copd II/  bronchiectasis Chief Complaint  Patient presents with  . Follow-up    "its been more difficult to cough" Productive cough with yellow phlegm   rx mucinex dm in minimal doses  Dyspnea:  mb and back and 10 min about the same before or after saba Cough: worse in evening not sure before or after supper   Sleeping: better on bed blocks / worse on L side, sleeps p meds but then once stands up  In am   starts coughing x  1 hour SABA use: rarely use now, not sure it helps  02: none  Covid status: vax x 3      No obvious day to day or daytime variability or assoc mucus plugs or hemoptysis or cp or chest tightness, subjective wheeze or overt sinus or hb symptoms.   Sleeping  without nocturnal  exacerbation  of respiratory  c/o's or need for noct saba. Also denies any obvious fluctuation of symptoms with weather or environmental changes or other aggravating or alleviating factors except as outlined above   No unusual exposure hx or h/o childhood pna/ asthma or knowledge of premature birth.  Current Allergies, Complete Past Medical History, Past Surgical History, Family History, and Social History were reviewed in [employer] record.  ROS  The following are not active complaints unless bolded Hoarseness, sore throat, dysphagia, dental problems, itching, sneezing,  nasal congestion or discharge of excess mucus or purulent secretions, ear ache,   fever, chills, sweats, unintended wt loss or wt gain, classically pleuritic or exertional cp,  orthopnea pnd or arm/hand swelling  or leg swelling, presyncope, palpitations, abdominal pain, anorexia, nausea, vomiting, diarrhea  or change in bowel habits or change in bladder habits, change in stools or change in urine, dysuria, hematuria,  rash, arthralgias, visual complaints, headache, numbness, weakness or ataxia or problems with walking or coordination,  change in mood or  memory.        Current Meds  Medication Sig  . acetaminophen  (TYLENOL) 325 MG tablet Take 650 mg by mouth every 6 (six) hours as needed.  Marland Kitchen albuterol (VENTOLIN HFA) 108 (90 Base) MCG/ACT inhaler Inhale 1-2 puffs into the lungs every 6 (six) hours as needed for wheezing or shortness of breath.  . ALPRAZolam (XANAX) 1 MG tablet Take 1 mg by mouth at bedtime.  Marland Kitchen aspirin EC 81 MG tablet Take 1 tablet (81 mg total) by mouth daily.  . Calcium Citrate-Vitamin D (CALCIUM CITRATE + D3 PO) Take 1 tablet  by mouth daily.  . carvedilol (COREG) 6.25 MG tablet Take 1 tablet (6.25 mg total) by mouth 2 (two) times daily with a meal.  . linaclotide (LINZESS) 145 MCG CAPS capsule Take 1 capsule (145 mcg total) by mouth daily before breakfast.  . losartan (COZAAR) 25 MG tablet TAKE 1/2 TABLET(12.5 MG) BY MOUTH DAILY  . melatonin 1 MG TABS tablet Take 3 mg by mouth at bedtime. Takes 4.5 mg per day  . Multiple Vitamins-Minerals (SENIOR VITES PO) Take 1 tablet by mouth daily.  . nitroGLYCERIN (NITROSTAT) 0.4 MG SL tablet Place 1 tablet (0.4 mg total) under the tongue every 5 (five) minutes as needed.  . NON FORMULARY Fluc2% toe nail drops--apply topically once daily for toenail fungus  . rosuvastatin (CRESTOR) 40 MG tablet Take 1 tablet (40 mg total) by mouth daily.  . sertraline (ZOLOFT) 50 MG tablet Take 50 mg by mouth at bedtime.  . tamsulosin (FLOMAX) 0.4 MG CAPS capsule Take 1 capsule (0.4 mg total) by mouth daily.                       Past Medical History:  Diagnosis Date  . Acute systolic heart failure ([place])   . COPD (chronic obstructive pulmonary disease) ([place])   . Enlarged prostate with lower urinary tract symptoms (LUTS)   . Hyperlipidemia   . Hypertension   . Low serum testosterone level   . PAF (paroxysmal atrial fibrillation) ([place])   . Pneumonia 05/2014  . Pyothorax without fistula ([place])   . Sciatica   . Shortness of breath dyspnea   . Solitary pulmonary nodule   . STEMI (ST elevation myocardial infarction) ([place])    09/27/17 PCI/DES x1 mLAD, PTCA  of the diag branch. EF 30% Lifevest at discharge.   . Testicular hypofunction   . Urinary frequency       Objective:     05/15/2020        174 04/27/2020          176  01/21/2020        192   12/13/19 192 lb 3.2 oz (87.2 kg)  08/06/19 198 lb (89.8 kg)  06/24/19 198 lb (89.8 kg)      Vital signs reviewed  05/15/2020  - Note at rest 02 sats  99% on [name]   General appearance:  Anxious thin wm  nad  With prominent pseudowheeze better with plm    HEENT : pt wearing mask not removed for exam due to covid - 19 concerns.   NECK :  without JVD/Nodes/TM/ nl carotid upstrokes bilaterally   LUNGS: no acc muscle use,  Min barrel  contour chest wall with bilateral  slightly decreased bs s audible wheeze and  without cough on insp or exp maneuvers and min  Hyperresonant  to  percussion bilaterally     CV:  RRR  no s3 or murmur or increase in P2, and no edema   ABD:  soft and nontender with pos end  insp Hoover's  in the supine position. No bruits or organomegaly appreciated, bowel sounds nl  MS:   Nl gait/  ext warm without deformities, calf tenderness, cyanosis or clubbing No obvious joint restrictions   SKIN: warm and dry without lesions    NEURO:  alert, approp, nl sensorium with  no motor or cerebellar deficits apparent.                    Assessment

## 2020-05-16 ENCOUNTER — Encounter: Payer: Self-pay | Admitting: Internal Medicine

## 2020-05-16 NOTE — Assessment & Plan Note (Addendum)
MM/Quit all smoking 2016 with dx of R empyema with chronic cough since then - Try off spiriva 05/21/2019  - PFT's  08/06/19  FEV1 2.60 (75 % ) ratio 0.64  p 14 % improvement from saba p 0 prior to study with DLCO  18.09 (66%) corrects to 3.14 (79%)  for alv volume and FV curve minimally concave  -  04/27/2020   Walked RA  approx   600 ft  @ fast pace  stopped due to  Sob with sats 99%    -  04/27/2020    Alpha one AT phenotype  MM level 141  - 05/15/2020  After extensive coaching inhaler device,  effectiveness =    75% from a baseline of well < 50%  So ok to rechallenge with prn saba  Extensive discussion of approp management of copd/ bronchiectasis to include:  I spent extra time with pt today reviewing appropriate use of albuterol for prn use on exertion with the following points: 1) saba is for relief of sob that does not improve by walking a slower pace or resting but rather if the pt does not improve after trying this first. 2) If the pt is convinced, as many are, that saba helps recover from activity faster then it's easy to tell if this is the case by re-challenging : ie stop, take the inhaler, then p 5 minutes try the exact same activity (intensity of workload) that just caused the symptoms and see if they are substantially diminished or not after saba 3) if there is an activity that reproducibly causes the symptoms, try the saba 15 min before the activity on alternate days   If in fact the saba really does help, then fine to continue to use it prn but advised may need to look closer at the maintenance regimen being used to achieve better control of airways disease with exertion.   mucinex dm max dose and flutter use  zpak prn purulent sputum

## 2020-05-16 NOTE — Assessment & Plan Note (Signed)
Onset 2016 assoc with empyema on R  - swallowing eval 11/2018  Oropharyngeal dysphagia s asp/ no restrictions  - off lisinopril  08/13/19 - Allergy profile 12/13/19  >  Eos 0.3 /  IgE  58 - 12/13/2019 empirically try gerd rx x 6 weeks > no better then stopped  -   Sinus CT  02/18/20  Congested appearance of nasal mucosa  but  Negative for sinusitis - 05/15/2020 added back gerd rx x 6 weeks due to evening cough ? Related to meals  Suspect based on pseudowheeze on exam that he also has element of Upper airway cough syndrome (previously labeled PNDS),  is so named because it's frequently impossible to sort out how much is  CR/sinusitis with freq throat clearing (which can be related to primary GERD)   vs  causing  secondary (" extra esophageal")  GERD from wide swings in gastric pressure that occur with throat clearing, often  promoting self use of mint and menthol lozenges that reduce the lower esophageal sphincter tone and exacerbate the problem further in a cyclical fashion.   These are the same pts (now being labeled as having "irritable larynx syndrome" by some cough centers) who not infrequently have a history of having failed to tolerate ace inhibitors,  dry powder inhalers or biphosphonates or report having atypical/extraesophageal reflux symptoms that don't respond to standard doses of PPI  and are easily confused as having aecopd or asthma flares by even experienced allergists/ pulmonologists (myself included).   rx : diet/ Pantoprazole (protonix) 40 mg   Take  30-60 min before first meal of the day and Pepcid (famotidine)  20 mg one @  bedtime until return to office            Each maintenance medication was reviewed in detail including emphasizing most importantly the difference between maintenance and prns and under what circumstances the prns are to be triggered using an action plan format where appropriate.  Total time for H and P, chart review, counseling, reviewing hfa device(s) and  generating customized AVS unique to this office visit / same day charting = 35 min

## 2020-05-18 DIAGNOSIS — F4323 Adjustment disorder with mixed anxiety and depressed mood: Secondary | ICD-10-CM | POA: Diagnosis not present

## 2020-05-25 ENCOUNTER — Ambulatory Visit (HOSPITAL_COMMUNITY)
Admission: RE | Admit: 2020-05-25 | Discharge: 2020-05-25 | Disposition: A | Discharge status: Home / Self Care | Payer: Medicare Other | Source: Ambulatory Visit | Attending: Gastroenterology | Admitting: Gastroenterology

## 2020-05-25 ENCOUNTER — Other Ambulatory Visit: Payer: Self-pay

## 2020-05-25 DIAGNOSIS — Z8719 Personal history of other diseases of the digestive system: Secondary | ICD-10-CM | POA: Diagnosis not present

## 2020-05-25 DIAGNOSIS — R634 Abnormal weight loss: Secondary | ICD-10-CM | POA: Diagnosis not present

## 2020-05-25 DIAGNOSIS — Z9889 Other specified postprocedural states: Secondary | ICD-10-CM | POA: Insufficient documentation

## 2020-05-25 DIAGNOSIS — K5900 Constipation, unspecified: Secondary | ICD-10-CM

## 2020-05-25 DIAGNOSIS — R140 Abdominal distension (gaseous): Secondary | ICD-10-CM

## 2020-05-25 DIAGNOSIS — R1031 Right lower quadrant pain: Secondary | ICD-10-CM | POA: Diagnosis not present

## 2020-05-25 DIAGNOSIS — G8929 Other chronic pain: Secondary | ICD-10-CM

## 2020-05-25 DIAGNOSIS — R109 Unspecified abdominal pain: Secondary | ICD-10-CM | POA: Diagnosis not present

## 2020-05-25 LAB — POCT I-STAT CREATININE: Creatinine, Ser: 1.1 mg/dL (ref 0.61–1.24)

## 2020-05-25 MED ORDER — IOHEXOL 300 MG/ML  SOLN
100.0000 mL | Freq: Once | INTRAMUSCULAR | Status: AC | PRN
  Administered 2020-05-25: 100 mL via INTRAVENOUS

## 2020-05-26 ENCOUNTER — Other Ambulatory Visit: Payer: Self-pay | Admitting: *Deleted

## 2020-05-26 DIAGNOSIS — R9389 Abnormal findings on diagnostic imaging of other specified body structures: Secondary | ICD-10-CM

## 2020-05-26 NOTE — Telephone Encounter (Signed)
Please advise on patient mychart message  1.  My GI ordered an abdominal CT scan, which was done on Monday, 3/7.   The results include some information about my lungs, and might be of interest to you.  (It may be nothing new.)   The report is available on Mychart.  2.    Please clarify the instructions in the 2/25 visit summary with regard to when the Pepsid ac is to to be taken.     3.   The staff was unable to show me how to use the accapella on 2/27 because there was none available in the office.   I figured out most of it, but I'm not  sure how to do a "huff cough".   Any suggestions?  Also, the directions say to disinfect the device twice a day.   Is that really necessary if I'm the only user?       Thanks for your help.                                  Pamelia Hoit

## 2020-05-28 ENCOUNTER — Other Ambulatory Visit: Payer: Self-pay

## 2020-05-28 ENCOUNTER — Encounter: Payer: Self-pay | Admitting: Internal Medicine

## 2020-05-28 ENCOUNTER — Ambulatory Visit (INDEPENDENT_AMBULATORY_CARE_PROVIDER_SITE_OTHER): Payer: Medicare Other | Admitting: Internal Medicine

## 2020-05-28 VITALS — BP 125/76 | HR 74 | Temp 98.2°F | Ht 72.0 in | Wt 174.6 lb

## 2020-05-28 DIAGNOSIS — K5900 Constipation, unspecified: Secondary | ICD-10-CM

## 2020-05-28 DIAGNOSIS — R140 Abdominal distension (gaseous): Secondary | ICD-10-CM | POA: Diagnosis not present

## 2020-05-28 DIAGNOSIS — R9389 Abnormal findings on diagnostic imaging of other specified body structures: Secondary | ICD-10-CM | POA: Diagnosis not present

## 2020-05-28 NOTE — Progress Notes (Signed)
Referring Provider: Celene Squibb, MD Primary Care Physician:  Celene Squibb, MD Primary GI:  Dr. Abbey Chatters  No chief complaint on file.   HPI:   [name] is a 75 y.o. male who presents to the office today for follow-up visit.  Currently being followed for constipation.  He was previously on Linzess 72 mcg daily and states this it was not enough.  Was increased to 145 mcg and states this caused him to have diarrhea.  He is currently taking this every other day now and is still in the trial.  Does note daily bloating especially after meals.  Also weight loss as well nearly 20 pounds.    He recently had a CT abdomen pelvis which showed a bladder lesion concerning for malignancy, several enlarged inguinal lymph nodes, as well as widespread areas of mucoid impaction in the right lungs previously seen.  States he has follow-up with urology already scheduled.  Past Medical History:  Diagnosis Date  . Acute systolic heart failure ([facility])   . COPD (chronic obstructive pulmonary disease) ([place])   . Enlarged prostate with lower urinary tract symptoms (LUTS)   . Hyperlipidemia   . Hypertension   . Low serum testosterone level   . PAF (paroxysmal atrial fibrillation) ([place])   . Pneumonia 05/2014  . Pyothorax without fistula ([place])   . Sciatica   . Shortness of breath dyspnea   . Solitary pulmonary nodule   . STEMI (ST elevation myocardial infarction) ([place])    09/27/17 PCI/DES x1 mLAD, PTCA of the diag branch. EF 30% Lifevest at discharge.   . Testicular hypofunction   . Urinary frequency     Past Surgical History:  Procedure Laterality Date  . APPENDECTOMY    . BACK SURGERY    . COLONOSCOPY  2011   Tubular adenoma per patient  . COLONOSCOPY N/A 03/03/2015   Dr. Oneida Alar: Left colon is redundant, mild diverticulosis, small internal hemorrhoids, moderate external hemorrhoids.  Next colonoscopy in 5 years with overtube.  . COLONOSCOPY N/A 03/25/2019   Procedure: COLONOSCOPY;  Surgeon:  Danie Binder, MD;  [facility];  Service: Endoscopy;  Laterality: N/A;  8:30am - Camille's request  . CORONARY/GRAFT ACUTE MI REVASCULARIZATION N/A 09/27/2017   Procedure: Coronary/Graft Acute MI Revascularization;  Surgeon: Burnell Blanks, MD;  Location: [facility];  Service: Cardiovascular;  Laterality: N/A;  . EMPYEMA DRAINAGE Right 06/27/2014   Procedure: EMPYEMA DRAINAGE;  Surgeon: Grace Isaac, MD;  Location: [facility];  Service: Thoracic;  Laterality: Right;  . HEMORRHOID BANDING N/A 03/03/2015   Procedure: HEMORRHOID BANDING;  Surgeon: Danie Binder, MD;  [facility]: [facility];  Service: Endoscopy;  Laterality: N/A;  Recomended to see a Psychologist, sport and exercise.  [name]    . INTRAVASCULAR ULTRASOUND/IVUS N/A 09/27/2017   Procedure: Intravascular Ultrasound/IVUS;  Surgeon: Burnell Blanks, MD;  Location: [place];  Service: Cardiovascular;  Laterality: N/A;  . LEFT HEART CATH AND CORONARY ANGIOGRAPHY N/A 09/27/2017   Procedure: LEFT HEART CATH AND CORONARY ANGIOGRAPHY;  Surgeon: Burnell Blanks, MD;  Location: [place] [facility];  Service: Cardiovascular;  Laterality: N/A;  . LUNG SURGERY  2016  . POLYPECTOMY  03/25/2019   Procedure: POLYPECTOMY;  Surgeon: Danie Binder, MD;  [facility];  Service: Endoscopy;;  . TEE WITHOUT CARDIOVERSION N/A 07/14/2014   Procedure: TRANSESOPHAGEAL ECHOCARDIOGRAM (TEE);  Surgeon: Sueanne Margarita, MD;  Location: [facility];  Service: Cardiovascular;  Laterality: N/A;  . VARICOSE VEIN SURGERY    . VIDEO ASSISTED THORACOSCOPY Right 06/27/2014   Procedure: VIDEO ASSISTED THORACOSCOPY;  Surgeon: Grace Isaac, MD;  Location: [facility];  Service: Thoracic;  Laterality: Right;  Marland Kitchen VIDEO BRONCHOSCOPY N/A 06/27/2014   Procedure: VIDEO BRONCHOSCOPY;  Surgeon: Grace Isaac, MD;  Location[facility];  Service: Thoracic;  Laterality: N/A;    Current Outpatient Medications  Medication Sig Dispense Refill  .  acetaminophen (TYLENOL) 325 MG tablet Take 650 mg by mouth every 6 (six) hours as needed.    Marland Kitchen albuterol (VENTOLIN HFA) 108 (90 Base) MCG/ACT inhaler Inhale 1-2 puffs into the lungs every 6 (six) hours as needed for wheezing or shortness of breath.    . ALPRAZolam (XANAX) 1 MG tablet Take 1 mg by mouth at bedtime.    Marland Kitchen aspirin EC 81 MG tablet Take 1 tablet (81 mg total) by mouth daily. 90 tablet 3  . Calcium Citrate-Vitamin D (CALCIUM CITRATE + D3 PO) Take 1 tablet by mouth daily.    . carvedilol (COREG) 6.25 MG tablet Take 1 tablet (6.25 mg total) by mouth 2 (two) times daily with a meal. 180 tablet 3  . famotidine (PEPCID) 20 MG tablet Take 20 mg by mouth 2 (two) times daily.    Marland Kitchen linaclotide (LINZESS) 145 MCG CAPS capsule Take 1 capsule (145 mcg total) by mouth daily before breakfast. (Patient taking differently: Take 145 mcg by mouth every other day.) 90 capsule 1  . losartan (COZAAR) 25 MG tablet TAKE 1/2 TABLET(12.5 MG) BY MOUTH DAILY 15 tablet 0  . melatonin 1 MG TABS tablet Take 3 mg by mouth at bedtime. Takes 4.5 mg per day    . Multiple Vitamins-Minerals (SENIOR VITES PO) Take 1 tablet by mouth daily.    . nitroGLYCERIN (NITROSTAT) 0.4 MG SL tablet Place 1 tablet (0.4 mg total) under the tongue every 5 (five) minutes as needed. 25 tablet 2  . NON FORMULARY Fluc2% toe nail drops--apply topically once daily for toenail fungus    . omeprazole (PRILOSEC) 20 MG capsule Take 20 mg by mouth daily.    . rosuvastatin (CRESTOR) 40 MG tablet Take 1 tablet (40 mg total) by mouth daily. 90 tablet 3  . sertraline (ZOLOFT) 50 MG tablet Take 50 mg by mouth at bedtime.    . tamsulosin (FLOMAX) 0.4 MG CAPS capsule Take 1 capsule (0.4 mg total) by mouth daily. 30 capsule 2   No current facility-administered medications for this visit.    Allergies as of 05/28/2020  . (No Known Allergies)    Family History  Problem Relation Age of Onset  . Tuberculosis Father   . Hypertension Father   . COPD  Mother   . Hypertension Mother   . Colon cancer Neg Hx     Social History   Socioeconomic History  . Marital status: Married    Spouse name: Not on file  . Number of children: Not on file  . Years of education: Not on file  . Highest education level: Not on file  Occupational History  . Not on file  Tobacco Use  . Smoking status: Former Smoker    Packs/day: 1.00    Years: 10.00    Pack years: 10.00    Types: Pipe, Cigarettes    Quit date: 06/07/2014    Years since quitting: 5.9  . Smokeless tobacco: Never Used  . Tobacco comment: smoked pipe for 40 years, daily  Vaping Use  .  Vaping Use: Never used  Substance and Sexual Activity  . Alcohol use: Not Currently    Comment: Average 1-2 drinks of beer in the evening; 04/29/20 none since 01/2020  . Drug use: No  . Sexual activity: Not on file  Other Topics Concern  . Not on file  Social History Narrative  . Not on file   Social Determinants of Health   Financial Resource Strain: Not on file  Food Insecurity: Not on file  Transportation Needs: Not on file  Physical Activity: Not on file  Stress: Not on file  Social Connections: Not on file    Subjective: Review of Systems  Constitutional: Positive for malaise/fatigue. Negative for chills and fever.  HENT: Negative for congestion and hearing loss.   Eyes: Negative for blurred vision and double vision.  Respiratory: Negative for cough and shortness of breath.   Cardiovascular: Negative for chest pain and palpitations.  Gastrointestinal: Positive for constipation. Negative for abdominal pain, blood in stool, diarrhea, heartburn, melena and vomiting.       Bloating  Genitourinary: Negative for dysuria and urgency.  Musculoskeletal: Negative for joint pain and myalgias.  Skin: Negative for itching and rash.  Neurological: Negative for dizziness and headaches.  Psychiatric/Behavioral: Negative for depression. The patient is not nervous/anxious.      Objective: BP  125/76   Pulse 74   Temp 98.2 F (36.8 C) (Temporal)   Ht 6' (1.829 m)   Wt 174 lb 9.6 oz (79.2 kg)   BMI 23.68 kg/m  Physical Exam Constitutional:      Appearance: Normal appearance.  HENT:     Head: Normocephalic and atraumatic.  Eyes:     Extraocular Movements: Extraocular movements intact.     Conjunctiva/sclera: Conjunctivae normal.  Cardiovascular:     Rate and Rhythm: Normal rate and regular rhythm.  Pulmonary:     Effort: Pulmonary effort is normal.     Breath sounds: Normal breath sounds.  Abdominal:     General: Bowel sounds are normal.     Palpations: Abdomen is soft.  Musculoskeletal:        General: Normal range of motion.     Cervical back: Normal range of motion and neck supple.  Skin:    General: Skin is warm.  Neurological:     General: No focal deficit present.     Mental Status: He is alert and oriented to person, place, and time.  Psychiatric:        Mood and Affect: Mood normal.        Behavior: Behavior normal.      Assessment: *Abdominal bloating *Constipation *Abnormal CT scan-enlarged inguinal lymph nodes  Plan: Discussed constipation in depth with patient today.  Continue Linzess 145 mcg every other day.  For his bloating I will print off a low FODMAP diet.  Discussed potentially undergoing EGD and he would like to hold off for now.  I counseled that if he wishes to proceed to just call the office will get this scheduled.  In regards to the abnormal bladder lesion he has follow-up with urology.  Possible his inguinal lymph nodes are related to this.  Will await formal evaluation.  If urological work-up unremarkable, we will likely need to repeat imaging in 6 months or so to evaluate to the lymph nodes.  Follow-up in 3 months or sooner if needed.  05/28/2020 4:40 PM   Disclaimer: This note was dictated with voice recognition software. Similar sounding words can inadvertently be transcribed and  may not be corrected upon review.

## 2020-05-28 NOTE — Patient Instructions (Signed)
Continue to take Linzess 145 mcg every other day for your constipation.  For your bloating, I have printed off a low FODMAP diet which may cue you in on certain foods to avoid.  Follow-up with urology to discuss bladder lesion.  Possible the enlarged lymph nodes are related to this.  If not, we will repeat imaging in 6 months or so.  If still enlarged at that time and not related to bladder lesion we can consider colonoscopy.  If you decide you would like to undergo EGD to evaluate your bloating further, please call office and we will get you scheduled.  Otherwise follow-up in May.  At [facility] we value your feedback. You may receive a survey about your visit today. Please share your experience as we strive to create trusting relationships with our patients to provide genuine, compassionate, quality care.  We appreciate your understanding and patience as we review any laboratory studies, imaging, and other diagnostic tests that are ordered as we care for you. Our office policy is 5 business days for review of these results, and any emergent or urgent results are addressed in a timely manner for your best interest. If you do not hear from our office in 1 week, please contact us.   We also encourage the use of MyChart, which contains your medical information for your review as well. If you are not enrolled in this feature, an access code is on this after visit summary for your convenience. Thank you for allowing [place] to be involved in your care.  It was great to see you today!  I hope you have a great rest of your spring!!    Elon Alas. Abbey Chatters, D.O. Gastroenterology and Hepatology [facility]

## 2020-06-01 DIAGNOSIS — F4323 Adjustment disorder with mixed anxiety and depressed mood: Secondary | ICD-10-CM | POA: Diagnosis not present

## 2020-06-05 DIAGNOSIS — B351 Tinea unguium: Secondary | ICD-10-CM | POA: Diagnosis not present

## 2020-06-05 NOTE — H&P (View-Only) (Signed)
06/08/2020 9:13 AM   [name] [name] March 05, 1946 [id number]  Referring provider: Mahala Menghini, PA-C [address] [place],  [place] [address] Chief Complaint  Patient presents with  . Other    HPI: [name] [name] is a 75 y.o. male who presents today for evaluation of an abnormal CT scan. Patient is accompanied by his wife today.   - CT A/P w/ contrast on 05/25/2020 ordered for evaluation of rlq abd pain and wt loss incidentally showed a small lesion measuring 1.3 cm in the urinary bladder concerning for potential urothelial neoplasm.  - There were several enlarged inguinal lymph nodes. - Patient's wife is concerned about any unexpected weight loss. - Patient has an extensive 15 yr. smoking history. He stopped smoking in 2016 following major lung surgery.    PMH: Past Medical History:  Diagnosis Date  . Acute systolic heart failure ([place])   . COPD (chronic obstructive pulmonary disease) ([place])   . Enlarged prostate with lower urinary tract symptoms (LUTS)   . Hyperlipidemia   . Hypertension   . Low serum testosterone level   . PAF (paroxysmal atrial fibrillation) ([place])   . Pneumonia 05/2014  . Pyothorax without fistula ([place])   . Sciatica   . Shortness of breath dyspnea   . Solitary pulmonary nodule   . STEMI (ST elevation myocardial infarction) ([place])    09/27/17 PCI/DES x1 mLAD, PTCA of the diag branch. EF 30% Lifevest at discharge.   . Testicular hypofunction   . Urinary frequency     Surgical History: Past Surgical History:  Procedure Laterality Date  . APPENDECTOMY    . BACK SURGERY    . COLONOSCOPY  2011   Tubular adenoma per patient  . COLONOSCOPY N/A 03/03/2015   Dr. Oneida Alar: Left colon is redundant, mild diverticulosis, small internal hemorrhoids, moderate external hemorrhoids.  Next colonoscopy in 5 years with overtube.  . COLONOSCOPY N/A 03/25/2019   Procedure: COLONOSCOPY;  Surgeon: Danie Binder, MD;  [facility];  Service: Endoscopy;   Laterality: N/A;  8:30am - Camille's request  . CORONARY/GRAFT ACUTE MI REVASCULARIZATION N/A 09/27/2017   Procedure: Coronary/Graft Acute MI Revascularization;  Surgeon: Burnell Blanks, MD;  Location: [facility];  Service: Cardiovascular;  Laterality: N/A;  . EMPYEMA DRAINAGE Right 06/27/2014   Procedure: EMPYEMA DRAINAGE;  Surgeon: Grace Isaac, MD;  Location: [facility];  Service: Thoracic;  Laterality: Right;  . HEMORRHOID BANDING N/A 03/03/2015   Procedure: HEMORRHOID BANDING;  Surgeon: Danie Binder, MD;  [facility];  Service: Endoscopy;  Laterality: N/A;  Recomended to see a Psychologist, sport and exercise.  [name]    . INTRAVASCULAR ULTRASOUND/IVUS N/A 09/27/2017   Procedure: Intravascular Ultrasound/IVUS;  Surgeon: Burnell Blanks, MD;  Location: [place] [facility];  Service: Cardiovascular;  Laterality: N/A;  . LEFT HEART CATH AND CORONARY ANGIOGRAPHY N/A 09/27/2017   Procedure: LEFT HEART CATH AND CORONARY ANGIOGRAPHY;  Surgeon: Burnell Blanks, MD;  Location: [place] [facility];  Service: Cardiovascular;  Laterality: N/A;  . LUNG SURGERY  2016  . POLYPECTOMY  03/25/2019   Procedure: POLYPECTOMY;  Surgeon: Danie Binder, MD;  [facility];  Service: Endoscopy;;  . TEE WITHOUT CARDIOVERSION N/A 07/14/2014   Procedure: TRANSESOPHAGEAL ECHOCARDIOGRAM (TEE);  Surgeon: Sueanne Margarita, MD;  Location: [facility];  Service: Cardiovascular;  Laterality: N/A;  . VARICOSE VEIN SURGERY    . VIDEO ASSISTED THORACOSCOPY Right 06/27/2014   Procedure: VIDEO ASSISTED THORACOSCOPY;  Surgeon: Grace Isaac, MD;  Location: [facility];  Service: Thoracic;  Laterality: Right;  Marland Kitchen VIDEO BRONCHOSCOPY N/A 06/27/2014   Procedure: VIDEO BRONCHOSCOPY;  Surgeon: Grace Isaac, MD;  Location: [facility];  Service: Thoracic;  Laterality: N/A;    Home Medications:  Allergies as of 06/08/2020   No Known Allergies     Medication List       Accurate as of June 08, 2020  9:13  AM. If you have any questions, ask your nurse or doctor.        acetaminophen 325 MG tablet Commonly known as: TYLENOL Take 650 mg by mouth every 6 (six) hours as needed.   albuterol 108 (90 Base) MCG/ACT inhaler Commonly known as: VENTOLIN HFA Inhale 1-2 puffs into the lungs every 6 (six) hours as needed for wheezing or shortness of breath.   ALPRAZolam 1 MG tablet Commonly known as: XANAX Take 1 mg by mouth at bedtime.   aspirin EC 81 MG tablet Take 1 tablet (81 mg total) by mouth daily.   CALCIUM CITRATE + D3 PO Take 1 tablet by mouth daily.   carvedilol 6.25 MG tablet Commonly known as: COREG Take 1 tablet (6.25 mg total) by mouth 2 (two) times daily with a meal.   famotidine 20 MG tablet Commonly known as: PEPCID Take 20 mg by mouth 2 (two) times daily.   linaclotide 145 MCG Caps capsule Commonly known as: Linzess Take 1 capsule (145 mcg total) by mouth daily before breakfast. What changed: when to take this   losartan 25 MG tablet Commonly known as: COZAAR TAKE 1/2 TABLET(12.5 MG) BY MOUTH DAILY   melatonin 1 MG Tabs tablet Take 3 mg by mouth at bedtime. Takes 4.5 mg per day   nitroGLYCERIN 0.4 MG SL tablet Commonly known as: Nitrostat Place 1 tablet (0.4 mg total) under the tongue every 5 (five) minutes as needed.   NON FORMULARY Fluc2% toe nail drops--apply topically once daily for toenail fungus   omeprazole 20 MG capsule Commonly known as: PRILOSEC Take 20 mg by mouth daily.   rosuvastatin 40 MG tablet Commonly known as: CRESTOR Take 1 tablet (40 mg total) by mouth daily.   SENIOR VITES PO Take 1 tablet by mouth daily.   sertraline 50 MG tablet Commonly known as: ZOLOFT Take 50 mg by mouth at bedtime.   tamsulosin 0.4 MG Caps capsule Commonly known as: FLOMAX Take 1 capsule (0.4 mg total) by mouth daily.       Allergies: No Known Allergies  Family History: Family History  Problem Relation Age of Onset  . Tuberculosis Father   .  Hypertension Father   . COPD Mother   . Hypertension Mother   . Colon cancer Neg Hx     Social History:  reports that he quit smoking about 6 years ago. His smoking use included pipe and cigarettes. He has a 10.00 pack-year smoking history. He has never used smokeless tobacco. He reports previous alcohol use. He reports that he does not use drugs.   Physical Exam: BP (!) 99/59   Pulse 68   Ht 6' (1.829 m)   Wt 170 lb (77.1 kg)   BMI 23.06 kg/m   Constitutional:  Alert and oriented, No acute distress. HEENT: [place] AT, moist mucus membranes.  Trachea midline, no masses. Cardiovascular: No clubbing, cyanosis, or edema. Respiratory: Normal respiratory effort, no increased work of breathing. Skin: No rashes, bruises or suspicious lesions. Neurologic: Grossly intact, no focal deficits, moving all  4 extremities. Psychiatric: Normal mood and affect.  Laboratory Data:  Lab Results  Component Value Date   CREATININE 1.10 05/25/2020    Pertinent Imaging:  Images personally reviewed and interpreted  CLINICAL DATA:  75 year old male with history of abdominal distension and right lower quadrant abdominal pain. 20 pounds of unintentional weight loss since November 2021.  EXAM: CT ABDOMEN AND PELVIS WITH CONTRAST  TECHNIQUE: Multidetector CT imaging of the abdomen and pelvis was performed using the standard protocol following bolus administration of intravenous contrast.  CONTRAST:  148mL OMNIPAQUE IOHEXOL 300 MG/ML  SOLN  COMPARISON:  CT of the chest, abdomen and pelvis 09/27/2017.  FINDINGS: Lower chest: Extensive mucoid impaction within bronchi of the right middle and right lower lobes with widespread areas of peribronchovascular micro nodularity which are most compatible with distal areas of mucoid impaction. Atherosclerotic calcifications in the descending thoracic aorta as well as the right coronary artery. Calcifications of the aortic valve and mitral  annulus.  Hepatobiliary: Several subcentimeter low-attenuation lesions scattered throughout the liver, too small to characterize, but statistically likely to represent tiny cysts. No larger more suspicious appearing hepatic lesions. No intra or extrahepatic biliary ductal dilatation. Gallbladder is normal in appearance.  Pancreas: No pancreatic mass. No pancreatic ductal dilatation. No pancreatic or peripancreatic fluid collections or inflammatory changes.  Spleen: Unremarkable.  Adrenals/Urinary Tract: 1.4 cm low-attenuation lesion in the upper pole of the left kidney is compatible with a simple cyst. Right kidney and bilateral adrenal glands are normal in appearance. No hydroureteronephrosis. In the superolateral aspect of the right-side of the urinary bladder (axial image 73 of series 2 and sagittal image 52 of series 8) there is a well-circumscribed 1.3 cm mural lesion concerning for potential urothelial neoplasm.  Stomach/Bowel: The appearance of the stomach is normal. There is no pathologic dilatation of small bowel or colon. Numerous colonic diverticulae are noted, without surrounding inflammatory changes to suggest an acute diverticulitis at this time. The appendix is not confidently identified and may be surgically absent. Regardless, there are no inflammatory changes noted adjacent to the cecum to suggest the presence of an acute appendicitis at this time.  Vascular/Lymphatic: Aortic atherosclerosis, without evidence of aneurysm or dissection in the abdominal or pelvic vasculature. No abdominal lymphadenopathy. Several prominent borderline enlarged and mildly enlarged right inguinal lymph nodes are noted, measuring up to 1.2 cm in short axis (axial image 79 of series 2).  Reproductive: Prostate gland and seminal vesicles are unremarkable in appearance.  Other: No significant volume of ascites.  No pneumoperitoneum.  Musculoskeletal: Status post PLIF at  L4-L5 with interbody cage at L4-L5 interspace. There are no aggressive appearing lytic or blastic lesions noted in the visualized portions of the skeleton.  IMPRESSION: 1. Small lesion measuring 1.3 cm in the urinary bladder concerning for potential urothelial neoplasm. Referral to Urology for further clinical evaluation is recommended in the near future. 2. Several prominent borderline enlarged and mildly enlarged right inguinal lymph nodes are noted. These are nonspecific but warrant close attention on follow-up studies. These appear slightly more prominent than prior examination from September 27, 2017. 3. Widespread areas of mucoid impaction in the inferior aspect of the right lung. 4. Colonic diverticulosis without evidence of acute diverticulitis at this time. 5. Additional incidental findings, as above.   Electronically Signed   By: Vinnie Langton M.D.   On: 05/25/2020 08:58   Assessment & Plan:    1. Bladder lesion - CT A/P w/ contrast on 05/25/2020 showed a small lesion  measuring 1.3 cm in the urinary bladder concerning for potential urothelial neoplasm.   - We discussed in office cystoscopy and if lesion confirmed scheduling TURBT.     - The alternative of cystoscopy under anesthesia with possible TURBT was also discussed and he has elected this option. Risks of bleeding, infection, damage surrounding structures, injury to the bladder/ urethral, bladder neck contracture, ureteral stricture, retrograde ejaculation, stress/ urge incontinence, exacerbation of irritative voiding symptoms were all discussed in detail.  Fransico Him, am acting as a scribe for Dr. Nicki Reaper C. Catha Ontko,  I have reviewed the above documentation for accuracy and completeness, and I agree with the above.    [name], [place] [address], [place], [place] [address] [phone]

## 2020-06-05 NOTE — Progress Notes (Signed)
06/08/2020 9:13 AM   [name] [name] Aug 28, 1945 [id number]  Referring provider: Mahala Menghini, PA-C [address] [place],  [place] [address] Chief Complaint  Patient presents with  . Other    HPI: [name] [name] is a 75 y.o. male who presents today for evaluation of an abnormal CT scan. Patient is accompanied by his wife today.   - CT A/P w/ contrast on 05/25/2020 ordered for evaluation of rlq abd pain and wt loss incidentally showed a small lesion measuring 1.3 cm in the urinary bladder concerning for potential urothelial neoplasm.  - There were several enlarged inguinal lymph nodes. - Patient's wife is concerned about any unexpected weight loss. - Patient has an extensive 73 yr. smoking history. He stopped smoking in 2016 following major lung surgery.    PMH: Past Medical History:  Diagnosis Date  . Acute systolic heart failure ([place])   . COPD (chronic obstructive pulmonary disease) ([place])   . Enlarged prostate with lower urinary tract symptoms (LUTS)   . Hyperlipidemia   . Hypertension   . Low serum testosterone level   . PAF (paroxysmal atrial fibrillation) ([place])   . Pneumonia 05/2014  . Pyothorax without fistula ([place])   . Sciatica   . Shortness of breath dyspnea   . Solitary pulmonary nodule   . STEMI (ST elevation myocardial infarction) ([place])    09/27/17 PCI/DES x1 mLAD, PTCA of the diag branch. EF 30% Lifevest at discharge.   . Testicular hypofunction   . Urinary frequency     Surgical History: Past Surgical History:  Procedure Laterality Date  . APPENDECTOMY    . BACK SURGERY    . COLONOSCOPY  2011   Tubular adenoma per patient  . COLONOSCOPY N/A 03/03/2015   Dr. Oneida Alar: Left colon is redundant, mild diverticulosis, small internal hemorrhoids, moderate external hemorrhoids.  Next colonoscopy in 5 years with overtube.  . COLONOSCOPY N/A 03/25/2019   Procedure: COLONOSCOPY;  Surgeon: Danie Binder, MD;  [facility];  Service: Endoscopy;   Laterality: N/A;  8:30am - Camille's request  . CORONARY/GRAFT ACUTE MI REVASCULARIZATION N/A 09/27/2017   Procedure: Coronary/Graft Acute MI Revascularization;  Surgeon: Burnell Blanks, MD;  Location: [facility];  Service: Cardiovascular;  Laterality: N/A;  . EMPYEMA DRAINAGE Right 06/27/2014   Procedure: EMPYEMA DRAINAGE;  Surgeon: Grace Isaac, MD;  Location: [facility];  Service: Thoracic;  Laterality: Right;  . HEMORRHOID BANDING N/A 03/03/2015   Procedure: HEMORRHOID BANDING;  Surgeon: Danie Binder, MD;  [facility];  Service: Endoscopy;  Laterality: N/A;  Recomended to see a Psychologist, sport and exercise.  [name]    . INTRAVASCULAR ULTRASOUND/IVUS N/A 09/27/2017   Procedure: Intravascular Ultrasound/IVUS;  Surgeon: Burnell Blanks, MD;  Location: [facility];  Service: Cardiovascular;  Laterality: N/A;  . LEFT HEART CATH AND CORONARY ANGIOGRAPHY N/A 09/27/2017   Procedure: LEFT HEART CATH AND CORONARY ANGIOGRAPHY;  Surgeon: Burnell Blanks, MD;  Location: [facility];  Service: Cardiovascular;  Laterality: N/A;  . LUNG SURGERY  2016  . POLYPECTOMY  03/25/2019   Procedure: POLYPECTOMY;  Surgeon: Danie Binder, MD;  [facility];  Service: Endoscopy;;  . TEE WITHOUT CARDIOVERSION N/A 07/14/2014   Procedure: TRANSESOPHAGEAL ECHOCARDIOGRAM (TEE);  Surgeon: Sueanne Margarita, MD;  [facility];  Service: Cardiovascular;  Laterality: N/A;  . VARICOSE VEIN SURGERY    . VIDEO ASSISTED THORACOSCOPY Right 06/27/2014   Procedure: VIDEO ASSISTED THORACOSCOPY;  Surgeon: Grace Isaac, MD;  Location: [facility];  Service: Thoracic;  Laterality: Right;  Marland Kitchen VIDEO BRONCHOSCOPY N/A 06/27/2014   Procedure: VIDEO BRONCHOSCOPY;  Surgeon: Grace Isaac, MD;  Location: [facility] OR;  Service: Thoracic;  Laterality: N/A;    Home Medications:  Allergies as of 06/08/2020   No Known Allergies     Medication List       Accurate as of June 08, 2020  9:13  AM. If you have any questions, ask your nurse or doctor.        acetaminophen 325 MG tablet Commonly known as: TYLENOL Take 650 mg by mouth every 6 (six) hours as needed.   albuterol 108 (90 Base) MCG/ACT inhaler Commonly known as: VENTOLIN HFA Inhale 1-2 puffs into the lungs every 6 (six) hours as needed for wheezing or shortness of breath.   ALPRAZolam 1 MG tablet Commonly known as: XANAX Take 1 mg by mouth at bedtime.   aspirin EC 81 MG tablet Take 1 tablet (81 mg total) by mouth daily.   CALCIUM CITRATE + D3 PO Take 1 tablet by mouth daily.   carvedilol 6.25 MG tablet Commonly known as: COREG Take 1 tablet (6.25 mg total) by mouth 2 (two) times daily with a meal.   famotidine 20 MG tablet Commonly known as: PEPCID Take 20 mg by mouth 2 (two) times daily.   linaclotide 145 MCG Caps capsule Commonly known as: Linzess Take 1 capsule (145 mcg total) by mouth daily before breakfast. What changed: when to take this   losartan 25 MG tablet Commonly known as: COZAAR TAKE 1/2 TABLET(12.5 MG) BY MOUTH DAILY   melatonin 1 MG Tabs tablet Take 3 mg by mouth at bedtime. Takes 4.5 mg per day   nitroGLYCERIN 0.4 MG SL tablet Commonly known as: Nitrostat Place 1 tablet (0.4 mg total) under the tongue every 5 (five) minutes as needed.   NON FORMULARY Fluc2% toe nail drops--apply topically once daily for toenail fungus   omeprazole 20 MG capsule Commonly known as: PRILOSEC Take 20 mg by mouth daily.   rosuvastatin 40 MG tablet Commonly known as: CRESTOR Take 1 tablet (40 mg total) by mouth daily.   SENIOR VITES PO Take 1 tablet by mouth daily.   sertraline 50 MG tablet Commonly known as: ZOLOFT Take 50 mg by mouth at bedtime.   tamsulosin 0.4 MG Caps capsule Commonly known as: FLOMAX Take 1 capsule (0.4 mg total) by mouth daily.       Allergies: No Known Allergies  Family History: Family History  Problem Relation Age of Onset  . Tuberculosis Father   .  Hypertension Father   . COPD Mother   . Hypertension Mother   . Colon cancer Neg Hx     Social History:  reports that he quit smoking about 6 years ago. His smoking use included pipe and cigarettes. He has a 10.00 pack-year smoking history. He has never used smokeless tobacco. He reports previous alcohol use. He reports that he does not use drugs.   Physical Exam: BP (!) 99/59   Pulse 68   Ht 6' (1.829 m)   Wt 170 lb (77.1 kg)   BMI 23.06 kg/m   Constitutional:  Alert and oriented, No acute distress. HEENT: [place] AT, moist mucus membranes.  Trachea midline, no masses. Cardiovascular: No clubbing, cyanosis, or edema. Respiratory: Normal respiratory effort, no increased work of breathing. Skin: No rashes, bruises or suspicious lesions. Neurologic: Grossly intact, no focal deficits, moving all  4 extremities. Psychiatric: Normal mood and affect.  Laboratory Data:  Lab Results  Component Value Date   CREATININE 1.10 05/25/2020    Pertinent Imaging:  Images personally reviewed and interpreted  CLINICAL DATA:  75 year old male with history of abdominal distension and right lower quadrant abdominal pain. 20 pounds of unintentional weight loss since November 2021.  EXAM: CT ABDOMEN AND PELVIS WITH CONTRAST  TECHNIQUE: Multidetector CT imaging of the abdomen and pelvis was performed using the standard protocol following bolus administration of intravenous contrast.  CONTRAST:  110mL OMNIPAQUE IOHEXOL 300 MG/ML  SOLN  COMPARISON:  CT of the chest, abdomen and pelvis 09/27/2017.  FINDINGS: Lower chest: Extensive mucoid impaction within bronchi of the right middle and right lower lobes with widespread areas of peribronchovascular micro nodularity which are most compatible with distal areas of mucoid impaction. Atherosclerotic calcifications in the descending thoracic aorta as well as the right coronary artery. Calcifications of the aortic valve and mitral  annulus.  Hepatobiliary: Several subcentimeter low-attenuation lesions scattered throughout the liver, too small to characterize, but statistically likely to represent tiny cysts. No larger more suspicious appearing hepatic lesions. No intra or extrahepatic biliary ductal dilatation. Gallbladder is normal in appearance.  Pancreas: No pancreatic mass. No pancreatic ductal dilatation. No pancreatic or peripancreatic fluid collections or inflammatory changes.  Spleen: Unremarkable.  Adrenals/Urinary Tract: 1.4 cm low-attenuation lesion in the upper pole of the left kidney is compatible with a simple cyst. Right kidney and bilateral adrenal glands are normal in appearance. No hydroureteronephrosis. In the superolateral aspect of the right-side of the urinary bladder (axial image 73 of series 2 and sagittal image 52 of series 8) there is a well-circumscribed 1.3 cm mural lesion concerning for potential urothelial neoplasm.  Stomach/Bowel: The appearance of the stomach is normal. There is no pathologic dilatation of small bowel or colon. Numerous colonic diverticulae are noted, without surrounding inflammatory changes to suggest an acute diverticulitis at this time. The appendix is not confidently identified and may be surgically absent. Regardless, there are no inflammatory changes noted adjacent to the cecum to suggest the presence of an acute appendicitis at this time.  Vascular/Lymphatic: Aortic atherosclerosis, without evidence of aneurysm or dissection in the abdominal or pelvic vasculature. No abdominal lymphadenopathy. Several prominent borderline enlarged and mildly enlarged right inguinal lymph nodes are noted, measuring up to 1.2 cm in short axis (axial image 79 of series 2).  Reproductive: Prostate gland and seminal vesicles are unremarkable in appearance.  Other: No significant volume of ascites.  No pneumoperitoneum.  Musculoskeletal: Status post PLIF at  L4-L5 with interbody cage at L4-L5 interspace. There are no aggressive appearing lytic or blastic lesions noted in the visualized portions of the skeleton.  IMPRESSION: 1. Small lesion measuring 1.3 cm in the urinary bladder concerning for potential urothelial neoplasm. Referral to Urology for further clinical evaluation is recommended in the near future. 2. Several prominent borderline enlarged and mildly enlarged right inguinal lymph nodes are noted. These are nonspecific but warrant close attention on follow-up studies. These appear slightly more prominent than prior examination from September 27, 2017. 3. Widespread areas of mucoid impaction in the inferior aspect of the right lung. 4. Colonic diverticulosis without evidence of acute diverticulitis at this time. 5. Additional incidental findings, as above.   Electronically Signed   By: Vinnie Langton M.D.   On: 05/25/2020 08:58   Assessment & Plan:    1. Bladder lesion - CT A/P w/ contrast on 05/25/2020 showed a small lesion  measuring 1.3 cm in the urinary bladder concerning for potential urothelial neoplasm.   - We discussed in office cystoscopy and if lesion confirmed scheduling TURBT.     - The alternative of cystoscopy under anesthesia with possible TURBT was also discussed and he has elected this option. Risks of bleeding, infection, damage surrounding structures, injury to the bladder/ urethral, bladder neck contracture, ureteral stricture, retrograde ejaculation, stress/ urge incontinence, exacerbation of irritative voiding symptoms were all discussed in detail.  Fransico Him, am acting as a scribe for Dr. Nicki Reaper C. Sharnay Cashion,  I have reviewed the above documentation for accuracy and completeness, and I agree with the above.    [name], [place] [address], [place], [place] [address] [phone]

## 2020-06-08 ENCOUNTER — Encounter: Payer: Self-pay | Admitting: Urology

## 2020-06-08 ENCOUNTER — Other Ambulatory Visit: Payer: Self-pay

## 2020-06-08 ENCOUNTER — Ambulatory Visit (INDEPENDENT_AMBULATORY_CARE_PROVIDER_SITE_OTHER): Payer: Medicare Other | Admitting: Urology

## 2020-06-08 ENCOUNTER — Telehealth: Payer: Self-pay | Admitting: Cardiology

## 2020-06-08 ENCOUNTER — Other Ambulatory Visit: Payer: Self-pay | Admitting: Urology

## 2020-06-08 VITALS — BP 99/59 | HR 68 | Ht 72.0 in | Wt 170.0 lb

## 2020-06-08 DIAGNOSIS — N3289 Other specified disorders of bladder: Secondary | ICD-10-CM

## 2020-06-08 MED ORDER — GEMCITABINE CHEMO FOR BLADDER INSTILLATION 2000 MG: 2000.0000 mg | Freq: Once | INTRAVENOUS | Status: DC

## 2020-06-08 NOTE — Telephone Encounter (Signed)
   [facility] Pre-operative Risk Assessment    HEARTCARE STAFF: - Please ensure there is not already an duplicate clearance open for this procedure. - Under Visit Info/Reason for Call, type in Other and utilize the format Clearance MM/DD/YY or Clearance TBD. Do not use dashes or single digits. - If request is for dental extraction, please clarify the # of teeth to be extracted.  Request for surgical clearance:  1. What type of surgery is being performed?TURBT , POSS BIL RETROGRADE PYELOGRAM WITH BLADDER MASS   2. When is this surgery scheduled? 06/16/20  3. What type of clearance is required (medical clearance vs. Pharmacy clearance to hold med vs. Both)? BOTH  4. Are there any medications that need to be held prior to surgery and how long?ANTICOAGULANTS FOR 7 DAYS PRIOR  5. Practice name and name of physician performing surgery? [facility]   6. What is the office phone number? [phone]    7.   What is the office fax number? [phone]  8.   Anesthesia type (None, local, MAC, general) ? GENERAL   Jannet Askew 06/08/2020, 1:04 PM  _________________________________________________________________   (provider comments below)

## 2020-06-08 NOTE — Telephone Encounter (Signed)
Patient called back said someone called him but he did not know who or why. It may have been [employer]. Please let patient know what is going on. He said someone called him today to fill an opening but he could not be here in 63mins for 1 or n1:30 appt. He can be reached at [phone]

## 2020-06-08 NOTE — Telephone Encounter (Signed)
° °  Primary Cardiologist: Carlyle Dolly, MD  Chart reviewed as part of pre-operative protocol coverage. Patient was contacted 06/08/2020 in reference to pre-operative risk assessment for pending surgery as outlined below.  [name] [name] was last seen on 11/2019 by Dr. Harl Bowie. Cardiac history outlined with CAD with STEMI 09/2017 s/p DES to LAD and PTCA of diag, ICM EF 59-13%, chronic systolic CHF, afib (around time of MI without recurrence on event monitor), HLD. Last echo 02/2019 EF 40-45%.  Per last OV note, "given afib was started on plavix, asa, and anticoag x 1 month, then plavix and anticoag" /"02/2018 event monitor without recurrent afib, eliquis was stopped."   Most recent med list has ASA 81mg  daily. Last time I see Plavix on med list was 01/2019. Will forward to Dr. Harl Bowie for review to ensure the plan was for him to be on ASA daily, and for input on holding if needed for procedure scheduled 06/16/20. Dr. Harl Bowie - Please route response to [facility] PREOP (the pre-op pool). Thank you.  Charlie Pitter, PA-C 06/08/2020, 1:39 PM

## 2020-06-09 ENCOUNTER — Encounter: Payer: Self-pay | Admitting: Urology

## 2020-06-09 ENCOUNTER — Telehealth: Payer: Self-pay | Admitting: General Practice

## 2020-06-09 NOTE — Telephone Encounter (Signed)
Returned call to patient and reviewed recommendations from preoperative cardiac evaluation.  Patient expressed understanding.  No further questions at this time.

## 2020-06-09 NOTE — Telephone Encounter (Signed)
Should be on aspirin 81mg  daily alone, if neccesary ok to hold for procedure. Ok to proceed with procedure from cardiac standpoint   Zandra Abts MD

## 2020-06-09 NOTE — Telephone Encounter (Signed)
Patient had called earlier in the day with the same issue. He was told that we did not call him and that we would message D.Dunn PA_c.

## 2020-06-09 NOTE — Telephone Encounter (Signed)
   Primary Cardiologist: Carlyle Dolly, MD  Chart reviewed as part of pre-operative protocol coverage. Given past medical history and time since last visit, based on ACC/AHA guidelines, KAELAN AMBLE would be at acceptable risk for the planned procedure without further cardiovascular testing.   His aspirin may be held for 7 days prior to his procedure.  Please resume as soon as hemostasis is achieved at the discretion of surgeon.  I will route this recommendation to the requesting party via Epic fax function and remove from pre-op pool.  Please call with questions.  Jossie Ng. Yehoshua Vitelli NP-C    06/09/2020, 11:33 AM [place] [address] Office [phone] Fax [phone]

## 2020-06-11 ENCOUNTER — Other Ambulatory Visit
Admission: RE | Admit: 2020-06-11 | Discharge: 2020-06-11 | Disposition: A | Discharge status: Home / Self Care | Payer: Medicare Other | Source: Home / Self Care | Attending: Urology | Admitting: Urology

## 2020-06-11 ENCOUNTER — Other Ambulatory Visit: Payer: Self-pay | Admitting: Urology

## 2020-06-11 ENCOUNTER — Other Ambulatory Visit
Admission: RE | Admit: 2020-06-11 | Discharge: 2020-06-11 | Disposition: A | Discharge status: Home / Self Care | Payer: Medicare Other | Source: Ambulatory Visit | Attending: Urology | Admitting: Urology

## 2020-06-11 ENCOUNTER — Other Ambulatory Visit: Payer: Self-pay

## 2020-06-11 ENCOUNTER — Ambulatory Visit: Payer: Medicare Other | Admitting: Family Medicine

## 2020-06-11 ENCOUNTER — Encounter: Payer: Self-pay | Admitting: Urology

## 2020-06-11 DIAGNOSIS — Z20822 Contact with and (suspected) exposure to covid-19: Secondary | ICD-10-CM | POA: Insufficient documentation

## 2020-06-11 DIAGNOSIS — N3289 Other specified disorders of bladder: Secondary | ICD-10-CM | POA: Insufficient documentation

## 2020-06-11 DIAGNOSIS — Z01812 Encounter for preprocedural laboratory examination: Secondary | ICD-10-CM | POA: Insufficient documentation

## 2020-06-11 LAB — SARS CORONAVIRUS 2 (TAT 6-24 HRS): SARS Coronavirus 2: NEGATIVE

## 2020-06-11 NOTE — Patient Instructions (Addendum)
Your procedure is scheduled on: 06/16/20- Tuesday Report to the Registration Desk on the 1st floor of the [facility]. To find out your arrival time, please call [phone] between 1PM - 3PM on: 06/15/20- Monday  REMEMBER: Instructions that are not followed completely may result in serious medical risk, up to and including death; or upon the discretion of your surgeon and anesthesiologist your surgery may need to be rescheduled.  Do not eat food or any fluids after midnight the night before surgery.  No gum chewing, lozengers or hard candies.  TAKE THESE MEDICATIONS THE MORNING OF SURGERY WITH A SIP OF WATER:  - LUBRICATING EYE DROPS OP - carvedilol (COREG) 6.25 MG tablet - omeprazole (PRILOSEC) 20  on the morning of surgery - helps to prevent nausea after surgery. - tamsulosin (FLOMAX) 0.4 MG CAPS capsule  Use inhaler albuterol (VENTOLIN HFA) 108 (90 Base) MCG/ACT inhaler on the day of surgery and bring to the hospital.  Follow recommendations from Cardiologist, Pulmonologist or PCP regarding stopping Aspirin, Coumadin, Plavix, Eliquis, Pradaxa, or Pletal. Stop taking Aspirin beginning 06/11/20.  One week prior to surgery: Stop Anti-inflammatories (NSAIDS) such as Advil, Aleve, Ibuprofen, Motrin, Naproxen, Naprosyn and Aspirin based products such as Excedrin, Goodys Powder, BC Powder.  Stop ANY OVER THE COUNTER supplements until after surgery.  No Alcohol for 24 hours before or after surgery.  No Smoking including e-cigarettes for 24 hours prior to surgery.  No chewable tobacco products for at least 6 hours prior to surgery.  No nicotine patches on the day of surgery.  Do not use any "recreational" drugs for at least a week prior to your surgery.  Please be advised that the combination of cocaine and anesthesia may have negative outcomes, up to and including death. If you test positive for cocaine, your surgery will be cancelled.  On the morning of surgery brush your teeth  with toothpaste and water, you may rinse your mouth with mouthwash if you wish. Do not swallow any toothpaste or mouthwash.  Do not wear jewelry, make-up, hairpins, clips or nail polish.  Do not wear lotions, powders, or perfumes.   Do not shave body from the neck down 48 hours prior to surgery just in case you cut yourself which could leave a site for infection.  Also, freshly shaved skin may become irritated if using the CHG soap.  Contact lenses, hearing aids and dentures may not be worn into surgery.  Do not bring valuables to the hospital. [facility] is not responsible for any missing/lost belongings or valuables.   Notify your doctor if there is any change in your medical condition (cold, fever, infection).  Wear comfortable clothing (specific to your surgery type) to the hospital.  Plan for stool softeners for home use; pain medications have a tendency to cause constipation. You can also help prevent constipation by eating foods high in fiber such as fruits and vegetables and drinking plenty of fluids as your diet allows.  After surgery, you can help prevent lung complications by doing breathing exercises.  Take deep breaths and cough every 1-2 hours. Your doctor may order a device called an Incentive Spirometer to help you take deep breaths. When coughing or sneezing, hold a pillow firmly against your incision with both hands. This is called "splinting." Doing this helps protect your incision. It also decreases belly discomfort.  If you are being admitted to the hospital overnight, leave your suitcase in the car. After surgery it may be brought to your room.  If you are being discharged the day of surgery, you will not be allowed to drive home. You will need a responsible adult (18 years or older) to drive you home and stay with you that night.   If you are taking public transportation, you will need to have a responsible adult (18 years or older) with you. Please confirm  with your physician that it is acceptable to use public transportation.   Please call the [facility] at [phone] if you have any questions about these instructions.  Surgery Visitation Policy:  Patients undergoing a surgery or procedure may have one family member or support person with them as long as that person is not COVID-19 positive or experiencing its symptoms.  That person may remain in the waiting area during the procedure.  Inpatient Visitation:    Visiting hours are 7 a.m. to 8 p.m. Inpatients will be allowed two visitors daily. The visitors may change each day during the patient's stay. No visitors under the age of 40. Any visitor under the age of 1 must be accompanied by an adult. The visitor must pass COVID-19 screenings, use hand sanitizer when entering and exiting the patient's room and wear a mask at all times, including in the patient's room. Patients must also wear a mask when staff or their visitor are in the room. Masking is required regardless of vaccination status.

## 2020-06-12 ENCOUNTER — Other Ambulatory Visit: Payer: Medicare Other

## 2020-06-12 LAB — URINE CULTURE: Culture: NO GROWTH

## 2020-06-13 ENCOUNTER — Encounter: Payer: Self-pay | Admitting: Urology

## 2020-06-13 NOTE — Progress Notes (Signed)
Perioperative Services  Pre-Admission/Anesthesia Testing Clinical Review  Date: 06/13/20  Patient Demographics:  Name: [name] [name] DOB:   1945-12-27 MRN:   [id number]  Planned Surgical Procedure(s):    Case: [id number] Date/Time: 06/16/20 0907   Procedures:      TRANSURETHRAL RESECTION OF BLADDER TUMOR (TURBT) WITH GEMCITABINE (N/A )     CYSTOSCOPY WITH RETROGRADE PYELOGRAM (Bilateral )   Anesthesia type: Choice   Pre-op diagnosis: bLADDER mASS   Location: [facility] OR ROOM 10 / [facility]   Surgeons: [name], MD    NOTE: Available PAT nursing documentation and vital signs have been reviewed. Clinical nursing staff has updated patient's PMH/PSHx, current medication list, and drug allergies/intolerances to ensure comprehensive history available to assist in medical decision making as it pertains to the aforementioned surgical procedure and anticipated anesthetic course.   Clinical Discussion:  [name] [name] is a 75 y.o. male who is submitted for pre-surgical anesthesia review and clearance prior to him undergoing the above procedure. Patient is a Former Smoker (10 pack years; quit 05/2014). Pertinent PMH includes: STEMI, paroxysmal atrial fibrillation, systolic heart failure, HTN, HLD, GERD (on daily PPI), COPD, lower back pain (s/p lumbar fusion), BPH with associated LUTS, insomnia, anxiety (on BZO).   Patient is followed by cardiology Harl Bowie, MD). He was last seen in the cardiology clinic on 12/18/2019; notes reviewed.  At the time of his clinic visit, patient noted to be doing well overall from a cardiovascular perspective. He denied chest pain, increased shortness of breath, orthopnea, PND, palpitations, peripheral edema, vertiginous symptoms, and presyncope/syncope.  Patient with chronic shortness of breath related to an underlying COPD diagnosis.  He is followed by PCCM Melvyn Novas, MD); last seen on 05/16/2020. Patient with a significant past medical history.   Patient suffered an anterior wall STEMI and 09/2017.  Diagnostic heart catheterization at that time revealed two-vessel CAD (100% proximal LAD and 100% ostial first diagonal).  Patient underwent PTCA and DES x1 to the mid LAD lesion, and PTCA alone to the ostium of the first diagonal branch.  Catheterization also demonstrated mild nonobstructive disease of the RCA, LCx, and mid LAD. TTE at that time revealed an LVEF of 30-35%.  Patient was discharged home on a LifeVest.  Repeat TTE in 10/2017 revealed improvement of his ejection fraction to 35-40%, therefore the LifeVest was discontinued.  Patient's last TTE was performed in 02/2019 revealing a mildly reduced left ventricular systolic function (LVEF 76-54%) with grade 1 diastolic dysfunction.  Patient also has a history of paroxysmal atrial fibrillation.  CHA2DS2-VASc Score = 4 (age x 2, prior MI, CHF).  Patient initially on DAPT therapy (ASA + clopidogrel) following stent placement, however this has since been discontinued. Patient on GDMT for his HTN and HLD diagnoses.  Blood pressure well controlled at 120/60 on currently prescribed beta-blocker and ARB therapies.  Patient is on a statin for his HLD. Functional capacity, as defined by DASI, is documented as being >/= 4 METS.  No changes were made to patient's medication regimen.  Patient to follow-up with outpatient cardiology at defined intervals for ongoing management.  Patient is scheduled to undergo TURBT with intravesicular chemotherapy instillation on 06/16/2020 with Dr. John Giovanni.  Given patient's past medical history significant for cardiovascular disease and intervention, presurgical cardiac clearance was sought by the performing surgeon's office and PAT team. Per cardiology, "based on patient's past medical history and time since his last clinic visit, patient would be at an overall ACCEPTABLE risk  for the planned procedure without further cardiovascular testing or intervention at this time".   Patient currently on daily low-dose ASA therapy.  Patient has been instructed on recommendations from cardiology for holding his ASA dose for 5 days prior to his procedure.  The patient is aware that his last dose of ASA will be on 06/11/2020.  Patient denies previous perioperative complications with anesthesia in the past. In review of the available records, it is noted that patient underwent a general anesthetic course at [facility] (ASA III) in 07/2017 without documented complications.   Vitals with BMI 06/08/2020 05/28/2020 05/15/2020  Height 6\' 0"  6\' 0"  6\' 0"   Weight 170 lbs 174 lbs 10 oz 174 lbs 10 oz  BMI 23.05 83.15 17.61  Systolic 99 607 371  Diastolic 59 76 70  Pulse 68 74 70    Providers/Specialists:   NOTE: Primary physician provider listed below. Patient may have been seen by APP or partner within same practice.   PROVIDER ROLE / SPECIALTY LAST Claud Kelp, MD Urology (Surgeon)  06/08/2020  Celene Squibb, MD Primary Care Provider  ???  Carlyle Dolly, MD Cardiology  12/18/2019  Christinia Gully, MD Pulmonary Medicine  05/15/2020   Allergies:  Patient has no known allergies.  Current Home Medications:   . gemcitabine (GEMZAR) chemo syringe for bladder instillation 2,000 mg   . acetaminophen (TYLENOL) 500 MG tablet  . albuterol (VENTOLIN HFA) 108 (90 Base) MCG/ACT inhaler  . ALPRAZolam (XANAX) 1 MG tablet  . aspirin EC 81 MG tablet  . Calcium Citrate-Vitamin D (CALCIUM CITRATE + D3 PO)  . Carboxymethylcellul-Glycerin (LUBRICATING EYE DROPS OP)  . carvedilol (COREG) 6.25 MG tablet  . famotidine (PEPCID) 20 MG tablet  . linaclotide (LINZESS) 145 MCG CAPS capsule  . losartan (COZAAR) 25 MG tablet  . melatonin 3 MG TABS tablet  . Multiple Vitamins-Minerals (SENIOR VITES PO)  . neomycin-bacitracin-polymyxin (NEOSPORIN) ointment  . nitroGLYCERIN (NITROSTAT) 0.4 MG SL tablet  . NON FORMULARY  . omeprazole (PRILOSEC) 20 MG capsule  . rosuvastatin  (CRESTOR) 40 MG tablet  . sertraline (ZOLOFT) 50 MG tablet  . tamsulosin (FLOMAX) 0.4 MG CAPS capsule  . Vitamins A & D (VITAMIN A & D) ointment   History:   Past Medical History:  Diagnosis Date  . Acute systolic heart failure ([place])   . Anxiety   . COPD (chronic obstructive pulmonary disease) ([place])   . Enlarged prostate with lower urinary tract symptoms (LUTS)   . GERD (gastroesophageal reflux disease)   . Hyperlipidemia   . Hypertension   . Low serum testosterone level   . PAF (paroxysmal atrial fibrillation) ([place])   . Pneumonia 05/2014  . Pyothorax without fistula ([place])   . Sciatica   . Shortness of breath dyspnea   . Solitary pulmonary nodule   . STEMI (ST elevation myocardial infarction) ([place])    09/27/17 PCI/DES x1 mLAD, PTCA of the diag branch. EF 30% Lifevest at discharge.   . Testicular hypofunction   . Urinary frequency    Past Surgical History:  Procedure Laterality Date  . APPENDECTOMY    . BACK SURGERY    . COLONOSCOPY  2011   Tubular adenoma per patient  . COLONOSCOPY N/A 03/03/2015   Dr. Oneida Alar: Left colon is redundant, mild diverticulosis, small internal hemorrhoids, moderate external hemorrhoids.  Next colonoscopy in 5 years with overtube.  . COLONOSCOPY N/A 03/25/2019   Procedure: COLONOSCOPY;  Surgeon: Danie Binder, MD;  [facility];  Service: Endoscopy;  Laterality: N/A;  8:30am - Camille's request  . CORONARY/GRAFT ACUTE MI REVASCULARIZATION N/A 09/27/2017   Procedure: Coronary/Graft Acute MI Revascularization;  Surgeon: Burnell Blanks, MD;  Location: [place] [facility] LAB;  Service: Cardiovascular;  Laterality: N/A;  . EMPYEMA DRAINAGE Right 06/27/2014   Procedure: EMPYEMA DRAINAGE;  Surgeon: Grace Isaac, MD;  Location: [place];  Service: Thoracic;  Laterality: Right;  . HEMORRHOID BANDING N/A 03/03/2015   Procedure: HEMORRHOID BANDING;  Surgeon: Danie Binder, MD;  [facility];  Service: Endoscopy;  Laterality: N/A;   Recomended to see a Psychologist, sport and exercise.  [name]    . INTRAVASCULAR ULTRASOUND/IVUS N/A 09/27/2017   Procedure: Intravascular Ultrasound/IVUS;  Surgeon: Burnell Blanks, MD;  Location: [place] [facility];  Service: Cardiovascular;  Laterality: N/A;  . LEFT HEART CATH AND CORONARY ANGIOGRAPHY N/A 09/27/2017   Procedure: LEFT HEART CATH AND CORONARY ANGIOGRAPHY;  Surgeon: Burnell Blanks, MD;  Location: [place] [facility];  Service: Cardiovascular;  Laterality: N/A;  . LUNG SURGERY  2016  . POLYPECTOMY  03/25/2019   Procedure: POLYPECTOMY;  Surgeon: Danie Binder, MD;  [facility];  Service: Endoscopy;;  . TEE WITHOUT CARDIOVERSION N/A 07/14/2014   Procedure: TRANSESOPHAGEAL ECHOCARDIOGRAM (TEE);  Surgeon: Sueanne Margarita, MD;  Location: [facility] ENDOSCOPY;  Service: Cardiovascular;  Laterality: N/A;  . VARICOSE VEIN SURGERY    . VIDEO ASSISTED THORACOSCOPY Right 06/27/2014   Procedure: VIDEO ASSISTED THORACOSCOPY;  Surgeon: Grace Isaac, MD;  Location: [place];  Service: Thoracic;  Laterality: Right;  Marland Kitchen VIDEO BRONCHOSCOPY N/A 06/27/2014   Procedure: VIDEO BRONCHOSCOPY;  Surgeon: Grace Isaac, MD;  Location: [facility] OR;  Service: Thoracic;  Laterality: N/A;   Family History  Problem Relation Age of Onset  . Tuberculosis Father   . Hypertension Father   . COPD Mother   . Hypertension Mother   . Colon cancer Neg Hx    Social History   Tobacco Use  . Smoking status: Former Smoker    Packs/day: 1.00    Years: 10.00    Pack years: 10.00    Types: Pipe, Cigarettes    Quit date: 06/07/2014    Years since quitting: 6.0  . Smokeless tobacco: Never Used  . Tobacco comment: smoked pipe for 40 years, daily  Vaping Use  . Vaping Use: Never used  Substance Use Topics  . Alcohol use: Not Currently    Comment: Average 1-2 drinks of beer in the evening; 04/29/20 none since 01/2020  . Drug use: No    Pertinent Clinical Results:  LABS: Labs reviewed: Acceptable for  surgery.    Hospital Outpatient Visit on 06/11/2020  Component Date Value Ref Range Status  . SARS Coronavirus 2 06/11/2020 NEGATIVE  NEGATIVE Final   Comment: (NOTE) SARS-CoV-2 target nucleic acids are NOT DETECTED.  The SARS-CoV-2 RNA is generally detectable in upper and lower respiratory specimens during the acute phase of infection. Negative results do not preclude SARS-CoV-2 infection, do not rule out co-infections with other pathogens, and should not be used as the sole basis for treatment or other patient management decisions. Negative results must be combined with clinical observations, patient history, and epidemiological information. The expected result is Negative.  Fact Sheet for Patients: SugarRoll.be  Fact Sheet for Healthcare Providers: https://www.woods-mathews.com/  This test is not yet approved or cleared by the [place] FDA and  has been authorized for detection and/or diagnosis of SARS-CoV-2 by  FDA under an Emergency Use Authorization (EUA). This EUA will remain  in effect (meaning this test can be used) for the duration of the COVID-19 declaration under Se                          ction 564(b)(1) of the Act, 21 U.S.C. section 360bbb-3(b)(1), unless the authorization is terminated or revoked sooner.  Performed at [facility], [place] [address] [place], [place] [address]   Hospital Outpatient Visit on 06/11/2020  Component Date Value Ref Range Status  . Specimen Description 06/11/2020    Final                   Value:URINE, RANDOM Performed at [facility], [address] [place], [place] [address]   . Special Requests 06/11/2020    Final                   Value:NONE Performed at [facility], [address] [place], [place] [address]   . Culture 06/11/2020    Final                   Value:NO GROWTH Performed at [facility], [place] [address] [place], [place] [address]    . Report Status 06/11/2020 06/12/2020 FINAL   Final    ECG: Date: 12/18/2019 Time ECG obtained: 1013 AM Rate: 60 bpm Rhythm: Sinus rhythm with occasional PVCs, LAFB Axis (leads I and aVF): Left axis deviation Intervals: PR 156 ms. QRS 100 ms. QTc 434 ms. ST segment and T wave changes: No evidence of acute ST segment elevation or depression; evidence of age determine anterolateral infarct Comparison: Similar to previous tracing obtained on 03/06/2018   IMAGING / PROCEDURES: ECHOCARDIOGRAM performed on 02/22/2019 1. Left ventricular ejection fraction, by visual estimation, is 40 to 45%.  2. The left ventricle has mildly decreased function.  3. There is moderately increased left ventricular hypertrophy.  4. Elevated left atrial pressure. Left ventricular diastolic parameters are consistent with Grade I diastolic dysfunction (impaired relaxation).  5. Hypokinesis of the apex, mid to distal anteroseptal, apical lateral, and apical myocardium.  6. Global right ventricle has normal systolic function. 7. The right ventricular size is normal. No increase in right ventricular wall thickness.  8. Left atrial size was moderately dilated.  9. Right atrial size was normal.  10. The mitral valve is normal in structure. Mild mitral valve regurgitation. No evidence of mitral stenosis.  11. The tricuspid valve is normal in structure. Tricuspid valve regurgitation is not demonstrated.  12. The aortic valve is tricuspid. Aortic valve regurgitation is not visualized. No evidence of aortic valve sclerosis or stenosis.  13. The pulmonic valve was not well visualized. Pulmonic valve regurgitation is not visualized.  14. The inferior vena cava is normal in size with greater than 50% respiratory variability, suggesting right atrial pressure of 3 mmHg  LEFT HEART CATHETERIZATION AND CORONARY ANGIOGRAPHY performed on 09/27/2017 1. Acute anterior STEMI secondary to occlusion of the mid LAD  Proximal LAD is  100% stenosed  Synergy DES 3 x 16 placed with 0% residual stenosis 2. Ostial first diagonal was 100% stenosed  successful PTCA/angioplasty to the ostium of th first diagonal branch using a 2.5 x 12 sapphire balloon  3. mild nonocclusive disease in the RCA, LCx, and mid LAD  30% stenosis of the proximal RCA  20% stenosis of the proximal LCx  40% stenosis of  the proximal LAD to mid LAD  Impression and Plan:  [name] [name] has been referred for pre-anesthesia review and clearance prior to him undergoing the planned anesthetic and procedural courses. Available labs, pertinent testing, and imaging results were personally reviewed by me. This patient has been appropriately cleared by cardiology with an overall ACCEPTABLE risk of significant perioperative cardiovascular complications.  Based on clinical review performed today (06/13/20), barring any significant acute changes in the patient's overall condition, it is anticipated that he will be able to proceed with the planned surgical intervention. Any acute changes in clinical condition may necessitate his procedure being postponed and/or cancelled. Pre-surgical instructions were reviewed with the patient during his PAT appointment and questions were fielded by PAT clinical staff.  Honor Loh, MSN, APRN, FNP-C, CEN [facility]  Peri-operative Services Nurse Practitioner Phone: [phone] 06/13/20 3:32 PM  NOTE: This note has been prepared using Dragon dictation software. Despite my best ability to proofread, there is always the potential that unintentional transcriptional errors may still occur from this process.

## 2020-06-15 MED ORDER — CEFAZOLIN SODIUM-DEXTROSE 2-4 GM/100ML-% IV SOLN
2.0000 g | INTRAVENOUS | Status: AC
  Administered 2020-06-16: 1 g via INTRAVENOUS

## 2020-06-16 ENCOUNTER — Ambulatory Visit: Payer: Medicare Other | Admitting: Urgent Care

## 2020-06-16 ENCOUNTER — Ambulatory Visit
Admission: RE | Admit: 2020-06-16 | Discharge: 2020-06-16 | Disposition: A | Discharge status: Home / Self Care | Payer: Medicare Other | Attending: Urology | Admitting: Urology

## 2020-06-16 ENCOUNTER — Encounter
Admission: RE | Disposition: A | Discharge status: Home / Self Care | Payer: Self-pay | Source: Home / Self Care | Attending: Urology

## 2020-06-16 ENCOUNTER — Ambulatory Visit: Payer: Medicare Other

## 2020-06-16 ENCOUNTER — Encounter: Payer: Self-pay | Admitting: Urology

## 2020-06-16 DIAGNOSIS — Z7982 Long term (current) use of aspirin: Secondary | ICD-10-CM | POA: Insufficient documentation

## 2020-06-16 DIAGNOSIS — D494 Neoplasm of unspecified behavior of bladder: Secondary | ICD-10-CM | POA: Diagnosis present

## 2020-06-16 DIAGNOSIS — R590 Localized enlarged lymph nodes: Secondary | ICD-10-CM | POA: Diagnosis not present

## 2020-06-16 DIAGNOSIS — I5021 Acute systolic (congestive) heart failure: Secondary | ICD-10-CM | POA: Diagnosis not present

## 2020-06-16 DIAGNOSIS — Z8719 Personal history of other diseases of the digestive system: Secondary | ICD-10-CM | POA: Diagnosis not present

## 2020-06-16 DIAGNOSIS — N3289 Other specified disorders of bladder: Secondary | ICD-10-CM | POA: Diagnosis not present

## 2020-06-16 DIAGNOSIS — Z8249 Family history of ischemic heart disease and other diseases of the circulatory system: Secondary | ICD-10-CM | POA: Diagnosis not present

## 2020-06-16 DIAGNOSIS — I252 Old myocardial infarction: Secondary | ICD-10-CM | POA: Diagnosis not present

## 2020-06-16 DIAGNOSIS — Z79899 Other long term (current) drug therapy: Secondary | ICD-10-CM | POA: Insufficient documentation

## 2020-06-16 DIAGNOSIS — I110 Hypertensive heart disease with heart failure: Secondary | ICD-10-CM | POA: Insufficient documentation

## 2020-06-16 DIAGNOSIS — Z955 Presence of coronary angioplasty implant and graft: Secondary | ICD-10-CM | POA: Insufficient documentation

## 2020-06-16 DIAGNOSIS — I480 Paroxysmal atrial fibrillation: Secondary | ICD-10-CM | POA: Insufficient documentation

## 2020-06-16 DIAGNOSIS — E785 Hyperlipidemia, unspecified: Secondary | ICD-10-CM | POA: Insufficient documentation

## 2020-06-16 DIAGNOSIS — C674 Malignant neoplasm of posterior wall of bladder: Secondary | ICD-10-CM | POA: Diagnosis not present

## 2020-06-16 DIAGNOSIS — C679 Malignant neoplasm of bladder, unspecified: Secondary | ICD-10-CM | POA: Insufficient documentation

## 2020-06-16 DIAGNOSIS — J449 Chronic obstructive pulmonary disease, unspecified: Secondary | ICD-10-CM | POA: Diagnosis not present

## 2020-06-16 DIAGNOSIS — Z87891 Personal history of nicotine dependence: Secondary | ICD-10-CM | POA: Insufficient documentation

## 2020-06-16 HISTORY — DX: Anxiety disorder, unspecified: F41.9

## 2020-06-16 HISTORY — DX: Gastro-esophageal reflux disease without esophagitis: K21.9

## 2020-06-16 HISTORY — PX: TRANSURETHRAL RESECTION OF BLADDER TUMOR: SHX2575

## 2020-06-16 HISTORY — PX: CYSTOSCOPY W/ RETROGRADES: SHX1426

## 2020-06-16 LAB — ACID FAST CULTURE WITH REFLEXED SENSITIVITIES (MYCOBACTERIA): Acid Fast Culture: NEGATIVE

## 2020-06-16 SURGERY — TURBT (TRANSURETHRAL RESECTION OF BLADDER TUMOR)
Anesthesia: General

## 2020-06-16 MED ORDER — LACTATED RINGERS IV SOLN: INTRAVENOUS | Status: DC

## 2020-06-16 MED ORDER — DEXAMETHASONE SODIUM PHOSPHATE 10 MG/ML IJ SOLN
INTRAMUSCULAR | Status: AC
  Filled 2020-06-16: qty 1

## 2020-06-16 MED ORDER — FENTANYL CITRATE (PF) 100 MCG/2ML IJ SOLN
INTRAMUSCULAR | Status: AC
  Filled 2020-06-16: qty 2

## 2020-06-16 MED ORDER — ORAL CARE MOUTH RINSE: 15.0000 mL | Freq: Once | OROMUCOSAL | Status: DC

## 2020-06-16 MED ORDER — GLYCOPYRROLATE 0.2 MG/ML IJ SOLN
INTRAMUSCULAR | Status: AC
  Filled 2020-06-16: qty 1

## 2020-06-16 MED ORDER — PROPOFOL 10 MG/ML IV BOLUS
INTRAVENOUS | Status: AC
  Filled 2020-06-16: qty 20

## 2020-06-16 MED ORDER — GLYCOPYRROLATE 0.2 MG/ML IJ SOLN
INTRAMUSCULAR | Status: DC | PRN
  Administered 2020-06-16: .2 mg via INTRAVENOUS

## 2020-06-16 MED ORDER — CEFAZOLIN SODIUM-DEXTROSE 1-4 GM/50ML-% IV SOLN
INTRAVENOUS | Status: DC | PRN
  Administered 2020-06-16: 1 g via INTRAVENOUS

## 2020-06-16 MED ORDER — DEXMEDETOMIDINE (PRECEDEX) IN NS 20 MCG/5ML (4 MCG/ML) IV SYRINGE
PREFILLED_SYRINGE | INTRAVENOUS | Status: DC | PRN
  Administered 2020-06-16: 4 ug via INTRAVENOUS
  Administered 2020-06-16: 8 ug via INTRAVENOUS

## 2020-06-16 MED ORDER — OXYCODONE HCL 5 MG/5ML PO SOLN
5.0000 mg | Freq: Once | ORAL | Status: DC | PRN
Start: 2020-06-16 — End: 2020-06-16

## 2020-06-16 MED ORDER — URIBEL 118 MG PO CAPS: 1.0000 | ORAL_CAPSULE | Freq: Three times a day (TID) | ORAL | 0 refills | Status: DC | PRN

## 2020-06-16 MED ORDER — PHENYLEPHRINE HCL (PRESSORS) 10 MG/ML IV SOLN
INTRAVENOUS | Status: DC | PRN
  Administered 2020-06-16 (×2): 50 ug via INTRAVENOUS

## 2020-06-16 MED ORDER — CHLORHEXIDINE GLUCONATE 0.12 % MT SOLN
OROMUCOSAL | Status: AC
  Filled 2020-06-16: qty 15

## 2020-06-16 MED ORDER — OXYCODONE HCL 5 MG PO TABS: 5.0000 mg | ORAL_TABLET | Freq: Once | ORAL | Status: DC | PRN

## 2020-06-16 MED ORDER — CEFAZOLIN SODIUM 1 G IJ SOLR
INTRAMUSCULAR | Status: AC
  Filled 2020-06-16: qty 10

## 2020-06-16 MED ORDER — DEXAMETHASONE SODIUM PHOSPHATE 10 MG/ML IJ SOLN
INTRAMUSCULAR | Status: DC | PRN
  Administered 2020-06-16: 10 mg via INTRAVENOUS

## 2020-06-16 MED ORDER — FENTANYL CITRATE (PF) 100 MCG/2ML IJ SOLN
INTRAMUSCULAR | Status: DC | PRN
  Administered 2020-06-16: 50 ug via INTRAVENOUS

## 2020-06-16 MED ORDER — CHLORHEXIDINE GLUCONATE 0.12 % MT SOLN: 15.0000 mL | Freq: Once | OROMUCOSAL | Status: DC

## 2020-06-16 MED ORDER — EPHEDRINE SULFATE 50 MG/ML IJ SOLN
INTRAMUSCULAR | Status: DC | PRN
  Administered 2020-06-16 (×2): 10 mg via INTRAVENOUS
  Administered 2020-06-16: 15 mg via INTRAVENOUS

## 2020-06-16 MED ORDER — EPHEDRINE 5 MG/ML INJ
INTRAVENOUS | Status: AC
  Filled 2020-06-16: qty 10

## 2020-06-16 MED ORDER — ONDANSETRON HCL 4 MG/2ML IJ SOLN
INTRAMUSCULAR | Status: DC | PRN
  Administered 2020-06-16: 4 mg via INTRAVENOUS

## 2020-06-16 MED ORDER — GEMCITABINE CHEMO FOR BLADDER INSTILLATION 2000 MG
INTRAVENOUS | Status: DC | PRN
  Administered 2020-06-16: 2000 mg via INTRAVESICAL

## 2020-06-16 MED ORDER — CEFAZOLIN SODIUM-DEXTROSE 1-4 GM/50ML-% IV SOLN
INTRAVENOUS | Status: AC
  Filled 2020-06-16: qty 50

## 2020-06-16 MED ORDER — LIDOCAINE HCL (CARDIAC) PF 100 MG/5ML IV SOSY
PREFILLED_SYRINGE | INTRAVENOUS | Status: DC | PRN
  Administered 2020-06-16: 100 mg via INTRAVENOUS

## 2020-06-16 MED ORDER — FENTANYL CITRATE (PF) 100 MCG/2ML IJ SOLN: 25.0000 ug | INTRAMUSCULAR | Status: DC | PRN

## 2020-06-16 MED ORDER — ONDANSETRON HCL 4 MG/2ML IJ SOLN
INTRAMUSCULAR | Status: AC
  Filled 2020-06-16: qty 2

## 2020-06-16 MED ORDER — DEXMEDETOMIDINE (PRECEDEX) IN NS 20 MCG/5ML (4 MCG/ML) IV SYRINGE
PREFILLED_SYRINGE | INTRAVENOUS | Status: AC
  Filled 2020-06-16: qty 5

## 2020-06-16 MED ORDER — SODIUM CHLORIDE (PF) 0.9 % IJ SOLN
INTRAMUSCULAR | Status: AC
  Filled 2020-06-16: qty 10

## 2020-06-16 MED ORDER — LIDOCAINE HCL (PF) 2 % IJ SOLN
INTRAMUSCULAR | Status: AC
  Filled 2020-06-16: qty 5

## 2020-06-16 MED ORDER — PROPOFOL 10 MG/ML IV BOLUS
INTRAVENOUS | Status: DC | PRN
  Administered 2020-06-16: 150 mg via INTRAVENOUS

## 2020-06-16 MED ORDER — IOPAMIDOL (ISOVUE-200) INJECTION 41%
INTRAVENOUS | Status: DC | PRN
  Administered 2020-06-16: 35 mL

## 2020-06-16 SURGICAL SUPPLY — 32 items
BAG DRAIN CYSTO-URO LG1000N (MISCELLANEOUS) ×3 IMPLANT
BAG DRN RND TRDRP ANRFLXCHMBR (UROLOGICAL SUPPLIES) ×2
BAG URINE DRAIN 2000ML AR STRL (UROLOGICAL SUPPLIES) ×3 IMPLANT
BRUSH SCRUB EZ  4% CHG (MISCELLANEOUS) ×1
BRUSH SCRUB EZ 4% CHG (MISCELLANEOUS) ×2 IMPLANT
CATH FOLEY 2WAY 18X30 (CATHETERS) IMPLANT
CATH FOLEY 2WAY SIL 18X30 (CATHETERS)
CATH URETL 5X70 OPEN END (CATHETERS) ×3 IMPLANT
DRAPE UTILITY 15X26 TOWEL STRL (DRAPES) ×3 IMPLANT
DRSG TELFA 4X3 1S NADH ST (GAUZE/BANDAGES/DRESSINGS) ×3 IMPLANT
ELECT LOOP 22F BIPOLAR SML (ELECTROSURGICAL) ×3
ELECT REM PT RETURN 9FT ADLT (ELECTROSURGICAL)
ELECTRODE LOOP 22F BIPOLAR SML (ELECTROSURGICAL) ×2 IMPLANT
ELECTRODE REM PT RTRN 9FT ADLT (ELECTROSURGICAL) IMPLANT
GLOVE SURG UNDER POLY LF SZ7.5 (GLOVE) ×3 IMPLANT
GOWN STRL REUS W/ TWL LRG LVL3 (GOWN DISPOSABLE) ×2 IMPLANT
GOWN STRL REUS W/ TWL XL LVL3 (GOWN DISPOSABLE) ×2 IMPLANT
GOWN STRL REUS W/TWL LRG LVL3 (GOWN DISPOSABLE) ×3
GOWN STRL REUS W/TWL XL LVL3 (GOWN DISPOSABLE) ×3
GOWN STRL REUS W/TWL XL LVL4 (GOWN DISPOSABLE) ×3 IMPLANT
GUIDEWIRE STR DUAL SENSOR (WIRE) ×3 IMPLANT
IV NS IRRIG 3000ML ARTHROMATIC (IV SOLUTION) ×6 IMPLANT
KIT TURNOVER CYSTO (KITS) ×3 IMPLANT
LOOP CUT BIPOLAR 24F LRG (ELECTROSURGICAL) IMPLANT
MANIFOLD NEPTUNE II (INSTRUMENTS) IMPLANT
PACK CYSTO AR (MISCELLANEOUS) ×3 IMPLANT
SET CYSTO W/LG BORE CLAMP LF (SET/KITS/TRAYS/PACK) ×3 IMPLANT
SET IRRIG Y TYPE TUR BLADDER L (SET/KITS/TRAYS/PACK) ×3 IMPLANT
SURGILUBE 2OZ TUBE FLIPTOP (MISCELLANEOUS) ×3 IMPLANT
SYR TOOMEY IRRIG 70ML (MISCELLANEOUS) ×3
SYRINGE TOOMEY IRRIG 70ML (MISCELLANEOUS) ×2 IMPLANT
WATER STERILE IRR 1000ML POUR (IV SOLUTION) ×3 IMPLANT

## 2020-06-16 NOTE — Anesthesia Preprocedure Evaluation (Signed)
Anesthesia Evaluation  Patient identified by MRN, date of birth, ID band Patient awake    Reviewed: Allergy & Precautions, H&P , NPO status , Patient's Chart, lab work & pertinent test results  History of Anesthesia Complications Negative for: history of anesthetic complications  Airway Mallampati: III  TM Distance: >3 FB Neck ROM: limited    Dental  (+) Chipped, Poor Dentition, Missing, Caps   Pulmonary shortness of breath and with exertion, pneumonia, COPD, former smoker,    Pulmonary exam normal        Cardiovascular Exercise Tolerance: Good hypertension, + Past MI and + Cardiac Stents  Normal cardiovascular exam     Neuro/Psych PSYCHIATRIC DISORDERS  Neuromuscular disease    GI/Hepatic Neg liver ROS, GERD  Medicated and Controlled,  Endo/Other  negative endocrine ROS  Renal/GU Renal disease     Musculoskeletal   Abdominal   Peds  Hematology negative hematology ROS (+)   Anesthesia Other Findings Patient has cardiac clearance for this procedure.   Past Medical History: No date: Acute systolic heart failure (HCC) No date: Anxiety No date: COPD (chronic obstructive pulmonary disease) (H[facility]) No date: Enlarged prostate with lower urinary tract symptoms (LUTS) No date: GERD (gastroesophageal reflux disease) No date: Hyperlipidemia No date: Hypertension No date: Low serum testosterone level No date: PAF (paroxysmal atrial fibrillation) ([place]) 05/2014: Pneumonia No date: Pyothorax without fistula (H[facility]) No date: Sciatica No date: Shortness of breath dyspnea No date: Solitary pulmonary nodule No date: STEMI (ST elevation myocardial infarction) ([place])     Comment:  09/27/17 PCI/DES x1 mLAD, PTCA of the diag branch. EF 30%              Lifevest at discharge.  No date: Testicular hypofunction No date: Urinary frequency  Past Surgical History: No date: APPENDECTOMY No date: BACK SURGERY 2011: COLONOSCOPY      Comment:  Tubular adenoma per patient 03/03/2015: COLONOSCOPY; N/A     Comment:  Dr. Oneida Alar: Left colon is redundant, mild               diverticulosis, small internal hemorrhoids, moderate               external hemorrhoids.  Next colonoscopy in 5 years with               overtube. 03/25/2019: COLONOSCOPY; N/A     Comment:  Procedure: COLONOSCOPY;  Surgeon: Danie Binder, MD;                [facility];  Service: Endoscopy;                Laterality: N/A;  8:30am - Camille's request 09/27/2017: CORONARY/GRAFT ACUTE MI REVASCULARIZATION; N/A     Comment:  Procedure: Coronary/Graft Acute MI Revascularization;                Surgeon: Burnell Blanks, MD;  Location: [place] [facility];  Service: Cardiovascular;  Laterality:               N/A; 06/27/2014: EMPYEMA DRAINAGE; Right     Comment:  Procedure: EMPYEMA DRAINAGE;  Surgeon: Grace Isaac, MD;  Location: [place];  Service: Thoracic;                Laterality: Right; 03/03/2015: HEMORRHOID BANDING; N/A  Comment:  Procedure: HEMORRHOID BANDING;  Surgeon: Danie Binder,              MD;  [facility];  Service: Endoscopy;                Laterality: N/A;  Recomended to see a Psychologist, sport and exercise. No date: HERNIA REPAIR 09/27/2017: INTRAVASCULAR ULTRASOUND/IVUS; N/A     Comment:  Procedure: Intravascular Ultrasound/IVUS;  Surgeon:               Burnell Blanks, MD;  Location: [place] [facility]               LAB;  Service: Cardiovascular;  Laterality: N/A; 09/27/2017: LEFT HEART CATH AND CORONARY ANGIOGRAPHY; N/A     Comment:  Procedure: LEFT HEART CATH AND CORONARY ANGIOGRAPHY;                Surgeon: Burnell Blanks, MD;  Location: [place] [facility];  Service: Cardiovascular;  Laterality:               N/A; 2016: LUNG SURGERY 03/25/2019: POLYPECTOMY     Comment:  Procedure: POLYPECTOMY;  Surgeon: Danie Binder, MD;                [facility];   Service: Endoscopy;; 07/14/2014: TEE WITHOUT CARDIOVERSION; N/A     Comment:  Procedure: TRANSESOPHAGEAL ECHOCARDIOGRAM (TEE);                Surgeon: Sueanne Margarita, MD;  Location: [place];                Service: Cardiovascular;  Laterality: N/A; No date: VARICOSE VEIN SURGERY 06/27/2014: VIDEO ASSISTED THORACOSCOPY; Right     Comment:  Procedure: VIDEO ASSISTED THORACOSCOPY;  Surgeon: Grace Isaac, MD;  Location: [place];  Service: Thoracic;                Laterality: Right; 06/27/2014: VIDEO BRONCHOSCOPY; N/A     Comment:  Procedure: VIDEO BRONCHOSCOPY;  Surgeon: Grace Isaac, MD;  [facility];  Service: Thoracic;                Laterality: N/A;  BMI    Body Mass Index: 23.06 kg/m      Reproductive/Obstetrics negative OB ROS                             Anesthesia Physical Anesthesia Plan  ASA: III  Anesthesia Plan: General LMA   Post-op Pain Management:    Induction: Intravenous  PONV Risk Score and Plan: Dexamethasone, Ondansetron, Midazolam and Treatment may vary due to age or medical condition  Airway Management Planned: LMA  Additional Equipment:   Intra-op Plan:   Post-operative Plan: Extubation in OR  Informed Consent: I have reviewed the patients History and Physical, chart, labs and discussed the procedure including the risks, benefits and alternatives for the proposed anesthesia with the patient or authorized representative who has indicated his/her understanding and acceptance.     Dental Advisory Given  Plan Discussed with: Anesthesiologist, CRNA and Surgeon  Anesthesia Plan Comments: (Patient consented for risks of anesthesia including but not limited to:  - adverse  reactions to medications - damage to eyes, teeth, lips or other oral mucosa - nerve damage due to positioning  - sore throat or hoarseness - Damage to heart, brain, nerves, lungs, other parts of body or loss of  life  Patient voiced understanding.)        Anesthesia Quick Evaluation

## 2020-06-16 NOTE — Progress Notes (Signed)
Patient stood to get dressed and had blood dripping from the tip of his penis. Called into the or to ask the physician about the bleeding and if it was ok for him to go home. He had to dilate patient for the procedure. Instructed to wrap penis for 5 minutes and the bleeding should subside.

## 2020-06-16 NOTE — Op Note (Signed)
Preoperative diagnosis: Bladder tumor (< 2 cm)  Postoperative diagnosis:  Same  Procedure:  Cystoscopy Transurethral resection of bladder tumor (< 2 cm) Bilateral retrograde pyelography with interpretation Instillation intravesical gemcitabine  Surgeon: Nicki Reaper C. Katheryn Culliton, M.D.  Anesthesia: General  Complications: None  Intraoperative findings:  Cystoscopy-urethra normal in caliber w/o stricture, moderate lateral lobe enlargement with elevated bladder neck; mild bladder trabeculation; ureteral orifices normal-appearing with clear efflux bilaterally.  ~ 1.5 cm papillary tumor right inferoposterior wall.  No other mucosal abnormalities identified Retrograde pyelography:  Left retrograde pyelogram-normal caliber ureter without dilation, filling defect; renal pelvis normal in appearance without hydronephrosis or filling defects; infundibula and calyces fine and delicate without abnormalities Right retrograde pyelogram-normal caliber ureter without dilation or filling defect; renal pelvis normal in appearance without hydronephrosis/filling defect; infundibula and calyces fine and delicate without abnormalities  EBL: Minimal  Specimens: Bladder tumor Base of tumor   Indication: [name] [name] is a 75 y.o. male incidentally noted to have a 1.3 cm bladder mass on CT which was performed for evaluation of abdominal pain and weight loss.  He elected to proceed with cystoscopy under anesthesia and possible TURBT.  After reviewing the management options for treatment, he elected to proceed with the above surgical procedure(s). We have discussed the potential benefits and risks of the procedure, side effects of the proposed treatment, the likelihood of the patient achieving the goals of the procedure, and any potential problems that might occur during the procedure or recuperation. Informed consent has been obtained.  Description of procedure:  The patient was taken to the operating room and  general anesthesia was induced.  The patient was placed in the dorsal lithotomy position, prepped and draped in the usual sterile fashion, and preoperative antibiotics were administered. A preoperative time-out was performed.   A 21 French cystoscope was lubricated, passed per urethra and advanced proximally under direct vision with findings as described above.  A 5 French open-ended ureteral catheter was placed through the cystoscope and into the right ureteral orifice.  Contrast was injected through the catheter with findings as described above.  The catheter was then placed into the left ureteral orifice with injection of contrast and findings as described above.  The cystoscope was removed and a 24 [place] continuous-flow resectoscope sheath was unable to be advanced per urethra due to narrowing of the meatus.  The distal urethra was dilated with [name] [name] sounds from 20-26 [place] and the resectoscope sheath was then passed without problems.  The Iglesias resectoscope was then placed into the sheath.  Repeat cystoscopy was performed.  The posterior wall tumor was then resected with bipolar cautery and removed the irrigation.  Cold biopsies of the tumor base were then performed.  Hemostasis was obtained with bipolar cautery.  The resectoscope was removed and a 51 French Foley catheter was placed with return of clear effluent upon irrigation.   After anesthetic reversal the patient was transported to the PACU in stable condition.  2000 mg of intravesical gemcitabine was instilled to the bladder.  This was allowed to dwell in the PACU for 1 hour. This was well-tolerated.  After 1 hour, the catheter was drained and removed.  Plan: Mr. Quirino will be contacted with the pathology results and further follow-up recommendations.   [name] [name] C. Bernardo Heater,  MD

## 2020-06-16 NOTE — Anesthesia Procedure Notes (Signed)
Procedure Name: LMA Insertion Date/Time: 06/16/2020 9:52 AM Performed by: Jonna Clark, CRNA Pre-anesthesia Checklist: Patient identified, Patient being monitored, Timeout performed, Emergency Drugs available and Suction available Patient Re-evaluated:Patient Re-evaluated prior to induction Oxygen Delivery Method: Circle system utilized Preoxygenation: Pre-oxygenation with 100% oxygen Induction Type: IV induction Ventilation: Mask ventilation without difficulty LMA: LMA inserted LMA Size: 4.0 Tube type: Oral Number of attempts: 1 Placement Confirmation: positive ETCO2 and breath sounds checked- equal and bilateral Tube secured with: Tape Dental Injury: Teeth and Oropharynx as per pre-operative assessment

## 2020-06-16 NOTE — Discharge Instructions (Signed)
Transurethral Resection of Bladder Tumor (TURBT) or Bladder Biopsy   Definition:  Transurethral Resection of the Bladder Tumor is a surgical procedure used to diagnose and remove tumors within the bladder.   General instructions:     Your recent bladder surgery requires very little post hospital care but some definite precautions.  Despite the fact that no skin incisions were used, the area around the bladder incisions are raw and covered with scabs to promote healing and prevent bleeding. Certain precautions are needed to insure that the scabs are not disturbed over the next 2-4 weeks while the healing proceeds.  Because the raw surface inside your bladder and the irritating effects of urine you may expect frequency of urination and/or urgency (a stronger desire to urinate) and perhaps even getting up at night more often. This will usually resolve or improve slowly over the healing period. You may see some blood in your urine over the first 6 weeks. Do not be alarmed, even if the urine was clear for a while. Get off your feet and drink lots of fluids until clearing occurs. If you start to pass clots or don't improve call us.  Diet:  You may return to your normal diet immediately. Because of the raw surface of your bladder, alcohol, spicy foods, foods high in acid and drinks with caffeine may cause irritation or frequency and should be used in moderation. To keep your urine flowing freely and avoid constipation, drink plenty of fluids during the day (8-10 glasses). Tip: Avoid cranberry juice because it is very acidic.  Activity:  Recommend no lifting >10 pounds or strenuous activity x14 days.  You may see intermittent blood in your urine and recommend that you reduce your activity under the circumstances until the bleeding has stopped.  Bowels:  It is important to keep your bowels regular during the postoperative period. Straining with bowel movements can cause bleeding. A bowel movement  every other day is reasonable. Use a mild laxative if needed, such as milk of magnesia 2-3 tablespoons, or 2 Dulcolax tablets. Call if you continue to have problems. If you had been taking narcotics for pain, before, during or after your surgery, you may be constipated. Take a laxative if necessary.    Medication:  You should resume your pre-surgery medications unless told not to. In addition you may be given an antibiotic to prevent or treat infection. Antibiotics are not always necessary. All medication should be taken as prescribed until the bottles are finished unless you are having an unusual reaction to one of the drugs.  A medication for frequency, urgency and burning with urination was sent to your pharmacy  Follow-up: You will be contacted with your pathology results and further follow-up will be scheduled at that time   [place], [place],  [phone]  [place]   1) The drugs that you were given will stay in your system until tomorrow so for the next 24 hours you should not:  A) Drive an automobile B) Make any legal decisions C) Drink any alcoholic beverage   2) You may resume regular meals tomorrow.  Today it is better to start with liquids and gradually work up to solid foods.  You may eat anything you prefer, but it is better to start with liquids, then soup and crackers, and gradually work up to solid foods.   3) Please notify your doctor immediately if you have any unusual bleeding, trouble  breathing, redness and pain at the surgery site, drainage, fever, or pain not relieved by medication.    4) Additional Instructions:    Please contact your physician with any problems or Same Day Surgery at [phone], Monday through Friday 6 am to 4 pm, or [place] [facility] number at [phone].

## 2020-06-16 NOTE — Interval H&P Note (Signed)
History and Physical Interval Note:  06/16/2020 9:38 AM  [name] [name]  has presented today for surgery, with the diagnosis of bLADDER mASS.  The various methods of treatment have been discussed with the patient and family. After consideration of risks, benefits and other options for treatment, the patient has consented to  Procedure(s): TRANSURETHRAL RESECTION OF BLADDER TUMOR (TURBT) WITH GEMCITABINE (N/A) CYSTOSCOPY WITH RETROGRADE PYELOGRAM (Bilateral) as a surgical intervention.  The patient's history has been reviewed, patient examined, no change in status, stable for surgery.  I have reviewed the patient's chart and labs.  Questions were answered to the patient's satisfaction.     [place]

## 2020-06-16 NOTE — Transfer of Care (Signed)
Immediate Anesthesia Transfer of Care Note  Patient: [name] [name]  Procedure(s) Performed: TRANSURETHRAL RESECTION OF BLADDER TUMOR (TURBT) WITH GEMCITABINE (N/A ) CYSTOSCOPY WITH RETROGRADE PYELOGRAM (Bilateral )  Patient Location: PACU  Anesthesia Type:General  Level of Consciousness: drowsy and patient cooperative  Airway & Oxygen Therapy: Patient Spontanous Breathing and Patient connected to face mask oxygen  Post-op Assessment: Report given to RN and Post -op Vital signs reviewed and stable  Post vital signs: Reviewed and stable  Last Vitals:  Vitals Value Taken Time  BP 107/61 06/16/20 1053  Temp    Pulse 60 06/16/20 1053  Resp 16 06/16/20 1053  SpO2 100 % 06/16/20 1053  Vitals shown include unvalidated device data.  Last Pain:  Vitals:   06/16/20 0813  PainSc: 0-No pain         Complications: No complications documented.

## 2020-06-17 ENCOUNTER — Encounter: Payer: Self-pay | Admitting: Urology

## 2020-06-17 ENCOUNTER — Telehealth: Payer: Self-pay | Admitting: Urology

## 2020-06-17 LAB — SURGICAL PATHOLOGY

## 2020-06-17 NOTE — Anesthesia Postprocedure Evaluation (Signed)
Anesthesia Post Note  Patient: [name] [name]  Procedure(s) Performed: TRANSURETHRAL RESECTION OF BLADDER TUMOR (TURBT) WITH GEMCITABINE (N/A ) CYSTOSCOPY WITH RETROGRADE PYELOGRAM (Bilateral )  Patient location during evaluation: PACU Anesthesia Type: General Level of consciousness: awake and alert Pain management: pain level controlled Vital Signs Assessment: post-procedure vital signs reviewed and stable Respiratory status: spontaneous breathing, nonlabored ventilation, respiratory function stable and patient connected to nasal cannula oxygen Cardiovascular status: blood pressure returned to baseline and stable Postop Assessment: no apparent nausea or vomiting Anesthetic complications: no   No complications documented.   Last Vitals:  Vitals:   06/16/20 1201 06/16/20 1226  BP: 109/66 117/68  Pulse: 63 60  Resp: 15 15  Temp: (!) 36.1 C   SpO2: 94% 98%    Last Pain:  Vitals:   06/17/20 1352  TempSrc:   PainSc: 2                  Precious Haws Manal Kreutzer

## 2020-06-17 NOTE — Telephone Encounter (Signed)
I contacted [name] [name] regarding his bladder pathology report. The resected tumor was a low-grade urothelial carcinoma the bladder.  No muscle invasion present.  The pathology report was reviewed in detail.  He did receive post resection intravesical gemcitabine and was informed that no other treatment is needed for this tumor other than periodic surveillance.  I recommended a office surveillance cystoscopy in 3-4 months.  [place] and will schedule for early August.  He was instructed to call earlier for any problems.

## 2020-06-23 ENCOUNTER — Ambulatory Visit (INDEPENDENT_AMBULATORY_CARE_PROVIDER_SITE_OTHER): Payer: Medicare Other | Admitting: Internal Medicine

## 2020-06-23 ENCOUNTER — Other Ambulatory Visit: Payer: Self-pay

## 2020-06-23 ENCOUNTER — Encounter: Payer: Self-pay | Admitting: Internal Medicine

## 2020-06-23 DIAGNOSIS — J479 Bronchiectasis, uncomplicated: Secondary | ICD-10-CM

## 2020-06-23 DIAGNOSIS — J449 Chronic obstructive pulmonary disease, unspecified: Secondary | ICD-10-CM | POA: Diagnosis not present

## 2020-06-23 DIAGNOSIS — R053 Chronic cough: Secondary | ICD-10-CM

## 2020-06-23 NOTE — Patient Instructions (Addendum)
After 3 months try off prilosec and replace it and replace  pepcid taken after bfast and continue after supper x 2 weeks then after that drop off the am pepcid and 2 weeks after than stop the pm pepcid > if tightness flares then see GI doctor   Make sure the flutter valve flutters on expiration as much you want   For cough / congestion the maximum dose of mucinex or mucinex dm is 2400 mg in 24 hours    Please schedule a follow up visit in 6  months but call sooner if needed

## 2020-06-23 NOTE — Assessment & Plan Note (Signed)
See HRCT  02/18/20 Diffuse bronchial wall thickening with mild to moderate centrilobular and paraseptal emphysema. High-resolution images also demonstrate widespread areas of cylindrical bronchiectasis with considerable mucous plugging with assoc nodules typical of MAI  - sputum 03/30/20   Nl flora  - sputum afb 03/30/20  >>> neg smear, neg culture  - Quant Igs 04/27/2020    nl  - Alpha one phenotype  04/27/2020   MM level 141  - Flutter valve rec  05/15/20 > reviewed 06/23/2020   - The proper method of use, as well as anticipated side effects, of a flutter valve  were discussed and demonstrated to the patient using teach back method.

## 2020-06-23 NOTE — Assessment & Plan Note (Signed)
MM/Quit all smoking 2016 with dx of R empyema with chronic cough since then - Try off spiriva 05/21/2019  - PFT's  08/06/19  FEV1 2.60 (75 % ) ratio 0.64  p 14 % improvement from saba p 0 prior to study with DLCO  18.09 (66%) corrects to 3.14 (79%)  for alv volume and FV curve minimally concave  -  04/27/2020   Walked RA  approx   600 ft  @ fast pace  stopped due to  Sob with sats 99%    -  04/27/2020     alpha one AT phenotype  MM level 141  - 05/15/2020  After extensive coaching inhaler device,  effectiveness =    75% from a baseline of well < 50%  So ok to rechallenge with prn saba  Adequate control on present rx, reviewed in detail with pt > no change in rx needed

## 2020-06-23 NOTE — Assessment & Plan Note (Signed)
Onset 2016 assoc with empyema on R  - swallowing eval 11/2018  Oropharyngeal dysphagia s asp/ no restrictions  - off lisinopril  08/13/19 - Allergy profile 12/13/19  >  Eos 0.3 /  IgE  58 - 12/13/2019 empirically try gerd rx x 6 weeks > no better then stopped  -   Sinus CT  02/18/20  Congested appearance of nasal mucosa  but  Negative for sinusitis - 05/15/2020 added back gerd rx x 6 weeks due to evening cough ? Related to meals> improved as did chest tightness as of 06/23/2020   rec 3 m rx for gerd then taper off and f/u GI prn flare  F/u here q 6 m         Each maintenance medication was reviewed in detail including emphasizing most importantly the difference between maintenance and prns and under what circumstances the prns are to be triggered using an action plan format where appropriate.  Total time for H and P, chart review, counseling, reviewing flutter device(s) and generating customized AVS unique to this office visit / same day charting = 25 min

## 2020-06-23 NOTE — Progress Notes (Signed)
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 Pulmonary Consult    Referred by Dr. [name] for eval of COPD. Former Dr [name] pt. He is maintained on spiriva and does not use a rescue inhaler. He states his breathing is doing well currently.    Dyspnea:  3 x weekly treadmill x 50 min at 3.6 -4 and 4.5  Cough: comes and goes x years variably productive not noct  Sleep: does fine in recliner / can do flat in bed  SABA use: none  rec Try off spiriva to see if you notice any difference in your exercise tolerance or cough For cough > mucinex dm up to 1200 mg every 12 hours as needed  Please schedule a follow up visit in 2 months at [place] office with PFT's first if possible   08/06/19  NP dx GOLD II barely   rec Activity as tolerated.  Would discuss with Cardiology that Lisinopril may be aggravating your cough .  Trial of Stiolto 2 puffs daily >  Helped some, too expensive > bevespi  May use Albuterol Inhaler 1-2 puffs every 6hrs as needed.    12/13/2019  f/u ov/[name] [name] re:  GOLD II barely / maint on bevespi and off lisinopril  08/13/19 Chief Complaint  Patient presents with  . Follow-up    productive cough with thick, yellow phlegm  Dyspnea:  Treadmill x up to 55 min 4pm x 4%  Cough: ever since empyema surgery but changed early September 2021 better p zpak Sleeping: in recliner due to back / minimal cough  SABA use: none  02: none  rec Breztri Take 2 puffs first thing in am and then another 2 puffs about 12 hours later x 2 weeks and stop and then restart bevespi only if notice deterioration Pantoprazole  (protonix) 40 mg   Take  30-60 min before first meal of the day and Pepcid (famotidine)  20 mg one after  Supper until return to office  GERD diet  Please remember to go to the lab and x-ray department at [facility]   for your tests - we will call you with the results when they are available. Please schedule a follow up office visit in 6 weeks, call sooner if needed    01/21/2020  f/u ov/[facility] Maher re: COPD GOLD II /  cough x 2016 when dx with R empyema  Chief Complaint  Patient presents with  . Follow-up    productive cough with pinkish color phlegm  Dyspnea:  No change ex tol treadmill on or off breztri vs bevespi vs nothin  Cough: actually better while on treadmill/ still pink tinged variably on baby asa daily  Sleeping: worse on L side down  SABA use: none  02: none  rec For changed mucus from your normal go ahead and use zpak  If mucus gets really bloody, stop the baby aspirin until better  [facility] to stop the reflux meds  - you may need to take pepcid 20 mg twice daily x one week then one daily x week  and stop   schedule your CT chest and sinus and the results > bronchiectasis/ no sinusitis      04/27/2020  f/u ov/[place] office/[name] [name] re: GOLD II/ bronchiectasis  Chief Complaint  Patient presents with  . Follow-up    Shortness of breath with activity, productive cough with yellow phlegm  Dyspnea:  Hardly ever doing the treadmill / uphill to mailbox and back 7-8 min sob/tired Cough: less productive,  More irritative Sleeping: L side down makes same as usual / 10 degrees bed blocks  SABA use: has not tried albuterol  02: none  Covid status: triple vax  Lung cancer screening: just had hrct  rec Ok to try  albuterol (or bztri) 15 min before an activity  Bronchiectasis =   you have scarring of your bronchial tubes Make sure you check your oxygen saturation at your highest level of activity to be sure it stays over 90%     05/15/2020  f/u ov/[name] [name] office/[name] [name] re: copd II/  bronchiectasis Chief Complaint  Patient presents with  . Follow-up    "its been more difficult to cough" Productive cough with yellow phlegm   rx mucinex dm in minimal doses  Dyspnea:  mb and back and 10 min about the same before or after saba Cough: worse in evening not sure before or after supper   Sleeping: better on bed blocks / worse on L side, sleeps p meds but then once stands up  In am   starts coughing x  1 hour SABA use: rarely use now, not sure it helps  02: none  Covid status: vax x 3  rec For cough/congestion > mucinex dm  Up to 1200 mg every 12 hours as needed and add flutter  For nasty mucus > zpak  Try prilosec otc 20mg   Take 30-60 min before first meal of the day and Pepcid ac (famotidine) 20 mg one after  Until return  Work on inhaler technique:  Only use your albuterol as a rescue medication   Ok to continue to Try albuterol 15 min before an activity that you know would make you short of breath       06/23/2020  f/u ov/[place] office/[name] [name] re: copd II/ bronchiectasis on flutter /mucinex/ gerd rx  Chief Complaint  Patient presents with  . Follow-up    Breathing is overall doing well and his cough has improved some. He still has some prod cough with yellow sputum. He has not needed his albuterol inhaler.  Dyspnea:  tol more activity  Cough: slt yellow worse in am = baseline  Sleeping: bed blocks  SABA use: none 02:none  Covid status: vax x 3  - 4th on the way  Chest tightness better p gerd rx    No obvious day to day or daytime variability or assoc excess/ purulent sputum or mucus plugs or hemoptysis or cp or chest tightness, subjective wheeze or overt sinus or hb symptoms.   Sleeping  without nocturnal  or early am exacerbation  of respiratory  c/o's or need for noct saba. Also denies any obvious fluctuation of symptoms with weather or environmental changes or other aggravating or alleviating factors except as outlined above   No unusual exposure hx or h/o  childhood pna/ asthma or knowledge of premature birth.  Current Allergies, Complete Past Medical History, Past Surgical History, Family History, and Social History were reviewed in [employer] record.  ROS  The following are not active complaints unless bolded Hoarseness, sore throat,  dysphagia, dental problems, itching, sneezing,  nasal congestion or discharge of excess mucus or purulent secretions, ear ache,   fever, chills, sweats, unintended wt loss or wt gain, classically pleuritic or exertional cp,  orthopnea pnd or arm/hand swelling  or leg swelling, presyncope, palpitations, abdominal pain, anorexia, nausea, vomiting, diarrhea  or change in bowel habits or change in bladder habits, change in stools or change in urine, dysuria, hematuria,  rash, arthralgias, visual complaints, headache, numbness, weakness or ataxia or problems with walking or coordination,  change in mood or  memory.        Current Meds  Medication Sig  . acetaminophen (TYLENOL) 500 MG tablet Take 500 mg by mouth every 6 (six) hours as needed for moderate pain.  Marland Kitchen albuterol (VENTOLIN HFA) 108 (90 Base) MCG/ACT inhaler Inhale 1-2 puffs into the lungs every 6 (six) hours as needed for wheezing or shortness of breath.  . ALPRAZolam (XANAX) 1 MG tablet Take 1 mg by mouth at bedtime.  Marland Kitchen aspirin EC 81 MG tablet Take 1 tablet (81 mg total) by mouth daily.  . Calcium Citrate-Vitamin D (CALCIUM CITRATE + D3 PO) Take 1 tablet by mouth daily.  . Carboxymethylcellul-Glycerin (LUBRICATING EYE DROPS OP) Place 1 drop into both eyes 2 (two) times daily.  . carvedilol (COREG) 6.25 MG tablet Take 1 tablet (6.25 mg total) by mouth 2 (two) times daily with a meal.  . famotidine (PEPCID) 20 MG tablet Take 20 mg by mouth at bedtime.  Marland Kitchen linaclotide (LINZESS) 145 MCG CAPS capsule Take 1 capsule (145 mcg total) by mouth daily before breakfast. (Patient taking differently: Take 145 mcg by mouth every other day.)  . losartan  (COZAAR) 25 MG tablet TAKE 1/2 TABLET(12.5 MG) BY MOUTH DAILY (Patient taking differently: Take 12.5 mg by mouth daily.)  . melatonin 3 MG TABS tablet Take 4.5 mg by mouth at bedtime.  . Meth-Hyo-M Bl-Na Phos-Ph Sal (URIBEL) 118 MG CAPS Take 1 capsule (118 mg total) by mouth 3 (three) times daily as needed (Urinary frequency, urgency, burning).  . Multiple Vitamins-Minerals (SENIOR VITES PO) Take 1 tablet by mouth daily.  Marland Kitchen neomycin-bacitracin-polymyxin (NEOSPORIN) ointment Apply 1 application topically as needed for wound care.  . nitroGLYCERIN (NITROSTAT) 0.4 MG SL tablet Place 1 tablet (0.4 mg total) under the tongue every 5 (five) minutes as needed. (Patient taking differently: Place 0.4 mg under the tongue every 5 (five) minutes as needed for chest pain.)  . NON FORMULARY Fluc2% toe nail drops--apply topically once daily for toenail fungus  . omeprazole (PRILOSEC) 20 MG capsule Take 20 mg by mouth daily.  . rosuvastatin (CRESTOR) 40 MG tablet Take 1 tablet (40 mg total) by mouth daily.  . sertraline (ZOLOFT) 50 MG tablet Take 50 mg by mouth at bedtime.  . tamsulosin (FLOMAX) 0.4 MG CAPS capsule Take 1 capsule (0.4 mg total) by mouth daily.  . Vitamins A & D (VITAMIN A & D) ointment Apply 1 application topically as needed for dry skin.             Past Medical History:  Diagnosis Date  . Acute systolic heart failure ([place])   . COPD (chronic obstructive pulmonary disease) ([place])   . Enlarged prostate with lower urinary tract symptoms (LUTS)   . Hyperlipidemia   . Hypertension   . Low serum testosterone level   . PAF (paroxysmal atrial fibrillation) ([place])   . Pneumonia 05/2014  . Pyothorax without fistula ([place])   . Sciatica   . Shortness  of breath dyspnea   . Solitary pulmonary nodule   . STEMI (ST elevation myocardial infarction) ([place])    09/27/17 PCI/DES x1 mLAD, PTCA of the diag branch. EF 30% Lifevest at discharge.   . Testicular hypofunction   . Urinary frequency        Objective:    06/23/2020          173   05/15/2020        174 04/27/2020          176  01/21/2020        192   12/13/19 192 lb 3.2 oz (87.2 kg)  08/06/19 198 lb (89.8 kg)  06/24/19 198 lb (89.8 kg)     Vital signs reviewed  06/23/2020  - Note at rest 02 sats  98% on RA   General appearance:    amb pleasant thin wm nad       HEENT : pt wearing mask not removed for exam due to covid - 19 concerns.   NECK :  without JVD/Nodes/TM/ nl carotid upstrokes bilaterally   LUNGS: no acc muscle use,  Min barrel  contour chest wall with bilateral  slightly decreased bs esp R Base s audible wheeze and  without cough on insp or exp maneuvers and min  Hyperresonant  to  percussion bilaterally     CV:  RRR  no s3 or murmur or increase in P2, and no edema   ABD:  soft and nontender with pos end  insp Hoover's  in the supine position. No bruits or organomegaly appreciated, bowel sounds nl  MS:   Nl gait/  ext warm without deformities, calf tenderness, cyanosis or clubbing No obvious joint restrictions   SKIN: warm and dry without lesions    NEURO:  alert, approp, nl sensorium with  no motor or cerebellar deficits apparent.         I personally reviewed images and agree with radiology impression as follows:   Chest CT cuts on abd ct 43/7/22 Extensive mucoid impaction within bronchi of the right middle and right lower lobes with widespread areas of peribronchovascular micro nodularity which are most compatible with distal areas of mucoid impaction.         Assessment

## 2020-06-25 DIAGNOSIS — Z23 Encounter for immunization: Secondary | ICD-10-CM | POA: Diagnosis not present

## 2020-06-26 NOTE — Telephone Encounter (Signed)
Patient called stating that he received a notification that he had a mychart message but cannot see the message.  He was read the response by Dr. Bernardo Heater  What you describe sounds like the normal healing process.  I am not in the office Monday or Tuesday next week but would be happy to make an appointment for you to see one of our PAs to check the area before you leave town.  Patient verbalized understanding and states he will see how the weekend goes. If he does not feel better by Monday he will call and make an appt with a PA for further evaluation

## 2020-06-28 NOTE — Telephone Encounter (Signed)
MW please advise:  My question is on the first sentence of the instructions in the after visit summary.  Does the three months of prilosec include the 6 weeks I have been on it, or did you intend for me to continue for 3 more months starting on the day of my visit (4/5)?  Thanks.

## 2020-06-29 DIAGNOSIS — D225 Melanocytic nevi of trunk: Secondary | ICD-10-CM | POA: Diagnosis not present

## 2020-06-29 DIAGNOSIS — X32XXXD Exposure to sunlight, subsequent encounter: Secondary | ICD-10-CM | POA: Diagnosis not present

## 2020-06-29 DIAGNOSIS — B078 Other viral warts: Secondary | ICD-10-CM | POA: Diagnosis not present

## 2020-06-29 DIAGNOSIS — L570 Actinic keratosis: Secondary | ICD-10-CM | POA: Diagnosis not present

## 2020-06-29 DIAGNOSIS — F4323 Adjustment disorder with mixed anxiety and depressed mood: Secondary | ICD-10-CM | POA: Diagnosis not present

## 2020-06-29 DIAGNOSIS — Z1283 Encounter for screening for malignant neoplasm of skin: Secondary | ICD-10-CM | POA: Diagnosis not present

## 2020-07-01 ENCOUNTER — Ambulatory Visit: Payer: Medicare Other | Admitting: Internal Medicine

## 2020-07-13 NOTE — Telephone Encounter (Signed)
We received this mychart message from patient:  We previously agreed that I need to keep a Zpac at home in case something changes.    My previous Rx, from another doctor, has expired.   I don't think the drug store can request a renewal since the doctor has changed (to you;  used to be [name]).   Please submit another prescription for a Zpac to the [facility] on [address] in [place], preferably with a few refills.   I used my next to last one in January of this year.             Thanks.  Dr Melvyn Novas please advise.

## 2020-07-14 ENCOUNTER — Other Ambulatory Visit: Payer: Self-pay | Admitting: Internal Medicine

## 2020-07-14 MED ORDER — AZITHROMYCIN 250 MG PO TABS: ORAL_TABLET | ORAL | 5 refills | Status: DC

## 2020-07-14 MED ORDER — AZITHROMYCIN 250 MG PO TABS: ORAL_TABLET | ORAL | 0 refills | Status: DC

## 2020-07-14 NOTE — Progress Notes (Signed)
See message 07/14/2020

## 2020-07-15 DIAGNOSIS — H6123 Impacted cerumen, bilateral: Secondary | ICD-10-CM | POA: Diagnosis not present

## 2020-07-15 DIAGNOSIS — H903 Sensorineural hearing loss, bilateral: Secondary | ICD-10-CM | POA: Diagnosis not present

## 2020-07-15 DIAGNOSIS — H838X3 Other specified diseases of inner ear, bilateral: Secondary | ICD-10-CM | POA: Diagnosis not present

## 2020-07-15 DIAGNOSIS — H6981 Other specified disorders of Eustachian tube, right ear: Secondary | ICD-10-CM | POA: Diagnosis not present

## 2020-07-17 ENCOUNTER — Ambulatory Visit: Payer: Medicare Other | Admitting: Urology

## 2020-07-21 ENCOUNTER — Ambulatory Visit: Payer: Medicare Other | Admitting: Urology

## 2020-07-21 ENCOUNTER — Other Ambulatory Visit: Payer: Self-pay | Admitting: Cardiology

## 2020-07-21 DIAGNOSIS — R0602 Shortness of breath: Secondary | ICD-10-CM | POA: Diagnosis not present

## 2020-07-21 DIAGNOSIS — R634 Abnormal weight loss: Secondary | ICD-10-CM | POA: Diagnosis not present

## 2020-07-21 DIAGNOSIS — F332 Major depressive disorder, recurrent severe without psychotic features: Secondary | ICD-10-CM | POA: Diagnosis not present

## 2020-07-21 DIAGNOSIS — F5105 Insomnia due to other mental disorder: Secondary | ICD-10-CM | POA: Diagnosis not present

## 2020-07-21 DIAGNOSIS — N3289 Other specified disorders of bladder: Secondary | ICD-10-CM | POA: Diagnosis not present

## 2020-07-21 DIAGNOSIS — F064 Anxiety disorder due to known physiological condition: Secondary | ICD-10-CM | POA: Diagnosis not present

## 2020-07-21 DIAGNOSIS — K5900 Constipation, unspecified: Secondary | ICD-10-CM | POA: Diagnosis not present

## 2020-07-21 DIAGNOSIS — R195 Other fecal abnormalities: Secondary | ICD-10-CM | POA: Diagnosis not present

## 2020-07-27 DIAGNOSIS — F4323 Adjustment disorder with mixed anxiety and depressed mood: Secondary | ICD-10-CM | POA: Diagnosis not present

## 2020-08-04 ENCOUNTER — Encounter: Payer: Self-pay | Admitting: Gastroenterology

## 2020-08-04 ENCOUNTER — Other Ambulatory Visit: Payer: Self-pay

## 2020-08-04 ENCOUNTER — Ambulatory Visit (INDEPENDENT_AMBULATORY_CARE_PROVIDER_SITE_OTHER): Payer: Medicare Other | Admitting: Gastroenterology

## 2020-08-04 VITALS — BP 104/62 | HR 70 | Temp 97.3°F | Ht 72.0 in | Wt 175.4 lb

## 2020-08-04 DIAGNOSIS — I2510 Atherosclerotic heart disease of native coronary artery without angina pectoris: Secondary | ICD-10-CM | POA: Diagnosis not present

## 2020-08-04 DIAGNOSIS — R590 Localized enlarged lymph nodes: Secondary | ICD-10-CM

## 2020-08-04 DIAGNOSIS — K5900 Constipation, unspecified: Secondary | ICD-10-CM

## 2020-08-04 NOTE — Progress Notes (Signed)
Primary Care Physician: Celene Squibb, MD  Primary Gastroenterologist:  Elon Alas. Abbey Chatters, DO   Chief Complaint  Patient presents with  . Constipation    Doing better. Stopped Linzess    HPI: [name] [name] is a 75 y.o. male here for follow-up.  Last seen back in March.  History of constipation.  Diagnosed with low-grade urothelial carcinoma with squamous differentiation,non-invasive in March 2022.  History of CT abdomen pelvis which showed a bladder lesion concerning for malignancy, several enlarged inguinal lymph nodes as well as widespread areas of mucoid impaction in the right lung.  As far as constipation, Linzess 72 mcg daily not seem to be enough but 145 mcg daily cause diarrhea.  For bloating he was advised to follow the low FODMAP diet.  EGD was offered but patient wanted to hold off.  Plans for repeat imaging in 6 months to evaluate lymph nodes unless deemed to be part of urological process.  Today: overall doing better. Sometimes a little extra effort required to get BM started but then it flows. no longer on Linzess. He is sleeping a lot better now. Bloating improved. Appetite is back. Has not added back any weight he lost. Energy level still down. Pulmonologist thinks he has silent reflux. He is on pepcid at bedtime and omeprazole in am. Pulmonologist plans to wean him off PPI first, and then H2 blocker with plans of monitoring for worsening cough. Has h/o bronchiectasis/empysema as well.   Urologist does not think right inguinal lymph nodes related to his bladder cancer given low grade cancer.   CT abdomen pelvis with contrast May 25, 2020: IMPRESSION: 1. Small lesion measuring 1.3 cm in the urinary bladder concerning for potential urothelial neoplasm. Referral to Urology for further clinical evaluation is recommended in the near future. 2. Several prominent borderline enlarged and mildly enlarged right inguinal lymph nodes are noted. These are nonspecific but  warrant close attention on follow-up studies. These appear slightly more prominent than prior examination from September 27, 2017. 3. Widespread areas of mucoid impaction in the inferior aspect of the right lung. 4. Colonic diverticulosis without evidence of acute diverticulitis at this time. 5. Additional incidental findings, as above.  Completed colonoscopy in January 2021 for positive Cologuard. Eight 2 to 6 mm polyps in the sigmoid colon, at the splenic flexure, in the transverse colon, at the hepatic flexure and in the ascending colon, removed with a cold snare. Diverticulosis in the recto-sigmoid colon, in the sigmoid colon, in the descending colon and at the hepatic flexure External and internal hemorrhoids.  6 polyps were tubular adenomas, 2 hyperplastic.  He was advised at that time that we do not routinely screen for polyps after the age of 60 but if he remains healthy he could consider at age 29.   Current Outpatient Medications  Medication Sig Dispense Refill  . acetaminophen (TYLENOL) 500 MG tablet Take 500 mg by mouth every 6 (six) hours as needed for moderate pain.    Marland Kitchen albuterol (VENTOLIN HFA) 108 (90 Base) MCG/ACT inhaler Inhale 1-2 puffs into the lungs every 6 (six) hours as needed for wheezing or shortness of breath.    . ALPRAZolam (XANAX) 1 MG tablet Take 0.5 mg by mouth at bedtime.    Marland Kitchen aspirin EC 81 MG tablet Take 1 tablet (81 mg total) by mouth daily. 90 tablet 3  . Calcium Citrate-Vitamin D (CALCIUM CITRATE + D3 PO) Take 1 tablet by mouth daily.    Marland Kitchen  Carboxymethylcellul-Glycerin (LUBRICATING EYE DROPS OP) Place 1 drop into both eyes 2 (two) times daily.    . carvedilol (COREG) 6.25 MG tablet Take 1 tablet (6.25 mg total) by mouth 2 (two) times daily with a meal. 180 tablet 3  . famotidine (PEPCID) 20 MG tablet Take 20 mg by mouth at bedtime.    . fluticasone (FLONASE) 50 MCG/ACT nasal spray Place into both nostrils daily.    Marland Kitchen losartan (COZAAR) 25 MG tablet TAKE 1/2  TABLET(12.5 MG) BY MOUTH DAILY 15 tablet 0  . melatonin 3 MG TABS tablet Take 4.5 mg by mouth at bedtime.    . Multiple Vitamins-Minerals (SENIOR VITES PO) Take 1 tablet by mouth daily.    Marland Kitchen neomycin-bacitracin-polymyxin (NEOSPORIN) ointment Apply 1 application topically as needed for wound care.    . nitroGLYCERIN (NITROSTAT) 0.4 MG SL tablet Place 1 tablet (0.4 mg total) under the tongue every 5 (five) minutes as needed. (Patient taking differently: Place 0.4 mg under the tongue every 5 (five) minutes as needed for chest pain.) 25 tablet 2  . NON FORMULARY Fluc2% toe nail drops--apply topically once daily for toenail fungus    . omeprazole (PRILOSEC) 20 MG capsule Take 20 mg by mouth daily.    . rosuvastatin (CRESTOR) 40 MG tablet Take 1 tablet (40 mg total) by mouth daily. 90 tablet 3  . sertraline (ZOLOFT) 50 MG tablet Take 50 mg by mouth daily.    . tamsulosin (FLOMAX) 0.4 MG CAPS capsule Take 1 capsule (0.4 mg total) by mouth daily. 30 capsule 2   No current facility-administered medications for this visit.    Allergies as of 08/04/2020  . (No Known Allergies)    ROS:  General: Negative for anorexia, weight loss, fever, chills, fatigue, weakness. ENT: Negative for hoarseness, difficulty swallowing, nasal congestion. CV: Negative for chest pain, angina, palpitations, +dyspnea on exertion, peripheral edema.  Respiratory: Negative for dyspnea at rest, +dyspnea on exertion, cough, sputum, wheezing.  GI: See history of present illness. GU:  Negative for dysuria, hematuria, urinary incontinence, urinary frequency, nocturnal urination.  Endo: Negative for unusual weight change.    Physical Examination:   BP 104/62   Pulse 70   Temp (!) 97.3 F (36.3 C) (Temporal)   Ht 6' (1.829 m)   Wt 175 lb 6.4 oz (79.6 kg)   BMI 23.79 kg/m   General: Well-nourished, well-developed in no acute distress.  Eyes: No icterus. Mouth: masked. Lungs: Clear to auscultation bilaterally.  Heart:  Regular rate and rhythm, no murmurs rubs or gallops.  Abdomen: Bowel sounds are normal, nontender, nondistended, no hepatosplenomegaly or masses, no abdominal bruits or hernia , no rebound or guarding.   Extremities: No lower extremity edema. No clubbing or deformities. Neuro: Alert and oriented x 4   Skin: Warm and dry, no jaundice.   Psych: Alert and cooperative, normal mood and affect.  Assessment: 75 y/o male with h/o COPD, bronchiectasis, right empyema 2016 requiring surgical intervention here for follow up weight loss, change in bowels.   Work up discovered bladder cancer, he is now under care of urology, had surgical resection and instillation of chemotherapy at time of surgery. Requires cystoscopy every 3 months for now. CT also revealed more prominent right inguinal lymph nodes, per urology unrelated to his bladder cancer. Patient has history of prior inguinal hernia repair.   Today patient states he is doing well. BMs more regular. No longer on Linzess, little straining at times. His bloating resolved on omeprazole, pepcid. Weight  stabilized. Appetite improved. We discussed prominent inguinal lymph nodes. Plans for surveillance CT in 10/2020, patient desired 6 month follow up instead of 3 month.     Plan:  1. Miralax or stool softener if worsening constipation. 2. CT pelvic vs A/P, I will check with radiologist. Plans for surveillance imaging in August. 3. Will monitor if bloating and cough issues come back. No recent EGD, patient prefers to hold off  per pt unless something changes. 4. Return to office in 11/2020.

## 2020-08-04 NOTE — Patient Instructions (Addendum)
1. You can use a stool softener daily if needed to manage your bowel movements. If Colace is not effective, try Miralax. 2. Miralax is a safe stool softener that you can use daily if needed.  Use 1 capful in 4 to 6 ounces of fluid daily as needed. I would recommend taking a dose daily on days you do not have a productive stool. 3. We will contact you in 10/2020 to schedule your CT scan to follow up on the prominent lymph nodes seen in the right inguinal area. 4. Let me know if you have recurrent bloating or cough once you wean off Pepcid and omeprazole.  5. We will see you back in late 11/2020.

## 2020-08-10 ENCOUNTER — Encounter: Payer: Self-pay | Admitting: Cardiology

## 2020-08-10 ENCOUNTER — Ambulatory Visit (INDEPENDENT_AMBULATORY_CARE_PROVIDER_SITE_OTHER): Payer: Medicare Other | Admitting: Cardiology

## 2020-08-10 ENCOUNTER — Encounter: Payer: Self-pay | Admitting: Gastroenterology

## 2020-08-10 VITALS — BP 110/60 | HR 66 | Ht 72.0 in | Wt 178.0 lb

## 2020-08-10 DIAGNOSIS — I5022 Chronic systolic (congestive) heart failure: Secondary | ICD-10-CM | POA: Diagnosis not present

## 2020-08-10 DIAGNOSIS — I2510 Atherosclerotic heart disease of native coronary artery without angina pectoris: Secondary | ICD-10-CM | POA: Diagnosis not present

## 2020-08-10 DIAGNOSIS — E782 Mixed hyperlipidemia: Secondary | ICD-10-CM

## 2020-08-10 MED ORDER — LOSARTAN POTASSIUM 25 MG PO TABS: ORAL_TABLET | ORAL | 5 refills | Status: DC

## 2020-08-10 NOTE — Patient Instructions (Signed)

## 2020-08-10 NOTE — Progress Notes (Signed)
Clinical Summary [name] [name] is a 75 y.o.male seen today for follow up of the following medical problems.  1. CAD/ICM/Chronic systolic HF - admit 07/3612 with STEMI. Received DES toLAD, PTCA of diag - 09/2017 echo LVEF 30-35%. He was discharged with lifevest for primary prevention - repeat 10/2017 35-40%, lifevest discontinued - given afib was started on plavix, asa, and anticoag x 1 month, then plavix and anticoag.  - medical therapyinitiallylimited due to soft bp's andpriorrenal dysfunction.   02/2019 echo: LVEF 43-15%, grade I diasotlic dysfunction   - no recent chest pain - SOB/DOE is up and down, followed by Dr Melvyn Novas for COPD - no recent edema Compliant with meds    2. COPD/Chronic cough - followed byDr Melvyn Novas    3. Afibin setting of STEMI - new diagnosis during his admission with acute MI. Unclear if isolated in setting of MI 02/2018 event monitor without recurrent afib, eliquis was stopped  - no recent palpitations   4. Hyperlipidemia   Jan 2022 TC 99 TG 71 HDL 47 LDL 37 - he is on statin  5. Bladder tumor - recent surgery 06/16/20 . The resected tumor was a low-grade urothelial carcinoma the bladder.  No muscle invasion present.    Spends a lot of time at [facility] where he has a place.     Past Medical History:  Diagnosis Date  . Acute systolic heart failure ([place])   . Anxiety   . COPD (chronic obstructive pulmonary disease) ([place])   . Enlarged prostate with lower urinary tract symptoms (LUTS)   . GERD (gastroesophageal reflux disease)   . Hyperlipidemia   . Hypertension   . Low serum testosterone level   . PAF (paroxysmal atrial fibrillation) ([place])   . Pneumonia 05/2014  . Pyothorax without fistula ([place])   . Sciatica   . Shortness of breath dyspnea   . Solitary pulmonary nodule   . STEMI (ST elevation myocardial infarction) ([place])    09/27/17 PCI/DES x1 mLAD, PTCA of the diag Christien Berthelot. EF 30% Lifevest at discharge.    . Testicular hypofunction   . Urinary frequency      No Known Allergies   Current Outpatient Medications  Medication Sig Dispense Refill  . acetaminophen (TYLENOL) 500 MG tablet Take 500 mg by mouth every 6 (six) hours as needed for moderate pain.    Marland Kitchen albuterol (VENTOLIN HFA) 108 (90 Base) MCG/ACT inhaler Inhale 1-2 puffs into the lungs every 6 (six) hours as needed for wheezing or shortness of breath.    . ALPRAZolam (XANAX) 1 MG tablet Take 0.5 mg by mouth at bedtime.    Marland Kitchen aspirin EC 81 MG tablet Take 1 tablet (81 mg total) by mouth daily. 90 tablet 3  . Calcium Citrate-Vitamin D (CALCIUM CITRATE + D3 PO) Take 1 tablet by mouth daily.    . Carboxymethylcellul-Glycerin (LUBRICATING EYE DROPS OP) Place 1 drop into both eyes 2 (two) times daily.    . carvedilol (COREG) 6.25 MG tablet Take 1 tablet (6.25 mg total) by mouth 2 (two) times daily with a meal. 180 tablet 3  . famotidine (PEPCID) 20 MG tablet Take 20 mg by mouth at bedtime.    . fluticasone (FLONASE) 50 MCG/ACT nasal spray Place into both nostrils daily.    Marland Kitchen losartan (COZAAR) 25 MG tablet TAKE 1/2 TABLET(12.5 MG) BY MOUTH DAILY 15 tablet 0  . melatonin 3 MG TABS tablet Take 4.5 mg by mouth at bedtime.    . Multiple Vitamins-Minerals (  SENIOR VITES PO) Take 1 tablet by mouth daily.    Marland Kitchen neomycin-bacitracin-polymyxin (NEOSPORIN) ointment Apply 1 application topically as needed for wound care.    . nitroGLYCERIN (NITROSTAT) 0.4 MG SL tablet Place 1 tablet (0.4 mg total) under the tongue every 5 (five) minutes as needed. (Patient taking differently: Place 0.4 mg under the tongue every 5 (five) minutes as needed for chest pain.) 25 tablet 2  . NON FORMULARY Fluc2% toe nail drops--apply topically once daily for toenail fungus    . omeprazole (PRILOSEC) 20 MG capsule Take 20 mg by mouth daily.    . rosuvastatin (CRESTOR) 40 MG tablet Take 1 tablet (40 mg total) by mouth daily. 90 tablet 3  . sertraline (ZOLOFT) 50 MG tablet Take 50 mg  by mouth daily.    . tamsulosin (FLOMAX) 0.4 MG CAPS capsule Take 1 capsule (0.4 mg total) by mouth daily. 30 capsule 2   No current facility-administered medications for this visit.     Past Surgical History:  Procedure Laterality Date  . APPENDECTOMY    . BACK SURGERY    . COLONOSCOPY  2011   Tubular adenoma per patient  . COLONOSCOPY N/A 03/03/2015   Dr. Oneida Alar: Left colon is redundant, mild diverticulosis, small internal hemorrhoids, moderate external hemorrhoids.  Next colonoscopy in 5 years with overtube.  . COLONOSCOPY N/A 03/25/2019   Procedure: COLONOSCOPY;  Surgeon: Danie Binder, MD;  [facility];  Service: Endoscopy;  Laterality: N/A;  8:30am - Camille's request  . CORONARY/GRAFT ACUTE MI REVASCULARIZATION N/A 09/27/2017   Procedure: Coronary/Graft Acute MI Revascularization;  Surgeon: Burnell Blanks, MD;  Location: [place] [facility];  Service: Cardiovascular;  Laterality: N/A;  . CYSTOSCOPY W/ RETROGRADES Bilateral 06/16/2020   Procedure: CYSTOSCOPY WITH RETROGRADE PYELOGRAM;  Surgeon: [name], MD;  Location: [facility];  Service: Urology;  Laterality: Bilateral;  . EMPYEMA DRAINAGE Right 06/27/2014   Procedure: EMPYEMA DRAINAGE;  Surgeon: Grace Isaac, MD;  Location: [place];  Service: Thoracic;  Laterality: Right;  . HEMORRHOID BANDING N/A 03/03/2015   Procedure: HEMORRHOID BANDING;  Surgeon: Danie Binder, MD;  [facility];  Service: Endoscopy;  Laterality: N/A;  Recomended to see a Psychologist, sport and exercise.  [name]    . INTRAVASCULAR ULTRASOUND/IVUS N/A 09/27/2017   Procedure: Intravascular Ultrasound/IVUS;  Surgeon: Burnell Blanks, MD;  Location: [place] [facility];  Service: Cardiovascular;  Laterality: N/A;  . LEFT HEART CATH AND CORONARY ANGIOGRAPHY N/A 09/27/2017   Procedure: LEFT HEART CATH AND CORONARY ANGIOGRAPHY;  Surgeon: Burnell Blanks, MD;  Location: [place] [facility];  Service: Cardiovascular;  Laterality:  N/A;  . LUNG SURGERY  2016  . POLYPECTOMY  03/25/2019   Procedure: POLYPECTOMY;  Surgeon: Danie Binder, MD;  [facility];  Service: Endoscopy;;  . TEE WITHOUT CARDIOVERSION N/A 07/14/2014   Procedure: TRANSESOPHAGEAL ECHOCARDIOGRAM (TEE);  Surgeon: Sueanne Margarita, MD;  Location: [place];  Service: Cardiovascular;  Laterality: N/A;  . TRANSURETHRAL RESECTION OF BLADDER TUMOR N/A 06/16/2020   Procedure: TRANSURETHRAL RESECTION OF BLADDER TUMOR (TURBT) WITH GEMCITABINE;  Surgeon: [name], MD;  Location: [facility];  Service: Urology;  Laterality: N/A;  . VARICOSE VEIN SURGERY    . VIDEO ASSISTED THORACOSCOPY Right 06/27/2014   Procedure: VIDEO ASSISTED THORACOSCOPY;  Surgeon: Grace Isaac, MD;  Location: [place];  Service: Thoracic;  Laterality: Right;  Marland Kitchen VIDEO BRONCHOSCOPY N/A 06/27/2014   Procedure: VIDEO BRONCHOSCOPY;  Surgeon: Percell Miller  Maryruth Bun, MD;  Location: [facility];  Service: Thoracic;  Laterality: N/A;     No Known Allergies    Family History  Problem Relation Age of Onset  . Tuberculosis Father   . Hypertension Father   . COPD Mother   . Hypertension Mother   . Colon cancer Neg Hx      Social History Mr. Wenke reports that he quit smoking about 6 years ago. His smoking use included pipe and cigarettes. He has a 10.00 pack-year smoking history. He has never used smokeless tobacco. Mr. Pollack reports previous alcohol use.   Review of Systems CONSTITUTIONAL: No weight loss, fever, chills, weakness or fatigue.  HEENT: Eyes: No visual loss, blurred vision, double vision or yellow sclerae.No hearing loss, sneezing, congestion, runny nose or sore throat.  SKIN: No rash or itching.  CARDIOVASCULAR:per hpi  RESPIRATORY: No shortness of breath, cough or sputum.  GASTROINTESTINAL: No anorexia, nausea, vomiting or diarrhea. No abdominal pain or blood.  GENITOURINARY: No burning on urination, no polyuria NEUROLOGICAL: No headache, dizziness, syncope, paralysis,  ataxia, numbness or tingling in the extremities. No change in bowel or bladder control.  MUSCULOSKELETAL: No muscle, back pain, joint pain or stiffness.  LYMPHATICS: No enlarged nodes. No history of splenectomy.  PSYCHIATRIC: No history of depression or anxiety.  ENDOCRINOLOGIC: No reports of sweating, cold or heat intolerance. No polyuria or polydipsia.  Marland Kitchen   Physical Examination Today's Vitals   08/10/20 1516  BP: 110/60  Pulse: 66  SpO2: 96%  Weight: 178 lb (80.7 kg)  Height: 6' (1.829 m)   Body mass index is 24.14 kg/m.  Gen: resting comfortably, no acute distress HEENT: no scleral icterus, pupils equal round and reactive, no palptable cervical adenopathy,  CV: RRR, no m/r/g, no jvd Resp: Clear to auscultation bilaterally GI: abdomen is soft, non-tender, non-distended, normal bowel sounds, no hepatosplenomegaly MSK: extremities are warm, no edema.  Skin: warm, no rash Neuro:  no focal deficits Psych: appropriate affect   Diagnostic Studies 09/2017 cath  Prox RCA lesion is 30% stenosed.  Prox LAD lesion is 100% stenosed.  Prox Cx lesion is 20% stenosed.  A drug-eluting stent was successfully placed using a STENT SYNERGY DES 3X16.  Post intervention, there is a 0% residual stenosis.  Ost 1st Diag lesion is 100% stenosed.  Balloon angioplasty was performed using a BALLOON SAPPHIRE 2.5X12.  Post intervention, there is a 40% residual stenosis.  Prox LAD to Mid LAD lesion is 40% stenosed.  1. Acute anterior STEMI secondary to occlusion of the mid LAD 2. Successful PTCA/DES x 1 mid LAD 3. Successful PTCA/angioplasty ostium of the Diagonal Rihanna [place].  4. Mild non-obstructive disease in the RCA, circumflex and mid LAD  Post cathRecommendations:  Will admit to the ICU and continue Aggrastat for 6 hours. Will start high intensity statin and a beta blocker. Echo later today.  . Recommend uninterrupted dual antiplatelet therapy with Aspirin 81mg  daily and Ticagrelor  90mg  twice dailyfor a minimum of 12 months (ACS - Class I recommendation).   09/2017 echo Study Conclusions  - Left ventricle: The cavity size was normal. There was mild focal basal hypertrophy of the septum. Systolic function was moderately to severely reduced. The estimated ejection fraction was in the range of 30% to 35%. Severe hypokinesis of the mid to apical anteroseptal, anterior, and anterolateral myocardium. Dyskinetic at apex. Doppler parameters are consistent with abnormal left ventricular relaxation (grade 1 diastolic dysfunction). - Aortic valve: Trileaflet; mildly thickened, mildly calcified leaflets. -  Mitral valve: Calcified annulus. There was trivial regurgitation. - Pulmonary arteries: PA peak pressure: 41 mm Hg (S). - Pericardium, extracardiac: A small pericardial effusion was identified.  Impressions:  - New reduction in LV EF with regional wall motion abnormalities in the mid to apical anteroseptum, anterior, and anterolateral walls with dyskinesis at the apex. Small pericardial effusion noted.  10/2017 echo Study Conclusions  - Left ventricle: The cavity size was normal. Wall thickness was increased in a pattern of moderate LVH. Systolic function was moderately reduced. The estimated ejection fraction was in the range of 35% to 40%. Doppler parameters are consistent with abnormal left ventricular relaxation (grade 1 diastolic dysfunction). Doppler parameters are consistent with high ventricular filling pressure. - Regional wall motion abnormality: Hypokinesis of the apical septal, apical lateral, and apical myocardium. - Aortic valve: Mildly calcified annulus. Trileaflet; mildly thickened leaflets. Valve area (VTI): 4.91 cm^2. Valve area (Vmax): 4.27 cm^2. Valve area (Vmean): 3.54 cm^2. - Mitral valve: Mildly calcified annulus. Mildly thickened leaflets . - Left atrium: The atrium was moderately  dilated. - Pericardium, extracardiac: There is a small circumferential pericardial effusion. The effusion measures 0.8 cm adjacent to the LV. There is no evidence of tamponade physiology. - Technically adequate study.   02/2018 heart monitor  21 day event monitor  Min HR 48, Max HR 130, Avg HR 71. Min HR occurred in early AM hours presumably while sleeping  No symptoms reported  4 beat run of NSVT that was asymptomatic   02/2019 echo  IMPRESSIONS   1. Left ventricular ejection fraction, by visual estimation, is 40 to 45%. The left ventricle has mildly decreased function. There is moderately increased left ventricular hypertrophy. 2. Elevated left atrial pressure. 3. Left ventricular diastolic parameters are consistent with Grade I diastolic dysfunction (impaired relaxation). 4. Hypokinesis of the apex, mid to distal anteroseptal, apical lateral, and apical myocardium. 5. Global right ventricle has normal systolic function.The right ventricular size is normal. No increase in right ventricular wall thickness. 6. Left atrial size was moderately dilated. 7. Right atrial size was normal. 8. The mitral valve is normal in structure. Mild mitral valve regurgitation. No evidence of mitral stenosis. 9. The tricuspid valve is normal in structure. Tricuspid valve regurgitation is not demonstrated. 10. The aortic valve is tricuspid. Aortic valve regurgitation is not visualized. No evidence of aortic valve sclerosis or stenosis. 11. The pulmonic valve was not well visualized. Pulmonic valve regurgitation is not visualized. 12. The inferior vena cava is normal in size with greater than 50% respiratory variability, suggesting right atrial pressure of 3 mmHg.    Assessment and Plan  1. CAD/ICM/chronic systolic HF -no significant symptoms - medical therapy has been limited by prior renal dysfunction, soft bp's - renal function significantly improved, bp's low normal -  repeat echo, if ongoing dysfunction would start farxiga. Could consider low dose aldactone. I don't think at this time his bp's would tolerate entresto.   2. Hyperlipidemia - continue statin      Arnoldo Lenis, M.D.

## 2020-08-12 ENCOUNTER — Ambulatory Visit: Payer: Medicare Other | Admitting: Cardiology

## 2020-09-15 DIAGNOSIS — M791 Myalgia, unspecified site: Secondary | ICD-10-CM | POA: Diagnosis not present

## 2020-09-15 DIAGNOSIS — M62838 Other muscle spasm: Secondary | ICD-10-CM | POA: Diagnosis not present

## 2020-10-08 ENCOUNTER — Telehealth: Payer: Self-pay | Admitting: Gastroenterology

## 2020-10-08 DIAGNOSIS — Z8551 Personal history of malignant neoplasm of bladder: Secondary | ICD-10-CM

## 2020-10-08 DIAGNOSIS — R590 Localized enlarged lymph nodes: Secondary | ICD-10-CM

## 2020-10-08 NOTE — Telephone Encounter (Signed)
CT scheduled for 8/12 at 4:00pm, arrival 3:45pm, npo 4 hrs prior, p/u oral prep from radiology  Called pt and aware of appt details. He voiced understanding.

## 2020-10-08 NOTE — Telephone Encounter (Signed)
Patient needs CT A/P with contrast 10/2020 Dx: abnormal inguinal lymph nodes, h/o bladder cancer, surveillance CT

## 2020-10-08 NOTE — Addendum Note (Signed)
Addended by: Cheron Every on: 10/08/2020 02:12 PM   Modules accepted: Orders

## 2020-10-23 DIAGNOSIS — Z23 Encounter for immunization: Secondary | ICD-10-CM | POA: Diagnosis not present

## 2020-10-26 ENCOUNTER — Ambulatory Visit (INDEPENDENT_AMBULATORY_CARE_PROVIDER_SITE_OTHER): Payer: Medicare Other | Admitting: Urology

## 2020-10-26 ENCOUNTER — Encounter: Payer: Self-pay | Admitting: Urology

## 2020-10-26 ENCOUNTER — Other Ambulatory Visit: Payer: Self-pay

## 2020-10-26 VITALS — BP 130/73 | HR 80 | Ht 72.0 in | Wt 180.0 lb

## 2020-10-26 DIAGNOSIS — N3289 Other specified disorders of bladder: Secondary | ICD-10-CM | POA: Diagnosis not present

## 2020-10-26 DIAGNOSIS — C679 Malignant neoplasm of bladder, unspecified: Secondary | ICD-10-CM | POA: Insufficient documentation

## 2020-10-26 LAB — URINALYSIS, COMPLETE
Bilirubin, UA: NEGATIVE
Glucose, UA: NEGATIVE
Ketones, UA: NEGATIVE
Leukocytes,UA: NEGATIVE
Nitrite, UA: NEGATIVE
Protein,UA: NEGATIVE
RBC, UA: NEGATIVE
Specific Gravity, UA: 1.015 (ref 1.005–1.030)
Urobilinogen, Ur: 0.2 mg/dL (ref 0.2–1.0)
pH, UA: 6 (ref 5.0–7.5)

## 2020-10-26 LAB — MICROSCOPIC EXAMINATION
Bacteria, UA: NONE SEEN
Epithelial Cells (non renal): NONE SEEN /HPF (ref 0–10)

## 2020-10-26 NOTE — Progress Notes (Signed)
   10/26/20  CC:  Chief Complaint  Patient presents with   Cysto    Urologic history: 1.  Urothelial carcinoma bladder TURBT 06/16/2020 <2 cm papillary bladder tumor Post resection intravesical gemcitabine Pathology low-grade, noninvasive urothelial carcinoma bladder   HPI: No complaints since TURBT.  No voiding difficulty or gross hematuria  Refer to rooming tab for vitals NED. A&Ox3.   No respiratory distress   Abd soft, NT, ND Normal phallus with bilateral descended testicles  Cystoscopy Procedure Note  Patient identification was confirmed, informed consent was obtained, and patient was prepped using Betadine solution.  Lidocaine jelly was administered per urethral meatus.     Pre-Procedure: - Inspection reveals a normal caliber urethral meatus.  Procedure: The flexible cystoscope was introduced without difficulty - No urethral strictures/lesions are present. -Prominent lateral lobe enlargement prostate  -Moderate elevation bladder neck - Bilateral ureteral orifices identified - Bladder mucosa  reveals no ulcers, tumors, or lesions - No bladder stones -Mild to moderate trabeculation, scattered diverticula  Retroflexion shows no abnormalities   Post-Procedure: - Patient tolerated the procedure well  Assessment/ Plan: No evidence recurrent urothelial carcinoma Schedule surveillance cystoscopy 9 months   [name], MD

## 2020-10-28 DIAGNOSIS — E785 Hyperlipidemia, unspecified: Secondary | ICD-10-CM | POA: Diagnosis not present

## 2020-10-28 DIAGNOSIS — I10 Essential (primary) hypertension: Secondary | ICD-10-CM | POA: Diagnosis not present

## 2020-10-29 DIAGNOSIS — H6122 Impacted cerumen, left ear: Secondary | ICD-10-CM | POA: Diagnosis not present

## 2020-10-29 DIAGNOSIS — H838X3 Other specified diseases of inner ear, bilateral: Secondary | ICD-10-CM | POA: Diagnosis not present

## 2020-10-29 DIAGNOSIS — H6981 Other specified disorders of Eustachian tube, right ear: Secondary | ICD-10-CM | POA: Diagnosis not present

## 2020-10-29 DIAGNOSIS — H903 Sensorineural hearing loss, bilateral: Secondary | ICD-10-CM | POA: Diagnosis not present

## 2020-10-30 ENCOUNTER — Other Ambulatory Visit: Payer: Self-pay

## 2020-10-30 ENCOUNTER — Ambulatory Visit (HOSPITAL_COMMUNITY)
Admission: RE | Admit: 2020-10-30 | Discharge: 2020-10-30 | Disposition: A | Discharge status: Home / Self Care | Payer: Medicare Other | Source: Ambulatory Visit | Attending: Gastroenterology | Admitting: Gastroenterology

## 2020-10-30 DIAGNOSIS — R590 Localized enlarged lymph nodes: Secondary | ICD-10-CM | POA: Insufficient documentation

## 2020-10-30 DIAGNOSIS — K7689 Other specified diseases of liver: Secondary | ICD-10-CM | POA: Diagnosis not present

## 2020-10-30 DIAGNOSIS — Z8551 Personal history of malignant neoplasm of bladder: Secondary | ICD-10-CM | POA: Diagnosis not present

## 2020-10-30 DIAGNOSIS — N323 Diverticulum of bladder: Secondary | ICD-10-CM | POA: Diagnosis not present

## 2020-10-30 DIAGNOSIS — N281 Cyst of kidney, acquired: Secondary | ICD-10-CM | POA: Diagnosis not present

## 2020-10-30 DIAGNOSIS — B351 Tinea unguium: Secondary | ICD-10-CM | POA: Diagnosis not present

## 2020-10-30 LAB — POCT I-STAT CREATININE: Creatinine, Ser: 1.3 mg/dL — ABNORMAL HIGH (ref 0.61–1.24)

## 2020-10-30 MED ORDER — IOHEXOL 350 MG/ML SOLN
85.0000 mL | Freq: Once | INTRAVENOUS | Status: AC | PRN
  Administered 2020-10-30: 85 mL via INTRAVENOUS

## 2020-11-02 DIAGNOSIS — I5022 Chronic systolic (congestive) heart failure: Secondary | ICD-10-CM | POA: Diagnosis not present

## 2020-11-02 DIAGNOSIS — R053 Chronic cough: Secondary | ICD-10-CM | POA: Diagnosis not present

## 2020-11-02 DIAGNOSIS — K4090 Unilateral inguinal hernia, without obstruction or gangrene, not specified as recurrent: Secondary | ICD-10-CM | POA: Diagnosis not present

## 2020-11-02 DIAGNOSIS — J449 Chronic obstructive pulmonary disease, unspecified: Secondary | ICD-10-CM | POA: Diagnosis not present

## 2020-11-02 DIAGNOSIS — F5105 Insomnia due to other mental disorder: Secondary | ICD-10-CM | POA: Diagnosis not present

## 2020-11-02 DIAGNOSIS — Z0001 Encounter for general adult medical examination with abnormal findings: Secondary | ICD-10-CM | POA: Diagnosis not present

## 2020-11-02 DIAGNOSIS — R0600 Dyspnea, unspecified: Secondary | ICD-10-CM | POA: Diagnosis not present

## 2020-11-02 DIAGNOSIS — I2510 Atherosclerotic heart disease of native coronary artery without angina pectoris: Secondary | ICD-10-CM | POA: Diagnosis not present

## 2020-11-02 DIAGNOSIS — R634 Abnormal weight loss: Secondary | ICD-10-CM | POA: Diagnosis not present

## 2020-11-02 DIAGNOSIS — F331 Major depressive disorder, recurrent, moderate: Secondary | ICD-10-CM | POA: Diagnosis not present

## 2020-11-02 DIAGNOSIS — C679 Malignant neoplasm of bladder, unspecified: Secondary | ICD-10-CM | POA: Diagnosis not present

## 2020-11-02 DIAGNOSIS — K5909 Other constipation: Secondary | ICD-10-CM | POA: Diagnosis not present

## 2020-11-03 ENCOUNTER — Ambulatory Visit (HOSPITAL_COMMUNITY)
Admission: RE | Admit: 2020-11-03 | Discharge: 2020-11-03 | Disposition: A | Discharge status: Home / Self Care | Payer: Medicare Other | Source: Ambulatory Visit | Attending: Cardiology | Admitting: Cardiology

## 2020-11-03 ENCOUNTER — Other Ambulatory Visit: Payer: Self-pay

## 2020-11-03 DIAGNOSIS — I5022 Chronic systolic (congestive) heart failure: Secondary | ICD-10-CM | POA: Diagnosis not present

## 2020-11-03 LAB — ECHOCARDIOGRAM COMPLETE
AV Mean grad: 3 mmHg
AV Peak grad: 7 mmHg
Ao pk vel: 1.32 m/s
Area-P 1/2: 4.26 cm2
Calc EF: 45.5 %
S' Lateral: 4.48 cm
Single Plane A2C EF: 37.3 %
Single Plane A4C EF: 52.3 %

## 2020-11-04 ENCOUNTER — Encounter: Payer: Self-pay | Admitting: Internal Medicine

## 2020-11-17 ENCOUNTER — Telehealth: Payer: Self-pay | Admitting: *Deleted

## 2020-11-17 MED ORDER — DAPAGLIFLOZIN PROPANEDIOL 10 MG PO TABS: 10.0000 mg | ORAL_TABLET | Freq: Every day | ORAL | 6 refills | Status: DC

## 2020-11-17 NOTE — Telephone Encounter (Signed)
Laurine Blazer, LPN  [id number]  075-GRM PM EDT Back to Top    Notified, copy to pcp.    He agrees to starting [place].  Will send new rx to pharm now.

## 2020-11-17 NOTE — Telephone Encounter (Signed)
-----   Message from Arnoldo Lenis, MD sent at 11/13/2020 12:36 PM EDT ----- Heart function is mildly decreased at 40-45% (normal is 50-60%), need to keep working with medications to further strengthen it. Can we start farxiga '10mg'$  daily  Zandra Abts MD

## 2020-11-27 DIAGNOSIS — Z23 Encounter for immunization: Secondary | ICD-10-CM | POA: Diagnosis not present

## 2020-12-07 DIAGNOSIS — I2583 Coronary atherosclerosis due to lipid rich plaque: Secondary | ICD-10-CM | POA: Diagnosis not present

## 2020-12-07 DIAGNOSIS — R7309 Other abnormal glucose: Secondary | ICD-10-CM | POA: Diagnosis not present

## 2020-12-07 DIAGNOSIS — Z955 Presence of coronary angioplasty implant and graft: Secondary | ICD-10-CM

## 2020-12-07 DIAGNOSIS — Z79899 Other long term (current) drug therapy: Secondary | ICD-10-CM | POA: Diagnosis not present

## 2020-12-07 DIAGNOSIS — R399 Unspecified symptoms and signs involving the genitourinary system: Secondary | ICD-10-CM | POA: Diagnosis not present

## 2020-12-07 DIAGNOSIS — I252 Old myocardial infarction: Secondary | ICD-10-CM | POA: Diagnosis not present

## 2020-12-07 DIAGNOSIS — N410 Acute prostatitis: Secondary | ICD-10-CM | POA: Diagnosis not present

## 2020-12-07 DIAGNOSIS — I2510 Atherosclerotic heart disease of native coronary artery without angina pectoris: Secondary | ICD-10-CM | POA: Diagnosis not present

## 2020-12-07 DIAGNOSIS — C679 Malignant neoplasm of bladder, unspecified: Secondary | ICD-10-CM | POA: Diagnosis not present

## 2020-12-07 DIAGNOSIS — I213 ST elevation (STEMI) myocardial infarction of unspecified site: Secondary | ICD-10-CM

## 2020-12-07 DIAGNOSIS — R81 Glycosuria: Secondary | ICD-10-CM | POA: Diagnosis not present

## 2020-12-07 DIAGNOSIS — R911 Solitary pulmonary nodule: Secondary | ICD-10-CM | POA: Insufficient documentation

## 2020-12-07 DIAGNOSIS — N401 Enlarged prostate with lower urinary tract symptoms: Secondary | ICD-10-CM | POA: Diagnosis not present

## 2020-12-07 DIAGNOSIS — J479 Bronchiectasis, uncomplicated: Secondary | ICD-10-CM | POA: Diagnosis not present

## 2020-12-07 DIAGNOSIS — J449 Chronic obstructive pulmonary disease, unspecified: Secondary | ICD-10-CM | POA: Diagnosis not present

## 2020-12-08 ENCOUNTER — Ambulatory Visit: Payer: Medicare Other | Admitting: Gastroenterology

## 2020-12-29 ENCOUNTER — Encounter: Payer: Self-pay | Admitting: Internal Medicine

## 2020-12-29 ENCOUNTER — Other Ambulatory Visit: Payer: Self-pay

## 2020-12-29 ENCOUNTER — Ambulatory Visit (INDEPENDENT_AMBULATORY_CARE_PROVIDER_SITE_OTHER): Payer: Medicare Other | Admitting: Internal Medicine

## 2020-12-29 DIAGNOSIS — J479 Bronchiectasis, uncomplicated: Secondary | ICD-10-CM | POA: Diagnosis not present

## 2020-12-29 DIAGNOSIS — I2510 Atherosclerotic heart disease of native coronary artery without angina pectoris: Secondary | ICD-10-CM

## 2020-12-29 DIAGNOSIS — J449 Chronic obstructive pulmonary disease, unspecified: Secondary | ICD-10-CM

## 2020-12-29 NOTE — Assessment & Plan Note (Signed)
See HRCT  02/18/20 Diffuse bronchial wall thickening with mild to moderate centrilobular and paraseptal emphysema. High-resolution images also demonstrate widespread areas of cylindrical bronchiectasis with considerable mucous plugging with assoc nodules typical of MAI  - sputum 03/30/20   Nl flora  - sputum afb 03/30/20  >>> neg smear, neg culture  - Quant Igs 04/27/2020    nl  - Alpha one phenotype  04/27/2020   MM level 141  - Flutter valve rec  05/15/20 > reviewed 06/23/2020   Well compensated at present on mostly just prn mucinex  with probable response to bactrim recently rx'd for UTI so can probably use this interchangeably with zpak prn purulent sputum.  F/u with HRCT due end of November or early Dec 2022 due to concern with MAI and also smoking hx puts him at risk of lung cancer now out 6 years with 9 more to go by present guidelines (15 y p quits)  Discussed in detail all the  indications, usual  risks and alternatives  relative to the benefits with patient who agrees to proceed with w/u as outlined.     >>> f/u can be yearly          Each maintenance medication was reviewed in detail including emphasizing most importantly the difference between maintenance and prns and under what circumstances the prns are to be triggered using an action plan format where appropriate.  Total time for H and P, chart review, counseling, reviewing flutter device(s) and generating customized AVS unique to this office visit / same day charting = 31 min

## 2020-12-29 NOTE — Assessment & Plan Note (Signed)
MM/Quit all smoking 2016 with dx of R empyema with chronic cough since then - Try off spiriva 05/21/2019  - PFT's  08/06/19  FEV1 2.60 (75 % ) ratio 0.64  p 14 % improvement from saba p 0 prior to study with DLCO  18.09 (66%) corrects to 3.14 (79%)  for alv volume and FV curve minimally concave  -  04/27/2020   Walked RA  approx   600 ft  @ fast pace  stopped due to  Sob with sats 99%    -  04/27/2020     alpha one AT phenotype  MM level 141  - 05/15/2020  After extensive coaching inhaler device,  effectiveness =    75% from a baseline of well < 50%  So ok to rechallenge with prn saba  Has not had any convincing response to bronchodilators so mostly just dealing with mucociliary dysfunction here which appears to be permanent

## 2020-12-29 NOTE — Patient Instructions (Signed)
No change in medications  - if zpak does not clear up the nasty mucus > call for for BACTRIM  = sulfa drug    Please schedule a follow up visit in  6  months but call sooner if needed

## 2020-12-29 NOTE — Progress Notes (Signed)
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 [name] for eval of COPD. Former Dr [name] pt. He is maintained on spiriva and does not use a rescue inhaler. He states his breathing is doing well currently.    Dyspnea:  3 x weekly treadmill x 50 min at 3.6 -4 and 4.5  Cough: comes and goes x years variably productive not noct  Sleep: does fine in recliner / can do flat in bed  SABA use: none  rec Try off spiriva to see if you notice any difference in your exercise tolerance or cough For cough > mucinex dm up to 1200 mg every 12 hours as needed  Please schedule a follow up visit in 2 months at [place] office with PFT's first if possible   08/06/19  NP dx GOLD II barely   rec Activity as tolerated.  Would discuss with Cardiology that Lisinopril may be aggravating your cough .  Trial of Stiolto 2 puffs daily >  Helped some, too expensive > bevespi  May use Albuterol Inhaler 1-2 puffs every 6hrs as needed.    12/13/2019  f/u ov/Brance Dartt re:  GOLD II barely / maint on bevespi and off lisinopril  08/13/19 Chief Complaint  Patient presents with   Follow-up    productive cough with thick, yellow phlegm  Dyspnea:  Treadmill x up to 55 min 4pm x 4%  Cough: ever since empyema surgery but changed early September 2021 better p zpak Sleeping: in recliner due to back / minimal cough  SABA use: none  02: none  rec Breztri Take 2 puffs first thing in am and then another 2 puffs about 12 hours later x 2 weeks and stop and then restart bevespi only if notice deterioration Pantoprazole  (protonix) 40 mg   Take  30-60 min before first meal of the day and Pepcid (famotidine)  20 mg one after  Supper until return to office  GERD diet  Please remember to go to the lab and [facility] at [facility]   for your tests - we will call you with the results when they are available. Please schedule a follow up office visit in 6 weeks, call sooner if needed    01/21/2020  f/u ov/[name] re: COPD GOLD II /  cough x 2016 when dx with R empyema  Chief Complaint  Patient presents with   Follow-up    productive cough with pinkish color phlegm  Dyspnea:  No change ex tol treadmill on or off breztri vs bevespi vs nothin  Cough: actually better while on treadmill/ still pink tinged variably on baby asa daily  Sleeping: worse on L side down  SABA use: none  02: none  rec For changed mucus from your normal go ahead and use zpak  If mucus gets really bloody, stop the baby aspirin until better  Ok to stop the reflux meds  - you may need to take pepcid 20 mg twice daily x one week then one daily x week  and stop   schedule your CT chest and sinus and the results > bronchiectasis/ no sinusitis      04/27/2020  f/u ov/[name] office/[name] re: GOLD II/ bronchiectasis  Chief Complaint  Patient presents with   Follow-up    Shortness of breath with activity, productive cough with yellow phlegm  Dyspnea:  Hardly ever doing the treadmill / uphill to mailbox and back 7-8 min sob/tired Cough: less productive,  More irritative Sleeping: L side down makes same as usual / 10 degrees bed blocks  SABA use: has not tried albuterol  02: none  Covid status: triple vax  Lung cancer screening: just had hrct  rec Ok to try  albuterol (or bztri) 15 min before an activity  Bronchiectasis =   you have scarring of your bronchial tubes Make sure you check your oxygen saturation at your highest level of activity to be sure it stays over 90%     05/15/2020  f/u ov/[name] office/[name] re: copd II/  bronchiectasis Chief Complaint  Patient presents with   Follow-up    "its been more difficult to cough" Productive cough with yellow phlegm   rx mucinex dm in minimal doses  Dyspnea:  mb and back and 10 min about the same before or after saba Cough: worse in evening not sure before or after supper   Sleeping: better on bed blocks / worse on L side, sleeps p meds but then once stands up  In am   starts coughing x  1 hour SABA use: rarely use now, not sure it helps  02: none  Covid status: vax x 3  rec For cough/congestion > mucinex dm  Up to 1200 mg every 12 hours as needed and add flutter  For nasty mucus > zpak  Try prilosec otc 20mg   Take 30-60 min before first meal of the day and Pepcid ac (famotidine) 20 mg one after  Until return  Work on inhaler technique:  Only use your albuterol as a rescue medication   Ok to continue to Try albuterol 15 min before an activity that you know would make you short of breath       06/23/2020  f/u ov/[place] office/[name] re: copd II/ bronchiectasis on flutter /mucinex/ gerd rx  Chief Complaint  Patient presents with   Follow-up    Breathing is overall doing well and his cough has improved some. He still has some prod cough with yellow sputum. He has not needed his albuterol inhaler.  Dyspnea:  tol more activity  Cough: slt yellow worse in am = baseline  Sleeping: bed blocks  SABA use: none 02:none  Covid status: vax x 3  - 4th on the way  Chest tightness better p gerd rx  Rec After 3 months try off prilosec and replace it and replace  pepcid taken after bfast and continue after supper x 2 weeks then after that drop off the am pepcid and 2 weeks after than stop the pm pepcid > if tightness flares then see GI doctor  Make sure the flutter valve flutters on expiration as much you want  For cough / congestion the maximum dose of mucinex or mucinex dm is 2400 mg in 24 hours    12/29/2020  f/u ov/[place] office/[name] re: GOLD 2  copd/bronchiectasis  maint on no meds/ off gerd rx, no more chest tightness.   Chief Complaint  Patient presents with   Follow-up    Sob comes and goes per patient. cough was worse over  the summer but starting in September sees improvement. Coughs up yellow mucus.   Dyspnea:  treadmill x 45 min 3 x per week at 3.8 mph and include 4  s chest tightess  Cough: slt yellow typical / worse in am > improved on bactrim = zpak  Sleeping: bed blocks L side down more cough  SABA use: none did not help 02: none  Covid status: vax x 5  Lung cancer screening: HRCT yearly end of nov 2022 due Mucinex dm takes less now    No obvious day to day or daytime variability or assoc excess/ purulent sputum or mucus plugs or hemoptysis or cp or chest tightness, subjective wheeze or overt sinus or hb symptoms.    Also denies any obvious fluctuation of symptoms with weather or environmental changes or other aggravating or alleviating factors except as outlined above   No unusual exposure hx or h/o childhood pna/ asthma or knowledge of premature birth.  Current Allergies, Complete Past Medical History, Past Surgical History, Family History, and Social History were reviewed in [employer] record.  ROS  The following are not active complaints unless bolded Hoarseness, sore throat, dysphagia, dental problems, itching, sneezing,  nasal congestion or discharge of excess mucus or purulent secretions, ear ache,   fever, chills, sweats, unintended wt loss or wt gain, classically pleuritic or exertional cp,  orthopnea pnd or arm/hand swelling  or leg swelling, presyncope, palpitations, abdominal pain, anorexia, nausea, vomiting, diarrhea  or change in bowel habits or change in bladder habits, change in stools or change in urine, dysuria, hematuria,  rash, arthralgias, visual complaints, headache, numbness, weakness or ataxia or problems with walking or coordination,  change in mood or  memory.         Current Meds  Medication Sig   acetaminophen (TYLENOL) 500 MG tablet Take 500 mg by mouth every 6 (six) hours as needed for moderate pain.   aspirin EC 81 MG tablet Take 1 tablet (81 mg total) by mouth daily.   Calcium Citrate-Vitamin D (CALCIUM CITRATE + D3 PO) Take 1 tablet by mouth daily.   Carboxymethylcellul-Glycerin (LUBRICATING EYE DROPS OP) Place 1 drop into both eyes 2 (two) times daily.   carvedilol (COREG) 6.25 MG tablet Take 1 tablet (6.25 mg total) by mouth 2 (two) times daily with a meal.   dapagliflozin propanediol (FARXIGA) 10 MG TABS tablet Take 1 tablet (10 mg total) by mouth daily.   losartan (COZAAR) 25 MG tablet TAKE 1/2 TABLET(12.5 MG) BY MOUTH DAILY   melatonin 3 MG TABS tablet Take 4.5 mg by mouth at bedtime.   Multiple Vitamins-Minerals (SENIOR VITES PO) Take 1 tablet by mouth daily.   nitroGLYCERIN (NITROSTAT) 0.4 MG SL tablet Place 1 tablet (0.4 mg total) under the tongue every 5 (five) minutes as needed.   NON FORMULARY Fluc2% toe nail drops--apply topically once daily for toenail fungus   rosuvastatin (CRESTOR) 40 MG tablet Take 1 tablet (40 mg total) by mouth daily.   sertraline (ZOLOFT) 50 MG tablet Take 50 mg by mouth daily.   tamsulosin (FLOMAX) 0.4 MG CAPS capsule Take 1 capsule (0.4 mg total) by mouth daily.                Past Medical History:  Diagnosis Date   Acute systolic heart failure (HCC)    COPD (chronic obstructive pulmonary disease) (HCC)    Enlarged prostate with lower urinary tract symptoms (LUTS)    Hyperlipidemia    Hypertension  Low serum testosterone level    PAF (paroxysmal atrial fibrillation) (HCC)    Pneumonia 05/2014   Pyothorax without fistula (HCC)    Sciatica    Shortness of breath dyspnea    Solitary pulmonary nodule    STEMI (ST elevation myocardial infarction) ([place])    09/27/17 PCI/DES x1 mLAD, PTCA of the diag branch. EF 30% Lifevest at discharge.    Testicular hypofunction    Urinary frequency        Objective:    12/29/2020      182  06/23/2020          173   05/15/2020        174 04/27/2020          176  01/21/2020        192   12/13/19 192 lb 3.2 oz (87.2 kg)  08/06/19 198 lb (89.8 kg)  06/24/19 198 lb (89.8 kg)      Vital signs reviewed  12/29/2020  - Note at rest 02 sats  98% on RA   General appearance:    amb wm nad     HEENT : pt wearing mask not removed for exam due to covid - 19 concerns.   NECK :  without JVD/Nodes/TM/ nl carotid upstrokes bilaterally   LUNGS: no acc muscle use,  Min barrel  contour chest wall with bilateral  slightly decreased bs s audible wheeze and  without cough on insp or exp maneuvers and min  Hyperresonant  to  percussion bilaterally     CV:  RRR  no s3 or murmur or increase in P2, and no edema   ABD:  soft and nontender with pos end  insp Hoover's  in the supine position. No bruits or organomegaly appreciated, bowel sounds nl  MS:   Nl gait/  ext warm without deformities, calf tenderness, cyanosis or clubbing No obvious joint restrictions   SKIN: warm and dry without lesions    NEURO:  alert, approp, nl sensorium with  no motor or cerebellar deficits apparent.                Assessment

## 2021-01-29 DIAGNOSIS — B351 Tinea unguium: Secondary | ICD-10-CM | POA: Diagnosis not present

## 2021-02-02 ENCOUNTER — Other Ambulatory Visit: Payer: Self-pay | Admitting: Student

## 2021-02-02 ENCOUNTER — Other Ambulatory Visit: Payer: Self-pay | Admitting: Cardiology

## 2021-02-02 NOTE — Telephone Encounter (Signed)
This is a [place] pt, Dr. Branch 

## 2021-02-17 ENCOUNTER — Other Ambulatory Visit (HOSPITAL_COMMUNITY): Payer: Medicare Other

## 2021-02-18 ENCOUNTER — Ambulatory Visit (INDEPENDENT_AMBULATORY_CARE_PROVIDER_SITE_OTHER): Payer: Medicare Other | Admitting: Cardiology

## 2021-02-18 ENCOUNTER — Encounter: Payer: Self-pay | Admitting: Cardiology

## 2021-02-18 VITALS — BP 128/70 | HR 58 | Ht 72.0 in | Wt 183.2 lb

## 2021-02-18 DIAGNOSIS — E782 Mixed hyperlipidemia: Secondary | ICD-10-CM | POA: Diagnosis not present

## 2021-02-18 DIAGNOSIS — I2510 Atherosclerotic heart disease of native coronary artery without angina pectoris: Secondary | ICD-10-CM

## 2021-02-18 DIAGNOSIS — I5022 Chronic systolic (congestive) heart failure: Secondary | ICD-10-CM

## 2021-02-18 MED ORDER — SACUBITRIL-VALSARTAN 24-26 MG PO TABS: 1.0000 | ORAL_TABLET | Freq: Two times a day (BID) | ORAL | 3 refills | Status: DC

## 2021-02-18 MED ORDER — SACUBITRIL-VALSARTAN 24-26 MG PO TABS: 1.0000 | ORAL_TABLET | Freq: Two times a day (BID) | ORAL | 0 refills | Status: DC

## 2021-02-18 MED ORDER — DAPAGLIFLOZIN PROPANEDIOL 10 MG PO TABS: 10.0000 mg | ORAL_TABLET | Freq: Every day | ORAL | 3 refills | Status: DC

## 2021-02-18 NOTE — Patient Instructions (Addendum)
Medication Instructions:  Stop Losartan (Cozaar). Begin Entresto 24/26mg  twice a day - may begin 02/21/2021. Farxiga refilled today.  Continue all other medications.     Labwork: Please call the office after having labs done with Dr. Nevada Crane Our office will request at that time.   Testing/Procedures: none  Follow-Up: 2  months   Any Other Special Instructions Will Be Listed Below (If Applicable).   If you need a refill on your cardiac medications before your next appointment, please call your pharmacy.

## 2021-02-18 NOTE — Progress Notes (Signed)
Clinical Summary [name] [name] is a 75 y.o.male seen today for follow up of the following medical problems.    1. CAD/ICM/Chronic systolic HF - admit 09/9890 with STEMI. Received DES to LAD, PTCA of diag - 09/2017 echo LVEF 30-35%. He was discharged with lifevest for primary prevention - repeat 10/2017 35-40%, lifevest discontinued - given afib was started on plavix, asa, and anticoag x 1 month, then plavix and anticoag.    - medical therapy initially limited due to soft bp's and prior renal dysfunction.      02/2019 echo: LVEF 11-94%, grade I diasotlic dysfunction    03/7406 echo: LVEF 40-45%, grade I dd, normal RV, mild MR -after recent echo started farxiga 10mg  daily - no recent symptoms   2. COPD/Chronic cough - followed by Dr Melvyn Novas       3. Afib in setting of STEMI - new diagnosis during his admission with acute MI. Unclear if isolated in setting of MI  02/2018 event monitor without recurrent afib, eliquis was stopped   - denies any palpitations     4. Hyperlipidemia  Jan 2022 TC 99 TG 71 HDL 47 LDL 37 - compliant with rosuvastatin   5. Bladder tumor - recent surgery 06/16/20 . The resected tumor was a low-grade urothelial carcinoma the bladder.  No muscle invasion present.       Spends a lot of time at [facility] where he has a place.      Past Medical History:  Diagnosis Date   Acute systolic heart failure (HCC)    Anxiety    COPD (chronic obstructive pulmonary disease) (HCC)    Enlarged prostate with lower urinary tract symptoms (LUTS)    GERD (gastroesophageal reflux disease)    Hyperlipidemia    Hypertension    Low serum testosterone level    PAF (paroxysmal atrial fibrillation) ([place])    Pneumonia 05/2014   Pyothorax without fistula (HCC)    Sciatica    Shortness of breath dyspnea    Solitary pulmonary nodule    STEMI (ST elevation myocardial infarction) ([place])    09/27/17 PCI/DES x1 mLAD, PTCA of the diag [name] [name]. EF 30% Lifevest at discharge.     Testicular hypofunction    Urinary frequency      No Known Allergies   Current Outpatient Medications  Medication Sig Dispense Refill   acetaminophen (TYLENOL) 500 MG tablet Take 500 mg by mouth every 6 (six) hours as needed for moderate pain.     aspirin EC 81 MG tablet Take 1 tablet (81 mg total) by mouth daily. 90 tablet 3   Calcium Citrate-Vitamin D (CALCIUM CITRATE + D3 PO) Take 1 tablet by mouth daily.     Carboxymethylcellul-Glycerin (LUBRICATING EYE DROPS OP) Place 1 drop into both eyes 2 (two) times daily.     carvedilol (COREG) 6.25 MG tablet TAKE 1 TABLET(6.25 MG) BY MOUTH TWICE DAILY WITH A MEAL 180 tablet 3   dapagliflozin propanediol (FARXIGA) 10 MG TABS tablet Take 1 tablet (10 mg total) by mouth daily. 30 tablet 6   losartan (COZAAR) 25 MG tablet TAKE 1/2 TABLET(12.5 MG) BY MOUTH DAILY 45 tablet 5   melatonin 3 MG TABS tablet Take 4.5 mg by mouth at bedtime.     Multiple Vitamins-Minerals (SENIOR VITES PO) Take 1 tablet by mouth daily.     nitroGLYCERIN (NITROSTAT) 0.4 MG SL tablet Place 1 tablet (0.4 mg total) under the tongue every 5 (five) minutes as needed. 25 tablet 2  NON FORMULARY Fluc2% toe nail drops--apply topically once daily for toenail fungus     rosuvastatin (CRESTOR) 40 MG tablet TAKE 1 TABLET(40 MG) BY MOUTH DAILY 90 tablet 3   sertraline (ZOLOFT) 50 MG tablet Take 50 mg by mouth daily.     tamsulosin (FLOMAX) 0.4 MG CAPS capsule Take 1 capsule (0.4 mg total) by mouth daily. 30 capsule 2   No current facility-administered medications for this visit.     Past Surgical History:  Procedure Laterality Date   APPENDECTOMY     BACK SURGERY     COLONOSCOPY  2011   Tubular adenoma per patient   COLONOSCOPY N/A 03/03/2015   Dr. Oneida Alar: Left colon is redundant, mild diverticulosis, small internal hemorrhoids, moderate external hemorrhoids.  Next colonoscopy in 5 years with overtube.   COLONOSCOPY N/A 03/25/2019   Procedure: COLONOSCOPY;  Surgeon: Danie Binder, MD;  [facility];  Service: Endoscopy;  Laterality: N/A;  8:30am - Camille's request   CORONARY/GRAFT ACUTE MI REVASCULARIZATION N/A 09/27/2017   Procedure: Coronary/Graft Acute MI Revascularization;  Surgeon: Burnell Blanks, MD;  Location: [place] [facility];  Service: Cardiovascular;  Laterality: N/A;   CYSTOSCOPY W/ RETROGRADES Bilateral 06/16/2020   Procedure: CYSTOSCOPY WITH RETROGRADE PYELOGRAM;  Surgeon: [name], MD;  Location: [facility];  Service: Urology;  Laterality: Bilateral;   EMPYEMA DRAINAGE Right 06/27/2014   Procedure: EMPYEMA DRAINAGE;  Surgeon: Grace Isaac, MD;  Location: [place];  Service: Thoracic;  Laterality: Right;   HEMORRHOID BANDING N/A 03/03/2015   Procedure: Thayer Jew;  Surgeon: Danie Binder, MD;  [facility];  Service: Endoscopy;  Laterality: N/A;  Recomended to see a Psychologist, sport and exercise.   HERNIA REPAIR     INTRAVASCULAR ULTRASOUND/IVUS N/A 09/27/2017   Procedure: Intravascular Ultrasound/IVUS;  Surgeon: Burnell Blanks, MD;  Location: [place] [facility];  Service: Cardiovascular;  Laterality: N/A;   LEFT HEART CATH AND CORONARY ANGIOGRAPHY N/A 09/27/2017   Procedure: LEFT HEART CATH AND CORONARY ANGIOGRAPHY;  Surgeon: Burnell Blanks, MD;  Location: [place] [facility];  Service: Cardiovascular;  Laterality: N/A;   LUNG SURGERY  2016   POLYPECTOMY  03/25/2019   Procedure: POLYPECTOMY;  Surgeon: Danie Binder, MD;  [facility];  Service: Endoscopy;;   TEE WITHOUT CARDIOVERSION N/A 07/14/2014   Procedure: TRANSESOPHAGEAL ECHOCARDIOGRAM (TEE);  Surgeon: Sueanne Margarita, MD;  Location: [place];  Service: Cardiovascular;  Laterality: N/A;   TRANSURETHRAL RESECTION OF BLADDER TUMOR N/A 06/16/2020   Procedure: TRANSURETHRAL RESECTION OF BLADDER TUMOR (TURBT) WITH GEMCITABINE;  Surgeon: [name], MD;  Location: [facility];  Service: Urology;  Laterality: N/A;   VARICOSE VEIN SURGERY      VIDEO ASSISTED THORACOSCOPY Right 06/27/2014   Procedure: VIDEO ASSISTED THORACOSCOPY;  Surgeon: Grace Isaac, MD;  Location: [place];  Service: Thoracic;  Laterality: Right;   VIDEO BRONCHOSCOPY N/A 06/27/2014   Procedure: VIDEO BRONCHOSCOPY;  Surgeon: Grace Isaac, MD;  Location: [facility];  Service: Thoracic;  Laterality: N/A;     No Known Allergies    Family History  Problem Relation Age of Onset   Tuberculosis Father    Hypertension Father    COPD Mother    Hypertension Mother    Colon cancer Neg Hx      Social History Mr. Wint reports that he quit smoking about 6 years ago. His smoking use included pipe and cigarettes. He has a 10.00 pack-year smoking history. He  has never used smokeless tobacco. Mr. Gancarz reports that he does not currently use alcohol.   Review of Systems CONSTITUTIONAL: No weight loss, fever, chills, weakness or fatigue.  HEENT: Eyes: No visual loss, blurred vision, double vision or yellow sclerae.No hearing loss, sneezing, congestion, runny nose or sore throat.  SKIN: No rash or itching.  CARDIOVASCULAR: per hpi RESPIRATORY: No shortness of breath, cough or sputum.  GASTROINTESTINAL: No anorexia, nausea, vomiting or diarrhea. No abdominal pain or blood.  GENITOURINARY: No burning on urination, no polyuria NEUROLOGICAL: No headache, dizziness, syncope, paralysis, ataxia, numbness or tingling in the extremities. No change in bowel or bladder control.  MUSCULOSKELETAL: No muscle, back pain, joint pain or stiffness.  LYMPHATICS: No enlarged nodes. No history of splenectomy.  PSYCHIATRIC: No history of depression or anxiety.  ENDOCRINOLOGIC: No reports of sweating, cold or heat intolerance. No polyuria or polydipsia.  Marland Kitchen   Physical Examination Today's Vitals   02/18/21 1015  BP: 128/70  Pulse: (!) 58  SpO2: 98%  Weight: 183 lb 3.2 oz (83.1 kg)  Height: 6' (1.829 m)   Body mass index is 24.85 kg/m.  Gen: resting comfortably, no acute  distress HEENT: no scleral icterus, pupils equal round and reactive, no palptable cervical adenopathy,  CV: RRR, no mr/g no jvd Resp: Clear to auscultation bilaterally GI: abdomen is soft, non-tender, non-distended, normal bowel sounds, no hepatosplenomegaly MSK: extremities are warm, no edema.  Skin: warm, no rash Neuro:  no focal deficits Psych: appropriate affect   Diagnostic Studies  09/2017 cath Prox RCA lesion is 30% stenosed. Prox LAD lesion is 100% stenosed. Prox Cx lesion is 20% stenosed. A drug-eluting stent was successfully placed using a STENT SYNERGY DES 3X16. Post intervention, there is a 0% residual stenosis. Ost 1st Diag lesion is 100% stenosed. Balloon angioplasty was performed using a BALLOON SAPPHIRE 2.5X12. Post intervention, there is a 40% residual stenosis. Prox LAD to Mid LAD lesion is 40% stenosed.   1. Acute anterior STEMI secondary to occlusion of the mid LAD 2. Successful PTCA/DES x 1 mid LAD 3. Successful PTCA/angioplasty ostium of the Diagonal [name] [name].  4. Mild non-obstructive disease in the RCA, circumflex and mid LAD   Post cath Recommendations:  Will admit to the ICU and continue Aggrastat for 6 hours. Will start high intensity statin and a beta blocker. Echo later today.   . Recommend uninterrupted dual antiplatelet therapy with Aspirin 81mg  daily and Ticagrelor 90mg  twice daily for a minimum of 12 months (ACS - Class I recommendation).      09/2017 echo Study Conclusions   - Left ventricle: The cavity size was normal. There was mild focal   basal hypertrophy of the septum. Systolic function was moderately   to severely reduced. The estimated ejection fraction was in the   range of 30% to 35%. Severe hypokinesis of the mid to apical   anteroseptal, anterior, and anterolateral myocardium. Dyskinetic   at apex. Doppler parameters are consistent with abnormal left   ventricular relaxation (grade 1 diastolic dysfunction). - Aortic valve:  Trileaflet; mildly thickened, mildly calcified   leaflets. - Mitral valve: Calcified annulus. There was trivial regurgitation. - Pulmonary arteries: PA peak pressure: 41 mm Hg (S). - Pericardium, extracardiac: A small pericardial effusion was   identified.   Impressions:   - New reduction in LV EF with regional wall motion abnormalities in   the mid to apical anteroseptum, anterior, and anterolateral walls   with dyskinesis at the apex. Small pericardial effusion noted.  10/2017 echo Study Conclusions   - Left ventricle: The cavity size was normal. Wall thickness was   increased in a pattern of moderate LVH. Systolic function was   moderately reduced. The estimated ejection fraction was in the   range of 35% to 40%. Doppler parameters are consistent with   abnormal left ventricular relaxation (grade 1 diastolic   dysfunction). Doppler parameters are consistent with high   ventricular filling pressure. - Regional wall motion abnormality: Hypokinesis of the apical   septal, apical lateral, and apical myocardium. - Aortic valve: Mildly calcified annulus. Trileaflet; mildly   thickened leaflets. Valve area (VTI): 4.91 cm^2. Valve area   (Vmax): 4.27 cm^2. Valve area (Vmean): 3.54 cm^2. - Mitral valve: Mildly calcified annulus. Mildly thickened leaflets   . - Left atrium: The atrium was moderately dilated. - Pericardium, extracardiac: There is a small circumferential   pericardial effusion. The effusion measures 0.8 cm adjacent to   the LV. There is no evidence of tamponade physiology. - Technically adequate study.     02/2018 heart monitor 21 day event monitor Min HR 48, Max HR 130, Avg HR 71. Min HR occurred in early AM hours presumably while sleeping No symptoms reported 4 beat run of NSVT that was asymptomatic     02/2019 echo   IMPRESSIONS      1. Left ventricular ejection fraction, by visual estimation, is 40 to 45%. The left ventricle has mildly decreased function.  There is moderately increased left ventricular hypertrophy.  2. Elevated left atrial pressure.  3. Left ventricular diastolic parameters are consistent with Grade I diastolic dysfunction (impaired relaxation).  4. Hypokinesis of the apex, mid to distal anteroseptal, apical lateral, and apical myocardium.  5. Global right ventricle has normal systolic function.The right ventricular size is normal. No increase in right ventricular wall thickness.  6. Left atrial size was moderately dilated.  7. Right atrial size was normal.  8. The mitral valve is normal in structure. Mild mitral valve regurgitation. No evidence of mitral stenosis.  9. The tricuspid valve is normal in structure. Tricuspid valve regurgitation is not demonstrated. 10. The aortic valve is tricuspid. Aortic valve regurgitation is not visualized. No evidence of aortic valve sclerosis or stenosis. 11. The pulmonic valve was not well visualized. Pulmonic valve regurgitation is not visualized. 12. The inferior vena cava is normal in size with greater than 50% respiratory variability, suggesting right atrial pressure of 3 mmHg.   10/2020 echo 1. Left ventricular ejection fraction, by estimation, is 40 to 45%. The  left ventricle has mildly decreased function. The left ventricle  demonstrates regional wall motion abnormalities (see scoring  diagram/findings for description). There is mild left  ventricular hypertrophy. Left ventricular diastolic parameters are  consistent with Grade I diastolic dysfunction (impaired relaxation).   2. Right ventricular systolic function is normal. The right ventricular  size is normal. Tricuspid regurgitation signal is inadequate for assessing  PA pressure.   3. Left atrial size was mildly dilated.   4. The mitral valve is degenerative. Mild mitral valve regurgitation. The  mean mitral valve gradient is 1.0 mmHg.   5. The aortic valve is tricuspid. There is mild calcification of the  aortic valve.  Aortic valve regurgitation is not visualized. Mild aortic  valve sclerosis is present, with no evidence of aortic valve stenosis.  Aortic valve mean gradient measures 3.0  mmHg.   6. The inferior vena cava is normal in size with greater than 50%  respiratory variability, suggesting right  atrial pressure of 3 mmHg.     Assessment and Plan  1. CAD/ICM/chronic systolic HF - no significant symptoms - medical therapy previously limited by prior renal dysfunction, soft bp's however both issues have resolved.  - will d/c losartan and start entresto 24/26mg  bid. If tolerates would add aldactone at next visit. Has upcomign labs with pcp next week, he will call when these are available.  -EKG today shows mild sinus brady with chronic ST/T changes   2. Hyperlipidemia - has been at goal, continue rosuvatatin.    F/u 2 months in Leda Min, M.D.

## 2021-02-19 ENCOUNTER — Ambulatory Visit (HOSPITAL_COMMUNITY)
Admission: RE | Admit: 2021-02-19 | Discharge: 2021-02-19 | Disposition: A | Discharge status: Home / Self Care | Payer: Medicare Other | Source: Ambulatory Visit | Attending: Internal Medicine | Admitting: Internal Medicine

## 2021-02-19 ENCOUNTER — Other Ambulatory Visit: Payer: Self-pay

## 2021-02-19 DIAGNOSIS — I2510 Atherosclerotic heart disease of native coronary artery without angina pectoris: Secondary | ICD-10-CM | POA: Diagnosis not present

## 2021-02-19 DIAGNOSIS — J479 Bronchiectasis, uncomplicated: Secondary | ICD-10-CM | POA: Diagnosis not present

## 2021-02-19 DIAGNOSIS — J432 Centrilobular emphysema: Secondary | ICD-10-CM | POA: Diagnosis not present

## 2021-02-19 DIAGNOSIS — J929 Pleural plaque without asbestos: Secondary | ICD-10-CM | POA: Diagnosis not present

## 2021-02-24 ENCOUNTER — Telehealth: Payer: Self-pay | Admitting: Internal Medicine

## 2021-02-24 DIAGNOSIS — R7301 Impaired fasting glucose: Secondary | ICD-10-CM | POA: Diagnosis not present

## 2021-02-24 DIAGNOSIS — I2510 Atherosclerotic heart disease of native coronary artery without angina pectoris: Secondary | ICD-10-CM | POA: Diagnosis not present

## 2021-02-24 NOTE — Telephone Encounter (Signed)
Called and spoke to patient. Conversation documented in result encounter.

## 2021-02-26 ENCOUNTER — Telehealth: Payer: Self-pay | Admitting: Cardiology

## 2021-02-26 NOTE — Telephone Encounter (Signed)
Patient given Dr.Branch's message and agrees with dispo.

## 2021-02-26 NOTE — Telephone Encounter (Signed)
Labs requested from pcp. I will forward message to Br.Branch regarding CT

## 2021-02-26 NOTE — Telephone Encounter (Signed)
Awaiting labs. CT showed some plaque in the arteries of his heart which would be expected given his history of coronary disease, there is some calcification of his aortic valve which had also shown up on his prior echocardiogram which showed it was just mild at that time, calcification of the heart valves can occur with aging. Would not be anyhting additional to do since echo a few months ago showed it was just mild  Zandra Abts MD

## 2021-02-26 NOTE — Telephone Encounter (Signed)
   Pt said he got his blood work done at his pcp office on 12/07, he said if Dr. Nelly Laurence nurse can request a copy of his lab work so Dr. Harl Bowie can see it. Also, he had CT done recently ordered by Dr. Melvyn Novas. There's some result there related to his heart and requested if Dr. Harl Bowie can check his CT result too

## 2021-03-01 DIAGNOSIS — R7301 Impaired fasting glucose: Secondary | ICD-10-CM | POA: Diagnosis not present

## 2021-03-01 DIAGNOSIS — C679 Malignant neoplasm of bladder, unspecified: Secondary | ICD-10-CM | POA: Diagnosis not present

## 2021-03-01 DIAGNOSIS — F5105 Insomnia due to other mental disorder: Secondary | ICD-10-CM | POA: Diagnosis not present

## 2021-03-01 DIAGNOSIS — J449 Chronic obstructive pulmonary disease, unspecified: Secondary | ICD-10-CM | POA: Diagnosis not present

## 2021-03-01 DIAGNOSIS — R634 Abnormal weight loss: Secondary | ICD-10-CM | POA: Diagnosis not present

## 2021-03-01 DIAGNOSIS — F331 Major depressive disorder, recurrent, moderate: Secondary | ICD-10-CM | POA: Diagnosis not present

## 2021-03-01 DIAGNOSIS — R053 Chronic cough: Secondary | ICD-10-CM | POA: Diagnosis not present

## 2021-03-01 DIAGNOSIS — I5022 Chronic systolic (congestive) heart failure: Secondary | ICD-10-CM | POA: Diagnosis not present

## 2021-03-01 DIAGNOSIS — K5909 Other constipation: Secondary | ICD-10-CM | POA: Diagnosis not present

## 2021-03-01 DIAGNOSIS — R0600 Dyspnea, unspecified: Secondary | ICD-10-CM | POA: Diagnosis not present

## 2021-03-01 DIAGNOSIS — I2510 Atherosclerotic heart disease of native coronary artery without angina pectoris: Secondary | ICD-10-CM | POA: Diagnosis not present

## 2021-03-01 DIAGNOSIS — K4090 Unilateral inguinal hernia, without obstruction or gangrene, not specified as recurrent: Secondary | ICD-10-CM | POA: Diagnosis not present

## 2021-03-02 ENCOUNTER — Encounter: Payer: Self-pay | Admitting: Urology

## 2021-03-19 DIAGNOSIS — E782 Mixed hyperlipidemia: Secondary | ICD-10-CM | POA: Diagnosis not present

## 2021-03-19 DIAGNOSIS — I10 Essential (primary) hypertension: Secondary | ICD-10-CM | POA: Diagnosis not present

## 2021-03-23 ENCOUNTER — Ambulatory Visit: Payer: Medicare Other | Admitting: Gastroenterology

## 2021-03-23 DIAGNOSIS — H04123 Dry eye syndrome of bilateral lacrimal glands: Secondary | ICD-10-CM | POA: Diagnosis not present

## 2021-03-24 ENCOUNTER — Other Ambulatory Visit: Payer: Self-pay

## 2021-03-24 ENCOUNTER — Ambulatory Visit (INDEPENDENT_AMBULATORY_CARE_PROVIDER_SITE_OTHER): Payer: Medicare Other | Admitting: Gastroenterology

## 2021-03-24 ENCOUNTER — Encounter: Payer: Self-pay | Admitting: Gastroenterology

## 2021-03-24 VITALS — BP 110/60 | HR 65 | Temp 97.3°F | Ht 72.0 in | Wt 184.6 lb

## 2021-03-24 DIAGNOSIS — K5900 Constipation, unspecified: Secondary | ICD-10-CM

## 2021-03-24 NOTE — Patient Instructions (Addendum)
Return office visit as needed. Please call with any questions or concerns.

## 2021-03-24 NOTE — Progress Notes (Signed)
Primary Care Physician: Celene Squibb, MD  Primary Gastroenterologist:  Elon Alas. Abbey Chatters, DO   Chief Complaint  Patient presents with   Constipation    Reports BM are once a day, minimal straining, no bleeding    HPI: [name] [name] is a 76 y.o. male here for follow-up.  Patient last seen in the office in May 2022.  History of constipation.  Diagnosed with low-grade urothelial carcinoma with squamous differentiation, noninvasive in March 2022.  History of several enlarged inguinal lymph nodes as well as widespread areas of mucoid impaction in the right lung seen on prior CT abdomen and pelvis.  Urologist did not think right inguinal lymph nodes related to bladder cancer given low-grade cancer.  Constipation - previously tried 64mcg and not effective enough.  145 mcg daily previously caused diarrhea. At last ov was not on any medication and doing ok.  Recommended MiraLAX or stool softener if worsening in bowel habits.  Pulmonologist felt like he has a history of silent reflux.  Had been on Pepcid at bedtime, omeprazole in the a.m. has since weaned off by pulmonologist without any decline in respiratory status or increased cough.   Last colonoscopy January 2021 for positive Cologuard.  He had eight 2 to 6 mm polyps in the sigmoid colon, splenic flexure, transverse colon, hepatic flexure, ascending colon.  Diverticulosis.  External and internal hemorrhoids.  6 polyps were tubular adenomas.  Consider colonoscopy at age 50 if he remains healthy.  CT abdomen pelvis with contrast August 2022 for follow-up of inguinal lymph nodes.  Previous 1.3 cm mural lesion within the right side of the bladder no longer visualized.  Previous borderline enlarged right inguinal lymph node decreased in size in the interval. No pathologically enlarged inguinal lymph nodes seen on follow up. Chronic postinflammatory changes within the lung bases seen.  Weight is up 10 pounds since May 2022. No longer on reflux  medication. No heartburn. No abdominal pain. BM once daily, minimal straining. No melena, brbpr. Goes back to urologist in 07/2021 for another cytoscopy.   Current Outpatient Medications  Medication Sig Dispense Refill   acetaminophen (TYLENOL) 500 MG tablet Take 500 mg by mouth every 6 (six) hours as needed for moderate pain.     ALPRAZolam (XANAX) 0.5 MG tablet Take 0.5 mg by mouth at bedtime. As needed     aspirin EC 81 MG tablet Take 1 tablet (81 mg total) by mouth daily. 90 tablet 3   Calcium Citrate-Vitamin D (CALCIUM CITRATE + D3 PO) Take 1 tablet by mouth daily.     Carboxymethylcellul-Glycerin (LUBRICATING EYE DROPS OP) Place 1 drop into both eyes 2 (two) times daily. As needed     carvedilol (COREG) 6.25 MG tablet TAKE 1 TABLET(6.25 MG) BY MOUTH TWICE DAILY WITH A MEAL 180 tablet 3   dapagliflozin propanediol (FARXIGA) 10 MG TABS tablet Take 1 tablet (10 mg total) by mouth daily. 90 tablet 3   melatonin 3 MG TABS tablet Take 5 mg by mouth at bedtime.     Multiple Vitamins-Minerals (SENIOR VITES PO) Take 1 tablet by mouth daily.     nitroGLYCERIN (NITROSTAT) 0.4 MG SL tablet Place 1 tablet (0.4 mg total) under the tongue every 5 (five) minutes as needed. 25 tablet 2   NON FORMULARY Fluc2% toe nail drops--apply topically once daily for toenail fungus     rosuvastatin (CRESTOR) 40 MG tablet TAKE 1 TABLET(40 MG) BY MOUTH DAILY 90 tablet 3   sacubitril-valsartan (  ENTRESTO) 24-26 MG Take 1 tablet by mouth 2 (two) times daily. 180 tablet 3   sertraline (ZOLOFT) 50 MG tablet Take 25 mg by mouth daily.     tamsulosin (FLOMAX) 0.4 MG CAPS capsule Take 1 capsule (0.4 mg total) by mouth daily. 30 capsule 2   No current facility-administered medications for this visit.    Allergies as of 03/24/2021   (No Known Allergies)    ROS:  General: Negative for anorexia, weight loss, fever, chills, fatigue, weakness. ENT: Negative for hoarseness, difficulty swallowing , nasal congestion. CV:  Negative for chest pain, angina, palpitations, +dyspnea on exertion, peripheral edema.  Respiratory: Negative for dyspnea at rest,+ dyspnea on exertion, cough, sputum, wheezing.  GI: See history of present illness. GU:  Negative for dysuria, hematuria, urinary incontinence, urinary frequency, nocturnal urination.  Endo: Negative for unusual weight change.    Physical Examination:   BP (!) 91/54    Pulse 65    Temp (!) 97.3 F (36.3 C)    Ht 6' (1.829 m)    Wt 184 lb 9.6 oz (83.7 kg)    BMI 25.04 kg/m  repeat BP 110/60.  General: Well-nourished, well-developed in no acute distress.  Eyes: No icterus. Mouth: masked. Lungs: scattered rhonchi, diminished breath sounds in the bases.Marland Kitchen  Heart: Regular rate and rhythm, no murmurs rubs or gallops.  Abdomen: Bowel sounds are normal, nontender, nondistended, no hepatosplenomegaly or masses, no abdominal bruits or hernia , no rebound or guarding.   Extremities: No lower extremity edema. No clubbing or deformities. Neuro: Alert and oriented x 4   Skin: Warm and dry, no jaundice.   Psych: Alert and cooperative, normal mood and affect.  Labs:  Labs 11/2020: Cre 1.06, alb 3.8, Tbili 0.3, AP 67, AST 20, ALT 23, glucose 87, A1C 5.7.  Labs 02/2021: Hgb 14.7. platelets normal.   Imaging Studies: No results found.   Assessment:  Weight loss: occurring earlier in 2022. Resolved. Has gained 10 pounds since his last ov here. He will continue to monitor at home and call with any weight loss.   Constipation: doing well. BMs regular without any laxatives or medicaitons. Continue to monitor. Consider miralax daily if needed.   Prominent right inguinal lymph nodes: urologist did not suspect related to load grade urothelial cancer. Fortunately follow up CT showed non-pathological lymph nodes, the prior largest one had reduce in sized. No further CT surveillance advised.    Plan: Monitor for unexplained weight loss at home. Use miralax or stool softener if  needed for constipaiton.  Return as needed.

## 2021-04-12 DIAGNOSIS — F331 Major depressive disorder, recurrent, moderate: Secondary | ICD-10-CM | POA: Diagnosis not present

## 2021-04-12 DIAGNOSIS — R52 Pain, unspecified: Secondary | ICD-10-CM | POA: Diagnosis not present

## 2021-04-23 ENCOUNTER — Encounter: Payer: Self-pay | Admitting: Cardiology

## 2021-04-23 ENCOUNTER — Other Ambulatory Visit: Payer: Self-pay

## 2021-04-23 ENCOUNTER — Ambulatory Visit (INDEPENDENT_AMBULATORY_CARE_PROVIDER_SITE_OTHER): Payer: Medicare Other | Admitting: Cardiology

## 2021-04-23 ENCOUNTER — Other Ambulatory Visit (HOSPITAL_COMMUNITY)
Admission: RE | Admit: 2021-04-23 | Discharge: 2021-04-23 | Disposition: A | Discharge status: Home / Self Care | Payer: Medicare Other | Source: Ambulatory Visit | Attending: Cardiology | Admitting: Cardiology

## 2021-04-23 VITALS — BP 115/71 | HR 69 | Ht 72.0 in | Wt 188.4 lb

## 2021-04-23 DIAGNOSIS — I2510 Atherosclerotic heart disease of native coronary artery without angina pectoris: Secondary | ICD-10-CM

## 2021-04-23 DIAGNOSIS — I5022 Chronic systolic (congestive) heart failure: Secondary | ICD-10-CM | POA: Diagnosis not present

## 2021-04-23 DIAGNOSIS — E782 Mixed hyperlipidemia: Secondary | ICD-10-CM | POA: Diagnosis not present

## 2021-04-23 LAB — BASIC METABOLIC PANEL
Anion gap: 6 (ref 5–15)
BUN: 28 mg/dL — ABNORMAL HIGH (ref 8–23)
CO2: 29 mmol/L (ref 22–32)
Calcium: 9 mg/dL (ref 8.9–10.3)
Chloride: 104 mmol/L (ref 98–111)
Creatinine, Ser: 1.26 mg/dL — ABNORMAL HIGH (ref 0.61–1.24)
GFR, Estimated: 59 mL/min — ABNORMAL LOW (ref >60)
Glucose, Bld: 89 mg/dL (ref 70–99)
Potassium: 4.7 mmol/L (ref 3.5–5.1)
Sodium: 139 mmol/L (ref 135–145)

## 2021-04-23 LAB — MAGNESIUM: Magnesium: 2.2 mg/dL (ref 1.7–2.4)

## 2021-04-23 MED ORDER — DAPAGLIFLOZIN PROPANEDIOL 10 MG PO TABS: 10.0000 mg | ORAL_TABLET | Freq: Every day | ORAL | 3 refills | Status: DC

## 2021-04-23 NOTE — Patient Instructions (Signed)
Medication Instructions:  Your physician recommends that you continue on your current medications as directed. Please refer to the Current Medication list given to you today.   Labwork:  Bmet,magnesium today  Testing/Procedures: None today  Follow-Up: 3 months  Any Other Special Instructions Will Be Listed Below (If Applicable).  If you need a refill on your cardiac medications before your next appointment, please call your pharmacy.

## 2021-04-23 NOTE — Progress Notes (Signed)
Clinical Summary [name] [name] is a 76 y.o.male seen today for follow up of the following medical problems.    1. CAD/ICM/Chronic systolic HF - admit 05/863 with STEMI. Received DES to LAD, PTCA of diag - 09/2017 echo LVEF 30-35%. He was discharged with lifevest for primary prevention - repeat 10/2017 35-40%, lifevest discontinued - given afib was started on plavix, asa, and anticoag x 1 month, then plavix and anticoag.    - medical therapy initially limited due to soft bp's and prior renal dysfunction.      02/2019 echo: LVEF 78-46%, grade I diasotlic dysfunction  11/6293 echo: LVEF 40-45%, grade I dd, normal RV, mild MR   - last visit changed losartan to entresto 24/26mg  bid Repeat labs Cr 1.33, historically he has been 1.10-1.30. - no recent SOB/DOE, no LE edema   2. COPD/Chronic cough - followed by Dr Melvyn Novas       3. Afib in setting of STEMI - new diagnosis during his admission with acute MI. Unclear if isolated in setting of MI  02/2018 event monitor without recurrent afib, eliquis was stopped      4. Hyperlipidemia  Jan 2022 TC 99 TG 71 HDL 47 LDL 37 - he is on rosuvatatin  - 02/2021 TC 116 TG 59 HDL 43 LDL 59   5. Bladder tumor -  surgery 06/16/20 . The resected tumor was a low-grade urothelial carcinoma the bladder.  No muscle invasion present.       Spends a lot of time at [facility] where he has a place.        Past Medical History:  Diagnosis Date   Acute systolic heart failure (HCC)    Anxiety    COPD (chronic obstructive pulmonary disease) (HCC)    Enlarged prostate with lower urinary tract symptoms (LUTS)    GERD (gastroesophageal reflux disease)    Hyperlipidemia    Hypertension    Low serum testosterone level    PAF (paroxysmal atrial fibrillation) ([place])    Pneumonia 05/2014   Pyothorax without fistula (HCC)    Sciatica    Shortness of breath dyspnea    Solitary pulmonary nodule    STEMI (ST elevation myocardial infarction) ([place])     09/27/17 PCI/DES x1 mLAD, PTCA of the diag [name] [name]. EF 30% Lifevest at discharge.    Testicular hypofunction    Urinary frequency      No Known Allergies   Current Outpatient Medications  Medication Sig Dispense Refill   acetaminophen (TYLENOL) 500 MG tablet Take 500 mg by mouth every 6 (six) hours as needed for moderate pain.     ALPRAZolam (XANAX) 0.5 MG tablet Take 0.5 mg by mouth at bedtime. As needed     aspirin EC 81 MG tablet Take 1 tablet (81 mg total) by mouth daily. 90 tablet 3   Calcium Citrate-Vitamin D (CALCIUM CITRATE + D3 PO) Take 1 tablet by mouth daily.     Carboxymethylcellul-Glycerin (LUBRICATING EYE DROPS OP) Place 1 drop into both eyes 2 (two) times daily. As needed     carvedilol (COREG) 6.25 MG tablet TAKE 1 TABLET(6.25 MG) BY MOUTH TWICE DAILY WITH A MEAL 180 tablet 3   dapagliflozin propanediol (FARXIGA) 10 MG TABS tablet Take 1 tablet (10 mg total) by mouth daily. 90 tablet 3   melatonin 3 MG TABS tablet Take 5 mg by mouth at bedtime.     Multiple Vitamins-Minerals (SENIOR VITES PO) Take 1 tablet by mouth daily.  nitroGLYCERIN (NITROSTAT) 0.4 MG SL tablet Place 1 tablet (0.4 mg total) under the tongue every 5 (five) minutes as needed. 25 tablet 2   NON FORMULARY Fluc2% toe nail drops--apply topically once daily for toenail fungus     rosuvastatin (CRESTOR) 40 MG tablet TAKE 1 TABLET(40 MG) BY MOUTH DAILY 90 tablet 3   sacubitril-valsartan (ENTRESTO) 24-26 MG Take 1 tablet by mouth 2 (two) times daily. 180 tablet 3   sertraline (ZOLOFT) 50 MG tablet Take 25 mg by mouth daily.     tamsulosin (FLOMAX) 0.4 MG CAPS capsule Take 1 capsule (0.4 mg total) by mouth daily. 30 capsule 2   No current facility-administered medications for this visit.     Past Surgical History:  Procedure Laterality Date   APPENDECTOMY     BACK SURGERY     COLONOSCOPY  2011   Tubular adenoma per patient   COLONOSCOPY N/A 03/03/2015   Dr. Oneida Alar: Left colon is redundant, mild  diverticulosis, small internal hemorrhoids, moderate external hemorrhoids.  Next colonoscopy in 5 years with overtube.   COLONOSCOPY N/A 03/25/2019   Procedure: COLONOSCOPY;  Surgeon: Danie Binder, MD;  [facility];  Service: Endoscopy;  Laterality: N/A;  8:30am - Camille's request   CORONARY/GRAFT ACUTE MI REVASCULARIZATION N/A 09/27/2017   Procedure: Coronary/Graft Acute MI Revascularization;  Surgeon: Burnell Blanks, MD;  Location: [place] [facility];  Service: Cardiovascular;  Laterality: N/A;   CYSTOSCOPY W/ RETROGRADES Bilateral 06/16/2020   Procedure: CYSTOSCOPY WITH RETROGRADE PYELOGRAM;  Surgeon: [name], MD;  Location: [facility];  Service: Urology;  Laterality: Bilateral;   EMPYEMA DRAINAGE Right 06/27/2014   Procedure: EMPYEMA DRAINAGE;  Surgeon: Grace Isaac, MD;  Location: [place];  Service: Thoracic;  Laterality: Right;   HEMORRHOID BANDING N/A 03/03/2015   Procedure: Thayer Jew;  Surgeon: Danie Binder, MD;  [facility];  Service: Endoscopy;  Laterality: N/A;  Recomended to see a Psychologist, sport and exercise.   HERNIA REPAIR     INTRAVASCULAR ULTRASOUND/IVUS N/A 09/27/2017   Procedure: Intravascular Ultrasound/IVUS;  Surgeon: Burnell Blanks, MD;  Location: [place] [facility];  Service: Cardiovascular;  Laterality: N/A;   LEFT HEART CATH AND CORONARY ANGIOGRAPHY N/A 09/27/2017   Procedure: LEFT HEART CATH AND CORONARY ANGIOGRAPHY;  Surgeon: Burnell Blanks, MD;  Location: [place] [facility];  Service: Cardiovascular;  Laterality: N/A;   LUNG SURGERY  2016   POLYPECTOMY  03/25/2019   Procedure: POLYPECTOMY;  Surgeon: Danie Binder, MD;  [facility];  Service: Endoscopy;;   TEE WITHOUT CARDIOVERSION N/A 07/14/2014   Procedure: TRANSESOPHAGEAL ECHOCARDIOGRAM (TEE);  Surgeon: Sueanne Margarita, MD;  Location: [place];  Service: Cardiovascular;  Laterality: N/A;   TRANSURETHRAL RESECTION OF BLADDER TUMOR N/A 06/16/2020    Procedure: TRANSURETHRAL RESECTION OF BLADDER TUMOR (TURBT) WITH GEMCITABINE;  Surgeon: [name], MD;  Location: [facility];  Service: Urology;  Laterality: N/A;   VARICOSE VEIN SURGERY     VIDEO ASSISTED THORACOSCOPY Right 06/27/2014   Procedure: VIDEO ASSISTED THORACOSCOPY;  Surgeon: Grace Isaac, MD;  Location: [place];  Service: Thoracic;  Laterality: Right;   VIDEO BRONCHOSCOPY N/A 06/27/2014   Procedure: VIDEO BRONCHOSCOPY;  Surgeon: Grace Isaac, MD;  [facility];  Service: Thoracic;  Laterality: N/A;     No Known Allergies    Family History  Problem Relation Age of Onset   Tuberculosis Father    Hypertension Father    COPD Mother  Hypertension Mother    Colon cancer Neg Hx      Social History Mr. Galindo reports that he quit smoking about 6 years ago. His smoking use included pipe and cigarettes. He has a 10.00 pack-year smoking history. He has never used smokeless tobacco. Mr. Jenniges reports that he does not currently use alcohol.   Review of Systems CONSTITUTIONAL: No weight loss, fever, chills, weakness or fatigue.  HEENT: Eyes: No visual loss, blurred vision, double vision or yellow sclerae.No hearing loss, sneezing, congestion, runny nose or sore throat.  SKIN: No rash or itching.  CARDIOVASCULAR: per hpi RESPIRATORY: No shortness of breath, cough or sputum.  GASTROINTESTINAL: No anorexia, nausea, vomiting or diarrhea. No abdominal pain or blood.  GENITOURINARY: No burning on urination, no polyuria NEUROLOGICAL: No headache, dizziness, syncope, paralysis, ataxia, numbness or tingling in the extremities. No change in bowel or bladder control.  MUSCULOSKELETAL: No muscle, back pain, joint pain or stiffness.  LYMPHATICS: No enlarged nodes. No history of splenectomy.  PSYCHIATRIC: No history of depression or anxiety.  ENDOCRINOLOGIC: No reports of sweating, cold or heat intolerance. No polyuria or polydipsia.  Marland Kitchen   Physical Examination Today's Vitals    04/23/21 0921  BP: 115/71  Pulse: 69  SpO2: 97%  Weight: 188 lb 6.4 oz (85.5 kg)  Height: 6' (1.829 m)   Body mass index is 25.55 kg/m.  Gen: resting comfortably, no acute distress HEENT: no scleral icterus, pupils equal round and reactive, no palptable cervical adenopathy,  CV: RRR, no m/r/g no jvd Resp: Clear to auscultation bilaterally GI: abdomen is soft, non-tender, non-distended, normal bowel sounds, no hepatosplenomegaly MSK: extremities are warm, no edema.  Skin: warm, no rash Neuro:  no focal deficits Psych: appropriate affect   Diagnostic Studies 09/2017 cath Prox RCA lesion is 30% stenosed. Prox LAD lesion is 100% stenosed. Prox Cx lesion is 20% stenosed. A drug-eluting stent was successfully placed using a STENT SYNERGY DES 3X16. Post intervention, there is a 0% residual stenosis. Ost 1st Diag lesion is 100% stenosed. Balloon angioplasty was performed using a BALLOON SAPPHIRE 2.5X12. Post intervention, there is a 40% residual stenosis. Prox LAD to Mid LAD lesion is 40% stenosed.   1. Acute anterior STEMI secondary to occlusion of the mid LAD 2. Successful PTCA/DES x 1 mid LAD 3. Successful PTCA/angioplasty ostium of the Diagonal [place].  4. Mild non-obstructive disease in the RCA, circumflex and mid LAD   Post cath Recommendations:  Will admit to the ICU and continue Aggrastat for 6 hours. Will start high intensity statin and a beta blocker. Echo later today.   . Recommend uninterrupted dual antiplatelet therapy with Aspirin 81mg  daily and Ticagrelor 90mg  twice daily for a minimum of 12 months (ACS - Class I recommendation).      09/2017 echo Study Conclusions   - Left ventricle: The cavity size was normal. There was mild focal   basal hypertrophy of the septum. Systolic function was moderately   to severely reduced. The estimated ejection fraction was in the   range of 30% to 35%. Severe hypokinesis of the mid to apical   anteroseptal, anterior, and  anterolateral myocardium. Dyskinetic   at apex. Doppler parameters are consistent with abnormal left   ventricular relaxation (grade 1 diastolic dysfunction). - Aortic valve: Trileaflet; mildly thickened, mildly calcified   leaflets. - Mitral valve: Calcified annulus. There was trivial regurgitation. - Pulmonary arteries: PA peak pressure: 41 mm Hg (S). - Pericardium, extracardiac: A small pericardial effusion was  identified.   Impressions:   - New reduction in LV EF with regional wall motion abnormalities in   the mid to apical anteroseptum, anterior, and anterolateral walls   with dyskinesis at the apex. Small pericardial effusion noted.   10/2017 echo Study Conclusions   - Left ventricle: The cavity size was normal. Wall thickness was   increased in a pattern of moderate LVH. Systolic function was   moderately reduced. The estimated ejection fraction was in the   range of 35% to 40%. Doppler parameters are consistent with   abnormal left ventricular relaxation (grade 1 diastolic   dysfunction). Doppler parameters are consistent with high   ventricular filling pressure. - Regional wall motion abnormality: Hypokinesis of the apical   septal, apical lateral, and apical myocardium. - Aortic valve: Mildly calcified annulus. Trileaflet; mildly   thickened leaflets. Valve area (VTI): 4.91 cm^2. Valve area   (Vmax): 4.27 cm^2. Valve area (Vmean): 3.54 cm^2. - Mitral valve: Mildly calcified annulus. Mildly thickened leaflets   . - Left atrium: The atrium was moderately dilated. - Pericardium, extracardiac: There is a small circumferential   pericardial effusion. The effusion measures 0.8 cm adjacent to   the LV. There is no evidence of tamponade physiology. - Technically adequate study.     02/2018 heart monitor 21 day event monitor Min HR 48, Max HR 130, Avg HR 71. Min HR occurred in early AM hours presumably while sleeping No symptoms reported 4 beat run of NSVT that was  asymptomatic     02/2019 echo   IMPRESSIONS      1. Left ventricular ejection fraction, by visual estimation, is 40 to 45%. The left ventricle has mildly decreased function. There is moderately increased left ventricular hypertrophy.  2. Elevated left atrial pressure.  3. Left ventricular diastolic parameters are consistent with Grade I diastolic dysfunction (impaired relaxation).  4. Hypokinesis of the apex, mid to distal anteroseptal, apical lateral, and apical myocardium.  5. Global right ventricle has normal systolic function.The right ventricular size is normal. No increase in right ventricular wall thickness.  6. Left atrial size was moderately dilated.  7. Right atrial size was normal.  8. The mitral valve is normal in structure. Mild mitral valve regurgitation. No evidence of mitral stenosis.  9. The tricuspid valve is normal in structure. Tricuspid valve regurgitation is not demonstrated. 10. The aortic valve is tricuspid. Aortic valve regurgitation is not visualized. No evidence of aortic valve sclerosis or stenosis. 11. The pulmonic valve was not well visualized. Pulmonic valve regurgitation is not visualized. 12. The inferior vena cava is normal in size with greater than 50% respiratory variability, suggesting right atrial pressure of 3 mmHg.   10/2020 echo 1. Left ventricular ejection fraction, by estimation, is 40 to 45%. The  left ventricle has mildly decreased function. The left ventricle  demonstrates regional wall motion abnormalities (see scoring  diagram/findings for description). There is mild left  ventricular hypertrophy. Left ventricular diastolic parameters are  consistent with Grade I diastolic dysfunction (impaired relaxation).   2. Right ventricular systolic function is normal. The right ventricular  size is normal. Tricuspid regurgitation signal is inadequate for assessing  PA pressure.   3. Left atrial size was mildly dilated.   4. The mitral valve is  degenerative. Mild mitral valve regurgitation. The  mean mitral valve gradient is 1.0 mmHg.   5. The aortic valve is tricuspid. There is mild calcification of the  aortic valve. Aortic valve regurgitation is not visualized. Mild  aortic  valve sclerosis is present, with no evidence of aortic valve stenosis.  Aortic valve mean gradient measures 3.0  mmHg.   6. The inferior vena cava is normal in size with greater than 50%  respiratory variability, suggesting right atrial pressure of 3 mmHg.       Assessment and Plan  1. CAD/ICM/chronic systolic HF - medical therapy previously limited by prior renal dysfunction, soft bp's however both issues have resolved.  - repeat bmet/mg. If renal function stable would continue entresto and start aldactone 12.5mg  daily. If further decline in renal function may chagne entersto back to losartan.     2. Hyperlipidemia - he is at goal, continue crestor   F/u 3 months      Arnoldo Lenis, M.D.

## 2021-04-26 ENCOUNTER — Telehealth: Payer: Self-pay

## 2021-04-26 MED ORDER — SPIRONOLACTONE 25 MG PO TABS: 12.5000 mg | ORAL_TABLET | Freq: Every day | ORAL | 3 refills | Status: DC

## 2021-04-26 NOTE — Telephone Encounter (Signed)
-----   Message from Arnoldo Lenis, MD sent at 04/25/2021  8:01 AM EST ----- Labs overall look good. Kidney function shows some mild improvement from last check. Can he start aldactone 12.5mg  daily. We will see how his labs with pcp look in March  J Branch MD

## 2021-04-26 NOTE — Telephone Encounter (Signed)
Patient notified and verbalized understanding. Pt had no other questions or concerns at this time. Pt agreeable with medication.

## 2021-04-27 ENCOUNTER — Ambulatory Visit (INDEPENDENT_AMBULATORY_CARE_PROVIDER_SITE_OTHER): Payer: Medicare Other | Admitting: Orthopaedic Surgery

## 2021-04-27 ENCOUNTER — Ambulatory Visit: Payer: Medicare Other

## 2021-04-27 ENCOUNTER — Other Ambulatory Visit: Payer: Self-pay

## 2021-04-27 ENCOUNTER — Encounter: Payer: Self-pay | Admitting: Orthopaedic Surgery

## 2021-04-27 VITALS — BP 140/68 | HR 70 | Ht 72.0 in | Wt 189.0 lb

## 2021-04-27 DIAGNOSIS — M25511 Pain in right shoulder: Secondary | ICD-10-CM | POA: Diagnosis not present

## 2021-04-27 DIAGNOSIS — G8929 Other chronic pain: Secondary | ICD-10-CM

## 2021-04-27 DIAGNOSIS — I2510 Atherosclerotic heart disease of native coronary artery without angina pectoris: Secondary | ICD-10-CM | POA: Diagnosis not present

## 2021-04-27 DIAGNOSIS — M25512 Pain in left shoulder: Secondary | ICD-10-CM | POA: Diagnosis not present

## 2021-04-27 DIAGNOSIS — H6123 Impacted cerumen, bilateral: Secondary | ICD-10-CM | POA: Diagnosis not present

## 2021-04-27 NOTE — Progress Notes (Signed)
Subjective:    Patient ID: [name] [name], male    DOB: Nov 13, 1945, 76 y.o.   MRN: [id number]  HPI He had a fall in June of 2022 falling on his right upper back and right shoulder.  His right shoulder has been hurting on and off since then.  He also has pain of the left shoulder, hands, wrists and posterior thighs.  He cannot take NSAIDs secondary to potential kidney issues.  He has tried Biofreeze and Voltaren Gel.  The Biofreeze does better.  He has pain more in the right than the left shoulder.  He has no numbness, no swelling, no redness.  He has no neck pain.  Colder weather makes it worse.  Pain is worse in the hands during the morning hours.  He takes Tylenol and uses ice or heat which help some.  He is not getting any better.   Review of Systems  Constitutional:  Positive for activity change.  Respiratory:  Positive for shortness of breath.   Cardiovascular:  Positive for chest pain and palpitations.  Musculoskeletal:  Positive for arthralgias, back pain and myalgias.  All other systems reviewed and are negative. For Review of Systems, all other systems reviewed and are negative.  The following is a summary of the past history medically, past history surgically, known current medicines, social history and family history.  This information is gathered electronically by the computer from prior information and documentation.  I review this each visit and have found including this information at this point in the chart is beneficial and informative.   Past Medical History:  Diagnosis Date   Acute systolic heart failure (HCC)    Anxiety    COPD (chronic obstructive pulmonary disease) (HCC)    Enlarged prostate with lower urinary tract symptoms (LUTS)    GERD (gastroesophageal reflux disease)    Hyperlipidemia    Hypertension    Low serum testosterone level    PAF (paroxysmal atrial fibrillation) ([place])    Pneumonia 05/2014   Pyothorax without fistula (HCC)    Sciatica    Shortness  of breath dyspnea    Solitary pulmonary nodule    STEMI (ST elevation myocardial infarction) ([place])    09/27/17 PCI/DES x1 mLAD, PTCA of the diag branch. EF 30% Lifevest at discharge.    Testicular hypofunction    Urinary frequency     Past Surgical History:  Procedure Laterality Date   APPENDECTOMY     BACK SURGERY     COLONOSCOPY  2011   Tubular adenoma per patient   COLONOSCOPY N/A 03/03/2015   Dr. Oneida Alar: Left colon is redundant, mild diverticulosis, small internal hemorrhoids, moderate external hemorrhoids.  Next colonoscopy in 5 years with overtube.   COLONOSCOPY N/A 03/25/2019   Procedure: COLONOSCOPY;  Surgeon: Danie Binder, MD;  [facility];  Service: Endoscopy;  Laterality: N/A;  8:30am - Camille's request   CORONARY/GRAFT ACUTE MI REVASCULARIZATION N/A 09/27/2017   Procedure: Coronary/Graft Acute MI Revascularization;  Surgeon: Burnell Blanks, MD;  Location: [facility];  Service: Cardiovascular;  Laterality: N/A;   CYSTOSCOPY W/ RETROGRADES Bilateral 06/16/2020   Procedure: CYSTOSCOPY WITH RETROGRADE PYELOGRAM;  Surgeon: [name], MD;  Location: [facility];  Service: Urology;  Laterality: Bilateral;   EMPYEMA DRAINAGE Right 06/27/2014   Procedure: EMPYEMA DRAINAGE;  Surgeon: Grace Isaac, MD;  Location: [place];  Service: Thoracic;  Laterality: Right;   HEMORRHOID BANDING N/A 03/03/2015   Procedure: Thayer Jew;  Surgeon:  Danie Binder, MD;  [facility];  Service: Endoscopy;  Laterality: N/A;  Recomended to see a Psychologist, sport and exercise.   HERNIA REPAIR     INTRAVASCULAR ULTRASOUND/IVUS N/A 09/27/2017   Procedure: Intravascular Ultrasound/IVUS;  Surgeon: Burnell Blanks, MD;  Location: [facility];  Service: Cardiovascular;  Laterality: N/A;   LEFT HEART CATH AND CORONARY ANGIOGRAPHY N/A 09/27/2017   Procedure: LEFT HEART CATH AND CORONARY ANGIOGRAPHY;  Surgeon: Burnell Blanks, MD;  Location: [place] [facility];   Service: Cardiovascular;  Laterality: N/A;   LUNG SURGERY  2016   POLYPECTOMY  03/25/2019   Procedure: POLYPECTOMY;  Surgeon: Danie Binder, MD;  [facility];  Service: Endoscopy;;   TEE WITHOUT CARDIOVERSION N/A 07/14/2014   Procedure: TRANSESOPHAGEAL ECHOCARDIOGRAM (TEE);  Surgeon: Sueanne Margarita, MD;  Location: [place];  Service: Cardiovascular;  Laterality: N/A;   TRANSURETHRAL RESECTION OF BLADDER TUMOR N/A 06/16/2020   Procedure: TRANSURETHRAL RESECTION OF BLADDER TUMOR (TURBT) WITH GEMCITABINE;  Surgeon: [name], MD;  Location: [facility];  Service: Urology;  Laterality: N/A;   VARICOSE VEIN SURGERY     VIDEO ASSISTED THORACOSCOPY Right 06/27/2014   Procedure: VIDEO ASSISTED THORACOSCOPY;  Surgeon: Grace Isaac, MD;  Location: [place];  Service: Thoracic;  Laterality: Right;   VIDEO BRONCHOSCOPY N/A 06/27/2014   Procedure: VIDEO BRONCHOSCOPY;  Surgeon: Grace Isaac, MD;  Location[facility];  Service: Thoracic;  Laterality: N/A;    Current Outpatient Medications on File Prior to Visit  Medication Sig Dispense Refill   acetaminophen (TYLENOL) 500 MG tablet Take 500 mg by mouth every 6 (six) hours as needed for moderate pain.     aspirin EC 81 MG tablet Take 1 tablet (81 mg total) by mouth daily. 90 tablet 3   Calcium Citrate-Vitamin D (CALCIUM CITRATE + D3 PO) Take 1 tablet by mouth daily.     Carboxymethylcellul-Glycerin (LUBRICATING EYE DROPS OP) Place 1 drop into both eyes 2 (two) times daily. As needed     carvedilol (COREG) 6.25 MG tablet TAKE 1 TABLET(6.25 MG) BY MOUTH TWICE DAILY WITH A MEAL 180 tablet 3   dapagliflozin propanediol (FARXIGA) 10 MG TABS tablet Take 1 tablet (10 mg total) by mouth daily. 90 tablet 3   Multiple Vitamins-Minerals (SENIOR VITES PO) Take 1 tablet by mouth daily.     nitroGLYCERIN (NITROSTAT) 0.4 MG SL tablet Place 1 tablet (0.4 mg total) under the tongue every 5 (five) minutes as needed. 25 tablet 2   NON FORMULARY Fluc2% toe  nail drops--apply topically once daily for toenail fungus     rosuvastatin (CRESTOR) 40 MG tablet TAKE 1 TABLET(40 MG) BY MOUTH DAILY 90 tablet 3   sacubitril-valsartan (ENTRESTO) 24-26 MG Take 1 tablet by mouth 2 (two) times daily. 180 tablet 3   spironolactone (ALDACTONE) 25 MG tablet Take 0.5 tablets (12.5 mg total) by mouth daily. 45 tablet 3   tamsulosin (FLOMAX) 0.4 MG CAPS capsule Take 1 capsule (0.4 mg total) by mouth daily. 30 capsule 2   No current facility-administered medications on file prior to visit.    Social History   Socioeconomic History   Marital status: Married    Spouse name: Not on file   Number of children: Not on file   Years of education: Not on file   Highest education level: Not on file  Occupational History   Not on file  Tobacco Use   Smoking status: Former    Packs/day: 1.00  Years: 10.00    Pack years: 10.00    Types: Pipe, Cigarettes    Quit date: 06/07/2014    Years since quitting: 6.8   Smokeless tobacco: Never   Tobacco comments:    smoked pipe for 40 years, daily  Vaping Use   Vaping Use: Never used  Substance and Sexual Activity   Alcohol use: Not Currently    Comment: Average 1-2 drinks of beer in the evening; 04/29/20 none since 01/2020   Drug use: No   Sexual activity: Not on file  Other Topics Concern   Not on file  Social History Narrative   Not on file   Social Determinants of Health   Financial Resource Strain: Not on file  Food Insecurity: Not on file  Transportation Needs: Not on file  Physical Activity: Not on file  Stress: Not on file  Social Connections: Not on file  Intimate Partner Violence: Not on file    Family History  Problem Relation Age of Onset   Tuberculosis Father    Hypertension Father    COPD Mother    Hypertension Mother    Colon cancer Neg Hx     BP 140/68    Pulse 70    Ht 6' (1.829 m)    Wt 189 lb (85.7 kg)    BMI 25.63 kg/m   Body mass index is 25.63 kg/m.     Objective:   Physical  Exam Vitals and nursing note reviewed. Exam conducted with a chaperone present.  Constitutional:      Appearance: He is well-developed.  HENT:     Head: Normocephalic and atraumatic.  Eyes:     Conjunctiva/sclera: Conjunctivae normal.     Pupils: Pupils are equal, round, and reactive to light.  Cardiovascular:     Rate and Rhythm: Normal rate and regular rhythm.  Pulmonary:     Effort: Pulmonary effort is normal.  Abdominal:     Palpations: Abdomen is soft.  Musculoskeletal:       Arms:     Cervical back: Normal range of motion and neck supple.  Skin:    General: Skin is warm and dry.  Neurological:     Mental Status: He is alert and oriented to person, place, and time.     Cranial Nerves: No cranial nerve deficit.     Motor: No abnormal muscle tone.     Coordination: Coordination normal.     Deep Tendon Reflexes: Reflexes are normal and symmetric. Reflexes normal.  Psychiatric:        Behavior: Behavior normal.        Thought Content: Thought content normal.        Judgment: Judgment normal.  X-rays were done of both shoulders, reported separately.        Assessment & Plan:   Encounter Diagnoses  Name Primary?   Chronic right shoulder pain Yes   Chronic left shoulder pain    He also has arthralgias of both hands and wrist.  I have recommended use of paraffin bath for hands.  I have recommended continued use of the Biofreeze.  Use the voltaren gel more for the fingers.  Use heat or ice to thighs and shoulder as needed.  PROCEDURE NOTE:  The patient request injection, verbal consent was obtained.  The left shoulder was prepped appropriately after time out was performed.   Sterile technique was observed and injection of 1 cc of DepoMedrol 40mg  with several cc's of plain xylocaine. Anesthesia was provided by  ethyl chloride and a 20-gauge needle was used to inject the shoulder area. A posterior approach was used.  The injection was tolerated well.  A band aid  dressing was applied.  The patient was advised to apply ice later today and tomorrow to the injection sight as needed.  PROCEDURE NOTE:  The patient request injection, verbal consent was obtained.  The right shoulder was prepped appropriately after time out was performed.   Sterile technique was observed and injection of 1 cc of DepoMedrol 40mg  with several cc's of plain xylocaine. Anesthesia was provided by ethyl chloride and a 20-gauge needle was used to inject the shoulder area. A posterior approach was used.  The injection was tolerated well.  A band aid dressing was applied.  The patient was advised to apply ice later today and tomorrow to the injection sight as needed.  Return in one month.  He may need MRI.  He may need PT.  Continue the Tylenol as needed.  Call if any problem.  Precautions discussed.  Electronically Signed Sanjuana Kava, MD 2/7/202311:26 AM

## 2021-04-27 NOTE — Patient Instructions (Addendum)
Use  Biofreeze (as often as you want) on shoulders or Voltaren gel hands over the counter 2-3 times daily make sure you rub it in well each time you use it.    Paraffin wax (bath) may be helpful for your hand pain, Lovey Newcomer has one and it is helpful, you can buy the wax and warmer at [facility]  or [employer] your shoulders after the injections today for 30 minutes this evening for 30 minutes then again tomorrow if they are sore   Shoulder Impingement Syndrome (Bursitis) Shoulder impingement syndrome is a condition that causes pain when connective tissues (tendons) surrounding the shoulder joint become pinched. These tendons are part of the group of muscles and tissues that help to stabilize the shoulder (rotator cuff). Beneath the rotator cuff is a fluid-filled sac (bursa) that allows the muscles and tendons to glide smoothly. The bursa may become swollen or irritated (bursitis). Bursitis, swelling in the rotator cuff tendons, or both conditions can decrease how much space is under a bone in the shoulder joint (acromion), resulting in impingement. What are the causes? Shoulder impingement syndrome may be caused by bursitis or swelling of the rotator cuff tendons, which may result from: Repetitive overhead arm movements. Falling onto the shoulder. Weakness in the shoulder muscles. What increases the risk? You may be more likely to develop this condition if you: Play sports that involve throwing, such as baseball. Participate in sports such as tennis, volleyball, and swimming. Work as a Curator, Games developer, or Architect. Some people are also more likely to develop impingement syndrome because of the shape of their acromion bone. What are the signs or symptoms? The main symptom of this condition is pain on the front or side of the shoulder. The pain may: Get worse when lifting or raising the arm. Get worse at night. Wake you up from sleeping. Feel sharp when the shoulder is moved and then fade  to an ache. Other symptoms may include: Tenderness. Stiffness. Inability to raise the arm above shoulder level or behind the body. Weakness. How is this diagnosed? This condition may be diagnosed based on: Your symptoms and medical history. A physical exam. Imaging tests, such as: X-rays. MRI. Ultrasound. How is this treated? This condition may be treated by: Resting your shoulder and avoiding all activities that cause pain or put stress on the shoulder. Icing your shoulder. NSAIDs to help reduce pain and swelling. One or more injections of medicines to numb the area and reduce inflammation. Physical therapy. Surgery. This may be needed if nonsurgical treatments have not helped. Surgery may involve repairing the rotator cuff, reshaping the acromion, or removing the bursa. Follow these instructions at home: Managing pain, stiffness, and swelling  If directed, put ice on the injured area. Put ice in a plastic bag. Place a towel between your skin and the bag. Leave the ice on for 20 minutes, 2-3 times a day. Activity Rest and return to your normal activities as told by your health care provider. Ask your health care provider what activities are safe for you. Do exercises as told by your health care provider. General instructions Do not use any products that contain nicotine or tobacco, such as cigarettes, e-cigarettes, and chewing tobacco. These can delay healing. If you need help quitting, ask your health care provider. Ask your health care provider when it is safe for you to drive. Take over-the-counter and prescription medicines only as told by your health care provider. Keep all follow-up visits as  told by your health care provider. This is important. How is this prevented? Give your body time to rest between periods of activity. Be safe and responsible while being active. This will help you avoid falls. Maintain physical fitness, including strength and flexibility. Contact  a health care provider if: Your symptoms have not improved after 1-2 months of treatment and rest. You cannot lift your arm away from your body. Summary Shoulder impingement syndrome is a condition that causes pain when connective tissues (tendons) surrounding the shoulder joint become pinched. The main symptom of this condition is pain on the front or side of the shoulder. This condition is usually treated with rest, ice, and pain medicines as needed. This information is not intended to replace advice given to you by your health care provider. Make sure you discuss any questions you have with your health care provider. Document Revised: 06/29/2018 Document Reviewed: 08/30/2017 Elsevier Patient Education  2022 [employer].

## 2021-05-06 DIAGNOSIS — B351 Tinea unguium: Secondary | ICD-10-CM | POA: Diagnosis not present

## 2021-05-25 ENCOUNTER — Other Ambulatory Visit: Payer: Self-pay

## 2021-05-25 ENCOUNTER — Ambulatory Visit (INDEPENDENT_AMBULATORY_CARE_PROVIDER_SITE_OTHER): Payer: Medicare Other | Admitting: Orthopaedic Surgery

## 2021-05-25 ENCOUNTER — Encounter: Payer: Self-pay | Admitting: Orthopaedic Surgery

## 2021-05-25 VITALS — BP 104/51 | HR 71 | Ht 72.0 in | Wt 186.0 lb

## 2021-05-25 DIAGNOSIS — I2510 Atherosclerotic heart disease of native coronary artery without angina pectoris: Secondary | ICD-10-CM | POA: Diagnosis not present

## 2021-05-25 DIAGNOSIS — G8929 Other chronic pain: Secondary | ICD-10-CM | POA: Diagnosis not present

## 2021-05-25 DIAGNOSIS — M25512 Pain in left shoulder: Secondary | ICD-10-CM

## 2021-05-25 DIAGNOSIS — M25511 Pain in right shoulder: Secondary | ICD-10-CM

## 2021-05-25 NOTE — Progress Notes (Signed)
I am fine ? ?He has no pain of either shoulder.  The injections really helped. ? ?He has full ROM of both shoulders.  NV intact. ? ?Encounter Diagnoses  ?Name Primary?  ? Chronic right shoulder pain Yes  ? Chronic left shoulder pain   ? ?I will see as needed. ? ?Call if any problem. ? ?Precautions discussed. ? ?Electronically Signed ?Sanjuana Kava, MD ?3/7/20238:06 AM ? ?

## 2021-05-26 DIAGNOSIS — Z1283 Encounter for screening for malignant neoplasm of skin: Secondary | ICD-10-CM | POA: Diagnosis not present

## 2021-05-26 DIAGNOSIS — B078 Other viral warts: Secondary | ICD-10-CM | POA: Diagnosis not present

## 2021-05-26 DIAGNOSIS — D0439 Carcinoma in situ of skin of other parts of face: Secondary | ICD-10-CM | POA: Diagnosis not present

## 2021-05-26 DIAGNOSIS — D225 Melanocytic nevi of trunk: Secondary | ICD-10-CM | POA: Diagnosis not present

## 2021-06-09 DIAGNOSIS — I5022 Chronic systolic (congestive) heart failure: Secondary | ICD-10-CM | POA: Diagnosis not present

## 2021-06-09 DIAGNOSIS — R7303 Prediabetes: Secondary | ICD-10-CM | POA: Diagnosis not present

## 2021-06-09 DIAGNOSIS — Z125 Encounter for screening for malignant neoplasm of prostate: Secondary | ICD-10-CM | POA: Diagnosis not present

## 2021-06-10 ENCOUNTER — Encounter: Payer: Self-pay | Admitting: Urology

## 2021-06-10 DIAGNOSIS — Z20822 Contact with and (suspected) exposure to covid-19: Secondary | ICD-10-CM | POA: Diagnosis not present

## 2021-06-16 DIAGNOSIS — K5909 Other constipation: Secondary | ICD-10-CM | POA: Diagnosis not present

## 2021-06-16 DIAGNOSIS — K4090 Unilateral inguinal hernia, without obstruction or gangrene, not specified as recurrent: Secondary | ICD-10-CM | POA: Diagnosis not present

## 2021-06-16 DIAGNOSIS — R0600 Dyspnea, unspecified: Secondary | ICD-10-CM | POA: Diagnosis not present

## 2021-06-16 DIAGNOSIS — J449 Chronic obstructive pulmonary disease, unspecified: Secondary | ICD-10-CM | POA: Diagnosis not present

## 2021-06-16 DIAGNOSIS — R7301 Impaired fasting glucose: Secondary | ICD-10-CM | POA: Diagnosis not present

## 2021-06-16 DIAGNOSIS — I5022 Chronic systolic (congestive) heart failure: Secondary | ICD-10-CM | POA: Diagnosis not present

## 2021-06-16 DIAGNOSIS — C679 Malignant neoplasm of bladder, unspecified: Secondary | ICD-10-CM | POA: Diagnosis not present

## 2021-06-16 DIAGNOSIS — R634 Abnormal weight loss: Secondary | ICD-10-CM | POA: Diagnosis not present

## 2021-06-16 DIAGNOSIS — I2510 Atherosclerotic heart disease of native coronary artery without angina pectoris: Secondary | ICD-10-CM | POA: Diagnosis not present

## 2021-06-16 DIAGNOSIS — F331 Major depressive disorder, recurrent, moderate: Secondary | ICD-10-CM | POA: Diagnosis not present

## 2021-06-16 DIAGNOSIS — R053 Chronic cough: Secondary | ICD-10-CM | POA: Diagnosis not present

## 2021-06-16 DIAGNOSIS — F5105 Insomnia due to other mental disorder: Secondary | ICD-10-CM | POA: Diagnosis not present

## 2021-07-05 ENCOUNTER — Ambulatory Visit (INDEPENDENT_AMBULATORY_CARE_PROVIDER_SITE_OTHER): Payer: Medicare Other | Admitting: Internal Medicine

## 2021-07-05 ENCOUNTER — Encounter: Payer: Self-pay | Admitting: Internal Medicine

## 2021-07-05 VITALS — BP 118/60 | HR 58 | Temp 98.7°F | Ht 72.0 in | Wt 184.6 lb

## 2021-07-05 DIAGNOSIS — I2510 Atherosclerotic heart disease of native coronary artery without angina pectoris: Secondary | ICD-10-CM

## 2021-07-05 DIAGNOSIS — R053 Chronic cough: Secondary | ICD-10-CM

## 2021-07-05 DIAGNOSIS — J449 Chronic obstructive pulmonary disease, unspecified: Secondary | ICD-10-CM

## 2021-07-05 DIAGNOSIS — J479 Bronchiectasis, uncomplicated: Secondary | ICD-10-CM | POA: Diagnosis not present

## 2021-07-05 MED ORDER — AZITHROMYCIN 250 MG PO TABS: ORAL_TABLET | ORAL | 11 refills | Status: DC

## 2021-07-05 NOTE — Patient Instructions (Addendum)
I will be referring you for lung cancer screening  - it actually isn't due until 03/11/22  ? ?The sacubitrilat component of entresto is not and ACEi but it does lead to higher levels of bradykinin (the culprit in ACEi related cough) because it reduces Neprilysin based clearance of bradykinin.The typical symptoms are dry daytime cough (9% per PI) or complaints of a new sensation of something stuck in throat  excess PNDS.  ? ? ?Refill zpak  - should turn mucus clear or less dark ? ?Max dose guaifensin is 1200 mg every 12 hours and use the flutter valve if needed  ? ?Please schedule a follow up visit in 6  months but call sooner if needed ?

## 2021-07-05 NOTE — Progress Notes (Signed)
? ?[name] [name], male    DOB: 1945-06-20    MRN: [id number] ? ? 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 ? ? ?History of Present Illness  ?05/21/2019  Pulmonary/ 1st [name] eval/[name]  ?Chief Complaint  ?Patient presents with  ? Pulmonary Consult  ?  Referred by Dr. [name] for eval of COPD. Former Dr [name] pt. He is maintained on spiriva and does not use a rescue inhaler. He states his breathing is doing well currently.   ? Dyspnea:  3 x weekly treadmill x 50 min at 3.6 -4 and 4.5  ?Cough: comes and goes x years variably productive not noct  ?Sleep: does fine in recliner / can do flat in bed  ?SABA use: none  ?rec ?Try off spiriva to see if you notice any difference in your exercise tolerance or cough ?For cough > mucinex dm up to 1200 mg every 12 hours as needed  ?Please schedule a follow up visit in 2 months at [name] with PFT's first if possible ? ?  ? ? ?12/13/2019  f/u ov/[name] [name] re:  GOLD II barely / maint on bevespi and off lisinopril  08/13/19 ?Chief Complaint  ?Patient presents with  ? Follow-up  ?  productive cough with thick, yellow phlegm  ?Dyspnea:  Treadmill x up to 55 min 4pm x 4%  ?Cough: ever since empyema surgery but changed early September 2021 better p zpak ?Sleeping: in recliner due to back / minimal cough  ?SABA use: none  ?02: none  ?rec ?Breztri Take 2 puffs first thing in am and then another 2 puffs about 12 hours later x 2 weeks and stop and then restart bevespi only if notice deterioration ?Pantoprazole (protonix) 40 mg   Take  30-60 min before first meal of the day and Pepcid (famotidine)  20 mg one after  Supper until return to [name]  ?GERD diet  ?Please remember to go to the lab and x-ray department at [facility]   for your tests - we will call you with the results  when they are available. ?Please schedule a follow up [name] visit in 6 weeks, call sooner if needed  ? ? ?01/21/2020  f/u ov/[name] [name] re: COPD GOLD II /  cough x 2016 when dx with R empyema  ?Chief Complaint  ?Patient presents with  ? Follow-up  ?  productive cough with pinkish color phlegm  ?Dyspnea:  No change ex tol treadmill on or off breztri vs bevespi vs nothin  ?Cough: actually better while on treadmill/ still pink tinged variably on baby asa daily  ?Sleeping: worse on L side down  ?SABA use: none  ?02: none  ?rec ?For changed mucus from your normal go ahead and use zpak  ?If mucus gets really bloody, stop the baby aspirin until better  ?Ok to stop the reflux meds  - you may need to take pepcid 20 mg twice daily x one week then one daily x week and stop  ? schedule your CT chest and sinus and the results > bronchiectasis/ no sinusitis ? ?  ? ? ?04/27/2020  f/u ov/[place] [name]/[name] [name] re: GOLD II/ bronchiectasis  ?Chief Complaint  ?Patient presents with  ? Follow-up  ?  Shortness of breath with activity, productive cough with  yellow phlegm  ?Dyspnea:  Hardly ever doing the treadmill / uphill to mailbox and back 7-8 min sob/tired ?Cough: less productive,  More irritative ?Sleeping: L side down makes same as usual / 10 degrees bed blocks  ?SABA use: has not tried albuterol  ?02: none  ?Covid status: triple vax  ?Lung cancer screening: just had hrct  ?rec ?Ok to try  albuterol (or bztri) 15 min before an activity  ?Bronchiectasis =   you have scarring of your bronchial tubes ?Make sure you check your oxygen saturation at your highest level of activity to be sure it stays over 90%   ? ? ?05/15/2020  f/u ov/[name] re: copd II/ bronchiectasis ?Chief Complaint  ?Patient presents with  ? Follow-up  ?  "its been more difficult to cough" Productive cough with yellow phlegm  ? rx mucinex dm in minimal doses  ?Dyspnea:  mb and back and 10 min about the same before or after saba ?Cough: worse in evening not sure  before or after supper   ?Sleeping: better on bed blocks / worse on L side, sleeps p meds but then once stands up  In am   starts coughing x  1 hour ?SABA use: rarely use now, not sure it helps  ?02: none  ?Covid status: vax x 3  ?rec ?For cough/congestion > mucinex dm  Up to 1200 mg every 12 hours as needed and add flutter  ?For nasty mucus > zpak  ?Try prilosec otc '20mg'$   Take 30-60 min before first meal of the day and Pepcid ac (famotidine) 20 mg one after  Until return  ?Work on inhaler technique:  ?Only use your albuterol as a rescue medication   ?Ok to continue to Try albuterol 15 min before an activity that you know would make you short of breath     ? ? ?06/23/2020  f/u ov/[place] [name]/[name] [name] re: copd II/ bronchiectasis on flutter /mucinex/ gerd rx  ?Chief Complaint  ?Patient presents with  ? Follow-up  ?  Breathing is overall doing well and his cough has improved some. He still has some prod cough with yellow sputum. He has not needed his albuterol inhaler.  ?Dyspnea:  tol more activity  ?Cough: slt yellow worse in am = baseline  ?Sleeping: bed blocks  ?SABA use: none ?02:none  ?Covid status: vax x 3  - 4th on the way  ?Chest tightness better p gerd rx  ?Rec ?After 3 months try off prilosec and replace it and replace  pepcid taken after bfast and continue after supper x 2 weeks then after that drop off the am pepcid and 2 weeks after than stop the pm pepcid > if tightness flares then see GI doctor  ?Make sure the flutter valve flutters on expiration as much you want  ?For cough / congestion the maximum dose of mucinex or mucinex dm is 2400 mg in 24 hours  ? ? ?12/29/2020  f/u ov/[place] [name]/[name] [name] re: GOLD 2 copd/bronchiectasis  maint on no meds/ off gerd rx, no more chest tightness.   ?Chief Complaint  ?Patient presents with  ? Follow-up  ?  Sob comes and goes per patient. cough was worse over the summer but starting in September sees improvement. Coughs up yellow mucus.   ?Dyspnea:  treadmill x 45 min  3 x per week at 3.8 mph and include 4  s chest tightess  ?Cough: slt yellow typical / worse in am > improved on bactrim = zpak  ?Sleeping: bed  blocks L side down more cough  ?SABA use: none did not help ?02: none  ?Covid status: vax x 5  ?Lung cancer screening: HRCT yearly end of nov 2022 due ?Mucinex dm takes less now  ?Rec ?No change in medications  - if zpak does not clear up the nasty mucus > call for for BACTRIM  = sulfa drug  ? ? ? ?07/05/2021  f/u ov/[place] [name]/Elinora Weigand re: GOLD 2/ bronchiectasis maint on zpak prn   ?Chief Complaint  ?Patient presents with  ? Follow-up  ?  SOB and cough are about the same since last ov.  ? wants refill for z pak  ?Dyspnea:  treadmill same tol/sats  ?Cough: sporadic but this winter was the worst p entresto started / been with g dm but not on max doses / lots of am am mucus typically  ?Sleeping: bed blocks fine ?SABA use: none  ?02: none  ?Covid status: vax x 5  ?Lung cancer screening: not sure he's eligible ? ? ?No obvious day to day or daytime variability or assoc excess/ purulent sputum or mucus plugs or hemoptysis or cp or chest tightness, subjective wheeze or overt sinus or hb symptoms.  ? ?Sleeping  without nocturnal  exacerbation  of respiratory  c/o's or need for noct saba. Also denies any obvious fluctuation of symptoms with weather or environmental changes or other aggravating or alleviating factors except as outlined above  ? ?No unusual exposure hx or h/o childhood pna/ asthma or knowledge of premature birth. ? ?Current Allergies, Complete Past Medical History, Past Surgical History, Family History, and Social History were reviewed in [employer] record. ? ?ROS  The following are not active complaints unless bolded ?Hoarseness, sore throat, dysphagia, dental problems, itching, sneezing,  nasal congestion or discharge of excess mucus or purulent secretions, ear ache,   fever, chills, sweats, unintended wt loss or wt gain, classically  pleuritic or exertional cp,  orthopnea pnd or arm/hand swelling  or leg swelling, presyncope, palpitations, abdominal pain, anorexia, nausea, vomiting, diarrhea  or change in bowel habits or change in bladd

## 2021-07-06 ENCOUNTER — Encounter: Payer: Self-pay | Admitting: Internal Medicine

## 2021-07-06 DIAGNOSIS — Z08 Encounter for follow-up examination after completed treatment for malignant neoplasm: Secondary | ICD-10-CM | POA: Diagnosis not present

## 2021-07-06 DIAGNOSIS — Z85828 Personal history of other malignant neoplasm of skin: Secondary | ICD-10-CM | POA: Diagnosis not present

## 2021-07-06 NOTE — Assessment & Plan Note (Signed)
MM/Quit all smoking 2016 with dx of R empyema with chronic cough since then ?- Try off spiriva 05/21/2019  ?- PFT's  08/06/19  FEV1 2.60 (75 % ) ratio 0.64  p 14 % improvement from saba p 0 prior to study with DLCO  18.09 (66%) corrects to 3.14 (79%)  for alv volume and FV curve minimally concave  ?-  04/27/2020   Walked RA  approx   600 ft  @ fast pace  stopped due to  Sob with sats 99%    ?-  04/27/2020     alpha one AT phenotype  MM level 141  ?- 05/15/2020  After extensive coaching inhaler device,  effectiveness =    75% from a baseline of well < 50%  So ok to rechallenge with prn saba ? ?Adequate control on present rx, reviewed in detail with pt > no change in rx needed   ? ? ?

## 2021-07-06 NOTE — Assessment & Plan Note (Signed)
See HRCT  02/18/20 Diffuse bronchial wall thickening with mild to ?moderate centrilobular and paraseptal emphysema. 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 ? ?Reviewed max use of guaifenesin and flutter and approp prn use of zpak ? ? ?    ?  ? ?Each maintenance medication was reviewed in detail including emphasizing most importantly the difference between maintenance and prns and under what circumstances the prns are to be triggered using an action plan format where appropriate. ? ?Total time for H and P, chart review, counseling, reviewing flutter device(s) and generating customized AVS unique to this office visit / same day charting =25mn  ?     ?

## 2021-07-06 NOTE — Assessment & Plan Note (Addendum)
Onset 2016 assoc with empyema on R  - swallowing eval 11/2018  Oropharyngeal dysphagia s asp/ no restrictions  - off lisinopril  08/13/19 - Allergy profile 12/13/19  >  Eos 0.3 /  IgE  58 - 12/13/2019 empirically try gerd rx x 6 weeks > no better then stopped  -   Sinus CT  02/18/20  Congested appearance of nasal mucosa  but  Negative for sinusitis - 05/15/2020 added back gerd rx x 6 weeks due to evening cough ? Related to meals> improved as did chest tightness   Cough continues to be his major symptom and is worse on Entresto The sacubitrilat component of entresto is not and ACEi but it does lead to higher levels of bradykinin (the culprit in ACEi related cough) because it reduces Neprilysin based clearance of bradykinin.  The typical symptoms are dry daytime cough (9% per PI) or complaints of a new sensation of globus or excess PNDS.   >>> consider 3 m trial off entresto and just on the valsartan component but defer to Dr Harl Bowie (Dr Marigene Ehlers supports this in the ch[facility] clinic in pts we have shared)   Onset 2016 assoc with empyema on R  - swallowing eval 11/2018  Oropharyngeal dysphagia s asp/ no restrictions  - off lisinopril  08/13/19 - Allergy profile 12/13/19  >  Eos 0.3 /  IgE  58 - 12/13/2019 empirically try gerd rx x 6 weeks > no better then stopped  -   Sinus CT  02/18/20  Congested appearance of nasal mucosa  but  Negative for sinusitis - 05/15/2020 added back gerd rx x 6 weeks due to evening cough ? Related to meals> improved as did chest tightness   Formatting of this note might be different from the original. Formatting of this note might be different from the original. Onset 2016 assoc with empyema on R  - swallowing eval 11/2018  Oropharyngeal dysphagia s asp/ no restrictions  - off lisinopril  08/13/19 - Allergy profile 12/13/19  >  Eos 0.3 /  IgE  58 - 12/13/2019 empirically try gerd rx x 6 weeks > no better then stopped  -   Sinus CT  02/18/20  Congested appearance of nasal mucosa  but   Negative for sinusitis - 05/15/2020 added back gerd rx x 6 weeks due to evening cough ? Related to meals> improved as did chest tightness   Last Assessment & Plan:  Formatting of this note might be different from the original. Onset 2016 assoc with empyema on R  - swallowing eval 11/2018  Oropharyngeal dysphagia s asp/ no restrictions  - off lisinopril  08/13/19 - Allergy profile 12/13/19  >  Eos 0.3 /  IgE  58 - 12/13/2019 empirically try gerd rx x 6 weeks > no better then stopped  -   Sinus CT  02/18/20  Congested appearance of nasal mucosa  but  Negative for sinusitis - 05/15/2020 added back gerd rx x 6 weeks due to evening cough ? Related to meals> improved as did chest tightness as of 06/23/2020   rec 3 m rx for gerd then taper off and f/u GI prn flare  F/u here q 6 m  Each maintenance medication was reviewed in detail including emphasizing most importantly the difference between maintenance and prns and under what circumstances the prns are to be triggered using an action plan format where appropriate.  Total time for H and P, chart review, counseling, reviewing flutter device(s) and generating customized AVS  unique to this office visit / same day charting = 25 min

## 2021-07-09 DIAGNOSIS — Z20822 Contact with and (suspected) exposure to covid-19: Secondary | ICD-10-CM | POA: Diagnosis not present

## 2021-07-15 DIAGNOSIS — Z23 Encounter for immunization: Secondary | ICD-10-CM | POA: Diagnosis not present

## 2021-07-18 DIAGNOSIS — I5022 Chronic systolic (congestive) heart failure: Secondary | ICD-10-CM | POA: Diagnosis not present

## 2021-07-18 DIAGNOSIS — I10 Essential (primary) hypertension: Secondary | ICD-10-CM | POA: Diagnosis not present

## 2021-07-23 ENCOUNTER — Ambulatory Visit: Payer: Medicare Other | Admitting: Cardiology

## 2021-07-24 DIAGNOSIS — Z20822 Contact with and (suspected) exposure to covid-19: Secondary | ICD-10-CM | POA: Diagnosis not present

## 2021-07-26 ENCOUNTER — Other Ambulatory Visit: Payer: Medicare Other | Admitting: Urology

## 2021-07-29 ENCOUNTER — Other Ambulatory Visit: Payer: Medicare Other | Admitting: Urology

## 2021-07-29 DIAGNOSIS — B351 Tinea unguium: Secondary | ICD-10-CM | POA: Diagnosis not present

## 2021-07-30 ENCOUNTER — Encounter: Payer: Self-pay | Admitting: Urology

## 2021-07-30 ENCOUNTER — Ambulatory Visit (INDEPENDENT_AMBULATORY_CARE_PROVIDER_SITE_OTHER): Payer: Medicare Other | Admitting: Urology

## 2021-07-30 VITALS — BP 138/72 | HR 80 | Ht 72.0 in | Wt 187.0 lb

## 2021-07-30 DIAGNOSIS — N3289 Other specified disorders of bladder: Secondary | ICD-10-CM | POA: Diagnosis not present

## 2021-07-30 DIAGNOSIS — Z8551 Personal history of malignant neoplasm of bladder: Secondary | ICD-10-CM | POA: Diagnosis not present

## 2021-07-30 DIAGNOSIS — C679 Malignant neoplasm of bladder, unspecified: Secondary | ICD-10-CM | POA: Diagnosis not present

## 2021-07-30 LAB — URINALYSIS, COMPLETE
Bilirubin, UA: NEGATIVE
Ketones, UA: NEGATIVE
Leukocytes,UA: NEGATIVE
Nitrite, UA: NEGATIVE
Protein,UA: NEGATIVE
RBC, UA: NEGATIVE
Specific Gravity, UA: 1.015 (ref 1.005–1.030)
Urobilinogen, Ur: 0.2 mg/dL (ref 0.2–1.0)
pH, UA: 6.5 (ref 5.0–7.5)

## 2021-07-30 LAB — MICROSCOPIC EXAMINATION: Bacteria, UA: NONE SEEN

## 2021-07-30 MED ORDER — SULFAMETHOXAZOLE-TRIMETHOPRIM 800-160 MG PO TABS
1.0000 | ORAL_TABLET | Freq: Once | ORAL | Status: AC
  Administered 2021-07-30: 1 via ORAL

## 2021-07-30 NOTE — Progress Notes (Signed)
All ? ?07/30/21 ? ?CC:  ?Chief Complaint  ?Patient presents with  ? Cysto  ? ? ?Urologic history: ?1.  Urothelial carcinoma bladder ?TURBT 06/16/2020 < 2 cm papillary bladder tumor ?Post resection intravesical gemcitabine ?Pathology low-grade, noninvasive urothelial carcinoma bladder ? ? ?HPI: No complaints since TURBT.  No voiding difficulty or gross hematuria ? ?Refer to rooming tab for vitals ?NED. A&Ox3.   ?No respiratory distress   ?Abd soft, NT, ND ?Normal phallus with bilateral descended testicles ? ?Cystoscopy Procedure Note ? ?Patient identification was confirmed, informed consent was obtained, and patient was prepped using Betadine solution.  Lidocaine jelly was administered per urethral meatus.   ? ? ?Pre-Procedure: ?- Inspection reveals a normal caliber urethral meatus. ? ?Procedure: ?The flexible cystoscope was introduced without difficulty ?- No urethral strictures/lesions are present. ?- Prominent lateral lobe enlargement prostate  ?- Moderate elevation bladder neck ?- Bilateral ureteral orifices identified ?- Bladder mucosa  reveals no ulcers, tumors, or lesions ?- No bladder stones ?- moderate trabeculation, scattered diverticula ? ?Retroflexion shows no abnormalities ? ? ?Post-Procedure: ?- Patient tolerated the procedure well ? ?Assessment/ Plan: ?No evidence recurrent urothelial carcinoma ?Schedule surveillance cystoscopy 1 year ?History post cystoscopy UTI and he was given Septra DS 1 tab preprocedure ? ? ?[name] Sons, MD ? ?

## 2021-10-11 DIAGNOSIS — G4700 Insomnia, unspecified: Secondary | ICD-10-CM | POA: Diagnosis not present

## 2021-10-11 DIAGNOSIS — F5105 Insomnia due to other mental disorder: Secondary | ICD-10-CM | POA: Diagnosis not present

## 2021-10-11 DIAGNOSIS — F329 Major depressive disorder, single episode, unspecified: Secondary | ICD-10-CM | POA: Diagnosis not present

## 2021-10-20 ENCOUNTER — Telehealth: Payer: Self-pay | Admitting: Cardiology

## 2021-10-20 NOTE — Telephone Encounter (Signed)
Pt is returning call. Pt is requesting call back.

## 2021-10-20 NOTE — Telephone Encounter (Signed)
Patient c/o Palpitations:  High priority if patient c/o lightheadedness, shortness of breath, or chest pain  How long have you had palpitations/irregular HR/ Afib? Are you having the symptoms now? Couple weeks, no  Are you currently experiencing lightheadedness, SOB or CP? Extreme exhaustion  Do you have a history of afib (atrial fibrillation) or irregular heart rhythm? no  Have you checked your BP or HR? (document readings if available): a week ago does not have readings but remembers they were in normal range   Are you experiencing any other symptoms? Depression, insomnia, extreme fatigue  Patient states he is not sure if his symptoms are heart related or not. He says the palpitations don't feel irregular, he can just feel his pulse in his chest.

## 2021-10-20 NOTE — Telephone Encounter (Signed)
Patient states that he would like to see Dr. Harl Bowie sooner than 9/1 and would be willing to go to [place]. States that he has been some palpitations and thinks that it could be coming from being extremely exhausted and not getting enough sleep. Denies any chest pain, sob or dizziness.

## 2021-10-21 ENCOUNTER — Other Ambulatory Visit: Payer: Self-pay | Admitting: Cardiology

## 2021-10-21 ENCOUNTER — Telehealth: Payer: Self-pay | Admitting: Cardiology

## 2021-10-21 ENCOUNTER — Ambulatory Visit (INDEPENDENT_AMBULATORY_CARE_PROVIDER_SITE_OTHER): Payer: Medicare Other

## 2021-10-21 DIAGNOSIS — R002 Palpitations: Secondary | ICD-10-CM

## 2021-10-21 NOTE — Telephone Encounter (Signed)
Patient states that he can come into the [place] office tomorrow morning around 11:30 to have the monitor placed.

## 2021-10-21 NOTE — Telephone Encounter (Signed)
Sent patient a Pharmacist, community message stating that he did not need to come into the office for a nurse visit and that a monitor will be mailed to his home.

## 2021-10-21 NOTE — Telephone Encounter (Signed)
PERCERT:  7 day Zio, home enrollment 8/3,

## 2021-10-21 NOTE — Telephone Encounter (Signed)
Can he wear a 7 day zio patch for palpitations, would be important information to gather before our appt. For now would keep Sept 1 appt, [place] would be important to complete   Zandra Abts MD

## 2021-10-24 DIAGNOSIS — R002 Palpitations: Secondary | ICD-10-CM | POA: Diagnosis not present

## 2021-11-05 DIAGNOSIS — Z23 Encounter for immunization: Secondary | ICD-10-CM | POA: Diagnosis not present

## 2021-11-08 DIAGNOSIS — R002 Palpitations: Secondary | ICD-10-CM | POA: Diagnosis not present

## 2021-11-10 DIAGNOSIS — N1831 Chronic kidney disease, stage 3a: Secondary | ICD-10-CM | POA: Diagnosis not present

## 2021-11-10 DIAGNOSIS — I2510 Atherosclerotic heart disease of native coronary artery without angina pectoris: Secondary | ICD-10-CM | POA: Diagnosis not present

## 2021-11-10 DIAGNOSIS — Z125 Encounter for screening for malignant neoplasm of prostate: Secondary | ICD-10-CM | POA: Diagnosis not present

## 2021-11-10 DIAGNOSIS — R7301 Impaired fasting glucose: Secondary | ICD-10-CM | POA: Diagnosis not present

## 2021-11-16 DIAGNOSIS — I2510 Atherosclerotic heart disease of native coronary artery without angina pectoris: Secondary | ICD-10-CM | POA: Diagnosis not present

## 2021-11-16 DIAGNOSIS — F5105 Insomnia due to other mental disorder: Secondary | ICD-10-CM | POA: Diagnosis not present

## 2021-11-16 DIAGNOSIS — J449 Chronic obstructive pulmonary disease, unspecified: Secondary | ICD-10-CM | POA: Diagnosis not present

## 2021-11-16 DIAGNOSIS — C679 Malignant neoplasm of bladder, unspecified: Secondary | ICD-10-CM | POA: Diagnosis not present

## 2021-11-16 DIAGNOSIS — K4090 Unilateral inguinal hernia, without obstruction or gangrene, not specified as recurrent: Secondary | ICD-10-CM | POA: Diagnosis not present

## 2021-11-16 DIAGNOSIS — K5909 Other constipation: Secondary | ICD-10-CM | POA: Diagnosis not present

## 2021-11-16 DIAGNOSIS — I5022 Chronic systolic (congestive) heart failure: Secondary | ICD-10-CM | POA: Diagnosis not present

## 2021-11-16 DIAGNOSIS — F331 Major depressive disorder, recurrent, moderate: Secondary | ICD-10-CM | POA: Diagnosis not present

## 2021-11-16 DIAGNOSIS — R634 Abnormal weight loss: Secondary | ICD-10-CM | POA: Diagnosis not present

## 2021-11-16 DIAGNOSIS — R0600 Dyspnea, unspecified: Secondary | ICD-10-CM | POA: Diagnosis not present

## 2021-11-16 DIAGNOSIS — R7303 Prediabetes: Secondary | ICD-10-CM | POA: Diagnosis not present

## 2021-11-16 DIAGNOSIS — R053 Chronic cough: Secondary | ICD-10-CM | POA: Diagnosis not present

## 2021-11-18 DIAGNOSIS — B353 Tinea pedis: Secondary | ICD-10-CM | POA: Diagnosis not present

## 2021-11-18 DIAGNOSIS — B351 Tinea unguium: Secondary | ICD-10-CM | POA: Diagnosis not present

## 2021-11-19 ENCOUNTER — Encounter: Payer: Self-pay | Admitting: Cardiology

## 2021-11-19 ENCOUNTER — Ambulatory Visit: Payer: Medicare Other | Attending: Cardiology | Admitting: Cardiology

## 2021-11-19 VITALS — BP 126/78 | HR 60 | Ht 72.0 in | Wt 185.2 lb

## 2021-11-19 DIAGNOSIS — I5022 Chronic systolic (congestive) heart failure: Secondary | ICD-10-CM | POA: Diagnosis not present

## 2021-11-19 DIAGNOSIS — E782 Mixed hyperlipidemia: Secondary | ICD-10-CM

## 2021-11-19 DIAGNOSIS — I2510 Atherosclerotic heart disease of native coronary artery without angina pectoris: Secondary | ICD-10-CM | POA: Diagnosis not present

## 2021-11-19 DIAGNOSIS — R002 Palpitations: Secondary | ICD-10-CM | POA: Diagnosis not present

## 2021-11-19 MED ORDER — NITROGLYCERIN 0.4 MG SL SUBL: 0.4000 mg | SUBLINGUAL_TABLET | SUBLINGUAL | 3 refills | Status: DC | PRN

## 2021-11-19 NOTE — Progress Notes (Signed)
Clinical Summary [name] [name] is a 76 y.o.male seen today for follow up of the following medical problems.    1. CAD/ICM/Chronic systolic HF - admit 03/8839 with STEMI. Received DES to LAD, PTCA of diag - 09/2017 echo LVEF 30-35%. He was discharged with lifevest for primary prevention - repeat 10/2017 35-40%, lifevest discontinued - given afib was started on plavix, asa, and anticoag x 1 month, then plavix and anticoag.    - medical therapy initially limited due to soft bp's and prior renal dysfunction.      02/2019 echo: LVEF 66-06%, grade I diasotlic dysfunction  05/158 echo: LVEF 40-45%, grade I dd, normal RV, mild MR       - last visit we started aldactone 12.'5mg'$  daily. F/u K was 5.5, Cr 1.34 - repeat labs 8/23 Cr 1.4, K 5.3     2. COPD/Chronic cough - followed by Dr Melvyn Novas       3. Afib in setting of STEMI - new diagnosis during his admission with acute MI. Unclear if isolated in setting of MI  02/2018 event monitor without recurrent afib, eliquis was stopped   - phone call 10/20/21 with palpitations - 10/2021 monitor with PACs, PVCs, rare short runs SVT longest 9 beats - weaning caffeine. Symptoms have improved.       4. Hyperlipidemia  Jan 2022 TC 99 TG 71 HDL 47 LDL 37 - he is on rosuvatatin   - 02/2021 TC 116 TG 59 HDL 43 LDL 59 - 10/2021 TC 99 TG 58 HDL 44 LDL 42   5. Bladder tumor -  surgery 06/16/20 . The resected tumor was a low-grade urothelial carcinoma the bladder.  No muscle invasion present.    6. Depression   Spends a lot of time at [facility] where he has a place.    Past Medical History:  Diagnosis Date   Acute systolic heart failure (HCC)    Anxiety    COPD (chronic obstructive pulmonary disease) (HCC)    Enlarged prostate with lower urinary tract symptoms (LUTS)    GERD (gastroesophageal reflux disease)    Hyperlipidemia    Hypertension    Low serum testosterone level    PAF (paroxysmal atrial fibrillation) ([place])    Pneumonia  05/2014   Pyothorax without fistula (HCC)    Sciatica    Shortness of breath dyspnea    Solitary pulmonary nodule    STEMI (ST elevation myocardial infarction) ([place])    09/27/17 PCI/DES x1 mLAD, PTCA of the diag [name] [name]. EF 30% Lifevest at discharge.    Testicular hypofunction    Urinary frequency      No Known Allergies   Current Outpatient Medications  Medication Sig Dispense Refill   acetaminophen (TYLENOL) 500 MG tablet Take 500 mg by mouth every 6 (six) hours as needed for moderate pain.     aspirin EC 81 MG tablet Take 1 tablet (81 mg total) by mouth daily. 90 tablet 3   azithromycin (ZITHROMAX) 250 MG tablet Take 2 on day one then 1 daily x 4 days 6 tablet 11   Calcium Citrate-Vitamin D (CALCIUM CITRATE + D3 PO) Take 1 tablet by mouth daily.     Carboxymethylcellul-Glycerin (LUBRICATING EYE DROPS OP) Place 1 drop into both eyes 2 (two) times daily. As needed     carvedilol (COREG) 6.25 MG tablet TAKE 1 TABLET(6.25 MG) BY MOUTH TWICE DAILY WITH A MEAL 180 tablet 3   dapagliflozin propanediol (FARXIGA) 10 MG TABS tablet Take  1 tablet (10 mg total) by mouth daily. 90 tablet 3   Dextromethorphan-guaiFENesin (MUCUS RELIEF DM PO) Take by mouth.     Menthol, Topical Analgesic, (BIOFREEZE EX) Apply topically.     Multiple Vitamins-Minerals (SENIOR VITES PO) Take 1 tablet by mouth daily.     nitroGLYCERIN (NITROSTAT) 0.4 MG SL tablet Place 1 tablet (0.4 mg total) under the tongue every 5 (five) minutes as needed. 25 tablet 2   NON FORMULARY Fluc2% toe nail drops--apply topically once daily for toenail fungus     rosuvastatin (CRESTOR) 40 MG tablet TAKE 1 TABLET(40 MG) BY MOUTH DAILY 90 tablet 3   sacubitril-valsartan (ENTRESTO) 24-26 MG Take 1 tablet by mouth 2 (two) times daily. 180 tablet 3   spironolactone (ALDACTONE) 25 MG tablet Take 0.5 tablets (12.5 mg total) by mouth daily. 45 tablet 3   tamsulosin (FLOMAX) 0.4 MG CAPS capsule Take 1 capsule (0.4 mg total) by mouth daily. 30  capsule 2   No current facility-administered medications for this visit.     Past Surgical History:  Procedure Laterality Date   APPENDECTOMY     BACK SURGERY     COLONOSCOPY  2011   Tubular adenoma per patient   COLONOSCOPY N/A 03/03/2015   Dr. Oneida Alar: Left colon is redundant, mild diverticulosis, small internal hemorrhoids, moderate external hemorrhoids.  Next colonoscopy in 5 years with overtube.   COLONOSCOPY N/A 03/25/2019   Procedure: COLONOSCOPY;  Surgeon: Danie Binder, MD;  [facility];  Service: Endoscopy;  Laterality: N/A;  8:30am - Camille's request   CORONARY/GRAFT ACUTE MI REVASCULARIZATION N/A 09/27/2017   Procedure: Coronary/Graft Acute MI Revascularization;  Surgeon: Burnell Blanks, MD;  Location: [place] [facility];  Service: Cardiovascular;  Laterality: N/A;   CYSTOSCOPY W/ RETROGRADES Bilateral 06/16/2020   Procedure: CYSTOSCOPY WITH RETROGRADE PYELOGRAM;  Surgeon: [name], MD;  Location: [facility];  Service: Urology;  Laterality: Bilateral;   EMPYEMA DRAINAGE Right 06/27/2014   Procedure: EMPYEMA DRAINAGE;  Surgeon: Grace Isaac, MD;  Location: [place];  Service: Thoracic;  Laterality: Right;   HEMORRHOID BANDING N/A 03/03/2015   Procedure: Thayer Jew;  Surgeon: Danie Binder, MD;  Location: [facility];  Service: Endoscopy;  Laterality: N/A;  Recomended to see a Psychologist, sport and exercise.   HERNIA REPAIR     INTRAVASCULAR ULTRASOUND/IVUS N/A 09/27/2017   Procedure: Intravascular Ultrasound/IVUS;  Surgeon: Burnell Blanks, MD;  Location: [place] [facility];  Service: Cardiovascular;  Laterality: N/A;   LEFT HEART CATH AND CORONARY ANGIOGRAPHY N/A 09/27/2017   Procedure: LEFT HEART CATH AND CORONARY ANGIOGRAPHY;  Surgeon: Burnell Blanks, MD;  Location: [place] [facility];  Service: Cardiovascular;  Laterality: N/A;   LUNG SURGERY  2016   POLYPECTOMY  03/25/2019   Procedure: POLYPECTOMY;  Surgeon: Danie Binder, MD;  [facility];  Service: Endoscopy;;   TEE WITHOUT CARDIOVERSION N/A 07/14/2014   Procedure: TRANSESOPHAGEAL ECHOCARDIOGRAM (TEE);  Surgeon: Sueanne Margarita, MD;  Location: [place];  Service: Cardiovascular;  Laterality: N/A;   TRANSURETHRAL RESECTION OF BLADDER TUMOR N/A 06/16/2020   Procedure: TRANSURETHRAL RESECTION OF BLADDER TUMOR (TURBT) WITH GEMCITABINE;  Surgeon: [name], MD;  Location: [facility];  Service: Urology;  Laterality: N/A;   VARICOSE VEIN SURGERY     VIDEO ASSISTED THORACOSCOPY Right 06/27/2014   Procedure: VIDEO ASSISTED THORACOSCOPY;  Surgeon: Grace Isaac, MD;  Location: [place];  Service: Thoracic;  Laterality: Right;   VIDEO BRONCHOSCOPY N/A  06/27/2014   Procedure: VIDEO BRONCHOSCOPY;  Surgeon: Grace Isaac, MD;  Location: [facility];  Service: Thoracic;  Laterality: N/A;     No Known Allergies    Family History  Problem Relation Age of Onset   Tuberculosis Father    Hypertension Father    COPD Mother    Hypertension Mother    Colon cancer Neg Hx      Social History Mr. Niebuhr reports that he quit smoking about 7 years ago. His smoking use included pipe and cigarettes. He has a 10.00 pack-year smoking history. He has never used smokeless tobacco. Mr. Isenberg reports that he does not currently use alcohol.   Review of Systems CONSTITUTIONAL: No weight loss, fever, chills, weakness or fatigue.  HEENT: Eyes: No visual loss, blurred vision, double vision or yellow sclerae.No hearing loss, sneezing, congestion, runny nose or sore throat.  SKIN: No rash or itching.  CARDIOVASCULAR: per hpi RESPIRATORY: No shortness of breath, cough or sputum.  GASTROINTESTINAL: No anorexia, nausea, vomiting or diarrhea. No abdominal pain or blood.  GENITOURINARY: No burning on urination, no polyuria NEUROLOGICAL: No headache, dizziness, syncope, paralysis, ataxia, numbness or tingling in the extremities. No change in bowel or bladder control.  MUSCULOSKELETAL: No muscle,  back pain, joint pain or stiffness.  LYMPHATICS: No enlarged nodes. No history of splenectomy.  PSYCHIATRIC: No history of depression or anxiety.  ENDOCRINOLOGIC: No reports of sweating, cold or heat intolerance. No polyuria or polydipsia.  Marland Kitchen   Physical Examination Today's Vitals   11/19/21 1033  BP: 126/78  Pulse: 60  SpO2: 97%  Weight: 185 lb 3.2 oz (84 kg)  Height: 6' (1.829 m)   Body mass index is 25.12 kg/m.  Gen: resting comfortably, no acute distress HEENT: no scleral icterus, pupils equal round and reactive, no palptable cervical adenopathy,  CV: RRR, no m/r/g, no jvd Resp: Clear to auscultation bilaterally GI: abdomen is soft, non-tender, non-distended, normal bowel sounds, no hepatosplenomegaly MSK: extremities are warm, no edema.  Skin: warm, no rash Neuro:  no focal deficits Psych: appropriate affect   Diagnostic Studies 09/2017 cath Prox RCA lesion is 30% stenosed. Prox LAD lesion is 100% stenosed. Prox Cx lesion is 20% stenosed. A drug-eluting stent was successfully placed using a STENT SYNERGY DES 3X16. Post intervention, there is a 0% residual stenosis. Ost 1st Diag lesion is 100% stenosed. Balloon angioplasty was performed using a BALLOON SAPPHIRE 2.5X12. Post intervention, there is a 40% residual stenosis. Prox LAD to Mid LAD lesion is 40% stenosed.   1. Acute anterior STEMI secondary to occlusion of the mid LAD 2. Successful PTCA/DES x 1 mid LAD 3. Successful PTCA/angioplasty ostium of the Diagonal [name] [name].  4. Mild non-obstructive disease in the RCA, circumflex and mid LAD   Post cath Recommendations:  Will admit to the ICU and continue Aggrastat for 6 hours. Will start high intensity statin and a beta blocker. Echo later today.   . Recommend uninterrupted dual antiplatelet therapy with Aspirin '81mg'$  daily and Ticagrelor '90mg'$  twice daily for a minimum of 12 months (ACS - Class I recommendation).      09/2017 echo Study Conclusions   - Left  ventricle: The cavity size was normal. There was mild focal   basal hypertrophy of the septum. Systolic function was moderately   to severely reduced. The estimated ejection fraction was in the   range of 30% to 35%. Severe hypokinesis of the mid to apical   anteroseptal, anterior, and anterolateral myocardium. Dyskinetic  at apex. Doppler parameters are consistent with abnormal left   ventricular relaxation (grade 1 diastolic dysfunction). - Aortic valve: Trileaflet; mildly thickened, mildly calcified   leaflets. - Mitral valve: Calcified annulus. There was trivial regurgitation. - Pulmonary arteries: PA peak pressure: 41 mm Hg (S). - Pericardium, extracardiac: A small pericardial effusion was   identified.   Impressions:   - New reduction in LV EF with regional wall motion abnormalities in   the mid to apical anteroseptum, anterior, and anterolateral walls   with dyskinesis at the apex. Small pericardial effusion noted.   10/2017 echo Study Conclusions   - Left ventricle: The cavity size was normal. Wall thickness was   increased in a pattern of moderate LVH. Systolic function was   moderately reduced. The estimated ejection fraction was in the   range of 35% to 40%. Doppler parameters are consistent with   abnormal left ventricular relaxation (grade 1 diastolic   dysfunction). Doppler parameters are consistent with high   ventricular filling pressure. - Regional wall motion abnormality: Hypokinesis of the apical   septal, apical lateral, and apical myocardium. - Aortic valve: Mildly calcified annulus. Trileaflet; mildly   thickened leaflets. Valve area (VTI): 4.91 cm^2. Valve area   (Vmax): 4.27 cm^2. Valve area (Vmean): 3.54 cm^2. - Mitral valve: Mildly calcified annulus. Mildly thickened leaflets   . - Left atrium: The atrium was moderately dilated. - Pericardium, extracardiac: There is a small circumferential   pericardial effusion. The effusion measures 0.8 cm adjacent  to   the LV. There is no evidence of tamponade physiology. - Technically adequate study.     02/2018 heart monitor 21 day event monitor Min HR 48, Max HR 130, Avg HR 71. Min HR occurred in early AM hours presumably while sleeping No symptoms reported 4 beat run of NSVT that was asymptomatic     02/2019 echo   IMPRESSIONS      1. Left ventricular ejection fraction, by visual estimation, is 40 to 45%. The left ventricle has mildly decreased function. There is moderately increased left ventricular hypertrophy.  2. Elevated left atrial pressure.  3. Left ventricular diastolic parameters are consistent with Grade I diastolic dysfunction (impaired relaxation).  4. Hypokinesis of the apex, mid to distal anteroseptal, apical lateral, and apical myocardium.  5. Global right ventricle has normal systolic function.The right ventricular size is normal. No increase in right ventricular wall thickness.  6. Left atrial size was moderately dilated.  7. Right atrial size was normal.  8. The mitral valve is normal in structure. Mild mitral valve regurgitation. No evidence of mitral stenosis.  9. The tricuspid valve is normal in structure. Tricuspid valve regurgitation is not demonstrated. 10. The aortic valve is tricuspid. Aortic valve regurgitation is not visualized. No evidence of aortic valve sclerosis or stenosis. 11. The pulmonic valve was not well visualized. Pulmonic valve regurgitation is not visualized. 12. The inferior vena cava is normal in size with greater than 50% respiratory variability, suggesting right atrial pressure of 3 mmHg.   10/2020 echo 1. Left ventricular ejection fraction, by estimation, is 40 to 45%. The  left ventricle has mildly decreased function. The left ventricle  demonstrates regional wall motion abnormalities (see scoring  diagram/findings for description). There is mild left  ventricular hypertrophy. Left ventricular diastolic parameters are  consistent with Grade  I diastolic dysfunction (impaired relaxation).   2. Right ventricular systolic function is normal. The right ventricular  size is normal. Tricuspid regurgitation signal is inadequate for assessing  PA pressure.   3. Left atrial size was mildly dilated.   4. The mitral valve is degenerative. Mild mitral valve regurgitation. The  mean mitral valve gradient is 1.0 mmHg.   5. The aortic valve is tricuspid. There is mild calcification of the  aortic valve. Aortic valve regurgitation is not visualized. Mild aortic  valve sclerosis is present, with no evidence of aortic valve stenosis.  Aortic valve mean gradient measures 3.0  mmHg.   6. The inferior vena cava is normal in size with greater than 50%  respiratory variability, suggesting right atrial pressure of 3 mmHg.             Assessment and Plan  1. CAD/ICM/chronic systolic HF - medical therapy previously limited by prior renal dysfunction, soft bp's however both issues have resolved.  - slight uptrend in Cr, we recentlt started aldactone and previously changed to entresto. Cr 1.4 on recent check, repeat in 1 month. If further increase I think may have to go back to losartan as opposed to entresto - no symptoms.      2. Hyperlipidemia - at goal, continue current meds  3. Palpitations - monitor with benign ectopy, short infrequent SVT - symptoms have resolved. Monitor at this tmie, could titrate coreg if needed   F/u 6 months. F/u labs 1 month   Arnoldo Lenis, M.D.

## 2021-11-19 NOTE — Patient Instructions (Addendum)
Medication Instructions:  Continue all current medications.  Labwork: BMET - order given today Please do in 1 month (around 12/19/21) Office will contact with results via phone, letter or mychart.     Testing/Procedures: none  Follow-Up: 6 months   Any Other Special Instructions Will Be Listed Below (If Applicable).   If you need a refill on your cardiac medications before your next appointment, please call your pharmacy.

## 2021-11-23 ENCOUNTER — Ambulatory Visit (INDEPENDENT_AMBULATORY_CARE_PROVIDER_SITE_OTHER): Payer: Medicare Other | Admitting: Orthopaedic Surgery

## 2021-11-23 ENCOUNTER — Encounter: Payer: Self-pay | Admitting: Orthopaedic Surgery

## 2021-11-23 ENCOUNTER — Ambulatory Visit (INDEPENDENT_AMBULATORY_CARE_PROVIDER_SITE_OTHER): Payer: Medicare Other

## 2021-11-23 VITALS — BP 121/64 | HR 70 | Ht 72.0 in | Wt 184.4 lb

## 2021-11-23 DIAGNOSIS — M545 Low back pain, unspecified: Secondary | ICD-10-CM | POA: Diagnosis not present

## 2021-11-23 DIAGNOSIS — I2510 Atherosclerotic heart disease of native coronary artery without angina pectoris: Secondary | ICD-10-CM | POA: Diagnosis not present

## 2021-11-23 MED ORDER — CYCLOBENZAPRINE HCL 5 MG PO TABS: ORAL_TABLET | ORAL | 1 refills | Status: DC

## 2021-11-23 NOTE — Progress Notes (Signed)
My back hurts at night.  He has had back pain on and off for years.  He had surgery of the lower back in 2019.  He has pain now that bothers him at night.  It is a different type of pain than he had before his surgery in 2019.  His pain is localized to the lower back with sometimes some tenderness of the posterior thighs.  He has no weakness,no trauma.    He has changed beds he sleeps on with no change in the pain.  He has no pain once he gets up and gets going during the day.  He has no pain during the day and his activities during the day.  Nothing seems to help the pain at night.  He is not sleeping well.  This has been going on several months now.  His family doctor asked that I see him. I have read the notes.  He takes tylenol for pain and cannot take NSAIDs secondary to kidney issues.  Spine/Pelvis examination:  Inspection:  Overall, sacoiliac joint benign and hips nontender; without crepitus or defects.   Thoracic spine inspection: Alignment normal without kyphosis present   Lumbar spine inspection:  Alignment  with normal lumbar lordosis, without scoliosis apparent.   Thoracic spine palpation:  without tenderness of spinal processes   Lumbar spine palpation: without tenderness of lumbar area; without tightness of lumbar muscles    Range of Motion:   Lumbar flexion, forward flexion is good lacking about a foot from touching the toes, without pain or tenderness    Lumbar extension is full without pain or tenderness   Left lateral bend is normal without pain or tenderness   Right lateral bend is normal without pain or tenderness   Straight leg raising is normal  Strength & tone: normal   Stability overall normal stability  X-rays were done of the lumbar spine, reported separately.  Encounter Diagnosis  Name Primary?   Lumbar pain Yes   I have shown him his X-rays.  I will begin Flexeril 5 at bedtime.  Cut in half if it makes him too sleepy.  Likewise, after a week if he has  no effects of improvement, take two at night.  Return in three weeks.  Call if any problem.  Precautions discussed.  Electronically Signed Sanjuana Kava, MD 9/5/20238:45 AM

## 2021-11-23 NOTE — Patient Instructions (Signed)
Start the muscle relaxer tonight before bed.   Call back in one week to give Dr.Keeling an update. Increase or decrease according to how it is working. You may increase to '10mg'$ /2 tablets after 1 week if '5mg'$  is not helping.

## 2021-11-24 DIAGNOSIS — D225 Melanocytic nevi of trunk: Secondary | ICD-10-CM | POA: Diagnosis not present

## 2021-11-24 DIAGNOSIS — Z1283 Encounter for screening for malignant neoplasm of skin: Secondary | ICD-10-CM | POA: Diagnosis not present

## 2021-11-24 DIAGNOSIS — B353 Tinea pedis: Secondary | ICD-10-CM | POA: Diagnosis not present

## 2021-11-25 DIAGNOSIS — H6123 Impacted cerumen, bilateral: Secondary | ICD-10-CM | POA: Diagnosis not present

## 2021-11-25 DIAGNOSIS — H608X1 Other otitis externa, right ear: Secondary | ICD-10-CM | POA: Diagnosis not present

## 2021-12-01 ENCOUNTER — Encounter: Payer: Self-pay | Admitting: Orthopaedic Surgery

## 2021-12-14 ENCOUNTER — Encounter: Payer: Self-pay | Admitting: Orthopaedic Surgery

## 2021-12-14 ENCOUNTER — Ambulatory Visit (INDEPENDENT_AMBULATORY_CARE_PROVIDER_SITE_OTHER): Payer: Medicare Other | Admitting: Orthopaedic Surgery

## 2021-12-14 VITALS — BP 117/62 | HR 68 | Ht 72.0 in | Wt 184.0 lb

## 2021-12-14 DIAGNOSIS — M545 Low back pain, unspecified: Secondary | ICD-10-CM

## 2021-12-14 DIAGNOSIS — I2510 Atherosclerotic heart disease of native coronary artery without angina pectoris: Secondary | ICD-10-CM

## 2021-12-14 NOTE — Progress Notes (Signed)
My back is a little better.  He has been taking the Flexeril 5 at night and he is sleeping better and having less back pain.  He will awaken at times and gets up then goes back to bed.  He is improved overall.  Spine/Pelvis examination:  Inspection:  Overall, sacoiliac joint benign and hips nontender; without crepitus or defects.   Thoracic spine inspection: Alignment normal without kyphosis present   Lumbar spine inspection:  Alignment  with normal lumbar lordosis, without scoliosis apparent.   Thoracic spine palpation:  without tenderness of spinal processes   Lumbar spine palpation: without tenderness of lumbar area; without tightness of lumbar muscles    Range of Motion:   Lumbar flexion, forward flexion is normal without pain or tenderness    Lumbar extension is full without pain or tenderness   Left lateral bend is normal without pain or tenderness   Right lateral bend is normal without pain or tenderness   Straight leg raising is normal  Strength & tone: normal   Stability overall normal stability  Encounter Diagnosis  Name Primary?   Lumbar pain Yes   I will see prn.  Call if any problem.  Precautions discussed.  He can double up on the Flexeril if needed.  Electronically Signed Sanjuana Kava, MD 9/26/20239:16 AM

## 2021-12-17 DIAGNOSIS — Z23 Encounter for immunization: Secondary | ICD-10-CM | POA: Diagnosis not present

## 2021-12-20 DIAGNOSIS — I5022 Chronic systolic (congestive) heart failure: Secondary | ICD-10-CM | POA: Diagnosis not present

## 2021-12-21 LAB — BASIC METABOLIC PANEL
BUN/Creatinine Ratio: 20 (calc) (ref 6–22)
BUN: 26 mg/dL — ABNORMAL HIGH (ref 7–25)
CO2: 29 mmol/L (ref 20–32)
Calcium: 9.1 mg/dL (ref 8.6–10.3)
Chloride: 104 mmol/L (ref 98–110)
Creat: 1.28 mg/dL (ref 0.70–1.28)
Glucose, Bld: 90 mg/dL (ref 65–99)
Potassium: 4.6 mmol/L (ref 3.5–5.3)
Sodium: 141 mmol/L (ref 135–146)

## 2021-12-23 ENCOUNTER — Encounter: Payer: Self-pay | Admitting: *Deleted

## 2022-01-10 ENCOUNTER — Ambulatory Visit (INDEPENDENT_AMBULATORY_CARE_PROVIDER_SITE_OTHER): Payer: Medicare Other | Admitting: Internal Medicine

## 2022-01-10 ENCOUNTER — Encounter: Payer: Self-pay | Admitting: Internal Medicine

## 2022-01-10 DIAGNOSIS — J479 Bronchiectasis, uncomplicated: Secondary | ICD-10-CM

## 2022-01-10 DIAGNOSIS — R053 Chronic cough: Secondary | ICD-10-CM | POA: Diagnosis not present

## 2022-01-10 DIAGNOSIS — I2510 Atherosclerotic heart disease of native coronary artery without angina pectoris: Secondary | ICD-10-CM

## 2022-01-10 DIAGNOSIS — J449 Chronic obstructive pulmonary disease, unspecified: Secondary | ICD-10-CM

## 2022-01-10 DIAGNOSIS — R0609 Other forms of dyspnea: Secondary | ICD-10-CM

## 2022-01-10 MED ORDER — VALSARTAN 80 MG PO TABS: ORAL_TABLET | ORAL | 11 refills | Status: DC

## 2022-01-10 NOTE — Progress Notes (Signed)
[name] [name], male    DOB: [birth year]    MRN: [id number]   Brief patient profile:  9  yowm electrical engineer MM/quit all smoking 2016  with R Empyema maint on spriva dpi /ACEi with variable cough but no doe since 2016 previously followed by Dr [name] referred to pulmonary clinic 05/21/2019 by Dr   [name]    History of Present Illness  05/21/2019  Pulmonary/ 1st office eval/[name] [name]  Chief Complaint  Patient presents with   Pulmonary Consult    Referred by Dr. [name] for eval of COPD. Former Dr [name] pt. He is maintained on spiriva and does not use a rescue inhaler. He states his breathing is doing well currently.    Dyspnea:  3 x weekly treadmill x 50 min at 3.6 -4 and 4.5  Cough: comes and goes x years variably productive not noct  Sleep: does fine in recliner / can do flat in bed  SABA use: none  rec Try off spiriva to see if you notice any difference in your exercise tolerance or cough For cough > mucinex dm up to 1200 mg every 12 hours as needed  Please schedule a follow up visit in 2 months at [name] office with PFT's first if possible      12/13/2019  f/u ov/Alixander Rallis re:  GOLD II barely / maint on bevespi and off lisinopril  08/13/19 Chief Complaint  Patient presents with   Follow-up    productive cough with thick, yellow phlegm  Dyspnea:  Treadmill x up to 55 min 4pm x 4%  Cough: ever since empyema surgery but changed early September 2021 better p zpak Sleeping: in recliner due to back / minimal cough  SABA use: none  02: none  rec Breztri Take 2 puffs first thing in am and then another 2 puffs about 12 hours later x 2 weeks and stop and then restart bevespi only if notice deterioration Pantoprazole (protonix) 40 mg   Take  30-60 min before first meal of the day and Pepcid (famotidine)  20 mg one after  Supper until return to office  GERD diet  Please remember to go to the lab and x-ray department at [facility]   for your tests - we will call you with the results  when they are available. Please schedule a follow up office visit in 6 weeks, call sooner if needed    01/21/2020  f/u ov/[name] [name] re: COPD GOLD II /  cough x 2016 when dx with R empyema  Chief Complaint  Patient presents with   Follow-up    productive cough with pinkish color phlegm  Dyspnea:  No change ex tol treadmill on or off breztri vs bevespi vs nothin  Cough: actually better while on treadmill/ still pink tinged variably on baby asa daily  Sleeping: worse on L side down  SABA use: none  02: none  rec For changed mucus from your normal go ahead and use zpak  If mucus gets really bloody, stop the baby aspirin until better  Ok to stop the reflux meds  - you may need to take pepcid 20 mg twice daily x one week then one daily x week and stop   schedule your CT chest and sinus and the results > bronchiectasis/ no sinusitis   07/05/2021  f/u ov/[name] office/[name] re: GOLD 2/ bronchiectasis maint on zpak prn   Chief Complaint  Patient presents with   Follow-up    SOB and cough are about the  same since last ov.   wants refill for z pak  Dyspnea:  treadmill same tol/sats  Cough: sporadic but this winter was the worst p entresto started / been with g dm but not on max doses / lots of am am mucus typically  Sleeping: bed blocks fine SABA use: none  02: none  Covid status: vax x 5  Lung cancer screening: not sure he's eligible Rec I will be referring you for lung cancer screening  - it actually isn't due until 03/11/22  The sacubitrilat component of entresto is not and ACEi but it does lead to higher levels of bradykinin Refill zpak  - should turn mucus clear or less dark Max dose guaifensin is 1200 mg every 12 hours and use the flutter valve if needed  Please schedule a follow up visit in 6  months but call sooner if needed    01/10/2022  f/u ov/[place] office/[name] [name] re: GOLD 2/ bronchiectasis  maint on zpak prn last took it early summer 2023   Chief Complaint  Patient  presents with   Follow-up    Not coughing up as much sputum as he used to but still coughing a lot    Dyspnea:  less energy to do treadmill  Cough: sporadic daytime/ in am mucus is yellow on mucinex dm qod  Sleeping: better on Right side  SABA use: none / doesn't think theyhelp  02: none      No obvious day to day or daytime variability or assoc   mucus plugs or hemoptysis or cp or chest tightness, subjective wheeze or overt sinus or hb symptoms.   Sleeping  without nocturnal  or early am exacerbation  of respiratory  c/o's or need for noct saba. Also denies any obvious fluctuation of symptoms with weather or environmental changes or other aggravating or alleviating factors except as outlined above   No unusual exposure hx or h/o childhood pna/ asthma or knowledge of premature birth.  Current Allergies, Complete Past Medical History, Past Surgical History, Family History, and Social History were reviewed in [employer] record.  ROS  The following are not active complaints unless bolded Hoarseness, sore throat, dysphagia, dental problems, itching, sneezing,  nasal congestion or discharge of excess mucus or purulent secretions, ear ache,   fever, chills, sweats, unintended wt loss or wt gain, classically pleuritic or exertional cp,  orthopnea pnd or arm/hand swelling  or leg swelling, presyncope, palpitations, abdominal pain, anorexia, nausea, vomiting, diarrhea  or change in bowel habits or change in bladder habits, change in stools or change in urine, dysuria, hematuria,  rash, arthralgias, visual complaints, headache, numbness, weakness or ataxia or problems with walking or coordination,  change in mood or  memory.        Current Meds  Medication Sig   acetaminophen (TYLENOL) 500 MG tablet Take 500 mg by mouth every 6 (six) hours as needed for moderate pain.   ALPRAZolam (XANAX) 0.5 MG tablet Take 0.5 mg by mouth daily as needed.   aspirin EC 81 MG tablet Take 1  tablet (81 mg total) by mouth daily.   Calcium Citrate-Vitamin D (CALCIUM CITRATE + D3 PO) Take 1 tablet by mouth daily.   Carboxymethylcellul-Glycerin (LUBRICATING EYE DROPS OP) Place 1 drop into both eyes 2 (two) times daily. As needed   carvedilol (COREG) 6.25 MG tablet TAKE 1 TABLET(6.25 MG) BY MOUTH TWICE DAILY WITH A MEAL   cyclobenzaprine (FLEXERIL) 5 MG tablet Take one tablet before bedtime daily  for lower back pain.   dapagliflozin propanediol (FARXIGA) 10 MG TABS tablet Take 1 tablet (10 mg total) by mouth daily.   Dextromethorphan-guaiFENesin (MUCUS RELIEF DM PO) Take by mouth.   Menthol, Topical Analgesic, (BIOFREEZE EX) Apply topically.   Multiple Vitamins-Minerals (SENIOR VITES PO) Take 1 tablet by mouth daily.   nitroGLYCERIN (NITROSTAT) 0.4 MG SL tablet Place 1 tablet (0.4 mg total) under the tongue every 5 (five) minutes as needed.   NON FORMULARY Fluc2% toe nail drops--apply topically once daily for toenail fungus   rosuvastatin (CRESTOR) 40 MG tablet TAKE 1 TABLET(40 MG) BY MOUTH DAILY   sacubitril-valsartan (ENTRESTO) 24-26 MG Take 1 tablet by mouth 2 (two) times daily.   sertraline (ZOLOFT) 50 MG tablet Take 50 mg by mouth daily.   spironolactone (ALDACTONE) 25 MG tablet Take 0.5 tablets (12.5 mg total) by mouth daily.   tamsulosin (FLOMAX) 0.4 MG CAPS capsule Take 1 capsule (0.4 mg total) by mouth daily.                   Past Medical History:  Diagnosis Date   Acute systolic heart failure (HCC)    COPD (chronic obstructive pulmonary disease) (HCC)    Enlarged prostate with lower urinary tract symptoms (LUTS)    Hyperlipidemia    Hypertension    Low serum testosterone level    PAF (paroxysmal atrial fibrillation) ([place])    Pneumonia 05/2014   Pyothorax without fistula (HCC)    Sciatica    Shortness of breath dyspnea    Solitary pulmonary nodule    STEMI (ST elevation myocardial infarction) ([place])    09/27/17 PCI/DES x1 mLAD, PTCA of the diag branch. EF 30%  [employer] at discharge.    Testicular hypofunction    Urinary frequency       Objective:    wts  01/10/2022      187 07/05/2021        184 12/29/2020      182  06/23/2020          173   05/15/2020        174 04/27/2020          176  01/21/2020        192   12/13/19 192 lb 3.2 oz (87.2 kg)  08/06/19 198 lb (89.8 kg)  06/24/19 198 lb (89.8 kg)    Vital signs reviewed  01/10/2022  - Note at rest 02 sats  99% on RA   General appearance:    amb wm/ min insp /exp rhonchi    HEENT : Oropharynx  clear   Nasal turbinates nl    NECK :  without  apparent JVD/ palpable Nodes/TM    LUNGS: no acc muscle use,  Min barrel  contour chest wall with bilateral distant insp/exp rhonchi  and  without cough on insp or exp maneuvers and min  Hyperresonant  to  percussion bilaterally    CV:  RRR  no s3 or murmur or increase in P2, and no edema   ABD:  soft and nontender with pos end  insp Hoover's  in the supine position.  No bruits or organomegaly appreciated   MS:  Nl gait/ ext warm without deformities Or obvious joint restrictions  calf tenderness, cyanosis or clubbing     SKIN: warm and dry without lesions    NEURO:  alert, approp, nl sensorium with  no motor or cerebellar deficits apparent.  Assessment

## 2022-01-10 NOTE — Patient Instructions (Signed)
Valsartan  80 mg twice daily in place of entresto   Change in mucus >trending toward  nastier, darker, bloodier > zpak   Please remember to go to the lab department   for your tests - we will call you with the results when they are available.      Please schedule a follow up office visit in 4 weeks, sooner if needed

## 2022-01-11 ENCOUNTER — Encounter: Payer: Self-pay | Admitting: Internal Medicine

## 2022-01-11 NOTE — Assessment & Plan Note (Addendum)
Onset 2016 assoc with empyema on R  - swallowing eval 11/2018  Oropharyngeal dysphagia s asp/ no restrictions  - off lisinopril  08/13/19 - Allergy profile 12/13/19  >  Eos 0.3 /  IgE  58 - 12/13/2019 empirically try gerd rx x 6 weeks > no better then stopped  - Sinus CT  02/18/20  Congested appearance of nasal mucosa  but  Negative for sinusitis - 05/15/2020 added back gerd rx x 6 weeks due to evening cough ? Related to meals > improved as did chest tightness  - 01/10/2022 try off ENTREST0 x 6 weeks   The sacubitrilat component of entresto is not and ACEi but it does lead to higher levels of bradykinin (the culprit in ACEi related cough) because it reduces Neprilysin based clearance of bradykinin. The typical symptoms are dry daytime cough (9% per PI) or complaints of a new sensation of globus or excess PNDS.   His chronic cough has become intractable since of entresto and EF improved to nl so will ask Dr Harl Bowie if ok to change to valsartan 80 bid with baseline labs today and in 6 weeks and freq bp checks as well    Discussed in detail all the  indications, usual  risks and alternatives  relative to the benefits with patient who agrees to proceed with Rx as outlined.             Each maintenance medication was reviewed in detail including emphasizing most importantly the difference between maintenance and prns and under what circumstances the prns are to be triggered using an action plan format where appropriate.  Total time for H and P, chart review, counseling, reviewing flutter device(s) and generating customized AVS unique to this office visit / same day charting = 32 min

## 2022-01-11 NOTE — Assessment & Plan Note (Signed)
See HRCT  02/18/20 Diffuse bronchial wall thickening with mild to moderate centrilobular and paraseptal emphysema. 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 - Flutter valve training 01/10/2022   rec max mucinex/ freq flutter rx and prn zpak for change in sputum

## 2022-01-11 NOTE — Assessment & Plan Note (Signed)
MM/Quit all smoking 2016 with dx of R empyema with chronic cough since then - Try off spiriva 05/21/2019  - PFT's  08/06/19  FEV1 2.60 (75 % ) ratio 0.64  p 14 % improvement from saba p 0 prior to study with DLCO  18.09 (66%) corrects to 3.14 (79%)  for alv volume and FV curve minimally concave  -  04/27/2020   Walked RA  approx   600 ft  @ fast pace  stopped due to  Sob with sats 99%    -  04/27/2020     alpha one AT phenotype  MM level 141  - 05/15/2020  After extensive coaching inhaler device,  effectiveness =    75% from a baseline of well < 50%  So ok to rechallenge with prn saba  Pt not convinced inhalers help sob or cough so leave off for now and focus on the cough, his greatest concern.

## 2022-01-12 LAB — BASIC METABOLIC PANEL
BUN/Creatinine Ratio: 20 (ref 10–24)
BUN: 28 mg/dL — ABNORMAL HIGH (ref 8–27)
CO2: 23 mmol/L (ref 20–29)
Calcium: 9.4 mg/dL (ref 8.6–10.2)
Chloride: 101 mmol/L (ref 96–106)
Creatinine, Ser: 1.41 mg/dL — ABNORMAL HIGH (ref 0.76–1.27)
Glucose: 92 mg/dL (ref 70–99)
Potassium: 5.5 mmol/L — ABNORMAL HIGH (ref 3.5–5.2)
Sodium: 140 mmol/L (ref 134–144)
eGFR: 52 mL/min/{1.73_m2} — ABNORMAL LOW (ref >59)

## 2022-01-12 LAB — CBC WITH DIFFERENTIAL/PLATELET
Basophils Absolute: 0.1 10*3/uL (ref 0.0–0.2)
Basos: 1 %
EOS (ABSOLUTE): 0.2 10*3/uL (ref 0.0–0.4)
Eos: 3 %
Hematocrit: 45.9 % (ref 37.5–51.0)
Hemoglobin: 15.1 g/dL (ref 13.0–17.7)
Immature Grans (Abs): 0 10*3/uL (ref 0.0–0.1)
Immature Granulocytes: 0 %
Lymphocytes Absolute: 1.3 10*3/uL (ref 0.7–3.1)
Lymphs: 20 %
MCH: 31.5 pg (ref 26.6–33.0)
MCHC: 32.9 g/dL (ref 31.5–35.7)
MCV: 96 fL (ref 79–97)
Monocytes Absolute: 0.8 10*3/uL (ref 0.1–0.9)
Monocytes: 12 %
Neutrophils Absolute: 4.2 10*3/uL (ref 1.4–7.0)
Neutrophils: 64 %
Platelets: 239 10*3/uL (ref 150–450)
RBC: 4.79 x10E6/uL (ref 4.14–5.80)
RDW: 12.2 % (ref 11.6–15.4)
WBC: 6.5 10*3/uL (ref 3.4–10.8)

## 2022-01-12 LAB — BRAIN NATRIURETIC PEPTIDE: BNP: 86.6 pg/mL (ref 0.0–100.0)

## 2022-01-14 ENCOUNTER — Other Ambulatory Visit: Payer: Self-pay

## 2022-01-14 DIAGNOSIS — R0609 Other forms of dyspnea: Secondary | ICD-10-CM

## 2022-01-15 ENCOUNTER — Other Ambulatory Visit: Payer: Self-pay | Admitting: Cardiology

## 2022-01-15 ENCOUNTER — Telehealth: Payer: Self-pay | Admitting: Orthopaedic Surgery

## 2022-01-21 DIAGNOSIS — R0609 Other forms of dyspnea: Secondary | ICD-10-CM | POA: Diagnosis not present

## 2022-01-22 LAB — BASIC METABOLIC PANEL
BUN/Creatinine Ratio: 20 (ref 10–24)
BUN: 28 mg/dL — ABNORMAL HIGH (ref 8–27)
CO2: 29 mmol/L (ref 20–29)
Calcium: 9.6 mg/dL (ref 8.6–10.2)
Chloride: 102 mmol/L (ref 96–106)
Creatinine, Ser: 1.39 mg/dL — ABNORMAL HIGH (ref 0.76–1.27)
Glucose: 76 mg/dL (ref 70–99)
Potassium: 5.3 mmol/L — ABNORMAL HIGH (ref 3.5–5.2)
Sodium: 142 mmol/L (ref 134–144)
eGFR: 53 mL/min/{1.73_m2} — ABNORMAL LOW (ref >59)

## 2022-01-27 DIAGNOSIS — B351 Tinea unguium: Secondary | ICD-10-CM | POA: Diagnosis not present

## 2022-02-14 ENCOUNTER — Encounter: Payer: Self-pay | Admitting: Internal Medicine

## 2022-02-16 ENCOUNTER — Other Ambulatory Visit: Payer: Self-pay

## 2022-02-16 DIAGNOSIS — J449 Chronic obstructive pulmonary disease, unspecified: Secondary | ICD-10-CM

## 2022-02-17 LAB — BASIC METABOLIC PANEL
BUN/Creatinine Ratio: 15 (ref 10–24)
BUN: 24 mg/dL (ref 8–27)
CO2: 24 mmol/L (ref 20–29)
Calcium: 9.5 mg/dL (ref 8.6–10.2)
Chloride: 104 mmol/L (ref 96–106)
Creatinine, Ser: 1.57 mg/dL — ABNORMAL HIGH (ref 0.76–1.27)
Glucose: 88 mg/dL (ref 70–99)
Potassium: 5.2 mmol/L (ref 3.5–5.2)
Sodium: 143 mmol/L (ref 134–144)
eGFR: 45 mL/min/{1.73_m2} — ABNORMAL LOW (ref >59)

## 2022-02-18 NOTE — Progress Notes (Signed)
Spoke with pt and notified of results per Dr. Wert. Pt verbalized understanding and denied any questions. 

## 2022-02-21 ENCOUNTER — Ambulatory Visit (INDEPENDENT_AMBULATORY_CARE_PROVIDER_SITE_OTHER): Payer: Medicare Other | Admitting: Internal Medicine

## 2022-02-21 ENCOUNTER — Other Ambulatory Visit: Payer: Self-pay | Admitting: Cardiology

## 2022-02-21 ENCOUNTER — Encounter: Payer: Self-pay | Admitting: Internal Medicine

## 2022-02-21 VITALS — BP 122/70 | HR 77 | Ht 72.0 in | Wt 191.2 lb

## 2022-02-21 DIAGNOSIS — J479 Bronchiectasis, uncomplicated: Secondary | ICD-10-CM

## 2022-02-21 DIAGNOSIS — R053 Chronic cough: Secondary | ICD-10-CM | POA: Diagnosis not present

## 2022-02-21 DIAGNOSIS — R0609 Other forms of dyspnea: Secondary | ICD-10-CM

## 2022-02-21 DIAGNOSIS — I2510 Atherosclerotic heart disease of native coronary artery without angina pectoris: Secondary | ICD-10-CM | POA: Diagnosis not present

## 2022-02-21 DIAGNOSIS — J449 Chronic obstructive pulmonary disease, unspecified: Secondary | ICD-10-CM

## 2022-02-21 MED ORDER — AZITHROMYCIN 250 MG PO TABS: ORAL_TABLET | ORAL | 11 refills | Status: DC

## 2022-02-21 MED ORDER — VALSARTAN 80 MG PO TABS: ORAL_TABLET | ORAL | 11 refills | Status: DC

## 2022-02-21 MED ORDER — VALSARTAN 80 MG PO TABS: ORAL_TABLET | ORAL | Status: DC

## 2022-02-21 NOTE — Assessment & Plan Note (Signed)
MM/Quit all smoking 2016 with dx of R empyema with chronic cough since then - Try off spiriva 05/21/2019  - PFT's  08/06/19  FEV1 2.60 (75 % ) ratio 0.64  p 14 % improvement from saba p 0 prior to study with DLCO  18.09 (66%) corrects to 3.14 (79%)  for alv volume and FV curve minimally concave  -  04/27/2020   Walked RA  approx   600 ft  @ fast pace  stopped due to  Sob with sats 99%    -  04/27/2020     alpha one AT phenotype  MM level 141  - 05/15/2020  After extensive coaching inhaler device,  effectiveness =    75% from a baseline of well < 50%  So ok to rechallenge with prn saba  Excellent ex tol s need for saba > no change in recs

## 2022-02-21 NOTE — Assessment & Plan Note (Signed)
ee HRCT  02/18/20 Diffuse bronchial wall thickening with mild to moderate centrilobular and paraseptal emphysema. 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 - Flutter valve training 01/10/2022   Well compensated at this point to where he's rarely using mucinex dm in max doses/ reviewed need to understand that bronchiectasis is not going away and he just needs to remember how to manage it with mucinex dm/ zpak /flutter valve

## 2022-02-21 NOTE — Patient Instructions (Addendum)
Reduce your valsartan to 80 mg daily   For cough/ congestion > mucinex dm 1200 mg twice daily as needed   Please remember to go to the lab department   for your tests - we will call you with the results when they are available.  Please schedule a follow up office visit in 4 weeks, sooner if needed

## 2022-02-21 NOTE — Progress Notes (Unsigned)
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 [name] for eval of COPD. Former Dr [name] pt. He is maintained on spiriva and does not use a rescue inhaler. He states his breathing is doing well currently.    Dyspnea:  3 x weekly treadmill x 50 min at 3.6 -4 and 4.5  Cough: comes and goes x years variably productive not noct  Sleep: does fine in recliner / can do flat in bed  SABA use: none  rec Try off spiriva to see if you notice any difference in your exercise tolerance or cough For cough > mucinex dm up to 1200 mg every 12 hours as needed  Please schedule a follow up visit in 2 months at [name] office with PFT's first if possible      12/13/2019  f/u ov/Takiah Maiden re:  GOLD II barely / maint on bevespi and off lisinopril  08/13/19 Chief Complaint  Patient presents with   Follow-up    productive cough with thick, yellow phlegm  Dyspnea:  Treadmill x up to 55 min 4pm x 4%  Cough: ever since empyema surgery but changed early September 2021 better p zpak Sleeping: in recliner due to back / minimal cough  SABA use: none  02: none  rec Breztri Take 2 puffs first thing in am and then another 2 puffs about 12 hours later x 2 weeks and stop and then restart bevespi only if notice deterioration Pantoprazole (protonix) 40 mg   Take  30-60 min before first meal of the day and Pepcid (famotidine)  20 mg one after  Supper until return to office  GERD diet  Please remember to go to the lab and x-ray department at [facility]   for your tests - we will call you with the results  when they are available. Please schedule a follow up office visit in 6 weeks, call sooner if needed    01/21/2020  f/u ov/[name] [name] re: COPD GOLD II /  cough x 2016 when dx with R empyema  Chief Complaint  Patient presents with   Follow-up    productive cough with pinkish color phlegm  Dyspnea:  No change ex tol treadmill on or off breztri vs bevespi vs nothin  Cough: actually better while on treadmill/ still pink tinged variably on baby asa daily  Sleeping: worse on L side down  SABA use: none  02: none  rec For changed mucus from your normal go ahead and use zpak  If mucus gets really bloody, stop the baby aspirin until better  Ok to stop the reflux meds  - you may need to take pepcid 20 mg twice daily x one week then one daily x week and stop   schedule your CT chest and sinus and the results > bronchiectasis/ no sinusitis   07/05/2021  f/u ov/[name] office/Esiquio Boesen re: GOLD 2/ bronchiectasis maint on zpak prn   Chief Complaint  Patient presents with   Follow-up    SOB and cough are about the  same since last ov.   wants refill for z pak  Dyspnea:  treadmill same tol/sats  Cough: sporadic but this winter was the worst p entresto started / been with g dm but not on max doses / lots of am am mucus typically  Sleeping: bed blocks fine SABA use: none  02: none  Covid status: vax x 5  Lung cancer screening: not sure he's eligible Rec I will be referring you for lung cancer screening  - it actually isn't due until 03/11/22  The sacubitrilat component of entresto is not and ACEi but it does lead to higher levels of bradykinin Refill zpak  - should turn mucus clear or less dark Max dose guaifensin is 1200 mg every 12 hours and use the flutter valve if needed  Please schedule a follow up visit in 6  months but call sooner if needed    01/10/2022  f/u ov/[place] office/[name] [name] re: GOLD 2/ bronchiectasis  maint on zpak prn last took it early summer 2023   Chief Complaint  Patient  presents with   Follow-up    Not coughing up as much sputum as he used to but still coughing a lot    Dyspnea:  less energy to do treadmill  Cough: sporadic daytime/ in am mucus is yellow on mucinex dm qod  Sleeping: better on Right side  SABA use: none / doesn't think theyhelp  02: none  Rec Valsartan  80 mg twice daily in place of entresto  Change in mucus >trending toward  nastier, darker, bloodier > zpak  Please remember to go to the lab department   for your tests - we will call you with the results when they are available.       02/21/2022  f/u ov/[place] office/[name] [name] re: GOLD 2/ bronchiectasis maint on no inhalers  Chief Complaint  Patient presents with   Follow-up    Pt states that his breathing is the same as LOV. Wants to discuss recent changes to medications  Zpak 2 weeks prior due to blood tinge sputum > cleared with min discoloration sice  Mucinex DM not using much at all  Dyspnea:  treadmill 45 min 3 x weekly x 45 min / 4% incline/ 4 mph Cough: worse first thing in am/ nothing purulent or excessive at this point Sleeping: slt elevated /10 degrees  SABA use: none  02: none  Covid status: vax max     No obvious day to day or daytime variability or assoc excess/ purulent sputum or mucus plugs or   cp or chest tightness, subjective wheeze or overt sinus or hb symptoms.   Sleeping  without nocturnal  or early am exacerbation  of respiratory  c/o's or need for noct saba. Also denies any obvious fluctuation of symptoms with weather or environmental changes or other aggravating or alleviating factors except as outlined above   No unusual exposure hx or h/o childhood pna/ asthma or knowledge of premature birth.  Current Allergies, Complete Past Medical History, Past Surgical History, Family History, and Social History were reviewed in [employer] record.  ROS  The following are not active complaints unless bolded Hoarseness, sore throat, dysphagia,  dental problems, itching, sneezing,  nasal congestion or discharge of excess mucus or purulent secretions, ear ache,   fever, chills, sweats, unintended wt loss or wt gain, classically pleuritic or exertional cp,  orthopnea pnd or arm/hand swelling  or leg swelling, presyncope, palpitations, abdominal pain, anorexia, nausea, vomiting, diarrhea  or  change in bowel habits or change in bladder habits, change in stools or change in urine, dysuria, hematuria,  rash, arthralgias, visual complaints, headache, numbness, weakness or ataxia or problems with walking or coordination,  change in mood or  memory.        Current Meds  Medication Sig   acetaminophen (TYLENOL) 500 MG tablet Take 500 mg by mouth every 6 (six) hours as needed for moderate pain.   ALPRAZolam (XANAX) 0.5 MG tablet Take 0.5 mg by mouth daily as needed.   aspirin EC 81 MG tablet Take 1 tablet (81 mg total) by mouth daily.   Calcium Citrate-Vitamin D (CALCIUM CITRATE + D3 PO) Take 1 tablet by mouth daily.   Carboxymethylcellul-Glycerin (LUBRICATING EYE DROPS OP) Place 1 drop into both eyes 2 (two) times daily. As needed   carvedilol (COREG) 6.25 MG tablet TAKE 1 TABLET(6.25 MG) BY MOUTH TWICE DAILY WITH A MEAL   cyclobenzaprine (FLEXERIL) 5 MG tablet TAKE 1 TABLET BY MOUTH EVERY NIGHT AT BEDTIME FOR LOWER BACK PAIN   dapagliflozin propanediol (FARXIGA) 10 MG TABS tablet Take 1 tablet (10 mg total) by mouth daily.   Dextromethorphan-guaiFENesin (MUCUS RELIEF DM PO) Take by mouth.   Menthol, Topical Analgesic, (BIOFREEZE EX) Apply topically.   Multiple Vitamins-Minerals (SENIOR VITES PO) Take 1 tablet by mouth daily.   nitroGLYCERIN (NITROSTAT) 0.4 MG SL tablet Place 1 tablet (0.4 mg total) under the tongue every 5 (five) minutes as needed.   NON FORMULARY Fluc2% toe nail drops--apply topically once daily for toenail fungus   rosuvastatin (CRESTOR) 40 MG tablet TAKE 1 TABLET(40 MG) BY MOUTH DAILY   sertraline (ZOLOFT) 50 MG tablet Take 50  mg by mouth daily.   tamsulosin (FLOMAX) 0.4 MG CAPS capsule Take 1 capsule (0.4 mg total) by mouth daily.                    Past Medical History:  Diagnosis Date   Acute systolic heart failure (HCC)    COPD (chronic obstructive pulmonary disease) (HCC)    Enlarged prostate with lower urinary tract symptoms (LUTS)    Hyperlipidemia    Hypertension    Low serum testosterone level    PAF (paroxysmal atrial fibrillation) ([place])    Pneumonia 05/2014   Pyothorax without fistula (HCC)    Sciatica    Shortness of breath dyspnea    Solitary pulmonary nodule    STEMI (ST elevation myocardial infarction) ([place])    09/27/17 PCI/DES x1 mLAD, PTCA of the diag branch. EF 30% Lifevest at discharge.    Testicular hypofunction    Urinary frequency       Objective:    wts  02/21/2022        191 01/10/2022      187 07/05/2021        184 12/29/2020      182  06/23/2020          173   05/15/2020        174 04/27/2020          176  01/21/2020        192   12/13/19 192 lb 3.2 oz (87.2 kg)  08/06/19 198 lb (89.8 kg)  06/24/19 198 lb (89.8 kg)    Vital signs reviewed  02/21/2022  - Note at rest 02 sats  96% on RA   General appearance:    amb pleasant wm      HEENT : wearing mask   NECK :  without  apparent JVD/ palpable Nodes/TM    LUNGS: no acc muscle use,  Min barrel  contour chest wall with bilateral  slightly decreased bs and min exp rhonchi  and  without cough on insp or exp maneuvers and min  Hyperresonant  to  percussion bilaterally    CV:  RRR  no s3 or murmur or increase in P2, and no edema   ABD:  soft and nontender with pos end  insp Hoover's  in the supine position.  No bruits or organomegaly appreciated   MS:  Nl gait/ ext warm without deformities Or obvious joint restrictions  calf tenderness, cyanosis or clubbing     SKIN: warm and dry without lesions    NEURO:  alert, approp, nl sensorium with  no motor or cerebellar deficits apparent.                Labs 02/21/2022     BNP    =  95.9  (as 86.6 while on entresto 1 m prior to OV)          Assessment

## 2022-02-22 LAB — BRAIN NATRIURETIC PEPTIDE: BNP: 95.9 pg/mL (ref 0.0–100.0)

## 2022-02-23 ENCOUNTER — Encounter: Payer: Self-pay | Admitting: Internal Medicine

## 2022-02-23 ENCOUNTER — Encounter: Payer: Self-pay | Admitting: Cardiology

## 2022-02-23 NOTE — Assessment & Plan Note (Signed)
Onset 2016 assoc with empyema on R  - swallowing eval 11/2018  Oropharyngeal dysphagia s asp/ no restrictions  - off lisinopril  08/13/19 - Allergy profile 12/13/19  >  Eos 0.3 /  IgE  58 - 12/13/2019 empirically try gerd rx x 6 weeks > no better then stopped  -   Sinus CT  02/18/20  Congested appearance of nasal mucosa  but  Negative for sinusitis - 05/15/2020 added back gerd rx x 6 weeks due to evening cough ? Related to meals> improved as did chest tightness  - 01/10/2022 try off ENTREST0 x 6 weeks (See chf) > no def improvement as of 02/21/2022 but not following his other instructions re use of mucinex dm for bronchiectasis (see separate a/p)   F/u in 4 weeks to regroup

## 2022-02-23 NOTE — Assessment & Plan Note (Addendum)
Lab Results  Component Value Date   CREATININE 1.57 (H) 02/16/2022   CREATININE 1.39 (H) 01/21/2022   CREATININE 1.41 (H) 01/10/2022     No change off entresto with BNP still < 100 so rec leave off until next ov then add back if clinically needed and have reached a plateau in terms of benefit for chronic cough.   Will reduce valstartan to 80 mg daily due to creat up a bit  and f/u in 4 weeks          Each maintenance medication was reviewed in detail including emphasizing most importantly the difference between maintenance and prns and under what circumstances the prns are to be triggered using an action plan format where appropriate.  Total time for H and P, chart review, counseling, reviewing flutter device(s) and generating customized AVS unique to this office visit / same day charting  > 30 min for multiple  refractory respiratory  symptoms of uncertain etiology

## 2022-03-04 DIAGNOSIS — N1831 Chronic kidney disease, stage 3a: Secondary | ICD-10-CM | POA: Diagnosis not present

## 2022-03-04 DIAGNOSIS — I2510 Atherosclerotic heart disease of native coronary artery without angina pectoris: Secondary | ICD-10-CM | POA: Diagnosis not present

## 2022-03-04 DIAGNOSIS — R7303 Prediabetes: Secondary | ICD-10-CM | POA: Diagnosis not present

## 2022-03-04 DIAGNOSIS — Z125 Encounter for screening for malignant neoplasm of prostate: Secondary | ICD-10-CM | POA: Diagnosis not present

## 2022-03-09 DIAGNOSIS — F331 Major depressive disorder, recurrent, moderate: Secondary | ICD-10-CM | POA: Diagnosis not present

## 2022-03-09 DIAGNOSIS — I509 Heart failure, unspecified: Secondary | ICD-10-CM | POA: Diagnosis not present

## 2022-03-09 DIAGNOSIS — R0600 Dyspnea, unspecified: Secondary | ICD-10-CM | POA: Diagnosis not present

## 2022-03-09 DIAGNOSIS — B356 Tinea cruris: Secondary | ICD-10-CM | POA: Diagnosis not present

## 2022-03-09 DIAGNOSIS — F5105 Insomnia due to other mental disorder: Secondary | ICD-10-CM | POA: Diagnosis not present

## 2022-03-09 DIAGNOSIS — J449 Chronic obstructive pulmonary disease, unspecified: Secondary | ICD-10-CM | POA: Diagnosis not present

## 2022-03-09 DIAGNOSIS — E875 Hyperkalemia: Secondary | ICD-10-CM | POA: Diagnosis not present

## 2022-03-09 DIAGNOSIS — R053 Chronic cough: Secondary | ICD-10-CM | POA: Diagnosis not present

## 2022-03-09 DIAGNOSIS — I5022 Chronic systolic (congestive) heart failure: Secondary | ICD-10-CM | POA: Diagnosis not present

## 2022-03-09 DIAGNOSIS — I2510 Atherosclerotic heart disease of native coronary artery without angina pectoris: Secondary | ICD-10-CM | POA: Diagnosis not present

## 2022-03-09 DIAGNOSIS — Z0001 Encounter for general adult medical examination with abnormal findings: Secondary | ICD-10-CM | POA: Diagnosis not present

## 2022-03-09 DIAGNOSIS — R7303 Prediabetes: Secondary | ICD-10-CM | POA: Diagnosis not present

## 2022-03-24 DIAGNOSIS — H04123 Dry eye syndrome of bilateral lacrimal glands: Secondary | ICD-10-CM | POA: Diagnosis not present

## 2022-03-25 ENCOUNTER — Encounter: Payer: Self-pay | Admitting: Internal Medicine

## 2022-03-25 ENCOUNTER — Ambulatory Visit (INDEPENDENT_AMBULATORY_CARE_PROVIDER_SITE_OTHER): Payer: Medicare Other | Admitting: Internal Medicine

## 2022-03-25 VITALS — BP 128/68 | HR 66 | Temp 98.4°F | Ht 72.0 in | Wt 190.6 lb

## 2022-03-25 DIAGNOSIS — J479 Bronchiectasis, uncomplicated: Secondary | ICD-10-CM

## 2022-03-25 DIAGNOSIS — J449 Chronic obstructive pulmonary disease, unspecified: Secondary | ICD-10-CM

## 2022-03-25 DIAGNOSIS — R0609 Other forms of dyspnea: Secondary | ICD-10-CM | POA: Diagnosis not present

## 2022-03-25 DIAGNOSIS — R053 Chronic cough: Secondary | ICD-10-CM | POA: Diagnosis not present

## 2022-03-25 MED ORDER — METHYLPREDNISOLONE ACETATE 80 MG/ML IJ SUSP
120.0000 mg | Freq: Once | INTRAMUSCULAR | Status: AC
  Administered 2022-03-25: 120 mg via INTRAMUSCULAR

## 2022-03-25 MED ORDER — BUDESONIDE-FORMOTEROL FUMARATE 80-4.5 MCG/ACT IN AERO: INHALATION_SPRAY | RESPIRATORY_TRACT | 12 refills | Status: DC

## 2022-03-25 MED ORDER — AZITHROMYCIN 250 MG PO TABS: ORAL_TABLET | ORAL | 2 refills | Status: DC

## 2022-03-25 NOTE — Patient Instructions (Addendum)
For cough/ congestion > mucinex dm 1200 mg twice daily as needed and use flutter valve as much as you can   Make appt to see Benjamine Mola to get his opinion re the source of the cough   Change zithromax to 250 mg to one daily for the next 30 days and refill if you feel effective especially for the morning cough   Depomedorl 120 mg IM   Symbicort 80 Take 2 puffs first thing in am and then another 2 puffs about 12 hours later.    Work on inhaler technique:  relax and gently blow all the way out then take a nice smooth full deep breath back in, triggering the inhaler at same time you start breathing in.  Hold breath in for at least  5 seconds if you can. Blow out symbicort  thru nose. Rinse and gargle with water when done.  If mouth or throat bother you at all,  try brushing teeth/gums/tongue with arm and hammer toothpaste/ make a slurry and gargle and spit out.   Please schedule a follow up office visit in 4 weeks, sooner if needed  with your inhaler

## 2022-03-25 NOTE — Progress Notes (Unsigned)
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 [name] for eval of COPD. Former Dr [name] pt. He is maintained on spiriva and does not use a rescue inhaler. He states his breathing is doing well currently.    Dyspnea:  3 x weekly treadmill x 50 min at 3.6 -4 and 4.5  Cough: comes and goes x years variably productive not noct  Sleep: does fine in recliner / can do flat in bed  SABA use: none  rec Try off spiriva to see if you notice any difference in your exercise tolerance or cough For cough > mucinex dm up to 1200 mg every 12 hours as needed  Please schedule a follow up visit in 2 months at [place] office with PFT's first if possible      12/13/2019  f/u ov/Acxel Dingee re:  GOLD II barely / maint on bevespi and off lisinopril  08/13/19 Chief Complaint  Patient presents with   Follow-up    productive cough with thick, yellow phlegm  Dyspnea:  Treadmill x up to 55 min 4pm x 4%  Cough: ever since empyema surgery but changed early September 2021 better p zpak Sleeping: in recliner due to back / minimal cough  SABA use: none  02: none  rec Breztri Take 2 puffs first thing in am and then another 2 puffs about 12 hours later x 2 weeks and stop and then restart bevespi only if notice deterioration Pantoprazole (protonix) 40 mg   Take  30-60 min before first meal of the day and Pepcid (famotidine)  20 mg one after  Supper until return to office  GERD diet  Please remember to go to the lab and x-ray department at [facility]   for your tests - we will call you with the results  when they are available. Please schedule a follow up office visit in 6 weeks, call sooner if needed    01/21/2020  f/u ov/[name] [name] re: COPD GOLD II /  cough x 2016 when dx with R empyema  Chief Complaint  Patient presents with   Follow-up    productive cough with pinkish color phlegm  Dyspnea:  No change ex tol treadmill on or off breztri vs bevespi vs nothin  Cough: actually better while on treadmill/ still pink tinged variably on baby asa daily  Sleeping: worse on L side down  SABA use: none  02: none  rec For changed mucus from your normal go ahead and use zpak  If mucus gets really bloody, stop the baby aspirin until better  Ok to stop the reflux meds  - you may need to take pepcid 20 mg twice daily x one week then one daily x week and stop   schedule your CT chest and sinus and the results > bronchiectasis/ no sinusitis   07/05/2021  f/u ov/[name] office/[name] [name] re: GOLD 2/ bronchiectasis maint on zpak prn   Chief Complaint  Patient presents with   Follow-up    SOB and cough are about the  same since last ov.   wants refill for z pak  Dyspnea:  treadmill same tol/sats  Cough: sporadic but this winter was the worse p entresto started / been with mucinex dm but not on max doses / lots of am am mucus typically  Sleeping: bed blocks fine SABA use: none  02: none  Covid status: vax x 5  Lung cancer screening: not sure he's eligible Rec I will be referring you for lung cancer screening  - it actually isn't due until 03/11/22  The sacubitrilat component of entresto is not and ACEi but it does lead to higher levels of bradykinin Refill zpak  - should turn mucus clear or less dark Max dose guaifensin is 1200 mg every 12 hours and use the flutter valve if needed  Please schedule a follow up visit in 6  months but call sooner if needed    01/10/2022  f/u ov/[place] office/[name] [name] re: GOLD 2/ bronchiectasis  maint on zpak prn last took it early summer 2023   Chief Complaint  Patient  presents with   Follow-up    Not coughing up as much sputum as he used to but still coughing a lot    Dyspnea:  less energy to do treadmill  Cough: sporadic daytime/ in am mucus is yellow on mucinex dm qod  Sleeping: better on Right side  SABA use: none / doesn't think theyhelp  02: none  Rec Valsartan  80 mg twice daily in place of entresto  Change in mucus >trending toward  nastier, darker, bloodier > zpak  Please remember to go to the lab department   for your tests - we will call you with the results when they are available.       02/21/2022  f/u ov/[place] office/[name] [name] re: GOLD 2/ bronchiectasis maint on no inhalers  Chief Complaint  Patient presents with   Follow-up    Pt states that his breathing is the same as LOV. Wants to discuss recent changes to medications  Zpak 2 weeks prior due to blood tinge sputum > cleared with min discoloration sice  Mucinex DM not using much at all  Dyspnea:  treadmill 45 min 3 x weekly  4% incline/ 4 mph Cough: worse first thing in am/ nothing purulent or excessive at this point Sleeping: slt elevated /10 degrees  SABA use: none  02: none  Covid status: vax max  Rec Reduce your valsartan to 80 mg daily  For cough/ congestion > mucinex dm 1200 mg twice daily as needed  Please remember to go to the lab department   for your tests - we will call you with the results when they are available.    03/25/2022  f/u ov/[place] office/[name] [name] re: cough x 2016  maint on ***  Chief Complaint  Patient presents with   Follow-up    Has pink ish/ darker colored sputum  Breathing same since last ov    Dyspnea:  no change in ex tol on treadmill  Cough: worse first thing in am more pink > better p zpak  Sleeping: sleep at 10 degrees on bed blocks  SABA use: none  02: none      No obvious day to day or daytime variability or assoc excess/ purulent sputum or mucus plugs or hemoptysis or cp or chest tightness, subjective wheeze or overt sinus or hb  symptoms.   *** without nocturnal  or early am exacerbation  of respiratory  c/o's or need for noct saba. Also denies  any obvious fluctuation of symptoms with weather or environmental changes or other aggravating or alleviating factors except as outlined above   No unusual exposure hx or h/o childhood pna/ asthma or knowledge of premature birth.  Current Allergies, Complete Past Medical History, Past Surgical History, Family History, and Social History were reviewed in [employer] record.  ROS  The following are not active complaints unless bolded Hoarseness, sore throat, dysphagia, dental problems, itching, sneezing,  nasal congestion or discharge of excess mucus or purulent secretions, ear ache,   fever, chills, sweats, unintended wt loss or wt gain, classically pleuritic or exertional cp,  orthopnea pnd or arm/hand swelling  or leg swelling, presyncope, palpitations, abdominal pain, anorexia, nausea, vomiting, diarrhea  or change in bowel habits or change in bladder habits, change in stools or change in urine, dysuria, hematuria,  rash, arthralgias, visual complaints, headache, numbness, weakness or ataxia or problems with walking or coordination,  change in mood or  memory.        Current Meds  Medication Sig   acetaminophen (TYLENOL) 500 MG tablet Take 500 mg by mouth every 6 (six) hours as needed for moderate pain.   ALPRAZolam (XANAX) 0.5 MG tablet Take 0.5 mg by mouth daily as needed.   aspirin EC 81 MG tablet Take 1 tablet (81 mg total) by mouth daily.   Calcium Citrate-Vitamin D (CALCIUM CITRATE + D3 PO) Take 1 tablet by mouth daily.   Carboxymethylcellul-Glycerin (LUBRICATING EYE DROPS OP) Place 1 drop into both eyes 2 (two) times daily. As needed   carvedilol (COREG) 6.25 MG tablet TAKE 1 TABLET(6.25 MG) BY MOUTH TWICE DAILY WITH A MEAL   cyclobenzaprine (FLEXERIL) 5 MG tablet TAKE 1 TABLET BY MOUTH EVERY NIGHT AT BEDTIME FOR LOWER BACK PAIN   dapagliflozin  propanediol (FARXIGA) 10 MG TABS tablet Take 1 tablet (10 mg total) by mouth daily.   Dextromethorphan-guaiFENesin (MUCUS RELIEF DM PO) Take by mouth.   Menthol, Topical Analgesic, (BIOFREEZE EX) Apply topically.   Multiple Vitamins-Minerals (SENIOR VITES PO) Take 1 tablet by mouth daily.   nitroGLYCERIN (NITROSTAT) 0.4 MG SL tablet Place 1 tablet (0.4 mg total) under the tongue every 5 (five) minutes as needed.   NON FORMULARY Fluc2% toe nail drops--apply topically once daily for toenail fungus   rosuvastatin (CRESTOR) 40 MG tablet TAKE 1 TABLET(40 MG) BY MOUTH DAILY   sertraline (ZOLOFT) 50 MG tablet Take 50 mg by mouth daily.   tamsulosin (FLOMAX) 0.4 MG CAPS capsule Take 1 capsule (0.4 mg total) by mouth daily.   valsartan (DIOVAN) 80 MG tablet One daily                Past Medical History:  Diagnosis Date   Acute systolic heart failure (HCC)    COPD (chronic obstructive pulmonary disease) (HCC)    Enlarged prostate with lower urinary tract symptoms (LUTS)    Hyperlipidemia    Hypertension    Low serum testosterone level    PAF (paroxysmal atrial fibrillation) ([place])    Pneumonia 05/2014   Pyothorax without fistula (HCC)    Sciatica    Shortness of breath dyspnea    Solitary pulmonary nodule    STEMI (ST elevation myocardial infarction) ([place])    09/27/17 PCI/DES x1 mLAD, PTCA of the diag branch. EF 30% Lifevest at discharge.    Testicular hypofunction    Urinary frequency       Objective:    wts  03/25/2022           ***  02/21/2022        191 01/10/2022      187 07/05/2021        184 12/29/2020      182  06/23/2020          173   05/15/2020        174 04/27/2020          176  01/21/2020        192   12/13/19 192 lb 3.2 oz (87.2 kg)  08/06/19 198 lb (89.8 kg)  06/24/19 198 lb (89.8 kg)     Vital signs reviewed  03/25/2022  - Note at rest 02 sats  ***% on ***   General appearance:    ***      Min bar***    now end exp wheeze/cough                  Assessment

## 2022-03-26 NOTE — Assessment & Plan Note (Signed)
Onset 2016 assoc with empyema on R  - swallowing eval 11/2018  Oropharyngeal dysphagia s asp/ no restrictions  - off lisinopril  08/13/19 - Allergy profile 12/13/19  >  Eos 0.3 /  IgE  58 - 12/13/2019 empirically try gerd rx x 6 weeks > no better then stopped  -   Sinus CT  02/18/20  Congested appearance of nasal mucosa  but  Negative for sinusitis - 05/15/2020 added back gerd rx x 6 weeks due to evening cough ? Related to meals> improved as did chest tightness  - 01/10/2022 try off ENTREST0 x 6 weeks (See chf) > no def improvement as of 02/21/2022 but not following his other instructions re use of mucinex dm for bronchiectasis (see separate a/p)  - 03/25/2022 trial of mucinex dm/ flutter valve/ daily zmax for bronchiectasis and stay off entresto for now

## 2022-03-26 NOTE — Assessment & Plan Note (Signed)
See HRCT  02/18/20 Diffuse bronchial wall thickening with mild to moderate centrilobular and paraseptal emphysema. 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 - Flutter valve training 01/10/2022  plus daily zmax trial   Assoc with traces of heme reported and at risk of recurrent hemoptysis so try zmax as above daily x 30 days and regroup then          Each maintenance medication was reviewed in detail including emphasizing most importantly the difference between maintenance and prns and under what circumstances the prns are to be triggered using an action plan format where appropriate.  Total time for H and P, chart review, counseling, reviewing hfa/flutter  device(s) and generating customized AVS unique to this office visit / same day charting = > 30 min for multiple  refractory respiratory  symptoms of uncertain etiology

## 2022-03-26 NOTE — Assessment & Plan Note (Signed)
MM/Quit all smoking 2016 with dx of R empyema with chronic cough since then - Try off spiriva 05/21/2019  - PFT's  08/06/19  FEV1 2.60 (75 % ) ratio 0.64  p 14 % improvement from saba p 0 prior to study with DLCO  18.09 (66%) corrects to 3.14 (79%)  for alv volume and FV curve minimally concave  -  04/27/2020   Walked RA  approx   600 ft  @ fast pace  stopped due to  Sob with sats 99%    -  04/27/2020     alpha one AT phenotype  MM level 141  - 05/15/2020  After extensive coaching inhaler device,  effectiveness =    75% from a baseline of well < 50%  So ok to rechallenge with prn saba  Appears to have mild AB flare - though maint rx did not help his doe, it might help his cough but since there is assoc bronchiectasis rec symb 80 2bid    - The proper method of use, as well as anticipated side effects, of a metered-dose inhaler were discussed and demonstrated to the patient using teach back method.

## 2022-03-28 ENCOUNTER — Encounter: Payer: Self-pay | Admitting: Internal Medicine

## 2022-03-28 NOTE — Telephone Encounter (Signed)
  You asked me to make an appointment with Dr. Benjamine Mola to determine if there are any throat issues that might be related to my chronic cough.  When I called Dr. Deeann Saint office, I was told that Dr. Benjamine Mola is no longer seeing patients with throat issues.  (I have seen Dr. Benjamine Mola previously regarding ear issues.)           Is there an ENT person that your office can recommend for this evaluation?  I would prefer someone in [place], but I can do [place] or [place].          Thanks        [name] [name]    Dr.Wert please advise, is there another ENT you specifically want patient to see in the office?

## 2022-03-28 NOTE — Telephone Encounter (Signed)
Refer to [facility]  dx upper airway cough syndrome

## 2022-03-30 DIAGNOSIS — E875 Hyperkalemia: Secondary | ICD-10-CM | POA: Diagnosis not present

## 2022-03-30 DIAGNOSIS — I2510 Atherosclerotic heart disease of native coronary artery without angina pectoris: Secondary | ICD-10-CM | POA: Diagnosis not present

## 2022-04-13 ENCOUNTER — Other Ambulatory Visit: Payer: Self-pay | Admitting: Cardiology

## 2022-04-14 ENCOUNTER — Other Ambulatory Visit: Payer: Self-pay | Admitting: Cardiology

## 2022-05-03 DIAGNOSIS — B351 Tinea unguium: Secondary | ICD-10-CM | POA: Diagnosis not present

## 2022-05-05 ENCOUNTER — Ambulatory Visit (INDEPENDENT_AMBULATORY_CARE_PROVIDER_SITE_OTHER): Payer: Medicare Other | Admitting: Internal Medicine

## 2022-05-05 ENCOUNTER — Encounter: Payer: Self-pay | Admitting: Internal Medicine

## 2022-05-05 VITALS — BP 132/70 | HR 72 | Ht 72.0 in | Wt 193.6 lb

## 2022-05-05 DIAGNOSIS — J479 Bronchiectasis, uncomplicated: Secondary | ICD-10-CM | POA: Diagnosis not present

## 2022-05-05 DIAGNOSIS — J449 Chronic obstructive pulmonary disease, unspecified: Secondary | ICD-10-CM | POA: Diagnosis not present

## 2022-05-05 NOTE — Assessment & Plan Note (Signed)
MM/Quit all smoking 2016 with dx of R empyema with chronic cough since then - Try off spiriva 05/21/2019  - PFT's  08/06/19  FEV1 2.60 (75 % ) ratio 0.64  p 14 % improvement from saba p 0 prior to study with DLCO  18.09 (66%) corrects to 3.14 (79%)  for alv volume and FV curve minimally concave  -  04/27/2020   Walked RA  approx   600 ft  @ fast pace  stopped due to  Sob with sats 99%    -  04/27/2020     alpha one AT phenotype  MM level 141  - 05/15/2020  After extensive coaching inhaler device,  effectiveness =    75% from a baseline of well < 50%  So ok to rechallenge with prn saba - 03/25/22 rx symbicort 80 2bid  - 05/05/2022  After extensive coaching inhaler device,  effectiveness =    80% (late trigger) > continue symbicort 80 2bid   Overall better > no change rx

## 2022-05-05 NOTE — Progress Notes (Signed)
[name] [name], male    DOB: 1945/05/14    MRN: [id number]  Brief patient profile:  89  yowm electrical engineer MM/quit all smoking 2016  with R Empyema maint on spriva dpi /ACEi with variable cough but no doe since 2016 previously followed by Dr [name] [name] referred to pulmonary clinic 05/21/2019 by Dr   [name] [name]    History of Present Illness  05/21/2019  Pulmonary/ 1st office eval/[name] [name]  Chief Complaint  Patient presents with   Pulmonary Consult    Referred by Dr. Holly [name] for eval of COPD. Former Dr [name] pt. He is maintained on spiriva and does not use a rescue inhaler. He states his breathing is doing well currently.    Dyspnea:  3 x weekly treadmill x 50 min at 3.6 -4 and 4.5  Cough: comes and goes x years variably productive not noct  Sleep: does fine in recliner / can do flat in bed  SABA use: none  rec Try off spiriva to see if you notice any difference in your exercise tolerance or cough For cough > mucinex dm up to 1200 mg every 12 hours as needed  Please schedule a follow up visit in 2 months at [place] office with PFT's first if possible      12/13/2019  f/u ov/[name] [name] re:  GOLD II barely / maint on bevespi and off lisinopril  08/13/19 Chief Complaint  Patient presents with   Follow-up    productive cough with thick, yellow phlegm  Dyspnea:  Treadmill x up to 55 min 4pm x 4%  Cough: ever since empyema surgery but changed early September 2021 better p zpak Sleeping: in recliner due to back / minimal cough  SABA use: none  02: none  rec Breztri Take 2 puffs first thing in am and then another 2 puffs about 12 hours later x 2 weeks and stop and then restart bevespi only if notice deterioration Pantoprazole (protonix) 40 mg   Take  30-60 min before first meal of the day and Pepcid (famotidine)  20 mg one after  Supper until return to office  GERD diet  Please remember to go to the lab and [facility] department at [facility]   for your tests - we will call you with the results  when they are available. Please schedule a follow up office visit in 6 weeks, call sooner if needed      01/21/2020  f/u ov/[name] [name] re: COPD GOLD II /  cough x 2016 when dx with R empyema  Chief Complaint  Patient presents with   Follow-up    productive cough with pinkish color phlegm  Dyspnea:  No change ex tol treadmill on or off breztri vs bevespi vs nothin  Cough: actually better while on treadmill/ still pink tinged variably on baby asa daily  Sleeping: worse on L side down  SABA use: none  02: none  rec For changed mucus from your normal go ahead and use zpak  If mucus gets really bloody, stop the baby aspirin until better  Ok to stop the reflux meds  - you may need to take pepcid 20 mg twice daily x one week then one daily x week and stop   schedule your CT chest and sinus and the results > bronchiectasis/ no sinusitis   07/05/2021  f/u ov/[name] [name] office/[name] [name] re: GOLD 2/ bronchiectasis maint on zpak prn   Chief Complaint  Patient presents with   Follow-up    SOB and cough are about  the same since last ov.   wants refill for z pak  Dyspnea:  treadmill same tol/sats  Cough: sporadic but this winter was the worse p entresto started / been with mucinex dm but not on max doses / lots of am am mucus typically  Sleeping: bed blocks fine SABA use: none  02: none  Covid status: vax x 5  Lung cancer screening: not sure he's eligible Rec I will be referring you for lung cancer screening  - it actually isn't due until 03/11/22  The sacubitrilat component of entresto is not and ACEi but it does lead to higher levels of bradykinin Refill zpak  - should turn mucus clear or less dark Max dose guaifensin is 1200 mg every 12 hours and use the flutter valve if needed  Please schedule a follow up visit in 6  months but call sooner if needed    01/10/2022  f/u ov/[name] [name] office/[name] [name] re: GOLD 2/ bronchiectasis  maint on zpak prn last took it early summer 2023   Chief Complaint   Patient presents with   Follow-up    Not coughing up as much sputum as he used to but still coughing a lot    Dyspnea:  less energy to do treadmill  Cough: sporadic daytime/ in am mucus is yellow on mucinex dm qod  Sleeping: better on Right side  SABA use: none / doesn't think theyhelp  02: none  Rec Valsartan  80 mg twice daily in place of entresto  Change in mucus >trending toward  nastier, darker, bloodier > zpak          02/21/2022  f/u ov/[name] [name] office/[name] [name] re: GOLD 2/ bronchiectasis maint on no inhalers (did not help breathing)  Chief Complaint  Patient presents with   Follow-up    Pt states that his breathing is the same as LOV. Wants to discuss recent changes to medications  Zpak 2 weeks prior due to blood tinge sputum > cleared with min discoloration sice  Mucinex DM not using much at all  Dyspnea:  treadmill 45 min 3 x weekly  4% incline/ 4 mph Cough: worse first thing in am/ nothing purulent or excessive at this point Sleeping: slt elevated /10 degrees  SABA use: none  02: none  Covid status: vax max  Rec Reduce your valsartan to 80 mg daily  For cough/ congestion > mucinex dm 1200 mg twice daily as needed  Please remember to go to the lab department   for your tests - we will call you with the results when they are available.    03/25/2022  f/u ov/[name] [name] office/[name] [name] re: cough x 2016    Chief Complaint  Patient presents with   Follow-up    Has pink ish/ darker colored sputum  Breathing same since last ov    Dyspnea:  no change in ex tol on treadmill  Cough: worse first thing in am more pink > better p zpak  Sleeping: sleep at 10 degrees on bed blocks  SABA use: none  02: none  Rec For cough/ congestion > mucinex dm 1200 mg twice daily as needed and use flutter valve as much as you can  Make appt to see [name] [name] to get his opinion re the source of the cough  Change zithromax to 250 mg to one daily for the next 30 days and refill if you feel effective  especially for the morning cough  Depomedorl 120 mg IM  Symbicort 80 Take 2 puffs first thing in  am and then another 2 puffs about 12 hours later.        05/05/2022  f/u ov/[place] office/Clella Mckeel re: cough x 2016 maint on symb 80 2bid and completed zpak  Chief Complaint  Patient presents with   Follow-up    Doing better since lov has questions about medications and imaging    Dyspnea:  still doing treadmill Cough: mostly in am's / traces of brb ? Slt better on zmax  Sleeping: fine 3 in block  SABA use: none  02: none  Covid status: vax all/ infected once late Dec 2023    No obvious day to day or daytime variability or assoc   mucus plugs  or cp or chest tightness, subjective wheeze or overt sinus or hb symptoms.   Sleeping  without nocturnal  or early am exacerbation  of respiratory  c/o's or need for noct saba. Also denies any obvious fluctuation of symptoms with weather or environmental changes or other aggravating or alleviating factors except as outlined above   No unusual exposure hx or h/o childhood pna/ asthma or knowledge of premature birth.  Current Allergies, Complete Past Medical History, Past Surgical History, Family History, and Social History were reviewed in [employer] record.  ROS  The following are not active complaints unless bolded Hoarseness, sore throat, dysphagia, dental problems, itching, sneezing,  nasal congestion or discharge of excess mucus or purulent secretions, ear ache,   fever, chills, sweats, unintended wt loss or wt gain, classically pleuritic or exertional cp,  orthopnea pnd or arm/hand swelling  or leg swelling, presyncope, palpitations, abdominal pain, anorexia, nausea, vomiting, diarrhea  or change in bowel habits or change in bladder habits, change in stools or change in urine, dysuria, hematuria,  rash, arthralgias, visual complaints, headache, numbness, weakness or ataxia or problems with walking or coordination,  change in  mood or  memory.        Current Meds  Medication Sig   acetaminophen (TYLENOL) 500 MG tablet Take 500 mg by mouth every 6 (six) hours as needed for moderate pain.   ALPRAZolam (XANAX) 0.5 MG tablet Take 0.5 mg by mouth daily as needed.   aspirin EC 81 MG tablet Take 1 tablet (81 mg total) by mouth daily.   budesonide-formoterol (SYMBICORT) 80-4.5 MCG/ACT inhaler Take 2 puffs first thing in am and then another 2 puffs about 12 hours later.   Calcium Citrate-Vitamin D (CALCIUM CITRATE + D3 PO) Take 1 tablet by mouth daily.   Carboxymethylcellul-Glycerin (LUBRICATING EYE DROPS OP) Place 1 drop into both eyes 2 (two) times daily. As needed   carvedilol (COREG) 6.25 MG tablet TAKE 1 TABLET(6.25 MG) BY MOUTH TWICE DAILY WITH A MEAL   cyclobenzaprine (FLEXERIL) 5 MG tablet TAKE 1 TABLET BY MOUTH EVERY NIGHT AT BEDTIME FOR LOWER BACK PAIN   Dextromethorphan-guaiFENesin (MUCUS RELIEF DM PO) Take by mouth.   FARXIGA 10 MG TABS tablet TAKE 1 TABLET(10 MG) BY MOUTH DAILY   Menthol, Topical Analgesic, (BIOFREEZE EX) Apply topically.   Multiple Vitamins-Minerals (SENIOR VITES PO) Take 1 tablet by mouth daily.   nitroGLYCERIN (NITROSTAT) 0.4 MG SL tablet Place 1 tablet (0.4 mg total) under the tongue every 5 (five) minutes as needed.   NON FORMULARY Fluc2% toe nail drops--apply topically once daily for toenail fungus   rosuvastatin (CRESTOR) 40 MG tablet TAKE 1 TABLET(40 MG) BY MOUTH DAILY   sertraline (ZOLOFT) 50 MG tablet Take 50 mg by mouth daily.   tamsulosin (FLOMAX) 0.4 MG  CAPS capsule Take 1 capsule (0.4 mg total) by mouth daily.   valsartan (DIOVAN) 80 MG tablet One daily                 Past Medical History:  Diagnosis Date   Acute systolic heart failure (HCC)    COPD (chronic obstructive pulmonary disease) (HCC)    Enlarged prostate with lower urinary tract symptoms (LUTS)    Hyperlipidemia    Hypertension    Low serum testosterone level    PAF (paroxysmal atrial fibrillation) ([place])     Pneumonia 05/2014   Pyothorax without fistula (HCC)    Sciatica    Shortness of breath dyspnea    Solitary pulmonary nodule    STEMI (ST elevation myocardial infarction) ([place])    09/27/17 PCI/DES x1 mLAD, PTCA of the diag branch. EF 30% Lifevest at discharge.    Testicular hypofunction    Urinary frequency       Objective:    wts  05/05/2022        193  03/25/2022          190 02/21/2022        191 01/10/2022      187 07/05/2021        184 12/29/2020      182  06/23/2020          173   05/15/2020        174 04/27/2020          176  01/21/2020        192   12/13/19 192 lb 3.2 oz (87.2 kg)  08/06/19 198 lb (89.8 kg)  06/24/19 198 lb (89.8 kg)    Vital signs reviewed  05/05/2022  - Note at rest 02 sats  98% on RA   General appearance:    pleasant amb wm    HEENT : Oropharynx  clear      NECK :  without  apparent JVD/ palpable Nodes/TM    LUNGS: no acc muscle use,  Min barrel  contour chest wall with bilateral  slightly decreased bs s audible wheeze and  without cough on insp or exp maneuvers and min  Hyperresonant  to  percussion bilaterally    CV:  RRR  no s3 or murmur or increase in P2, and no edema   ABD:  soft and nontender with pos end  insp Hoover's  in the supine position.  No bruits or organomegaly appreciated   MS:  Nl gait/ ext warm without deformities Or obvious joint restrictions  calf tenderness, cyanosis or clubbing     SKIN: warm and dry without lesions    NEURO:  alert, approp, nl sensorium with  no motor or cerebellar deficits apparent.                    Assessment

## 2022-05-05 NOTE — Patient Instructions (Addendum)
No change on symbicort 80   Ok to use Mucinex dm 1200 mg twice daily as needed  If bleeding worse restart Zmax   My office will be contacting you by phone for referral for HRCT chest   - if you don't hear back from my office within one week please call us back or notify us thru MyChart and we'll address it right away.   Please schedule a follow up visit in 3 months but call sooner if needed

## 2022-05-05 NOTE — Assessment & Plan Note (Addendum)
See HRCT  02/18/20 Diffuse bronchial wall thickening with mild to moderate centrilobular and paraseptal emphysema. 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 - Flutter valve training 01/10/2022   - 03/25/22  daily zmax trial x one month no effect on low grade hemoptysis so d/c'd  - HRCT chest 05/05/2022 >>>  ordered.  Cough seems better but still with low grade hemoptysis so rec hrct to eval bronchiectasis and alternative cause for heme with consideration for fob/BAL looking for opportunistic infections like pseudomonas/ MAI but for now no more Zmax          Each maintenance medication was reviewed in detail including emphasizing most importantly the difference between maintenance and prns and under what circumstances the prns are to be triggered using an action plan format where appropriate.  Total time for H and P, chart review, counseling, reviewing hfa  device(s) and generating customized AVS unique to this office visit / same day charting = 34 min

## 2022-05-19 ENCOUNTER — Encounter: Payer: Self-pay | Admitting: Radiology

## 2022-05-24 DIAGNOSIS — R053 Chronic cough: Secondary | ICD-10-CM | POA: Diagnosis not present

## 2022-05-26 DIAGNOSIS — H6123 Impacted cerumen, bilateral: Secondary | ICD-10-CM | POA: Diagnosis not present

## 2022-05-27 ENCOUNTER — Encounter: Payer: Self-pay | Admitting: Cardiology

## 2022-05-27 ENCOUNTER — Ambulatory Visit: Payer: Medicare Other | Attending: Cardiology | Admitting: Cardiology

## 2022-05-27 VITALS — BP 130/76 | HR 76 | Ht 72.0 in | Wt 195.2 lb

## 2022-05-27 DIAGNOSIS — E782 Mixed hyperlipidemia: Secondary | ICD-10-CM

## 2022-05-27 DIAGNOSIS — I2510 Atherosclerotic heart disease of native coronary artery without angina pectoris: Secondary | ICD-10-CM

## 2022-05-27 DIAGNOSIS — I5022 Chronic systolic (congestive) heart failure: Secondary | ICD-10-CM | POA: Diagnosis not present

## 2022-05-27 DIAGNOSIS — R002 Palpitations: Secondary | ICD-10-CM | POA: Diagnosis not present

## 2022-05-27 NOTE — Patient Instructions (Signed)
Medication Instructions:  Your physician recommends that you continue on your current medications as directed. Please refer to the Current Medication list given to you today.   Labwork: None  Testing/Procedures: None  Follow-Up: Follow up with Dr. Branch in 6 months.   Any Other Special Instructions Will Be Listed Below (If Applicable).     If you need a refill on your cardiac medications before your next appointment, please call your pharmacy.  

## 2022-05-27 NOTE — Progress Notes (Signed)
Clinical Summary [name] [name] is a 77 y.o.male seen today for follow up of the following medical problems.    1. CAD/ICM/Chronic systolic HF - admit [id number] with STEMI. Received DES to LAD, PTCA of diag - 09/2017 echo LVEF 30-35%. He was discharged with lifevest for primary prevention - repeat 10/2017 35-40%, lifevest discontinued - given afib was started on plavix, asa, and anticoag x 1 month, then plavix and anticoag.    - medical therapy initially limited due to soft bp's and prior renal dysfunction.      02/2019 echo: LVEF A999333, grade I diasotlic dysfunction  [id number] echo: LVEF 40-45%, grade I dd, normal RV, mild MR      - last visit we started aldactone 12.'5mg'$  daily. F/u K was 5.5, Cr 1.34 - repeat labs 8/23 Cr 1.4, K 5.3   - rise in Cr, pulm lowered valsartan dose  - some ongoing SOB/cough - no recent edema, home weights 190 lbs. Up 5 lbs over the last few months - compliant with meds. Off entresto due to cough. 02/2022 1.48 - off aldactone due to high K.  - treadmill 3 times a week at the [facility]     2. COPD/Chronic cough - followed by Dr Melvyn Novas  off entresto - breathing and coughing is up and down     3. Afib in setting of STEMI - new diagnosis during his admission with acute MI. Unclear if isolated in setting of MI  02/2018 event monitor without recurrent afib, eliquis was stopped   - phone call 10/20/21 with palpitations - 10/2021 monitor with PACs, PVCs, rare short runs SVT longest 9 beats - weaning caffeine. Symptoms have improved.    - no recent palpitations      4. Hyperlipidemia  Jan 2022 TC 99 TG 71 HDL 47 LDL 37 - he is on rosuvatatin   - 02/2021 TC 116 TG 59 HDL 43 LDL 59 - 10/2021 TC 99 TG 58 HDL 44 LDL 42   5. Bladder tumor -  surgery 06/16/20 . The resected tumor was a low-grade urothelial carcinoma the bladder.  No muscle invasion present.    6. Depression   Spends a lot of time at [facility] where he has a place.  Past Medical  History:  Diagnosis Date   Acute systolic heart failure (HCC)    Anxiety    COPD (chronic obstructive pulmonary disease) (HCC)    Enlarged prostate with lower urinary tract symptoms (LUTS)    GERD (gastroesophageal reflux disease)    Hyperlipidemia    Hypertension    Low serum testosterone level    PAF (paroxysmal atrial fibrillation) ([place])    Pneumonia 05/2014   Pyothorax without fistula (HCC)    Sciatica    Shortness of breath dyspnea    Solitary pulmonary nodule    STEMI (ST elevation myocardial infarction) ([place])    09/27/17 PCI/DES x1 mLAD, PTCA of the diag [name] [name]. EF 30% Lifevest at discharge.    Testicular hypofunction    Urinary frequency      No Known Allergies   Current Outpatient Medications  Medication Sig Dispense Refill   acetaminophen (TYLENOL) 500 MG tablet Take 500 mg by mouth every 6 (six) hours as needed for moderate pain.     ALPRAZolam (XANAX) 0.5 MG tablet Take 0.5 mg by mouth daily as needed.     aspirin EC 81 MG tablet Take 1 tablet (81 mg total) by mouth daily. 90 tablet 3   azithromycin (  ZITHROMAX) 250 MG tablet One daily T (Patient not taking: Reported on 05/05/2022) 30 tablet 2   budesonide-formoterol (SYMBICORT) 80-4.5 MCG/ACT inhaler Take 2 puffs first thing in am and then another 2 puffs about 12 hours later. 1 each 12   Calcium Citrate-Vitamin D (CALCIUM CITRATE + D3 PO) Take 1 tablet by mouth daily.     Carboxymethylcellul-Glycerin (LUBRICATING EYE DROPS OP) Place 1 drop into both eyes 2 (two) times daily. As needed     carvedilol (COREG) 6.25 MG tablet TAKE 1 TABLET(6.25 MG) BY MOUTH TWICE DAILY WITH A MEAL 180 tablet 3   cyclobenzaprine (FLEXERIL) 5 MG tablet TAKE 1 TABLET BY MOUTH EVERY NIGHT AT BEDTIME FOR LOWER BACK PAIN 30 tablet 1   Dextromethorphan-guaiFENesin (MUCUS RELIEF DM PO) Take by mouth.     FARXIGA 10 MG TABS tablet TAKE 1 TABLET(10 MG) BY MOUTH DAILY 90 tablet 3   Menthol, Topical Analgesic, (BIOFREEZE EX) Apply topically.      Multiple Vitamins-Minerals (SENIOR VITES PO) Take 1 tablet by mouth daily.     nitroGLYCERIN (NITROSTAT) 0.4 MG SL tablet Place 1 tablet (0.4 mg total) under the tongue every 5 (five) minutes as needed. 25 tablet 3   NON FORMULARY Fluc2% toe nail drops--apply topically once daily for toenail fungus     rosuvastatin (CRESTOR) 40 MG tablet TAKE 1 TABLET(40 MG) BY MOUTH DAILY 90 tablet 3   sertraline (ZOLOFT) 50 MG tablet Take 50 mg by mouth daily.     tamsulosin (FLOMAX) 0.4 MG CAPS capsule Take 1 capsule (0.4 mg total) by mouth daily. 30 capsule 2   valsartan (DIOVAN) 80 MG tablet One daily     No current facility-administered medications for this visit.     Past Surgical History:  Procedure Laterality Date   APPENDECTOMY     BACK SURGERY     COLONOSCOPY  2011   Tubular adenoma per patient   COLONOSCOPY N/A 03/03/2015   Dr. Oneida Alar: Left colon is redundant, mild diverticulosis, small internal hemorrhoids, moderate external hemorrhoids.  Next colonoscopy in 5 years with overtube.   COLONOSCOPY N/A 03/25/2019   Procedure: COLONOSCOPY;  Surgeon: Danie Binder, MD;  [facility];  Service: Endoscopy;  Laterality: N/A;  8:30am - Camille's request   CORONARY/GRAFT ACUTE MI REVASCULARIZATION N/A 09/27/2017   Procedure: Coronary/Graft Acute MI Revascularization;  Surgeon: Burnell Blanks, MD;  Location: [place] [facility];  Service: Cardiovascular;  Laterality: N/A;   CYSTOSCOPY W/ RETROGRADES Bilateral 06/16/2020   Procedure: CYSTOSCOPY WITH RETROGRADE PYELOGRAM;  Surgeon: [name], MD;  Location: [facility];  Service: Urology;  Laterality: Bilateral;   EMPYEMA DRAINAGE Right 06/27/2014   Procedure: EMPYEMA DRAINAGE;  Surgeon: Grace Isaac, MD;  Location: [place];  Service: Thoracic;  Laterality: Right;   HEMORRHOID BANDING N/A 03/03/2015   Procedure: Thayer Jew;  Surgeon: Danie Binder, MD;  [facility];  Service: Endoscopy;  Laterality: N/A;   Recomended to see a Psychologist, sport and exercise.   HERNIA REPAIR     INTRAVASCULAR ULTRASOUND/IVUS N/A 09/27/2017   Procedure: Intravascular Ultrasound/IVUS;  Surgeon: Burnell Blanks, MD;  Location: [place] [facility];  Service: Cardiovascular;  Laterality: N/A;   LEFT HEART CATH AND CORONARY ANGIOGRAPHY N/A 09/27/2017   Procedure: LEFT HEART CATH AND CORONARY ANGIOGRAPHY;  Surgeon: Burnell Blanks, MD;  Location: [place] [facility];  Service: Cardiovascular;  Laterality: N/A;   LUNG SURGERY  2016   POLYPECTOMY  03/25/2019   Procedure: POLYPECTOMY;  Surgeon: Danie Binder, MD;  [facility];  Service: Endoscopy;;   TEE WITHOUT CARDIOVERSION N/A 07/14/2014   Procedure: TRANSESOPHAGEAL ECHOCARDIOGRAM (TEE);  Surgeon: Sueanne Margarita, MD;  Location: [place];  Service: Cardiovascular;  Laterality: N/A;   TRANSURETHRAL RESECTION OF BLADDER TUMOR N/A 06/16/2020   Procedure: TRANSURETHRAL RESECTION OF BLADDER TUMOR (TURBT) WITH GEMCITABINE;  Surgeon: [name], MD;  Location: [facility];  Service: Urology;  Laterality: N/A;   VARICOSE VEIN SURGERY     VIDEO ASSISTED THORACOSCOPY Right 06/27/2014   Procedure: VIDEO ASSISTED THORACOSCOPY;  Surgeon: Grace Isaac, MD;  Location: [place];  Service: Thoracic;  Laterality: Right;   VIDEO BRONCHOSCOPY N/A 06/27/2014   Procedure: VIDEO BRONCHOSCOPY;  Surgeon: Grace Isaac, MD;  Location: [facility];  Service: Thoracic;  Laterality: N/A;     No Known Allergies    Family History  Problem Relation Age of Onset   Tuberculosis Father    Hypertension Father    COPD Mother    Hypertension Mother    Colon cancer Neg Hx      Social History Mr. Mashek reports that he quit smoking about 7 years ago. His smoking use included pipe and cigarettes. He has a 10.00 pack-year smoking history. He has never used smokeless tobacco. Mr. Rowden reports that he does not currently use alcohol.   Review of Systems CONSTITUTIONAL: No weight loss, fever,  chills, weakness or fatigue.  HEENT: Eyes: No visual loss, blurred vision, double vision or yellow sclerae.No hearing loss, sneezing, congestion, runny nose or sore throat.  SKIN: No rash or itching.  CARDIOVASCULAR: per hpi RESPIRATORY: per hpi GASTROINTESTINAL: No anorexia, nausea, vomiting or diarrhea. No abdominal pain or blood.  GENITOURINARY: No burning on urination, no polyuria NEUROLOGICAL: No headache, dizziness, syncope, paralysis, ataxia, numbness or tingling in the extremities. No change in bowel or bladder control.  MUSCULOSKELETAL: No muscle, back pain, joint pain or stiffness.  LYMPHATICS: No enlarged nodes. No history of splenectomy.  PSYCHIATRIC: No history of depression or anxiety.  ENDOCRINOLOGIC: No reports of sweating, cold or heat intolerance. No polyuria or polydipsia.  Marland Kitchen   Physical Examination Today's Vitals   05/27/22 1117  BP: 130/76  Pulse: 76  SpO2: 97%  Weight: 195 lb 3.2 oz (88.5 kg)  Height: 6' (1.829 m)   Body mass index is 26.47 kg/m.  Gen: resting comfortably, no acute distress HEENT: no scleral icterus, pupils equal round and reactive, no palptable cervical adenopathy,  CV: RRR, no m/rg, no jvd Resp: Clear to auscultation bilaterally GI: abdomen is soft, non-tender, non-distended, normal bowel sounds, no hepatosplenomegaly MSK: extremities are warm, no edema.  Skin: warm, no rash Neuro:  no focal deficits Psych: appropriate affect   Diagnostic Studies  09/2017 cath Prox RCA lesion is 30% stenosed. Prox LAD lesion is 100% stenosed. Prox Cx lesion is 20% stenosed. A drug-eluting stent was successfully placed using a STENT SYNERGY DES 3X16. Post intervention, there is a 0% residual stenosis. Ost 1st Diag lesion is 100% stenosed. Balloon angioplasty was performed using a BALLOON SAPPHIRE 2.5X12. Post intervention, there is a 40% residual stenosis. Prox LAD to Mid LAD lesion is 40% stenosed.   1. Acute anterior STEMI secondary to  occlusion of the mid LAD 2. Successful PTCA/DES x 1 mid LAD 3. Successful PTCA/angioplasty ostium of the Diagonal [name] [name].  4. Mild non-obstructive disease in the RCA, circumflex and mid LAD   Post cath Recommendations:  Will admit to the ICU and continue  Aggrastat for 6 hours. Will start high intensity statin and a beta blocker. Echo later today.   . Recommend uninterrupted dual antiplatelet therapy with Aspirin '81mg'$  daily and Ticagrelor '90mg'$  twice daily for a minimum of 12 months (ACS - Class I recommendation).      09/2017 echo Study Conclusions   - Left ventricle: The cavity size was normal. There was mild focal   basal hypertrophy of the septum. Systolic function was moderately   to severely reduced. The estimated ejection fraction was in the   range of 30% to 35%. Severe hypokinesis of the mid to apical   anteroseptal, anterior, and anterolateral myocardium. Dyskinetic   at apex. Doppler parameters are consistent with abnormal left   ventricular relaxation (grade 1 diastolic dysfunction). - Aortic valve: Trileaflet; mildly thickened, mildly calcified   leaflets. - Mitral valve: Calcified annulus. There was trivial regurgitation. - Pulmonary arteries: PA peak pressure: 41 mm Hg (S). - Pericardium, extracardiac: A small pericardial effusion was   identified.   Impressions:   - New reduction in LV EF with regional wall motion abnormalities in   the mid to apical anteroseptum, anterior, and anterolateral walls   with dyskinesis at the apex. Small pericardial effusion noted.   10/2017 echo Study Conclusions   - Left ventricle: The cavity size was normal. Wall thickness was   increased in a pattern of moderate LVH. Systolic function was   moderately reduced. The estimated ejection fraction was in the   range of 35% to 40%. Doppler parameters are consistent with   abnormal left ventricular relaxation (grade 1 diastolic   dysfunction). Doppler parameters are consistent with high    ventricular filling pressure. - Regional wall motion abnormality: Hypokinesis of the apical   septal, apical lateral, and apical myocardium. - Aortic valve: Mildly calcified annulus. Trileaflet; mildly   thickened leaflets. Valve area (VTI): 4.91 cm^2. Valve area   (Vmax): 4.27 cm^2. Valve area (Vmean): 3.54 cm^2. - Mitral valve: Mildly calcified annulus. Mildly thickened leaflets   . - Left atrium: The atrium was moderately dilated. - Pericardium, extracardiac: There is a small circumferential   pericardial effusion. The effusion measures 0.8 cm adjacent to   the LV. There is no evidence of tamponade physiology. - Technically adequate study.     02/2018 heart monitor 21 day event monitor Min HR 48, Max HR 130, Avg HR 71. Min HR occurred in early AM hours presumably while sleeping No symptoms reported 4 beat run of NSVT that was asymptomatic     02/2019 echo   IMPRESSIONS      1. Left ventricular ejection fraction, by visual estimation, is 40 to 45%. The left ventricle has mildly decreased function. There is moderately increased left ventricular hypertrophy.  2. Elevated left atrial pressure.  3. Left ventricular diastolic parameters are consistent with Grade I diastolic dysfunction (impaired relaxation).  4. Hypokinesis of the apex, mid to distal anteroseptal, apical lateral, and apical myocardium.  5. Global right ventricle has normal systolic function.The right ventricular size is normal. No increase in right ventricular wall thickness.  6. Left atrial size was moderately dilated.  7. Right atrial size was normal.  8. The mitral valve is normal in structure. Mild mitral valve regurgitation. No evidence of mitral stenosis.  9. The tricuspid valve is normal in structure. Tricuspid valve regurgitation is not demonstrated. 10. The aortic valve is tricuspid. Aortic valve regurgitation is not visualized. No evidence of aortic valve sclerosis or stenosis. 11. The pulmonic valve was  not well visualized. Pulmonic valve regurgitation is not visualized. 12. The inferior vena cava is normal in size with greater than 50% respiratory variability, suggesting right atrial pressure of 3 mmHg.   10/2020 echo 1. Left ventricular ejection fraction, by estimation, is 40 to 45%. The  left ventricle has mildly decreased function. The left ventricle  demonstrates regional wall motion abnormalities (see scoring  diagram/findings for description). There is mild left  ventricular hypertrophy. Left ventricular diastolic parameters are  consistent with Grade I diastolic dysfunction (impaired relaxation).   2. Right ventricular systolic function is normal. The right ventricular  size is normal. Tricuspid regurgitation signal is inadequate for assessing  PA pressure.   3. Left atrial size was mildly dilated.   4. The mitral valve is degenerative. Mild mitral valve regurgitation. The  mean mitral valve gradient is 1.0 mmHg.   5. The aortic valve is tricuspid. There is mild calcification of the  aortic valve. Aortic valve regurgitation is not visualized. Mild aortic  valve sclerosis is present, with no evidence of aortic valve stenosis.  Aortic valve mean gradient measures 3.0  mmHg.   6. The inferior vena cava is normal in size with greater than 50%  respiratory variability, suggesting right atrial pressure of 3 mmHg.            Assessment and Plan  1. CAD/ICM/chronic systolic HF - medical therapy previously limited by prior renal dysfunction, soft bp's however both issues have resolved.  - cough now off entresto, valsartan dose lowered due to uptrending Cr. Off aldactone due to elevated K.  - no symptoms, continue current meds - EKG today shows SR, chronic ST/T changes     2. Hyperlipidemia - at goal he will continue current meds   3. Palpitations - monitor with benign ectopy, short infrequent SVT - no symptoms, continue current meds    F/u 6 months    Arnoldo Lenis, M.D.

## 2022-05-30 DIAGNOSIS — Z23 Encounter for immunization: Secondary | ICD-10-CM | POA: Diagnosis not present

## 2022-06-15 DIAGNOSIS — L258 Unspecified contact dermatitis due to other agents: Secondary | ICD-10-CM | POA: Diagnosis not present

## 2022-06-16 ENCOUNTER — Ambulatory Visit (HOSPITAL_COMMUNITY)
Admission: RE | Admit: 2022-06-16 | Discharge: 2022-06-16 | Disposition: A | Discharge status: Home / Self Care | Payer: Medicare Other | Source: Ambulatory Visit | Attending: Internal Medicine | Admitting: Internal Medicine

## 2022-06-16 DIAGNOSIS — R911 Solitary pulmonary nodule: Secondary | ICD-10-CM | POA: Diagnosis not present

## 2022-06-16 DIAGNOSIS — J9809 Other diseases of bronchus, not elsewhere classified: Secondary | ICD-10-CM | POA: Diagnosis not present

## 2022-06-16 DIAGNOSIS — J479 Bronchiectasis, uncomplicated: Secondary | ICD-10-CM | POA: Insufficient documentation

## 2022-06-16 DIAGNOSIS — J432 Centrilobular emphysema: Secondary | ICD-10-CM | POA: Diagnosis not present

## 2022-07-08 ENCOUNTER — Telehealth: Payer: Self-pay | Admitting: Orthopaedic Surgery

## 2022-07-14 DIAGNOSIS — B351 Tinea unguium: Secondary | ICD-10-CM | POA: Diagnosis not present

## 2022-08-04 DIAGNOSIS — Z125 Encounter for screening for malignant neoplasm of prostate: Secondary | ICD-10-CM | POA: Diagnosis not present

## 2022-08-04 DIAGNOSIS — E875 Hyperkalemia: Secondary | ICD-10-CM | POA: Diagnosis not present

## 2022-08-04 DIAGNOSIS — I2510 Atherosclerotic heart disease of native coronary artery without angina pectoris: Secondary | ICD-10-CM | POA: Diagnosis not present

## 2022-08-04 DIAGNOSIS — R7303 Prediabetes: Secondary | ICD-10-CM | POA: Diagnosis not present

## 2022-08-04 DIAGNOSIS — N1831 Chronic kidney disease, stage 3a: Secondary | ICD-10-CM | POA: Diagnosis not present

## 2022-08-05 ENCOUNTER — Encounter: Payer: Self-pay | Admitting: Urology

## 2022-08-05 ENCOUNTER — Ambulatory Visit (INDEPENDENT_AMBULATORY_CARE_PROVIDER_SITE_OTHER): Payer: Medicare Other | Admitting: Urology

## 2022-08-05 VITALS — BP 158/75 | HR 69 | Ht 72.0 in | Wt 184.0 lb

## 2022-08-05 DIAGNOSIS — Z8551 Personal history of malignant neoplasm of bladder: Secondary | ICD-10-CM | POA: Diagnosis not present

## 2022-08-05 DIAGNOSIS — C679 Malignant neoplasm of bladder, unspecified: Secondary | ICD-10-CM | POA: Diagnosis not present

## 2022-08-05 LAB — URINALYSIS, COMPLETE
Bilirubin, UA: NEGATIVE
Ketones, UA: NEGATIVE
Leukocytes,UA: NEGATIVE
Nitrite, UA: NEGATIVE
RBC, UA: NEGATIVE
Specific Gravity, UA: 1.01 (ref 1.005–1.030)
Urobilinogen, Ur: 0.2 mg/dL (ref 0.2–1.0)
pH, UA: 6 (ref 5.0–7.5)

## 2022-08-05 LAB — MICROSCOPIC EXAMINATION
Bacteria, UA: NONE SEEN
Epithelial Cells (non renal): NONE SEEN /hpf (ref 0–10)

## 2022-08-05 LAB — LAB REPORT - SCANNED
A1c: 5.8
Albumin, Urine POC: 74.2
Creatinine, POC: 70.7 mg/dL
EGFR: 56
Microalb Creat Ratio: 105

## 2022-08-05 MED ORDER — CIPROFLOXACIN HCL 500 MG PO TABS
500.0000 mg | ORAL_TABLET | Freq: Once | ORAL | Status: AC
  Administered 2022-08-05: 500 mg via ORAL

## 2022-08-05 NOTE — Progress Notes (Signed)
   08/05/22  CC:  Chief Complaint  Patient presents with   Cysto    1 year follow-up cysto    Urologic history: 1.  Urothelial carcinoma bladder TURBT 06/16/2020 < 2 cm papillary bladder tumor Post resection intravesical gemcitabine Pathology low-grade, noninvasive urothelial carcinoma bladder   HPI: No complaints since TURBT.  Has noted some increased post void dribbling since last visit  Refer to rooming tab for vitals NED. A&Ox3.   No respiratory distress   Abd soft, NT, ND Normal phallus with bilateral descended testicles  Cystoscopy Procedure Note  Patient identification was confirmed, informed consent was obtained, and patient was prepped using Betadine solution.  Lidocaine jelly was administered per urethral meatus.     Pre-Procedure: - Inspection reveals a normal caliber urethral meatus.  Procedure: The flexible cystoscope was introduced without difficulty - No urethral strictures/lesions are present. - Prominent lateral lobe enlargement prostate  - Moderate elevation bladder neck - Bilateral ureteral orifices identified - Bladder mucosa  reveals no ulcers, tumors, or lesions - No bladder stones - moderate trabeculation, scattered diverticula  Retroflexion shows no abnormalities   Post-Procedure: - Patient tolerated the procedure well  Assessment/ Plan: No evidence recurrent urothelial carcinoma Schedule surveillance cystoscopy 1 year History post cystoscopy UTI and he was given Septra DS 1 tab pre-procedure Discussed titrating tamsulosin to 0.8 mg daily for BPH symptoms.  At this point he states symptoms are not bothersome enough that he desires to treat.  He will call back if symptoms worsen   Riki Altes, MD

## 2022-08-06 NOTE — Progress Notes (Unsigned)
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 Judee Clara for eval of COPD. Former Dr [name] pt. He is maintained on spiriva and does not use a rescue inhaler. He states his breathing is doing well currently.    Dyspnea:  3 x weekly treadmill x 50 min at 3.6 -4 and 4.5  Cough: comes and goes x years variably productive not noct  Sleep: does fine in recliner / can do flat in bed  SABA use: none  rec Try off spiriva to see if you notice any difference in your exercise tolerance or cough For cough > mucinex dm up to 1200 mg every 12 hours as needed  Please schedule a follow up visit in 2 months at [place] office with PFT's first if possible      12/13/2019  f/u ov/Launa Goedken re:  GOLD II barely / maint on bevespi and off lisinopril  08/13/19 Chief Complaint  Patient presents with   Follow-up    productive cough with thick, yellow phlegm  Dyspnea:  Treadmill x up to 55 min 4pm x 4%  Cough: ever since empyema surgery but changed early September 2021 better p zpak Sleeping: in recliner due to back / minimal cough  SABA use: none  02: none  rec Breztri Take 2 puffs first thing in am and then another 2 puffs about 12 hours later x 2 weeks and stop and then restart bevespi only if notice deterioration Pantoprazole (protonix) 40 mg   Take  30-60 min before first meal of the day and Pepcid (famotidine)  20 mg one after  Supper until return to office  GERD diet  Please remember to go to the lab and x-ray department at [facility]   for your tests - we will call you with the results  when they are available. Please schedule a follow up office visit in 6 weeks, call sooner if needed      01/21/2020  f/u ov/Raegyn Renda re: COPD GOLD II /  cough x 2016 when dx with R empyema  Chief Complaint  Patient presents with   Follow-up    productive cough with pinkish color phlegm  Dyspnea:  No change ex tol treadmill on or off breztri vs bevespi vs nothin  Cough: actually better while on treadmill/ still pink tinged variably on baby asa daily  Sleeping: worse on L side down  SABA use: none  02: none  rec For changed mucus from your normal go ahead and use zpak  If mucus gets really bloody, stop the baby aspirin until better  Ok to stop the reflux meds  - you may need to take pepcid 20 mg twice daily x one week then one daily x week and stop   schedule your CT chest and sinus and the results > bronchiectasis/ no sinusitis   07/05/2021  f/u ov/[place] office/Janeisha Ryle re: GOLD 2/ bronchiectasis maint on zpak prn   Chief Complaint  Patient presents with   Follow-up    SOB and cough are about  the same since last ov.   wants refill for z pak  Dyspnea:  treadmill same tol/sats  Cough: sporadic but this winter was the worse p entresto started / been with mucinex dm but not on max doses / lots of am am mucus typically  Sleeping: bed blocks fine SABA use: none  02: none  Covid status: vax x 5  Lung cancer screening: not sure he's eligible Rec I will be referring you for lung cancer screening  - it actually isn't due until 03/11/22  The sacubitrilat component of entresto is not and ACEi but it does lead to higher levels of bradykinin Refill zpak  - should turn mucus clear or less dark Max dose guaifensin is 1200 mg every 12 hours and use the flutter valve if needed  Please schedule a follow up visit in 6  months but call sooner if needed    05/05/2022  f/u ov/[place] office/Marylon Verno re: cough x 2016 maint on symb 80 2bid and completed zpak  Chief Complaint  Patient presents with    Follow-up    Doing better since lov has questions about medications and imaging    Dyspnea:  still doing treadmill Cough: mostly in am's / traces of brb ? Slt better on zmax  Sleeping: fine 3 in block  SABA use: none  02: none  Covid status: vax all/ infected once late Dec 2023  Rec No change on symbicort 20  Ok to use Mucinex dm 1200 mg twice daily as needed If bleeding worse restart Zmax  My office will be contacting you by phone for referral for HRCT chest      08/08/2022  3 m f/u ov/[place] office/Adam Sanjuan re: GOLD 2/bronchiectasis/ cough x 2016  maint on symbicort 80 2bid   Chief Complaint  Patient presents with   Follow-up    Breathing is about the same. He has occ cough- currently non prod. He does not have a rescue inhaler.    Dyspnea:  thinks is hurting his ex routine  Cough: dry now/ recent rx zmax for fever resolved  Sleeping: 3 in blocks SABA use: don't use it      No obvious day to day or daytime variability or assoc excess/ purulent sputum or mucus plugs or hemoptysis or cp or chest tightness, subjective wheeze or overt sinus or hb symptoms.   *** without nocturnal  or early am exacerbation  of respiratory  c/o's or need for noct saba. Also denies any obvious fluctuation of symptoms with weather or environmental changes or other aggravating or alleviating factors except as outlined above   No unusual exposure hx or h/o childhood pna/ asthma or knowledge of premature birth.  Current Allergies, Complete Past Medical History, Past Surgical History, Family History, and Social History were reviewed in [employer] record.  ROS  The following are not active complaints unless bolded Hoarseness, sore throat, dysphagia, dental problems, itching, sneezing,  nasal congestion or discharge of excess mucus or purulent secretions, ear ache,   fever, chills, sweats, unintended wt loss or wt gain, classically pleuritic or exertional cp,  orthopnea pnd or arm/hand  swelling  or leg swelling, presyncope, palpitations, abdominal pain, anorexia, nausea, vomiting, diarrhea  or change in bowel habits or change in bladder habits, change in stools or change in urine, dysuria, hematuria,  rash, arthralgias, visual complaints, headache, numbness, weakness or ataxia or problems with walking or coordination,  change in mood or  memory.  Current Meds  Medication Sig   acetaminophen (TYLENOL) 500 MG tablet Take 500 mg by mouth every 6 (six) hours as needed for moderate pain.   ALPRAZolam (XANAX) 0.25 MG tablet Take 0.25 mg by mouth at bedtime as needed for anxiety.   aspirin EC 81 MG tablet Take 1 tablet (81 mg total) by mouth daily.   budesonide-formoterol (SYMBICORT) 80-4.5 MCG/ACT inhaler Take 2 puffs first thing in am and then another 2 puffs about 12 hours later.   Calcium Citrate-Vitamin D (CALCIUM CITRATE + D3 PO) Take 1 tablet by mouth daily.   Carboxymethylcellul-Glycerin (LUBRICATING EYE DROPS OP) Place 1 drop into both eyes 2 (two) times daily. As needed   carvedilol (COREG) 6.25 MG tablet TAKE 1 TABLET(6.25 MG) BY MOUTH TWICE DAILY WITH A MEAL   cyclobenzaprine (FLEXERIL) 5 MG tablet TAKE 1 TABLET BY MOUTH EVERY NIGHT AT BEDTIME FOR LOWER BACK PAIN   Dextromethorphan-guaiFENesin (MUCUS RELIEF DM PO) Take by mouth.   FARXIGA 10 MG TABS tablet TAKE 1 TABLET(10 MG) BY MOUTH DAILY   Menthol, Topical Analgesic, (BIOFREEZE EX) Apply topically.   Multiple Vitamins-Minerals (SENIOR VITES PO) Take 1 tablet by mouth daily.   nitroGLYCERIN (NITROSTAT) 0.4 MG SL tablet Place 1 tablet (0.4 mg total) under the tongue every 5 (five) minutes as needed.   rosuvastatin (CRESTOR) 40 MG tablet TAKE 1 TABLET(40 MG) BY MOUTH DAILY   sertraline (ZOLOFT) 50 MG tablet Take 50 mg by mouth daily.   tamsulosin (FLOMAX) 0.4 MG CAPS capsule Take 1 capsule (0.4 mg total) by mouth daily.   valsartan (DIOVAN) 80 MG tablet Take 80 mg by mouth daily.                Past Medical  History:  Diagnosis Date   Acute systolic heart failure (HCC)    COPD (chronic obstructive pulmonary disease) (HCC)    Enlarged prostate with lower urinary tract symptoms (LUTS)    Hyperlipidemia    Hypertension    Low serum testosterone level    PAF (paroxysmal atrial fibrillation) (HCC)    Pneumonia 05/2014   Pyothorax without fistula (HCC)    Sciatica    Shortness of breath dyspnea    Solitary pulmonary nodule    STEMI (ST elevation myocardial infarction) (HCC)    09/27/17 PCI/DES x1 mLAD, PTCA of the diag branch. EF 30% Lifevest at discharge.    Testicular hypofunction    Urinary frequency       Objective:    wts  08/08/2022         ***  05/05/2022        193  03/25/2022          190 02/21/2022        191 01/10/2022      187 07/05/2021        184 12/29/2020      182  06/23/2020          173   05/15/2020        174 04/27/2020          176  01/21/2020        192   12/13/19 192 lb 3.2 oz (87.2 kg)  08/06/19 198 lb (89.8 kg)  06/24/19 198 lb (89.8 kg)    Vital signs reviewed  08/08/2022  - Note at rest 02 sats  ***% on ***   General appearance:    ***    Min barr*** min rhonchi no wheeze  Lab Results  Component Value Date   CREATININE 1.57 (H) 02/16/2022   CREATININE 1.39 (H) 01/21/2022   CREATININE 1.41 (H) 01/10/2022     I personally reviewed images and agree with radiology impression as follows:   Chest HRCT 06/16/22 1. Bronchial wall thickening, mucous plugging and bronchiectasis of the right lower lobe. Numerous tiny centrilobular nodules of the right lower and middle lobes. Improved aeration of the right lower lobe airways when compared with prior exam. Findings are consistent with chronic atypical infection. 2. New irregular solid pulmonary nodule of the right lower lobe measuring 5 mm. Recommend follow-up chest CT in 6 months to ensure resolution. 3. Severe dilation of the main pulmonary artery, measuring up to 5.4 cm, unchanged when compared  with the prior exam, findings can be seen in the setting of pulmonary hypertension. 4. Severe coronary artery calcifications. 5. Aortic Atherosclerosis (ICD10-I70.0) and Emphysema (ICD10-J43.9).      Assessment

## 2022-08-08 ENCOUNTER — Ambulatory Visit (INDEPENDENT_AMBULATORY_CARE_PROVIDER_SITE_OTHER): Payer: Medicare Other | Admitting: Internal Medicine

## 2022-08-08 ENCOUNTER — Encounter: Payer: Self-pay | Admitting: Internal Medicine

## 2022-08-08 VITALS — BP 138/66 | HR 75 | Ht 72.0 in | Wt 188.0 lb

## 2022-08-08 DIAGNOSIS — J479 Bronchiectasis, uncomplicated: Secondary | ICD-10-CM

## 2022-08-08 DIAGNOSIS — J449 Chronic obstructive pulmonary disease, unspecified: Secondary | ICD-10-CM | POA: Diagnosis not present

## 2022-08-08 NOTE — Patient Instructions (Addendum)
I would prefer Dr Scharlene Gloss office fill your prescription for the valsartan since they are monitoring your kidney closer than Dr Wyline Mood and I arey  We will order another CT chest for September 2024 in [facility] there if I'm there and if not we can get you into the pulmonary clinic there to see one of our other doctors - if any problems getting this scheduled please contact us on MyChart   I recommend 6 inch bed blocks for bronchiectasis

## 2022-08-09 ENCOUNTER — Encounter: Payer: Self-pay | Admitting: Orthopaedic Surgery

## 2022-08-09 ENCOUNTER — Other Ambulatory Visit: Payer: Self-pay | Admitting: Internal Medicine

## 2022-08-09 ENCOUNTER — Ambulatory Visit (INDEPENDENT_AMBULATORY_CARE_PROVIDER_SITE_OTHER): Payer: Medicare Other | Admitting: Orthopaedic Surgery

## 2022-08-09 ENCOUNTER — Other Ambulatory Visit (INDEPENDENT_AMBULATORY_CARE_PROVIDER_SITE_OTHER): Payer: Medicare Other

## 2022-08-09 VITALS — BP 126/68 | HR 80 | Ht 72.0 in | Wt 190.0 lb

## 2022-08-09 DIAGNOSIS — M25562 Pain in left knee: Secondary | ICD-10-CM

## 2022-08-09 DIAGNOSIS — G8929 Other chronic pain: Secondary | ICD-10-CM

## 2022-08-09 DIAGNOSIS — M545 Low back pain, unspecified: Secondary | ICD-10-CM

## 2022-08-09 DIAGNOSIS — R911 Solitary pulmonary nodule: Secondary | ICD-10-CM

## 2022-08-09 NOTE — Assessment & Plan Note (Signed)
MM/Quit all smoking 2016 with dx of R empyema with chronic cough since then - Try off spiriva 05/21/2019  - PFT's  08/06/19  FEV1 2.60 (75 % ) ratio 0.64  p 14 % improvement from saba p 0 prior to study with DLCO  18.09 (66%) corrects to 3.14 (79%)  for alv volume and FV curve minimally concave  -  04/27/2020   Walked RA  approx   600 ft  @ fast pace  stopped due to  Sob with sats 99%    -  04/27/2020     alpha one AT phenotype  MM level 141  - 05/15/2020  After extensive coaching inhaler device,  effectiveness =    75% from a baseline of well < 50%  So ok to rechallenge with prn saba  Due to bronchiectasis using low dose ICS /laba = symbicort 80 2bid with well preserved ex tolerance at baseline/ no changes needed         Each maintenance medication was reviewed in detail including emphasizing most importantly the difference between maintenance and prns and under what circumstances the prns are to be triggered using an action plan format where appropriate.  Total time for H and P, chart review, counseling, reviewing hfa device(s) and generating customized AVS unique to this office visit / same day charting = 

## 2022-08-09 NOTE — Assessment & Plan Note (Signed)
See HRCT  02/18/20 Diffuse bronchial wall thickening with mild to moderate centrilobular and paraseptal emphysema. 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 - Flutter valve training 01/10/2022   - 03/25/22  daily zmax trial x one month no effect on low grade hemoptysis so d/c'd  - HRCT chest  06/16/22 c/w bronchiectasis/ MAI   CT suggests slt progression but pt doing well on just prn zpak, will f/u in [place] p moves this summer to [place], but not changes needed in meantime.

## 2022-08-09 NOTE — Progress Notes (Signed)
I hurt my knee.  He fell at home about six to eight weeks ago and hurt his left knee.  He hit his head also but had no problem with that.  He has residual pain in the left knee.  He has pain more in the afternoon and evening, not in the morning hours.  He has no swelling, no redness, no giving way.  He has tried Tylenol which helps.  He cannot take NSAIDs secondary to kidney problems.  He has no problem with the right knee.  He has no edema of the leg.  Left knee has full ROM, stable, slight crepitus, no effusion, normal gait, no distal edema, NV intact.  X-rays were done of the left knee including sunrise, reported separately.  Encounter Diagnoses  Name Primary?   Chronic pain of left knee Yes   Lumbar pain    I have recommended observation for now.  Use the Tylenol or Biofreeze or one of the other rubs as needed.  He will have some discomfort that should slowly improve.  I can get a MRI if it gets worse.  Return prn.  Call if any problem.  Precautions discussed.  Electronically Signed Darreld Mclean, MD 5/21/20243:38 PM

## 2022-08-11 DIAGNOSIS — I2510 Atherosclerotic heart disease of native coronary artery without angina pectoris: Secondary | ICD-10-CM | POA: Diagnosis not present

## 2022-08-11 DIAGNOSIS — R7303 Prediabetes: Secondary | ICD-10-CM | POA: Diagnosis not present

## 2022-08-11 DIAGNOSIS — K5909 Other constipation: Secondary | ICD-10-CM | POA: Diagnosis not present

## 2022-08-11 DIAGNOSIS — R053 Chronic cough: Secondary | ICD-10-CM | POA: Diagnosis not present

## 2022-08-11 DIAGNOSIS — G4700 Insomnia, unspecified: Secondary | ICD-10-CM | POA: Diagnosis not present

## 2022-08-11 DIAGNOSIS — I5022 Chronic systolic (congestive) heart failure: Secondary | ICD-10-CM | POA: Diagnosis not present

## 2022-08-11 DIAGNOSIS — F5105 Insomnia due to other mental disorder: Secondary | ICD-10-CM | POA: Diagnosis not present

## 2022-08-11 DIAGNOSIS — R0600 Dyspnea, unspecified: Secondary | ICD-10-CM | POA: Diagnosis not present

## 2022-08-11 DIAGNOSIS — F331 Major depressive disorder, recurrent, moderate: Secondary | ICD-10-CM | POA: Diagnosis not present

## 2022-08-11 DIAGNOSIS — R634 Abnormal weight loss: Secondary | ICD-10-CM | POA: Diagnosis not present

## 2022-08-11 DIAGNOSIS — Z23 Encounter for immunization: Secondary | ICD-10-CM | POA: Diagnosis not present

## 2022-08-11 DIAGNOSIS — J449 Chronic obstructive pulmonary disease, unspecified: Secondary | ICD-10-CM | POA: Diagnosis not present

## 2022-08-12 LAB — BASIC METABOLIC PANEL
BUN: 19 (ref 4–21)
Creatinine: 1.3 (ref 0.6–1.3)
Glucose: 103

## 2022-08-12 LAB — HEMOGLOBIN A1C: Hemoglobin A1C: 5.8

## 2022-08-12 LAB — CBC: RBC: 4.59 (ref 3.87–5.11)

## 2022-08-12 LAB — LIPID PANEL
Cholesterol: 95 (ref 0–200)
HDL: 47 (ref 35–70)
LDL Cholesterol: 15
Triglycerides: 69 (ref 40–160)

## 2022-08-12 LAB — CBC AND DIFFERENTIAL
HCT: 45 (ref 41–53)
Hemoglobin: 14.6 (ref 13.5–17.5)
WBC: 11.5

## 2022-08-12 LAB — COMPREHENSIVE METABOLIC PANEL: eGFR: 56

## 2022-08-12 LAB — MICROALBUMIN / CREATININE URINE RATIO: Microalb Creat Ratio: 105

## 2022-08-12 LAB — PROTEIN / CREATININE RATIO, URINE
Albumin, U: 74.2
Creatinine, Urine: 70.7

## 2022-08-15 ENCOUNTER — Encounter: Payer: Self-pay | Admitting: Internal Medicine

## 2022-08-15 ENCOUNTER — Encounter: Payer: Self-pay | Admitting: Cardiology

## 2022-09-08 ENCOUNTER — Encounter: Payer: Self-pay | Admitting: Internal Medicine

## 2022-09-14 ENCOUNTER — Encounter: Payer: Self-pay | Admitting: Internal Medicine

## 2022-09-15 NOTE — Telephone Encounter (Signed)
Ok to add on tomorrow pm

## 2022-09-21 ENCOUNTER — Ambulatory Visit (INDEPENDENT_AMBULATORY_CARE_PROVIDER_SITE_OTHER): Payer: Medicare Other | Admitting: Internal Medicine

## 2022-09-21 ENCOUNTER — Encounter: Payer: Self-pay | Admitting: Internal Medicine

## 2022-09-21 ENCOUNTER — Ambulatory Visit (HOSPITAL_COMMUNITY)
Admission: RE | Admit: 2022-09-21 | Discharge: 2022-09-21 | Disposition: A | Discharge status: Home / Self Care | Payer: Medicare Other | Source: Ambulatory Visit | Attending: Internal Medicine | Admitting: Internal Medicine

## 2022-09-21 VITALS — BP 137/64 | HR 69 | Ht 72.0 in | Wt 186.4 lb

## 2022-09-21 DIAGNOSIS — J449 Chronic obstructive pulmonary disease, unspecified: Secondary | ICD-10-CM | POA: Diagnosis not present

## 2022-09-21 DIAGNOSIS — J479 Bronchiectasis, uncomplicated: Secondary | ICD-10-CM

## 2022-09-21 DIAGNOSIS — R059 Cough, unspecified: Secondary | ICD-10-CM | POA: Diagnosis not present

## 2022-09-21 DIAGNOSIS — J22 Unspecified acute lower respiratory infection: Secondary | ICD-10-CM | POA: Diagnosis not present

## 2022-09-21 DIAGNOSIS — R918 Other nonspecific abnormal finding of lung field: Secondary | ICD-10-CM | POA: Diagnosis not present

## 2022-09-21 MED ORDER — FAMOTIDINE 20 MG PO TABS: ORAL_TABLET | ORAL | 11 refills | Status: DC

## 2022-09-21 MED ORDER — PANTOPRAZOLE SODIUM 40 MG PO TBEC
40.0000 mg | DELAYED_RELEASE_TABLET | Freq: Every day | ORAL | 2 refills | Status: DC
Start: 2022-09-21 — End: 2023-02-07

## 2022-09-21 MED ORDER — BENZONATATE 200 MG PO CAPS: 200.0000 mg | ORAL_CAPSULE | Freq: Three times a day (TID) | ORAL | 1 refills | Status: DC | PRN

## 2022-09-21 MED ORDER — AZITHROMYCIN 250 MG PO TABS: ORAL_TABLET | ORAL | 11 refills | Status: DC

## 2022-09-21 NOTE — Telephone Encounter (Signed)
Ov today with all meds in hand using a trust but verify approach to confirm accurate Medication  Reconciliation

## 2022-09-21 NOTE — Telephone Encounter (Signed)
Patient checking on message for cough. Patient phone number is [phone].

## 2022-09-21 NOTE — Patient Instructions (Addendum)
Try off symbicort for now and add back on Pantoprazole (protonix) 40 mg   Take  30-60 min before first meal of the day and Pepcid (famotidine)  20 mg after supper until return to office - this is the best way to tell whether stomach acid is contributing to your problem.    If mucus nasty or fever   > zpak   For try tickle > tessalon 200 mg up to 4 x daily   Please remember to go to the  x-ray department  @  [facility] for your tests - we will call you with the results when they are available      Please schedule a follow up office visit in 4 weeks, call sooner if needed with all medications /inhalers/ solutions in hand so we can verify exactly what you are taking. This includes all medications from all doctors and over the counters - PLEASE separate them into two bags:  the ones you take automatically, no matter what, vs the ones you take just when you feel you need them "BAG #2 is UP TO YOU"  - this will really help [place] help you take your medications more effectively.

## 2022-09-21 NOTE — Progress Notes (Unsigned)
[name] [name], male    DOB: 1945/09/08    MRN: [id number]  Brief patient profile:  66  yowm electrical engineer MM/quit all smoking 2016  with R Empyema maint on spriva dpi /ACEi with variable cough but no doe since 2016 previously followed by Dr Judee Clara referred to pulmonary clinic 05/21/2019 by Dr   Judee Clara    History of Present Illness  05/21/2019  Pulmonary/ 1st office eval/[name]  Chief Complaint  Patient presents with   Pulmonary Consult    Referred by Dr. Judee Clara for eval of COPD. Former Dr [name] pt. He is maintained on spiriva and does not use a rescue inhaler. He states his breathing is doing well currently.    Dyspnea:  3 x weekly treadmill x 50 min at 3.6 -4 and 4.5  Cough: comes and goes x years variably productive not noct  Sleep: does fine in recliner / can do flat in bed  SABA use: none  rec Try off spiriva to see if you notice any difference in your exercise tolerance or cough For cough > mucinex dm up to 1200 mg every 12 hours as needed  Please schedule a follow up visit in 2 months at [place] office with PFT's first if possible    07/05/2021  f/u ov/[name] office/Troi Florendo re: GOLD 2/ bronchiectasis maint on zpak prn   Chief Complaint  Patient presents with   Follow-up    SOB and cough are about the same since last ov.   wants refill for z pak  Dyspnea:  treadmill same tol/sats  Cough: sporadic but this winter was the worse p entresto started / been with mucinex dm but not on max doses / lots of am am mucus typically  Sleeping: bed blocks fine SABA use: none  02: none  Covid status: vax x 5  Lung cancer screening: not sure he's eligible Rec I will be referring you for lung cancer screening  - it actually isn't due until 03/11/22  The sacubitrilat component of entresto is not and ACEi but it does lead to higher levels of bradykinin Refill zpak  - should turn mucus clear or less dark Max dose guaifensin is 1200 mg every 12 hours and use the flutter valve if needed   Please schedule a follow up visit in 6  months but call sooner if needed      08/08/2022  3 m f/u ov/[name] office/[name] re: GOLD 2/bronchiectasis/ cough x 2016  maint on symbicort 80 2bid   Chief Complaint  Patient presents with   Follow-up    Breathing is about the same. He has occ cough- currently non prod. He does not have a rescue inhaler.   Dyspnea:  thinks anxiety is  hurting his ex routine  Cough: dry now/ recent rx zmax for fever s much change in mucus > resolved  Sleeping: 3 in blocks SABA use: don't use it  Rec I would prefer Dr Scharlene Gloss office fill your prescription for the valsartan since they are monitoring your kidney function closer than Dr Wyline Mood        09/21/2022  ACUTE  ov/ office/[name] re: GOLD 2/ bronchiectasis with cough x 2016  maint on symb 80 2 bid   Chief Complaint  Patient presents with   Acute Visit  Dyspnea:  treadmill x 30 min / 4 degrees and 4 mph and stops fatigue not doe x 3 x week Cough: in am  more often dry than wet but when productive = several times  a week  brings up small amounts  "completely  pink mucus" x months  Sleeping: bed is up 3 in no resp cc and no am congestion cough p stirs  SABA use: none  02: none    No obvious day to day or daytime variability or assoc excess/ purulent sputum or mucus plugs or hemoptysis or cp or chest tightness, subjective wheeze or overt sinus or hb symptoms.   Also denies any obvious fluctuation of symptoms with weather or environmental changes or other aggravating or alleviating factors except as outlined above   No unusual exposure hx or h/o childhood pna/ asthma or knowledge of premature birth.  Current Allergies, Complete Past Medical History, Past Surgical History, Family History, and Social History were reviewed in [employer] record.  ROS  The following are not active complaints unless bolded Hoarseness, sore throat, dysphagia, dental problems, itching, sneezing,   nasal congestion or discharge of excess mucus or purulent secretions, ear ache,   fever, chills, sweats, unintended wt loss or wt gain, classically pleuritic or exertional cp,  orthopnea pnd or arm/hand swelling  or leg swelling, presyncope, palpitations, abdominal pain, anorexia, nausea, vomiting, diarrhea  or change in bowel habits or change in bladder habits, change in stools or change in urine, dysuria, hematuria,  rash, arthralgias, visual complaints, headache, numbness, weakness or ataxia or problems with walking or coordination,  change in mood or  memory.        Current Meds  Medication Sig   acetaminophen (TYLENOL) 500 MG tablet Take 500 mg by mouth every 6 (six) hours as needed for moderate pain.   ALPRAZolam (XANAX) 0.25 MG tablet Take 0.25 mg by mouth at bedtime as needed for anxiety.   aspirin EC 81 MG tablet Take 1 tablet (81 mg total) by mouth daily.   budesonide-formoterol (SYMBICORT) 80-4.5 MCG/ACT inhaler Take 2 puffs first thing in am and then another 2 puffs about 12 hours later.   Calcium Citrate-Vitamin D (CALCIUM CITRATE + D3 PO) Take 1 tablet by mouth daily.   Carboxymethylcellul-Glycerin (LUBRICATING EYE DROPS OP) Place 1 drop into both eyes 2 (two) times daily. As needed   carvedilol (COREG) 6.25 MG tablet TAKE 1 TABLET(6.25 MG) BY MOUTH TWICE DAILY WITH A MEAL   cyclobenzaprine (FLEXERIL) 5 MG tablet TAKE 1 TABLET BY MOUTH EVERY NIGHT AT BEDTIME FOR LOWER BACK PAIN   Dextromethorphan-guaiFENesin (MUCUS RELIEF DM PO) Take by mouth.   FARXIGA 10 MG TABS tablet TAKE 1 TABLET(10 MG) BY MOUTH DAILY   Menthol, Topical Analgesic, (BIOFREEZE EX) Apply topically.   Multiple Vitamins-Minerals (SENIOR VITES PO) Take 1 tablet by mouth daily.   nitroGLYCERIN (NITROSTAT) 0.4 MG SL tablet Place 1 tablet (0.4 mg total) under the tongue every 5 (five) minutes as needed.   rosuvastatin (CRESTOR) 40 MG tablet TAKE 1 TABLET(40 MG) BY MOUTH DAILY   sertraline (ZOLOFT) 50 MG tablet Take 75 mg  by mouth daily.   tamsulosin (FLOMAX) 0.4 MG CAPS capsule Take 1 capsule (0.4 mg total) by mouth daily.   valsartan (DIOVAN) 80 MG tablet Take 80 mg by mouth daily.                 Past Medical History:  Diagnosis Date   Acute systolic heart failure (HCC)    COPD (chronic obstructive pulmonary disease) (HCC)    Enlarged prostate with lower urinary tract symptoms (LUTS)    Hyperlipidemia    Hypertension    Low serum testosterone level  PAF (paroxysmal atrial fibrillation) (HCC)    Pneumonia 05/2014   Pyothorax without fistula (HCC)    Sciatica    Shortness of breath dyspnea    Solitary pulmonary nodule    STEMI (ST elevation myocardial infarction) (HCC)    09/27/17 PCI/DES x1 mLAD, PTCA of the diag branch. EF 30% Lifevest at discharge.    Testicular hypofunction    Urinary frequency       Objective:    wts  09/21/2022          186 08/08/2022        188  05/05/2022        193  03/25/2022          190 02/21/2022        191 01/10/2022      187 07/05/2021        184 12/29/2020      182  06/23/2020          173   05/15/2020        174 04/27/2020          176  01/21/2020        192   12/13/19 192 lb 3.2 oz (87.2 kg)  08/06/19 198 lb (89.8 kg)  06/24/19 198 lb (89.8 kg)      Vital signs reviewed  09/21/2022  - Note at rest 02 sats  94% on RA   General appearance:    amb wm nad    HEENT : Oropharynx  clear       NECK :  without  apparent JVD/ palpable Nodes/TM    LUNGS: no acc muscle use,  Nl contour chest which is clear to A and P bilaterally without cough on insp or exp maneuvers   CV:  RRR  no s3 or murmur or increase in P2, and no edema   ABD:  soft and nontender with nl inspiratory excursion in the supine position. No bruits or organomegaly appreciated   MS:  Nl gait/ ext warm without deformities Or obvious joint restrictions  calf tenderness, cyanosis or clubbing    SKIN: warm and dry without lesions    NEURO:  alert, approp, nl sensorium with  no motor or  cerebellar deficits apparent.   CXR PA and Lateral:   09/21/2022 :    I personally reviewed images and impression is as follows:     No acute findings/ scarring R base      Assessment

## 2022-09-22 NOTE — Assessment & Plan Note (Signed)
See HRCT  02/18/20 Diffuse bronchial wall thickening with mild to moderate centrilobular and paraseptal emphysema. 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 - Flutter valve training 01/10/2022   - 03/25/22  daily zmax trial x one month no effect on low grade hemoptysis so d/c'd daily/ ok to use short term - HRCT chest  06/16/22 c/w bronchiectasis/ MAI   Continue muciex dm/ flutter valve and zpak prn flares with levaquin as a backup           Each maintenance medication was reviewed in detail including emphasizing most importantly the difference between maintenance and prns and under what circumstances the prns are to be triggered using an action plan format where appropriate.  Total time for H and P, chart review, counseling, reviewing hfa/flutter  device(s) and generating customized AVS unique to this office visit / same day charting = 31 min acute ov

## 2022-09-22 NOTE — Assessment & Plan Note (Signed)
MM/Quit all smoking 2016 with dx of R empyema with chronic cough since then - Try off spiriva 05/21/2019  - PFT's  08/06/19  FEV1 2.60 (75 % ) ratio 0.64  p 14 % improvement from saba p 0 prior to study with DLCO  18.09 (66%) corrects to 3.14 (79%)  for alv volume and FV curve minimally concave  -  04/27/2020   Walked RA  approx   600 ft  @ fast pace  stopped due to  Sob with sats 99%    -  04/27/2020     alpha one AT phenotype  MM level 141  - 05/15/2020  After extensive coaching inhaler device,  effectiveness =    75% from a baseline of well < 50%  So ok to rechallenge with prn saba - 03/25/22 rx symbicort 80 2bid  - 05/05/2022  After extensive coaching inhaler device,  effectiveness =    80% (late trigger) > continue symbicort 80 2bid  - 09/21/2022 try off symbicort as main concern is dry cough ? Gerd related > resume gerd rx Kimberlee Nearing 200  Pattern now is more c/w  Upper airway cough syndrome (previously labeled PNDS),  is so named because it's frequently impossible to sort out how much is  CR/sinusitis with freq throat clearing (which can be related to primary GERD)   vs  causing  secondary (" extra esophageal")  GERD from wide swings in gastric pressure that occur with throat clearing, often  promoting self use of mint and menthol lozenges that reduce the lower esophageal sphincter tone and exacerbate the problem further in a cyclical fashion.   These are the same pts (now being labeled as having "irritable larynx syndrome" by some cough centers) who not infrequently have a history of having failed to tolerate ace inhibitors,  dry powder inhalers or biphosphonates or report having atypical/extraesophageal reflux symptoms that don't respond to standard doses of PPI  and are easily confused as having aecopd or asthma flares by even experienced allergists/ pulmonologists (myself included).   Recs as above, regroiup in 4 weeks with all meds in hand using a trust but verify approach to confirm accurate Medication   Reconciliation The principal here is that until we are certain that the  patients are doing what we've asked, it makes no sense to ask them to do more.

## 2022-09-23 ENCOUNTER — Encounter: Payer: Self-pay | Admitting: Internal Medicine

## 2022-09-23 DIAGNOSIS — J479 Bronchiectasis, uncomplicated: Secondary | ICD-10-CM

## 2022-09-23 NOTE — Telephone Encounter (Signed)
Patient was seen on 09/21/2022.  Nothing further needed.

## 2022-09-25 ENCOUNTER — Encounter: Payer: Self-pay | Admitting: Internal Medicine

## 2022-09-28 NOTE — Telephone Encounter (Signed)
Would consider gm stain and culure and afb stain and culture in a fresh am specimen this week but no change in recs otherwise

## 2022-09-28 NOTE — Telephone Encounter (Signed)
Routing to Dr. Sherene Sires- more imaged attached of sputum pt produced.

## 2022-09-28 NOTE — Telephone Encounter (Signed)
Dr Sherene Sires- pt attached images of his sputum. FYI.

## 2022-09-29 DIAGNOSIS — M79671 Pain in right foot: Secondary | ICD-10-CM | POA: Diagnosis not present

## 2022-09-29 DIAGNOSIS — M722 Plantar fascial fibromatosis: Secondary | ICD-10-CM | POA: Diagnosis not present

## 2022-09-29 DIAGNOSIS — B351 Tinea unguium: Secondary | ICD-10-CM | POA: Diagnosis not present

## 2022-09-29 NOTE — Telephone Encounter (Signed)
Pt aware of the recommendations from Dr Sherene Sires  Lab ordered placed and sputum cups and directions for use up front for pick up at [place] office Nothing further needed

## 2022-09-30 ENCOUNTER — Other Ambulatory Visit: Payer: Self-pay

## 2022-09-30 ENCOUNTER — Other Ambulatory Visit: Payer: Self-pay | Admitting: Internal Medicine

## 2022-09-30 DIAGNOSIS — J479 Bronchiectasis, uncomplicated: Secondary | ICD-10-CM | POA: Diagnosis not present

## 2022-10-05 ENCOUNTER — Encounter: Payer: Self-pay | Admitting: Student

## 2022-10-05 ENCOUNTER — Ambulatory Visit: Payer: Medicare Other | Admitting: Student

## 2022-10-05 VITALS — BP 138/72 | HR 61 | Temp 98.1°F | Ht 72.0 in | Wt 187.0 lb

## 2022-10-05 DIAGNOSIS — I4891 Unspecified atrial fibrillation: Secondary | ICD-10-CM | POA: Diagnosis not present

## 2022-10-05 DIAGNOSIS — I739 Peripheral vascular disease, unspecified: Secondary | ICD-10-CM

## 2022-10-05 DIAGNOSIS — C679 Malignant neoplasm of bladder, unspecified: Secondary | ICD-10-CM | POA: Diagnosis not present

## 2022-10-05 DIAGNOSIS — E785 Hyperlipidemia, unspecified: Secondary | ICD-10-CM

## 2022-10-05 DIAGNOSIS — J449 Chronic obstructive pulmonary disease, unspecified: Secondary | ICD-10-CM

## 2022-10-05 DIAGNOSIS — J479 Bronchiectasis, uncomplicated: Secondary | ICD-10-CM | POA: Diagnosis not present

## 2022-10-05 DIAGNOSIS — I10 Essential (primary) hypertension: Secondary | ICD-10-CM

## 2022-10-05 DIAGNOSIS — N400 Enlarged prostate without lower urinary tract symptoms: Secondary | ICD-10-CM | POA: Diagnosis not present

## 2022-10-05 DIAGNOSIS — I5022 Chronic systolic (congestive) heart failure: Secondary | ICD-10-CM

## 2022-10-05 DIAGNOSIS — N1831 Chronic kidney disease, stage 3a: Secondary | ICD-10-CM | POA: Diagnosis not present

## 2022-10-05 DIAGNOSIS — Z955 Presence of coronary angioplasty implant and graft: Secondary | ICD-10-CM | POA: Diagnosis not present

## 2022-10-05 NOTE — Progress Notes (Signed)
Location:  [facility].   Provider: Dr. Earnestine Mealing  Code Status: DNR Goals of Care:     10/05/2022   12:38 PM  Advanced Directives  Does Patient Have a Medical Advance Directive? Yes  Type of Estate agent of [place];Living will;Out of facility DNR (pink MOST or yellow form)  Does patient want to make changes to medical advance directive? No - Patient declined  Copy of Healthcare Power of Attorney in Chart? Yes - validated most recent copy scanned in chart (See row information)     Chief Complaint  Patient presents with   Establish Care    To Establish Care.    Quality Metric Gaps    To discuss need for Covid.     HPI: Patient is a 77 y.o. male seen today to establish Care.   Medical history Empyema and bronchiectasis Heart attack in the past 2019  Hx of urithelial bladder cancer: [place] annual cystoscopy.  Anxiety/Depression/insomnia: he has been on and off of medications in the past. He is on Sertraline 75 mg a day and xanax as needed for sleep. He takes 0.25mg  if he wakes up at night-- either stiff in the back or going to the bathroom. He hasn't always had this issue -it's gotten worse in May after [place]. It was impacting his overall function. His primary care has been prescribing these medications. He takes the xanax nightly. He hasn't tried going without it during this stressful time. He has had two other episodes similar to this in the past and he has gotten off of them in the past without signs of addiction in the past.  CHF EF 45% stable. They are moving to [facility] from [place]. Welcome moving August 1 and then August 12th. Son will be here in September to go into their house. He takes 0.25mg  nightly. He may have enough to get him through September 1.  Constipation - occasional OTC medication.  BPH - He has some issues with urinary stream. Weak Stream, and some dribbling. He  periodically wears a pad. Well controlled with flomax  Medications Discussed Xanax- open to stopping after they are settled from their move.  Omeprazole and Pepcid for 1 month trial for cough. So far it hasn't made a difference.  Cyclobenzaprine - he had some muscle spasms that were keeping him up. Not taking it now at all  Mobility He had one fall in the last year. He was getting out of his Wrangler (no longer has this vehicle). He landed on one knee at that time and his head a bit. He saw ortho about it because. That was March 2024. No assistive devices at all. He walks on treadmill 3x a week. Totally independent. No assitance for dressing, showering. He manages their finances with his spouse. He makes frozen dinners, but his wife cooks. They get some deep cleaning hired, some cleaning.  Mentation:  He has walked into the wrong room. They were going somewhere they always go and he got lost. He knew how ot get back to where he was.   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 He preferred undergraduate education over research. He taught a bit at [employer] part time as well (until COVID around 2021). He lives here with his wife. His daughter is in state, and son is moving here from [facility] soon. They have  been married 56 years. HCPOA is Son.   Matters Most: Enjoys going to [employer], some body boarding, Pro football [place]).. Statistical sports game. Some needle point.   Dermatology - skin exams.  Past Medical History:  Diagnosis Date   Acute systolic heart failure (HCC)    Anxiety    COPD (chronic obstructive pulmonary disease) (HCC)    Enlarged prostate with lower urinary tract symptoms (LUTS)    GERD (gastroesophageal reflux disease)    Hyperlipidemia    Hypertension    Low serum testosterone level    PAF (paroxysmal atrial fibrillation) (HCC)    Pneumonia 05/2014   Pyothorax without fistula (HCC)    Sciatica     Shortness of breath dyspnea    Solitary pulmonary nodule    STEMI (ST elevation myocardial infarction) (HCC)    09/27/17 PCI/DES x1 mLAD, PTCA of the diag branch. EF 30% Lifevest at discharge.    Testicular hypofunction    Urinary frequency     Past Surgical History:  Procedure Laterality Date   APPENDECTOMY     BACK SURGERY     COLONOSCOPY  2011   Tubular adenoma per patient   COLONOSCOPY N/A 03/03/2015   Dr. Darrick Penna: Left colon is redundant, mild diverticulosis, small internal hemorrhoids, moderate external hemorrhoids.  Next colonoscopy in 5 years with overtube.   COLONOSCOPY N/A 03/25/2019   Procedure: COLONOSCOPY;  Surgeon: West Bali, MD;  [facility];  Service: Endoscopy;  Laterality: N/A;  8:30am - Camille's request   CORONARY ULTRASOUND/IVUS N/A 09/27/2017   Procedure: Intravascular Ultrasound/IVUS;  Surgeon: Kathleene Hazel, MD;  [facility]: [facility];  Service: Cardiovascular;  Laterality: N/A;   CORONARY/GRAFT ACUTE MI REVASCULARIZATION N/A 09/27/2017   Procedure: Coronary/Graft Acute MI Revascularization;  Surgeon: Kathleene Hazel, MD;  [facility]: [facility];  Service: Cardiovascular;  Laterality: N/A;   CYSTOSCOPY W/ RETROGRADES Bilateral 06/16/2020   Procedure: CYSTOSCOPY WITH RETROGRADE PYELOGRAM;  Surgeon: Riki Altes, MD;  Location: [facility] ORS;  Service: Urology;  Laterality: Bilateral;   EMPYEMA DRAINAGE Right 06/27/2014   Procedure: EMPYEMA DRAINAGE;  Surgeon: Delight Ovens, MD;  Location: [facility] OR;  Service: Thoracic;  Laterality: Right;   HEMORRHOID BANDING N/A 03/03/2015   Procedure: [place];  Surgeon: West Bali, MD;  [facility];  Service: Endoscopy;  Laterality: N/A;  Recomended to see a Careers adviser.   HERNIA REPAIR     LEFT HEART CATH AND CORONARY ANGIOGRAPHY N/A 09/27/2017   Procedure: LEFT HEART CATH AND CORONARY ANGIOGRAPHY;  Surgeon: Kathleene Hazel, MD;  [facility];   Service: Cardiovascular;  Laterality: N/A;   LUNG SURGERY  2016   POLYPECTOMY  03/25/2019   Procedure: POLYPECTOMY;  Surgeon: West Bali, MD;  [facility];  Service: Endoscopy;;   TEE WITHOUT CARDIOVERSION N/A 07/14/2014   Procedure: TRANSESOPHAGEAL ECHOCARDIOGRAM (TEE);  Surgeon: Quintella Reichert, MD;  Location: [facility] ENDOSCOPY;  Service: Cardiovascular;  Laterality: N/A;   TRANSURETHRAL RESECTION OF BLADDER TUMOR N/A 06/16/2020   Procedure: TRANSURETHRAL RESECTION OF BLADDER TUMOR (TURBT) WITH GEMCITABINE;  Surgeon: Riki Altes, MD;  Location: [facility] ORS;  Service: Urology;  Laterality: N/A;   VARICOSE VEIN SURGERY     VIDEO ASSISTED THORACOSCOPY Right 06/27/2014   Procedure: VIDEO ASSISTED THORACOSCOPY;  Surgeon: Delight Ovens, MD;  Location: [facility] OR;  Service: Thoracic;  Laterality: Right;   VIDEO BRONCHOSCOPY N/A 06/27/2014   Procedure: VIDEO BRONCHOSCOPY;  Surgeon:  Delight Ovens, MD;  Location: [facility] OR;  Service: Thoracic;  Laterality: N/A;    No Known Allergies  Outpatient Encounter Medications as of 10/05/2022  Medication Sig   acetaminophen (TYLENOL) 500 MG tablet Take 500 mg by mouth every 6 (six) hours as needed for moderate pain.   ALPRAZolam (XANAX) 0.25 MG tablet Take 0.25 mg by mouth at bedtime as needed for anxiety.   aspirin EC 81 MG tablet Take 1 tablet (81 mg total) by mouth daily.   benzonatate (TESSALON) 200 MG capsule Take 1 capsule (200 mg total) by mouth 3 (three) times daily as needed for cough.   Calcium Citrate-Vitamin D (CALCIUM CITRATE + D3 PO) Take 1 tablet by mouth daily.   Carboxymethylcellul-Glycerin (LUBRICATING EYE DROPS OP) Place 1 drop into both eyes 2 (two) times daily. As needed   carvedilol (COREG) 6.25 MG tablet TAKE 1 TABLET(6.25 MG) BY MOUTH TWICE DAILY WITH A MEAL   cyclobenzaprine (FLEXERIL) 5 MG tablet Take 5 mg by mouth at bedtime. As needed for lower back pain.   Dextromethorphan-guaiFENesin (MUCUS RELIEF DM PO) Take by mouth. As  needed.   famotidine (PEPCID) 20 MG tablet One after supper   FARXIGA 10 MG TABS tablet TAKE 1 TABLET(10 MG) BY MOUTH DAILY   Menthol, Topical Analgesic, (BIOFREEZE EX) Apply topically.   Multiple Vitamins-Minerals (SENIOR VITES PO) Take 1 tablet by mouth daily.   nitroGLYCERIN (NITROSTAT) 0.4 MG SL tablet Place 1 tablet (0.4 mg total) under the tongue every 5 (five) minutes as needed.   pantoprazole (PROTONIX) 40 MG tablet Take 1 tablet (40 mg total) by mouth daily. Take 30-60 min before first meal of the day   rosuvastatin (CRESTOR) 40 MG tablet TAKE 1 TABLET(40 MG) BY MOUTH DAILY   sertraline (ZOLOFT) 50 MG tablet Take 75 mg by mouth daily.   tamsulosin (FLOMAX) 0.4 MG CAPS capsule Take 1 capsule (0.4 mg total) by mouth daily.   valsartan (DIOVAN) 80 MG tablet Take 80 mg by mouth daily.   [DISCONTINUED] azithromycin (ZITHROMAX) 250 MG tablet Take 2 on day one then 1 daily x 4 days   [DISCONTINUED] budesonide-formoterol (SYMBICORT) 80-4.5 MCG/ACT inhaler Take 2 puffs first thing in am and then another 2 puffs about 12 hours later.   [DISCONTINUED] cyclobenzaprine (FLEXERIL) 5 MG tablet TAKE 1 TABLET BY MOUTH EVERY NIGHT AT BEDTIME FOR LOWER BACK PAIN (Patient taking differently: TAKE 1 TABLET BY MOUTH EVERY NIGHT AT BEDTIME FOR LOWER BACK PAIN as needed)   No facility-administered encounter medications on file as of 10/05/2022.    Review of Systems:  Review of Systems  Health Maintenance  Topic Date Due   COVID-19 Vaccine (9 - 2023-24 season) 07/25/2022   INFLUENZA VACCINE  10/20/2022   Medicare Annual Wellness (AWV)  03/10/2023   DTaP/Tdap/Td (2 - Td or Tdap) 08/09/2023   Pneumonia Vaccine 60+ Years old  Completed   Hepatitis C Screening  Completed   Zoster Vaccines- Shingrix  Completed   HPV VACCINES  Aged Out   Colonoscopy  Discontinued    Physical Exam: Vitals:   10/05/22 1236  BP: 138/72  Pulse: 61  Temp: 98.1 F (36.7 C)  SpO2: 96%  Weight: 187 lb (84.8 kg)  Height:  6' (1.829 m)   Body mass index is 25.36 kg/m. Physical Exam Vitals reviewed.  Constitutional:      Appearance: Normal appearance.  HENT:     Head: Normocephalic.     Right Ear: Tympanic membrane normal.  Left Ear: Tympanic membrane normal.     Nose: Nose normal.     Mouth/Throat:     Mouth: Mucous membranes are moist.  Eyes:     Pupils: Pupils are equal, round, and reactive to light.  Cardiovascular:     Rate and Rhythm: Normal rate and regular rhythm.     Pulses: Normal pulses.     Heart sounds: Normal heart sounds.  Pulmonary:     Effort: Pulmonary effort is normal.     Breath sounds: Normal breath sounds.  Abdominal:     General: Abdomen is flat.     Palpations: Abdomen is soft.  Musculoskeletal:        General: Normal range of motion.  Skin:    General: Skin is warm and dry.  Neurological:     Mental Status: He is alert and oriented to person, place, and time.  Psychiatric:        Mood and Affect: Mood normal.     Labs reviewed: Basic Metabolic Panel: Recent Labs    01/10/22 1049 01/21/22 0945 02/16/22 1111  NA 140 142 143  K 5.5* 5.3* 5.2  CL 101 102 104  CO2 23 29 24   GLUCOSE 92 76 88  BUN 28* 28* 24  CREATININE 1.41* 1.39* 1.57*  CALCIUM 9.4 9.6 9.5   Liver Function Tests: No results for input(s): "AST", "ALT", "ALKPHOS", "BILITOT", "PROT", "ALBUMIN" in the last 8760 hours. No results for input(s): "LIPASE", "AMYLASE" in the last 8760 hours. No results for input(s): "AMMONIA" in the last 8760 hours. CBC: Recent Labs    01/10/22 1049  WBC 6.5  NEUTROABS 4.2  HGB 15.1  HCT 45.9  MCV 96  PLT 239   Lipid Panel: No results for input(s): "CHOL", "HDL", "LDLCALC", "TRIG", "CHOLHDL", "LDLDIRECT" in the last 8760 hours. No results found for: "HGBA1C"  Procedures since last visit: DG Chest 2 View  Result Date: 09/26/2022 CLINICAL DATA:  History of COPD, cough EXAM: CHEST - 2 VIEW COMPARISON:  Chest x-ray April 27, 2020, chest CT June 16, 2022 FINDINGS: The cardiomediastinal silhouette is unchanged in contour. Unchanged chronic heterogeneous right basilar pulmonary opacities. No new focal pulmonary opacity. No pleural effusion or pneumothorax. The visualized upper abdomen is unremarkable. No acute osseous abnormality. IMPRESSION: 1. Unchanged chronic heterogeneous right basilar pulmonary opacities, previously characterized as chronic atypical infection on recent chest CT. 2. No acute cardiopulmonary abnormality. Electronically Signed   By: Jacob Moores M.D.   On: 09/26/2022 16:22    Assessment/Plan 1. Peripheral vascular disease, unspecified (HCC) Patient without pain at this time. No smoking. Continue crestor and ASA.   2. Stage 3a chronic kidney disease (HCC) Kidney disease stable per most recent labs. Labs q6 mo.   3. Urothelial carcinoma of bladder (HCC) No symptoms at this time. S/p treatment. Surveillance with urology.   4. Atrial fibrillation, unspecified type (HCC) Rate well-controlled.   5. Bronchiectasis without complication (HCC) Scarring 2/2 hx of empyema, followed by pulmonology. Chronic cough. No improvement with pepcid, however, just started the trial. Discuss at f/u.   6. Status post coronary artery stent placement Hx of MI. No chest pain, continue Crestor, ASA, and PRN nitroglycerin.   7. Hyperlipidemia, unspecified hyperlipidemia type Annual labs, will collect at next visit.   8. Chronic obstructive pulmonary disease, unspecified COPD type (HCC) Stable without meds at this time. Continue to monitor.   9. Benign prostatic hyperplasia without lower urinary tract symptoms Symptoms well-controlled with flomax at this time.  10. Chronic systolic HF Euvolemic on exam. On goal directed therapy with Farxiga, Valsartan, Carvedilol. Continue to monitor.   Labs/tests ordered:  * No order type specified * Next appt:  Visit date not found  I spent greater than 75  minutes for the care of this patient in  face to face time, chart review, clinical documentation, patient education. I spent an additional 16 minutes discussing goals of care and advanced care planning.

## 2022-10-07 ENCOUNTER — Encounter: Payer: Self-pay | Admitting: Student

## 2022-10-08 DIAGNOSIS — N1831 Chronic kidney disease, stage 3a: Secondary | ICD-10-CM | POA: Insufficient documentation

## 2022-10-08 DIAGNOSIS — I739 Peripheral vascular disease, unspecified: Secondary | ICD-10-CM | POA: Insufficient documentation

## 2022-10-11 DIAGNOSIS — Z23 Encounter for immunization: Secondary | ICD-10-CM | POA: Diagnosis not present

## 2022-10-17 NOTE — Progress Notes (Unsigned)
[name] [name], male    DOB: 05-Mar-1946    MRN: [id number]  Brief patient profile:  64  yowm electrical engineer MM/quit all smoking 2016  with R Empyema maint on spriva dpi /ACEi with variable cough but no doe since 2016 previously followed by [name] [name] referred to pulmonary clinic 05/21/2019 by [name]   Judee [name]    History of Present Illness  05/21/2019  Pulmonary/ 1st office eval/[name] [name]  Chief Complaint  Patient presents with   Pulmonary Consult    Referred by [name]. Judee [name] for eval of COPD. Former [name] [name] pt. He is maintained on spiriva and does not use a rescue inhaler. He states his breathing is doing well currently.    Dyspnea:  3 x weekly treadmill x 50 min at 3.6 -4 and 4.5  Cough: comes and goes x years variably productive not noct  Sleep: does fine in recliner / can do flat in bed  SABA use: none  rec Try off spiriva to see if you notice any difference in your exercise tolerance or cough For cough > mucinex dm up to 1200 mg every 12 hours as needed  Please schedule a follow up visit in 2 months at [place] office with PFT's first if possible    07/05/2021  f/u ov/[name] office/[name] [name] re: GOLD 2/ bronchiectasis maint on zpak prn   Chief Complaint  Patient presents with   Follow-up    SOB and cough are about the same since last ov.   wants refill for z pak  Dyspnea:  treadmill same tol/sats  Cough: sporadic but this winter was the worse p entresto started / been with mucinex dm but not on max doses / lots of am am mucus typically  Sleeping: bed blocks fine SABA use: none  02: none  Covid status: vax x 5  Lung cancer screening: not sure he's eligible Rec I will be referring you for lung cancer screening  - it actually isn't due until 03/11/22  The sacubitrilat component of entresto is not and ACEi but it does lead to higher levels of bradykinin Refill zpak  - should turn mucus clear or less dark Max dose guaifensin is 1200 mg every 12 hours and use the flutter valve if needed   Please schedule a follow up visit in 6  months but call sooner if needed      08/08/2022  3 m f/u ov/[name] office/[name] [name] re: GOLD 2/bronchiectasis/ cough x 2016  maint on symbicort 80 2bid   Chief Complaint  Patient presents with   Follow-up    Breathing is about the same. He has occ cough- currently non prod. He does not have a rescue inhaler.   Dyspnea:  thinks anxiety is  hurting his ex routine  Cough: dry now/ recent rx zmax for fever s much change in mucus > resolved  Sleeping: 3 in blocks SABA use: don't use it  Rec I would prefer [name] [name] office fill your prescription for the valsartan since they are monitoring your kidney function closer than [name] [name]        09/21/2022  ACUTE  ov/[place] office/[name] [name] re: GOLD 2/ bronchiectasis with cough x 2016  maint on symb 80 2 bid   Chief Complaint  Patient presents with   Acute Visit  Dyspnea:  treadmill x 30 min / 4 degrees and 4 mph and stops fatigue not doe x 3 x week Cough: in am  more often dry than wet but when productive = several times  a week  brings up small amounts  "completely  pink mucus" x months  Sleeping: bed is up 3 in no resp cc and no am congestion cough p stirs  SABA use: none  02: none  Rec Try off symbicort for now and add back on Pantoprazole (protonix) 40 mg   Take  30-60 min before first meal of the day and Pepcid (famotidine)  20 mg after supper until return to office  If mucus nasty or fever   > zpak  For try tickle > tessalon 200 mg up to 4 x daily   Please schedule a follow up office visit in 4 weeks, call sooner if needed with all medications /inhalers/ solutions in hand    10/18/2022  f/u ov/[name] office/[name] [name] re: GOLD 2/ bronchiectasis maint off  symbicort  and on max gerd rx / improved overall off symb (? Due to upper airway side effects resolved off ICS)  Chief Complaint  Patient presents with   COPD    mixed  Dyspnea:  doing fine on ex s symb Cough: using less mucinex dm > mucus still  very slt pinkish/ tesssilon did not work as well candy and mucinex dm and flutter valve  Sleeping: 3 in elevation hob/ no noct cough / some after stirring better up to 2 hours  SABA use: none  02: none      No obvious day to day or daytime variability or assoc excess/ purulent sputum or mucus plugs or  cp or chest tightness, subjective wheeze or overt sinus or hb symptoms.     Also denies any obvious fluctuation of symptoms with weather or environmental changes or other aggravating or alleviating factors except as outlined above   No unusual exposure hx or h/o childhood pna/ asthma or knowledge of premature birth.  Current Allergies, Complete Past Medical History, Past Surgical History, Family History, and Social History were reviewed in [employer] [facility] record.  ROS  The following are not active complaints unless bolded Hoarseness, sore throat, dysphagia, dental problems, itching, sneezing,  nasal congestion or discharge of excess mucus or purulent secretions, ear ache,   fever, chills, sweats, unintended wt loss or wt gain, classically pleuritic or exertional cp,  orthopnea pnd or arm/hand swelling  or leg swelling, presyncope, palpitations, abdominal pain, anorexia, nausea, vomiting, diarrhea  or change in bowel habits or change in bladder habits, change in stools or change in urine, dysuria, hematuria,  rash, arthralgias, visual complaints, headache, numbness, weakness or ataxia or problems with walking or coordination,  change in [name] or  memory.        Current Meds  Medication Sig   acetaminophen (TYLENOL) 500 MG tablet Take 500 mg by mouth every 6 (six) hours as needed for moderate pain.   ALPRAZolam (XANAX) 0.25 MG tablet Take 0.25 mg by mouth at bedtime as needed for anxiety.   aspirin EC 81 MG tablet Take 1 tablet (81 mg total) by mouth daily.   Calcium Citrate-Vitamin D (CALCIUM CITRATE + D3 PO) Take 1 tablet by mouth daily.   Carboxymethylcellul-Glycerin  (LUBRICATING EYE DROPS OP) Place 1 drop into both eyes 2 (two) times daily. As needed   carvedilol (COREG) 6.25 MG tablet TAKE 1 TABLET(6.25 MG) BY MOUTH TWICE DAILY WITH A MEAL   cyclobenzaprine (FLEXERIL) 5 MG tablet Take 5 mg by mouth at bedtime. As needed for lower back pain.   Dextromethorphan-guaiFENesin (MUCUS RELIEF DM PO) Take by mouth. As needed.   famotidine (PEPCID) 20  MG tablet One after supper   FARXIGA 10 MG TABS tablet TAKE 1 TABLET(10 MG) BY MOUTH DAILY   Menthol, Topical Analgesic, (BIOFREEZE EX) Apply topically.   Multiple Vitamins-Minerals (SENIOR VITES PO) Take 1 tablet by mouth daily.   nitroGLYCERIN (NITROSTAT) 0.4 MG SL tablet Place 1 tablet (0.4 mg total) under the tongue every 5 (five) minutes as needed.   pantoprazole (PROTONIX) 40 MG tablet Take 1 tablet (40 mg total) by mouth daily. Take 30-60 min before first meal of the day   rosuvastatin (CRESTOR) 40 MG tablet TAKE 1 TABLET(40 MG) BY MOUTH DAILY   sertraline (ZOLOFT) 25 MG tablet Take 25 mg by mouth daily.   sertraline (ZOLOFT) 50 MG tablet Take 75 mg by mouth daily.   tamsulosin (FLOMAX) 0.4 MG CAPS capsule Take 1 capsule (0.4 mg total) by mouth daily.   valsartan (DIOVAN) 80 MG tablet Take 80 mg by mouth daily.                Past Medical History:  Diagnosis Date   Acute systolic heart failure (HCC)    COPD (chronic obstructive pulmonary disease) (HCC)    Enlarged prostate with lower urinary tract symptoms (LUTS)    Hyperlipidemia    Hypertension    Low serum testosterone level    PAF (paroxysmal atrial fibrillation) (HCC)    Pneumonia 05/2014   Pyothorax without fistula (HCC)    Sciatica    Shortness of breath dyspnea    Solitary pulmonary nodule    STEMI (ST elevation myocardial infarction) (HCC)    09/27/17 PCI/DES x1 mLAD, PTCA of the diag branch. EF 30% Lifevest at discharge.    Testicular hypofunction    Urinary frequency       Objective:    wts  10/18/2022        186  09/21/2022           186 08/08/2022        188  05/05/2022        193  03/25/2022          190 02/21/2022        191 01/10/2022      187 07/05/2021        184 12/29/2020      182  06/23/2020          173   05/15/2020        174 04/27/2020          176  01/21/2020        192   12/13/19 192 lb 3.2 oz (87.2 kg)  08/06/19 198 lb (89.8 kg)  06/24/19 198 lb (89.8 kg)    Vital signs reviewed  10/18/2022  - Note at rest 02 sats  96% on RA   General appearance:    robust amb wm nad      HEENT : Oropharynx  clear     Nasal turbinates nl    NECK :  without  apparent JVD/ palpable Nodes/TM    LUNGS: no acc muscle use,  Nl contour chest with very minimal insp/exp rhonchi   bilaterally without cough on insp or exp maneuvers   CV:  RRR  no s3 or murmur or increase in P2, and no edema   ABD:  soft and nontender with nl inspiratory excursion in the supine position. No bruits or organomegaly appreciated   MS:  Nl gait/ ext warm without deformities Or obvious joint restrictions  calf tenderness, cyanosis or clubbing  SKIN: warm and dry without lesions    NEURO:  alert, approp, nl sensorium with  no motor or cerebellar deficits apparent.          Assessment

## 2022-10-18 ENCOUNTER — Encounter: Payer: Self-pay | Admitting: Internal Medicine

## 2022-10-18 ENCOUNTER — Ambulatory Visit: Payer: Medicare Other | Admitting: Internal Medicine

## 2022-10-18 VITALS — BP 117/68 | HR 70 | Ht 72.0 in | Wt 186.0 lb

## 2022-10-18 DIAGNOSIS — J479 Bronchiectasis, uncomplicated: Secondary | ICD-10-CM

## 2022-10-18 DIAGNOSIS — J449 Chronic obstructive pulmonary disease, unspecified: Secondary | ICD-10-CM | POA: Diagnosis not present

## 2022-10-18 NOTE — Assessment & Plan Note (Signed)
MM/Quit all smoking 2016 with dx of R empyema with chronic cough since then - Try off spiriva 05/21/2019  - PFT's  08/06/19  FEV1 2.60 (75 % ) ratio 0.64  p 14 % improvement from saba p 0 prior to study with DLCO  18.09 (66%) corrects to 3.14 (79%)  for alv volume and FV curve minimally concave  -  04/27/2020   Walked RA  approx   600 ft  @ fast pace  stopped due to  Sob with sats 99%    -  04/27/2020     alpha one AT phenotype  MM level 141  - 05/15/2020  After extensive coaching inhaler device,  effectiveness =    75% from a baseline of well < 50%  So ok to rechallenge with prn saba - 03/25/22 rx symbicort 80 2bid  - 05/05/2022  After extensive coaching inhaler device,  effectiveness =    80% (late trigger) > continue symbicort 80 2bid  - 09/21/2022 try off symbicort as main concern is dry cough ? Gerd related > resume gerd rx Kimberlee Nearing 200> resolved to his satisfaction so 10/18/2022 rec leave off symbicort unless doe or need for saba

## 2022-10-18 NOTE — Assessment & Plan Note (Signed)
See HRCT  02/18/20 Diffuse bronchial wall thickening with mild to moderate centrilobular and paraseptal emphysema. 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 - Flutter valve training 01/10/2022   - 03/25/22  daily zmax trial x one month no effect on low grade hemoptysis so d/c'd  - HRCT chest  06/16/22 c/w bronchiectasis/ MAI   Unable to obtain / process sputum in timely fashion but not needed at this point.  He is moving to [place] and if has flares there and regroup and obtain a new fresh sputum specimen thru the Lab there and have referred him to the pulmonary clinic for f/u with VEST maybe the next option if flaring despite standard rx. Leaving off low dose symbicort for now due to apparent irritation of the UACS component with ICS even in low dose form.   In the meantime For cough/ congestion >    mucinex dm  up to maximum of  1200 mg every 12 hours and use the flutter valve as much as you can    Discussed in detail all the  indications, usual  risks and alternatives  relative to the benefits with patient who agrees to proceed with Rx as outlined.             Each maintenance medication was reviewed in detail including emphasizing most importantly the difference between maintenance and prns and under what circumstances the prns are to be triggered using an action plan format where appropriate.  Total time for H and P, chart review, counseling, reviewing hfa/flutter   device(s) and generating customized AVS unique to this office visit / same day charting > 30 min summary final f/u ov

## 2022-10-18 NOTE — Patient Instructions (Addendum)
For cough/ congestion > mucinex dm  up to maximum of  1200 mg every 12 hours and use the flutter valve as much as you can    No change in recommendations - I would keep the symbicort on hand in case of worse breathing.  I am referring you the  pulmonary clinic in [place]

## 2022-11-11 DIAGNOSIS — Z23 Encounter for immunization: Secondary | ICD-10-CM | POA: Diagnosis not present

## 2022-11-24 DIAGNOSIS — T161XXA Foreign body in right ear, initial encounter: Secondary | ICD-10-CM | POA: Diagnosis not present

## 2022-11-24 DIAGNOSIS — H903 Sensorineural hearing loss, bilateral: Secondary | ICD-10-CM | POA: Diagnosis not present

## 2022-12-05 ENCOUNTER — Ambulatory Visit: Payer: Medicare Other | Admitting: Student

## 2022-12-06 ENCOUNTER — Encounter: Payer: Self-pay | Admitting: Cardiology

## 2022-12-06 ENCOUNTER — Ambulatory Visit: Payer: Medicare Other | Attending: Cardiology | Admitting: Cardiology

## 2022-12-06 VITALS — BP 116/58 | HR 53 | Ht 72.0 in | Wt 186.2 lb

## 2022-12-06 DIAGNOSIS — R002 Palpitations: Secondary | ICD-10-CM | POA: Insufficient documentation

## 2022-12-06 DIAGNOSIS — E782 Mixed hyperlipidemia: Secondary | ICD-10-CM | POA: Insufficient documentation

## 2022-12-06 DIAGNOSIS — I2510 Atherosclerotic heart disease of native coronary artery without angina pectoris: Secondary | ICD-10-CM | POA: Diagnosis present

## 2022-12-06 DIAGNOSIS — I5022 Chronic systolic (congestive) heart failure: Secondary | ICD-10-CM | POA: Insufficient documentation

## 2022-12-06 NOTE — Progress Notes (Signed)
Clinical Summary [name] [name] is a 77 y.o.male seen today for follow up of the following medical problems.    1. CAD/ICM/Chronic systolic HF - admit 09/2017 with STEMI. Received DES to LAD, PTCA of diag - 09/2017 echo LVEF 30-35%. He was discharged with lifevest for primary prevention - repeat 10/2017 35-40%, lifevest discontinued - given afib was started on plavix, asa, and anticoag x 1 month, then plavix and anticoag.      02/2019 echo: LVEF 40-45%, grade I diasotlic dysfunction  10/2020 echo: LVEF 40-45%, grade I dd, normal RV, mild MR    - rise in Cr, pulm lowered valsartan dose   Off entresto due to cough, changed to valsartan - off aldactone due to high K.   - no recent SOB/DOE - 3 times a week he wals on treadmill 30-35 minutes without exertional symptoms - compliant with meds     2. COPD/Chronic cough - followed by [name] [name] - entresto changed to valsartan by pulmonary      3. Isolated Afib in setting of STEMI - new diagnosis during his admission with acute MI. Unclear if isolated in setting of MI  02/2018 event monitor without recurrent afib, eliquis was stopped   - phone call 10/20/21 with palpitations - 10/2021 monitor with PACs, PVCs, rare short runs SVT longest 9 beats - weaning caffeine.    - denies significant palpitations.      4. Hyperlipidemia  Jan 2022 TC 99 TG 71 HDL 47 LDL 37 - he is on rosuvatatin   - 02/2021 TC 116 TG 59 HDL 43 LDL 59 - 10/2021 TC 99 TG 58 HDL 44 LDL 42 - 07/2022 TC 95 HDL 47 LDL 15   5. Bladder tumor -  surgery 06/16/20 . The resected tumor was a low-grade urothelial carcinoma the bladder.  No muscle invasion present.    6. Depression   [facility] a [facility] of time at [facility] where he has a place.  Recentlty moved to [place], living in a continous retirement community.   Past Medical History:  Diagnosis Date   Acute systolic heart failure (HCC)    Anxiety    COPD (chronic obstructive pulmonary disease) (HCC)     Enlarged prostate with lower urinary tract symptoms (LUTS)    GERD (gastroesophageal reflux disease)    Hyperlipidemia    Hypertension    Low serum testosterone level    PAF (paroxysmal atrial fibrillation) (HCC)    Pneumonia 05/2014   Pyothorax without fistula (HCC)    Sciatica    Shortness of breath dyspnea    Solitary pulmonary nodule    STEMI (ST elevation myocardial infarction) (HCC)    09/27/17 PCI/DES x1 mLAD, PTCA of the diag [name] [name]. EF 30% Lifevest at discharge.    Testicular hypofunction    Urinary frequency      No Known Allergies   Current Outpatient Medications  Medication Sig Dispense Refill   acetaminophen (TYLENOL) 500 MG tablet Take 500 mg by mouth every 6 (six) hours as needed for moderate pain.     ALPRAZolam (XANAX) 0.25 MG tablet Take 0.25 mg by mouth at bedtime as needed for anxiety.     aspirin EC 81 MG tablet Take 1 tablet (81 mg total) by mouth daily. 90 tablet 3   Calcium Citrate-Vitamin D (CALCIUM CITRATE + D3 PO) Take 1 tablet by mouth daily.     Carboxymethylcellul-Glycerin (LUBRICATING EYE DROPS OP) Place 1 drop into both eyes 2 (two) times daily.  As needed     carvedilol (COREG) 6.25 MG tablet TAKE 1 TABLET(6.25 MG) BY MOUTH TWICE DAILY WITH A MEAL 180 tablet 3   cyclobenzaprine (FLEXERIL) 5 MG tablet Take 5 mg by mouth at bedtime. As needed for lower back pain.     Dextromethorphan-guaiFENesin (MUCUS RELIEF DM PO) Take by mouth. As needed.     famotidine (PEPCID) 20 MG tablet One after supper 30 tablet 11   FARXIGA 10 MG TABS tablet TAKE 1 TABLET(10 MG) BY MOUTH DAILY 90 tablet 3   Menthol, Topical Analgesic, (BIOFREEZE EX) Apply topically.     Multiple Vitamins-Minerals (SENIOR VITES PO) Take 1 tablet by mouth daily.     nitroGLYCERIN (NITROSTAT) 0.4 MG SL tablet Place 1 tablet (0.4 mg total) under the tongue every 5 (five) minutes as needed. 25 tablet 3   pantoprazole (PROTONIX) 40 MG tablet Take 1 tablet (40 mg total) by mouth daily. Take 30-60 min  before first meal of the day 30 tablet 2   rosuvastatin (CRESTOR) 40 MG tablet TAKE 1 TABLET(40 MG) BY MOUTH DAILY 90 tablet 3   sertraline (ZOLOFT) 25 MG tablet Take 25 mg by mouth daily.     sertraline (ZOLOFT) 50 MG tablet Take 75 mg by mouth daily.     tamsulosin (FLOMAX) 0.4 MG CAPS capsule Take 1 capsule (0.4 mg total) by mouth daily. 30 capsule 2   valsartan (DIOVAN) 80 MG tablet Take 80 mg by mouth daily.     No current facility-administered medications for this visit.     Past Surgical History:  Procedure Laterality Date   APPENDECTOMY     BACK SURGERY     COLONOSCOPY  2011   Tubular adenoma per patient   COLONOSCOPY N/A 03/03/2015   [name]. Darrick [name]: Left colon is redundant, mild diverticulosis, small internal hemorrhoids, moderate external hemorrhoids.  Next colonoscopy in 5 years with overtube.   COLONOSCOPY N/A 03/25/2019   Procedure: COLONOSCOPY;  Surgeon: [name] Bali, MD;  Location: [facility];  Service: Endoscopy;  Laterality: N/A;  8:30am - [name] [name]   CORONARY ULTRASOUND/IVUS N/A 09/27/2017   Procedure: Intravascular Ultrasound/IVUS;  Surgeon: [name] Hazel, MD;  Location: [facility];  Service: Cardiovascular;  Laterality: N/A;   CORONARY/GRAFT ACUTE MI REVASCULARIZATION N/A 09/27/2017   Procedure: Coronary/Graft Acute MI Revascularization;  Surgeon: [name] Hazel, MD;  Location: [facility];  Service: Cardiovascular;  Laterality: N/A;   CYSTOSCOPY W/ RETROGRADES Bilateral 06/16/2020   Procedure: CYSTOSCOPY WITH RETROGRADE PYELOGRAM;  Surgeon: [name] Altes, MD;  Location: [facility] ORS;  Service: Urology;  Laterality: Bilateral;   EMPYEMA DRAINAGE Right 06/27/2014   Procedure: EMPYEMA DRAINAGE;  Surgeon: [name] Ovens, MD;  Location: [facility] OR;  Service: Thoracic;  Laterality: Right;   HEMORRHOID BANDING N/A 03/03/2015   Procedure: [name] [name];  Surgeon: [name] Bali, MD;  Location: [facility];  Service: Endoscopy;   Laterality: N/A;  Recomended to see a Careers adviser.   HERNIA REPAIR     LEFT HEART CATH AND CORONARY ANGIOGRAPHY N/A 09/27/2017   Procedure: LEFT HEART CATH AND CORONARY ANGIOGRAPHY;  Surgeon: [name] Hazel, MD;  Location: [facility];  Service: Cardiovascular;  Laterality: N/A;   LUNG SURGERY  2016   POLYPECTOMY  03/25/2019   Procedure: POLYPECTOMY;  Surgeon: [name] Bali, MD;  Location: [facility];  Service: Endoscopy;;   TEE WITHOUT CARDIOVERSION N/A 07/14/2014   Procedure: TRANSESOPHAGEAL ECHOCARDIOGRAM (TEE);  Surgeon: Quintella Reichert,  MD;  Location: [facility];  Service: Cardiovascular;  Laterality: N/A;   TRANSURETHRAL RESECTION OF BLADDER TUMOR N/A 06/16/2020   Procedure: TRANSURETHRAL RESECTION OF BLADDER TUMOR (TURBT) WITH GEMCITABINE;  Surgeon: [name] Altes, MD;  Location: [facility] ORS;  Service: Urology;  Laterality: N/A;   VARICOSE VEIN SURGERY     VIDEO ASSISTED THORACOSCOPY Right 06/27/2014   Procedure: VIDEO ASSISTED THORACOSCOPY;  Surgeon: [name] Ovens, MD;  Location: [facility] OR;  Service: Thoracic;  Laterality: Right;   VIDEO BRONCHOSCOPY N/A 06/27/2014   Procedure: VIDEO BRONCHOSCOPY;  Surgeon: [name] Ovens, MD;  Location: [facility] OR;  Service: Thoracic;  Laterality: N/A;     No Known Allergies    Family History  Problem Relation Age of Onset   Tuberculosis Father    Hypertension Father    COPD Mother    Hypertension Mother    Colon cancer Neg Hx      Social History Mr. Westly reports that he quit smoking about 8 years ago. His smoking use included pipe and cigarettes. He started smoking about 18 years ago. He has a 10 pack-year smoking history. He has been exposed to tobacco smoke. He has never used smokeless tobacco. Mr. Boitano reports that he does not currently use alcohol.   Review of Systems CONSTITUTIONAL: No weight loss, fever, chills, weakness or fatigue.  HEENT: Eyes: No visual loss, blurred vision, double vision or yellow sclerae.No hearing  loss, sneezing, congestion, runny nose or sore throat.  SKIN: No rash or itching.  CARDIOVASCULAR: per hpi RESPIRATORY: No shortness of breath, cough or sputum.  GASTROINTESTINAL: No anorexia, nausea, vomiting or diarrhea. No abdominal pain or blood.  GENITOURINARY: No burning on urination, no polyuria NEUROLOGICAL: No headache, dizziness, syncope, paralysis, ataxia, numbness or tingling in the extremities. No change in bowel or bladder control.  MUSCULOSKELETAL: No muscle, back pain, joint pain or stiffness.  LYMPHATICS: No enlarged nodes. No history of splenectomy.  PSYCHIATRIC: No history of depression or anxiety.  ENDOCRINOLOGIC: No reports of sweating, cold or heat intolerance. No polyuria or polydipsia.  Marland Kitchen   Physical Examination Today's Vitals   12/06/22 0917  BP: (!) 116/58  Pulse: (!) 53  SpO2: 98%  Weight: 186 lb 3.2 oz (84.5 kg)  Height: 6' (1.829 m)   Body mass index is 25.25 kg/m.  Gen: resting comfortably, no acute distress HEENT: no scleral icterus, pupils equal round and reactive, no palptable cervical adenopathy,  CV: RRR, no mrg, no jvd Resp: Clear to auscultation bilaterally GI: abdomen is soft, non-tender, non-distended, normal bowel sounds, no hepatosplenomegaly MSK: extremities are warm, no edema.  Skin: warm, no rash Neuro:  no focal deficits Psych: appropriate affect   Diagnostic Studies  09/2017 cath Prox RCA lesion is 30% stenosed. Prox LAD lesion is 100% stenosed. Prox Cx lesion is 20% stenosed. A drug-eluting stent was successfully placed using a STENT SYNERGY DES 3X16. Post intervention, there is a 0% residual stenosis. Ost 1st Diag lesion is 100% stenosed. Balloon angioplasty was performed using a BALLOON SAPPHIRE 2.5X12. Post intervention, there is a 40% residual stenosis. Prox LAD to Mid LAD lesion is 40% stenosed.   1. Acute anterior STEMI secondary to occlusion of the mid LAD 2. Successful PTCA/DES x 1 mid LAD 3. Successful  PTCA/angioplasty ostium of the Diagonal Cipriana Biller.  4. Mild non-obstructive disease in the RCA, circumflex and mid LAD   Post cath Recommendations:  Will admit to the ICU and continue Aggrastat for 6 hours. Will start high intensity statin  and a beta blocker. Echo later today.   . Recommend uninterrupted dual antiplatelet therapy with Aspirin 81mg  daily and Ticagrelor 90mg  twice daily for a minimum of 12 months (ACS - Class I recommendation).      09/2017 echo Study Conclusions   - Left ventricle: The cavity size was normal. There was mild focal   basal hypertrophy of the septum. Systolic function was moderately   to severely reduced. The estimated ejection fraction was in the   range of 30% to 35%. Severe hypokinesis of the mid to apical   anteroseptal, anterior, and anterolateral myocardium. Dyskinetic   at apex. Doppler parameters are consistent with abnormal left   ventricular relaxation (grade 1 diastolic dysfunction). - Aortic valve: Trileaflet; mildly thickened, mildly calcified   leaflets. - Mitral valve: Calcified annulus. There was trivial regurgitation. - Pulmonary arteries: PA peak pressure: 41 mm Hg (S). - Pericardium, extracardiac: A small pericardial effusion was   identified.   Impressions:   - New reduction in LV EF with regional wall motion abnormalities in   the mid to apical anteroseptum, anterior, and anterolateral walls   with dyskinesis at the apex. Small pericardial effusion noted.   10/2017 echo Study Conclusions   - Left ventricle: The cavity size was normal. Wall thickness was   increased in a pattern of moderate LVH. Systolic function was   moderately reduced. The estimated ejection fraction was in the   range of 35% to 40%. Doppler parameters are consistent with   abnormal left ventricular relaxation (grade 1 diastolic   dysfunction). Doppler parameters are consistent with high   ventricular filling pressure. - Regional wall motion abnormality:  Hypokinesis of the apical   septal, apical lateral, and apical myocardium. - Aortic valve: Mildly calcified annulus. Trileaflet; mildly   thickened leaflets. Valve area (VTI): 4.91 cm^2. Valve area   (Vmax): 4.27 cm^2. Valve area (Vmean): 3.54 cm^2. - Mitral valve: Mildly calcified annulus. Mildly thickened leaflets   . - Left atrium: The atrium was moderately dilated. - Pericardium, extracardiac: There is a small circumferential   pericardial effusion. The effusion measures 0.8 cm adjacent to   the LV. There is no evidence of tamponade physiology. - Technically adequate study.     02/2018 heart monitor 21 day event monitor Min HR 48, Max HR 130, Avg HR 71. Min HR occurred in early AM hours presumably while sleeping No symptoms reported 4 beat run of NSVT that was asymptomatic     02/2019 echo   IMPRESSIONS      1. Left ventricular ejection fraction, by visual estimation, is 40 to 45%. The left ventricle has mildly decreased function. There is moderately increased left ventricular hypertrophy.  2. Elevated left atrial pressure.  3. Left ventricular diastolic parameters are consistent with Grade I diastolic dysfunction (impaired relaxation).  4. Hypokinesis of the apex, mid to distal anteroseptal, apical lateral, and apical myocardium.  5. Global right ventricle has normal systolic function.The right ventricular size is normal. No increase in right ventricular wall thickness.  6. Left atrial size was moderately dilated.  7. Right atrial size was normal.  8. The mitral valve is normal in structure. Mild mitral valve regurgitation. No evidence of mitral stenosis.  9. The tricuspid valve is normal in structure. Tricuspid valve regurgitation is not demonstrated. 10. The aortic valve is tricuspid. Aortic valve regurgitation is not visualized. No evidence of aortic valve sclerosis or stenosis. 11. The pulmonic valve was not well visualized. Pulmonic valve regurgitation is not  visualized. 12. The inferior vena cava is normal in size with greater than 50% respiratory variability, suggesting right atrial pressure of 3 mmHg.   10/2020 echo 1. Left ventricular ejection fraction, by estimation, is 40 to 45%. The  left ventricle has mildly decreased function. The left ventricle  demonstrates regional wall motion abnormalities (see scoring  diagram/findings for description). There is mild left  ventricular hypertrophy. Left ventricular diastolic parameters are  consistent with Grade I diastolic dysfunction (impaired relaxation).   2. Right ventricular systolic function is normal. The right ventricular  size is normal. Tricuspid regurgitation signal is inadequate for assessing  PA pressure.   3. Left atrial size was mildly dilated.   4. The mitral valve is degenerative. Mild mitral valve regurgitation. The  mean mitral valve gradient is 1.0 mmHg.   5. The aortic valve is tricuspid. There is mild calcification of the  aortic valve. Aortic valve regurgitation is not visualized. Mild aortic  valve sclerosis is present, with no evidence of aortic valve stenosis.  Aortic valve mean gradient measures 3.0  mmHg.   6. The inferior vena cava is normal in size with greater than 50%  respiratory variability, suggesting right atrial pressure of 3 mmHg.      Assessment and Plan   1. CAD/ICM/chronic systolic HF -  off entresto due to cough, valsartan dose lowered due to uptrending Cr that has resolved. Off aldactone due to elevated K.  - denies any symptoms, continue current meds     2. Hyperlipidemia - LDL is at goal, continue current meds   3. Palpitations - monitor with benign ectopy, short infrequent SVT - afib in setting of STEMI without recurrence, anticoag previously stopped - no recent symptoms, continue beta blocker. He has weaned caffeine.     He has moved to [place], we will set him up in our clinic there with [name] Lily Kocher,  M.D.

## 2022-12-06 NOTE — Patient Instructions (Signed)
Medication Instructions:  Continue all current medications.  Labwork: none  Testing/Procedures: none  Follow-Up: 6 months   Any Other Special Instructions Will Be Listed Below (If Applicable).  If you need a refill on your cardiac medications before your next appointment, please call your pharmacy.  

## 2022-12-07 ENCOUNTER — Ambulatory Visit: Payer: Medicare Other | Admitting: Student

## 2022-12-07 ENCOUNTER — Encounter: Payer: Self-pay | Admitting: Student

## 2022-12-07 VITALS — BP 122/66 | HR 66 | Temp 98.1°F | Ht 72.0 in | Wt 186.0 lb

## 2022-12-07 DIAGNOSIS — N1831 Chronic kidney disease, stage 3a: Secondary | ICD-10-CM | POA: Diagnosis not present

## 2022-12-07 DIAGNOSIS — G4700 Insomnia, unspecified: Secondary | ICD-10-CM | POA: Diagnosis not present

## 2022-12-07 DIAGNOSIS — I10 Essential (primary) hypertension: Secondary | ICD-10-CM

## 2022-12-07 DIAGNOSIS — R7303 Prediabetes: Secondary | ICD-10-CM

## 2022-12-07 DIAGNOSIS — E785 Hyperlipidemia, unspecified: Secondary | ICD-10-CM

## 2022-12-07 MED ORDER — TRAZODONE HCL 50 MG PO TABS
50.0000 mg | ORAL_TABLET | Freq: Every evening | ORAL | 0 refills | Status: DC | PRN
Start: 2022-12-07 — End: 2023-02-07

## 2022-12-07 NOTE — Progress Notes (Signed)
Location:  [facility] [place] [facility]   Provider: Dr. Earnestine Mealing  Code Status: DNR Goals of Care:     12/07/2022    2:18 PM  Advanced Directives  Does Patient Have a Medical Advance Directive? Yes  Type of Estate agent of [place];Out of facility DNR (pink MOST or yellow form);Living will  Does patient want to make changes to medical advance directive? No - Patient declined  Copy of Healthcare Power of Attorney in Chart? Yes - validated most recent copy scanned in chart (See row information)     Chief Complaint  Patient presents with   Medical Management of Chronic Issues    Medical Management of Chronic Issues. 2 Month Follow up    HPI: Patient is a 77 y.o. male seen today for medical management of chronic diseases.    He had cardiology appointment - no changes. F/u 6 months.   Appt with pulmonology - Moved to October 28 for the appointment and the CT scan 9/27  His anxiety is much better. His sleeping pills have been discontinued. He has only taken it 1x in the last 2 weeks. He has had three incidents in the last 4-5 years for anxiety. He started sertraline again in May of this year, and it was shortly after that he restarted on the medication at that time.   They have moved and they are just unpacking at this time.   Past Medical History:  Diagnosis Date   Acute systolic heart failure (HCC)    Anxiety    COPD (chronic obstructive pulmonary disease) (HCC)    Enlarged prostate with lower urinary tract symptoms (LUTS)    GERD (gastroesophageal reflux disease)    Hyperlipidemia    Hypertension    Low serum testosterone level    PAF (paroxysmal atrial fibrillation) (HCC)    Pneumonia 05/2014   Pyothorax without fistula (HCC)    Sciatica    Shortness of breath dyspnea    Solitary pulmonary nodule    STEMI (ST elevation myocardial infarction) (HCC)    09/27/17 PCI/DES x1 mLAD, PTCA of the diag branch. EF 30% Lifevest at discharge.    Testicular  hypofunction    Urinary frequency     Past Surgical History:  Procedure Laterality Date   APPENDECTOMY     BACK SURGERY     COLONOSCOPY  2011   Tubular adenoma per patient   COLONOSCOPY N/A 03/03/2015   Dr. Darrick Penna: Left colon is redundant, mild diverticulosis, small internal hemorrhoids, moderate external hemorrhoids.  Next colonoscopy in 5 years with overtube.   COLONOSCOPY N/A 03/25/2019   Procedure: COLONOSCOPY;  Surgeon: West Bali, MD;  [facility];  Service: Endoscopy;  Laterality: N/A;  8:30am - Camille's request   CORONARY ULTRASOUND/IVUS N/A 09/27/2017   Procedure: Intravascular Ultrasound/IVUS;  Surgeon: Kathleene Hazel, MD;  [facility]: [facility];  Service: Cardiovascular;  Laterality: N/A;   CORONARY/GRAFT ACUTE MI REVASCULARIZATION N/A 09/27/2017   Procedure: Coronary/Graft Acute MI Revascularization;  Surgeon: Kathleene Hazel, MD;  [facility]: [facility];  Service: Cardiovascular;  Laterality: N/A;   CYSTOSCOPY W/ RETROGRADES Bilateral 06/16/2020   Procedure: CYSTOSCOPY WITH RETROGRADE PYELOGRAM;  Surgeon: Riki Altes, MD;  Location: [facility] OR[facility];  Service: Urology;  Laterality: Bilateral;   EMPYEMA DRAINAGE Right 06/27/2014   Procedure: EMPYEMA DRAINAGE;  Surgeon: Delight Ovens, MD;  Location: [facility] OR;  Service: Thoracic;  Laterality: Right;   HEMORRHOID BANDING N/A 03/03/2015   Procedure:  HEMORRHOID BANDING;  Surgeon: West Bali, MD;  [facility];  Service: Endoscopy;  Laterality: N/A;  Recomended to see a Careers adviser.   HERNIA REPAIR     LEFT HEART CATH AND CORONARY ANGIOGRAPHY N/A 09/27/2017   Procedure: LEFT HEART CATH AND CORONARY ANGIOGRAPHY;  Surgeon: Kathleene Hazel, MD;  [facility];  Service: Cardiovascular;  Laterality: N/A;   LUNG SURGERY  2016   POLYPECTOMY  03/25/2019   Procedure: POLYPECTOMY;  Surgeon: West Bali, MD;  [facility];  Service: Endoscopy;;   TEE  WITHOUT CARDIOVERSION N/A 07/14/2014   Procedure: TRANSESOPHAGEAL ECHOCARDIOGRAM (TEE);  Surgeon: Quintella Reichert, MD;  Location: [facility] ENDOSCOPY;  Service: Cardiovascular;  Laterality: N/A;   TRANSURETHRAL RESECTION OF BLADDER TUMOR N/A 06/16/2020   Procedure: TRANSURETHRAL RESECTION OF BLADDER TUMOR (TURBT) WITH GEMCITABINE;  Surgeon: Riki Altes, MD;  Location: [facility] ORS;  Service: Urology;  Laterality: N/A;   VARICOSE VEIN SURGERY     VIDEO ASSISTED THORACOSCOPY Right 06/27/2014   Procedure: VIDEO ASSISTED THORACOSCOPY;  Surgeon: Delight Ovens, MD;  Location: [facility] OR;  Service: Thoracic;  Laterality: Right;   VIDEO BRONCHOSCOPY N/A 06/27/2014   Procedure: VIDEO BRONCHOSCOPY;  Surgeon: Delight Ovens, MD;  Location[facility] OR;  Service: Thoracic;  Laterality: N/A;    No Known Allergies  Outpatient Encounter Medications as of 12/07/2022  Medication Sig   acetaminophen (TYLENOL) 500 MG tablet Take 500 mg by mouth every 6 (six) hours as needed for moderate pain.   ALPRAZolam (XANAX) 0.25 MG tablet Take 0.25 mg by mouth at bedtime as needed for anxiety.   aspirin EC 81 MG tablet Take 1 tablet (81 mg total) by mouth daily.   Calcium Citrate-Vitamin D (CALCIUM CITRATE + D3 PO) Take 1 tablet by mouth daily.   Carboxymethylcellul-Glycerin (LUBRICATING EYE DROPS OP) Place 1 drop into both eyes 2 (two) times daily. As needed   carvedilol (COREG) 6.25 MG tablet TAKE 1 TABLET(6.25 MG) BY MOUTH TWICE DAILY WITH A MEAL   cyclobenzaprine (FLEXERIL) 5 MG tablet Take 5 mg by mouth at bedtime. As needed for lower back pain.   Dextromethorphan-guaiFENesin (MUCUS RELIEF DM PO) Take by mouth. As needed.   famotidine (PEPCID) 20 MG tablet One after supper   FARXIGA 10 MG TABS tablet TAKE 1 TABLET(10 MG) BY MOUTH DAILY   Menthol, Topical Analgesic, (BIOFREEZE EX) Apply topically.   Multiple Vitamins-Minerals (SENIOR VITES PO) Take 1 tablet by mouth daily.   nitroGLYCERIN (NITROSTAT) 0.4 MG SL tablet Place 1 tablet  (0.4 mg total) under the tongue every 5 (five) minutes as needed.   pantoprazole (PROTONIX) 40 MG tablet Take 1 tablet (40 mg total) by mouth daily. Take 30-60 min before first meal of the day   rosuvastatin (CRESTOR) 40 MG tablet TAKE 1 TABLET(40 MG) BY MOUTH DAILY   sertraline (ZOLOFT) 25 MG tablet Take 25 mg by mouth daily.   sertraline (ZOLOFT) 50 MG tablet Take 75 mg by mouth daily.   tamsulosin (FLOMAX) 0.4 MG CAPS capsule Take 1 capsule (0.4 mg total) by mouth daily.   valsartan (DIOVAN) 80 MG tablet Take 80 mg by mouth daily.   No facility-administered encounter medications on file as of 12/07/2022.    Review of Systems:  Review of Systems  Health Maintenance  Topic Date Due   COVID-19 Vaccine (10 - 2023-24 season) 12/05/2022   Medicare Annual Wellness (AWV)  03/10/2023   DTaP/Tdap/Td (2 - Td or Tdap)  08/09/2023   Pneumonia Vaccine 75+ Years old  Completed   INFLUENZA VACCINE  Completed   Hepatitis C Screening  Completed   Zoster Vaccines- Shingrix  Completed   HPV VACCINES  Aged Out   Colonoscopy  Discontinued    Physical Exam: Vitals:   12/07/22 1413  BP: 122/66  Pulse: 66  Temp: 98.1 F (36.7 C)  SpO2: 94%  Weight: 186 lb (84.4 kg)  Height: 6' (1.829 m)   Body mass index is 25.23 kg/m. Physical Exam Constitutional:      Appearance: Normal appearance.  Cardiovascular:     Rate and Rhythm: Normal rate and regular rhythm.     Pulses: Normal pulses.  Pulmonary:     Effort: Pulmonary effort is normal.     Breath sounds: Normal breath sounds.  Neurological:     Mental Status: He is alert and oriented to person, place, and time.     Labs reviewed: Basic Metabolic Panel: Recent Labs    01/10/22 1049 01/21/22 0945 02/16/22 1111 08/12/22 0000  NA 140 142 143  --   K 5.5* 5.3* 5.2  --   CL 101 102 104  --   CO2 23 29 24   --   GLUCOSE 92 76 88  --   BUN 28* 28* 24 19  CREATININE 1.41* 1.39* 1.57* 1.3  CALCIUM 9.4 9.6 9.5  --    Liver Function  Tests: No results for input(s): "AST", "ALT", "ALKPHOS", "BILITOT", "PROT", "ALBUMIN" in the last 8760 hours. No results for input(s): "LIPASE", "AMYLASE" in the last 8760 hours. No results for input(s): "AMMONIA" in the last 8760 hours. CBC: Recent Labs    01/10/22 1049 08/12/22 0000  WBC 6.5 11.5  NEUTROABS 4.2  --   HGB 15.1 14.6  HCT 45.9 45  MCV 96  --   PLT 239  --    Lipid Panel: Recent Labs    08/12/22 0000  CHOL 95  HDL 47  LDLCALC 15  TRIG 69   Lab Results  Component Value Date   HGBA1C 5.8 08/12/2022    Procedures since last visit: No results found.  Assessment/Plan Insomnia, unspecified type - Plan: traZODone (DESYREL) 50 MG tablet  Stage 3a chronic kidney disease (HCC) - Plan: VITAMIN D 25 Hydroxy (Vit-D Deficiency, Fractures), CBC With Differential/Platelet, Magnesium  Primary hypertension - Plan: Complete Metabolic Panel with eGFR  Hyperlipidemia, unspecified hyperlipidemia type - Plan: Lipid Panel  Pre-diabetes - Plan: Hemoglobin A1c Patient was able to discontinue xanax without issue. Sleeping well at this time. Would like an alternative, will trial trazodone. Labs before f/u appt. BP well-controlled. Anxiety improved, will plan for dose reduction of medication in November and discuss labs at that time.   Labs/tests ordered:  * No order type specified * Next appt:  Visit date not found  I spent greater than 30 minutes for the care of this patient in face to face time, chart review, clinical documentation, patient education.

## 2022-12-07 NOTE — Patient Instructions (Addendum)
Self assessment in November regarding mood- if feeling well at that time, can work on dose reduction as follows:  - decrease to 50 mg daily for 2 weeks, then take 1/2 of 50 mg tablet for 2 weeks, then stop the medication.   Please come to the clinic at Thursday, November 13 7:30 am to have your blood collected. We will discuss your labs at your follow up appointment on Monday November 18.

## 2022-12-12 ENCOUNTER — Telehealth: Payer: Self-pay | Admitting: Cardiology

## 2022-12-12 NOTE — Telephone Encounter (Signed)
New Message:       Patient would like to switch from Dr Dina Rich to Dr Azucena Cecil. This works out better for the patient, he lives in [place]. Is this alright with you?

## 2022-12-12 NOTE — Telephone Encounter (Signed)
RE: PATIENT IS REQUESTING TO TRANSFER TO THE [place] OFFICE Received: 3 days ago Debbe Odea, MD  Odette Horns, Dorothe Pea, MD [facility] with me       Previous Messages    ----- Message ----- From: Zachary George T Sent: 12/06/2022  10:04 AM EDT To: Antoine Poche, MD; Debbe Odea, MD Subject: PATIENT IS REQUESTING TO TRANSFER TO THE [facility]*  Patient is wanting to transfer to the [place] office due to living in [place]. Would like to be referred to Agbor-Etang MD   Thank you,

## 2022-12-13 DIAGNOSIS — Z23 Encounter for immunization: Secondary | ICD-10-CM | POA: Diagnosis not present

## 2022-12-16 ENCOUNTER — Ambulatory Visit
Admission: RE | Admit: 2022-12-16 | Discharge: 2022-12-16 | Disposition: A | Discharge status: Home / Self Care | Payer: Medicare Other | Source: Ambulatory Visit | Attending: Internal Medicine | Admitting: Internal Medicine

## 2022-12-16 ENCOUNTER — Ambulatory Visit: Payer: Medicare Other | Admitting: Student in an Organized Health Care Education/Training Program

## 2022-12-16 DIAGNOSIS — J432 Centrilobular emphysema: Secondary | ICD-10-CM | POA: Diagnosis not present

## 2022-12-16 DIAGNOSIS — R918 Other nonspecific abnormal finding of lung field: Secondary | ICD-10-CM | POA: Diagnosis not present

## 2022-12-16 DIAGNOSIS — J479 Bronchiectasis, uncomplicated: Secondary | ICD-10-CM | POA: Diagnosis not present

## 2022-12-16 DIAGNOSIS — R911 Solitary pulmonary nodule: Secondary | ICD-10-CM | POA: Insufficient documentation

## 2022-12-26 ENCOUNTER — Encounter: Payer: Self-pay | Admitting: Internal Medicine

## 2022-12-27 NOTE — Telephone Encounter (Signed)
Still pending - once it's available I'll post it and call him wit results/ will be avaiable to new docs bu there's about a 2 week lag time right now due to lack of radiologists trainded in reading these

## 2022-12-27 NOTE — Telephone Encounter (Signed)
Are you able to see patient report ?

## 2023-01-03 DIAGNOSIS — D225 Melanocytic nevi of trunk: Secondary | ICD-10-CM | POA: Diagnosis not present

## 2023-01-03 DIAGNOSIS — Z1283 Encounter for screening for malignant neoplasm of skin: Secondary | ICD-10-CM | POA: Diagnosis not present

## 2023-01-03 DIAGNOSIS — T148XXA Other injury of unspecified body region, initial encounter: Secondary | ICD-10-CM | POA: Diagnosis not present

## 2023-01-13 ENCOUNTER — Encounter: Payer: Self-pay | Admitting: Cardiology

## 2023-01-13 ENCOUNTER — Encounter: Payer: Self-pay | Admitting: Student

## 2023-01-13 ENCOUNTER — Other Ambulatory Visit: Payer: Self-pay | Admitting: Cardiology

## 2023-01-13 MED ORDER — CARVEDILOL 6.25 MG PO TABS: 6.2500 mg | ORAL_TABLET | Freq: Two times a day (BID) | ORAL | 1 refills | Status: DC

## 2023-01-13 MED ORDER — ROSUVASTATIN CALCIUM 40 MG PO TABS: 40.0000 mg | ORAL_TABLET | Freq: Every day | ORAL | 1 refills | Status: DC

## 2023-01-16 ENCOUNTER — Encounter: Payer: Self-pay | Admitting: Student in an Organized Health Care Education/Training Program

## 2023-01-16 ENCOUNTER — Ambulatory Visit (INDEPENDENT_AMBULATORY_CARE_PROVIDER_SITE_OTHER): Payer: Medicare Other | Admitting: Student in an Organized Health Care Education/Training Program

## 2023-01-16 VITALS — BP 116/62 | HR 68 | Temp 97.6°F | Ht 72.0 in | Wt 185.4 lb

## 2023-01-16 DIAGNOSIS — J479 Bronchiectasis, uncomplicated: Secondary | ICD-10-CM

## 2023-01-16 DIAGNOSIS — R053 Chronic cough: Secondary | ICD-10-CM

## 2023-01-16 DIAGNOSIS — J432 Centrilobular emphysema: Secondary | ICD-10-CM | POA: Diagnosis not present

## 2023-01-16 MED ORDER — TAMSULOSIN HCL 0.4 MG PO CAPS: 0.4000 mg | ORAL_CAPSULE | Freq: Every day | ORAL | 1 refills | Status: DC

## 2023-01-16 MED ORDER — ALBUTEROL SULFATE (2.5 MG/3ML) 0.083% IN NEBU
2.5000 mg | INHALATION_SOLUTION | Freq: Two times a day (BID) | RESPIRATORY_TRACT | 11 refills | Status: DC
Start: 2023-01-16 — End: 2023-02-08

## 2023-01-16 MED ORDER — SODIUM CHLORIDE 3 % IN NEBU
INHALATION_SOLUTION | Freq: Two times a day (BID) | RESPIRATORY_TRACT | 11 refills | Status: DC
Start: 2023-01-16 — End: 2023-02-08

## 2023-01-16 MED ORDER — SERTRALINE HCL 25 MG PO TABS: 25.0000 mg | ORAL_TABLET | Freq: Every day | ORAL | 1 refills | Status: DC

## 2023-01-16 NOTE — Progress Notes (Signed)
Synopsis: Referred in for bronchiectasis by Earnestine Mealing, MD  Assessment & Plan:   1. Bronchiectasis without complication (HCC) 2. Chronic Cough 3. Emphysema  He is presenting for the evaluation of bronchiectasis which was in direct result to his severe RLL pneumonia and empyema in 2016. His cough is currently non-productive but his imaging studies show airway thickening and retained mucus in the RLL with associated tree-in-bud opacities on my review. The infiltrates noted are infectious in nature. He's had respiratory cultures in July of 2024 that grew candida, and I would like to repeat them to ensure he is not colonized with pseudomonas or MAC.  Today, we also discussed pulmonary clearance regimens which we will initiate. He will use hypertonic saline, albuterol, a flutter device, and an incentive spirometer. His wife will also assist with manual percussion to help mobilize secretions. Should there not be an improvement in the amount of mucus on follow up imaging, I will consider flexible bronchoscopy with BAL to send for culture. I will also consider repeating his PFT's (previously showing obstruction) on follow up.   - sodium chloride HYPERTONIC 3 % nebulizer solution; Take by nebulization in the morning and at bedtime.  Dispense: 750 mL; Refill: 11 - AMB REFERRAL FOR DME - albuterol (PROVENTIL) (2.5 MG/3ML) 0.083% nebulizer solution; Take 3 mLs (2.5 mg total) by nebulization in the morning and at bedtime.  Dispense: 180 mL; Refill: 11 - Sputum induction; Standing - Gram Stain w/Sputum Cult Rflx; Standing - AFB culture with smear; Standing - Fungus Culture with Smear; Standing   Return in about 3 months (around 04/18/2023).  I spent 51 minutes caring for this patient today, including preparing to see the patient, obtaining a medical history , reviewing a separately obtained history, performing a medically appropriate examination and/or evaluation, counseling and educating the  patient/family/caregiver, ordering medications, tests, or procedures, documenting clinical information in the electronic health record, and independently interpreting results (not separately reported/billed) and communicating results to the patient/family/caregiver  Raechel Chute, MD [facility] 01/16/2023 11:16 AM    End of visit medications:  Meds ordered this encounter  Medications   sodium chloride HYPERTONIC 3 % nebulizer solution    Sig: Take by nebulization in the morning and at bedtime.    Dispense:  750 mL    Refill:  11   albuterol (PROVENTIL) (2.5 MG/3ML) 0.083% nebulizer solution    Sig: Take 3 mLs (2.5 mg total) by nebulization in the morning and at bedtime.    Dispense:  180 mL    Refill:  11     Current Outpatient Medications:    acetaminophen (TYLENOL) 500 MG tablet, Take 500 mg by mouth every 6 (six) hours as needed for moderate pain., Disp: , Rfl:    albuterol (PROVENTIL) (2.5 MG/3ML) 0.083% nebulizer solution, Take 3 mLs (2.5 mg total) by nebulization in the morning and at bedtime., Disp: 180 mL, Rfl: 11   aspirin EC 81 MG tablet, Take 1 tablet (81 mg total) by mouth daily., Disp: 90 tablet, Rfl: 3   Calcium Citrate-Vitamin D (CALCIUM CITRATE + D3 PO), Take 1 tablet by mouth daily., Disp: , Rfl:    Carboxymethylcellul-Glycerin (LUBRICATING EYE DROPS OP), Place 1 drop into both eyes 2 (two) times daily. As needed, Disp: , Rfl:    cyclobenzaprine (FLEXERIL) 5 MG tablet, Take 5 mg by mouth at bedtime. As needed for lower back pain., Disp: , Rfl:    Dextromethorphan-guaiFENesin (MUCUS RELIEF DM PO), Take by mouth. As  needed., Disp: , Rfl:    famotidine (PEPCID) 20 MG tablet, One after supper, Disp: 30 tablet, Rfl: 11   FARXIGA 10 MG TABS tablet, TAKE 1 TABLET(10 MG) BY MOUTH DAILY, Disp: 90 tablet, Rfl: 3   Menthol, Topical Analgesic, (BIOFREEZE EX), Apply topically., Disp: , Rfl:    Multiple Vitamins-Minerals (SENIOR VITES PO), Take 1 tablet by  mouth daily., Disp: , Rfl:    nitroGLYCERIN (NITROSTAT) 0.4 MG SL tablet, Place 1 tablet (0.4 mg total) under the tongue every 5 (five) minutes as needed., Disp: 25 tablet, Rfl: 3   pantoprazole (PROTONIX) 40 MG tablet, Take 1 tablet (40 mg total) by mouth daily. Take 30-60 min before first meal of the day, Disp: 30 tablet, Rfl: 2   rosuvastatin (CRESTOR) 40 MG tablet, Take 1 tablet (40 mg total) by mouth daily., Disp: 90 tablet, Rfl: 1   sertraline (ZOLOFT) 25 MG tablet, Take 1 tablet (25 mg total) by mouth daily., Disp: 90 tablet, Rfl: 1   sertraline (ZOLOFT) 50 MG tablet, Take 75 mg by mouth daily., Disp: , Rfl:    sodium chloride HYPERTONIC 3 % nebulizer solution, Take by nebulization in the morning and at bedtime., Disp: 750 mL, Rfl: 11   tamsulosin (FLOMAX) 0.4 MG CAPS capsule, Take 1 capsule (0.4 mg total) by mouth daily., Disp: 90 capsule, Rfl: 1   traZODone (DESYREL) 50 MG tablet, Take 1 tablet (50 mg total) by mouth at bedtime as needed for sleep., Disp: 30 tablet, Rfl: 0   valsartan (DIOVAN) 80 MG tablet, Take 80 mg by mouth daily., Disp: , Rfl:    carvedilol (COREG) 6.25 MG tablet, Take 1 tablet (6.25 mg total) by mouth 2 (two) times daily with a meal. (Patient not taking: Reported on 01/16/2023), Disp: 180 tablet, Rfl: 1   Subjective:   PATIENT ID: [name] [name] GENDER: male DOB: 01-13-1946, MRN: [id number]  Chief Complaint  Patient presents with   Follow-up    Chronic cough. No shortness of breath or wheezing.     HPI  Patient is a pleasant 77 year old male presenting to clinic to establish care. He was previously followed by Dr. Sherene Sires at our [place] location but has since moved to [facility] and would like to transition to our location.  He feels well overall, but continues to report a cough. The cough used to be productive but has been non-productive recently. He's had multiple CT scans of the chest showing RLL bronchiectasis for which he's followed with Dr. Sherene Sires. He is  not currently on any inhalers, and doesn't report any improvement with inhalers when he was using them. He does have a flutter device that he uses infrequently. No fevers or chills reported. Reports exacerbations once or twice a year with fever and increased sputum production that improves with course of azithromycin.  Patient reports that his respiratory symptoms all started in 2016 following a pulmonary infection that was complicated by empyema. Review of the medical record is notable for an admission in April of 2016 where he was seen for empyema and underwent flexible bronchoscopy in addition to VATS decortication of the visceral pleura. CT abdomen/pelvis March 2016 showed a RLL pneuomonia, while CT chest from April 7 of 2016 showed a loculated pleural effusion consistent with empyema. The following CT from April 20 of 2016 showed persistent collapse/atelectasis in the RLL. June 2016 imaging showed re-expansion of the RLL with post infectious fibrosis and scarring. A CT chest from August of 2017 showed very mild bronchiolectasis  with post surgical changes. Bronchiectasis begins to be evident in imaging from march of 2019, with associated tree-in-bud opacities in addition to the ectatic and dilated bronchi.  Patient previously worked as an Programmer, systems, Holiday representative in college. He is a former smoker, with around 10 pack years of smoking history, in addition to smoking pipe for over 40 years. He is currently retired.  Ancillary information including prior medications, full medical/surgical/family/social histories, and PFTs (when available) are listed below and have been reviewed.   Review of Systems  Constitutional:  Negative for chills, fever and weight loss.  Respiratory:  Positive for cough. Negative for hemoptysis, sputum production, shortness of breath and wheezing.   Cardiovascular:  Negative for chest pain.     Objective:   Vitals:   01/16/23 1016  BP: 116/62  Pulse: 68   Temp: 97.6 F (36.4 C)  TempSrc: Temporal  SpO2: 98%  Weight: 185 lb 6.4 oz (84.1 kg)  Height: 6' (1.829 m)   98% on RA  BMI Readings from Last 3 Encounters:  01/16/23 25.14 kg/m  12/07/22 25.23 kg/m  12/06/22 25.25 kg/m   Wt Readings from Last 3 Encounters:  01/16/23 185 lb 6.4 oz (84.1 kg)  12/07/22 186 lb (84.4 kg)  12/06/22 186 lb 3.2 oz (84.5 kg)    Physical Exam Constitutional:      Appearance: Normal appearance.  Cardiovascular:     Rate and Rhythm: Normal rate and regular rhythm.     Pulses: Normal pulses.     Heart sounds: Normal heart sounds.  Pulmonary:     Effort: Pulmonary effort is normal.     Breath sounds: Normal breath sounds. No wheezing, rhonchi or rales.  Musculoskeletal:     Right lower leg: No edema.     Left lower leg: No edema.  Neurological:     General: No focal deficit present.     Mental Status: He is alert and oriented to person, place, and time. Mental status is at baseline.       Ancillary Information    Past Medical History:  Diagnosis Date   Acute systolic heart failure (HCC)    Anxiety    COPD (chronic obstructive pulmonary disease) (HCC)    Enlarged prostate with lower urinary tract symptoms (LUTS)    GERD (gastroesophageal reflux disease)    Hyperlipidemia    Hypertension    Low serum testosterone level    PAF (paroxysmal atrial fibrillation) (HCC)    Pneumonia 05/2014   Pyothorax without fistula (HCC)    Sciatica    Shortness of breath dyspnea    Solitary pulmonary nodule    STEMI (ST elevation myocardial infarction) (HCC)    09/27/17 PCI/DES x1 mLAD, PTCA of the diag branch. EF 30% Lifevest at discharge.    Testicular hypofunction    Urinary frequency      Family History  Problem Relation Age of Onset   Tuberculosis Father    Hypertension Father    COPD Mother    Hypertension Mother    Colon cancer Neg Hx      Past Surgical History:  Procedure Laterality Date   APPENDECTOMY     BACK SURGERY      COLONOSCOPY  2011   Tubular adenoma per patient   COLONOSCOPY N/A 03/03/2015   Dr. Darrick Penna: Left colon is redundant, mild diverticulosis, small internal hemorrhoids, moderate external hemorrhoids.  Next colonoscopy in 5 years with overtube.   COLONOSCOPY N/A 03/25/2019   Procedure: COLONOSCOPY;  Surgeon: Darrick Penna,  Darleene Cleaver, MD;  Location: [facility];  Service: Endoscopy;  Laterality: N/A;  8:30am - Camille's request   CORONARY ULTRASOUND/IVUS N/A 09/27/2017   Procedure: Intravascular Ultrasound/IVUS;  Surgeon: Kathleene Hazel, MD;  Location: [facility];  Service: Cardiovascular;  Laterality: N/A;   CORONARY/GRAFT ACUTE MI REVASCULARIZATION N/A 09/27/2017   Procedure: Coronary/Graft Acute MI Revascularization;  Surgeon: Kathleene Hazel, MD;  Location: [facility];  Service: Cardiovascular;  Laterality: N/A;   CYSTOSCOPY W/ RETROGRADES Bilateral 06/16/2020   Procedure: CYSTOSCOPY WITH RETROGRADE PYELOGRAM;  Surgeon: Riki Altes, MD;  Location: [facility] ORS;  Service: Urology;  Laterality: Bilateral;   EMPYEMA DRAINAGE Right 06/27/2014   Procedure: EMPYEMA DRAINAGE;  Surgeon: Delight Ovens, MD;  [facility] OR;  Service: Thoracic;  Laterality: Right;   HEMORRHOID BANDING N/A 03/03/2015   Procedure: Busby Lions;  Surgeon: West Bali, MD;  Location: [facility];  Service: Endoscopy;  Laterality: N/A;  Recomended to see a Careers adviser.   HERNIA REPAIR     LEFT HEART CATH AND CORONARY ANGIOGRAPHY N/A 09/27/2017   Procedure: LEFT HEART CATH AND CORONARY ANGIOGRAPHY;  Surgeon: Kathleene Hazel, MD;  [facility]: [facility];  Service: Cardiovascular;  Laterality: N/A;   LUNG SURGERY  2016   POLYPECTOMY  03/25/2019   Procedure: POLYPECTOMY;  Surgeon: West Bali, MD;  Location: [facility];  Service: Endoscopy;;   TEE WITHOUT CARDIOVERSION N/A 07/14/2014   Procedure: TRANSESOPHAGEAL ECHOCARDIOGRAM (TEE);  Surgeon: Quintella Reichert, MD;  Location: [facility]  ENDOSCOPY;  Service: Cardiovascular;  Laterality: N/A;   TRANSURETHRAL RESECTION OF BLADDER TUMOR N/A 06/16/2020   Procedure: TRANSURETHRAL RESECTION OF BLADDER TUMOR (TURBT) WITH GEMCITABINE;  Surgeon: Riki Altes, MD;  Location: [facility] ORS;  Service: Urology;  Laterality: N/A;   VARICOSE VEIN SURGERY     VIDEO ASSISTED THORACOSCOPY Right 06/27/2014   Procedure: VIDEO ASSISTED THORACOSCOPY;  Surgeon: Delight Ovens, MD;  Location: [facility] OR;  Service: Thoracic;  Laterality: Right;   VIDEO BRONCHOSCOPY N/A 06/27/2014   Procedure: VIDEO BRONCHOSCOPY;  Surgeon: Delight Ovens, MD;  Location: [facility] OR;  Service: Thoracic;  Laterality: N/A;    Social History   Socioeconomic History   Marital status: Married    Spouse name: Not on file   Number of children: Not on file   Years of education: Not on file   Highest education level: Not on file  Occupational History   Not on file  Tobacco Use   Smoking status: Former    Current packs/day: 0.00    Average packs/day: 1 pack/day for 10.0 years (10.0 ttl pk-yrs)    Types: Pipe, Cigarettes    Start date: 06/06/2004    Quit date: 06/07/2014    Years since quitting: 8.6    Passive exposure: Past   Smokeless tobacco: Never   Tobacco comments:    smoked pipe for 40 years, daily  Vaping Use   Vaping status: Never Used  Substance and Sexual Activity   Alcohol use: Not Currently    Comment: Average 1-2 drinks of beer in the evening; 04/29/20 none since 01/2020   Drug use: No   Sexual activity: Not on file  Other Topics Concern   Not on file  Social History Narrative   Not on file   Social Determinants of Health   Financial Resource Strain: Not on file  Food Insecurity: Unknown (05/24/2022)   [facility]   Hunger Vital  Sign    Worried About Programme researcher, broadcasting/film/video in the Last Year: Patient declined to answer    Ran Out of Food in the Last Year: Patient declined to answer  Transportation Needs: Not on file (05/24/2022)   Physical Activity: Not on file  Stress: Not on file  Social Connections: Not on file  Intimate Partner Violence: Not on file     No Known Allergies   CBC    Component Value Date/Time   WBC 11.5 08/12/2022 0000   WBC 13.0 (H) 12/13/2019 1006   RBC 4.59 08/12/2022 0000   HGB 14.6 08/12/2022 0000   HGB 15.1 01/10/2022 1049   HCT 45 08/12/2022 0000   HCT 45.9 01/10/2022 1049   PLT 239 01/10/2022 1049   MCV 96 01/10/2022 1049   MCH 31.5 01/10/2022 1049   MCH 31.6 12/13/2019 1006   MCHC 32.9 01/10/2022 1049   MCHC 32.2 12/13/2019 1006   RDW 12.2 01/10/2022 1049   LYMPHSABS 1.3 01/10/2022 1049   MONOABS 1.2 (H) 12/13/2019 1006   EOSABS 0.2 01/10/2022 1049   BASOSABS 0.1 01/10/2022 1049    Pulmonary Functions Testing Results:    Latest Ref Rng & Units 08/06/2019    1:47 PM  PFT Results  FVC-Pre L 3.80   FVC-Predicted Pre % 80   FVC-Post L 4.08   FVC-Predicted Post % 86   Pre FEV1/FVC % % 60   Post FEV1/FCV % % 64   FEV1-Pre L 2.26   FEV1-Predicted Pre % 66   FEV1-Post L 2.60   DLCO uncorrected ml/min/mmHg 18.09   DLCO UNC% % 66   DLCO corrected ml/min/mmHg 18.09   DLCO COR %Predicted % 66   DLVA Predicted % 79     Outpatient Medications Prior to Visit  Medication Sig Dispense Refill   acetaminophen (TYLENOL) 500 MG tablet Take 500 mg by mouth every 6 (six) hours as needed for moderate pain.     aspirin EC 81 MG tablet Take 1 tablet (81 mg total) by mouth daily. 90 tablet 3   Calcium Citrate-Vitamin D (CALCIUM CITRATE + D3 PO) Take 1 tablet by mouth daily.     Carboxymethylcellul-Glycerin (LUBRICATING EYE DROPS OP) Place 1 drop into both eyes 2 (two) times daily. As needed     cyclobenzaprine (FLEXERIL) 5 MG tablet Take 5 mg by mouth at bedtime. As needed for lower back pain.     Dextromethorphan-guaiFENesin (MUCUS RELIEF DM PO) Take by mouth. As needed.     famotidine (PEPCID) 20 MG tablet One after supper 30 tablet 11   FARXIGA 10 MG TABS tablet TAKE 1 TABLET(10  MG) BY MOUTH DAILY 90 tablet 3   Menthol, Topical Analgesic, (BIOFREEZE EX) Apply topically.     Multiple Vitamins-Minerals (SENIOR VITES PO) Take 1 tablet by mouth daily.     nitroGLYCERIN (NITROSTAT) 0.4 MG SL tablet Place 1 tablet (0.4 mg total) under the tongue every 5 (five) minutes as needed. 25 tablet 3   pantoprazole (PROTONIX) 40 MG tablet Take 1 tablet (40 mg total) by mouth daily. Take 30-60 min before first meal of the day 30 tablet 2   rosuvastatin (CRESTOR) 40 MG tablet Take 1 tablet (40 mg total) by mouth daily. 90 tablet 1   sertraline (ZOLOFT) 25 MG tablet Take 1 tablet (25 mg total) by mouth daily. 90 tablet 1   sertraline (ZOLOFT) 50 MG tablet Take 75 mg by mouth daily.     tamsulosin (FLOMAX) 0.4 MG CAPS capsule  Take 1 capsule (0.4 mg total) by mouth daily. 90 capsule 1   traZODone (DESYREL) 50 MG tablet Take 1 tablet (50 mg total) by mouth at bedtime as needed for sleep. 30 tablet 0   valsartan (DIOVAN) 80 MG tablet Take 80 mg by mouth daily.     carvedilol (COREG) 6.25 MG tablet Take 1 tablet (6.25 mg total) by mouth 2 (two) times daily with a meal. (Patient not taking: Reported on 01/16/2023) 180 tablet 1   No facility-administered medications prior to visit.

## 2023-01-16 NOTE — Telephone Encounter (Signed)
Patient requested refill.  Pended Rx and sent to Dr. Unk Lightning for approval due to [place].

## 2023-01-16 NOTE — Patient Instructions (Signed)
We will start a pulmonary clearance regimen that will include the following:  -Start with using the hypertonic saline by nebulizer -Then follow the albuterol by nebulizer -Then use the flutter device: please do at least 10-15 blows into the device for at least 2 to 3 seconds each -And finish up with using the incentive spirometer; use for at least 10 times -After doing these, attempt to cough.  Please do these twice a day

## 2023-01-20 ENCOUNTER — Telehealth: Payer: Self-pay | Admitting: Internal Medicine

## 2023-01-20 NOTE — Telephone Encounter (Signed)
Pt calling in to get scheduled for a Sputum test

## 2023-01-20 NOTE — Telephone Encounter (Signed)
I have spoke with Mr. [name] [name] and he is aware to [facility] 01/26/23. Damita Lack in scheduled has emailed the staff letting them know the patient will be coming on these days for induction sputum test

## 2023-01-20 NOTE — Telephone Encounter (Signed)
Spoke to patient. He stated that he is returning a call from [place] to schedule sputum induction.  Synetta Fail, please advise. Thanks

## 2023-01-24 ENCOUNTER — Other Ambulatory Visit
Admission: RE | Admit: 2023-01-24 | Discharge: 2023-01-24 | Disposition: A | Discharge status: Home / Self Care | Payer: Medicare Other | Source: Ambulatory Visit | Attending: Student in an Organized Health Care Education/Training Program | Admitting: Student in an Organized Health Care Education/Training Program

## 2023-01-24 DIAGNOSIS — J479 Bronchiectasis, uncomplicated: Secondary | ICD-10-CM | POA: Insufficient documentation

## 2023-01-24 MED ORDER — SODIUM CHLORIDE 3 % IN NEBU
4.0000 mL | INHALATION_SOLUTION | Freq: Once | RESPIRATORY_TRACT | Status: DC
  Filled 2023-01-24: qty 4

## 2023-01-25 ENCOUNTER — Encounter: Payer: Self-pay | Admitting: Student in an Organized Health Care Education/Training Program

## 2023-01-25 NOTE — Telephone Encounter (Signed)
Flutter Valve and incentive spirometer were both on the same order. Can you check on this? Thank you!

## 2023-01-25 NOTE — Telephone Encounter (Signed)
I sent urgent message to [employer] asking them about this issue

## 2023-01-26 ENCOUNTER — Other Ambulatory Visit
Admission: RE | Admit: 2023-01-26 | Discharge: 2023-01-26 | Disposition: A | Discharge status: Home / Self Care | Payer: Medicare Other | Source: Ambulatory Visit | Attending: Student in an Organized Health Care Education/Training Program | Admitting: Student in an Organized Health Care Education/Training Program

## 2023-01-26 DIAGNOSIS — J479 Bronchiectasis, uncomplicated: Secondary | ICD-10-CM | POA: Diagnosis not present

## 2023-01-26 LAB — ACID FAST SMEAR (AFB, MYCOBACTERIA): Acid Fast Smear: NEGATIVE

## 2023-01-27 NOTE — Telephone Encounter (Signed)
I received a message from [place] with [employer] [facility]ina Evans   I'll follow up with this, and get this taken care of.

## 2023-01-28 LAB — ACID FAST SMEAR (AFB, MYCOBACTERIA): Acid Fast Smear: NEGATIVE

## 2023-01-30 ENCOUNTER — Other Ambulatory Visit: Payer: Self-pay | Admitting: Student

## 2023-01-30 DIAGNOSIS — E785 Hyperlipidemia, unspecified: Secondary | ICD-10-CM | POA: Diagnosis not present

## 2023-01-30 DIAGNOSIS — R7303 Prediabetes: Secondary | ICD-10-CM | POA: Diagnosis not present

## 2023-01-30 DIAGNOSIS — N1831 Chronic kidney disease, stage 3a: Secondary | ICD-10-CM | POA: Diagnosis not present

## 2023-01-30 DIAGNOSIS — I10 Essential (primary) hypertension: Secondary | ICD-10-CM | POA: Diagnosis not present

## 2023-01-31 LAB — COMPLETE METABOLIC PANEL WITH GFR
AG Ratio: 1.2 (calc) (ref 1.0–2.5)
ALT: 21 U/L (ref 9–46)
AST: 18 U/L (ref 10–35)
Albumin: 4.1 g/dL (ref 3.6–5.1)
Alkaline phosphatase (APISO): 60 U/L (ref 35–144)
BUN/Creatinine Ratio: 18 (calc) (ref 6–22)
BUN: 26 mg/dL — ABNORMAL HIGH (ref 7–25)
CO2: 33 mmol/L — ABNORMAL HIGH (ref 20–32)
Calcium: 9.1 mg/dL (ref 8.6–10.3)
Chloride: 101 mmol/L (ref 98–110)
Creat: 1.43 mg/dL — ABNORMAL HIGH (ref 0.70–1.28)
Globulin: 3.4 g/dL (ref 1.9–3.7)
Glucose, Bld: 115 mg/dL — ABNORMAL HIGH (ref 65–99)
Potassium: 5.3 mmol/L (ref 3.5–5.3)
Sodium: 139 mmol/L (ref 135–146)
Total Bilirubin: 0.6 mg/dL (ref 0.2–1.2)
Total Protein: 7.5 g/dL (ref 6.1–8.1)
eGFR: 50 mL/min/{1.73_m2} — ABNORMAL LOW (ref >=60)

## 2023-01-31 LAB — CBC WITH DIFFERENTIAL/PLATELET
Absolute Lymphocytes: 1091 {cells}/uL (ref 850–3900)
Absolute Monocytes: 1222 {cells}/uL — ABNORMAL HIGH (ref 200–950)
Basophils Absolute: 61 {cells}/uL (ref 0–200)
Basophils Relative: 0.6 %
Eosinophils Absolute: 131 {cells}/uL (ref 15–500)
Eosinophils Relative: 1.3 %
HCT: 44.7 % (ref 38.5–50.0)
Hemoglobin: 14.9 g/dL (ref 13.2–17.1)
MCH: 32.3 pg (ref 27.0–33.0)
MCHC: 33.3 g/dL (ref 32.0–36.0)
MCV: 96.8 fL (ref 80.0–100.0)
MPV: 10.1 fL (ref 7.5–12.5)
Monocytes Relative: 12.1 %
Neutro Abs: 7595 {cells}/uL (ref 1500–7800)
Neutrophils Relative %: 75.2 %
Platelets: 202 10*3/uL (ref 140–400)
RBC: 4.62 10*6/uL (ref 4.20–5.80)
RDW: 12.7 % (ref 11.0–15.0)
Total Lymphocyte: 10.8 %
WBC: 10.1 10*3/uL (ref 3.8–10.8)

## 2023-01-31 LAB — HEMOGLOBIN A1C
Hgb A1c MFr Bld: 5.9 %{Hb} — ABNORMAL HIGH (ref <5.7)
Mean Plasma Glucose: 123 mg/dL
eAG (mmol/L): 6.8 mmol/L

## 2023-01-31 LAB — LIPID PANEL
Cholesterol: 110 mg/dL (ref <200)
HDL: 50 mg/dL (ref >=40)
LDL Cholesterol (Calc): 46 mg/dL
Non-HDL Cholesterol (Calc): 60 mg/dL (ref <130)
Total CHOL/HDL Ratio: 2.2 (calc) (ref <5.0)
Triglycerides: 63 mg/dL (ref <150)

## 2023-01-31 LAB — VITAMIN D 25 HYDROXY (VIT D DEFICIENCY, FRACTURES): Vit D, 25-Hydroxy: 46 ng/mL (ref 30–100)

## 2023-01-31 LAB — MAGNESIUM: Magnesium: 2.3 mg/dL (ref 1.5–2.5)

## 2023-02-03 ENCOUNTER — Ambulatory Visit: Payer: Medicare Other | Admitting: Student

## 2023-02-05 ENCOUNTER — Encounter: Payer: Self-pay | Admitting: Student in an Organized Health Care Education/Training Program

## 2023-02-06 ENCOUNTER — Ambulatory Visit: Payer: Medicare Other | Admitting: Student

## 2023-02-07 ENCOUNTER — Encounter: Payer: Self-pay | Admitting: Nurse Practitioner

## 2023-02-07 ENCOUNTER — Ambulatory Visit: Payer: Medicare Other | Admitting: Nurse Practitioner

## 2023-02-07 VITALS — BP 124/68 | HR 77 | Temp 98.4°F | Ht 72.0 in | Wt 187.5 lb

## 2023-02-07 DIAGNOSIS — G4700 Insomnia, unspecified: Secondary | ICD-10-CM

## 2023-02-07 DIAGNOSIS — F3342 Major depressive disorder, recurrent, in full remission: Secondary | ICD-10-CM

## 2023-02-07 DIAGNOSIS — J479 Bronchiectasis, uncomplicated: Secondary | ICD-10-CM

## 2023-02-07 DIAGNOSIS — N1831 Chronic kidney disease, stage 3a: Secondary | ICD-10-CM | POA: Diagnosis not present

## 2023-02-07 DIAGNOSIS — R7303 Prediabetes: Secondary | ICD-10-CM | POA: Diagnosis not present

## 2023-02-07 MED ORDER — SERTRALINE HCL 50 MG PO TABS: 50.0000 mg | ORAL_TABLET | Freq: Every day | ORAL | Status: DC

## 2023-02-07 NOTE — Patient Instructions (Signed)
Decrease zoloft to 50 mg alternating with 75 mg every other day for 2 weeks- if you still feel okay then decrease to 50 mg daily    Decrease sugar intake.

## 2023-02-07 NOTE — Progress Notes (Signed)
Careteam: Patient Care Team: Earnestine Mealing, MD as PCP - General (Family Medicine) Wyline Mood Dorothe Pea, MD as PCP - Cardiology (Cardiology) Comer, Belia Heman, MD as Consulting Physician (Infectious Diseases) Lanelle Bal, DO as Consulting Physician (Internal Medicine)  Advanced Directive information Does Patient Have a Medical Advance Directive?: Yes, Type of Advance Directive: Healthcare Power of [place];Living will;Out of facility DNR (pink MOST or yellow form), Pre-existing out of facility DNR order (yellow form or pink MOST form): Yellow form placed in chart (order not valid for inpatient use), Does patient want to make changes to medical advance directive?: No - Patient declined  No Known Allergies  Chief Complaint  Patient presents with   Medical Management of Chronic Issues    2 month follow-up. Discuss need for covid booster (up to date) and AWV. Lung issue worse since starting albuterol and hypertonic solution. Sputum test showed abnormal fungus. Discuss zoloft. Discuss recent labs.      HPI: Patient is a 77 y.o. male seen in today at the [facility] for follow up.   He reports he was doing much better on zoloft but with the election unsure if it is a good time to come off zoloft.  He is not using trazodone at this time. Sleeping well.   Has pulmonologist, has bronchiectasis which has progressively worsen.  In the last few days (2-3 days) more shortness of breath. Feels like he has a low grade fever.  Had some headaches and myalgias Tested himself for covid and was negative.    He is not able to do the treadmill like he usually is able to do  Reviewed labs with pt  Review of Systems:  Review of Systems  Constitutional:  Positive for malaise/fatigue. Negative for chills, fever and weight loss.  HENT:  Negative for tinnitus.   Respiratory:  Positive for cough, sputum production and shortness of breath.   Cardiovascular:  Negative for chest pain,  palpitations and leg swelling.  Gastrointestinal:  Negative for abdominal pain, constipation, diarrhea and heartburn.  Genitourinary:  Negative for dysuria, frequency and urgency.  Musculoskeletal:  Negative for back pain, falls, joint pain and myalgias.  Skin: Negative.   Neurological:  Positive for headaches. Negative for dizziness.  Psychiatric/Behavioral:  Positive for depression (controlled). Negative for memory loss. The patient does not have insomnia.     Past Medical History:  Diagnosis Date   Acute systolic heart failure (HCC)    Anxiety    COPD (chronic obstructive pulmonary disease) (HCC)    Enlarged prostate with lower urinary tract symptoms (LUTS)    GERD (gastroesophageal reflux disease)    Hyperlipidemia    Hypertension    Low serum testosterone level    PAF (paroxysmal atrial fibrillation) (HCC)    Pneumonia 05/2014   Pyothorax without fistula (HCC)    Sciatica    Shortness of breath dyspnea    Solitary pulmonary nodule    STEMI (ST elevation myocardial infarction) (HCC)    09/27/17 PCI/DES x1 mLAD, PTCA of the diag branch. EF 30% Lifevest at discharge.    Testicular hypofunction    Urinary frequency    Past Surgical History:  Procedure Laterality Date   APPENDECTOMY     BACK SURGERY     COLONOSCOPY  2011   Tubular adenoma per patient   COLONOSCOPY N/A 03/03/2015   Dr. Darrick Penna: Left colon is redundant, mild diverticulosis, small internal hemorrhoids, moderate external hemorrhoids.  Next colonoscopy in 5 years with overtube.  COLONOSCOPY N/A 03/25/2019   Procedure: COLONOSCOPY;  Surgeon: West Bali, MD;  Location: [facility];  Service: Endoscopy;  Laterality: N/A;  8:30am - Camille's request   CORONARY ULTRASOUND/IVUS N/A 09/27/2017   Procedure: Intravascular Ultrasound/IVUS;  Surgeon: Kathleene Hazel, MD;  Location: [facility];  Service: Cardiovascular;  Laterality: N/A;   CORONARY/GRAFT ACUTE MI REVASCULARIZATION N/A 09/27/2017    Procedure: Coronary/Graft Acute MI Revascularization;  Surgeon: Kathleene Hazel, MD;  Location: [facility] CV [facility];  Service: Cardiovascular;  Laterality: N/A;   CYSTOSCOPY W/ RETROGRADES Bilateral 06/16/2020   Procedure: CYSTOSCOPY WITH RETROGRADE PYELOGRAM;  Surgeon: Riki Altes, MD;  Location: [facility] ORS;  Service: Urology;  Laterality: Bilateral;   EMPYEMA DRAINAGE Right 06/27/2014   Procedure: EMPYEMA DRAINAGE;  Surgeon: Delight Ovens, MD;  Location[facility] OR;  Service: Thoracic;  Laterality: Right;   HEMORRHOID BANDING N/A 03/03/2015   Procedure: Stanton Lions;  Surgeon: West Bali, MD;  Location: [facility];  Service: Endoscopy;  Laterality: N/A;  Recomended to see a Careers adviser.   HERNIA REPAIR     LEFT HEART CATH AND CORONARY ANGIOGRAPHY N/A 09/27/2017   Procedure: LEFT HEART CATH AND CORONARY ANGIOGRAPHY;  Surgeon: Kathleene Hazel, MD;  Location: [facility];  Service: Cardiovascular;  Laterality: N/A;   LUNG SURGERY  2016   POLYPECTOMY  03/25/2019   Procedure: POLYPECTOMY;  Surgeon: West Bali, MD;  Location: [facility];  Service: Endoscopy;;   TEE WITHOUT CARDIOVERSION N/A 07/14/2014   Procedure: TRANSESOPHAGEAL ECHOCARDIOGRAM (TEE);  Surgeon: Quintella Reichert, MD;  Location: [facility] ENDOSCOPY;  Service: Cardiovascular;  Laterality: N/A;   TRANSURETHRAL RESECTION OF BLADDER TUMOR N/A 06/16/2020   Procedure: TRANSURETHRAL RESECTION OF BLADDER TUMOR (TURBT) WITH GEMCITABINE;  Surgeon: Riki Altes, MD;  Location: [facility] ORS;  Service: Urology;  Laterality: N/A;   VARICOSE VEIN SURGERY     VIDEO ASSISTED THORACOSCOPY Right 06/27/2014   Procedure: VIDEO ASSISTED THORACOSCOPY;  Surgeon: Delight Ovens, MD;  Location: [facility] OR;  Service: Thoracic;  Laterality: Right;   VIDEO BRONCHOSCOPY N/A 06/27/2014   Procedure: VIDEO BRONCHOSCOPY;  Surgeon: Delight Ovens, MD;  Location: [facility] OR;  Service: Thoracic;  Laterality: N/A;   Social History:   reports that he  quit smoking about 8 years ago. His smoking use included pipe and cigarettes. He started smoking about 18 years ago. He has a 10 pack-year smoking history. He has been exposed to tobacco smoke. He has never used smokeless tobacco. He reports that he does not currently use alcohol. He reports that he does not use drugs.  Family History  Problem Relation Age of Onset   Tuberculosis Father    Hypertension Father    COPD Mother    Hypertension Mother    Colon cancer Neg Hx     Medications: Patient's Medications  New Prescriptions   No medications on file  Previous Medications   ACETAMINOPHEN (TYLENOL) 500 MG TABLET    Take 500 mg by mouth every 6 (six) hours as needed for moderate pain.   ALBUTEROL (PROVENTIL) (2.5 MG/3ML) 0.083% NEBULIZER SOLUTION    Take 3 mLs (2.5 mg total) by nebulization in the morning and at bedtime.   ASPIRIN EC 81 MG TABLET    Take 1 tablet (81 mg total) by mouth daily.   CALCIUM CITRATE-VITAMIN D (CALCIUM CITRATE + D3 PO)    Take 1 tablet by mouth daily.   CARBOXYMETHYLCELLUL-GLYCERIN (LUBRICATING EYE DROPS OP)  Place 1 drop into both eyes 2 (two) times daily. As needed   CARVEDILOL (COREG) 6.25 MG TABLET    Take 1 tablet (6.25 mg total) by mouth 2 (two) times daily with a meal.   CYCLOBENZAPRINE (FLEXERIL) 5 MG TABLET    Take 5 mg by mouth at bedtime. As needed for lower back pain.   DEXTROMETHORPHAN-GUAIFENESIN (MUCUS RELIEF DM PO)    Take by mouth. As needed.   FAMOTIDINE (PEPCID) 20 MG TABLET    One after supper   FARXIGA 10 MG TABS TABLET    TAKE 1 TABLET(10 MG) BY MOUTH DAILY   MENTHOL, TOPICAL ANALGESIC, (BIOFREEZE EX)    Apply topically.   MULTIPLE VITAMINS-MINERALS (SENIOR VITES PO)    Take 1 tablet by mouth daily.   NITROGLYCERIN (NITROSTAT) 0.4 MG SL TABLET    Place 1 tablet (0.4 mg total) under the tongue every 5 (five) minutes as needed.   PANTOPRAZOLE (PROTONIX) 40 MG TABLET    Take 1 tablet (40 mg total) by mouth daily. Take 30-60 min before first  meal of the day   ROSUVASTATIN (CRESTOR) 40 MG TABLET    Take 1 tablet (40 mg total) by mouth daily.   SERTRALINE (ZOLOFT) 25 MG TABLET    Take 1 tablet (25 mg total) by mouth daily.   SERTRALINE (ZOLOFT) 50 MG TABLET    Take 75 mg by mouth daily.   SODIUM CHLORIDE HYPERTONIC 3 % NEBULIZER SOLUTION    Take by nebulization in the morning and at bedtime.   TAMSULOSIN (FLOMAX) 0.4 MG CAPS CAPSULE    Take 1 capsule (0.4 mg total) by mouth daily.   TRAZODONE (DESYREL) 50 MG TABLET    Take 1 tablet (50 mg total) by mouth at bedtime as needed for sleep.   VALSARTAN (DIOVAN) 80 MG TABLET    Take 80 mg by mouth daily.  Modified Medications   No medications on file  Discontinued Medications   No medications on file    Physical Exam:  Vitals:   02/07/23 0823  BP: 124/68  Pulse: 77  Temp: 98.4 F (36.9 C)  TempSrc: Temporal  SpO2: 96%  Weight: 187 lb 8 oz (85 kg)  Height: 6' (1.829 m)   Body mass index is 25.43 kg/m. Wt Readings from Last 3 Encounters:  02/07/23 187 lb 8 oz (85 kg)  01/16/23 185 lb 6.4 oz (84.1 kg)  12/07/22 186 lb (84.4 kg)    Physical Exam Constitutional:      General: He is not in acute distress.    Appearance: He is well-developed. He is not diaphoretic.  HENT:     Head: Normocephalic and atraumatic.     Right Ear: External ear normal.     Left Ear: External ear normal.     Mouth/Throat:     Pharynx: No oropharyngeal exudate.  Eyes:     Conjunctiva/sclera: Conjunctivae normal.     Pupils: Pupils are equal, round, and reactive to light.  Cardiovascular:     Rate and Rhythm: Normal rate and regular rhythm.     Heart sounds: Normal heart sounds.  Pulmonary:     Effort: Pulmonary effort is normal.     Breath sounds: Normal breath sounds.  Abdominal:     General: Bowel sounds are normal.     Palpations: Abdomen is soft.  Musculoskeletal:        General: No tenderness.     Cervical back: Normal range of motion and neck supple.  Right lower leg: No  edema.     Left lower leg: No edema.  Skin:    General: Skin is warm and dry.  Neurological:     Mental Status: He is alert and oriented to person, place, and time.     Labs reviewed: Basic Metabolic Panel: Recent Labs    02/16/22 1111 08/12/22 0000 01/30/23 0804  NA 143  --  139  K 5.2  --  5.3  CL 104  --  101  CO2 24  --  33*  GLUCOSE 88  --  115*  BUN 24 19 26*  CREATININE 1.57* 1.3 1.43*  CALCIUM 9.5  --  9.1  MG  --   --  2.3   Liver Function Tests: Recent Labs    01/30/23 0804  AST 18  ALT 21  BILITOT 0.6  PROT 7.5   No results for input(s): "LIPASE", "AMYLASE" in the last 8760 hours. No results for input(s): "AMMONIA" in the last 8760 hours. CBC: Recent Labs    08/12/22 0000 01/30/23 0804  WBC 11.5 10.1  NEUTROABS  --  7,595  HGB 14.6 14.9  HCT 45 44.7  MCV  --  96.8  PLT  --  202   Lipid Panel: Recent Labs    08/12/22 0000 01/30/23 0804  CHOL 95 110  HDL 47 50  LDLCALC 15 46  TRIG 69 63  CHOLHDL  --  2.2   TSH: No results for input(s): "TSH" in the last 8760 hours. A1C: Lab Results  Component Value Date   HGBA1C 5.9 (H) 01/30/2023     Assessment/Plan 1. Stage 3a chronic kidney disease (HCC) Discussed labs with pt.  Chronic and stable Encourage proper hydration Follow metabolic panel Avoid nephrotoxic meds (NSAIDS)  2. Insomnia, unspecified type Sleeping well at this tme. Not using trazodone.   3. Bronchiectasis without complication (HCC) Having increase in cough and congestion over the past few days. Awaiting to discuss recent lab work with pulmonary, he has put in a mychart msg to them and will see pulmonary tomorrow to follow up on labs with recent increase in shortness of breath which has slowly progresses, cough, and congestion. Discussed they will likely follow up chest xray and get blood work- since we are at clinic at [facility] labs not available today and tomorrow would be earliest we could obtain   4.  Pre-diabetes Discussed with pt, continue dietary modifications   5. Recurrent major depressive disorder, in full remission [facility]) He is currently doing well- plan to titrate zoloft to 50 mg daily at this time.  He will notify for any issues with titration or increase in depression symptoms.   Janene Harvey. Biagio Borg  [facility] [phone]

## 2023-02-08 ENCOUNTER — Telehealth: Payer: Self-pay | Admitting: Student in an Organized Health Care Education/Training Program

## 2023-02-08 ENCOUNTER — Ambulatory Visit
Admission: RE | Admit: 2023-02-08 | Discharge: 2023-02-08 | Disposition: A | Discharge status: Home / Self Care | Payer: Medicare Other | Source: Ambulatory Visit | Attending: Student in an Organized Health Care Education/Training Program

## 2023-02-08 ENCOUNTER — Ambulatory Visit
Admission: RE | Admit: 2023-02-08 | Discharge: 2023-02-08 | Disposition: A | Discharge status: Home / Self Care | Payer: Medicare Other | Attending: Student in an Organized Health Care Education/Training Program | Admitting: Student in an Organized Health Care Education/Training Program

## 2023-02-08 ENCOUNTER — Ambulatory Visit (INDEPENDENT_AMBULATORY_CARE_PROVIDER_SITE_OTHER): Payer: Medicare Other | Admitting: Student in an Organized Health Care Education/Training Program

## 2023-02-08 ENCOUNTER — Encounter: Payer: Self-pay | Admitting: Student in an Organized Health Care Education/Training Program

## 2023-02-08 VITALS — BP 136/66 | HR 77 | Temp 97.7°F | Ht 72.0 in | Wt 188.6 lb

## 2023-02-08 DIAGNOSIS — J479 Bronchiectasis, uncomplicated: Secondary | ICD-10-CM

## 2023-02-08 DIAGNOSIS — Z79899 Other long term (current) drug therapy: Secondary | ICD-10-CM

## 2023-02-08 DIAGNOSIS — J439 Emphysema, unspecified: Secondary | ICD-10-CM | POA: Diagnosis not present

## 2023-02-08 DIAGNOSIS — R918 Other nonspecific abnormal finding of lung field: Secondary | ICD-10-CM | POA: Diagnosis not present

## 2023-02-08 MED ORDER — SODIUM CHLORIDE 3 % IN NEBU: INHALATION_SOLUTION | Freq: Two times a day (BID) | RESPIRATORY_TRACT | 11 refills | Status: DC

## 2023-02-08 MED ORDER — ALBUTEROL SULFATE (2.5 MG/3ML) 0.083% IN NEBU: 2.5000 mg | INHALATION_SOLUTION | Freq: Two times a day (BID) | RESPIRATORY_TRACT | 11 refills | Status: DC

## 2023-02-08 MED ORDER — LEVOFLOXACIN 750 MG PO TABS: 750.0000 mg | ORAL_TABLET | Freq: Every day | ORAL | 0 refills | Status: DC

## 2023-02-08 NOTE — Telephone Encounter (Signed)
Patient was just seen in office and needs the prescription levofloxacin sent to pharmacy [facility] on [address] in [place]. It seems that it was sent Digitally but patient states that pharmacy does not have it.

## 2023-02-08 NOTE — Telephone Encounter (Signed)
I spoke with the pharmacy and they confirmed that they did not get the Levaquin prescription. I have reordered the medication. And left a message on the patient's secure voicemail notifying him.  Nothing further needed.

## 2023-02-08 NOTE — Progress Notes (Signed)
Synopsis: Referred in for bronchiectasis by Earnestine Mealing, MD  Assessment & Plan:   1. Bronchiectasis without complication (HCC) 2. Chronic Cough  He is presenting today for follow up of bronchiectasis which is a direct result of his severe RLL pneumonia and empyema in 2016. Imaging studies show airway thickening with retained mucus and tree-in-bud in the RLL which I suspect due to retained secretions and in ability to expectorate, possibly resulting in a super imposed infection. His previous cultures from July of 2024 had grown candida, and I had ordered repeat induced sputa during our last visit to assess for MAC vs other bacterial or fungal infections. So far, the fungal cultures have returned Candida but afb cultures remain no growth to date  He did report worsening in symptoms following initiation of the pulmonary clearance regimen to help mobilize secretions (hypertonic saline, albuterol, flutter device, and incentive spirometer). Given this it is possible that his bronchiectasis has acted up and this could represent a mild exacerbation of bronchiectasis. I will treat with a course of antibiotics, preferring levofloxacin given anti-pseudomonal coverage. I have ran an interaction check via uptodate which doesn't show significant interactions with levofloxacin. Furthermore, given he is on sertraline, we have performed an EKG in clinic today that shows his QTc to be within normal (350 msec).  Finally, I have resent his prescriptions for hypertonic saline and albuterol given his initial prescription was incorrectly filled by the pharmacy. I have also ordered a chest xray today to ensure no focal pneumonia.  - DG Chest 2 View; Future > no focal pneumonia - albuterol (PROVENTIL) (2.5 MG/3ML) 0.083% nebulizer solution; Take 3 mLs (2.5 mg total) by nebulization in the morning and at bedtime.  Dispense: 250 mL; Refill: 11 - sodium chloride HYPERTONIC 3 % nebulizer solution; Take by nebulization in  the morning and at bedtime.  Dispense: 1500 mL; Refill: 11 - EKG 12-Lead   Return in about 2 weeks (around 02/22/2023).  I spent 30 minutes caring for this patient today, including preparing to see the patient, obtaining a medical history , reviewing a separately obtained history, performing a medically appropriate examination and/or evaluation, counseling and educating the patient/family/caregiver, ordering medications, tests, or procedures, documenting clinical information in the electronic health record, and independently interpreting results (not separately reported/billed) and communicating results to the patient/family/caregiver  Raechel Chute, MD [facility] 02/08/2023 8:53 PM    End of visit medications:  Meds ordered this encounter  Medications   DISCONTD: levofloxacin (LEVAQUIN) 750 MG tablet    Sig: Take 1 tablet (750 mg total) by mouth daily.    Dispense:  10 tablet    Refill:  0   albuterol (PROVENTIL) (2.5 MG/3ML) 0.083% nebulizer solution    Sig: Take 3 mLs (2.5 mg total) by nebulization in the morning and at bedtime.    Dispense:  250 mL    Refill:  11   sodium chloride HYPERTONIC 3 % nebulizer solution    Sig: Take by nebulization in the morning and at bedtime.    Dispense:  1500 mL    Refill:  11     Current Outpatient Medications:    acetaminophen (TYLENOL) 500 MG tablet, Take 500 mg by mouth every 6 (six) hours as needed for moderate pain., Disp: , Rfl:    aspirin EC 81 MG tablet, Take 1 tablet (81 mg total) by mouth daily., Disp: 90 tablet, Rfl: 3   Calcium Citrate-Vitamin D (CALCIUM CITRATE + D3 PO), Take 1 tablet  by mouth daily., Disp: , Rfl:    Carboxymethylcellul-Glycerin (LUBRICATING EYE DROPS OP), Place 1 drop into both eyes 2 (two) times daily. As needed, Disp: , Rfl:    carvedilol (COREG) 6.25 MG tablet, Take 1 tablet (6.25 mg total) by mouth 2 (two) times daily with a meal., Disp: 180 tablet, Rfl: 1   Dextromethorphan-guaiFENesin  (MUCUS RELIEF DM PO), Take by mouth. As needed., Disp: , Rfl:    FARXIGA 10 MG TABS tablet, TAKE 1 TABLET(10 MG) BY MOUTH DAILY, Disp: 90 tablet, Rfl: 3   Menthol, Topical Analgesic, (BIOFREEZE EX), Apply topically., Disp: , Rfl:    Multiple Vitamins-Minerals (SENIOR VITES PO), Take 1 tablet by mouth daily., Disp: , Rfl:    nitroGLYCERIN (NITROSTAT) 0.4 MG SL tablet, Place 1 tablet (0.4 mg total) under the tongue every 5 (five) minutes as needed., Disp: 25 tablet, Rfl: 3   rosuvastatin (CRESTOR) 40 MG tablet, Take 1 tablet (40 mg total) by mouth daily., Disp: 90 tablet, Rfl: 1   sertraline (ZOLOFT) 50 MG tablet, Take 1 tablet (50 mg total) by mouth daily., Disp: , Rfl:    tamsulosin (FLOMAX) 0.4 MG CAPS capsule, Take 1 capsule (0.4 mg total) by mouth daily., Disp: 90 capsule, Rfl: 1   valsartan (DIOVAN) 80 MG tablet, Take 80 mg by mouth daily., Disp: , Rfl:    albuterol (PROVENTIL) (2.5 MG/3ML) 0.083% nebulizer solution, Take 3 mLs (2.5 mg total) by nebulization in the morning and at bedtime., Disp: 250 mL, Rfl: 11   cyclobenzaprine (FLEXERIL) 5 MG tablet, Take 5 mg by mouth at bedtime. As needed for lower back pain. (Patient not taking: Reported on 02/08/2023), Disp: , Rfl:    levofloxacin (LEVAQUIN) 750 MG tablet, Take 1 tablet (750 mg total) by mouth daily., Disp: 10 tablet, Rfl: 0   sodium chloride HYPERTONIC 3 % nebulizer solution, Take by nebulization in the morning and at bedtime., Disp: 1500 mL, Rfl: 11   Subjective:   PATIENT ID: [name] [name] GENDER: male DOB: 14-Jan-1946, MRN: [id number]  Chief Complaint  Patient presents with   Follow-up    Fever. Temp 98.4 yesterday. Shortness of breath on exertion. Cough and wheezing. Negative COVID test yesterday.     HPI  Patient is a pleasant 77 year old male presenting for follow up. I first met Mr. Athas on 01/16/2023. He was previously followed by Dr. Sherene Sires at our [place] location and has transitioned his care over the our [place]  office.  During our previous visit, he'd reported a cough that was persistent. This cough was previously productive but had changed nature and became unproductive. Imaging studies, including multiple chest CT, had shown bronchiectasis in the RLL. He was not on inhalers at the time of our visit, reporting minimal improvement after trying them previously. He reported exacerbations in his breathing a few times a year with fever and increase in sputum production.   We initiated a pulmonary clearance regimen during our last visit and the patient felt somewhat worse after starting it. He reports that his cough worsened and he also reported some chills alongside it. He was concerned that the pulmonary clearance regimen was resulting in worsening symptoms. Symptoms remain unchanged otherwise, aside from mild increase in shortness of breath accompanying the cough.    Patient reports that his respiratory symptoms all started in 2016 following a pulmonary infection that was complicated by empyema. Review of the medical record is notable for an admission in April of 2016 where he was seen  for empyema and underwent flexible bronchoscopy in addition to VATS decortication of the visceral pleura. CT abdomen/pelvis March 2016 showed a RLL pneuomonia, while CT chest from April 7 of 2016 showed a loculated pleural effusion consistent with empyema. The following CT from April 20 of 2016 showed persistent collapse/atelectasis in the RLL. June 2016 imaging showed re-expansion of the RLL with post infectious fibrosis and scarring. A CT chest from August of 2017 showed very mild bronchiolectasis with post surgical changes. Bronchiectasis begins to be evident in imaging from march of 2019, with associated tree-in-bud opacities in addition to the ectatic and dilated bronchi.   Patient previously worked as an Programmer, systems, Holiday representative in college. He is a former smoker, with around 10 pack years of smoking history, in  addition to smoking pipe for over 40 years. He is currently retired.  Ancillary information including prior medications, full medical/surgical/family/social histories, and PFTs (when available) are listed below and have been reviewed.   Review of Systems  Constitutional:  Negative for chills, fever and weight loss.  Respiratory:  Positive for cough. Negative for hemoptysis, sputum production, shortness of breath and wheezing.   Cardiovascular:  Negative for chest pain.     Objective:   Vitals:   02/08/23 1435  BP: 136/66  Pulse: 77  Temp: 97.7 F (36.5 C)  TempSrc: Oral  SpO2: 94%  Weight: 188 lb 9.6 oz (85.5 kg)  Height: 6' (1.829 m)   94% on RA BMI Readings from Last 3 Encounters:  02/08/23 25.58 kg/m  02/07/23 25.43 kg/m  01/16/23 25.14 kg/m   Wt Readings from Last 3 Encounters:  02/08/23 188 lb 9.6 oz (85.5 kg)  02/07/23 187 lb 8 oz (85 kg)  01/16/23 185 lb 6.4 oz (84.1 kg)    Physical Exam Constitutional:      Appearance: Normal appearance.  Cardiovascular:     Rate and Rhythm: Normal rate and regular rhythm.     Pulses: Normal pulses.     Heart sounds: Normal heart sounds.  Pulmonary:     Effort: Pulmonary effort is normal.     Breath sounds: Normal breath sounds. No wheezing, rhonchi or rales.  Musculoskeletal:     Right lower leg: No edema.     Left lower leg: No edema.  Neurological:     General: No focal deficit present.     Mental Status: He is alert and oriented to person, place, and time. Mental status is at baseline.       Ancillary Information    Past Medical History:  Diagnosis Date   Acute systolic heart failure (HCC)    Anxiety    COPD (chronic obstructive pulmonary disease) (HCC)    Enlarged prostate with lower urinary tract symptoms (LUTS)    GERD (gastroesophageal reflux disease)    Hyperlipidemia    Hypertension    Low serum testosterone level    PAF (paroxysmal atrial fibrillation) (HCC)    Pneumonia 05/2014   Pyothorax  without fistula (HCC)    Sciatica    Shortness of breath dyspnea    Solitary pulmonary nodule    STEMI (ST elevation myocardial infarction) (HCC)    09/27/17 PCI/DES x1 mLAD, PTCA of the diag branch. EF 30% Lifevest at discharge.    Testicular hypofunction    Urinary frequency      Family History  Problem Relation Age of Onset   Tuberculosis Father    Hypertension Father    COPD Mother    Hypertension Mother  Colon cancer Neg Hx      Past Surgical History:  Procedure Laterality Date   APPENDECTOMY     BACK SURGERY     COLONOSCOPY  2011   Tubular adenoma per patient   COLONOSCOPY N/A 03/03/2015   Dr. Darrick Penna: Left colon is redundant, mild diverticulosis, small internal hemorrhoids, moderate external hemorrhoids.  Next colonoscopy in 5 years with overtube.   COLONOSCOPY N/A 03/25/2019   Procedure: COLONOSCOPY;  Surgeon: West Bali, MD;  Location: [facility];  Service: Endoscopy;  Laterality: N/A;  8:30am - Camille's request   CORONARY ULTRASOUND/IVUS N/A 09/27/2017   Procedure: Intravascular Ultrasound/IVUS;  Surgeon: Kathleene Hazel, MD;  Location: [facility];  Service: Cardiovascular;  Laterality: N/A;   CORONARY/GRAFT ACUTE MI REVASCULARIZATION N/A 09/27/2017   Procedure: Coronary/Graft Acute MI Revascularization;  Surgeon: Kathleene Hazel, MD;  Location: [facility];  Service: Cardiovascular;  Laterality: N/A;   CYSTOSCOPY W/ RETROGRADES Bilateral 06/16/2020   Procedure: CYSTOSCOPY WITH RETROGRADE PYELOGRAM;  Surgeon: Riki Altes, MD;  Location: [facility] OR[facility];  Service: Urology;  Laterality: Bilateral;   EMPYEMA DRAINAGE Right 06/27/2014   Procedure: EMPYEMA DRAINAGE;  Surgeon: Delight Ovens, MD;  Location: [facility] OR;  Service: Thoracic;  Laterality: Right;   HEMORRHOID BANDING N/A 03/03/2015   Procedure: [place];  Surgeon: West Bali, MD;  Location: [facility];  Service: Endoscopy;  Laterality: N/A;  Recomended to see a  Careers adviser.   HERNIA REPAIR     LEFT HEART CATH AND CORONARY ANGIOGRAPHY N/A 09/27/2017   Procedure: LEFT HEART CATH AND CORONARY ANGIOGRAPHY;  Surgeon: Kathleene Hazel, MD;  [facility]: [facility];  Service: Cardiovascular;  Laterality: N/A;   LUNG SURGERY  2016   POLYPECTOMY  03/25/2019   Procedure: POLYPECTOMY;  Surgeon: West Bali, MD;  Location: [facility];  Service: Endoscopy;;   TEE WITHOUT CARDIOVERSION N/A 07/14/2014   Procedure: TRANSESOPHAGEAL ECHOCARDIOGRAM (TEE);  Surgeon: Quintella Reichert, MD;  Location[facility];  Service: Cardiovascular;  Laterality: N/A;   TRANSURETHRAL RESECTION OF BLADDER TUMOR N/A 06/16/2020   Procedure: TRANSURETHRAL RESECTION OF BLADDER TUMOR (TURBT) WITH GEMCITABINE;  Surgeon: Riki Altes, MD;  Location: [facility] ORS;  Service: Urology;  Laterality: N/A;   VARICOSE VEIN SURGERY     VIDEO ASSISTED THORACOSCOPY Right 06/27/2014   Procedure: VIDEO ASSISTED THORACOSCOPY;  Surgeon: Delight Ovens, MD;  Location: [facility] OR;  Service: Thoracic;  Laterality: Right;   VIDEO BRONCHOSCOPY N/A 06/27/2014   Procedure: VIDEO BRONCHOSCOPY;  Surgeon: Delight Ovens, MD;  Location: [facility] OR;  Service: Thoracic;  Laterality: N/A;    Social History   Socioeconomic History   Marital status: Married    Spouse name: Not on file   Number of children: Not on file   Years of education: Not on file   Highest education level: Not on file  Occupational History   Not on file  Tobacco Use   Smoking status: Former    Current packs/day: 0.00    Average packs/day: 1 pack/day for 10.0 years (10.0 ttl pk-yrs)    Types: Pipe, Cigarettes    Start date: 06/06/2004    Quit date: 06/07/2014    Years since quitting: 8.6    Passive exposure: Past   Smokeless tobacco: Never   Tobacco comments:    smoked pipe for 40 years, daily  Vaping Use   Vaping status: Never Used  Substance and Sexual Activity   Alcohol use:  Not Currently    Comment: Average 1-2 drinks of beer  in the evening; 04/29/20 none since 01/2020   Drug use: No   Sexual activity: Not on file  Other Topics Concern   Not on file  Social History Narrative   Not on file   Social Determinants of Health   Financial Resource Strain: Not on file  Food Insecurity: Unknown (05/24/2022)   [facility]   Hunger Vital Sign    Worried About Running Out of Food in the Last Year: Patient declined to answer    Ran Out of Food in the Last Year: Patient declined to answer  Transportation Needs: Not on file (05/24/2022)  Physical Activity: Not on file  Stress: Not on file  Social Connections: Not on file  Intimate Partner Violence: Not on file     No Known Allergies   CBC    Component Value Date/Time   WBC 10.1 01/30/2023 0804   RBC 4.62 01/30/2023 0804   HGB 14.9 01/30/2023 0804   HGB 15.1 01/10/2022 1049   HCT 44.7 01/30/2023 0804   HCT 45.9 01/10/2022 1049   PLT 202 01/30/2023 0804   PLT 239 01/10/2022 1049   MCV 96.8 01/30/2023 0804   MCV 96 01/10/2022 1049   MCH 32.3 01/30/2023 0804   MCHC 33.3 01/30/2023 0804   RDW 12.7 01/30/2023 0804   RDW 12.2 01/10/2022 1049   LYMPHSABS 1.3 01/10/2022 1049   MONOABS 1.2 (H) 12/13/2019 1006   EOSABS 131 01/30/2023 0804   EOSABS 0.2 01/10/2022 1049   BASOSABS 61 01/30/2023 0804   BASOSABS 0.1 01/10/2022 1049    Pulmonary Functions Testing Results:    Latest Ref Rng & Units 08/06/2019    1:47 PM  PFT Results  FVC-Pre L 3.80   FVC-Predicted Pre % 80   FVC-Post L 4.08   FVC-Predicted Post % 86   Pre FEV1/FVC % % 60   Post FEV1/FCV % % 64   FEV1-Pre L 2.26   FEV1-Predicted Pre % 66   FEV1-Post L 2.60   DLCO uncorrected ml/min/mmHg 18.09   DLCO UNC% % 66   DLCO corrected ml/min/mmHg 18.09   DLCO COR %Predicted % 66   DLVA Predicted % 79     Outpatient Medications Prior to Visit  Medication Sig Dispense Refill   acetaminophen (TYLENOL) 500 MG tablet Take 500 mg by mouth every 6 (six) hours as needed for  moderate pain.     aspirin EC 81 MG tablet Take 1 tablet (81 mg total) by mouth daily. 90 tablet 3   Calcium Citrate-Vitamin D (CALCIUM CITRATE + D3 PO) Take 1 tablet by mouth daily.     Carboxymethylcellul-Glycerin (LUBRICATING EYE DROPS OP) Place 1 drop into both eyes 2 (two) times daily. As needed     carvedilol (COREG) 6.25 MG tablet Take 1 tablet (6.25 mg total) by mouth 2 (two) times daily with a meal. 180 tablet 1   Dextromethorphan-guaiFENesin (MUCUS RELIEF DM PO) Take by mouth. As needed.     FARXIGA 10 MG TABS tablet TAKE 1 TABLET(10 MG) BY MOUTH DAILY 90 tablet 3   Menthol, Topical Analgesic, (BIOFREEZE EX) Apply topically.     Multiple Vitamins-Minerals (SENIOR VITES PO) Take 1 tablet by mouth daily.     nitroGLYCERIN (NITROSTAT) 0.4 MG SL tablet Place 1 tablet (0.4 mg total) under the tongue every 5 (five) minutes as needed. 25 tablet 3   rosuvastatin (CRESTOR) 40 MG tablet Take 1  tablet (40 mg total) by mouth daily. 90 tablet 1   sertraline (ZOLOFT) 50 MG tablet Take 1 tablet (50 mg total) by mouth daily.     tamsulosin (FLOMAX) 0.4 MG CAPS capsule Take 1 capsule (0.4 mg total) by mouth daily. 90 capsule 1   valsartan (DIOVAN) 80 MG tablet Take 80 mg by mouth daily.     albuterol (PROVENTIL) (2.5 MG/3ML) 0.083% nebulizer solution Take 3 mLs (2.5 mg total) by nebulization in the morning and at bedtime. 180 mL 11   sodium chloride HYPERTONIC 3 % nebulizer solution Take by nebulization in the morning and at bedtime. 750 mL 11   cyclobenzaprine (FLEXERIL) 5 MG tablet Take 5 mg by mouth at bedtime. As needed for lower back pain. (Patient not taking: Reported on 02/08/2023)     No facility-administered medications prior to visit.

## 2023-02-08 NOTE — Telephone Encounter (Signed)
Discussed during today's clinic visit.

## 2023-02-09 MED ORDER — TRELEGY ELLIPTA 100-62.5-25 MCG/ACT IN AEPB
1.0000 | INHALATION_SPRAY | Freq: Every day | RESPIRATORY_TRACT | Status: DC
Start: 2023-02-09 — End: 2023-03-08

## 2023-02-10 ENCOUNTER — Encounter: Payer: Self-pay | Admitting: Student in an Organized Health Care Education/Training Program

## 2023-02-27 LAB — FUNGUS CULTURE WITH STAIN

## 2023-02-27 LAB — FUNGUS CULTURE RESULT

## 2023-02-27 LAB — FUNGAL ORGANISM REFLEX

## 2023-03-08 ENCOUNTER — Encounter: Payer: Self-pay | Admitting: Student in an Organized Health Care Education/Training Program

## 2023-03-08 ENCOUNTER — Ambulatory Visit: Payer: Medicare Other | Admitting: Student in an Organized Health Care Education/Training Program

## 2023-03-08 VITALS — BP 106/60 | HR 56 | Temp 97.9°F | Ht 72.0 in | Wt 187.6 lb

## 2023-03-08 DIAGNOSIS — R053 Chronic cough: Secondary | ICD-10-CM

## 2023-03-08 DIAGNOSIS — J479 Bronchiectasis, uncomplicated: Secondary | ICD-10-CM | POA: Diagnosis not present

## 2023-03-08 NOTE — Progress Notes (Signed)
Assessment & Plan:   1. Bronchiectasis without complication (HCC) 2. Chronic Cough   Patient is a 77 year old with a history of bronchiectasis secondary to previously severe RLL pneumonia and empyema in 2016. Imaging studies show airway thickening with retained mucus and tree-in-bud in the RLL which I suspect due to retained secretions and in ability to expectorate, possibly resulting in a super imposed infection. His previous cultures from July of 2024 had grown candida, and repeat ordered in November of 2024 also only growing candida. He did have worsening symptoms during our last visit that we treated with a course of levofloxacin for bronchiectasis exacerbation as well as continued his pulmonary clearance regimen. Today, he feels significant improvement in shortness of breath and cough.  Will continue his current regimen of pulmonary clearance to help mobilize secretions (3% hypertonic saline, albuterol, flutter device, and incentive spirometer).  Did discuss red flag symptoms that could be concerning for a recurrent infection at which point I would consider bronchoscopy to obtain better cultures.   Return in about 5 months (around 08/06/2023).  I spent 31 minutes caring for this patient today, including preparing to see the patient, obtaining a medical history , reviewing a separately obtained history, performing a medically appropriate examination and/or evaluation, counseling and educating the patient/family/caregiver, ordering medications, tests, or procedures, and documenting clinical information in the electronic health record  [name] Chute, MD [facility]  End of visit medications:  No orders of the defined types were placed in this encounter.    Current Outpatient Medications:    acetaminophen (TYLENOL) 500 MG tablet, Take 500 mg by mouth every 6 (six) hours as needed for moderate pain., Disp: , Rfl:    albuterol (PROVENTIL) (2.5 MG/3ML) 0.083% nebulizer  solution, Take 3 mLs (2.5 mg total) by nebulization in the morning and at bedtime., Disp: 250 mL, Rfl: 11   aspirin EC 81 MG tablet, Take 1 tablet (81 mg total) by mouth daily., Disp: 90 tablet, Rfl: 3   Calcium Citrate-Vitamin D (CALCIUM CITRATE + D3 PO), Take 1 tablet by mouth daily., Disp: , Rfl:    Carboxymethylcellul-Glycerin (LUBRICATING EYE DROPS OP), Place 1 drop into both eyes 2 (two) times daily. As needed, Disp: , Rfl:    carvedilol (COREG) 6.25 MG tablet, Take 1 tablet (6.25 mg total) by mouth 2 (two) times daily with a meal., Disp: 180 tablet, Rfl: 1   Dextromethorphan-guaiFENesin (MUCUS RELIEF DM PO), Take by mouth. As needed., Disp: , Rfl:    FARXIGA 10 MG TABS tablet, TAKE 1 TABLET(10 MG) BY MOUTH DAILY, Disp: 90 tablet, Rfl: 3   Menthol, Topical Analgesic, (BIOFREEZE EX), Apply topically., Disp: , Rfl:    Multiple Vitamins-Minerals (SENIOR VITES PO), Take 1 tablet by mouth daily., Disp: , Rfl:    nitroGLYCERIN (NITROSTAT) 0.4 MG SL tablet, Place 1 tablet (0.4 mg total) under the tongue every 5 (five) minutes as needed., Disp: 25 tablet, Rfl: 3   rosuvastatin (CRESTOR) 40 MG tablet, Take 1 tablet (40 mg total) by mouth daily., Disp: 90 tablet, Rfl: 1   sertraline (ZOLOFT) 50 MG tablet, Take 1 tablet (50 mg total) by mouth daily., Disp: , Rfl:    sodium chloride HYPERTONIC 3 % nebulizer solution, Take by nebulization in the morning and at bedtime., Disp: 1500 mL, Rfl: 11   tamsulosin (FLOMAX) 0.4 MG CAPS capsule, Take 1 capsule (0.4 mg total) by mouth daily., Disp: 90 capsule, Rfl: 1   valsartan (DIOVAN) 80 MG tablet,  Take 80 mg by mouth daily., Disp: , Rfl:    Fluticasone-Umeclidin-Vilant (TRELEGY ELLIPTA) 100-62.5-25 MCG/ACT AEPB, Inhale 1 puff into the lungs daily. (Patient not taking: Reported on 03/08/2023), Disp: 14 each, Rfl:    Subjective:   PATIENT ID: [name] [name] GENDER: male DOB: 1945/10/30, MRN: [id number]  Chief Complaint  Patient presents with   Follow-up    No  cough, shortness of breath or wheezing.    HPI  Patient is a pleasant 77 year old male presenting for follow up today. I first met [name] [name] on 01/16/2023. He was previously followed by Dr. Sherene Sires at our [place] location and has transitioned his care over the our [place] office.  Symptoms are significantly improved today. I last met him in November of 2024 where he reported cough and increased shortness of breath. We prescribed a course of Levofloxacin for management of a bronchiectasis exacerbation, and continued his pulmonary clearance regimen (did clarify  dose of hypertonic saline at 3%). He feels much improved, with decrease in cough and shortness of breath. He is able to sing without having to cough. Shortness of breath is also improved. No fevers or chills reported, no chest pain.   During our previous visit, he'd reported a cough that was persistent. This cough was previously productive but had changed nature and became unproductive. Imaging studies, including multiple chest CT, had shown bronchiectasis in the RLL. He was not on inhalers at the time of our visit, reporting minimal improvement after trying them previously. He reported exacerbations in his breathing a few times a year with fever and increase in sputum production.     Patient reports that his respiratory symptoms all started in 2016 following a pulmonary infection that was complicated by empyema. Review of the medical record is notable for an admission in April of 2016 where he was seen for empyema and underwent flexible bronchoscopy in addition to VATS decortication of the visceral pleura. CT abdomen/pelvis March 2016 showed a RLL pneuomonia, while CT chest from April 7 of 2016 showed a loculated pleural effusion consistent with empyema. The following CT from April 20 of 2016 showed persistent collapse/atelectasis in the RLL. June 2016 imaging showed re-expansion of the RLL with post infectious fibrosis and scarring. A CT chest  from August of 2017 showed very mild bronchiolectasis with post surgical changes. Bronchiectasis begins to be evident in imaging from march of 2019, with associated tree-in-bud opacities in addition to the ectatic and dilated bronchi.   Patient previously worked as an Programmer, systems, Holiday representative in college. He is a former smoker, with around 10 pack years of smoking history, in addition to smoking pipe for over 40 years. He is currently retired.  Ancillary information including prior medications, full medical/surgical/family/social histories, and PFTs (when available) are listed below and have been reviewed.   Review of Systems  Constitutional:  Negative for chills, fever and weight loss.  Respiratory:  Positive for cough. Negative for hemoptysis, sputum production, shortness of breath and wheezing.   Cardiovascular:  Negative for chest pain.     Objective:   Vitals:   03/08/23 1014  BP: 106/60  Pulse: (!) 56  Temp: 97.9 F (36.6 C)  TempSrc: Temporal  SpO2: 97%  Weight: 187 lb 9.6 oz (85.1 kg)  Height: 6' (1.829 m)   97% on RA  BMI Readings from Last 3 Encounters:  03/08/23 25.44 kg/m  02/08/23 25.58 kg/m  02/07/23 25.43 kg/m   Wt Readings from Last 3 Encounters:  03/08/23  187 lb 9.6 oz (85.1 kg)  02/08/23 188 lb 9.6 oz (85.5 kg)  02/07/23 187 lb 8 oz (85 kg)    Physical Exam Constitutional:      Appearance: Normal appearance.  Cardiovascular:     Rate and Rhythm: Normal rate and regular rhythm.     Pulses: Normal pulses.     Heart sounds: Normal heart sounds.  Pulmonary:     Effort: Pulmonary effort is normal.     Breath sounds: Rales (RLL) present. No wheezing or rhonchi.  Musculoskeletal:     Right lower leg: No edema.     Left lower leg: No edema.  Neurological:     General: No focal deficit present.     Mental Status: He is alert and oriented to person, place, and time. Mental status is at baseline.       Ancillary Information     Past Medical History:  Diagnosis Date   Acute systolic heart failure (HCC)    Anxiety    COPD (chronic obstructive pulmonary disease) (HCC)    Enlarged prostate with lower urinary tract symptoms (LUTS)    GERD (gastroesophageal reflux disease)    Hyperlipidemia    Hypertension    Low serum testosterone level    PAF (paroxysmal atrial fibrillation) (HCC)    Pneumonia 05/2014   Pyothorax without fistula (HCC)    Sciatica    Shortness of breath dyspnea    Solitary pulmonary nodule    STEMI (ST elevation myocardial infarction) (HCC)    09/27/17 PCI/DES x1 mLAD, PTCA of the diag branch. EF 30% Lifevest at discharge.    Testicular hypofunction    Urinary frequency      Family History  Problem Relation Age of Onset   Tuberculosis Father    Hypertension Father    COPD Mother    Hypertension Mother    Colon cancer Neg Hx      Past Surgical History:  Procedure Laterality Date   APPENDECTOMY     BACK SURGERY     COLONOSCOPY  2011   Tubular adenoma per patient   COLONOSCOPY N/A 03/03/2015   Dr. Darrick Penna: Left colon is redundant, mild diverticulosis, small internal hemorrhoids, moderate external hemorrhoids.  Next colonoscopy in 5 years with overtube.   COLONOSCOPY N/A 03/25/2019   Procedure: COLONOSCOPY;  Surgeon: West Bali, MD;  [facility]: [facility];  Service: Endoscopy;  Laterality: N/A;  8:30am - Camille's request   CORONARY ULTRASOUND/IVUS N/A 09/27/2017   Procedure: Intravascular Ultrasound/IVUS;  Surgeon: Kathleene Hazel, MD;  Location: [facility];  Service: Cardiovascular;  Laterality: N/A;   CORONARY/GRAFT ACUTE MI REVASCULARIZATION N/A 09/27/2017   Procedure: Coronary/Graft Acute MI Revascularization;  Surgeon: Kathleene Hazel, MD;  Location: [facility];  Service: Cardiovascular;  Laterality: N/A;   CYSTOSCOPY W/ RETROGRADES Bilateral 06/16/2020   Procedure: CYSTOSCOPY WITH RETROGRADE PYELOGRAM;  Surgeon: Riki Altes, MD;   Location: [facility] OR[facility];  Service: Urology;  Laterality: Bilateral;   EMPYEMA DRAINAGE Right 06/27/2014   Procedure: EMPYEMA DRAINAGE;  Surgeon: Delight Ovens, MD;  Location: [facility] OR;  Service: Thoracic;  Laterality: Right;   HEMORRHOID BANDING N/A 03/03/2015   Procedure: Elbert Lions;  Surgeon: West Bali, MD;  [facility]: [facility];  Service: Endoscopy;  Laterality: N/A;  Recomended to see a Careers adviser.   HERNIA REPAIR     LEFT HEART CATH AND CORONARY ANGIOGRAPHY N/A 09/27/2017   Procedure: LEFT HEART CATH AND CORONARY ANGIOGRAPHY;  Surgeon: Clifton James,  Nile Dear, MD;  Location: [facility];  Service: Cardiovascular;  Laterality: N/A;   LUNG SURGERY  2016   POLYPECTOMY  03/25/2019   Procedure: POLYPECTOMY;  Surgeon: West Bali, MD;  [facility]: [facility];  Service: Endoscopy;;   TEE WITHOUT CARDIOVERSION N/A 07/14/2014   Procedure: TRANSESOPHAGEAL ECHOCARDIOGRAM (TEE);  Surgeon: Quintella Reichert, MD;  Location: [facility];  Service: Cardiovascular;  Laterality: N/A;   TRANSURETHRAL RESECTION OF BLADDER TUMOR N/A 06/16/2020   Procedure: TRANSURETHRAL RESECTION OF BLADDER TUMOR (TURBT) WITH GEMCITABINE;  Surgeon: Riki Altes, MD;  Location: [facility] ORS;  Service: Urology;  Laterality: N/A;   VARICOSE VEIN SURGERY     VIDEO ASSISTED THORACOSCOPY Right 06/27/2014   Procedure: VIDEO ASSISTED THORACOSCOPY;  Surgeon: Delight Ovens, MD;  Location: [facility] OR;  Service: Thoracic;  Laterality: Right;   VIDEO BRONCHOSCOPY N/A 06/27/2014   Procedure: VIDEO BRONCHOSCOPY;  Surgeon: Delight Ovens, MD;  Location: [facility] OR;  Service: Thoracic;  Laterality: N/A;    Social History   Socioeconomic History   Marital status: Married    Spouse name: Not on file   Number of children: Not on file   Years of education: Not on file   Highest education level: Not on file  Occupational History   Not on file  Tobacco Use   Smoking status: Former    Current packs/day: 0.00    Average packs/day: 1  pack/day for 10.0 years (10.0 ttl pk-yrs)    Types: Pipe, Cigarettes    Start date: 06/06/2004    Quit date: 06/07/2014    Years since quitting: 8.7    Passive exposure: Past   Smokeless tobacco: Never   Tobacco comments:    smoked pipe for 40 years, daily  Vaping Use   Vaping status: Never Used  Substance and Sexual Activity   Alcohol use: Not Currently    Comment: Average 1-2 drinks of beer in the evening; 04/29/20 none since 01/2020   Drug use: No   Sexual activity: Not on file  Other Topics Concern   Not on file  Social History Narrative   Not on file   Social Drivers of Health   Financial Resource Strain: Not on file  Food Insecurity: Unknown (05/24/2022)   [facility]   Hunger Vital Sign    Worried About Running Out of Food in the Last Year: Patient declined to answer    Ran Out of Food in the Last Year: Patient declined to answer  Transportation Needs: Not on file (05/24/2022)  Physical Activity: Not on file  Stress: Not on file  Social Connections: Not on file  Intimate Partner Violence: Not on file     No Known Allergies   CBC    Component Value Date/Time   WBC 10.1 01/30/2023 0804   RBC 4.62 01/30/2023 0804   HGB 14.9 01/30/2023 0804   HGB 15.1 01/10/2022 1049   HCT 44.7 01/30/2023 0804   HCT 45.9 01/10/2022 1049   PLT 202 01/30/2023 0804   PLT 239 01/10/2022 1049   MCV 96.8 01/30/2023 0804   MCV 96 01/10/2022 1049   MCH 32.3 01/30/2023 0804   MCHC 33.3 01/30/2023 0804   RDW 12.7 01/30/2023 0804   RDW 12.2 01/10/2022 1049   LYMPHSABS 1.3 01/10/2022 1049   MONOABS 1.2 (H) 12/13/2019 1006   EOSABS 131 01/30/2023 0804   EOSABS 0.2 01/10/2022 1049   BASOSABS 61 01/30/2023 0804   BASOSABS 0.1 01/10/2022  1049    Pulmonary Functions Testing Results:    Latest Ref Rng & Units 08/06/2019    1:47 PM  PFT Results  FVC-Pre L 3.80   FVC-Predicted Pre % 80   FVC-Post L 4.08   FVC-Predicted Post % 86   Pre FEV1/FVC % % 60    Post FEV1/FCV % % 64   FEV1-Pre L 2.26   FEV1-Predicted Pre % 66   FEV1-Post L 2.60   DLCO uncorrected ml/min/mmHg 18.09   DLCO UNC% % 66   DLCO corrected ml/min/mmHg 18.09   DLCO COR %Predicted % 66   DLVA Predicted % 79     Outpatient Medications Prior to Visit  Medication Sig Dispense Refill   acetaminophen (TYLENOL) 500 MG tablet Take 500 mg by mouth every 6 (six) hours as needed for moderate pain.     albuterol (PROVENTIL) (2.5 MG/3ML) 0.083% nebulizer solution Take 3 mLs (2.5 mg total) by nebulization in the morning and at bedtime. 250 mL 11   aspirin EC 81 MG tablet Take 1 tablet (81 mg total) by mouth daily. 90 tablet 3   Calcium Citrate-Vitamin D (CALCIUM CITRATE + D3 PO) Take 1 tablet by mouth daily.     Carboxymethylcellul-Glycerin (LUBRICATING EYE DROPS OP) Place 1 drop into both eyes 2 (two) times daily. As needed     carvedilol (COREG) 6.25 MG tablet Take 1 tablet (6.25 mg total) by mouth 2 (two) times daily with a meal. 180 tablet 1   Dextromethorphan-guaiFENesin (MUCUS RELIEF DM PO) Take by mouth. As needed.     FARXIGA 10 MG TABS tablet TAKE 1 TABLET(10 MG) BY MOUTH DAILY 90 tablet 3   Menthol, Topical Analgesic, (BIOFREEZE EX) Apply topically.     Multiple Vitamins-Minerals (SENIOR VITES PO) Take 1 tablet by mouth daily.     nitroGLYCERIN (NITROSTAT) 0.4 MG SL tablet Place 1 tablet (0.4 mg total) under the tongue every 5 (five) minutes as needed. 25 tablet 3   rosuvastatin (CRESTOR) 40 MG tablet Take 1 tablet (40 mg total) by mouth daily. 90 tablet 1   sertraline (ZOLOFT) 50 MG tablet Take 1 tablet (50 mg total) by mouth daily.     sodium chloride HYPERTONIC 3 % nebulizer solution Take by nebulization in the morning and at bedtime. 1500 mL 11   tamsulosin (FLOMAX) 0.4 MG CAPS capsule Take 1 capsule (0.4 mg total) by mouth daily. 90 capsule 1   valsartan (DIOVAN) 80 MG tablet Take 80 mg by mouth daily.     Fluticasone-Umeclidin-Vilant (TRELEGY ELLIPTA) 100-62.5-25  MCG/ACT AEPB Inhale 1 puff into the lungs daily. (Patient not taking: Reported on 03/08/2023) 14 each    cyclobenzaprine (FLEXERIL) 5 MG tablet Take 5 mg by mouth at bedtime. As needed for lower back pain. (Patient not taking: Reported on 02/08/2023)     levofloxacin (LEVAQUIN) 750 MG tablet Take 1 tablet (750 mg total) by mouth daily. 10 tablet 0   No facility-administered medications prior to visit.

## 2023-03-11 LAB — ACID FAST CULTURE WITH REFLEXED SENSITIVITIES (MYCOBACTERIA): Acid Fast Culture: NEGATIVE

## 2023-03-12 LAB — ACID FAST CULTURE WITH REFLEXED SENSITIVITIES (MYCOBACTERIA): Acid Fast Culture: NEGATIVE

## 2023-03-27 ENCOUNTER — Other Ambulatory Visit: Payer: Self-pay

## 2023-03-27 ENCOUNTER — Encounter: Payer: Self-pay | Admitting: Student in an Organized Health Care Education/Training Program

## 2023-03-27 ENCOUNTER — Encounter: Payer: Self-pay | Admitting: Cardiology

## 2023-03-27 MED ORDER — VALSARTAN 80 MG PO TABS: 80.0000 mg | ORAL_TABLET | Freq: Every day | ORAL | 3 refills | Status: DC

## 2023-03-27 NOTE — Telephone Encounter (Signed)
 Prior [employer], please advise.

## 2023-03-28 ENCOUNTER — Other Ambulatory Visit (HOSPITAL_COMMUNITY): Payer: Self-pay

## 2023-03-28 NOTE — Telephone Encounter (Signed)
 Test claim shows that these medications must be processed through patient's Part B. Our team is currently unable to process these types of claims and the filling pharmacy will need to contact the number on rejection from insurance, or number on the back of the patient's card. Thanks!

## 2023-03-29 DIAGNOSIS — H04123 Dry eye syndrome of bilateral lacrimal glands: Secondary | ICD-10-CM | POA: Diagnosis not present

## 2023-03-31 NOTE — Telephone Encounter (Signed)
 I spoke with the patient. I told him our pharmacy team said the medications need to be filed under Medicare Part B. He will tell the pharmacy that when he goes to get a refill in a month. He will call back If he has any problems.  Nothing further needed.

## 2023-04-07 ENCOUNTER — Other Ambulatory Visit: Payer: Self-pay | Admitting: Cardiology

## 2023-04-18 ENCOUNTER — Ambulatory Visit: Payer: Medicare Other | Admitting: Student in an Organized Health Care Education/Training Program

## 2023-04-28 ENCOUNTER — Encounter: Payer: Self-pay | Admitting: Nurse Practitioner

## 2023-04-28 ENCOUNTER — Other Ambulatory Visit: Payer: Self-pay

## 2023-04-28 MED ORDER — SERTRALINE HCL 50 MG PO TABS: 50.0000 mg | ORAL_TABLET | Freq: Every day | ORAL | Status: DC

## 2023-04-28 NOTE — Telephone Encounter (Signed)
 Patient is requesting a refill of the following medications: Requested Prescriptions   Pending Prescriptions Disp Refills   sertraline  (ZOLOFT ) 50 MG tablet      Sig: Take 1 tablet (50 mg total) by mouth daily.    Date of last refill:02/07/2023

## 2023-05-05 ENCOUNTER — Other Ambulatory Visit: Payer: Self-pay | Admitting: Student

## 2023-05-17 ENCOUNTER — Ambulatory Visit: Payer: Medicare Other | Admitting: Student

## 2023-05-17 ENCOUNTER — Encounter: Payer: Self-pay | Admitting: Student

## 2023-05-17 VITALS — BP 122/64 | HR 61 | Temp 98.1°F | Ht 72.0 in | Wt 198.0 lb

## 2023-05-17 DIAGNOSIS — J449 Chronic obstructive pulmonary disease, unspecified: Secondary | ICD-10-CM

## 2023-05-17 DIAGNOSIS — I4891 Unspecified atrial fibrillation: Secondary | ICD-10-CM | POA: Diagnosis not present

## 2023-05-17 DIAGNOSIS — I10 Essential (primary) hypertension: Secondary | ICD-10-CM | POA: Diagnosis not present

## 2023-05-17 DIAGNOSIS — G4700 Insomnia, unspecified: Secondary | ICD-10-CM

## 2023-05-17 DIAGNOSIS — N401 Enlarged prostate with lower urinary tract symptoms: Secondary | ICD-10-CM | POA: Diagnosis not present

## 2023-05-17 DIAGNOSIS — J479 Bronchiectasis, uncomplicated: Secondary | ICD-10-CM | POA: Diagnosis not present

## 2023-05-17 DIAGNOSIS — N1831 Chronic kidney disease, stage 3a: Secondary | ICD-10-CM | POA: Diagnosis not present

## 2023-05-17 NOTE — Patient Instructions (Addendum)
 Decrease dose of sertraline to 25 mg daily for 2-3 weeks. If you want to continue the taper longer, you can take a tablet every other day for an additional 2 weeks.   VISIT SUMMARY:  Today, we reviewed your ongoing health concerns and made some adjustments to your treatment plan. We discussed your hypertension, chronic kidney disease, depression, chronic cough, and nocturia. We also talked about general health maintenance, including alcohol consumption.  YOUR PLAN:  -HYPERTENSION: Hypertension, or high blood pressure, is being managed with losartan. We need to monitor your kidney function and potassium levels closely due to potential side effects. We will order labs to check these levels and may consider changing your medication if necessary.  -CHRONIC KIDNEY DISEASE: Chronic kidney disease means your kidneys are not working as well as they should. We noted mildly elevated creatinine and reduced eGFR levels. Regular monitoring is important to prevent further decline. We will order labs to check your kidney function and potassium levels and monitor them quarterly or at least twice a year.  -DEPRESSION: Your depression is well-managed with sertraline. We discussed tapering off the medication due to your stability over the past six months. The plan is to reduce the dose to 25 mg daily for two weeks, then stop if stable. If not ready to stop, consider tapering to every other day. Limit alcohol intake to 2-3 drinks per week and avoid alcohol until the end of the taper.  -CHRONIC COUGH: Your chronic cough has significantly improved with nebulizer treatments and recent antibiotic therapy. Continue with the current nebulizer treatments twice daily.  -NOCTURIA: Nocturia means waking up frequently at night to urinate. We discussed the potential link to sleep apnea, but you do not believe a sleep study is necessary. Consider a sleep study if symptoms persist or worsen.  -GENERAL HEALTH MAINTENANCE: We  discussed the impact of alcohol on sleep, memory, and balance. It is recommended to limit alcohol intake to 2-3 drinks per week.  INSTRUCTIONS:  Please follow up with your cardiologist next month. We will order labs to check your kidney function and potassium levels.

## 2023-05-17 NOTE — Progress Notes (Unsigned)
 [facility]: [facility] Provider: Sydnee Cabal  Code Status: Full Code Goals of Care:     02/07/2023    8:22 AM  Advanced Directives  Does Patient Have a Medical Advance Directive? Yes  Type of Estate agent of [place];Living will;Out of facility DNR (pink MOST or yellow form)  Does patient want to make changes to medical advance directive? No - Patient declined  Copy of Healthcare Power of Attorney in Chart? Yes - validated most recent copy scanned in chart (See row information)  Pre-existing out of facility DNR order (yellow form or pink MOST form) Yellow form placed in chart (order not valid for inpatient use)     Chief Complaint  Patient presents with   Medical Management of Chronic Issues    Medical Management of Chronic Issues. 3 Month follow up    HPI: Patient is a 78 y.o. male seen today for medical management of chronic diseases.   Discussed the use of AI scribe software for clinical note transcription with the patient, who gave verbal consent to proceed.  History of Present Illness   [name] [name] "[name] [name]" is a 78 year old male who presents for a routine follow-up.  He has been managing depression with sertraline, previously adjusted from 75 mg to 50 mg daily, which has been effective without major issues. No sleeping issues currently, although he experiences snoring, which he attributes to nebulizer treatments prescribed by his pulmonologist.  He has a history of pulmonary issues and uses nebulizer treatments twice daily. These were initiated after a pulmonologist visit due to severe coughing that disrupted sleep. He was treated with antibiotics for an infection, which significantly improved his symptoms. The coughing has since improved, and the nebulizer treatments are effective.  He has a history of heart issues, including a heart attack in 2019, and is currently on valsartan for blood pressure management. He switched from  lisinopril to losartan due to a chronic cough. His kidney function has been slightly abnormal, with a creatinine level of 1.43 and an eGFR of 50 as of November 11th. His A1c was slightly elevated at 5.9.  He wakes up every three hours to urinate, which he does not believe is related to sleep apnea. He has noticed increased snoring since starting nebulizer treatments but does not find it disruptive to his sleep.  He is actively involved in political activities, such as [employer].       Past Medical History:  Diagnosis Date   Acute systolic heart failure (HCC)    Anxiety    COPD (chronic obstructive pulmonary disease) (HCC)    Enlarged prostate with lower urinary tract symptoms (LUTS)    GERD (gastroesophageal reflux disease)    Hyperlipidemia    Hypertension    Low serum testosterone level    PAF (paroxysmal atrial fibrillation) (HCC)    Pneumonia 05/2014   Pyothorax without fistula (HCC)    Sciatica    Shortness of breath dyspnea    Solitary pulmonary nodule    STEMI (ST elevation myocardial infarction) (HCC)    09/27/17 PCI/DES x1 mLAD, PTCA of the diag branch. EF 30% Lifevest at discharge.    Testicular hypofunction    Urinary frequency     Past Surgical History:  Procedure Laterality Date   APPENDECTOMY     BACK SURGERY     COLONOSCOPY  2011   Tubular adenoma per patient   COLONOSCOPY N/A 03/03/2015   Dr.  Fields: Left colon is redundant, mild diverticulosis, small internal hemorrhoids, moderate external hemorrhoids.  Next colonoscopy in 5 years with overtube.   COLONOSCOPY N/A 03/25/2019   Procedure: COLONOSCOPY;  Surgeon: West Bali, MD;  Location: [facility];  Service: Endoscopy;  Laterality: N/A;  8:30am - Camille's request   CORONARY ULTRASOUND/IVUS N/A 09/27/2017   Procedure: Intravascular Ultrasound/IVUS;  Surgeon: Kathleene Hazel, MD;  Location: [facility];  Service: Cardiovascular;  Laterality: N/A;    CORONARY/GRAFT ACUTE MI REVASCULARIZATION N/A 09/27/2017   Procedure: Coronary/Graft Acute MI Revascularization;  Surgeon: Kathleene Hazel, MD;  Location: [facility];  Service: Cardiovascular;  Laterality: N/A;   CYSTOSCOPY W/ RETROGRADES Bilateral 06/16/2020   Procedure: CYSTOSCOPY WITH RETROGRADE PYELOGRAM;  Surgeon: Riki Altes, MD;  Location: [facility];  Service: Urology;  Laterality: Bilateral;   EMPYEMA DRAINAGE Right 06/27/2014   Procedure: EMPYEMA DRAINAGE;  Surgeon: Delight Ovens, MD;  Location: [facility] OR;  Service: Thoracic;  Laterality: Right;   HEMORRHOID BANDING N/A 03/03/2015   Procedure: [place];  Surgeon: West Bali, MD;  Location: [facility];  Service: Endoscopy;  Laterality: N/A;  Recomended to see a Careers adviser.   HERNIA REPAIR     LEFT HEART CATH AND CORONARY ANGIOGRAPHY N/A 09/27/2017   Procedure: LEFT HEART CATH AND CORONARY ANGIOGRAPHY;  Surgeon: Kathleene Hazel, MD;  [facility]: [facility];  Service: Cardiovascular;  Laterality: N/A;   LUNG SURGERY  2016   POLYPECTOMY  03/25/2019   Procedure: POLYPECTOMY;  Surgeon: West Bali, MD;  [facility]: [facility];  Service: Endoscopy;;   TEE WITHOUT CARDIOVERSION N/A 07/14/2014   Procedure: TRANSESOPHAGEAL ECHOCARDIOGRAM (TEE);  Surgeon: Quintella Reichert, MD;  Location[facility] END[facility];  Service: Cardiovascular;  Laterality: N/A;   TRANSURETHRAL RESECTION OF BLADDER TUMOR N/A 06/16/2020   Procedure: TRANSURETHRAL RESECTION OF BLADDER TUMOR (TURBT) WITH GEMCITABINE;  Surgeon: Riki Altes, MD;  Location: [facility];  Service: Urology;  Laterality: N/A;   VARICOSE VEIN SURGERY     VIDEO ASSISTED THORACOSCOPY Right 06/27/2014   Procedure: VIDEO ASSISTED THORACOSCOPY;  Surgeon: Delight Ovens, MD;  Location: [facility] OR;  Service: Thoracic;  Laterality: Right;   VIDEO BRONCHOSCOPY N/A 06/27/2014   Procedure: VIDEO BRONCHOSCOPY;  Surgeon: Delight Ovens, MD;  Location: [facility] OR;  Service: Thoracic;   Laterality: N/A;    No Known Allergies  Outpatient Encounter Medications as of 05/17/2023  Medication Sig   acetaminophen (TYLENOL) 500 MG tablet Take 500 mg by mouth every 6 (six) hours as needed for moderate pain.   albuterol (PROVENTIL) (2.5 MG/3ML) 0.083% nebulizer solution Take 3 mLs (2.5 mg total) by nebulization in the morning and at bedtime.   aspirin EC 81 MG tablet Take 1 tablet (81 mg total) by mouth daily.   Calcium Citrate-Vitamin D (CALCIUM CITRATE + D3 PO) Take 1 tablet by mouth daily.   Carboxymethylcellul-Glycerin (LUBRICATING EYE DROPS OP) Place 1 drop into both eyes 2 (two) times daily. As needed   carvedilol (COREG) 6.25 MG tablet Take 1 tablet (6.25 mg total) by mouth 2 (two) times daily with a meal.   FARXIGA 10 MG TABS tablet TAKE 1 TABLET(10 MG) BY MOUTH DAILY   Menthol, Topical Analgesic, (BIOFREEZE EX) Apply topically.   Multiple Vitamins-Minerals (SENIOR VITES PO) Take 1 tablet by mouth daily.   nitroGLYCERIN (NITROSTAT) 0.4 MG SL tablet Place 1 tablet (0.4 mg total) under the tongue every 5 (five) minutes as needed.  rosuvastatin (CRESTOR) 40 MG tablet Take 1 tablet (40 mg total) by mouth daily.   sertraline (ZOLOFT) 50 MG tablet TAKE 1 TABLET BY MOUTH DAILY   sodium chloride HYPERTONIC 3 % nebulizer solution Take by nebulization in the morning and at bedtime.   tamsulosin (FLOMAX) 0.4 MG CAPS capsule Take 1 capsule (0.4 mg total) by mouth daily.   traZODone (DESYREL) 50 MG tablet Take 50 mg by mouth at bedtime as needed for sleep.   valsartan (DIOVAN) 80 MG tablet Take 1 tablet (80 mg total) by mouth daily.   Dextromethorphan-guaiFENesin (MUCUS RELIEF DM PO) Take by mouth. As needed. (Patient not taking: Reported on 05/17/2023)   No facility-administered encounter medications on file as of 05/17/2023.    Review of Systems:  Review of Systems  Health Maintenance  Topic Date Due   COVID-19 Vaccine (11 - 2024-25 season) 02/07/2023   Medicare Annual Wellness  (AWV)  03/10/2023   DTaP/Tdap/Td (3 - Td or Tdap) 01/17/2033   Pneumonia Vaccine 1+ Years old  Completed   INFLUENZA VACCINE  Completed   Hepatitis C Screening  Completed   Zoster Vaccines- Shingrix  Completed   HPV VACCINES  Aged Out   Colonoscopy  Discontinued    Physical Exam: Vitals:   05/17/23 1301  BP: 122/64  Pulse: 61  Temp: 98.1 F (36.7 C)  SpO2: 98%  Weight: 198 lb (89.8 kg)  Height: 6' (1.829 m)   Body mass index is 26.85 kg/m. Physical Exam Physical Exam   General: Well-appearing, well-groomed, NAD CV: normal rate PULM: LCTAB NEURO: A&Ox4      Labs reviewed: Basic Metabolic Panel: Recent Labs    08/12/22 0000 01/30/23 0804  NA  --  139  K  --  5.3  CL  --  101  CO2  --  33*  GLUCOSE  --  115*  BUN 19 26*  CREATININE 1.3 1.43*  CALCIUM  --  9.1  MG  --  2.3   Liver Function Tests: Recent Labs    01/30/23 0804  AST 18  ALT 21  BILITOT 0.6  PROT 7.5   No results for input(s): "LIPASE", "AMYLASE" in the last 8760 hours. No results for input(s): "AMMONIA" in the last 8760 hours. CBC: Recent Labs    08/12/22 0000 01/30/23 0804  WBC 11.5 10.1  NEUTROABS  --  7,595  HGB 14.6 14.9  HCT 45 44.7  MCV  --  96.8  PLT  --  202   Lipid Panel: Recent Labs    08/12/22 0000 01/30/23 0804  CHOL 95 110  HDL 47 50  LDLCALC 15 46  TRIG 69 63  CHOLHDL  --  2.2   Lab Results  Component Value Date   HGBA1C 5.9 (H) 01/30/2023    Procedures since last visit: No results found. Results   LABS Creatinine: 1.43 mg/dL (78/29/5621) eGFR: 50 HY/QMV/7.84O (01/30/2023) A1c: 5.9% (01/30/2023) Potassium: 5.3 mmol/L (01/30/2023)      Assessment/Plan    Hypertension Hypertension managed with losartan. Previous use of lisinopril discontinued due to chronic cough. Concern about kidney function and elevated potassium levels potentially related to losartan. Need to monitor kidney function and potassium levels closely and consider alternative  medications if necessary. - Order labs to check kidney function and potassium levels - Consider changing antihypertensive medication if potassium levels remain elevated  Chronic Kidney Disease Mildly elevated creatinine (1.43) and reduced eGFR (50) noted in November. Monitoring kidney function due to potential impact from hypertension  medications. Importance of regular monitoring to prevent further decline in kidney function discussed. - Order labs to check kidney function and potassium levels - Monitor kidney function quarterly or at least twice a year  Depression Well-managed on sertraline 50 mg daily. Considering tapering off sertraline due to stability over the past six months. Conservative tapering approach discussed: reduce to 25 mg for two weeks, then stop. Patient expressed concerns about political stress but is engaging in proactive stress management. Advised to limit alcohol intake during tapering due to potential impact on sleep and overall health. - Reduce sertraline to 25 mg daily for two weeks, then stop if stable - Consider tapering to every other day if not ready to stop after three weeks - Limit alcohol intake to 2-3 drinks per week - Avoid alcohol until the end of sertraline taper  Chronic Cough Cough significantly improved with current nebulizer treatments and recent antibiotic therapy. No current issues affecting sleep. Increased snoring noted, possibly related to nebulizer use. - Continue current nebulizer treatments twice daily  Nocturia Waking up every three hours to urinate. Potential link to sleep apnea discussed. Patient does not believe sleep study is necessary. Explained that treating sleep apnea can sometimes reduce nocturia. - Consider sleep study if symptoms persist or worsen  General Health Maintenance Discussed alcohol consumption and its impact on sleep, memory, and balance. Recommended limiting alcohol intake to 2-3 drinks per week. - Limit alcohol intake  to 2-3 drinks per week  Follow-up - Order labs for kidney function and potassium levels - Follow up with cardiologist next month - Schedule blood test in May if labs are stable.       Labs/tests ordered:  * No order type specified * Next appt:  06/01/2023

## 2023-05-18 ENCOUNTER — Encounter: Payer: Self-pay | Admitting: Student

## 2023-05-18 DIAGNOSIS — N1831 Chronic kidney disease, stage 3a: Secondary | ICD-10-CM | POA: Diagnosis not present

## 2023-05-19 ENCOUNTER — Encounter: Payer: Self-pay | Admitting: Student

## 2023-05-19 LAB — MICROALBUMIN / CREATININE URINE RATIO
Creatinine, Urine: 76 mg/dL (ref 20–320)
Microalb Creat Ratio: 68 mg/g{creat} — ABNORMAL HIGH (ref <30)
Microalb, Ur: 5.2 mg/dL

## 2023-05-19 LAB — BASIC METABOLIC PANEL WITH GFR
BUN/Creatinine Ratio: 18 (calc) (ref 6–22)
BUN: 25 mg/dL (ref 7–25)
CO2: 31 mmol/L (ref 20–32)
Calcium: 9.2 mg/dL (ref 8.6–10.3)
Chloride: 103 mmol/L (ref 98–110)
Creat: 1.39 mg/dL — ABNORMAL HIGH (ref 0.70–1.28)
Glucose, Bld: 83 mg/dL (ref 65–99)
Potassium: 4.6 mmol/L (ref 3.5–5.3)
Sodium: 142 mmol/L (ref 135–146)
eGFR: 52 mL/min/{1.73_m2} — ABNORMAL LOW (ref >=60)

## 2023-06-01 ENCOUNTER — Ambulatory Visit (INDEPENDENT_AMBULATORY_CARE_PROVIDER_SITE_OTHER): Payer: Medicare Other | Admitting: Nurse Practitioner

## 2023-06-01 ENCOUNTER — Encounter: Payer: Self-pay | Admitting: Nurse Practitioner

## 2023-06-01 VITALS — BP 124/68 | HR 71 | Temp 97.6°F | Ht 72.0 in | Wt 195.0 lb

## 2023-06-01 DIAGNOSIS — Z0000 Encounter for general adult medical examination without abnormal findings: Secondary | ICD-10-CM | POA: Diagnosis not present

## 2023-06-01 NOTE — Progress Notes (Signed)
 Subjective:   [name] [name] is a 78 y.o. male who presents for Medicare Annual/Subsequent preventive examination.  Visit Complete: In person at [facility]    Cardiac Risk Factors include: hypertension;male gender;advanced age (>52men, >21 women);dyslipidemia;family history of premature cardiovascular disease     Objective:    Today's Vitals   06/01/23 0927  BP: 124/68  Pulse: 71  Temp: 97.6 F (36.4 C)  SpO2: 99%  Weight: 195 lb (88.5 kg)  Height: 6' (1.829 m)   Body mass index is 26.45 kg/m.     06/01/2023    9:28 AM 02/07/2023    8:22 AM 12/07/2022    2:18 PM 10/05/2022   12:38 PM 06/16/2020    8:11 AM 06/11/2020    2:58 PM 03/25/2019    7:44 AM  Advanced Directives  Does Patient Have a Medical Advance Directive? Yes Yes Yes Yes Yes Yes Yes  Type of Estate agent of [place];Out of facility DNR (pink MOST or yellow form);Living will Healthcare Power of [place];Living will;Out of facility DNR (pink MOST or yellow form) Healthcare Power of [place];Out of facility DNR (pink MOST or yellow form);Living will Healthcare Power of [place];Living will;Out of facility DNR (pink MOST or yellow form) Healthcare Power of [place];Living will Living will Healthcare Power of [place];Living will  Does patient want to make changes to medical advance directive? No - Patient declined No - Patient declined No - Patient declined No - Patient declined No - Patient declined    Copy of Healthcare Power of Attorney in Chart? No - copy requested Yes - validated most recent copy scanned in chart (See row information) Yes - validated most recent copy scanned in chart (See row information) Yes - validated most recent copy scanned in chart (See row information) No - copy requested  No - copy requested  Pre-existing out of facility DNR order (yellow form or pink MOST form)  Yellow form placed in chart (order not valid for inpatient use)         Current Medications  (verified) Outpatient Encounter Medications as of 06/01/2023  Medication Sig   acetaminophen (TYLENOL) 500 MG tablet Take 500 mg by mouth every 6 (six) hours as needed for moderate pain.   albuterol (PROVENTIL) (2.5 MG/3ML) 0.083% nebulizer solution Take 3 mLs (2.5 mg total) by nebulization in the morning and at bedtime.   aspirin EC 81 MG tablet Take 1 tablet (81 mg total) by mouth daily.   Calcium Citrate-Vitamin D (CALCIUM CITRATE + D3 PO) Take 1 tablet by mouth daily.   Carboxymethylcellul-Glycerin (LUBRICATING EYE DROPS OP) Place 1 drop into both eyes 2 (two) times daily. As needed   carvedilol (COREG) 6.25 MG tablet Take 1 tablet (6.25 mg total) by mouth 2 (two) times daily with a meal.   FARXIGA 10 MG TABS tablet TAKE 1 TABLET(10 MG) BY MOUTH DAILY   Menthol, Topical Analgesic, (BIOFREEZE EX) Apply topically.   Multiple Vitamins-Minerals (SENIOR VITES PO) Take 1 tablet by mouth daily.   nitroGLYCERIN (NITROSTAT) 0.4 MG SL tablet Place 1 tablet (0.4 mg total) under the tongue every 5 (five) minutes as needed.   rosuvastatin (CRESTOR) 40 MG tablet Take 1 tablet (40 mg total) by mouth daily.   sertraline (ZOLOFT) 50 MG tablet TAKE 1 TABLET BY MOUTH DAILY   sodium chloride HYPERTONIC 3 % nebulizer solution Take by nebulization in the morning and at bedtime.   tamsulosin (FLOMAX) 0.4 MG CAPS capsule Take 1 capsule (0.4 mg total) by  mouth daily.   traZODone (DESYREL) 50 MG tablet Take 50 mg by mouth at bedtime as needed for sleep.   valsartan (DIOVAN) 80 MG tablet Take 1 tablet (80 mg total) by mouth daily.   No facility-administered encounter medications on file as of 06/01/2023.    Allergies (verified) Patient has no known allergies.   History: Past Medical History:  Diagnosis Date   Acute systolic heart failure (HCC)    Anxiety    COPD (chronic obstructive pulmonary disease) (HCC)    Enlarged prostate with lower urinary tract symptoms (LUTS)    GERD (gastroesophageal reflux  disease)    Hyperlipidemia    Hypertension    Low serum testosterone level    PAF (paroxysmal atrial fibrillation) (HCC)    Pneumonia 05/2014   Pyothorax without fistula (HCC)    Sciatica    Shortness of breath dyspnea    Solitary pulmonary nodule    STEMI (ST elevation myocardial infarction) (HCC)    09/27/17 PCI/DES x1 mLAD, PTCA of the diag branch. EF 30% Lifevest at discharge.    Testicular hypofunction    Urinary frequency    Past Surgical History:  Procedure Laterality Date   APPENDECTOMY     BACK SURGERY     COLONOSCOPY  2011   Tubular adenoma per patient   COLONOSCOPY N/A 03/03/2015   Dr. Darrick Penna: Left colon is redundant, mild diverticulosis, small internal hemorrhoids, moderate external hemorrhoids.  Next colonoscopy in 5 years with overtube.   COLONOSCOPY N/A 03/25/2019   Procedure: COLONOSCOPY;  Surgeon: West Bali, MD;  [facility]: [facility];  Service: Endoscopy;  Laterality: N/A;  8:30am - Camille's request   CORONARY ULTRASOUND/IVUS N/A 09/27/2017   Procedure: Intravascular Ultrasound/IVUS;  Surgeon: Kathleene Hazel, MD;  Location: [facility];  Service: Cardiovascular;  Laterality: N/A;   CORONARY/GRAFT ACUTE MI REVASCULARIZATION N/A 09/27/2017   Procedure: Coronary/Graft Acute MI Revascularization;  Surgeon: Kathleene Hazel, MD;  Location: [facility];  Service: Cardiovascular;  Laterality: N/A;   CYSTOSCOPY W/ RETROGRADES Bilateral 06/16/2020   Procedure: CYSTOSCOPY WITH RETROGRADE PYELOGRAM;  Surgeon: Riki Altes, MD;  Location: [facility] ORS;  Service: Urology;  Laterality: Bilateral;   EMPYEMA DRAINAGE Right 06/27/2014   Procedure: EMPYEMA DRAINAGE;  Surgeon: Delight Ovens, MD;  Location: [facility] OR;  Service: Thoracic;  Laterality: Right;   HEMORRHOID BANDING N/A 03/03/2015   Procedure: [place];  Surgeon: West Bali, MD;  [facility]: [facility];  Service: Endoscopy;  Laterality: N/A;  Recomended to see a Careers adviser.    HERNIA REPAIR     LEFT HEART CATH AND CORONARY ANGIOGRAPHY N/A 09/27/2017   Procedure: LEFT HEART CATH AND CORONARY ANGIOGRAPHY;  Surgeon: Kathleene Hazel, MD;  [facility];  Service: Cardiovascular;  Laterality: N/A;   LUNG SURGERY  2016   POLYPECTOMY  03/25/2019   Procedure: POLYPECTOMY;  Surgeon: West Bali, MD;  [facility];  Service: Endoscopy;;   TEE WITHOUT CARDIOVERSION N/A 07/14/2014   Procedure: TRANSESOPHAGEAL ECHOCARDIOGRAM (TEE);  Surgeon: Quintella Reichert, MD;  Location: [facility] ENDOSCOPY;  Service: Cardiovascular;  Laterality: N/A;   TRANSURETHRAL RESECTION OF BLADDER TUMOR N/A 06/16/2020   Procedure: TRANSURETHRAL RESECTION OF BLADDER TUMOR (TURBT) WITH GEMCITABINE;  Surgeon: Riki Altes, MD;  Location: [facility] ORS;  Service: Urology;  Laterality: N/A;   VARICOSE VEIN SURGERY     VIDEO ASSISTED THORACOSCOPY Right 06/27/2014   Procedure: VIDEO ASSISTED THORACOSCOPY;  Surgeon: Delight Ovens,  MD;  Location: [facility] OR;  Service: Thoracic;  Laterality: Right;   VIDEO BRONCHOSCOPY N/A 06/27/2014   Procedure: VIDEO BRONCHOSCOPY;  Surgeon: Delight Ovens, MD;  Location[facility];  Service: Thoracic;  Laterality: N/A;   Family History  Problem Relation Age of Onset   Tuberculosis Father    Hypertension Father    COPD Mother    Hypertension Mother    Colon cancer Neg Hx    Social History   Socioeconomic History   Marital status: Married    Spouse name: Not on file   Number of children: Not on file   Years of education: Not on file   Highest education level: Not on file  Occupational History   Not on file  Tobacco Use   Smoking status: Former    Current packs/day: 0.00    Average packs/day: 1 pack/day for 10.0 years (10.0 ttl pk-yrs)    Types: Pipe, Cigarettes    Start date: 06/06/2004    Quit date: 06/07/2014    Years since quitting: 8.9    Passive exposure: Past   Smokeless tobacco: Never   Tobacco comments:    smoked pipe for 40 years,  daily  Vaping Use   Vaping status: Never Used  Substance and Sexual Activity   Alcohol use: Not Currently    Comment: Average 1-2 drinks of beer in the evening; 04/29/20 none since 01/2020   Drug use: No   Sexual activity: Not on file  Other Topics Concern   Not on file  Social History Narrative   Not on file   Social Drivers of Health   Financial Resource Strain: Not on file  Food Insecurity: No Food Insecurity (06/01/2023)   Hunger Vital Sign    Worried About Running Out of Food in the Last Year: Never true    Ran Out of Food in the Last Year: Never true  Transportation Needs: No Transportation Needs (06/01/2023)   PRAPARE - Administrator, Civil Service (Medical): No    Lack of Transportation (Non-Medical): No  Physical Activity: Not on file  Stress: Not on file  Social Connections: Not on file    Tobacco Counseling Counseling given: Not Answered Tobacco comments: smoked pipe for 40 years, daily   Clinical Intake:  Pre-visit preparation completed: Yes  Pain : No/denies pain     BMI - recorded: 26 Nutritional Status: BMI 25 -29 Overweight Nutritional Risks: None Diabetes: No  How often do you need to have someone help you when you read instructions, pamphlets, or other written materials from your doctor or pharmacy?: 1 - Never         Activities of Daily Living    06/01/2023    9:29 AM  In your present state of health, do you have any difficulty performing the following activities:  Hearing? 1  Vision? 0  Difficulty concentrating or making decisions? 1  Walking or climbing stairs? 0  Dressing or bathing? 0  Doing errands, shopping? 0  Preparing Food and eating ? N  Using the Toilet? N  In the past six months, have you accidently leaked urine? Y  Do you have problems with loss of bowel control? N  Managing your Medications? N  Managing your Finances? N  Housekeeping or managing your Housekeeping? N    Patient Care Team: Earnestine Mealing, MD as PCP - General (Family Medicine) Wyline Mood Dorothe Pea, MD as PCP - Cardiology (Cardiology) Comer, Belia Heman, MD as Consulting Physician (Infectious Diseases)  Lanelle Bal, DO as Consulting Physician (Internal Medicine) Raechel Chute, MD as Consulting Physician (Pulmonary Disease)  Indicate any recent Medical Services you may have received from other than [facility] providers in the past year (date may be approximate).     Assessment:   This is a routine wellness examination for Jaxn.  Hearing/Vision screen Vision Screening - Comments:: Dr. Charise Killian with [facility] in [place] Last Exam: 03/2023   Goals Addressed               This Visit's Progress     "We want to stay out of the hospital and get better!" (pt-stated)   On track      Depression Screen    06/01/2023    9:33 AM 05/17/2023    1:06 PM 05/17/2023    1:05 PM 02/07/2023    8:21 AM 10/05/2022   12:48 PM 04/02/2019    3:03 PM 03/01/2018    8:03 AM  PHQ 2/9 Scores  PHQ - 2 Score 0 1 0 0 2 1 0  PHQ- 9 Score  2   4  2     Fall Risk    06/01/2023    9:33 AM 05/17/2023    1:05 PM 02/07/2023    8:21 AM 10/05/2022   12:48 PM 04/02/2019    3:03 PM  Fall Risk   Falls in the past year? 0 0 1 1 0  Number falls in past yr: 0 0 0 0 0  Injury with Fall? 0 0 0 0 0  Risk for fall due to : No Fall Risks No Fall Risks     Follow up Falls evaluation completed Falls evaluation completed Falls evaluation completed  Falls evaluation completed    MEDICARE RISK AT HOME: Medicare Risk at Home Any stairs in or around the home?: Yes If so, are there any without handrails?: Yes Home free of loose throw rugs in walkways, pet beds, electrical cords, etc?: No Adequate lighting in your home to reduce risk of falls?: Yes Life alert?: No Use of a cane, walker or w/c?: No Grab bars in the bathroom?: Yes Shower chair or bench in shower?: No Elevated toilet seat or a handicapped toilet?: No  TIMED UP AND GO:  Was the test  performed?  No    Cognitive Function:        06/01/2023    9:35 AM  6CIT Screen  What Year? 0 points  What month? 0 points  What time? 0 points  Count back from 20 0 points  Months in reverse 0 points  Repeat phrase 0 points  Total Score 0 points    Immunizations Immunization History  Administered Date(s) Administered   Fluad Quad(high Dose 65+) 11/09/2018, 10/19/2020   Influenza Split 01/19/2014, 11/13/2014, 12/18/2017   Influenza, High Dose Seasonal PF 11/08/2016   Influenza-Unspecified 12/05/2012, 11/17/2013, 10/20/2014, 11/19/2017, 11/09/2018, 11/11/2022   Moderna Covid-19 Fall Seasonal Vaccine 58yrs & older 10/10/2022, 12/13/2022   Moderna Covid-19 Vaccine Bivalent Booster 65yrs & up 07/15/2021, 12/17/2021, 05/30/2022   Moderna Sars-Covid-2 Vaccination 04/11/2019, 05/08/2019, 01/10/2020, 06/25/2020, 11/27/2020   PNEUMOCOCCAL CONJUGATE-20 08/11/2022   Pneumococcal Conjugate-13 07/22/2011, 08/08/2013   Pneumococcal Polysaccharide-23 03/12/2012, 07/21/2012, 02/06/2018   RSV,unspecified 01/07/2022   Tdap 08/08/2013, 01/18/2023   Zoster Recombinant(Shingrix) 07/14/2016, 10/21/2016   Zoster, Live 08/21/2010    TDAP status: Up to date  Flu Vaccine status: Up to date  Pneumococcal vaccine status: Up to date  Covid-19 vaccine status: Information provided on how to obtain vaccines.  Qualifies for Shingles Vaccine? Yes   Zostavax completed No   Shingrix Completed?: Yes  Screening Tests Health Maintenance  Topic Date Due   COVID-19 Vaccine (11 - Moderna risk 2024-25 season) 06/12/2023   Medicare Annual Wellness (AWV)  05/31/2024   DTaP/Tdap/Td (3 - Td or Tdap) 01/17/2033   Pneumonia Vaccine 86+ Years old  Completed   INFLUENZA VACCINE  Completed   Hepatitis C Screening  Completed   Zoster Vaccines- Shingrix  Completed   HPV VACCINES  Aged Out   Colonoscopy  Discontinued    Health Maintenance  There are no preventive care reminders to display for this  patient.   Colorectal cancer screening: No longer required.   Lung Cancer Screening: (Low Dose CT Chest recommended if Age 47-80 years, 20 pack-year currently smoking OR have quit w/in 15years.) does not qualify.   Lung Cancer Screening Referral: na  Additional Screening:  Hepatitis C Screening: does qualify; Completed   Vision Screening: Recommended annual ophthalmology exams for early detection of glaucoma and other disorders of the eye. Is the patient up to date with their annual eye exam?  Yes  Who is the provider or what is the name of the office in which the patient attends annual eye exams? [facility].  If pt is not established with a provider, would they like to be referred to a provider to establish care? No .   Dental Screening: Recommended annual dental exams for proper oral hygiene  Community Resource Referral / Chronic Care Management: CRR required this visit?  No   CCM required this visit?  No     Plan:     I have personally reviewed and noted the following in the patient's chart:   Medical and social history Use of alcohol, tobacco or illicit drugs  Current medications and supplements including opioid prescriptions. Patient is not currently taking opioid prescriptions. Functional ability and status Nutritional status Physical activity Advanced directives List of other physicians Hospitalizations, surgeries, and ER visits in previous 12 months Vitals Screenings to include cognitive, depression, and falls Referrals and appointments  In addition, I have reviewed and discussed with patient certain preventive protocols, quality metrics, and best practice recommendations. A written personalized care plan for preventive services as well as general preventive health recommendations were provided to patient.     Sharon Seller, NP   06/01/2023

## 2023-06-01 NOTE — Patient Instructions (Signed)
  [name] [name] , Thank you for taking time to come for your Medicare Wellness Visit. I appreciate your ongoing commitment to your health goals. Please review the following plan we discussed and let me know if I can assist you in the future.     This is a list of the screening recommended for you and due dates:  Health Maintenance  Topic Date Due   COVID-19 Vaccine (11 - Moderna risk 2024-25 season) 06/12/2023   Medicare Annual Wellness Visit  05/31/2024   DTaP/Tdap/Td vaccine (3 - Td or Tdap) 01/17/2033   Pneumonia Vaccine  Completed   Flu Shot  Completed   Hepatitis C Screening  Completed   Zoster (Shingles) Vaccine  Completed   HPV Vaccine  Aged Out   Colon Cancer Screening  Discontinued

## 2023-06-06 ENCOUNTER — Encounter: Payer: Self-pay | Admitting: Cardiology

## 2023-06-06 DIAGNOSIS — R053 Chronic cough: Secondary | ICD-10-CM | POA: Diagnosis not present

## 2023-06-06 DIAGNOSIS — J479 Bronchiectasis, uncomplicated: Secondary | ICD-10-CM | POA: Diagnosis not present

## 2023-06-09 DIAGNOSIS — Z23 Encounter for immunization: Secondary | ICD-10-CM | POA: Diagnosis not present

## 2023-06-13 ENCOUNTER — Ambulatory Visit: Payer: Medicare Other | Attending: Cardiology | Admitting: Cardiology

## 2023-06-13 ENCOUNTER — Encounter: Payer: Self-pay | Admitting: Cardiology

## 2023-06-13 VITALS — BP 118/58 | HR 60 | Ht 72.0 in | Wt 195.4 lb

## 2023-06-13 DIAGNOSIS — I255 Ischemic cardiomyopathy: Secondary | ICD-10-CM | POA: Insufficient documentation

## 2023-06-13 DIAGNOSIS — I2510 Atherosclerotic heart disease of native coronary artery without angina pectoris: Secondary | ICD-10-CM | POA: Insufficient documentation

## 2023-06-13 NOTE — Progress Notes (Signed)
 Cardiology Office Note:    Date:  06/13/2023   ID:  [name] [name], DOB [birth year], MRN [id number]  PCP:  Earnestine Mealing, MD   [facility] Providers Cardiologist:  Debbe Odea, MD     Referring MD: Earnestine Mealing, MD   Chief Complaint  Patient presents with   Follow-up    6 month follow up. Patient is doing well on today. Meds reviewed.     History of Present Illness:    [name] [name] is a 78 y.o. male with a hx of CAD/STEMI s/p DES to LAD, PTCA diagonal on 09/2017, ischemic cardiomyopathy EF 35 to 40%, paroxysmal atrial fibrillation in setting of STEMI, right lung empyema 2016 presenting for follow-up.  Previously followed at our [facility].  Since MI in 2019, patient has tried Aldactone but was stopped due to hyperkalemia, Entresto not tolerated due to cough.  Takes Coreg, valsartan, Crestor, aspirin as prescribed.  Was previously on Eliquis due to atrial fibrillation in the setting of STEMI.  This was eventually stopped.  Follow-up cardiac monitor showed no recurrent A-fib.  He has a history of chronic cough since being diagnosed with an empyema in his right lung 9 years ago.  Recently started on inhalers by pulmonary medicine with good effect.  He feels well, denies chest pain or shortness of breath.  Compliant with medications as prescribed.  Denies edema.  Prior notes/testing Echo 10/2020 EF 40 to 45%. Not on Aldactone due to hyperkalemia, of Entresto due to cough. Atrial fibrillation in the setting of STEMI, follow-up cardiac monitor showed no A-fib.  Past Medical History:  Diagnosis Date   Acute systolic heart failure (HCC)    Anxiety    COPD (chronic obstructive pulmonary disease) (HCC)    Enlarged prostate with lower urinary tract symptoms (LUTS)    GERD (gastroesophageal reflux disease)    Hyperlipidemia    Hypertension    Low serum testosterone level    PAF (paroxysmal atrial fibrillation) (HCC)    Pneumonia 05/2014   Pyothorax  without fistula (HCC)    Sciatica    Shortness of breath dyspnea    Solitary pulmonary nodule    STEMI (ST elevation myocardial infarction) (HCC)    09/27/17 PCI/DES x1 mLAD, PTCA of the diag branch. EF 30% Lifevest at discharge.    Testicular hypofunction    Urinary frequency     Past Surgical History:  Procedure Laterality Date   APPENDECTOMY     BACK SURGERY     COLONOSCOPY  2011   Tubular adenoma per patient   COLONOSCOPY N/A 03/03/2015   Dr. Darrick Penna: Left colon is redundant, mild diverticulosis, small internal hemorrhoids, moderate external hemorrhoids.  Next colonoscopy in 5 years with overtube.   COLONOSCOPY N/A 03/25/2019   Procedure: COLONOSCOPY;  Surgeon: West Bali, MD;  [facility];  Service: Endoscopy;  Laterality: N/A;  8:30am - Camille's request   CORONARY ULTRASOUND/IVUS N/A 09/27/2017   Procedure: Intravascular Ultrasound/IVUS;  Surgeon: Kathleene Hazel, MD;  Location: [facility];  Service: Cardiovascular;  Laterality: N/A;   CORONARY/GRAFT ACUTE MI REVASCULARIZATION N/A 09/27/2017   Procedure: Coronary/Graft Acute MI Revascularization;  Surgeon: Kathleene Hazel, MD;  Location: [facility];  Service: Cardiovascular;  Laterality: N/A;   CYSTOSCOPY W/ RETROGRADES Bilateral 06/16/2020   Procedure: CYSTOSCOPY WITH RETROGRADE PYELOGRAM;  Surgeon: Riki Altes, MD;  Location: [facility] ORS;  Service: Urology;  Laterality: Bilateral;   EMPYEMA DRAINAGE Right 06/27/2014   Procedure: EMPYEMA  DRAINAGE;  Surgeon: Delight Ovens, MD;  Location: [facility] OR;  Service: Thoracic;  Laterality: Right;   HEMORRHOID BANDING N/A 03/03/2015   Procedure: Reinerton Lions;  Surgeon: West Bali, MD;  Location: [facility];  Service: Endoscopy;  Laterality: N/A;  Recomended to see a Careers adviser.   HERNIA REPAIR     LEFT HEART CATH AND CORONARY ANGIOGRAPHY N/A 09/27/2017   Procedure: LEFT HEART CATH AND CORONARY ANGIOGRAPHY;  Surgeon: Kathleene Hazel, MD;  [facility]: [facility];  Service: Cardiovascular;  Laterality: N/A;   LUNG SURGERY  2016   POLYPECTOMY  03/25/2019   Procedure: POLYPECTOMY;  Surgeon: West Bali, MD;  [facility];  Service: Endoscopy;;   TEE WITHOUT CARDIOVERSION N/A 07/14/2014   Procedure: TRANSESOPHAGEAL ECHOCARDIOGRAM (TEE);  Surgeon: Quintella Reichert, MD;  Location: [facility] ENDOSCOPY;  Service: Cardiovascular;  Laterality: N/A;   TRANSURETHRAL RESECTION OF BLADDER TUMOR N/A 06/16/2020   Procedure: TRANSURETHRAL RESECTION OF BLADDER TUMOR (TURBT) WITH GEMCITABINE;  Surgeon: Riki Altes, MD;  Location: [facility] ORS;  Service: Urology;  Laterality: N/A;   VARICOSE VEIN SURGERY     VIDEO ASSISTED THORACOSCOPY Right 06/27/2014   Procedure: VIDEO ASSISTED THORACOSCOPY;  Surgeon: Delight Ovens, MD;  Location: [facility] OR;  Service: Thoracic;  Laterality: Right;   VIDEO BRONCHOSCOPY N/A 06/27/2014   Procedure: VIDEO BRONCHOSCOPY;  Surgeon: Delight Ovens, MD;  Location: [facility] OR;  Service: Thoracic;  Laterality: N/A;    Current Medications: Current Meds  Medication Sig   acetaminophen (TYLENOL) 500 MG tablet Take 500 mg by mouth every 6 (six) hours as needed for moderate pain.   albuterol (PROVENTIL) (2.5 MG/3ML) 0.083% nebulizer solution Take 3 mLs (2.5 mg total) by nebulization in the morning and at bedtime.   aspirin EC 81 MG tablet Take 1 tablet (81 mg total) by mouth daily.   Calcium Citrate-Vitamin D (CALCIUM CITRATE + D3 PO) Take 1 tablet by mouth daily.   Carboxymethylcellul-Glycerin (LUBRICATING EYE DROPS OP) Place 1 drop into both eyes 2 (two) times daily. As needed   carvedilol (COREG) 6.25 MG tablet Take 1 tablet (6.25 mg total) by mouth 2 (two) times daily with a meal.   FARXIGA 10 MG TABS tablet TAKE 1 TABLET(10 MG) BY MOUTH DAILY   Menthol, Topical Analgesic, (BIOFREEZE EX) Apply topically.   Multiple Vitamins-Minerals (SENIOR VITES PO) Take 1 tablet by mouth daily.   nitroGLYCERIN (NITROSTAT)  0.4 MG SL tablet Place 1 tablet (0.4 mg total) under the tongue every 5 (five) minutes as needed.   rosuvastatin (CRESTOR) 40 MG tablet Take 1 tablet (40 mg total) by mouth daily.   sodium chloride HYPERTONIC 3 % nebulizer solution Take by nebulization in the morning and at bedtime.   tamsulosin (FLOMAX) 0.4 MG CAPS capsule Take 1 capsule (0.4 mg total) by mouth daily.   traZODone (DESYREL) 50 MG tablet Take 50 mg by mouth at bedtime as needed for sleep.   valsartan (DIOVAN) 80 MG tablet Take 1 tablet (80 mg total) by mouth daily.     Allergies:   Patient has no known allergies.   Social History   Socioeconomic History   Marital status: Married    Spouse name: Not on file   Number of children: Not on file   Years of education: Not on file   Highest education level: Not on file  Occupational History   Not on file  Tobacco Use   Smoking status: Former  Current packs/day: 0.00    Average packs/day: 1 pack/day for 10.0 years (10.0 ttl pk-yrs)    Types: Pipe, Cigarettes    Start date: 06/06/2004    Quit date: 06/07/2014    Years since quitting: 9.0    Passive exposure: Past   Smokeless tobacco: Never   Tobacco comments:    smoked pipe for 40 years, daily  Vaping Use   Vaping status: Never Used  Substance and Sexual Activity   Alcohol use: Not Currently    Comment: Average 1-2 drinks of beer in the evening; 04/29/20 none since 01/2020   Drug use: No   Sexual activity: Not on file  Other Topics Concern   Not on file  Social History Narrative   Not on file   Social Drivers of Health   Financial Resource Strain: Not on file  Food Insecurity: No Food Insecurity (06/01/2023)   Hunger Vital Sign    Worried About Running Out of Food in the Last Year: Never true    Ran Out of Food in the Last Year: Never true  Transportation Needs: No Transportation Needs (06/01/2023)   PRAPARE - Administrator, Civil Service (Medical): No    Lack of Transportation (Non-Medical): No   Physical Activity: Not on file  Stress: Not on file  Social Connections: Not on file     Family History: The patient's family history includes COPD in his mother; Hypertension in his father and mother; Tuberculosis in his father. There is no history of Colon cancer.  ROS:   Please see the history of present illness.     All other systems reviewed and are negative.  EKGs/Labs/Other Studies Reviewed:    The following studies were reviewed today:  EKG Interpretation Date/Time:  Tuesday June 13 2023 09:36:57 EDT Ventricular Rate:  60 PR Interval:  132 QRS Duration:  92 QT Interval:  422 QTC Calculation: 422 R Axis:   -60  Text Interpretation: Normal sinus rhythm Left anterior fascicular block Anterolateral infarct Confirmed by Debbe Odea ([id number]) on 06/13/2023 9:42:16 AM    Recent Labs: 01/30/2023: ALT 21; Hemoglobin 14.9; Magnesium 2.3; Platelets 202 05/18/2023: BUN 25; Creat 1.39; Potassium 4.6; Sodium 142  Recent Lipid Panel    Component Value Date/Time   CHOL 110 01/30/2023 0804   TRIG 63 01/30/2023 0804   HDL 50 01/30/2023 0804   CHOLHDL 2.2 01/30/2023 0804   LDLCALC 46 01/30/2023 0804     Risk Assessment/Calculations:             Physical Exam:    VS:  BP (!) 118/58   Pulse 60   Ht 6' (1.829 m)   Wt 195 lb 6.4 oz (88.6 kg)   SpO2 96%   BMI 26.50 kg/m     Wt Readings from Last 3 Encounters:  06/13/23 195 lb 6.4 oz (88.6 kg)  06/01/23 195 lb (88.5 kg)  05/17/23 198 lb (89.8 kg)     GEN:  Well nourished, well developed in no acute distress HEENT: Normal NECK: No JVD; No carotid bruits CARDIAC: RRR, no murmurs, rubs, gallops RESPIRATORY: Diminished breath sounds at right lung base. ABDOMEN: Soft, non-tender, non-distended MUSCULOSKELETAL:  No edema; No deformity  SKIN: Warm and dry NEUROLOGIC:  Alert and oriented x 3 PSYCHIATRIC:  Normal affect   ASSESSMENT:    1. Coronary artery disease involving native coronary artery of native heart  without angina pectoris   2. Ischemic cardiomyopathy    PLAN:    In order  of problems listed above:  CAD s/p DES to LAD, PTCA diagonal 2019.  Denies chest pain.  Continue aspirin, Crestor 40 mg daily.  Coreg 6.25 mg twice daily.  LDL at goal. Ischemic cardiomyopathy, EF 40 to 45%.  Appears euvolemic.  Continue Coreg 6.25 mg twice daily, valsartan 80 mg daily, Farxiga 10 mg daily.  Low normal BP preventing up titration of GDMT.  Previously did not tolerate Entresto, Aldactone caused hyperkalemia.  Repeat echo.  Follow-up in 6 months       Medication Adjustments/Labs and Tests Ordered: Current medicines are reviewed at length with the patient today.  Concerns regarding medicines are outlined above.  Orders Placed This Encounter  Procedures   EKG 12-Lead   ECHOCARDIOGRAM COMPLETE   No orders of the defined types were placed in this encounter.   Patient Instructions  Medication Instructions:  No changes at this time.   *If you need a refill on your cardiac medications before your next appointment, please call your pharmacy*   Lab Work: None  If you have labs (blood work) drawn today and your tests are completely normal, you will receive your results only by: MyChart Message (if you have MyChart) OR A paper copy in the mail If you have any lab test that is abnormal or we need to change your treatment, we will call you to review the results.   Testing/Procedures: Your physician has requested that you have an echocardiogram in 3 months.  Echocardiography is a painless test that uses sound waves to create images of your heart. It provides your doctor with information about the size and shape of your heart and how well your heart's chambers and valves are working. This procedure takes approximately one hour. There are no restrictions for this procedure. Please do NOT wear cologne, perfume, aftershave, or lotions (deodorant is allowed). Please arrive 15 minutes prior to your  appointment time.  Please note: We ask at that you not bring children with you during ultrasound (echo/ vascular) testing. Due to room size and safety concerns, children are not allowed in the ultrasound rooms during exams. Our front office staff cannot provide observation of children in our lobby area while testing is being conducted. An adult accompanying a patient to their appointment will only be allowed in the ultrasound room at the discretion of the ultrasound technician under special circumstances. We apologize for any inconvenience.    Follow-Up: At [facility], you and your health needs are our priority.  As part of our continuing mission to provide you with exceptional heart care, we have created designated Provider Care Teams.  These Care Teams include your primary Cardiologist (physician) and Advanced Practice Providers (APPs -  Physician Assistants and Nurse Practitioners) who all work together to provide you with the care you need, when you need it.    Your next appointment:   6 month(s)  Provider:   Debbe Odea, MD      Signed, Debbe Odea, MD  06/13/2023 10:21 AM    [facility]

## 2023-06-13 NOTE — Patient Instructions (Addendum)
 Medication Instructions:  No changes at this time.   *If you need a refill on your cardiac medications before your next appointment, please call your pharmacy*   Lab Work: None  If you have labs (blood work) drawn today and your tests are completely normal, you will receive your results only by: MyChart Message (if you have MyChart) OR A paper copy in the mail If you have any lab test that is abnormal or we need to change your treatment, we will call you to review the results.   Testing/Procedures: Your physician has requested that you have an echocardiogram in 3 months.  Echocardiography is a painless test that uses sound waves to create images of your heart. It provides your doctor with information about the size and shape of your heart and how well your heart's chambers and valves are working. This procedure takes approximately one hour. There are no restrictions for this procedure. Please do NOT wear cologne, perfume, aftershave, or lotions (deodorant is allowed). Please arrive 15 minutes prior to your appointment time.  Please note: We ask at that you not bring children with you during ultrasound (echo/ vascular) testing. Due to room size and safety concerns, children are not allowed in the ultrasound rooms during exams. Our front office staff cannot provide observation of children in our lobby area while testing is being conducted. An adult accompanying a patient to their appointment will only be allowed in the ultrasound room at the discretion of the ultrasound technician under special circumstances. We apologize for any inconvenience.    Follow-Up: At [facility], you and your health needs are our priority.  As part of our continuing mission to provide you with exceptional heart care, we have created designated Provider Care Teams.  These Care Teams include your primary Cardiologist (physician) and Advanced Practice Providers (APPs -  Physician Assistants and Nurse  Practitioners) who all work together to provide you with the care you need, when you need it.    Your next appointment:   6 month(s)  Provider:   Debbe Odea, MD

## 2023-06-29 ENCOUNTER — Other Ambulatory Visit: Payer: Self-pay | Admitting: Cardiology

## 2023-06-29 ENCOUNTER — Encounter: Payer: Self-pay | Admitting: Cardiology

## 2023-06-29 ENCOUNTER — Other Ambulatory Visit: Payer: Self-pay | Admitting: Student

## 2023-06-29 MED ORDER — CARVEDILOL 6.25 MG PO TABS: 6.2500 mg | ORAL_TABLET | Freq: Two times a day (BID) | ORAL | 3 refills | Status: AC

## 2023-07-17 ENCOUNTER — Ambulatory Visit (INDEPENDENT_AMBULATORY_CARE_PROVIDER_SITE_OTHER): Admitting: Podiatry

## 2023-07-17 ENCOUNTER — Encounter: Payer: Self-pay | Admitting: Podiatry

## 2023-07-17 VITALS — Ht 72.0 in | Wt 195.4 lb

## 2023-07-17 DIAGNOSIS — M2041 Other hammer toe(s) (acquired), right foot: Secondary | ICD-10-CM | POA: Diagnosis not present

## 2023-07-17 DIAGNOSIS — L84 Corns and callosities: Secondary | ICD-10-CM | POA: Diagnosis not present

## 2023-07-17 DIAGNOSIS — M2042 Other hammer toe(s) (acquired), left foot: Secondary | ICD-10-CM | POA: Diagnosis not present

## 2023-07-17 DIAGNOSIS — B351 Tinea unguium: Secondary | ICD-10-CM | POA: Diagnosis not present

## 2023-07-17 DIAGNOSIS — S90221A Contusion of right lesser toe(s) with damage to nail, initial encounter: Secondary | ICD-10-CM

## 2023-07-17 NOTE — Patient Instructions (Signed)
 VISIT SUMMARY:  Today, we discussed several concerns regarding your feet, including bruising, pain, and callus formation. We examined your right second toenail, both fifth toes, and the inside of your right great toe. We also reviewed your current medications and provided recommendations for managing your conditions.  YOUR PLAN:  -HALLUX VALGUS DEFORMITY: Hallux valgus deformity is a condition where the big toe deviates towards the other toes, often leading to a bunion. Currently, your condition is asymptomatic, and no surgical intervention is needed. We will continue to monitor it.  -CALLUS ON RIGHT GREAT TOE: A callus is a thickened area of skin that forms due to repeated pressure or friction. The callus on your right great toe is secondary to the hallux valgus deformity. We debrided the lesion today and ruled out any warts. We will continue to monitor this condition.  -HAMMER TOE DEFORMITIES: Hammer toe is a deformity where the toe becomes bent at the middle joint, resembling a hammer. You have bilateral hammer toe deformities with significant rotational changes in the fifth toe, likely due to arthritis. We recommend using silicone pads to offload pressure and wearing appropriate shoes. If the condition worsens, we may consider x-rays and possibly surgery.  -SUBUNGUAL HEMATOMA OF RIGHT SECOND TOENAIL: A subungual hematoma is a bruise under the toenail, often caused by injury. Your bruise is healing well and migrating out. It may take six months or more for the toenail to fully regrow. Continue using tolnaftate solution and avoid Lamisil due to potential interactions with your statin medication.  INSTRUCTIONS:  Please continue using the tolnaftate solution for your toenail and avoid Lamisil. Use silicone pads to offload pressure from your hammer toes and ensure you wear appropriate shoes. We will monitor your conditions, and if there are any changes or worsening symptoms, we may consider further  imaging or surgical options. Follow up with us  if you experience any new or worsening symptoms.

## 2023-07-17 NOTE — Progress Notes (Unsigned)
 Subjective:  Patient ID: [name] [name], male    DOB: 11/30/1945,  MRN: [id number]  Chief Complaint  Patient presents with   Callouses    RM7: NP - corn, callus has black spot in the middle of it on right foot. Left foot has some rubbing with shoes    Discussed the use of AI scribe software for clinical note transcription with the patient, who gave verbal consent to proceed.  History of Present Illness [name] [name] "[name] [name]" is a 78 year old male who presents with multiple foot concerns including bruising, pain, and callus formation.  He has a bruise on the right second toenail, which he does not recall injuring. The bruise appears to be growing out or migrating and is not currently painful. The nail does not seem loose.  He experiences pain on the fifth toe bilaterally. His wife observed what she thought was a corn in this area. He does not recall any specific injury to these toes.  He has noticed a callus developing on the inside of the right great toe. He uses tolnaftate liquid for nail fungus on the left great toe.  He is currently on a statin medication.      Objective:    Physical Exam VASCULAR: DP and PT pulse palpable. Foot is warm and well-perfused. Capillary fill time is brisk. DERMATOLOGIC: Hyperkeratosis on medial right hallux. Right second toe bruise on nail plate without active bleeding or onycholysis. Normal skin turgor, texture, and temperature. No open lesions, rashes, or ulcerations. NEUROLOGIC: Normal sensation to light touch and pressure. No paresthesias on examination. ORTHOPEDIC: Hallux valgus deformity and hammer toe contractures bilaterally. Significant rotational deformity of the fifth toe bilaterally. Prominent PIPJ with slight tenderness to palpation with range of motion. No ecchymosis or bruising. No gross deformity.   No images are attached to the encounter.    Results Procedure: Debridement of callus Description: Debrided the lesion on the right  great toe. No verruca plantaris noted. Suspected secondary to hallux valgus deformity.  RADIOLOGY Foot X-ray: Hammer toe deformities, significant rotational deformity of the fifth toe, arthritic changes in the PIP joint (07/17/2023)   Assessment:   1. Hammertoe of right foot   2. Hammertoe of left foot   3. Callus of foot   4. Onychomycosis   5. Subungual hematoma of right foot, initial encounter      Plan:  Patient was evaluated and treated and all questions answered.  Assessment and Plan Assessment & Plan Hallux valgus deformity Asymptomatic hallux valgus deformity with a secondary callus on the medial right hallux. No surgical intervention recommended at this time. - Monitor condition  Callus on right great toe Callus on the medial right great toe, secondary to hallux valgus deformity. Debrided lesion today and ruled out verruca plantaris. - Monitor condition  Hammer toe deformities Bilateral hammer toe contractures with significant rotational deformity of the fifth toe. Suspected arthritic changes in the PIPJ contributing to the condition. Slight tenderness to palpation with range of motion of the PIPJ. Discussed offloading with silicone pads and the importance of appropriate shoe gear. X-rays and surgical intervention may be considered if condition worsens or does not improve. - Recommend offloading with silicone pads - Emphasize appropriate shoe gear - Consider x-rays and surgical intervention if condition worsens or does not improve  Subungual hematoma of right second toenail Subungual hematoma on the right second toenail is migrating out and healing well. No active bleeding or onycholysis. Proximal clearing noted. Expected to take six  months or more for the toenail to regrow. Continues to use tolnaftate solution. Advised against using Lamisil due to potential interaction with statin and previous side effects experienced by spouse. - Continue tolnaftate solution - Avoid  Lamisil due to potential interaction with statin      Return if symptoms worsen or fail to improve.

## 2023-07-25 ENCOUNTER — Ambulatory Visit: Attending: Cardiovascular Disease

## 2023-07-25 DIAGNOSIS — I2510 Atherosclerotic heart disease of native coronary artery without angina pectoris: Secondary | ICD-10-CM | POA: Insufficient documentation

## 2023-07-25 DIAGNOSIS — I255 Ischemic cardiomyopathy: Secondary | ICD-10-CM | POA: Insufficient documentation

## 2023-07-25 LAB — ECHOCARDIOGRAM COMPLETE
AR max vel: 4.15 cm2
AV Area VTI: 4.38 cm2
AV Area mean vel: 3.95 cm2
AV Mean grad: 2 mmHg
AV Peak grad: 4.6 mmHg
Ao pk vel: 1.07 m/s
Area-P 1/2: 2.73 cm2
S' Lateral: 4.21 cm

## 2023-07-27 ENCOUNTER — Encounter: Payer: Self-pay | Admitting: Student in an Organized Health Care Education/Training Program

## 2023-07-27 ENCOUNTER — Ambulatory Visit (INDEPENDENT_AMBULATORY_CARE_PROVIDER_SITE_OTHER): Payer: Medicare Other | Admitting: Student in an Organized Health Care Education/Training Program

## 2023-07-27 VITALS — BP 134/68 | HR 62 | Temp 97.7°F | Ht 72.0 in | Wt 197.0 lb

## 2023-07-27 DIAGNOSIS — R053 Chronic cough: Secondary | ICD-10-CM | POA: Diagnosis not present

## 2023-07-27 DIAGNOSIS — J479 Bronchiectasis, uncomplicated: Secondary | ICD-10-CM | POA: Diagnosis not present

## 2023-07-27 DIAGNOSIS — I5022 Chronic systolic (congestive) heart failure: Secondary | ICD-10-CM

## 2023-07-29 NOTE — Progress Notes (Signed)
 Synopsis: Referred in for bronchiectasis by Valrie Gehrig, MD  Assessment & Plan:   1. Bronchiectasis without complication (HCC) (Primary) 2. Chronic cough  Patient with a history of bronchiectasis secondary to his prior severe right lower lobe pneumonia and empyema in 2016 s/p VATS decortication. Imaging studies show airway thickening with retained mucus and tree-in-bud in the RLL which I suspect was due to retained secretions and inability to expectorate with a resultant super imposed infection.   His previous cultures from July of 2024 had grown candida, and repeat ordered in November of 2024 also only growing candida. He did have worsening symptoms during a prior visit and was treated with a course of levofloxacin  with significant improvement. He now has a minimal cough which is a little increased compared to December. He continues on his pulmonary clearance regimen.  Will continue the current regimen of pulmonary clearance to help mobilize secretions (3% hypertonic saline, albuterol , flutter device, and incentive spirometer). He is inquiring about the possibility of decreasing some components of this regimen as it takes him a total of 90 minutes daily (over two sessions). We will minimize the duration of albuterol  inhalation by 10 minutes and monitor symptoms. Also discussed that should his symptoms progress (and cough increase in frequency or amount of sputum) we will need to treat with antibiotics. I will attempt to minimize the amount of antibiotics to decrease the chances of bacterial resistance. We will consider bronchoscopy in the future for cultures should antibiotics be warranted.  Return in about 6 months (around 01/27/2024).  I spent 31 minutes caring for this patient today, including preparing to see the patient, obtaining a medical history , reviewing a separately obtained history, performing a medically appropriate examination and/or evaluation, counseling and educating the  patient/family/caregiver, referring and communicating with other health care professionals (not separately reported), documenting clinical information in the electronic health record, and independently interpreting results (not separately reported/billed) and communicating results to the patient/family/caregiver  Vergia Glasgow, MD [facility]  End of visit medications:  No orders of the defined types were placed in this encounter.    Current Outpatient Medications:    acetaminophen  (TYLENOL ) 500 MG tablet, Take 500 mg by mouth every 6 (six) hours as needed for moderate pain., Disp: , Rfl:    albuterol  (PROVENTIL ) (2.5 MG/3ML) 0.083% nebulizer solution, Take 3 mLs (2.5 mg total) by nebulization in the morning and at bedtime., Disp: 250 mL, Rfl: 11   aspirin  EC 81 MG tablet, Take 1 tablet (81 mg total) by mouth daily., Disp: 90 tablet, Rfl: 3   Calcium  Citrate-Vitamin D  (CALCIUM  CITRATE + D3 PO), Take 1 tablet by mouth daily., Disp: , Rfl:    Carboxymethylcellul-Glycerin (LUBRICATING EYE DROPS OP), Place 1 drop into both eyes 2 (two) times daily. As needed, Disp: , Rfl:    carvedilol  (COREG ) 6.25 MG tablet, Take 1 tablet (6.25 mg total) by mouth 2 (two) times daily with a meal., Disp: 180 tablet, Rfl: 3   FARXIGA  10 MG TABS tablet, TAKE 1 TABLET(10 MG) BY MOUTH DAILY, Disp: 90 tablet, Rfl: 3   Menthol , Topical Analgesic, (BIOFREEZE EX), Apply topically., Disp: , Rfl:    Multiple Vitamins-Minerals (SENIOR VITES PO), Take 1 tablet by mouth daily., Disp: , Rfl:    nitroGLYCERIN  (NITROSTAT ) 0.4 MG SL tablet, Place 1 tablet (0.4 mg total) under the tongue every 5 (five) minutes as needed., Disp: 25 tablet, Rfl: 3   rosuvastatin  (CRESTOR ) 40 MG tablet, TAKE 1 TABLET(40 MG)  BY MOUTH DAILY, Disp: 90 tablet, Rfl: 3   sodium chloride  HYPERTONIC 3 % nebulizer solution, Take by nebulization in the morning and at bedtime., Disp: 1500 mL, Rfl: 11   tamsulosin  (FLOMAX ) 0.4 MG CAPS capsule,  TAKE 1 CAPSULE(0.4 MG) BY MOUTH DAILY, Disp: 90 capsule, Rfl: 1   valsartan  (DIOVAN ) 80 MG tablet, Take 1 tablet (80 mg total) by mouth daily., Disp: 90 tablet, Rfl: 3   traZODone  (DESYREL ) 50 MG tablet, Take 50 mg by mouth at bedtime as needed for sleep. (Patient not taking: Reported on 07/27/2023), Disp: , Rfl:    Subjective:   PATIENT ID: [name] [name] GENDER: male DOB: 10-15-1945, MRN: [id number]  Chief Complaint  Patient presents with   Follow-up    HPI  Mr. Wilker is a pleasant 78 year old male presenting today for follow-up.  He had reported significant improvement in his symptoms during our last visit in December 2024 with resolution of the cough and improvement in his shortness of breath.  He was doing quite well and continues to do so.  He is reporting a very sporadic cough that is dry and without sputum production.  This is minimally increased compared to our last visit.  He continues to be compliant with his pulmonary clearance regimen.  He has not used any inhalers, and reports not feeling any improvement or any change in symptoms with the different inhalers previously prescribed, including Trelegy.  He has not used any inhalers for quite a long time.  I prescribed a course of levofloxacin  following our visit in November of 2024 where he reported cough and increased shortness of breath consistent with an exacerbation of bronchiectasis.  The combination of antibiotics and the pulmonary clearance regimen has resulted in near resolution of all symptoms.   During our previous visits, he'd reported a cough that was persistent and productive. Imaging studies, including multiple chest CT, had shown bronchiectasis in the RLL. He was not on inhalers at the time of our visit, reporting minimal improvement after trying them previously. He reported exacerbations in his breathing a few times a year with fever and increase in sputum production.     Patient reports that his respiratory symptoms  all started in 2016 following a pulmonary infection that was complicated by empyema. Review of the medical record is notable for an admission in April of 2016 where he was seen for empyema and underwent flexible bronchoscopy in addition to VATS decortication of the visceral pleura. CT abdomen/pelvis March 2016 showed a RLL pneuomonia, while CT chest from April 7 of 2016 showed a loculated pleural effusion consistent with empyema. The following CT from April 20 of 2016 showed persistent collapse/atelectasis in the RLL. June 2016 imaging showed re-expansion of the RLL with post infectious fibrosis and scarring. A CT chest from August of 2017 showed very mild bronchiolectasis with post surgical changes. Bronchiectasis begins to be evident in imaging from march of 2019, with associated tree-in-bud opacities in addition to the ectatic and dilated bronchi.   Patient previously worked as an Programmer, systems, Holiday representative in college. He is a former smoker, with around 10 pack years of smoking history, in addition to smoking pipe for over 40 years. He is currently retired.  Ancillary information including prior medications, full medical/surgical/family/social histories, and PFTs (when available) are listed below and have been reviewed.   Review of Systems  Constitutional:  Negative for chills, fever and weight loss.  Respiratory:  Positive for cough (minimal). Negative for hemoptysis, sputum production, shortness  of breath and wheezing.   Cardiovascular:  Negative for chest pain.     Objective:   Vitals:   07/27/23 1049  BP: 134/68  Pulse: 62  Temp: 97.7 F (36.5 C)  TempSrc: Oral  SpO2: 100%  Weight: 197 lb (89.4 kg)  Height: 6' (1.829 m)   100% on RA  BMI Readings from Last 3 Encounters:  07/27/23 26.72 kg/m  07/17/23 26.50 kg/m  06/13/23 26.50 kg/m   Wt Readings from Last 3 Encounters:  07/27/23 197 lb (89.4 kg)  07/17/23 195 lb 6.4 oz (88.6 kg)  06/13/23 195 lb 6.4 oz  (88.6 kg)    Physical Exam Constitutional:      Appearance: Normal appearance.  Cardiovascular:     Rate and Rhythm: Normal rate and regular rhythm.     Pulses: Normal pulses.     Heart sounds: Normal heart sounds.  Pulmonary:     Effort: Pulmonary effort is normal.     Breath sounds: Rales (RLL) present. No wheezing or rhonchi.  Musculoskeletal:     Right lower leg: No edema.     Left lower leg: No edema.  Neurological:     General: No focal deficit present.     Mental Status: He is alert and oriented to person, place, and time. Mental status is at baseline.       Ancillary Information    Past Medical History:  Diagnosis Date   Acute systolic heart failure (HCC)    Anxiety    COPD (chronic obstructive pulmonary disease) (HCC)    Enlarged prostate with lower urinary tract symptoms (LUTS)    GERD (gastroesophageal reflux disease)    Hyperlipidemia    Hypertension    Low serum testosterone  level    PAF (paroxysmal atrial fibrillation) (HCC)    Pneumonia 05/2014   Pyothorax without fistula (HCC)    Sciatica    Shortness of breath dyspnea    Solitary pulmonary nodule    STEMI (ST elevation myocardial infarction) (HCC)    09/27/17 PCI/DES x1 mLAD, PTCA of the diag branch. EF 30% Lifevest at discharge.    Testicular hypofunction    Urinary frequency      Family History  Problem Relation Age of Onset   Tuberculosis Father    Hypertension Father    COPD Mother    Hypertension Mother    Colon cancer Neg Hx      Past Surgical History:  Procedure Laterality Date   APPENDECTOMY     BACK SURGERY     COLONOSCOPY  2011   Tubular adenoma per patient   COLONOSCOPY N/A 03/03/2015   Dr. Nolene Baumgarten: Left colon is redundant, mild diverticulosis, small internal hemorrhoids, moderate external hemorrhoids.  Next colonoscopy in 5 years with overtube.   COLONOSCOPY N/A 03/25/2019   Procedure: COLONOSCOPY;  Surgeon: Alyce Jubilee, MD;  Location: [facility];  Service: Endoscopy;   Laterality: N/A;  8:30am - Camille's request   CORONARY ULTRASOUND/IVUS N/A 09/27/2017   Procedure: Intravascular Ultrasound/IVUS;  Surgeon: Odie Benne, MD;  Location: [facility];  Service: Cardiovascular;  Laterality: N/A;   CORONARY/GRAFT ACUTE MI REVASCULARIZATION N/A 09/27/2017   Procedure: Coronary/Graft Acute MI Revascularization;  Surgeon: Odie Benne, MD;  Location: [facility];  Service: Cardiovascular;  Laterality: N/A;   CYSTOSCOPY W/ RETROGRADES Bilateral 06/16/2020   Procedure: CYSTOSCOPY WITH RETROGRADE PYELOGRAM;  Surgeon: Geraline Knapp, MD;  Location: [facility];  Service: Urology;  Laterality: Bilateral;   EMPYEMA DRAINAGE  Right 06/27/2014   Procedure: EMPYEMA DRAINAGE;  Surgeon: Norita Beauvais, MD;  Location: [facility] OR;  Service: Thoracic;  Laterality: Right;   HEMORRHOID BANDING N/A 03/03/2015   Procedure: Harles Lied;  Surgeon: Alyce Jubilee, MD;  Location: [facility];  Service: Endoscopy;  Laterality: N/A;  Recomended to see a Careers adviser.   HERNIA REPAIR     LEFT HEART CATH AND CORONARY ANGIOGRAPHY N/A 09/27/2017   Procedure: LEFT HEART CATH AND CORONARY ANGIOGRAPHY;  Surgeon: Odie Benne, MD;  Location: [facility];  Service: Cardiovascular;  Laterality: N/A;   LUNG SURGERY  2016   POLYPECTOMY  03/25/2019   Procedure: POLYPECTOMY;  Surgeon: Alyce Jubilee, MD;  Location: [facility];  Service: Endoscopy;;   TEE WITHOUT CARDIOVERSION N/A 07/14/2014   Procedure: TRANSESOPHAGEAL ECHOCARDIOGRAM (TEE);  Surgeon: Jacqueline Matsu, MD;  Location: [facility] END[facility];  Service: Cardiovascular;  Laterality: N/A;   TRANSURETHRAL RESECTION OF BLADDER TUMOR N/A 06/16/2020   Procedure: TRANSURETHRAL RESECTION OF BLADDER TUMOR (TURBT) WITH GEMCITABINE ;  Surgeon: Geraline Knapp, MD;  Location: [facility];  Service: Urology;  Laterality: N/A;   VARICOSE VEIN SURGERY     VIDEO ASSISTED THORACOSCOPY Right 06/27/2014   Procedure: VIDEO  ASSISTED THORACOSCOPY;  Surgeon: Norita Beauvais, MD;  Location: [facility] OR;  Service: Thoracic;  Laterality: Right;   VIDEO BRONCHOSCOPY N/A 06/27/2014   Procedure: VIDEO BRONCHOSCOPY;  Surgeon: Norita Beauvais, MD;  Location: [facility] OR;  Service: Thoracic;  Laterality: N/A;    Social History   Socioeconomic History   Marital status: Married    Spouse name: Not on file   Number of children: Not on file   Years of education: Not on file   Highest education level: Not on file  Occupational History   Not on file  Tobacco Use   Smoking status: Former    Current packs/day: 0.00    Average packs/day: 1 pack/day for 10.0 years (10.0 ttl pk-yrs)    Types: Pipe, Cigarettes    Start date: 06/06/2004    Quit date: 06/07/2014    Years since quitting: 9.1    Passive exposure: Past   Smokeless tobacco: Never   Tobacco comments:    smoked pipe for 40 years, daily  Vaping Use   Vaping status: Never Used  Substance and Sexual Activity   Alcohol use: Not Currently    Comment: Average 1-2 drinks of beer in the evening; 04/29/20 none since 01/2020   Drug use: No   Sexual activity: Not on file  Other Topics Concern   Not on file  Social History Narrative   Not on file   Social Drivers of Health   Financial Resource Strain: Not on file  Food Insecurity: No Food Insecurity (06/01/2023)   Hunger Vital Sign    Worried About Running Out of Food in the Last Year: Never true    Ran Out of Food in the Last Year: Never true  Transportation Needs: No Transportation Needs (06/01/2023)   PRAPARE - Administrator, Civil Service (Medical): No    Lack of Transportation (Non-Medical): No  Physical Activity: Not on file  Stress: Not on file  Social Connections: Not on file  Intimate Partner Violence: Not At Risk (06/01/2023)   Humiliation, Afraid, Rape, and Kick questionnaire    Fear of Current or Ex-Partner: No    Emotionally Abused: No    Physically Abused: No    Sexually Abused: No  No  Known Allergies   CBC    Component Value Date/Time   WBC 10.1 01/30/2023 0804   RBC 4.62 01/30/2023 0804   HGB 14.9 01/30/2023 0804   HGB 15.1 01/10/2022 1049   HCT 44.7 01/30/2023 0804   HCT 45.9 01/10/2022 1049   PLT 202 01/30/2023 0804   PLT 239 01/10/2022 1049   MCV 96.8 01/30/2023 0804   MCV 96 01/10/2022 1049   MCH 32.3 01/30/2023 0804   MCHC 33.3 01/30/2023 0804   RDW 12.7 01/30/2023 0804   RDW 12.2 01/10/2022 1049   LYMPHSABS 1.3 01/10/2022 1049   MONOABS 1.2 (H) 12/13/2019 1006   EOSABS 131 01/30/2023 0804   EOSABS 0.2 01/10/2022 1049   BASOSABS 61 01/30/2023 0804   BASOSABS 0.1 01/10/2022 1049    Pulmonary Functions Testing Results:    Latest Ref Rng & Units 08/06/2019    1:47 PM  PFT Results  FVC-Pre L 3.80   FVC-Predicted Pre % 80   FVC-Post L 4.08   FVC-Predicted Post % 86   Pre FEV1/FVC % % 60   Post FEV1/FCV % % 64   FEV1-Pre L 2.26   FEV1-Predicted Pre % 66   FEV1-Post L 2.60   DLCO uncorrected ml/min/mmHg 18.09   DLCO UNC% % 66   DLCO corrected ml/min/mmHg 18.09   DLCO COR %Predicted % 66   DLVA Predicted % 79     Outpatient Medications Prior to Visit  Medication Sig Dispense Refill   acetaminophen  (TYLENOL ) 500 MG tablet Take 500 mg by mouth every 6 (six) hours as needed for moderate pain.     albuterol  (PROVENTIL ) (2.5 MG/3ML) 0.083% nebulizer solution Take 3 mLs (2.5 mg total) by nebulization in the morning and at bedtime. 250 mL 11   aspirin  EC 81 MG tablet Take 1 tablet (81 mg total) by mouth daily. 90 tablet 3   Calcium  Citrate-Vitamin D  (CALCIUM  CITRATE + D3 PO) Take 1 tablet by mouth daily.     Carboxymethylcellul-Glycerin (LUBRICATING EYE DROPS OP) Place 1 drop into both eyes 2 (two) times daily. As needed     carvedilol  (COREG ) 6.25 MG tablet Take 1 tablet (6.25 mg total) by mouth 2 (two) times daily with a meal. 180 tablet 3   FARXIGA  10 MG TABS tablet TAKE 1 TABLET(10 MG) BY MOUTH DAILY 90 tablet 3   Menthol , Topical Analgesic,  (BIOFREEZE EX) Apply topically.     Multiple Vitamins-Minerals (SENIOR VITES PO) Take 1 tablet by mouth daily.     nitroGLYCERIN  (NITROSTAT ) 0.4 MG SL tablet Place 1 tablet (0.4 mg total) under the tongue every 5 (five) minutes as needed. 25 tablet 3   rosuvastatin  (CRESTOR ) 40 MG tablet TAKE 1 TABLET(40 MG) BY MOUTH DAILY 90 tablet 3   sodium chloride  HYPERTONIC 3 % nebulizer solution Take by nebulization in the morning and at bedtime. 1500 mL 11   tamsulosin  (FLOMAX ) 0.4 MG CAPS capsule TAKE 1 CAPSULE(0.4 MG) BY MOUTH DAILY 90 capsule 1   valsartan  (DIOVAN ) 80 MG tablet Take 1 tablet (80 mg total) by mouth daily. 90 tablet 3   traZODone  (DESYREL ) 50 MG tablet Take 50 mg by mouth at bedtime as needed for sleep. (Patient not taking: Reported on 07/27/2023)     sertraline  (ZOLOFT ) 50 MG tablet TAKE 1 TABLET BY MOUTH DAILY (Patient not taking: Reported on 07/27/2023) 90 tablet 1   No facility-administered medications prior to visit.

## 2023-08-02 ENCOUNTER — Encounter: Payer: Self-pay | Admitting: Student

## 2023-08-02 ENCOUNTER — Ambulatory Visit: Payer: Medicare Other | Admitting: Student

## 2023-08-02 VITALS — BP 124/70 | HR 67 | Temp 97.9°F | Ht 72.0 in | Wt 199.0 lb

## 2023-08-02 DIAGNOSIS — N1831 Chronic kidney disease, stage 3a: Secondary | ICD-10-CM | POA: Diagnosis not present

## 2023-08-02 DIAGNOSIS — I5020 Unspecified systolic (congestive) heart failure: Secondary | ICD-10-CM

## 2023-08-02 DIAGNOSIS — J449 Chronic obstructive pulmonary disease, unspecified: Secondary | ICD-10-CM | POA: Diagnosis not present

## 2023-08-02 NOTE — Patient Instructions (Signed)
 VISIT SUMMARY:  Today, we discussed your heart health, recent changes in your medication, and some new concerns about bruising and toenail soreness. Your heart condition remains stable, and we reviewed your current medications and lifestyle recommendations. We also talked about the importance of monitoring your weight daily and maintaining a low salt diet. Additionally, we addressed your concerns about easy bruising and toenail soreness.  YOUR PLAN:  -HEART FAILURE WITH REDUCED EJECTION FRACTION: Heart failure with reduced ejection fraction means your heart is not pumping as efficiently as it should. Continue taking carvedilol , Farxiga , and valsartan  as prescribed. Maintain a low salt diet and monitor your weight daily to detect any fluid retention. Follow up with the heart clinic on Tuesday.  -MYOCARDIAL INFARCTION: A myocardial infarction, or heart attack, occurs when blood flow to the heart is blocked. You had a heart attack in 2019 and had a stent placed. Continue your current statin therapy and aspirin . Follow up with your cardiologist as scheduled.  -EASY BRUISING DUE TO ASPIRIN  USE: Aspirin  can increase the risk of bleeding and bruising. You have noticed easy bruising and prolonged healing of scrapes. Discuss any significant changes with your dermatologist during your annual visit in September.  -TOENAIL BRUISING AND CALLUSES: The soreness and bruising in your big toes are likely due to ill-fitting shoes and long, thick toenails. Soak your feet in lukewarm water  with Epsom salts to reduce inflammation. Consider purchasing shoes with a wider toe box to prevent further bruising.  INSTRUCTIONS:  Follow up with the heart clinic on Tuesday and your cardiologist as scheduled. Discuss any significant changes in bruising with your dermatologist during your annual visit in September.

## 2023-08-03 ENCOUNTER — Encounter: Payer: Self-pay | Admitting: Student

## 2023-08-03 NOTE — Progress Notes (Signed)
 [facility]: [facility] Provider: Jann Melody  Code Status: Full Code Goals of Care:     08/02/2023    3:36 PM  Advanced Directives  Does Patient Have a Medical Advance Directive? Yes  Type of Estate agent of [place];Out of facility DNR (pink MOST or yellow form);Living will  Does patient want to make changes to medical advance directive? No - Patient declined  Copy of Healthcare Power of Attorney in Chart? No - copy requested     Chief Complaint  Patient presents with   Medical Management of Chronic Issues    Medical Management of Chronic Issues. 3 Month follow up. Complains of Both Big Toes hurting.     HPI: Patient is a 78 y.o. male seen today for medical management of chronic diseases.   Discussed the use of AI scribe software for clinical note transcription with the patient, who gave verbal consent to proceed.  History of Present Illness   [name] [name] "[name] [name]" is a 78 year old male with a history of heart attack and reduced ejection fraction who presents for follow-up on his cardiac health.  He stopped taking sertraline  two to three months ago and is sleeping well without feeling depressed, as long as he avoids watching the news.  He has a history of lung issues and has been on different medications since December. His condition was stable in December and January but has slightly deteriorated since then, although it remains better than before. He had a follow-up with his lung specialist last week and is scheduled for another in six months.  He had a heart attack in 2019, during which a stent was placed. A recent echocardiogram showed an ejection fraction of 30-35%. He is scheduled to visit the heart clinic on Tuesday. He is currently on carvedilol , Farxiga , and valsartan , and monitors his weight daily to manage his heart condition. He is concerned about his reduced ejection fraction compared to previous measurements.  He bruises easily  and notes that scrapes take a long time to heal, which he attributes to aspirin  use. A specific scar took a month or two to heal.  Recently, he changed his exercise shoes, leading to soreness in both big toes. After cutting his long and thick toenails, the soreness worsened. He suspects the new shoes might be too snug, causing bruising around the toenail bed, and plans to try Epsom salt soaks to alleviate inflammation.         Past Medical History:  Diagnosis Date   Acute systolic heart failure (HCC)    Anxiety    COPD (chronic obstructive pulmonary disease) (HCC)    Enlarged prostate with lower urinary tract symptoms (LUTS)    GERD (gastroesophageal reflux disease)    Hyperlipidemia    Hypertension    Low serum testosterone  level    PAF (paroxysmal atrial fibrillation) (HCC)    Pneumonia 05/2014   Pyothorax without fistula (HCC)    Sciatica    Shortness of breath dyspnea    Solitary pulmonary nodule    STEMI (ST elevation myocardial infarction) (HCC)    09/27/17 PCI/DES x1 mLAD, PTCA of the diag branch. EF 30% Lifevest at discharge.    Testicular hypofunction    Urinary frequency     Past Surgical History:  Procedure Laterality Date   APPENDECTOMY     BACK SURGERY     COLONOSCOPY  2011   Tubular adenoma per patient   COLONOSCOPY N/A 03/03/2015  Dr. Nolene Baumgarten: Left colon is redundant, mild diverticulosis, small internal hemorrhoids, moderate external hemorrhoids.  Next colonoscopy in 5 years with overtube.   COLONOSCOPY N/A 03/25/2019   Procedure: COLONOSCOPY;  Surgeon: Alyce Jubilee, MD;  [facility];  Service: Endoscopy;  Laterality: N/A;  8:30am - Camille's request   CORONARY ULTRASOUND/IVUS N/A 09/27/2017   Procedure: Intravascular Ultrasound/IVUS;  Surgeon: Odie Benne, MD;  [facility]: [facility];  Service: Cardiovascular;  Laterality: N/A;   CORONARY/GRAFT ACUTE MI REVASCULARIZATION N/A 09/27/2017   Procedure: Coronary/Graft Acute MI  Revascularization;  Surgeon: Odie Benne, MD;  Location: [facility];  Service: Cardiovascular;  Laterality: N/A;   CYSTOSCOPY W/ RETROGRADES Bilateral 06/16/2020   Procedure: CYSTOSCOPY WITH RETROGRADE PYELOGRAM;  Surgeon: Geraline Knapp, MD;  Location: [facility];  Service: Urology;  Laterality: Bilateral;   EMPYEMA DRAINAGE Right 06/27/2014   Procedure: EMPYEMA DRAINAGE;  Surgeon: Norita Beauvais, MD;  Location[facility];  Service: Thoracic;  Laterality: Right;   HEMORRHOID BANDING N/A 03/03/2015   Procedure: Harles Lied;  Surgeon: Alyce Jubilee, MD;  Location: [facility];  Service: Endoscopy;  Laterality: N/A;  Recomended to see a Careers adviser.   HERNIA REPAIR     LEFT HEART CATH AND CORONARY ANGIOGRAPHY N/A 09/27/2017   Procedure: LEFT HEART CATH AND CORONARY ANGIOGRAPHY;  Surgeon: Odie Benne, MD;  [facility];  Service: Cardiovascular;  Laterality: N/A;   LUNG SURGERY  2016   POLYPECTOMY  03/25/2019   Procedure: POLYPECTOMY;  Surgeon: Alyce Jubilee, MD;  [facility]: [facility];  Service: Endoscopy;;   TEE WITHOUT CARDIOVERSION N/A 07/14/2014   Procedure: TRANSESOPHAGEAL ECHOCARDIOGRAM (TEE);  Surgeon: Jacqueline Matsu, MD;  Location: [facility];  Service: Cardiovascular;  Laterality: N/A;   TRANSURETHRAL RESECTION OF BLADDER TUMOR N/A 06/16/2020   Procedure: TRANSURETHRAL RESECTION OF BLADDER TUMOR (TURBT) WITH GEMCITABINE ;  Surgeon: Geraline Knapp, MD;  Location: [facility];  Service: Urology;  Laterality: N/A;   VARICOSE VEIN SURGERY     VIDEO ASSISTED THORACOSCOPY Right 06/27/2014   Procedure: VIDEO ASSISTED THORACOSCOPY;  Surgeon: Norita Beauvais, MD;  Location: [facility] OR;  Service: Thoracic;  Laterality: Right;   VIDEO BRONCHOSCOPY N/A 06/27/2014   Procedure: VIDEO BRONCHOSCOPY;  Surgeon: Norita Beauvais, MD;  Location: [facility] OR;  Service: Thoracic;  Laterality: N/A;    No Known Allergies  Outpatient Encounter Medications as of 08/02/2023   Medication Sig   acetaminophen  (TYLENOL ) 500 MG tablet Take 500 mg by mouth every 6 (six) hours as needed for moderate pain.   albuterol  (PROVENTIL ) (2.5 MG/3ML) 0.083% nebulizer solution Take 3 mLs (2.5 mg total) by nebulization in the morning and at bedtime.   aspirin  EC 81 MG tablet Take 1 tablet (81 mg total) by mouth daily.   Calcium  Citrate-Vitamin D  (CALCIUM  CITRATE + D3 PO) Take 1 tablet by mouth daily.   Carboxymethylcellul-Glycerin (LUBRICATING EYE DROPS OP) Place 1 drop into both eyes 2 (two) times daily. As needed   carvedilol  (COREG ) 6.25 MG tablet Take 1 tablet (6.25 mg total) by mouth 2 (two) times daily with a meal.   FARXIGA  10 MG TABS tablet TAKE 1 TABLET(10 MG) BY MOUTH DAILY   Menthol , Topical Analgesic, (BIOFREEZE EX) Apply topically.   Multiple Vitamins-Minerals (SENIOR VITES PO) Take 1 tablet by mouth daily.   nitroGLYCERIN  (NITROSTAT ) 0.4 MG SL tablet Place 1 tablet (0.4 mg total) under the tongue every 5 (five) minutes as needed.  rosuvastatin  (CRESTOR ) 40 MG tablet TAKE 1 TABLET(40 MG) BY MOUTH DAILY   sodium chloride  HYPERTONIC 3 % nebulizer solution Take by nebulization in the morning and at bedtime.   tamsulosin  (FLOMAX ) 0.4 MG CAPS capsule TAKE 1 CAPSULE(0.4 MG) BY MOUTH DAILY   valsartan  (DIOVAN ) 80 MG tablet Take 1 tablet (80 mg total) by mouth daily.   traZODone  (DESYREL ) 50 MG tablet Take 50 mg by mouth at bedtime as needed for sleep. (Patient not taking: Reported on 08/02/2023)   No facility-administered encounter medications on file as of 08/02/2023.    Review of Systems:  Review of Systems  Health Maintenance  Topic Date Due   COVID-19 Vaccine (12 - 2024-25 season) 08/04/2023   INFLUENZA VACCINE  10/20/2023   Medicare Annual Wellness (AWV)  05/31/2024   DTaP/Tdap/Td (3 - Td or Tdap) 01/17/2033   Pneumonia Vaccine 76+ Years old  Completed   Hepatitis C Screening  Completed   Zoster Vaccines- Shingrix  Completed   HPV VACCINES  Aged Out    Meningococcal B Vaccine  Aged Out   Colonoscopy  Discontinued    Physical Exam: Vitals:   08/02/23 1531  BP: 124/70  Pulse: 67  Temp: 97.9 F (36.6 C)  SpO2: (!) 6%  Weight: 199 lb (90.3 kg)  Height: 6' (1.829 m)   Body mass index is 26.99 kg/m. Physical Exam Physical Exam   VITALS: BP- 124/70 EXTREMITIES: Pulses are strong. Calluses and bruising present on toes. Bruising around the toenail bed.      Labs reviewed: Basic Metabolic Panel: Recent Labs    08/12/22 0000 01/30/23 0804 05/18/23 0742  NA  --  139 142  K  --  5.3 4.6  CL  --  101 103  CO2  --  33* 31  GLUCOSE  --  115* 83  BUN 19 26* 25  CREATININE 1.3 1.43* 1.39*  CALCIUM   --  9.1 9.2  MG  --  2.3  --    Liver Function Tests: Recent Labs    01/30/23 0804  AST 18  ALT 21  BILITOT 0.6  PROT 7.5   No results for input(s): "LIPASE", "AMYLASE" in the last 8760 hours. No results for input(s): "AMMONIA" in the last 8760 hours. CBC: Recent Labs    08/12/22 0000 01/30/23 0804  WBC 11.5 10.1  NEUTROABS  --  7,595  HGB 14.6 14.9  HCT 45 44.7  MCV  --  96.8  PLT  --  202   Lipid Panel: Recent Labs    08/12/22 0000 01/30/23 0804  CHOL 95 110  HDL 47 50  LDLCALC 15 46  TRIG 69 63  CHOLHDL  --  2.2   Lab Results  Component Value Date   HGBA1C 5.9 (H) 01/30/2023    Procedures since last visit: ECHOCARDIOGRAM COMPLETE Result Date: 07/25/2023    ECHOCARDIOGRAM REPORT   Patient Name:   [name] [name] Date of Exam: 07/25/2023 Medical Rec #:  [id number]       Height:       72.0 in Accession #:    [id number]      Weight:       195.4 lb Date of Birth:  [birth year]       BSA:          2.110 m Patient Age:    78 years        BP:           118/58 mmHg Patient Gender: M  HR:           54 bpm. Exam Location:  [place] Procedure: 2D Echo, Cardiac Doppler, Color Doppler and Strain Analysis (Both            Spectral and Color Flow Doppler were utilized during procedure). Indications:    I25.5  Ischemic cardiomyopathy  History:        Patient has prior history of Echocardiogram examinations. CHF                 and Cardiomyopathy, CAD and Previous Myocardial Infarction,                 Abnormal ECG, COPD, Arrythmias:Atrial Fibrillation,                 Signs/Symptoms:Shortness of Breath and Dyspnea; Risk                 Factors:Hypertension and Dyslipidemia.  Sonographer:    Malena Scull RDMS, RVT, RDCS Referring Phys: [id number] [facility]  Sonographer Comments: Suboptimal apical window. IMPRESSIONS  1. Left ventricular ejection fraction, by estimation, is 30 to 35%. The left ventricle has moderately decreased function. The left ventricle demonstrates regional wall motion abnormalities (severe hypokinesis of the mid to distal anterior, anteroseptal,  inferior, anterolateral and apical regions). The left ventricular internal cavity size was mildly dilated. Left ventricular diastolic parameters are consistent with Grade I diastolic dysfunction (impaired relaxation). The average left ventricular global  longitudinal strain is -11.8 %. The global longitudinal strain is abnormal.  2. Right ventricular systolic function is normal. The right ventricular size is normal. Tricuspid regurgitation signal is inadequate for assessing PA pressure.  3. The mitral valve is normal in structure. Mild mitral valve regurgitation. No evidence of mitral stenosis.  4. The aortic valve is tricuspid. Aortic valve regurgitation is not visualized. Aortic valve sclerosis is present, with no evidence of aortic valve stenosis.  5. The inferior vena cava is normal in size with greater than 50% respiratory variability, suggesting right atrial pressure of 3 mmHg. FINDINGS  Left Ventricle: Left ventricular ejection fraction, by estimation, is 30 to 35%. The left ventricle has moderately decreased function. The left ventricle demonstrates regional wall motion abnormalities. The average left ventricular global longitudinal strain is -11.8  %. Strain was performed and the global longitudinal strain is abnormal. The left ventricular internal cavity size was mildly dilated. There is no left ventricular hypertrophy. Left ventricular diastolic parameters are consistent with Grade I diastolic dysfunction (impaired relaxation). Right Ventricle: The right ventricular size is normal. No increase in right ventricular wall thickness. Right ventricular systolic function is normal. Tricuspid regurgitation signal is inadequate for assessing PA pressure. Left Atrium: Left atrial size was normal in size. Right Atrium: Right atrial size was normal in size. Pericardium: There is no evidence of pericardial effusion. Mitral Valve: The mitral valve is normal in structure. Mild mitral annular calcification. Mild mitral valve regurgitation. No evidence of mitral valve stenosis. Tricuspid Valve: The tricuspid valve is normal in structure. Tricuspid valve regurgitation is not demonstrated. No evidence of tricuspid stenosis. Aortic Valve: The aortic valve is tricuspid. Aortic valve regurgitation is not visualized. Aortic valve sclerosis is present, with no evidence of aortic valve stenosis. Aortic valve mean gradient measures 2.0 mmHg. Aortic valve peak gradient measures 4.6  mmHg. Aortic valve area, by VTI measures 4.38 cm. Pulmonic Valve: The pulmonic valve was normal in structure. Pulmonic valve regurgitation is not visualized. No evidence of pulmonic stenosis. Aorta: The aortic root is  normal in size and structure. Venous: The inferior vena cava is normal in size with greater than 50% respiratory variability, suggesting right atrial pressure of 3 mmHg. IAS/Shunts: No atrial level shunt detected by color flow Doppler. Additional Comments: 3D was performed not requiring image post processing on an independent workstation and was indeterminate.  LEFT VENTRICLE PLAX 2D LVIDd:         5.92 cm   Diastology LVIDs:         4.21 cm   LV e' medial:    7.18 cm/s LV PW:         1.07  cm   LV E/e' medial:  9.7 LV IVS:        1.05 cm   LV e' lateral:   7.94 cm/s LVOT diam:     2.60 cm   LV E/e' lateral: 8.8 LV SV:         95 LV SV Index:   45        2D Longitudinal Strain LVOT Area:     5.31 cm  2D Strain GLS Avg:     -11.8 %                           3D Volume EF:                          3D EF:        25 % RIGHT VENTRICLE RV Basal diam:  4.10 cm RV Mid diam:    3.22 cm RV S prime:     14.80 cm/s TAPSE (M-mode): 2.6 cm LEFT ATRIUM             Index        RIGHT ATRIUM           Index LA diam:        3.90 cm 1.85 cm/m   RA Area:     13.20 cm LA Vol (A2C):   71.0 ml 33.66 ml/m  RA Volume:   30.80 ml  14.60 ml/m LA Vol (A4C):   53.4 ml 25.31 ml/m LA Biplane Vol: 62.0 ml 29.39 ml/m  AORTIC VALVE AV Area (Vmax):    4.15 cm AV Area (Vmean):   3.95 cm AV Area (VTI):     4.38 cm AV Vmax:           107.00 cm/s AV Vmean:          70.600 cm/s AV VTI:            0.217 m AV Peak Grad:      4.6 mmHg AV Mean Grad:      2.0 mmHg LVOT Vmax:         83.60 cm/s LVOT Vmean:        52.500 cm/s LVOT VTI:          0.179 m LVOT/AV VTI ratio: 0.82  AORTA Ao Sinus diam: 3.21 cm Ao Asc diam:   3.30 cm MITRAL VALVE MV Area (PHT): 2.73 cm     SHUNTS MV Decel Time: 278 msec     Systemic VTI:  0.18 m MV E velocity: 69.70 cm/s   Systemic Diam: 2.60 cm MV A velocity: 117.00 cm/s MV E/A ratio:  0.60 Belva Boyden MD Electronically signed by Belva Boyden MD Signature Date/Time: 07/25/2023/10:39:54 AM    Final    Results   DIAGNOSTIC Echocardiogram: Ejection fraction 30-35%, normal  variability of inferior vena cava      Assessment/Plan     Heart failure with reduced ejection fraction Chronic heart failure with reduced ejection fraction, ejection fraction at 30-35% on recent echocardiogram. Currently euvolemic with no signs of volume overload. Medically optimized with carvedilol , Farxiga , and valsartan . Emphasized the importance of a low salt diet and daily weight monitoring to detect fluid retention and the  role of medications in heart remodeling. - Continue carvedilol , Farxiga , and valsartan  as prescribed. - Maintain a low salt diet. - Monitor weight daily. - Follow up with heart clinic on Tuesday.  Myocardial infarction Myocardial infarction in 2019 with stent placement. Managed with statin therapy and aspirin . No new cardiac symptoms. Discussed the chronic nature of heart conditions and potential for changes over time. - Continue current statin therapy and aspirin . - Follow up with cardiologist as scheduled.  Easy bruising due to aspirin  use Reports easy bruising and prolonged healing of scrapes, likely related to aspirin  use. Advised that aspirin  can increase risk of bleeding and bruising. - Discuss any significant changes with dermatologist during annual visit in September.  Toenail bruising and calluses Soreness and bruising of big toes likely due to ill-fitting shoes. Long, thick toenails contributing to discomfort. Calluses present, previously evaluated by podiatrist. No signs of infection. - Soak feet in lukewarm water  with Epsom salts to reduce inflammation. - Consider purchasing shoes with a wider toe box to prevent further bruising.     COPD  Follows with pulmonology. Stable with current regimen.    Labs/tests ordered:  * No order type specified * Next appt:  11/03/2023   I spent greater than 30 minutes for the care of this patient in face to face time, chart review, clinical documentation, patient education.

## 2023-08-04 ENCOUNTER — Encounter: Payer: Self-pay | Admitting: Urology

## 2023-08-04 ENCOUNTER — Ambulatory Visit: Payer: Self-pay | Admitting: Urology

## 2023-08-04 VITALS — BP 124/79 | HR 51 | Ht 72.0 in | Wt 197.0 lb

## 2023-08-04 DIAGNOSIS — Z8551 Personal history of malignant neoplasm of bladder: Secondary | ICD-10-CM | POA: Diagnosis not present

## 2023-08-04 LAB — URINALYSIS, COMPLETE
Bilirubin, UA: NEGATIVE
Ketones, UA: NEGATIVE
Leukocytes,UA: NEGATIVE
Nitrite, UA: NEGATIVE
Protein,UA: NEGATIVE
RBC, UA: NEGATIVE
Specific Gravity, UA: 1.01 (ref 1.005–1.030)
Urobilinogen, Ur: 0.2 mg/dL (ref 0.2–1.0)
pH, UA: 6 (ref 5.0–7.5)

## 2023-08-04 LAB — MICROSCOPIC EXAMINATION
Bacteria, UA: NONE SEEN
RBC, Urine: NONE SEEN /HPF (ref 0–2)

## 2023-08-04 MED ORDER — SULFAMETHOXAZOLE-TRIMETHOPRIM 800-160 MG PO TABS
1.0000 | ORAL_TABLET | Freq: Once | ORAL | Status: AC
  Administered 2023-08-04: 1 via ORAL

## 2023-08-04 NOTE — Progress Notes (Signed)
   08/04/23  CC:  No chief complaint on file.   Urologic history: 1.  Urothelial carcinoma bladder TURBT 06/16/2020 < 2 cm papillary bladder tumor Post resection intravesical gemcitabine  Pathology low-grade, noninvasive urothelial carcinoma bladder   HPI: No complaints since last year's visit.  UA today negative WBC/RBC  Refer to rooming tab for vitals NED. A&Ox3.   No respiratory distress   Abd soft, NT, ND Normal phallus with bilateral descended testicles  Cystoscopy Procedure Note  Patient identification was confirmed, informed consent was obtained, and patient was prepped using Betadine solution.  Lidocaine  jelly was administered per urethral meatus.     Pre-Procedure: - Inspection reveals a normal caliber urethral meatus.  Procedure: The flexible cystoscope was introduced without difficulty - No urethral strictures/lesions are present. - Prominent lateral lobe enlargement prostate  - Moderate elevation bladder neck - Bilateral ureteral orifices identified - Bladder mucosa  reveals no ulcers, tumors, or lesions - No bladder stones - moderate trabeculation, scattered diverticula  Retroflexion shows no abnormalities   Post-Procedure: - Patient tolerated the procedure well  Assessment/ Plan: No evidence recurrent urothelial carcinoma Schedule surveillance cystoscopy 1 year History post cystoscopy UTI and he was given Septra  DS 1 tab pre-procedure   Geraline Knapp, MD

## 2023-08-07 ENCOUNTER — Telehealth: Payer: Self-pay | Admitting: Family

## 2023-08-07 NOTE — Telephone Encounter (Signed)
 Called to confirm/remind patient of their appointment [facility] on 08/08/[facility].   Appointment:   [x] Confirmed  [] Left mess   [] No answer/No voice mail  [] VM Full/unable to leave message  [] Phone not in service  Patient reminded to bring all medications and/or complete list.  Confirmed patient has transportation. Gave directions, instructed to utilize valet parking.

## 2023-08-07 NOTE — Progress Notes (Signed)
 [facility] Note   Referring Physician: Constancia Delton, MD PCP: Valrie Gehrig, MD (last seen 05/25) Cardiologist: Constancia Delton, MD (last seen 03/25)  Chief Complaint: shortness of breath   HPI:  [name] [name] is a 78 y/o male kindly referred by Dr [name] [name]. He has a history of MI w/ stent in 2019, depression, anxiety, bronchiectasis, empyema 2016, HTN, hyperlipidemia, PAF, pulmonary nodule and chronic heart failure. No admissions in the last few years.   TEE 07/17/14: EF 55-60%, mild [name]  LHC 09/27/17: 1. Acute anterior STEMI secondary to occlusion of the mid LAD 2. Successful PTCA/DES x 1 mid LAD 3. Successful PTCA/angioplasty ostium of the Diagonal branch.  4. Mild non-obstructive disease in the RCA, circumflex and mid LAD  Echo 02/22/19: EF 40-45%, moderate LVH, G1DD, normal RV, moderate LAE Echo 11/03/20: EF 40-45%, mild LVH, G1DD, mild LAE, normal RV, mild [name] Echo 07/25/23: EF 30-35%, G1DD, normal RV, mild [name], regional wall motion abnormalities with severe hypokinesis  He presents today, with his wife, for his initial HF visit with a chief complaint of minimal SOB. Sleeping well on 2 pillows. Denies fatigue, chest pain, palpitations, abdominal distention, pedal edema, dizziness or difficulty sleeping. Walks on treadmill 3/ week for 1/2 hour each time along with strength training.   Was on entresto  in the past and it made his bronchiectasis worse.   Review of Systems: [y] = yes, [ ]  = no   General: Weight gain [ ] ; Weight loss [ ] ; Anorexia [ ] ; Fatigue [ ] ; Fever [ ] ; Chills [ ] ; Weakness [ ]   Cardiac: Chest pain/pressure [ ] ; Resting SOB [ ] ; Exertional SOB [ y]; Orthopnea [ ] ; Pedal Edema [ ] ; Palpitations [ ] ; Syncope [ ] ; Presyncope [ ] ; Paroxysmal nocturnal dyspnea[ ]   Pulmonary: Cough [ ] ; Wheezing[ ] ; Hemoptysis[ ] ; Sputum [ ] ; Snoring [ ]   GI: Vomiting[ ] ; Dysphagia[ ] ; Melena[ ] ; Hematochezia [ ] ; Heartburn[ ] ; Abdominal pain [ ] ; Constipation [  ]; Diarrhea [ ] ; BRBPR [ ]   GU: Hematuria[ ] ; Dysuria [ ] ; Nocturia[ ]   Vascular: Pain in legs with walking [ ] ; Pain in feet with lying flat [ ] ; Non-healing sores [ ] ; Stroke [ ] ; TIA [ ] ; Slurred speech [ ] ;  Neuro: Headaches[ ] ; Vertigo[ ] ; Seizures[ ] ; Paresthesias[ ] ;Blurred vision [ ] ; Diplopia [ ] ; Vision changes [ ]   Ortho/Skin: Arthritis [ ] ; Joint pain [ ] ; Muscle pain [ ] ; Joint swelling [ ] ; Back Pain [ ] ; Rash [ ]   Psych: Depression[ ] ; Anxiety[y ]  Heme: Bleeding problems [ ] ; Clotting disorders [ ] ; Anemia [ ]   Endocrine: Diabetes [ ] ; Thyroid  dysfunction[ ]    Past Medical History:  Diagnosis Date   Acute systolic heart failure (HCC)    Anxiety    COPD (chronic obstructive pulmonary disease) (HCC)    Enlarged prostate with lower urinary tract symptoms (LUTS)    GERD (gastroesophageal reflux disease)    Hyperlipidemia    Hypertension    Low serum testosterone  level    PAF (paroxysmal atrial fibrillation) (HCC)    Pneumonia 05/2014   Pyothorax without fistula (HCC)    Sciatica    Shortness of breath dyspnea    Solitary pulmonary nodule    STEMI (ST elevation myocardial infarction) (HCC)    09/27/17 PCI/DES x1 mLAD, PTCA of the diag branch. EF 30% Life[facility] at discharge.    Testicular hypofunction    Urinary frequency     Current Outpatient Medications  Medication Sig Dispense Refill   acetaminophen  (TYLENOL ) 500 MG tablet Take 500 mg by mouth every 6 (six) hours as needed for moderate pain.     albuterol  (PROVENTIL ) (2.5 MG/3ML) 0.083% nebulizer solution Take 3 mLs (2.5 mg total) by nebulization in the morning and at bedtime. 250 mL 11   aspirin  EC 81 MG tablet Take 1 tablet (81 mg total) by mouth daily. 90 tablet 3   Calcium  Citrate-Vitamin D  (CALCIUM  CITRATE + D3 PO) Take 1 tablet by mouth daily.     Carboxymethylcellul-Glycerin (LUBRICATING EYE DROPS OP) Place 1 drop into both eyes 2 (two) times daily. As needed     carvedilol  (COREG ) 6.25 MG tablet Take 1 tablet  (6.25 mg total) by mouth 2 (two) times daily with a meal. 180 tablet 3   FARXIGA  10 MG TABS tablet TAKE 1 TABLET(10 MG) BY MOUTH DAILY 90 tablet 3   Menthol , Topical Analgesic, (BIOFREEZE EX) Apply topically.     Multiple Vitamins-Minerals (SENIOR VITES PO) Take 1 tablet by mouth daily.     nitroGLYCERIN  (NITROSTAT ) 0.4 MG SL tablet Place 1 tablet (0.4 mg total) under the tongue every 5 (five) minutes as needed. 25 tablet 3   rosuvastatin  (CRESTOR ) 40 MG tablet TAKE 1 TABLET(40 MG) BY MOUTH DAILY 90 tablet 3   sodium chloride  HYPERTONIC 3 % nebulizer solution Take by nebulization in the morning and at bedtime. 1500 mL 11   tamsulosin  (FLOMAX ) 0.4 MG CAPS capsule TAKE 1 CAPSULE(0.4 MG) BY MOUTH DAILY 90 capsule 1   valsartan  (DIOVAN ) 80 MG tablet Take 1 tablet (80 mg total) by mouth daily. 90 tablet 3   No current facility-administered medications for this visit.    No Known Allergies    Social History   Socioeconomic History   Marital status: Married    Spouse name: Not on file   Number of children: Not on file   Years of education: Not on file   Highest education level: Not on file  Occupational History   Not on file  Tobacco Use   Smoking status: Former    Current packs/day: 0.00    Average packs/day: 1 pack/day for 10.0 years (10.0 ttl pk-yrs)    Types: Pipe, Cigarettes    Start date: 06/06/2004    Quit date: 06/07/2014    Years since quitting: 9.1    Passive exposure: Past   Smokeless tobacco: Never   Tobacco comments:    smoked pipe for 40 years, daily  Vaping Use   Vaping status: Never Used  Substance and Sexual Activity   Alcohol use: Not Currently    Comment: Average 1-2 drinks of beer in the evening; 04/29/20 none since 01/2020   Drug use: No   Sexual activity: Not on file  Other Topics Concern   Not on file  Social History Narrative   Not on file   Social Drivers of Health   Financial Resource Strain: Not on file  Food Insecurity: No Food Insecurity  (06/01/2023)   Hunger Vital Sign    Worried About Running Out of Food in the Last Year: Never true    Ran Out of Food in the Last Year: Never true  Transportation Needs: No Transportation Needs (06/01/2023)   PRAPARE - Administrator, Civil Service (Medical): No    Lack of Transportation (Non-Medical): No  Physical Activity: Not on file  Stress: Not on file  Social Connections: Not on file  Intimate Partner Violence: Not At Risk (06/01/2023)  Humiliation, Afraid, Rape, and Kick questionnaire    Fear of Current or Ex-Partner: No    Emotionally Abused: No    Physically Abused: No    Sexually Abused: No      Family History  Problem Relation Age of Onset   Tuberculosis Father    Hypertension Father    COPD Mother    Hypertension Mother    Colon cancer Neg Hx    Vitals:   08/08/23 1329  BP: 133/68  Pulse: 62  SpO2: 97%  Weight: 198 lb 8 oz (90 kg)   Wt Readings from Last 3 Encounters:  08/08/23 198 lb 8 oz (90 kg)  08/04/23 197 lb (89.4 kg)  08/02/23 199 lb (90.3 kg)   Lab Results  Component Value Date   CREATININE 1.39 (H) 05/18/2023   CREATININE 1.43 (H) 01/30/2023   CREATININE 1.3 08/12/2022    PHYSICAL EXAM:  General: Well appearing. No resp difficulty HEENT: normal Neck: supple, no JVD Cor: Regular rhythm, rate. No rubs, gallops or murmurs Lungs: clear Abdomen: soft, nontender, nondistended. Extremities: no cyanosis, clubbing, rash, edema Neuro: alert & oriented X 3. Moves all 4 extremities w/o difficulty. Affect pleasant   ECG: NSR, HR 61 (personally reviewed)   ASSESSMENT & PLAN:  1: Ischemic heart failure with reduced ejection fraction- - MI 2019 with PTCA/ DES to mid LAD - NYHA class II - euvolemic - weighing daily; parameters to call about discussed - TEE 07/17/14: EF 55-60%, mild [name] - Echo 02/22/19: EF 40-45%, moderate LVH, G1DD, normal RV, moderate LAE - Echo 11/03/20: EF 40-45%, mild LVH, G1DD, mild LAE, normal RV, mild [name] - Echo  07/25/23: EF 30-35%, G1DD, normal RV, mild [name], regional wall motion abnormalities with severe hypokinesis - continue carvedilol  6.25mg  BID - continue farxiga  10mg  daily - continue valsartan  80mg  daily (bronchiectasis worse w/ entresto ) - discussed MRA but they are leaving tomorrow to go to [facility] and won't be back until August; does not have routine provider there - will consider adding this once he returns locally so that blood work can be closely monitored - will do cMRI to better evaluate heart function/ damage but this will be scheduled for August after he returns - continue exercise - BNP 02/21/22 was 95.9  2: HTN- - BP 133/68 - saw PCP ([facility]) 05/25 - BMET 05/18/23 reviewed: sodium 142, potassium 4.6, creatinine 1.39 and GFR 52 - BMET today  3: CAD- - saw cardiology (Agbor-Etang) 03/25 - continue ASA 81mg  daily - LHC 09/27/17:  1. Acute anterior STEMI secondary to occlusion of the mid LAD  2. Successful PTCA/DES x 1 mid LAD  3. Successful PTCA/angioplasty ostium of the Diagonal branch.   4. Mild non-obstructive disease in the RCA, circumflex and mid LAD  4: Hyperlipidemia- - LDL 01/30/23 was 46 - continue rosuvastatin  40mg  daily  5: PAF- - EKG today is NSR   Return 08/25 when he returns, sooner if needed.   Charlette Console, FNP 08/07/23

## 2023-08-08 ENCOUNTER — Ambulatory Visit: Attending: Family | Admitting: Family

## 2023-08-08 ENCOUNTER — Encounter: Payer: Self-pay | Admitting: Family

## 2023-08-08 VITALS — BP 133/68 | HR 62 | Wt 198.5 lb

## 2023-08-08 DIAGNOSIS — E785 Hyperlipidemia, unspecified: Secondary | ICD-10-CM | POA: Diagnosis not present

## 2023-08-08 DIAGNOSIS — Z7984 Long term (current) use of oral hypoglycemic drugs: Secondary | ICD-10-CM | POA: Insufficient documentation

## 2023-08-08 DIAGNOSIS — I5022 Chronic systolic (congestive) heart failure: Secondary | ICD-10-CM | POA: Diagnosis not present

## 2023-08-08 DIAGNOSIS — I10 Essential (primary) hypertension: Secondary | ICD-10-CM | POA: Diagnosis not present

## 2023-08-08 DIAGNOSIS — Z79899 Other long term (current) drug therapy: Secondary | ICD-10-CM | POA: Diagnosis not present

## 2023-08-08 DIAGNOSIS — I2510 Atherosclerotic heart disease of native coronary artery without angina pectoris: Secondary | ICD-10-CM | POA: Insufficient documentation

## 2023-08-08 DIAGNOSIS — I480 Paroxysmal atrial fibrillation: Secondary | ICD-10-CM | POA: Diagnosis not present

## 2023-08-08 DIAGNOSIS — Z955 Presence of coronary angioplasty implant and graft: Secondary | ICD-10-CM | POA: Insufficient documentation

## 2023-08-08 DIAGNOSIS — E782 Mixed hyperlipidemia: Secondary | ICD-10-CM | POA: Diagnosis not present

## 2023-08-08 DIAGNOSIS — Z7982 Long term (current) use of aspirin: Secondary | ICD-10-CM | POA: Diagnosis not present

## 2023-08-08 DIAGNOSIS — J479 Bronchiectasis, uncomplicated: Secondary | ICD-10-CM | POA: Insufficient documentation

## 2023-08-08 DIAGNOSIS — I252 Old myocardial infarction: Secondary | ICD-10-CM | POA: Diagnosis not present

## 2023-08-08 DIAGNOSIS — I110 Hypertensive heart disease with heart failure: Secondary | ICD-10-CM | POA: Diagnosis not present

## 2023-08-08 DIAGNOSIS — Z87891 Personal history of nicotine dependence: Secondary | ICD-10-CM | POA: Diagnosis not present

## 2023-08-08 NOTE — Patient Instructions (Signed)
 Medication Changes:  No medication changes today!  Lab Work:  Go DOWN to LOWER LEVEL (LL) to have your blood work completed inside of [employer] office.   We will only call you if the results are abnormal or if the provider would like to make medication changes.   Testing/Procedures:   SOMEONE WILL CONTACT YOU IN ORDER TO SCHEDULE THIS APPOINTMENT  [facility] [address] [place], [place] [address] Please go to the [facility] and check-in with the desk attendant.   Magnetic resonance imaging (MRI) is a painless test that produces images of the inside of the body without using Xrays.  During an MRI, strong magnets and radio waves work together in a Data processing manager to form detailed images.   MRI images may provide more details about a medical condition than X-rays, CT scans, and ultrasounds can provide.  You may be given earphones to listen for instructions.  You may eat a light breakfast and take medications as ordered with the exception of furosemide , hydrochlorothiazide, chlorthalidone or spironolactone  (or any other fluid pill). If you are undergoing a stress MRI, please avoid stimulants for 12 hr prior to test. (I.e. Caffeine, nicotine, chocolate, or antihistamine medications)  If your provider has ordered anti-anxiety medications for this test, then you will need a driver.  An IV will be inserted into one of your veins. Contrast material will be injected into your IV. It will leave your body through your urine within a day. You may be told to drink plenty of fluids to help flush the contrast material out of your system.  You will be asked to remove all metal, including: Watch, jewelry, and other metal objects including hearing aids, hair pieces and dentures. Also wearable glucose monitoring systems (ie. Freestyle Libre and Omnipods) (Braces and fillings normally are not a problem.)   TEST WILL TAKE APPROXIMATELY 1 HOUR   Follow-Up in:  Please follow up with the [facility] in 1 week after your cardiac mri.  At the [facility], you and your health needs are our priority. We have a designated team specialized in the treatment of Heart Failure. This Care Team includes your primary Heart Failure Specialized Cardiologist (physician), Advanced Practice Providers (APPs- Physician Assistants and Nurse Practitioners), and Pharmacist who all work together to provide you with the care you need, when you need it.   You may see any of the following providers on your designated Care Team at your next follow up:  Dr. Jules Oar Dr. Peder Bourdon Dr. Alwin Baars Dr. Judyth Nunnery Shawnee Dellen, FNP Bevely Brush, RPH-CPP  Please be sure to bring in all your medications bottles to every appointment.   Need to Contact Us :  If you have any questions or concerns before your next appointment please send us  a message through [employer] or call our office at [phone].    TO LEAVE A MESSAGE FOR THE NURSE SELECT OPTION 2, PLEASE LEAVE A MESSAGE INCLUDING: YOUR NAME DATE OF BIRTH CALL BACK NUMBER REASON FOR CALL**this is important as we prioritize the call backs  YOU WILL RECEIVE A CALL BACK THE SAME DAY AS LONG AS YOU CALL BEFORE 4:00 PM

## 2023-08-09 ENCOUNTER — Ambulatory Visit: Payer: Self-pay | Admitting: Family

## 2023-08-09 LAB — BASIC METABOLIC PANEL WITH GFR
BUN/Creatinine Ratio: 16 (ref 10–24)
BUN: 34 mg/dL — ABNORMAL HIGH (ref 8–27)
CO2: 23 mmol/L (ref 20–29)
Calcium: 9.3 mg/dL (ref 8.6–10.2)
Chloride: 102 mmol/L (ref 96–106)
Creatinine, Ser: 2.08 mg/dL — ABNORMAL HIGH (ref 0.76–1.27)
Glucose: 79 mg/dL (ref 70–99)
Potassium: 4.6 mmol/L (ref 3.5–5.2)
Sodium: 142 mmol/L (ref 134–144)
eGFR: 32 mL/min/{1.73_m2} — ABNORMAL LOW (ref >59)

## 2023-10-02 ENCOUNTER — Encounter: Payer: Self-pay | Admitting: Family

## 2023-10-23 DIAGNOSIS — H903 Sensorineural hearing loss, bilateral: Secondary | ICD-10-CM | POA: Diagnosis not present

## 2023-10-24 ENCOUNTER — Other Ambulatory Visit
Admission: RE | Admit: 2023-10-24 | Discharge: 2023-10-24 | Disposition: A | Discharge status: Home / Self Care | Attending: Family | Admitting: Family

## 2023-10-24 DIAGNOSIS — I5022 Chronic systolic (congestive) heart failure: Secondary | ICD-10-CM | POA: Insufficient documentation

## 2023-10-24 LAB — CBC
HCT: 42 % (ref 39.0–52.0)
Hemoglobin: 13.8 g/dL (ref 13.0–17.0)
MCH: 30.8 pg (ref 26.0–34.0)
MCHC: 32.9 g/dL (ref 30.0–36.0)
MCV: 93.8 fL (ref 80.0–100.0)
Platelets: 263 K/uL (ref 150–400)
RBC: 4.48 MIL/uL (ref 4.22–5.81)
RDW: 12.7 % (ref 11.5–15.5)
WBC: 7.8 K/uL (ref 4.0–10.5)
nRBC: 0 % (ref 0.0–0.2)

## 2023-10-27 ENCOUNTER — Encounter (HOSPITAL_COMMUNITY): Payer: Self-pay

## 2023-10-30 ENCOUNTER — Other Ambulatory Visit

## 2023-10-30 DIAGNOSIS — E1169 Type 2 diabetes mellitus with other specified complication: Secondary | ICD-10-CM | POA: Diagnosis not present

## 2023-10-30 DIAGNOSIS — N1831 Chronic kidney disease, stage 3a: Secondary | ICD-10-CM | POA: Diagnosis not present

## 2023-10-30 DIAGNOSIS — E785 Hyperlipidemia, unspecified: Secondary | ICD-10-CM | POA: Diagnosis not present

## 2023-10-30 LAB — LIPID PANEL
Cholesterol: 195 mg/dL (ref <200)
HDL: 72 mg/dL (ref >=40)
LDL Cholesterol (Calc): 108 mg/dL — ABNORMAL HIGH
Non-HDL Cholesterol (Calc): 123 mg/dL (ref <130)
Total CHOL/HDL Ratio: 2.7 (calc) (ref <5.0)
Triglycerides: 66 mg/dL (ref <150)

## 2023-10-30 LAB — COMPLETE METABOLIC PANEL WITHOUT GFR
AG Ratio: 2.1 (calc) (ref 1.0–2.5)
ALT: 24 U/L (ref 9–46)
AST: 24 U/L (ref 10–35)
Albumin: 4.7 g/dL (ref 3.6–5.1)
Alkaline phosphatase (APISO): 76 U/L (ref 35–144)
BUN: 19 mg/dL (ref 7–25)
CO2: 29 mmol/L (ref 20–32)
Calcium: 9.3 mg/dL (ref 8.6–10.3)
Chloride: 102 mmol/L (ref 98–110)
Creat: 0.7 mg/dL (ref 0.70–1.28)
Globulin: 2.2 g/dL (ref 1.9–3.7)
Glucose, Bld: 76 mg/dL (ref 65–99)
Potassium: 4.7 mmol/L (ref 3.5–5.3)
Sodium: 139 mmol/L (ref 135–146)
Total Bilirubin: 0.4 mg/dL (ref 0.2–1.2)
Total Protein: 6.9 g/dL (ref 6.1–8.1)

## 2023-10-30 LAB — VITAMIN D 25 HYDROXY (VIT D DEFICIENCY, FRACTURES): Vit D, 25-Hydroxy: 56 ng/mL (ref 30–100)

## 2023-10-30 LAB — PTH, INTACT AND CALCIUM
Calcium: 9.3 mg/dL (ref 8.6–10.3)
PTH: 49 pg/mL (ref 16–77)

## 2023-10-30 LAB — PHOSPHORUS: Phosphorus: 3.9 mg/dL (ref 2.1–4.3)

## 2023-10-31 ENCOUNTER — Ambulatory Visit: Payer: Self-pay | Admitting: Student

## 2023-11-01 ENCOUNTER — Ambulatory Visit
Admission: RE | Admit: 2023-11-01 | Discharge: 2023-11-01 | Disposition: A | Discharge status: Home / Self Care | Source: Ambulatory Visit | Attending: Family | Admitting: Family

## 2023-11-01 ENCOUNTER — Other Ambulatory Visit: Payer: Self-pay | Admitting: Family

## 2023-11-01 DIAGNOSIS — I5022 Chronic systolic (congestive) heart failure: Secondary | ICD-10-CM | POA: Diagnosis present

## 2023-11-01 MED ORDER — GADOBUTROL 1 MMOL/ML IV SOLN
10.0000 mL | Freq: Once | INTRAVENOUS | Status: AC | PRN
  Administered 2023-11-01 (×2): 10 mL via INTRAVENOUS

## 2023-11-03 ENCOUNTER — Ambulatory Visit: Admitting: Student

## 2023-11-03 ENCOUNTER — Ambulatory Visit: Payer: Self-pay | Admitting: Family

## 2023-11-03 ENCOUNTER — Encounter: Payer: Self-pay | Admitting: Student

## 2023-11-03 VITALS — BP 126/72 | HR 61 | Temp 97.6°F | Ht 72.0 in | Wt 197.6 lb

## 2023-11-03 DIAGNOSIS — E785 Hyperlipidemia, unspecified: Secondary | ICD-10-CM | POA: Diagnosis not present

## 2023-11-03 DIAGNOSIS — I4891 Unspecified atrial fibrillation: Secondary | ICD-10-CM | POA: Diagnosis not present

## 2023-11-03 DIAGNOSIS — N400 Enlarged prostate without lower urinary tract symptoms: Secondary | ICD-10-CM

## 2023-11-03 DIAGNOSIS — N1831 Chronic kidney disease, stage 3a: Secondary | ICD-10-CM | POA: Diagnosis not present

## 2023-11-03 DIAGNOSIS — I5020 Unspecified systolic (congestive) heart failure: Secondary | ICD-10-CM

## 2023-11-03 DIAGNOSIS — J449 Chronic obstructive pulmonary disease, unspecified: Secondary | ICD-10-CM | POA: Diagnosis not present

## 2023-11-03 DIAGNOSIS — I739 Peripheral vascular disease, unspecified: Secondary | ICD-10-CM

## 2023-11-03 DIAGNOSIS — C679 Malignant neoplasm of bladder, unspecified: Secondary | ICD-10-CM | POA: Diagnosis not present

## 2023-11-03 NOTE — Patient Instructions (Signed)
 HEART FAILURE WITH REDUCED EJECTION FRACTION: Your heart condition is being managed, and you have a follow-up with your heart specialist scheduled.  -CHRONIC KIDNEY DISEASE: Your kidney function is stable after adjusting your medication. We will continue to monitor it.  -HYPERLIPIDEMIA: We need to recheck your levels before the next appointment. See if your cardiologist feels strongly we add a medication, I would prefer to repeat labs before next appointment.   -BLADDER CANCER, UNDER SURVEILLANCE: Your bladder cancer is under surveillance, and your last cystoscopy was negative. You need two more annual exams for a five-year clearance.  -MEMORY CHANGES, UNDER EVALUATION: You have noted some memory changes. We will consider memory exercises at your next visit.  -NIGHT SWEATS, POSSIBLE MEDICATION SIDE EFFECT: Your night sweats may be due to taking Tylenol  at night. Try lowering your room temperature and consider taking 1000 mg of Tylenol  at bedtime instead of splitting the dose. Let me know if these are more frequent.

## 2023-11-05 ENCOUNTER — Encounter: Payer: Self-pay | Admitting: Student

## 2023-11-05 NOTE — Progress Notes (Signed)
 [facility]: [facility] Provider: ABDUL  Code Status: Full Code Goals of Care:     11/03/2023   10:29 AM  Advanced Directives  Does Patient Have a Medical Advance Directive? Yes  Type of Advance Directive Out of facility DNR (pink MOST or yellow form)  Does patient want to make changes to medical advance directive? No - Patient declined     Chief Complaint  Patient presents with   Medical Management of Chronic Issues    Medical Management of Chronic Issues. 3 Month follow up labs.     HPI: Patient is a 78 y.o. male seen today for medical management of chronic diseases.   Discussed the use of AI scribe software for clinical note transcription with the patient, who gave verbal consent to proceed.  Patient states he is doing well.  They sleep with the temp around 71 degrees at night.   Cholesterol slightly elevated  from before despite his medication regimen compliance.  He has been having night sweats in the last few months. He has been taking 500 mg tylenol   Urinates every 4 hours - even overnight. Sometimes takes a second tylenol  500 mg in the middle of the night.   Reviewed blood work. He has some concerns about his memory.   BP med was lowered with cardiologist-- kidney function back to normal.   LDL is slightly elevated.   Past Medical History:  Diagnosis Date   Acute systolic heart failure (HCC)    Anxiety    COPD (chronic obstructive pulmonary disease) (HCC)    Enlarged prostate with lower urinary tract symptoms (LUTS)    GERD (gastroesophageal reflux disease)    Hyperlipidemia    Hypertension    Low serum testosterone  level    PAF (paroxysmal atrial fibrillation) (HCC)    Pneumonia 05/2014   Pyothorax without fistula (HCC)    Sciatica    Shortness of breath dyspnea    Solitary pulmonary nodule    STEMI (ST elevation myocardial infarction) (HCC)    09/27/17 PCI/DES x1 mLAD, PTCA of the diag branch. EF 30% Lifevest at discharge.     Testicular hypofunction    Urinary frequency     Past Surgical History:  Procedure Laterality Date   APPENDECTOMY     BACK SURGERY     COLONOSCOPY  2011   Tubular adenoma per patient   COLONOSCOPY N/A 03/03/2015   Dr. Harvey: Left colon is redundant, mild diverticulosis, small internal hemorrhoids, moderate external hemorrhoids.  Next colonoscopy in 5 years with overtube.   COLONOSCOPY N/A 03/25/2019   Procedure: COLONOSCOPY;  Surgeon: Harvey Margo CROME, MD;  [facility]: [facility];  Service: Endoscopy;  Laterality: N/A;  8:30am - Camille's request   CORONARY ULTRASOUND/IVUS N/A 09/27/2017   Procedure: Intravascular Ultrasound/IVUS;  Surgeon: Verlin Lonni BIRCH, MD;  Location: [facility];  Service: Cardiovascular;  Laterality: N/A;   CORONARY/GRAFT ACUTE MI REVASCULARIZATION N/A 09/27/2017   Procedure: Coronary/Graft Acute MI Revascularization;  Surgeon: Verlin Lonni BIRCH, MD;  Location: [facility];  Service: Cardiovascular;  Laterality: N/A;   CYSTOSCOPY W/ RETROGRADES Bilateral 06/16/2020   Procedure: CYSTOSCOPY WITH RETROGRADE PYELOGRAM;  Surgeon: Twylla Glendia BROCKS, MD;  Location: [facility];  Service: Urology;  Laterality: Bilateral;   EMPYEMA DRAINAGE Right 06/27/2014   Procedure: EMPYEMA DRAINAGE;  Surgeon: Dallas KATHEE Jude, MD;  [facility];  Service: Thoracic;  Laterality: Right;   HEMORRHOID BANDING N/A 03/03/2015   Procedure: HEMORRHOID BANDING;  Surgeon: Margo LITTIE Haddock, MD;  Location: [facility];  Service: Endoscopy;  Laterality: N/A;  Recomended to see a Careers adviser.   HERNIA REPAIR     LEFT HEART CATH AND CORONARY ANGIOGRAPHY N/A 09/27/2017   Procedure: LEFT HEART CATH AND CORONARY ANGIOGRAPHY;  Surgeon: Verlin Lonni BIRCH, MD;  Location: [facility];  Service: Cardiovascular;  Laterality: N/A;   LUNG SURGERY  2016   POLYPECTOMY  03/25/2019   Procedure: POLYPECTOMY;  Surgeon: Haddock Margo LITTIE, MD;  [facility]: [facility];  Service: Endoscopy;;    TEE WITHOUT CARDIOVERSION N/A 07/14/2014   Procedure: TRANSESOPHAGEAL ECHOCARDIOGRAM (TEE);  Surgeon: Wilbert JONELLE Bihari, MD;  Location: [facility];  Service: Cardiovascular;  Laterality: N/A;   TRANSURETHRAL RESECTION OF BLADDER TUMOR N/A 06/16/2020   Procedure: TRANSURETHRAL RESECTION OF BLADDER TUMOR (TURBT) WITH GEMCITABINE ;  Surgeon: Twylla Glendia BROCKS, MD;  Location: [facility];  Service: Urology;  Laterality: N/A;   VARICOSE VEIN SURGERY     VIDEO ASSISTED THORACOSCOPY Right 06/27/2014   Procedure: VIDEO ASSISTED THORACOSCOPY;  Surgeon: Dallas KATHEE Jude, MD;  Location: [facility] OR;  Service: Thoracic;  Laterality: Right;   VIDEO BRONCHOSCOPY N/A 06/27/2014   Procedure: VIDEO BRONCHOSCOPY;  Surgeon: Dallas KATHEE Jude, MD;  Location: [facility] OR;  Service: Thoracic;  Laterality: N/A;    No Known Allergies  Outpatient Encounter Medications as of 11/03/2023  Medication Sig   acetaminophen  (TYLENOL ) 500 MG tablet Take 500 mg by mouth every 6 (six) hours as needed for moderate pain.   albuterol  (PROVENTIL ) (2.5 MG/3ML) 0.083% nebulizer solution Take 3 mLs (2.5 mg total) by nebulization in the morning and at bedtime.   aspirin  EC 81 MG tablet Take 1 tablet (81 mg total) by mouth daily.   Calcium  Citrate-Vitamin D  (CALCIUM  CITRATE + D3 PO) Take 1 tablet by mouth daily.   Carboxymethylcellul-Glycerin (LUBRICATING EYE DROPS OP) Place 1 drop into both eyes 2 (two) times daily. As needed   carvedilol  (COREG ) 6.25 MG tablet Take 1 tablet (6.25 mg total) by mouth 2 (two) times daily with a meal.   FARXIGA  10 MG TABS tablet TAKE 1 TABLET(10 MG) BY MOUTH DAILY   Menthol , Topical Analgesic, (BIOFREEZE EX) Apply topically.   Multiple Vitamins-Minerals (SENIOR VITES PO) Take 1 tablet by mouth daily.   nitroGLYCERIN  (NITROSTAT ) 0.4 MG SL tablet Place 1 tablet (0.4 mg total) under the tongue every 5 (five) minutes as needed.   rosuvastatin  (CRESTOR ) 40 MG tablet TAKE 1 TABLET(40 MG) BY MOUTH DAILY   sodium chloride  HYPERTONIC 3  % nebulizer solution Take by nebulization in the morning and at bedtime.   tamsulosin  (FLOMAX ) 0.4 MG CAPS capsule TAKE 1 CAPSULE(0.4 MG) BY MOUTH DAILY   valsartan  (DIOVAN ) 80 MG tablet Take 1 tablet (80 mg total) by mouth daily. (Patient taking differently: Take 40 mg by mouth daily.)   No facility-administered encounter medications on file as of 11/03/2023.    Review of Systems:  Review of Systems  Health Maintenance  Topic Date Due   COVID-19 Vaccine (12 - 2024-25 season) 08/04/2023   INFLUENZA VACCINE  10/20/2023   Medicare Annual Wellness (AWV)  05/31/2024   DTaP/Tdap/Td (3 - Td or Tdap) 01/17/2033   Pneumococcal Vaccine: 50+ Years  Completed   Hepatitis C Screening  Completed   Zoster Vaccines- Shingrix  Completed   HPV VACCINES  Aged Out   Meningococcal B Vaccine  Aged Out   Colonoscopy  Discontinued    Physical Exam: Vitals:   11/03/23 1026  BP: 126/72  Pulse: 61  Temp: 97.6 F (36.4 C)  SpO2: 98%  Weight: 197 lb 9.6 oz (89.6 kg)  Height: 6' (1.829 m)   Body mass index is 26.8 kg/m. Physical Exam Constitutional:      Appearance: Normal appearance.  Cardiovascular:     Rate and Rhythm: Normal rate and regular rhythm.     Pulses: Normal pulses.     Heart sounds: Normal heart sounds.  Pulmonary:     Effort: Pulmonary effort is normal.  Abdominal:     General: Abdomen is flat. Bowel sounds are normal.     Palpations: Abdomen is soft.  Musculoskeletal:        General: No swelling or tenderness.  Skin:    General: Skin is warm and dry.     Capillary Refill: Capillary refill takes 2 to 3 seconds.  Neurological:     Mental Status: He is alert and oriented to person, place, and time.     Gait: Gait normal.  Psychiatric:        Mood and Affect: Mood normal.    Physical Exam   Labs reviewed: Basic Metabolic Panel: Recent Labs    01/30/23 0804 05/18/23 0742 08/08/23 1435 10/30/23 0734  NA 139 142 142 139  K 5.3 4.6 4.6 4.7  CL 101 103 102 102   CO2 33* 31 23 29   GLUCOSE 115* 83 79 76  BUN 26* 25 34* 19  CREATININE 1.43* 1.39* 2.08* 0.70  CALCIUM  9.1 9.2 9.3 9.3  9.3  MG 2.3  --   --   --   PHOS  --   --   --  3.9   Liver Function Tests: Recent Labs    01/30/23 0804 10/30/23 0734  AST 18 24  ALT 21 24  BILITOT 0.6 0.4  PROT 7.5 6.9   No results for input(s): LIPASE, AMYLASE in the last 8760 hours. No results for input(s): AMMONIA in the last 8760 hours. CBC: Recent Labs    01/30/23 0804 10/24/23 1118  WBC 10.1 7.8  NEUTROABS 7,595  --   HGB 14.9 13.8  HCT 44.7 42.0  MCV 96.8 93.8  PLT 202 263   Lipid Panel: Recent Labs    01/30/23 0804 10/30/23 0734  CHOL 110 195  HDL 50 72  LDLCALC 46 108*  TRIG 63 66  CHOLHDL 2.2 2.7   Lab Results  Component Value Date   HGBA1C 5.9 (H) 01/30/2023    Procedures since last visit: MR CARDIAC MORPHOLOGY W WO CONTRAST Result Date: 11/01/2023 CLINICAL DATA:  Ischemic cardiomyopathy EXAM: CARDIAC MRI TECHNIQUE: The patient was scanned on a 1.5 Tesla Siemens magnet. A dedicated cardiac coil was used. Functional imaging was done using Fiesta sequences. 2,3, and 4 chamber views were done to assess for RWMA's. Modified Simpson's rule using a short axis stack was used to calculate an ejection fraction on a dedicated work [employer]. The patient received 10 cc of Gadavist . After 10 minutes inversion recovery sequences were used to assess for infiltration and scar tissue. Velocity flow mapping performed in the ascending aorta and main pulmonary artery. CONTRAST:  10 cc  of Gadavist  FINDINGS: 1. Normal left ventricular size and thickness. Moderately reduced systolic function (LVEF = 40%). There is global hypokinesis with wall motion abnormalities. There is akinesis of the mid-apical anteroseptal and apical LV walls. There is near transmural late gadolinium enhancement in the LV mid-apical anteroseptal and entire apical LV wall. LVEDV: 228 ml LVESV:  148 ml SV:  80 ml CO: 4.4 L/min Myocardial mass: 133 g 2. Normal right ventricular size, thickness and systolic function (RVEF = 52%). There are no regional wall motion abnormalities. 3.  Normal left and right atrial size. 4. Normal size of the aortic root, ascending aorta and pulmonary artery. 5.  No significant valvular abnormalities. 6.  Normal pericardium.  No pericardial effusion. IMPRESSION: 1. Moderately reduced LV systolic function.  LVEF 40%. 2. There is akinesis of the mid-apical anteroseptal and apical LV walls. 3. There is near transmural LGE in the LV mid-apical anteroseptal and entire apical LV wall. 4. Normal RV size and systolic function. 5. Findings consistent with ischemic cardiomyopathy, prior LAD infarct. Electronically Signed   By: Redell Cave M.D.   On: 11/01/2023 14:15   MR CARDIAC VELOCITY FLOW MAP Result Date: 11/01/2023 CLINICAL DATA:  Ischemic cardiomyopathy EXAM: CARDIAC MRI TECHNIQUE: The patient was scanned on a 1.5 Tesla Siemens magnet. A dedicated cardiac coil was used. Functional imaging was done using Fiesta sequences. 2,3, and 4 chamber views were done to assess for RWMA's. Modified Simpson's rule using a short axis stack was used to calculate an ejection fraction on a dedicated work [employer], [employer]. The patient received 10 cc of Gadavist . After 10 minutes inversion recovery sequences were used to assess for infiltration and scar tissue. Velocity flow mapping performed in the ascending aorta and main pulmonary artery. CONTRAST:  10 cc  of Gadavist  FINDINGS: 1. Normal left ventricular size and thickness. Moderately reduced systolic function (LVEF = 40%). There is global hypokinesis with wall motion abnormalities. There is akinesis of the mid-apical anteroseptal and apical LV walls. There is near transmural late gadolinium enhancement in the LV mid-apical anteroseptal and entire apical LV wall. LVEDV: 228 ml LVESV: 148 ml SV: 80 ml CO: 4.4 L/min Myocardial mass: 133 g 2.  Normal right ventricular size, thickness and systolic function (RVEF = 52%). There are no regional wall motion abnormalities. 3.  Normal left and right atrial size. 4. Normal size of the aortic root, ascending aorta and pulmonary artery. 5.  No significant valvular abnormalities. 6.  Normal pericardium.  No pericardial effusion. IMPRESSION: 1. Moderately reduced LV systolic function.  LVEF 40%. 2. There is akinesis of the mid-apical anteroseptal and apical LV walls. 3. There is near transmural LGE in the LV mid-apical anteroseptal and entire apical LV wall. 4. Normal RV size and systolic function. 5. Findings consistent with ischemic cardiomyopathy, prior LAD infarct. Electronically Signed   By: Redell Cave M.D.   On: 11/01/2023 14:15   MR CARDIAC VELOCITY FLOW MAP Result Date: 11/01/2023 CLINICAL DATA:  Ischemic cardiomyopathy EXAM: CARDIAC MRI TECHNIQUE: The patient was scanned on a 1.5 Tesla Siemens magnet. A dedicated cardiac coil was used. Functional imaging was done using Fiesta sequences. 2,3, and 4 chamber views were done to assess for RWMA's. Modified Simpson's rule using a short axis stack was used to calculate an ejection fraction on a dedicated work [employer] officer, [employer]. The patient received 10 cc of Gadavist . After 10 minutes inversion recovery sequences were used to assess for infiltration and scar tissue. Velocity flow mapping performed in the ascending aorta and main pulmonary artery. CONTRAST:  10 cc  of Gadavist  FINDINGS: 1. Normal left ventricular size and thickness. Moderately reduced systolic function (LVEF = 40%). There is global hypokinesis with wall motion abnormalities. There is akinesis of the mid-apical anteroseptal and apical LV walls. There is near transmural late gadolinium enhancement in the  LV mid-apical anteroseptal and entire apical LV wall. LVEDV: 228 ml LVESV: 148 ml SV: 80 ml CO: 4.4 L/min Myocardial mass: 133 g 2. Normal right ventricular size, thickness and  systolic function (RVEF = 52%). There are no regional wall motion abnormalities. 3.  Normal left and right atrial size. 4. Normal size of the aortic root, ascending aorta and pulmonary artery. 5.  No significant valvular abnormalities. 6.  Normal pericardium.  No pericardial effusion. IMPRESSION: 1. Moderately reduced LV systolic function.  LVEF 40%. 2. There is akinesis of the mid-apical anteroseptal and apical LV walls. 3. There is near transmural LGE in the LV mid-apical anteroseptal and entire apical LV wall. 4. Normal RV size and systolic function. 5. Findings consistent with ischemic cardiomyopathy, prior LAD infarct. Electronically Signed   By: Redell Cave M.D.   On: 11/01/2023 14:15   Results   Assessment/Plan Heart failure with reduced ejection fraction Heart failure with reduced ejection fraction under management. Recent heart MRI performed to investigate ejection fraction concerns. Follow-up with heart specialist scheduled.  Chronic kidney disease Chronic kidney disease under management. Recent medication adjustment (valsartan  reduced from 80 to 40 mg) due to kidney function concerns. Current kidney function appears stable.  Hyperlipidemia Cholesterol levels have returned to typical range. Recent break from exercise may have caused a dip in HDL. No changes in diet reported.  Bladder cancer, under surveillance Bladder cancer under surveillance with annual cystoscopy. Last cystoscopy in May was negative. Two more annual exams required for five-year clearance.  Memory changes, under evaluation Memory changes noted, including occasional forgetfulness and doing tasks in the wrong order. Mood is stable, but concerns about memory persist. - Consider memory exercises at next visit  Night sweats, possible medication side effect Night sweats possibly related to Tylenol  use at night for stiffness. Room temperature maintained at 71-46F, which may be contributing to night sweats. - Try  lowering room temperature at night - Consider taking 1000 mg of Tylenol  at bedtime instead of splitting the dose    Labs/tests ordered:  * No order type specified * Next appt:  02/02/2024

## 2023-11-07 ENCOUNTER — Telehealth: Payer: Self-pay | Admitting: Family

## 2023-11-07 NOTE — Telephone Encounter (Signed)
 Called to confirm/remind patient of their appointment [facility] on [facility]/08[facility].   Appointment:   [] Confirmed  [x] Left mess   [] No answer/No voice mail  [] VM Full/unable to leave message  [] Phone not in service  Patient reminded to bring all medications and/or complete list.  Confirmed patient has transportation. Gave directions, instructed to utilize valet parking.

## 2023-11-07 NOTE — Progress Notes (Unsigned)
 Advanced Heart Failure Clinic Note   Referring Physician: Redell Cave, MD PCP: Abdul Fine, MD (last seen 05/25) Cardiologist: Redell Cave, MD (last seen 03/25)  Chief Complaint: shortness of breath   HPI:  [name] [name] is a 78 y/o male kindly referred by Dr Cave. He has a history of MI w/ stent in 2019, depression, anxiety, bronchiectasis, empyema 2016, HTN, hyperlipidemia, PAF, pulmonary nodule and chronic heart failure. No admissions in the last few years.   TEE 07/17/14: EF 55-60%, mild [name]  LHC 09/27/17: 1. Acute anterior STEMI secondary to occlusion of the mid LAD 2. Successful PTCA/DES x 1 mid LAD 3. Successful PTCA/angioplasty ostium of the Diagonal branch.  4. Mild non-obstructive disease in the RCA, circumflex and mid LAD  Echo 02/22/19: EF 40-45%, moderate LVH, G1DD, normal RV, moderate LAE Echo 11/03/20: EF 40-45%, mild LVH, G1DD, mild LAE, normal RV, mild [name] Echo 07/25/23: EF 30-35%, G1DD, normal RV, mild [name], regional wall motion abnormalities with severe hypokinesis  He presents today, with his wife, for his initial HF visit with a chief complaint of minimal SOB. Sleeping well on 2 pillows. Denies fatigue, chest pain, palpitations, abdominal distention, pedal edema, dizziness or difficulty sleeping. Walks on treadmill 3/ week for 1/2 hour each time along with strength training.   Was on entresto  in the past and it made his bronchiectasis worse.   Review of Systems: [y] = yes, [ ]  = no   General: Weight gain [ ] ; Weight loss [ ] ; Anorexia [ ] ; Fatigue [ ] ; Fever [ ] ; Chills [ ] ; Weakness [ ]   Cardiac: Chest pain/pressure [ ] ; Resting SOB [ ] ; Exertional SOB [ y]; Orthopnea [ ] ; Pedal Edema [ ] ; Palpitations [ ] ; Syncope [ ] ; Presyncope [ ] ; Paroxysmal nocturnal dyspnea[ ]   Pulmonary: Cough [ ] ; Wheezing[ ] ; Hemoptysis[ ] ; Sputum [ ] ; Snoring [ ]   GI: Vomiting[ ] ; Dysphagia[ ] ; Melena[ ] ; Hematochezia [ ] ; Heartburn[ ] ; Abdominal pain [ ] ; Constipation [  ]; Diarrhea [ ] ; BRBPR [ ]   GU: Hematuria[ ] ; Dysuria [ ] ; Nocturia[ ]   Vascular: Pain in legs with walking [ ] ; Pain in feet with lying flat [ ] ; Non-healing sores [ ] ; Stroke [ ] ; TIA [ ] ; Slurred speech [ ] ;  Neuro: Headaches[ ] ; Vertigo[ ] ; Seizures[ ] ; Paresthesias[ ] ;Blurred vision [ ] ; Diplopia [ ] ; Vision changes [ ]   Ortho/Skin: Arthritis [ ] ; Joint pain [ ] ; Muscle pain [ ] ; Joint swelling [ ] ; Back Pain [ ] ; Rash [ ]   Psych: Depression[ ] ; Anxiety[y ]  Heme: Bleeding problems [ ] ; Clotting disorders [ ] ; Anemia [ ]   Endocrine: Diabetes [ ] ; Thyroid  dysfunction[ ]    Past Medical History:  Diagnosis Date   Acute systolic heart failure (HCC)    Anxiety    COPD (chronic obstructive pulmonary disease) (HCC)    Enlarged prostate with lower urinary tract symptoms (LUTS)    GERD (gastroesophageal reflux disease)    Hyperlipidemia    Hypertension    Low serum testosterone  level    PAF (paroxysmal atrial fibrillation) (HCC)    Pneumonia 05/2014   Pyothorax without fistula (HCC)    Sciatica    Shortness of breath dyspnea    Solitary pulmonary nodule    STEMI (ST elevation myocardial infarction) (HCC)    09/27/17 PCI/DES x1 mLAD, PTCA of the diag branch. EF 30% Lifevest at discharge.    Testicular hypofunction    Urinary frequency     Current Outpatient Medications  Medication Sig Dispense Refill   acetaminophen  (TYLENOL ) 500 MG tablet Take 500 mg by mouth every 6 (six) hours as needed for moderate pain.     albuterol  (PROVENTIL ) (2.5 MG/3ML) 0.083% nebulizer solution Take 3 mLs (2.5 mg total) by nebulization in the morning and at bedtime. 250 mL 11   aspirin  EC 81 MG tablet Take 1 tablet (81 mg total) by mouth daily. 90 tablet 3   Calcium  Citrate-Vitamin D  (CALCIUM  CITRATE + D3 PO) Take 1 tablet by mouth daily.     Carboxymethylcellul-Glycerin (LUBRICATING EYE DROPS OP) Place 1 drop into both eyes 2 (two) times daily. As needed     carvedilol  (COREG ) 6.25 MG tablet Take 1 tablet  (6.25 mg total) by mouth 2 (two) times daily with a meal. 180 tablet 3   FARXIGA  10 MG TABS tablet TAKE 1 TABLET(10 MG) BY MOUTH DAILY 90 tablet 3   Menthol , Topical Analgesic, (BIOFREEZE EX) Apply topically.     Multiple Vitamins-Minerals (SENIOR VITES PO) Take 1 tablet by mouth daily.     nitroGLYCERIN  (NITROSTAT ) 0.4 MG SL tablet Place 1 tablet (0.4 mg total) under the tongue every 5 (five) minutes as needed. 25 tablet 3   rosuvastatin  (CRESTOR ) 40 MG tablet TAKE 1 TABLET(40 MG) BY MOUTH DAILY 90 tablet 3   sodium chloride  HYPERTONIC 3 % nebulizer solution Take by nebulization in the morning and at bedtime. 1500 mL 11   tamsulosin  (FLOMAX ) 0.4 MG CAPS capsule TAKE 1 CAPSULE(0.4 MG) BY MOUTH DAILY 90 capsule 1   valsartan  (DIOVAN ) 80 MG tablet Take 1 tablet (80 mg total) by mouth daily. (Patient taking differently: Take 40 mg by mouth daily.) 90 tablet 3   No current facility-administered medications for this visit.    No Known Allergies    Social History   Socioeconomic History   Marital status: Married    Spouse name: Not on file   Number of children: Not on file   Years of education: Not on file   Highest education level: Not on file  Occupational History   Not on file  Tobacco Use   Smoking status: Former    Current packs/day: 0.00    Average packs/day: 1 pack/day for 10.0 years (10.0 ttl pk-yrs)    Types: Pipe, Cigarettes    Start date: 06/06/2004    Quit date: 06/07/2014    Years since quitting: 9.4    Passive exposure: Past   Smokeless tobacco: Never   Tobacco comments:    smoked pipe for 40 years, daily  Vaping Use   Vaping status: Never Used  Substance and Sexual Activity   Alcohol use: Not Currently    Comment: Average 1-2 drinks of beer in the evening; 04/29/20 none since 01/2020   Drug use: No   Sexual activity: Not on file  Other Topics Concern   Not on file  Social History Narrative   Not on file   Social Drivers of Health   Financial Resource Strain:  Not on file  Food Insecurity: No Food Insecurity (06/01/2023)   Hunger Vital Sign    Worried About Running Out of Food in the Last Year: Never true    Ran Out of Food in the Last Year: Never true  Transportation Needs: No Transportation Needs (06/01/2023)   PRAPARE - Administrator, Civil Service (Medical): No    Lack of Transportation (Non-Medical): No  Physical Activity: Not on file  Stress: Not on file  Social Connections: Not on  file  Intimate Partner Violence: Not At Risk (06/01/2023)   Humiliation, Afraid, Rape, and Kick questionnaire    Fear of Current or Ex-Partner: No    Emotionally Abused: No    Physically Abused: No    Sexually Abused: No      Family History  Problem Relation Age of Onset   Tuberculosis Father    Hypertension Father    COPD Mother    Hypertension Mother    Colon cancer Neg Hx    There were no vitals filed for this visit.  Wt Readings from Last 3 Encounters:  11/03/23 197 lb 9.6 oz (89.6 kg)  08/08/23 198 lb 8 oz (90 kg)  08/04/23 197 lb (89.4 kg)   Lab Results  Component Value Date   CREATININE 0.70 10/30/2023   CREATININE 2.08 (H) 08/08/2023   CREATININE 1.39 (H) 05/18/2023    PHYSICAL EXAM:  General: Well appearing. No resp difficulty HEENT: normal Neck: supple, no JVD Cor: Regular rhythm, rate. No rubs, gallops or murmurs Lungs: clear Abdomen: soft, nontender, nondistended. Extremities: no cyanosis, clubbing, rash, edema Neuro: alert & oriented X 3. Moves all 4 extremities w/o difficulty. Affect pleasant   ECG: NSR, HR 61 (personally reviewed)   ASSESSMENT & PLAN:  1: Ischemic heart failure with reduced ejection fraction- - MI 2019 with PTCA/ DES to mid LAD - NYHA class II - euvolemic - weighing daily; parameters to call about discussed - TEE 07/17/14: EF 55-60%, mild [name] - Echo 02/22/19: EF 40-45%, moderate LVH, G1DD, normal RV, moderate LAE - Echo 11/03/20: EF 40-45%, mild LVH, G1DD, mild LAE, normal RV, mild  [name] - Echo 07/25/23: EF 30-35%, G1DD, normal RV, mild [name], regional wall motion abnormalities with severe hypokinesis - continue carvedilol  6.25mg  BID - continue farxiga  10mg  daily - continue valsartan  80mg  daily (bronchiectasis worse w/ entresto ) - discussed MRA but they are leaving tomorrow to go to [facility] and won't be back until August; does not have routine provider there - will consider adding this once he returns locally so that blood work can be closely monitored - will do cMRI to better evaluate heart function/ damage but this will be scheduled for August after he returns - continue exercise - BNP 02/21/22 was 95.9  2: HTN- - BP 133/68 - saw PCP (Beamer) 05/25 - BMET 05/18/23 reviewed: sodium 142, potassium 4.6, creatinine 1.39 and GFR 52 - BMET today  3: CAD- - saw cardiology (Agbor-Etang) 03/25 - continue ASA 81mg  daily - LHC 09/27/17:  1. Acute anterior STEMI secondary to occlusion of the mid LAD  2. Successful PTCA/DES x 1 mid LAD  3. Successful PTCA/angioplasty ostium of the Diagonal branch.   4. Mild non-obstructive disease in the RCA, circumflex and mid LAD  4: Hyperlipidemia- - LDL 01/30/23 was 46 - continue rosuvastatin  40mg  daily  5: PAF- - EKG today is NSR   Return 08/25 when he returns, sooner if needed.   Ellouise DELENA Class, FNP 11/07/23

## 2023-11-08 ENCOUNTER — Encounter: Payer: Self-pay | Admitting: Family

## 2023-11-08 ENCOUNTER — Telehealth: Payer: Self-pay | Admitting: *Deleted

## 2023-11-08 ENCOUNTER — Ambulatory Visit: Attending: Family | Admitting: Family

## 2023-11-08 VITALS — BP 127/65 | HR 60 | Wt 196.0 lb

## 2023-11-08 DIAGNOSIS — J439 Emphysema, unspecified: Secondary | ICD-10-CM | POA: Diagnosis not present

## 2023-11-08 DIAGNOSIS — Z955 Presence of coronary angioplasty implant and graft: Secondary | ICD-10-CM | POA: Diagnosis not present

## 2023-11-08 DIAGNOSIS — Z7984 Long term (current) use of oral hypoglycemic drugs: Secondary | ICD-10-CM | POA: Diagnosis not present

## 2023-11-08 DIAGNOSIS — I255 Ischemic cardiomyopathy: Secondary | ICD-10-CM | POA: Diagnosis not present

## 2023-11-08 DIAGNOSIS — I110 Hypertensive heart disease with heart failure: Secondary | ICD-10-CM | POA: Insufficient documentation

## 2023-11-08 DIAGNOSIS — Z7982 Long term (current) use of aspirin: Secondary | ICD-10-CM | POA: Insufficient documentation

## 2023-11-08 DIAGNOSIS — I10 Essential (primary) hypertension: Secondary | ICD-10-CM

## 2023-11-08 DIAGNOSIS — E785 Hyperlipidemia, unspecified: Secondary | ICD-10-CM | POA: Insufficient documentation

## 2023-11-08 DIAGNOSIS — F329 Major depressive disorder, single episode, unspecified: Secondary | ICD-10-CM | POA: Insufficient documentation

## 2023-11-08 DIAGNOSIS — E782 Mixed hyperlipidemia: Secondary | ICD-10-CM

## 2023-11-08 DIAGNOSIS — I2510 Atherosclerotic heart disease of native coronary artery without angina pectoris: Secondary | ICD-10-CM | POA: Insufficient documentation

## 2023-11-08 DIAGNOSIS — I5022 Chronic systolic (congestive) heart failure: Secondary | ICD-10-CM | POA: Diagnosis not present

## 2023-11-08 DIAGNOSIS — F419 Anxiety disorder, unspecified: Secondary | ICD-10-CM | POA: Diagnosis not present

## 2023-11-08 DIAGNOSIS — Z87891 Personal history of nicotine dependence: Secondary | ICD-10-CM | POA: Diagnosis not present

## 2023-11-08 DIAGNOSIS — Z79899 Other long term (current) drug therapy: Secondary | ICD-10-CM | POA: Diagnosis not present

## 2023-11-08 DIAGNOSIS — N1831 Chronic kidney disease, stage 3a: Secondary | ICD-10-CM

## 2023-11-08 DIAGNOSIS — I480 Paroxysmal atrial fibrillation: Secondary | ICD-10-CM | POA: Diagnosis not present

## 2023-11-08 DIAGNOSIS — I252 Old myocardial infarction: Secondary | ICD-10-CM | POA: Diagnosis not present

## 2023-11-08 MED ORDER — NITROGLYCERIN 0.4 MG SL SUBL
0.4000 mg | SUBLINGUAL_TABLET | SUBLINGUAL | 3 refills | Status: AC | PRN
Start: 2023-11-08 — End: ?

## 2023-11-08 MED ORDER — SPIRONOLACTONE 25 MG PO TABS: 12.5000 mg | ORAL_TABLET | Freq: Every day | ORAL | 3 refills | Status: DC

## 2023-11-08 NOTE — Telephone Encounter (Signed)
 Called and spoke with patient.  1 Week Lab Scheduled 11/16/2023 1 Month Lab Scheduled 12/07/2023  Orders placed and printed and placed in [facility] for Draw.

## 2023-11-08 NOTE — Telephone Encounter (Signed)
 [facility], NP--Good morning. Seeing this mutual patient today and I'm starting 12.5mg  spironolactone  daily. Could you check BMET in 1 week and repeat in ~ 1 month before his next OV with me in 1 month? Thank you  10:37 AM You were added by Richerd Brigham, MD. 10:39 AM Richerd Brigham, MD Good Morning - that should be fine. Donzell can you help with this

## 2023-11-08 NOTE — Patient Instructions (Addendum)
 It was great to see you today!  Medication Changes:  START Spironolactone  12.5mg  (1/2 tab) daily  Lab Work:  Please get blood work at your PCP next week and again in 1 month BEFORE your next appt.     Follow-Up in: Please follow up with the Advanced Heart Failure Clinic in 1 month with Ellouise Class, FNP.   Thank you for choosing [facility] Advanced Heart Failure Clinic.    At the Advanced Heart Failure Clinic, you and your health needs are our priority. We have a designated team specialized in the treatment of Heart Failure. This Care Team includes your primary Heart Failure Specialized Cardiologist (physician), Advanced Practice Providers (APPs- Physician Assistants and Nurse Practitioners), and Pharmacist who all work together to provide you with the care you need, when you need it.   You may see any of the following providers on your designated Care Team at your next follow up:  Dr. Toribio Fuel Dr. Ezra Shuck Dr. Ria Commander Dr. Morene Brownie Ellouise Class, FNP Jaun Bash, RPH-CPP  Please be sure to bring in all your medications bottles to every appointment.   Need to Contact Us :  If you have any questions or concerns before your next appointment please send us  a message through [place] or call our office at [phone].    TO LEAVE A MESSAGE FOR THE NURSE SELECT OPTION 2, PLEASE LEAVE A MESSAGE INCLUDING: YOUR NAME DATE OF BIRTH CALL BACK NUMBER REASON FOR CALL**this is important as we prioritize the call backs  YOU WILL RECEIVE A CALL BACK THE SAME DAY AS LONG AS YOU CALL BEFORE 4:00 PM

## 2023-11-09 ENCOUNTER — Encounter: Payer: Self-pay | Admitting: Family

## 2023-11-10 ENCOUNTER — Encounter: Payer: Self-pay | Admitting: Radiology

## 2023-11-14 DIAGNOSIS — Z23 Encounter for immunization: Secondary | ICD-10-CM | POA: Diagnosis not present

## 2023-11-16 DIAGNOSIS — N1831 Chronic kidney disease, stage 3a: Secondary | ICD-10-CM | POA: Diagnosis not present

## 2023-11-16 DIAGNOSIS — I10 Essential (primary) hypertension: Secondary | ICD-10-CM | POA: Diagnosis not present

## 2023-11-16 LAB — BASIC METABOLIC PANEL WITHOUT GFR
BUN/Creatinine Ratio: 21 (calc) (ref 6–22)
BUN: 31 mg/dL — ABNORMAL HIGH (ref 7–25)
CO2: 32 mmol/L (ref 20–32)
Calcium: 9.2 mg/dL (ref 8.6–10.3)
Chloride: 103 mmol/L (ref 98–110)
Creat: 1.49 mg/dL — ABNORMAL HIGH (ref 0.70–1.28)
Glucose, Bld: 84 mg/dL (ref 65–99)
Potassium: 4.5 mmol/L (ref 3.5–5.3)
Sodium: 140 mmol/L (ref 135–146)

## 2023-11-17 ENCOUNTER — Ambulatory Visit: Payer: Self-pay | Admitting: Student

## 2023-11-27 ENCOUNTER — Other Ambulatory Visit: Payer: Self-pay | Admitting: *Deleted

## 2023-11-27 MED ORDER — COVID-19 MRNA VACCINE (PFIZER) 30 MCG/0.3ML IM SUSP: 0.3000 mL | Freq: Once | INTRAMUSCULAR | 0 refills | Status: AC

## 2023-11-27 NOTE — Telephone Encounter (Signed)
 Patient wife requested Rx through MyChart.

## 2023-12-07 DIAGNOSIS — I10 Essential (primary) hypertension: Secondary | ICD-10-CM | POA: Diagnosis not present

## 2023-12-07 DIAGNOSIS — N1831 Chronic kidney disease, stage 3a: Secondary | ICD-10-CM | POA: Diagnosis not present

## 2023-12-07 LAB — BASIC METABOLIC PANEL WITHOUT GFR
BUN/Creatinine Ratio: 21 (calc) (ref 6–22)
BUN: 31 mg/dL — ABNORMAL HIGH (ref 7–25)
CO2: 32 mmol/L (ref 20–32)
Calcium: 9.6 mg/dL (ref 8.6–10.3)
Chloride: 103 mmol/L (ref 98–110)
Creat: 1.5 mg/dL — ABNORMAL HIGH (ref 0.70–1.28)
Glucose, Bld: 92 mg/dL (ref 65–99)
Potassium: 4.4 mmol/L (ref 3.5–5.3)
Sodium: 139 mmol/L (ref 135–146)

## 2023-12-10 NOTE — Progress Notes (Unsigned)
 Advanced Heart Failure Clinic Note   Referring Physician: Redell Cave, MD PCP: Abdul Fine, MD  Cardiologist: Redell Cave, MD   Chief Complaint: shortness of breath   HPI:  [name] [name] is a 78 y/o male kindly referred by Dr Cave. He has a history of MI w/ stent in 2019, depression, anxiety, bronchiectasis, empyema 2016, HTN, hyperlipidemia, PAF, pulmonary nodule and chronic heart failure. No admissions in the last few years.   TEE 07/17/14: EF 55-60%, mild [name]  LHC 09/27/17: 1. Acute anterior STEMI secondary to occlusion of the mid LAD 2. Successful PTCA/DES x 1 mid LAD 3. Successful PTCA/angioplasty ostium of the Diagonal branch.  4. Mild non-obstructive disease in the RCA, circumflex and mid LAD  Echo 02/22/19: EF 40-45%, moderate LVH, G1DD, normal RV, moderate LAE Echo 11/03/20: EF 40-45%, mild LVH, G1DD, mild LAE, normal RV, mild [name] Echo 07/25/23: EF 30-35%, G1DD, normal RV, mild [name], regional wall motion abnormalities with severe hypokinesis  cMRI 11/01/23: EF 40%, normal RV, no sign of infiltrative disease, findings consistent with ischemic cardiomyopathy, prior LAD infarct  Seen in [facility] 08/25 where spironolactone  12.5mg  daily was started.   He presents today, with his wife, for a HF follow-up visit with a chief complaint of shortness of breath. Sleeping ok. Walking on treadmill at home 30 minutes daily.He is concerned about his renal function worsening since initiation of spironolactone .   Was on entresto  in the past and it made his bronchiectasis worse.   ROS: All systems negative except what is listed in HPI, PMH and Problem List   Past Medical History:  Diagnosis Date   Acute systolic heart failure (HCC)    Anxiety    COPD (chronic obstructive pulmonary disease) (HCC)    Enlarged prostate with lower urinary tract symptoms (LUTS)    GERD (gastroesophageal reflux disease)    Hyperlipidemia    Hypertension    Low serum testosterone  level    PAF  (paroxysmal atrial fibrillation) (HCC)    Pneumonia 05/2014   Pyothorax without fistula (HCC)    Sciatica    Shortness of breath dyspnea    Solitary pulmonary nodule    STEMI (ST elevation myocardial infarction) (HCC)    09/27/17 PCI/DES x1 mLAD, PTCA of the diag branch. EF 30% Lifevest at discharge.    Testicular hypofunction    Urinary frequency     Current Outpatient Medications  Medication Sig Dispense Refill   acetaminophen  (TYLENOL ) 500 MG tablet Take 500 mg by mouth every 6 (six) hours as needed for moderate pain.     albuterol  (PROVENTIL ) (2.5 MG/3ML) 0.083% nebulizer solution Take 3 mLs (2.5 mg total) by nebulization in the morning and at bedtime. 250 mL 11   aspirin  EC 81 MG tablet Take 1 tablet (81 mg total) by mouth daily. 90 tablet 3   Calcium  Citrate-Vitamin D  (CALCIUM  CITRATE + D3 PO) Take 1 tablet by mouth daily.     Carboxymethylcellul-Glycerin (LUBRICATING EYE DROPS OP) Place 1 drop into both eyes 2 (two) times daily. As needed     carvedilol  (COREG ) 6.25 MG tablet Take 1 tablet (6.25 mg total) by mouth 2 (two) times daily with a meal. 180 tablet 3   FARXIGA  10 MG TABS tablet TAKE 1 TABLET(10 MG) BY MOUTH DAILY 90 tablet 3   Menthol , Topical Analgesic, (BIOFREEZE EX) Apply topically.     Multiple Vitamins-Minerals (SENIOR VITES PO) Take 1 tablet by mouth daily.     nitroGLYCERIN  (NITROSTAT ) 0.4 MG SL tablet Place 1 tablet (  0.4 mg total) under the tongue every 5 (five) minutes as needed. 25 tablet 3   rosuvastatin  (CRESTOR ) 40 MG tablet TAKE 1 TABLET(40 MG) BY MOUTH DAILY 90 tablet 3   sodium chloride  HYPERTONIC 3 % nebulizer solution Take by nebulization in the morning and at bedtime. 1500 mL 11   spironolactone  (ALDACTONE ) 25 MG tablet Take 0.5 tablets (12.5 mg total) by mouth daily. 45 tablet 3   tamsulosin  (FLOMAX ) 0.4 MG CAPS capsule TAKE 1 CAPSULE(0.4 MG) BY MOUTH DAILY 90 capsule 1   valsartan  (DIOVAN ) 40 MG tablet Take 40 mg by mouth daily.     No current  facility-administered medications for this visit.    No Known Allergies    Social History   Socioeconomic History   Marital status: Married    Spouse name: Not on file   Number of children: Not on file   Years of education: Not on file   Highest education level: Not on file  Occupational History   Not on file  Tobacco Use   Smoking status: Former    Current packs/day: 0.00    Average packs/day: 1 pack/day for 10.0 years (10.0 ttl pk-yrs)    Types: Pipe, Cigarettes    Start date: 06/06/2004    Quit date: 06/07/2014    Years since quitting: 9.5    Passive exposure: Past   Smokeless tobacco: Never   Tobacco comments:    smoked pipe for 40 years, daily  Vaping Use   Vaping status: Never Used  Substance and Sexual Activity   Alcohol use: Not Currently    Comment: Average 1-2 drinks of beer in the evening; 04/29/20 none since 01/2020   Drug use: No   Sexual activity: Not on file  Other Topics Concern   Not on file  Social History Narrative   Not on file   Social Drivers of Health   Financial Resource Strain: Not on file  Food Insecurity: No Food Insecurity (06/01/2023)   Hunger Vital Sign    Worried About Running Out of Food in the Last Year: Never true    Ran Out of Food in the Last Year: Never true  Transportation Needs: No Transportation Needs (06/01/2023)   PRAPARE - Administrator, Civil Service (Medical): No    Lack of Transportation (Non-Medical): No  Physical Activity: Not on file  Stress: Not on file  Social Connections: Not on file  Intimate Partner Violence: Not At Risk (06/01/2023)   Humiliation, Afraid, Rape, and Kick questionnaire    Fear of Current or Ex-Partner: No    Emotionally Abused: No    Physically Abused: No    Sexually Abused: No      Family History  Problem Relation Age of Onset   Tuberculosis Father    Hypertension Father    COPD Mother    Hypertension Mother    Colon cancer Neg Hx    Vitals:   12/11/23 1050  BP: (!)  146/72  Pulse: (!) 55  SpO2: 99%  Weight: 198 lb 9.6 oz (90.1 kg)   Wt Readings from Last 3 Encounters:  12/11/23 198 lb 9.6 oz (90.1 kg)  11/08/23 196 lb (88.9 kg)  11/03/23 197 lb 9.6 oz (89.6 kg)   Lab Results  Component Value Date   CREATININE 1.50 (H) 12/07/2023   CREATININE 1.49 (H) 11/16/2023   CREATININE 0.70 10/30/2023    PHYSICAL EXAM:  General: Well appearing.  Cor: No JVD. Regular rhythm, rate.  Lungs: clear  Abdomen: soft, nontender, nondistended. Extremities: no edema Neuro:. Affect pleasant   ECG: not done   ASSESSMENT & PLAN:  1: Ischemic heart failure with reduced ejection fraction- - MI 2019 with PTCA/ DES to mid LAD - NYHA class II - euvolemic - weight up 2 pounds from last visit here 1 month ago. Home weight ranges from 190-195 pounds - TEE 07/17/14: EF 55-60%, mild [name] - Echo 02/22/19: EF 40-45%, moderate LVH, G1DD, normal RV, moderate LAE - Echo 11/03/20: EF 40-45%, mild LVH, G1DD, mild LAE, normal RV, mild [name] - Echo 07/25/23: EF 30-35%, G1DD, normal RV, mild [name], regional wall motion abnormalities with severe hypokinesis - cMRI 11/01/23: EF 40%, normal RV, no sign of infiltrative disease, findings consistent with ischemic cardiomyopathy, prior LAD infarct.  - continue carvedilol  6.25mg  BID - continue farxiga  10mg  daily - continue spironolactone  12.5mg  daily. Will repeat BMET in 1 week with PCP and if renal function has not improved, will stop spironolactone . Patient is agreeable to this plan - continue valsartan  40mg  daily (bronchiectasis worse w/ entresto ) - BNP 02/21/22 was 95.9   2: HTN- - BP 146/72 - saw PCP (Beamer) 05/25 - BMET 12/07/23 reviewed: sodium 139, potassium 4.4, creatinine 1.5. Will message PCP and ask her to recheck BMET at [facility] next week  3: CAD- - saw cardiology (Agbor-Etang) 03/25; returns ~ 09/25 - continue ASA 81mg  daily - LHC 09/27/17:  1. Acute anterior STEMI secondary to occlusion of the mid LAD  2. Successful  PTCA/DES x 1 mid LAD  3. Successful PTCA/angioplasty ostium of the Diagonal branch.   4. Mild non-obstructive disease in the RCA, circumflex and mid LAD  4: Hyperlipidemia- - LDL 10/30/23 was 108 - continue rosuvastatin  40mg  daily   Return in 4 months, sooner if needed.   I spent 39 minutes reviewing records, interviewing/ examing patient and managing plan/ orders.    Ellouise DELENA Class, FNP 12/10/23

## 2023-12-11 ENCOUNTER — Encounter: Payer: Self-pay | Admitting: Family

## 2023-12-11 ENCOUNTER — Ambulatory Visit: Attending: Family | Admitting: Family

## 2023-12-11 VITALS — BP 146/72 | HR 55 | Wt 198.6 lb

## 2023-12-11 DIAGNOSIS — I10 Essential (primary) hypertension: Secondary | ICD-10-CM | POA: Diagnosis not present

## 2023-12-11 DIAGNOSIS — Z87891 Personal history of nicotine dependence: Secondary | ICD-10-CM | POA: Insufficient documentation

## 2023-12-11 DIAGNOSIS — Z7982 Long term (current) use of aspirin: Secondary | ICD-10-CM | POA: Insufficient documentation

## 2023-12-11 DIAGNOSIS — E782 Mixed hyperlipidemia: Secondary | ICD-10-CM

## 2023-12-11 DIAGNOSIS — Z7984 Long term (current) use of oral hypoglycemic drugs: Secondary | ICD-10-CM | POA: Insufficient documentation

## 2023-12-11 DIAGNOSIS — I5022 Chronic systolic (congestive) heart failure: Secondary | ICD-10-CM | POA: Insufficient documentation

## 2023-12-11 DIAGNOSIS — I480 Paroxysmal atrial fibrillation: Secondary | ICD-10-CM | POA: Insufficient documentation

## 2023-12-11 DIAGNOSIS — I252 Old myocardial infarction: Secondary | ICD-10-CM | POA: Insufficient documentation

## 2023-12-11 DIAGNOSIS — I110 Hypertensive heart disease with heart failure: Secondary | ICD-10-CM | POA: Insufficient documentation

## 2023-12-11 DIAGNOSIS — I2510 Atherosclerotic heart disease of native coronary artery without angina pectoris: Secondary | ICD-10-CM | POA: Diagnosis not present

## 2023-12-11 DIAGNOSIS — E785 Hyperlipidemia, unspecified: Secondary | ICD-10-CM | POA: Diagnosis not present

## 2023-12-11 DIAGNOSIS — R0602 Shortness of breath: Secondary | ICD-10-CM | POA: Diagnosis present

## 2023-12-11 MED ORDER — ROSUVASTATIN CALCIUM 40 MG PO TABS: 40.0000 mg | ORAL_TABLET | Freq: Every day | ORAL | 3 refills | Status: AC

## 2023-12-11 MED ORDER — VALSARTAN 40 MG PO TABS: 40.0000 mg | ORAL_TABLET | Freq: Every day | ORAL | Status: DC

## 2023-12-11 NOTE — Patient Instructions (Signed)
 It was good to see you today!  If you receive a satisfaction survey regarding the Heart Failure Clinic, please take the time to fill it out. This way we can continue to provide excellent care and make any changes that need to be made.   Medication Changes:  No medication changes today!   Follow-Up in: Please follow up with the Advanced Heart Failure Clinic in 4 months with Ellouise Class, FNP.   Thank you for choosing [facility] Advanced Heart Failure Clinic.    At the Advanced Heart Failure Clinic, you and your health needs are our priority. We have a designated team specialized in the treatment of Heart Failure. This Care Team includes your primary Heart Failure Specialized Cardiologist (physician), Advanced Practice Providers (APPs- Physician Assistants and Nurse Practitioners), and Pharmacist who all work together to provide you with the care you need, when you need it.   You may see any of the following providers on your designated Care Team at your next follow up:  Dr. Toribio Fuel Dr. Ezra Shuck Dr. Ria Commander Dr. Morene Brownie Ellouise Class, FNP Jaun Bash, RPH-CPP  Please be sure to bring in all your medications bottles to every appointment.   Need to Contact Us :  If you have any questions or concerns before your next appointment please send us  a message through [place] or call our office at [phone].    TO LEAVE A MESSAGE FOR THE NURSE SELECT OPTION 2, PLEASE LEAVE A MESSAGE INCLUDING: YOUR NAME DATE OF BIRTH CALL BACK NUMBER REASON FOR CALL**this is important as we prioritize the call backs  YOU WILL RECEIVE A CALL BACK THE SAME DAY AS LONG AS YOU CALL BEFORE 4:00 PM

## 2023-12-12 DIAGNOSIS — Z23 Encounter for immunization: Secondary | ICD-10-CM | POA: Diagnosis not present

## 2023-12-13 ENCOUNTER — Other Ambulatory Visit: Payer: Self-pay | Admitting: Family

## 2023-12-13 ENCOUNTER — Encounter: Payer: Self-pay | Admitting: Family

## 2023-12-13 MED ORDER — VALSARTAN 40 MG PO TABS: 40.0000 mg | ORAL_TABLET | Freq: Every day | ORAL | 3 refills | Status: AC

## 2023-12-14 ENCOUNTER — Encounter: Payer: Self-pay | Admitting: Student

## 2023-12-18 DIAGNOSIS — I5020 Unspecified systolic (congestive) heart failure: Secondary | ICD-10-CM | POA: Diagnosis not present

## 2023-12-18 DIAGNOSIS — I10 Essential (primary) hypertension: Secondary | ICD-10-CM | POA: Diagnosis not present

## 2023-12-18 LAB — BASIC METABOLIC PANEL WITH GFR: EGFR: 48

## 2024-01-03 DIAGNOSIS — D225 Melanocytic nevi of trunk: Secondary | ICD-10-CM | POA: Diagnosis not present

## 2024-01-03 DIAGNOSIS — Z1283 Encounter for screening for malignant neoplasm of skin: Secondary | ICD-10-CM | POA: Diagnosis not present

## 2024-01-03 DIAGNOSIS — L570 Actinic keratosis: Secondary | ICD-10-CM | POA: Diagnosis not present

## 2024-01-03 DIAGNOSIS — L0202 Furuncle of face: Secondary | ICD-10-CM | POA: Diagnosis not present

## 2024-01-03 DIAGNOSIS — B9689 Other specified bacterial agents as the cause of diseases classified elsewhere: Secondary | ICD-10-CM | POA: Diagnosis not present

## 2024-01-03 DIAGNOSIS — X32XXXD Exposure to sunlight, subsequent encounter: Secondary | ICD-10-CM | POA: Diagnosis not present

## 2024-01-12 ENCOUNTER — Other Ambulatory Visit: Payer: Self-pay | Admitting: *Deleted

## 2024-01-12 DIAGNOSIS — N1831 Chronic kidney disease, stage 3a: Secondary | ICD-10-CM

## 2024-01-12 DIAGNOSIS — I10 Essential (primary) hypertension: Secondary | ICD-10-CM

## 2024-01-12 DIAGNOSIS — E785 Hyperlipidemia, unspecified: Secondary | ICD-10-CM

## 2024-01-19 ENCOUNTER — Encounter: Payer: Self-pay | Admitting: Cardiology

## 2024-01-19 ENCOUNTER — Ambulatory Visit: Attending: Cardiology | Admitting: Cardiology

## 2024-01-19 VITALS — BP 120/68 | HR 56 | Ht 72.0 in | Wt 199.0 lb

## 2024-01-19 DIAGNOSIS — I2510 Atherosclerotic heart disease of native coronary artery without angina pectoris: Secondary | ICD-10-CM | POA: Diagnosis not present

## 2024-01-19 DIAGNOSIS — I255 Ischemic cardiomyopathy: Secondary | ICD-10-CM | POA: Diagnosis not present

## 2024-01-19 NOTE — Patient Instructions (Signed)
 Medication Instructions:  Your physician recommends that you continue on your current medications as directed. Please refer to the Current Medication list given to you today.   *If you need a refill on your cardiac medications before your next appointment, please call your pharmacy*  Lab Work: Your provider would like for you to have following labs drawn today BMP.   If you have labs (blood work) drawn today and your tests are completely normal, you will receive your results only by: MyChart Message (if you have MyChart) OR A paper copy in the mail If you have any lab test that is abnormal or we need to change your treatment, we will call you to review the results.  Testing/Procedures: No test ordered today   Follow-Up: At [facility], you and your health needs are our priority.  As part of our continuing mission to provide you with exceptional heart care, our providers are all part of one team.  This team includes your primary Cardiologist (physician) and Advanced Practice Providers or APPs (Physician Assistants and Nurse Practitioners) who all work together to provide you with the care you need, when you need it.  Your next appointment:   1 year(s)  Provider:   You may see Redell Cave, MD or one of the following Advanced Practice Providers on your designated Care Team:   Lonni Meager, NP Lesley Maffucci, PA-C Bernardino Bring, PA-C Cadence Mora, PA-C Tylene Lunch, NP Barnie Hila, NP    We recommend signing up for the patient portal called MyChart.  Sign up information is provided on this After Visit Summary.  MyChart is used to connect with patients for Virtual Visits (Telemedicine).  Patients are able to view lab/test results, encounter notes, upcoming appointments, etc.  Non-urgent messages can be sent to your provider as well.

## 2024-01-19 NOTE — Progress Notes (Signed)
 Cardiology Office Note:    Date:  01/19/2024   ID:  [name] [name], DOB 1946/03/06, MRN [id number]  PCP:  Caro Harlene POUR, NP   [facility] Providers Cardiologist:  Redell Cave, MD     Referring MD: Caro Harlene POUR, NP   Chief Complaint  Patient presents with   Follow-up    6 month follow up visit. The patient denies SOB/ Chest pain or chest pressure. Meds reviewed verbally with patient.     History of Present Illness:    [name] [name] is a 78 y.o. male with a hx of CAD/STEMI s/p DES to LAD, PTCA diagonal on 09/2017, ischemic cardiomyopathy EF 40%, paroxysmal atrial fibrillation in setting of STEMI, right lung empyema 2016 presenting for follow-up.  Last echo showed EF 30 to 35%.  Followed up at heart failure clinic, Aldactone  12.5 mg daily started, renal function noted to be worsening, patient stopped taking Aldactone  4 weeks ago.  He denies chest pain or shortness of breath, denies edema.  Compliant with medications as prescribed.  Prior notes/testing CMR 8/25 EF 40% Echo 5/25 EF 30 to 35% Echo 10/2020 EF 40 to 45%. Not on Aldactone  due to hyperkalemia, off Entresto  due to cough. Atrial fibrillation in the setting of STEMI, follow-up cardiac monitor showed no A-fib. Chronic cough, empyema in right lung diagnosed 2016  Past Medical History:  Diagnosis Date   Acute systolic heart failure (HCC)    Anxiety    COPD (chronic obstructive pulmonary disease) (HCC)    Enlarged prostate with lower urinary tract symptoms (LUTS)    GERD (gastroesophageal reflux disease)    Hyperlipidemia    Hypertension    Low serum testosterone  level    PAF (paroxysmal atrial fibrillation) (HCC)    Pneumonia 05/2014   Pyothorax without fistula (HCC)    Sciatica    Shortness of breath dyspnea    Solitary pulmonary nodule    STEMI (ST elevation myocardial infarction) (HCC)    09/27/17 PCI/DES x1 mLAD, PTCA of the diag branch. EF 30% Lifevest at discharge.    Testicular  hypofunction    Urinary frequency     Past Surgical History:  Procedure Laterality Date   APPENDECTOMY     BACK SURGERY     COLONOSCOPY  2011   Tubular adenoma per patient   COLONOSCOPY N/A 03/03/2015   Dr. Harvey: Left colon is redundant, mild diverticulosis, small internal hemorrhoids, moderate external hemorrhoids.  Next colonoscopy in 5 years with overtube.   COLONOSCOPY N/A 03/25/2019   Procedure: COLONOSCOPY;  Surgeon: Harvey Margo CROME, MD;  Location: [facility];  Service: Endoscopy;  Laterality: N/A;  8:30am - Camille's request   CORONARY ULTRASOUND/IVUS N/A 09/27/2017   Procedure: Intravascular Ultrasound/IVUS;  Surgeon: Verlin Lonni BIRCH, MD;  Location: [facility];  Service: Cardiovascular;  Laterality: N/A;   CORONARY/GRAFT ACUTE MI REVASCULARIZATION N/A 09/27/2017   Procedure: Coronary/Graft Acute MI Revascularization;  Surgeon: Verlin Lonni BIRCH, MD;  Location: [facility];  Service: Cardiovascular;  Laterality: N/A;   CYSTOSCOPY W/ RETROGRADES Bilateral 06/16/2020   Procedure: CYSTOSCOPY WITH RETROGRADE PYELOGRAM;  Surgeon: Twylla Glendia JAYSON, MD;  Location: [facility] OR[facility];  Service: Urology;  Laterality: Bilateral;   EMPYEMA DRAINAGE Right 06/27/2014   Procedure: EMPYEMA DRAINAGE;  Surgeon: Dallas KATHEE Jude, MD;  Location: [facility] OR;  Service: Thoracic;  Laterality: Right;   HEMORRHOID BANDING N/A 03/03/2015   Procedure: GLENICE CAI;  Surgeon: Margo CROME Harvey, MD;  Location: [facility];  Service: Endoscopy;  Laterality: N/A;  Recomended to see a careers adviser.   HERNIA REPAIR     LEFT HEART CATH AND CORONARY ANGIOGRAPHY N/A 09/27/2017   Procedure: LEFT HEART CATH AND CORONARY ANGIOGRAPHY;  Surgeon: Verlin Lonni BIRCH, MD;  Location: [facility];  Service: Cardiovascular;  Laterality: N/A;   LUNG SURGERY  2016   POLYPECTOMY  03/25/2019   Procedure: POLYPECTOMY;  Surgeon: Harvey Margo CROME, MD;  Location: [facility];  Service: Endoscopy;;   TEE  WITHOUT CARDIOVERSION N/A 07/14/2014   Procedure: TRANSESOPHAGEAL ECHOCARDIOGRAM (TEE);  Surgeon: Wilbert JONELLE Bihari, MD;  Location: [facility] ENDOSCOPY;  Service: Cardiovascular;  Laterality: N/A;   TRANSURETHRAL RESECTION OF BLADDER TUMOR N/A 06/16/2020   Procedure: TRANSURETHRAL RESECTION OF BLADDER TUMOR (TURBT) WITH GEMCITABINE ;  Surgeon: Twylla Glendia BROCKS, MD;  Location: [facility] ORS;  Service: Urology;  Laterality: N/A;   VARICOSE VEIN SURGERY     VIDEO ASSISTED THORACOSCOPY Right 06/27/2014   Procedure: VIDEO ASSISTED THORACOSCOPY;  Surgeon: Dallas KATHEE Jude, MD;  Location: [facility] OR;  Service: Thoracic;  Laterality: Right;   VIDEO BRONCHOSCOPY N/A 06/27/2014   Procedure: VIDEO BRONCHOSCOPY;  Surgeon: Dallas KATHEE Jude, MD;  Location: [facility] OR;  Service: Thoracic;  Laterality: N/A;    Current Medications: Current Meds  Medication Sig   acetaminophen  (TYLENOL ) 500 MG tablet Take 500 mg by mouth every 6 (six) hours as needed for moderate pain.   albuterol  (PROVENTIL ) (2.5 MG/3ML) 0.083% nebulizer solution Take 3 mLs (2.5 mg total) by nebulization in the morning and at bedtime.   aspirin  EC 81 MG tablet Take 1 tablet (81 mg total) by mouth daily.   Calcium  Citrate-Vitamin D  (CALCIUM  CITRATE + D3 PO) Take 1 tablet by mouth daily.   Carboxymethylcellul-Glycerin (LUBRICATING EYE DROPS OP) Place 1 drop into both eyes 2 (two) times daily. As needed   carvedilol  (COREG ) 6.25 MG tablet Take 1 tablet (6.25 mg total) by mouth 2 (two) times daily with a meal.   FARXIGA  10 MG TABS tablet TAKE 1 TABLET(10 MG) BY MOUTH DAILY   Menthol , Topical Analgesic, (BIOFREEZE EX) Apply topically.   Multiple Vitamins-Minerals (SENIOR VITES PO) Take 1 tablet by mouth daily.   nitroGLYCERIN  (NITROSTAT ) 0.4 MG SL tablet Place 1 tablet (0.4 mg total) under the tongue every 5 (five) minutes as needed.   rosuvastatin  (CRESTOR ) 40 MG tablet Take 1 tablet (40 mg total) by mouth daily.   sodium chloride  HYPERTONIC 3 % nebulizer solution Take by  nebulization in the morning and at bedtime.   tamsulosin  (FLOMAX ) 0.4 MG CAPS capsule TAKE 1 CAPSULE(0.4 MG) BY MOUTH DAILY   valsartan  (DIOVAN ) 40 MG tablet Take 1 tablet (40 mg total) by mouth daily.   [DISCONTINUED] spironolactone  (ALDACTONE ) 25 MG tablet Take 0.5 tablets (12.5 mg total) by mouth daily.     Allergies:   Patient has no known allergies.   Social History   Socioeconomic History   Marital status: Married    Spouse name: Not on file   Number of children: Not on file   Years of education: Not on file   Highest education level: Not on file  Occupational History   Not on file  Tobacco Use   Smoking status: Former    Current packs/day: 0.00    Average packs/day: 1 pack/day for 10.0 years (10.0 ttl pk-yrs)    Types: Pipe, Cigarettes    Start date: 06/06/2004    Quit date: 06/07/2014    Years since quitting: 9.6  Passive exposure: Past   Smokeless tobacco: Never   Tobacco comments:    smoked pipe for 40 years, daily  Vaping Use   Vaping status: Never Used  Substance and Sexual Activity   Alcohol use: Not Currently    Comment: Average 1-2 drinks of beer in the evening; 04/29/20 none since 01/2020   Drug use: No   Sexual activity: Not on file  Other Topics Concern   Not on file  Social History Narrative   Not on file   Social Drivers of Health   Financial Resource Strain: Not on file  Food Insecurity: No Food Insecurity (06/01/2023)   Hunger Vital Sign    Worried About Running Out of Food in the Last Year: Never true    Ran Out of Food in the Last Year: Never true  Transportation Needs: No Transportation Needs (06/01/2023)   PRAPARE - Administrator, Civil Service (Medical): No    Lack of Transportation (Non-Medical): No  Physical Activity: Not on file  Stress: Not on file  Social Connections: Not on file     Family History: The patient's family history includes COPD in his mother; Hypertension in his father and mother; Tuberculosis in his  father. There is no history of Colon cancer.  ROS:   Please see the history of present illness.     All other systems reviewed and are negative.  EKGs/Labs/Other Studies Reviewed:    The following studies were reviewed today:       Recent Labs: 01/30/2023: Magnesium  2.3 10/24/2023: Hemoglobin 13.8; Platelets 263 10/30/2023: ALT 24 12/07/2023: BUN 31; Creat 1.50; Potassium 4.4; Sodium 139  Recent Lipid Panel    Component Value Date/Time   CHOL 195 10/30/2023 0734   TRIG 66 10/30/2023 0734   HDL 72 10/30/2023 0734   CHOLHDL 2.7 10/30/2023 0734   LDLCALC 108 (H) 10/30/2023 0734     Risk Assessment/Calculations:             Physical Exam:    VS:  BP 120/68 (BP Location: Left Arm, Patient Position: Sitting, Cuff Size: Normal)   Pulse (!) 56   Ht 6' (1.829 m)   Wt 199 lb (90.3 kg)   SpO2 97%   BMI 26.99 kg/m     Wt Readings from Last 3 Encounters:  01/19/24 199 lb (90.3 kg)  12/11/23 198 lb 9.6 oz (90.1 kg)  11/08/23 196 lb (88.9 kg)     GEN:  Well nourished, well developed in no acute distress HEENT: Normal NECK: No JVD; No carotid bruits CARDIAC: RRR, no murmurs, rubs, gallops RESPIRATORY: Diminished breath sounds at right lung base. ABDOMEN: Soft, non-tender, non-distended MUSCULOSKELETAL:  No edema; No deformity  SKIN: Warm and dry NEUROLOGIC:  Alert and oriented x 3 PSYCHIATRIC:  Normal affect   ASSESSMENT:    1. Coronary artery disease involving native coronary artery of native heart without angina pectoris   2. Ischemic cardiomyopathy    PLAN:    In order of problems listed above:  CAD s/p DES to LAD, PTCA diagonal 2019.  Denies chest pain.  Continue aspirin , Crestor  40 mg daily.  Coreg  6.25 mg twice daily.  LDL at goal. Ischemic cardiomyopathy, EF 40%.  Appears euvolemic.  Continue Coreg  6.25 mg twice daily, valsartan  40 mg daily, Farxiga  10 mg daily.  Low normal BP preventing up titration of GDMT.  Being seen at the heart failure clinic.   Previously did not tolerate Entresto , higher dose of valsartan  because worsening renal  function, Aldactone  caused hyperkalemia.   Follow-up in 10-12 months     Medication Adjustments/Labs and Tests Ordered: Current medicines are reviewed at length with the patient today.  Concerns regarding medicines are outlined above.  Orders Placed This Encounter  Procedures   Basic metabolic panel with GFR   No orders of the defined types were placed in this encounter.   Patient Instructions  Medication Instructions:  Your physician recommends that you continue on your current medications as directed. Please refer to the Current Medication list given to you today.   *If you need a refill on your cardiac medications before your next appointment, please call your pharmacy*  Lab Work: Your provider would like for you to have following labs drawn today BMP.   If you have labs (blood work) drawn today and your tests are completely normal, you will receive your results only by: MyChart Message (if you have MyChart) OR A paper copy in the mail If you have any lab test that is abnormal or we need to change your treatment, we will call you to review the results.  Testing/Procedures: No test ordered today   Follow-Up: At [facility], you and your health needs are our priority.  As part of our continuing mission to provide you with exceptional heart care, our providers are all part of one team.  This team includes your primary Cardiologist (physician) and Advanced Practice Providers or APPs (Physician Assistants and Nurse Practitioners) who all work together to provide you with the care you need, when you need it.  Your next appointment:   1 year(s)  Provider:   You may see Redell Cave, MD or one of the following Advanced Practice Providers on your designated Care Team:   Lonni Meager, NP Lesley Maffucci, PA-C Bernardino Bring, PA-C Cadence [place], PA-C Tylene Lunch, NP Barnie Hila, NP    We recommend signing up for the patient portal called MyChart.  Sign up information is provided on this After Visit Summary.  MyChart is used to connect with patients for Virtual Visits (Telemedicine).  Patients are able to view lab/test results, encounter notes, upcoming appointments, etc.  Non-urgent messages can be sent to your provider as well.             Signed, Redell Cave, MD  01/19/2024 10:55 AM    [facility]

## 2024-01-20 ENCOUNTER — Encounter: Payer: Self-pay | Admitting: Family

## 2024-01-20 LAB — BASIC METABOLIC PANEL WITH GFR
BUN/Creatinine Ratio: 18 (ref 10–24)
BUN: 29 mg/dL — ABNORMAL HIGH (ref 8–27)
CO2: 24 mmol/L (ref 20–29)
Calcium: 9.1 mg/dL (ref 8.6–10.2)
Chloride: 104 mmol/L (ref 96–106)
Creatinine, Ser: 1.64 mg/dL — ABNORMAL HIGH (ref 0.76–1.27)
Glucose: 81 mg/dL (ref 70–99)
Potassium: 4.6 mmol/L (ref 3.5–5.2)
Sodium: 145 mmol/L — ABNORMAL HIGH (ref 134–144)
eGFR: 43 mL/min/1.73 — ABNORMAL LOW (ref >59)

## 2024-01-22 ENCOUNTER — Encounter: Payer: Self-pay | Admitting: Nurse Practitioner

## 2024-01-22 ENCOUNTER — Encounter: Payer: Self-pay | Admitting: Radiology

## 2024-01-22 ENCOUNTER — Ambulatory Visit: Admitting: Student in an Organized Health Care Education/Training Program

## 2024-01-22 ENCOUNTER — Encounter: Payer: Self-pay | Admitting: Student in an Organized Health Care Education/Training Program

## 2024-01-22 VITALS — BP 110/62 | HR 59 | Ht 72.0 in | Wt 199.4 lb

## 2024-01-22 DIAGNOSIS — J479 Bronchiectasis, uncomplicated: Secondary | ICD-10-CM | POA: Diagnosis not present

## 2024-01-22 DIAGNOSIS — Z87891 Personal history of nicotine dependence: Secondary | ICD-10-CM

## 2024-01-22 NOTE — Telephone Encounter (Signed)
 [name] [name] I am not sure why we are unable to see results.

## 2024-01-22 NOTE — Progress Notes (Signed)
 Assessment & Plan:   #Bronchiectasis without complication (HCC) (Primary)  Bronchiectasis is secondary to prior severe right lower lobe pneumonia and empyema, with a history of VATS decortication in 2016.   His previous cultures from July of 2024 had grown candida, and repeat ordered in November of 2024 also only growing candida. He did have worsening symptoms during a prior visit and was treated with a course of levofloxacin  with significant improvement. He now has a minimal cough. He continues on his pulmonary clearance regimen.  Symptoms have very slightly worsened since May 2025 but are significantly improved compared to a year ago. The current regimen of hypertonic saline, albuterol , a Flutter device, and incentive spirometry effectively mobilizes phlegm. He has experimented with reducing albuterol  use, noting minimal effects from missed doses, and is interested in trialing a reduced frequency to once daily, with plans to revert if symptoms worsen. No significant nasal congestion, allergies, fevers, chest pain, or changes in phlegm production are noted.   Continue with current regimen, and trial reducing regimen use to once daily, starting with every other day reduction, and monitor symptoms.   Return in about 6 months (around 07/21/2024).  Belva November, MD [facility]  I spent 30 minutes caring for this patient today, including preparing to see the patient, obtaining a medical history , reviewing a separately obtained history, performing a medically appropriate examination and/or evaluation, counseling and educating the patient/family/caregiver, documenting clinical information in the electronic health record, and independently interpreting results (not separately reported/billed) and communicating results to the patient/family/caregiver  End of visit medications:  No orders of the defined types were placed in this encounter.    Current Outpatient Medications:     acetaminophen  (TYLENOL ) 500 MG tablet, Take 500 mg by mouth every 6 (six) hours as needed for moderate pain., Disp: , Rfl:    albuterol  (PROVENTIL ) (2.5 MG/3ML) 0.083% nebulizer solution, Take 3 mLs (2.5 mg total) by nebulization in the morning and at bedtime., Disp: 250 mL, Rfl: 11   aspirin  EC 81 MG tablet, Take 1 tablet (81 mg total) by mouth daily., Disp: 90 tablet, Rfl: 3   Calcium  Citrate-Vitamin D  (CALCIUM  CITRATE + D3 PO), Take 1 tablet by mouth daily., Disp: , Rfl:    Carboxymethylcellul-Glycerin (LUBRICATING EYE DROPS OP), Place 1 drop into both eyes 2 (two) times daily. As needed, Disp: , Rfl:    carvedilol  (COREG ) 6.25 MG tablet, Take 1 tablet (6.25 mg total) by mouth 2 (two) times daily with a meal., Disp: 180 tablet, Rfl: 3   FARXIGA  10 MG TABS tablet, TAKE 1 TABLET(10 MG) BY MOUTH DAILY, Disp: 90 tablet, Rfl: 3   Menthol , Topical Analgesic, (BIOFREEZE EX), Apply topically., Disp: , Rfl:    Multiple Vitamins-Minerals (SENIOR VITES PO), Take 1 tablet by mouth daily., Disp: , Rfl:    nitroGLYCERIN  (NITROSTAT ) 0.4 MG SL tablet, Place 1 tablet (0.4 mg total) under the tongue every 5 (five) minutes as needed., Disp: 25 tablet, Rfl: 3   rosuvastatin  (CRESTOR ) 40 MG tablet, Take 1 tablet (40 mg total) by mouth daily., Disp: 90 tablet, Rfl: 3   sodium chloride  HYPERTONIC 3 % nebulizer solution, Take by nebulization in the morning and at bedtime., Disp: 1500 mL, Rfl: 11   tamsulosin  (FLOMAX ) 0.4 MG CAPS capsule, TAKE 1 CAPSULE(0.4 MG) BY MOUTH DAILY, Disp: 90 capsule, Rfl: 1   traZODone  (DESYREL ) 50 MG tablet, Take 50 mg by mouth at bedtime as needed for sleep., Disp: , Rfl:  valsartan  (DIOVAN ) 40 MG tablet, Take 1 tablet (40 mg total) by mouth daily., Disp: 90 tablet, Rfl: 3   Subjective:   PATIENT ID: [name] [name] GENDER: male DOB: 03-23-1945, MRN: [id number]  Chief Complaint  Patient presents with   Follow-up    Cough with occasional yellow sputum. No SOB or  wheezing. Albuterol  Neb- BID   Hypertonic Saline- BID    HPI  Discussed the use of AI scribe software for clinical note transcription with the patient, who gave verbal consent to proceed.  [name] [name] is a 78 year old male with bronchiectasis who presents for follow-up.  Patient reports that his respiratory symptoms all started in 2016 following a pulmonary infection that was complicated by empyema. Review of the medical record is notable for an admission in April of 2016 where he was seen for empyema and underwent flexible bronchoscopy in addition to VATS decortication of the visceral pleura. CT abdomen/pelvis March 2016 showed a RLL pneuomonia, while CT chest from April 7 of 2016 showed a loculated pleural effusion consistent with empyema. The following CT from April 20 of 2016 showed persistent collapse/atelectasis in the RLL. June 2016 imaging showed re-expansion of the RLL with post infectious fibrosis and scarring. A CT chest from August of 2017 showed very mild bronchiolectasis with post surgical changes. Bronchiectasis begins to be evident in imaging from march of 2019, with associated tree-in-bud opacities in addition to the ectatic and dilated bronchi.  Return Visit 07/27/2023:  He had reported significant improvement in his symptoms during our last visit in December 2024 with resolution of the cough and improvement in his shortness of breath.  He was doing quite well and continues to do so.  He is reporting a very sporadic cough that is dry and without sputum production.  This is minimally increased compared to our last visit.  He continues to be compliant with his pulmonary clearance regimen.  He has not used any inhalers, and reports not feeling any improvement or any change in symptoms with the different inhalers previously prescribed, including Trelegy.  He has not used any inhalers for quite a long time.   I prescribed a course of levofloxacin  following our visit in November  of 2024 where he reported cough and increased shortness of breath consistent with an exacerbation of bronchiectasis.  The combination of antibiotics and the pulmonary clearance regimen has resulted in near resolution of all symptoms.   During our previous visits, he'd reported a cough that was persistent and productive. Imaging studies, including multiple chest CT, had shown bronchiectasis in the RLL. He was not on inhalers at the time of our visit, reporting minimal improvement after trying them previously. He reported exacerbations in his breathing a few times a year with fever and increase in sputum production.   Return Visit 01/22/2024:  He has a history of bronchiectasis secondary to a severe right lower lobe pneumonia and empyema, status post VATS decortication in 2016. His pulmonary clearance regimen includes hypertonic saline, albuterol , a Flutter device, and incentive spirometry. Previous cultures have only grown Candida albicans.  Since the last visit, he feels 'only slightly worse' than before, but 'nothing like what I was a year ago.' He attempted to reduce the use of albuterol  but noticed worsening symptoms, so he reverted to the original regimen. He experiences variability in symptoms, with some days and weeks being worse than others. He is considering trying a reduced frequency of albuterol  again, as missing doses occasionally has not led  to significant effects.  He reports minimal changes in the amount of phlegm and cough, with symptoms being 'slightly more' than before. No significant nasal congestion or allergies, and his chest sounds 'slightly' more rattly. He is able to cough up phlegm after completing his regimen. No fevers, chest pains, or chest discomfort.  He inquires about the cost of hypertonic saline, which is not covered by insurance, costing over forty dollars a month.     Patient previously worked as an programmer, systems, holiday representative in college. He is a former  smoker, with around 10 pack years of smoking history, in addition to smoking pipe for over 40 years. He is currently retired.  Ancillary information including prior medications, full medical/surgical/family/social histories, and PFTs (when available) are listed below and have been reviewed.    Review of Systems  Constitutional:  Negative for chills, fever, malaise/fatigue and weight loss.  Respiratory:  Positive for cough and sputum production. Negative for hemoptysis, shortness of breath and wheezing.   Cardiovascular:  Negative for chest pain.     Objective:   Vitals:   01/22/24 0943  BP: 110/62  Pulse: (!) 59  SpO2: 97%  Weight: 199 lb 6.4 oz (90.4 kg)  Height: 6' (1.829 m)   97% on RA  BMI Readings from Last 3 Encounters:  01/22/24 27.04 kg/m  01/19/24 26.99 kg/m  12/11/23 26.94 kg/m   Wt Readings from Last 3 Encounters:  01/22/24 199 lb 6.4 oz (90.4 kg)  01/19/24 199 lb (90.3 kg)  12/11/23 198 lb 9.6 oz (90.1 kg)    Physical Exam Constitutional:      Appearance: Normal appearance.  Cardiovascular:     Rate and Rhythm: Normal rate and regular rhythm.     Pulses: Normal pulses.     Heart sounds: Normal heart sounds.  Pulmonary:     Effort: Pulmonary effort is normal.     Breath sounds: Rales (right lower lung field) present. No wheezing.  Musculoskeletal:     Right lower leg: No edema.     Left lower leg: No edema.  Neurological:     General: No focal deficit present.     Mental Status: He is alert and oriented to person, place, and time. Mental status is at baseline.     Ancillary Information    Past Medical History:  Diagnosis Date   Acute systolic heart failure (HCC)    Anxiety    COPD (chronic obstructive pulmonary disease) (HCC)    Enlarged prostate with lower urinary tract symptoms (LUTS)    GERD (gastroesophageal reflux disease)    Hyperlipidemia    Hypertension    Low serum testosterone  level    PAF (paroxysmal atrial fibrillation)  (HCC)    Pneumonia 05/2014   Pyothorax without fistula (HCC)    Sciatica    Shortness of breath dyspnea    Solitary pulmonary nodule    STEMI (ST elevation myocardial infarction) (HCC)    09/27/17 PCI/DES x1 mLAD, PTCA of the diag branch. EF 30% Lifevest at discharge.    Testicular hypofunction    Urinary frequency      Family History  Problem Relation Age of Onset   Tuberculosis Father    Hypertension Father    COPD Mother    Hypertension Mother    Colon cancer Neg Hx      Past Surgical History:  Procedure Laterality Date   APPENDECTOMY     BACK SURGERY     COLONOSCOPY  2011   Tubular adenoma  per patient   COLONOSCOPY N/A 03/03/2015   Dr. Harvey: Left colon is redundant, mild diverticulosis, small internal hemorrhoids, moderate external hemorrhoids.  Next colonoscopy in 5 years with overtube.   COLONOSCOPY N/A 03/25/2019   Procedure: COLONOSCOPY;  Surgeon: Harvey Margo CROME, MD;  Location: [facility];  Service: Endoscopy;  Laterality: N/A;  8:30am - Camille's request   CORONARY ULTRASOUND/IVUS N/A 09/27/2017   Procedure: Intravascular Ultrasound/IVUS;  Surgeon: Verlin Lonni BIRCH, MD;  Location: [facility];  Service: Cardiovascular;  Laterality: N/A;   CORONARY/GRAFT ACUTE MI REVASCULARIZATION N/A 09/27/2017   Procedure: Coronary/Graft Acute MI Revascularization;  Surgeon: Verlin Lonni BIRCH, MD;  Location: [facility];  Service: Cardiovascular;  Laterality: N/A;   CYSTOSCOPY W/ RETROGRADES Bilateral 06/16/2020   Procedure: CYSTOSCOPY WITH RETROGRADE PYELOGRAM;  Surgeon: Twylla Glendia BROCKS, MD;  Location: [facility];  Service: Urology;  Laterality: Bilateral;   EMPYEMA DRAINAGE Right 06/27/2014   Procedure: EMPYEMA DRAINAGE;  Surgeon: Dallas KATHEE Jude, MD;  Location: [facility] OR;  Service: Thoracic;  Laterality: Right;   HEMORRHOID BANDING N/A 03/03/2015   Procedure: GLENICE CAI;  Surgeon: Margo CROME Harvey, MD;  Location: [facility];  Service: Endoscopy;   Laterality: N/A;  Recomended to see a careers adviser.   HERNIA REPAIR     LEFT HEART CATH AND CORONARY ANGIOGRAPHY N/A 09/27/2017   Procedure: LEFT HEART CATH AND CORONARY ANGIOGRAPHY;  Surgeon: Verlin Lonni BIRCH, MD;  Location: [facility];  Service: Cardiovascular;  Laterality: N/A;   LUNG SURGERY  2016   POLYPECTOMY  03/25/2019   Procedure: POLYPECTOMY;  Surgeon: Harvey Margo CROME, MD;  Location: [facility];  Service: Endoscopy;;   TEE WITHOUT CARDIOVERSION N/A 07/14/2014   Procedure: TRANSESOPHAGEAL ECHOCARDIOGRAM (TEE);  Surgeon: Wilbert JONELLE Bihari, MD;  Location: [facility] ENDOSCOPY;  Service: Cardiovascular;  Laterality: N/A;   TRANSURETHRAL RESECTION OF BLADDER TUMOR N/A 06/16/2020   Procedure: TRANSURETHRAL RESECTION OF BLADDER TUMOR (TURBT) WITH GEMCITABINE ;  Surgeon: Twylla Glendia BROCKS, MD;  Location: [facility];  Service: Urology;  Laterality: N/A;   VARICOSE VEIN SURGERY     VIDEO ASSISTED THORACOSCOPY Right 06/27/2014   Procedure: VIDEO ASSISTED THORACOSCOPY;  Surgeon: Dallas KATHEE Jude, MD;  Location: [facility] OR;  Service: Thoracic;  Laterality: Right;   VIDEO BRONCHOSCOPY N/A 06/27/2014   Procedure: VIDEO BRONCHOSCOPY;  Surgeon: Dallas KATHEE Jude, MD;  Location[facility] OR;  Service: Thoracic;  Laterality: N/A;    Social History   Socioeconomic History   Marital status: Married    Spouse name: Not on file   Number of children: Not on file   Years of education: Not on file   Highest education level: Not on file  Occupational History   Not on file  Tobacco Use   Smoking status: Former    Current packs/day: 0.00    Average packs/day: 1 pack/day for 10.0 years (10.0 ttl pk-yrs)    Types: Pipe, Cigarettes    Start date: 06/06/2004    Quit date: 06/07/2014    Years since quitting: 9.6    Passive exposure: Past   Smokeless tobacco: Never   Tobacco comments:    smoked pipe for 40 years, daily  Vaping Use   Vaping status: Never Used  Substance and Sexual Activity   Alcohol use: Not Currently     Comment: Average 1-2 drinks of beer in the evening; 04/29/20 none since 01/2020   Drug use: No   Sexual activity: Not on file  Other Topics Concern  Not on file  Social History Narrative   Not on file   Social Drivers of Health   Financial Resource Strain: Not on file  Food Insecurity: No Food Insecurity (06/01/2023)   Hunger Vital Sign    Worried About Running Out of Food in the Last Year: Never true    Ran Out of Food in the Last Year: Never true  Transportation Needs: No Transportation Needs (06/01/2023)   PRAPARE - Administrator, Civil Service (Medical): No    Lack of Transportation (Non-Medical): No  Physical Activity: Not on file  Stress: Not on file  Social Connections: Not on file  Intimate Partner Violence: Not At Risk (06/01/2023)   Humiliation, Afraid, Rape, and Kick questionnaire    Fear of Current or Ex-Partner: No    Emotionally Abused: No    Physically Abused: No    Sexually Abused: No     No Known Allergies   CBC    Component Value Date/Time   WBC 7.8 10/24/2023 1118   RBC 4.48 10/24/2023 1118   HGB 13.8 10/24/2023 1118   HGB 15.1 01/10/2022 1049   HCT 42.0 10/24/2023 1118   HCT 45.9 01/10/2022 1049   PLT 263 10/24/2023 1118   PLT 239 01/10/2022 1049   MCV 93.8 10/24/2023 1118   MCV 96 01/10/2022 1049   MCH 30.8 10/24/2023 1118   MCHC 32.9 10/24/2023 1118   RDW 12.7 10/24/2023 1118   RDW 12.2 01/10/2022 1049   LYMPHSABS 1.3 01/10/2022 1049   MONOABS 1.2 (H) 12/13/2019 1006   EOSABS 131 01/30/2023 0804   EOSABS 0.2 01/10/2022 1049   BASOSABS 61 01/30/2023 0804   BASOSABS 0.1 01/10/2022 1049    Pulmonary Functions Testing Results:    Latest Ref Rng & Units 08/06/2019    1:47 PM  PFT Results  FVC-Pre L 3.80   FVC-Predicted Pre % 80   FVC-Post L 4.08   FVC-Predicted Post % 86   Pre FEV1/FVC % % 60   Post FEV1/FCV % % 64   FEV1-Pre L 2.26   FEV1-Predicted Pre % 66   FEV1-Post L 2.60   DLCO uncorrected ml/min/mmHg 18.09   DLCO  [facility]% % 66   DLCO corrected ml/min/mmHg 18.09   DLCO COR %Predicted % 66   DLVA Predicted % 79     Outpatient Medications Prior to Visit  Medication Sig Dispense Refill   acetaminophen  (TYLENOL ) 500 MG tablet Take 500 mg by mouth every 6 (six) hours as needed for moderate pain.     albuterol  (PROVENTIL ) (2.5 MG/3ML) 0.083% nebulizer solution Take 3 mLs (2.5 mg total) by nebulization in the morning and at bedtime. 250 mL 11   aspirin  EC 81 MG tablet Take 1 tablet (81 mg total) by mouth daily. 90 tablet 3   Calcium  Citrate-Vitamin D  (CALCIUM  CITRATE + D3 PO) Take 1 tablet by mouth daily.     Carboxymethylcellul-Glycerin (LUBRICATING EYE DROPS OP) Place 1 drop into both eyes 2 (two) times daily. As needed     carvedilol  (COREG ) 6.25 MG tablet Take 1 tablet (6.25 mg total) by mouth 2 (two) times daily with a meal. 180 tablet 3   FARXIGA  10 MG TABS tablet TAKE 1 TABLET(10 MG) BY MOUTH DAILY 90 tablet 3   Menthol , Topical Analgesic, (BIOFREEZE EX) Apply topically.     Multiple Vitamins-Minerals (SENIOR VITES PO) Take 1 tablet by mouth daily.     nitroGLYCERIN  (NITROSTAT ) 0.4 MG SL tablet Place 1 tablet (0.4 mg  total) under the tongue every 5 (five) minutes as needed. 25 tablet 3   rosuvastatin  (CRESTOR ) 40 MG tablet Take 1 tablet (40 mg total) by mouth daily. 90 tablet 3   sodium chloride  HYPERTONIC 3 % nebulizer solution Take by nebulization in the morning and at bedtime. 1500 mL 11   tamsulosin  (FLOMAX ) 0.4 MG CAPS capsule TAKE 1 CAPSULE(0.4 MG) BY MOUTH DAILY 90 capsule 1   traZODone  (DESYREL ) 50 MG tablet Take 50 mg by mouth at bedtime as needed for sleep.     valsartan  (DIOVAN ) 40 MG tablet Take 1 tablet (40 mg total) by mouth daily. 90 tablet 3   No facility-administered medications prior to visit.

## 2024-01-22 NOTE — Patient Instructions (Addendum)
  VISIT SUMMARY: You came in today for a follow-up on your bronchiectasis. You have been managing your condition with hypertonic saline, albuterol , a Flutter device, and incentive spirometry. Since your last visit, your symptoms have slightly worsened but are still much better than they were a year ago. You have been experimenting with reducing your albuterol  use and are considering trying a reduced frequency again. You also inquired about the cost of hypertonic saline.  YOUR PLAN:  -BRONCHIECTASIS OF RIGHT LOWER LOBE POST-PNEUMONIA AND EMPYEMA: Bronchiectasis is a condition where the airways in your lungs become damaged and widened, often due to infections like pneumonia. Your symptoms have slightly worsened since May 2025 but are much better than a year ago. You should continue your current regimen of hypertonic saline, albuterol , a Flutter device, and incentive spirometry to help clear your lungs. You can try reducing your albuterol  use to once daily, starting with every other day, and monitor your symptoms. If your symptoms worsen, revert to your original regimen. It's important to use sterile hypertonic saline to avoid contamination risks.  INSTRUCTIONS: Please monitor your symptoms as you try reducing your albuterol  use to once daily, starting with every other day. If your symptoms worsen, revert to your original regimen. Continue using sterile hypertonic saline and follow your current pulmonary clearance regimen. If you have any concerns or notice significant changes in your symptoms, please schedule a follow-up appointment.

## 2024-01-23 ENCOUNTER — Ambulatory Visit: Payer: Self-pay | Admitting: Cardiology

## 2024-01-23 ENCOUNTER — Other Ambulatory Visit: Payer: Self-pay

## 2024-01-23 MED ORDER — TAMSULOSIN HCL 0.4 MG PO CAPS: 0.4000 mg | ORAL_CAPSULE | Freq: Every day | ORAL | 1 refills | Status: AC

## 2024-01-23 NOTE — Telephone Encounter (Signed)
 I think these results were process through [facility], and that is why they are not in epic. We did not place the orders.

## 2024-01-29 ENCOUNTER — Ambulatory Visit: Payer: Self-pay | Admitting: Nurse Practitioner

## 2024-01-29 DIAGNOSIS — I10 Essential (primary) hypertension: Secondary | ICD-10-CM | POA: Diagnosis not present

## 2024-01-29 DIAGNOSIS — E785 Hyperlipidemia, unspecified: Secondary | ICD-10-CM | POA: Diagnosis not present

## 2024-01-29 DIAGNOSIS — N1831 Chronic kidney disease, stage 3a: Secondary | ICD-10-CM | POA: Diagnosis not present

## 2024-01-29 LAB — CBC WITH DIFFERENTIAL/PLATELET
Absolute Lymphocytes: 1876 {cells}/uL (ref 850–3900)
Absolute Monocytes: 770 {cells}/uL (ref 200–950)
Basophils Absolute: 70 {cells}/uL (ref 0–200)
Basophils Relative: 1 %
Eosinophils Absolute: 280 {cells}/uL (ref 15–500)
Eosinophils Relative: 4 %
HCT: 46.9 % (ref 38.5–50.0)
Hemoglobin: 15.6 g/dL (ref 13.2–17.1)
MCH: 31.1 pg (ref 27.0–33.0)
MCHC: 33.3 g/dL (ref 32.0–36.0)
MCV: 93.6 fL (ref 80.0–100.0)
MPV: 10.1 fL (ref 7.5–12.5)
Monocytes Relative: 11 %
Neutro Abs: 4004 {cells}/uL (ref 1500–7800)
Neutrophils Relative %: 57.2 %
Platelets: 215 Thousand/uL (ref 140–400)
RBC: 5.01 Million/uL (ref 4.20–5.80)
RDW: 12.7 % (ref 11.0–15.0)
Total Lymphocyte: 26.8 %
WBC: 7 Thousand/uL (ref 3.8–10.8)

## 2024-01-29 LAB — BASIC METABOLIC PANEL WITH GFR
BUN/Creatinine Ratio: 22 (calc) (ref 6–22)
BUN: 36 mg/dL — ABNORMAL HIGH (ref 7–25)
CO2: 33 mmol/L — ABNORMAL HIGH (ref 20–32)
Calcium: 9.5 mg/dL (ref 8.6–10.3)
Chloride: 103 mmol/L (ref 98–110)
Creat: 1.63 mg/dL — ABNORMAL HIGH (ref 0.70–1.28)
Glucose, Bld: 91 mg/dL (ref 65–99)
Potassium: 4.8 mmol/L (ref 3.5–5.3)
Sodium: 141 mmol/L (ref 135–146)
eGFR: 43 mL/min/1.73m2 — ABNORMAL LOW (ref >=60)

## 2024-01-29 LAB — LIPID PANEL
Cholesterol: 101 mg/dL (ref <200)
HDL: 40 mg/dL (ref >=40)
LDL Cholesterol (Calc): 47 mg/dL
Non-HDL Cholesterol (Calc): 61 mg/dL (ref <130)
Total CHOL/HDL Ratio: 2.5 (calc) (ref <5.0)
Triglycerides: 65 mg/dL (ref <150)

## 2024-02-02 ENCOUNTER — Encounter: Admitting: Student

## 2024-02-06 ENCOUNTER — Non-Acute Institutional Stay: Payer: Self-pay | Admitting: Nurse Practitioner

## 2024-02-06 ENCOUNTER — Encounter: Payer: Self-pay | Admitting: Nurse Practitioner

## 2024-02-06 VITALS — BP 114/70 | HR 58 | Temp 98.6°F | Ht 72.0 in | Wt 197.8 lb

## 2024-02-06 DIAGNOSIS — E785 Hyperlipidemia, unspecified: Secondary | ICD-10-CM | POA: Diagnosis not present

## 2024-02-06 DIAGNOSIS — I5020 Unspecified systolic (congestive) heart failure: Secondary | ICD-10-CM

## 2024-02-06 DIAGNOSIS — J479 Bronchiectasis, uncomplicated: Secondary | ICD-10-CM | POA: Diagnosis not present

## 2024-02-06 DIAGNOSIS — I10 Essential (primary) hypertension: Secondary | ICD-10-CM | POA: Diagnosis not present

## 2024-02-06 DIAGNOSIS — N1832 Chronic kidney disease, stage 3b: Secondary | ICD-10-CM

## 2024-02-06 DIAGNOSIS — Z8551 Personal history of malignant neoplasm of bladder: Secondary | ICD-10-CM

## 2024-02-06 DIAGNOSIS — I2510 Atherosclerotic heart disease of native coronary artery without angina pectoris: Secondary | ICD-10-CM | POA: Diagnosis not present

## 2024-02-06 DIAGNOSIS — N400 Enlarged prostate without lower urinary tract symptoms: Secondary | ICD-10-CM

## 2024-02-06 NOTE — Progress Notes (Unsigned)
 Careteam: Patient Care Team: [name] Harlene POUR, NP as PCP - General (Geriatric Medicine) Darliss Rogue, MD as PCP - Cardiology (Cardiology) Comer, Lamar ORN, MD (Inactive) as Consulting Physician (Infectious Diseases) Cindie Carlin POUR, DO as Consulting Physician (Internal Medicine) Isadora Hose, MD as Consulting Physician (Pulmonary Disease) PLACE OF SERVICE:  [facility]   Advanced Directive information    No Known Allergies  Chief Complaint  Patient presents with   Medical Management of Chronic Issues    3 month follow-up and discuss labs (viewed online). Discussed need for covid boosters (pharmacy)      HPI: Patient is a 78 y.o. male seen in today at [facility] Discussed the use of AI scribe software for clinical note transcription with the patient, who gave verbal consent to proceed.  History of Present Illness Sharod Petsch is a 78 year old male with ischemic cardiomyopathy and coronary artery disease who presents for a three-month follow-up and discussion of lab results.  He has ischemic cardiomyopathy with an ejection fraction of 40%, improved from a previous echocardiogram showing 30-35%. He is managed by a cardiologist and a pulmonologist. Current medications include Coreg  6.25 mg twice daily, valsartan  40 mg daily, Farxiga  10 mg daily, and aspirin  81 mg daily. Spironolactone  was discontinued due to hyperkalemia and worsening renal function. His cholesterol is at goal with an LDL of 47 on Crestor  40 mg daily.   He has a history of coronary artery disease with one stent placed in 2019. He is on aspirin  and Crestor  for management, and his cholesterol levels are at goal.  He has bronchiectasis with slightly worsened breathing over the last 12 months, but overall improvement since starting a new inhaler regimen. He uses hypertonic saline and albuterol  twice daily, along with a flutter valve for airway clearance. Coughing has significantly  reduced compared to a year and a half ago. Occasional cough is present but much less than previously.  He has a history of bladder cancer, which has been clear for three and a half years. He sees a urologist annually for cystoscopy. He is on Flomax  for prostate issues but has not increased the dose due to potential blood pressure concerns. No current issues with constipation, and bowel movements are regular.  He moved to [facility] about a year and a half ago. He is on calcium  and vitamin D  supplements once daily, though he is unsure of the specific reason for this recommendation. 44  Trazodone  is prescribed for sleep, which he has not used in over a year, but keeps for travel purposes.     Review of Systems:  Review of Systems  Constitutional:  Negative for chills, fever and weight loss.  HENT:  Negative for tinnitus.   Respiratory:  Negative for cough, sputum production and shortness of breath.   Cardiovascular:  Negative for chest pain, palpitations and leg swelling.  Gastrointestinal:  Negative for abdominal pain, constipation, diarrhea and heartburn.  Genitourinary:  Negative for dysuria, frequency and urgency.  Musculoskeletal:  Negative for back pain, falls, joint pain and myalgias.  Skin: Negative.   Neurological:  Negative for dizziness and headaches.  Psychiatric/Behavioral:  Negative for depression and memory loss. The patient does not have insomnia.     Past Medical History:  Diagnosis Date   Acute systolic heart failure (HCC)    Anxiety    COPD (chronic obstructive pulmonary disease) (HCC)    Enlarged prostate with lower urinary tract symptoms (LUTS)  GERD (gastroesophageal reflux disease)    Hyperlipidemia    Hypertension    Low serum testosterone  level    PAF (paroxysmal atrial fibrillation) (HCC)    Pneumonia 05/2014   Pyothorax without fistula (HCC)    Sciatica    Shortness of breath dyspnea    Solitary pulmonary nodule    STEMI (ST elevation myocardial  infarction) (HCC)    09/27/17 PCI/DES x1 mLAD, PTCA of the diag branch. EF 30% Lifevest at discharge.    Testicular hypofunction    Urinary frequency    Past Surgical History:  Procedure Laterality Date   APPENDECTOMY     BACK SURGERY     COLONOSCOPY  2011   Tubular adenoma per patient   COLONOSCOPY N/A 03/03/2015   Dr. Harvey: Left colon is redundant, mild diverticulosis, small internal hemorrhoids, moderate external hemorrhoids.  Next colonoscopy in 5 years with overtube.   COLONOSCOPY N/A 03/25/2019   Procedure: COLONOSCOPY;  Surgeon: Harvey Margo CROME, MD;  Location: [facility];  Service: Endoscopy;  Laterality: N/A;  8:30am - Camille's request   CORONARY ULTRASOUND/IVUS N/A 09/27/2017   Procedure: Intravascular Ultrasound/IVUS;  Surgeon: Verlin Lonni BIRCH, MD;  Location: [facility] INVASIVE CV LAB;  Service: Cardiovascular;  Laterality: N/A;   CORONARY/GRAFT ACUTE MI REVASCULARIZATION N/A 09/27/2017   Procedure: Coronary/Graft Acute MI Revascularization;  Surgeon: Verlin Lonni BIRCH, MD;  Location: [facility] INV[facility] CV LAB;  Service: Cardiovascular;  Laterality: N/A;   CYSTOSCOPY W/ RETROGRADES Bilateral 06/16/2020   Procedure: CYSTOSCOPY WITH RETROGRADE PYELOGRAM;  Surgeon: Twylla Glendia BROCKS, MD;  Location: [facility] ORS;  Service: Urology;  Laterality: Bilateral;   EMPYEMA DRAINAGE Right 06/27/2014   Procedure: EMPYEMA DRAINAGE;  Surgeon: Dallas KATHEE Jude, MD;  Location: [facility] OR;  Service: Thoracic;  Laterality: Right;   HEMORRHOID BANDING N/A 03/03/2015   Procedure: GLENICE CAI;  Surgeon: Margo CROME Harvey, MD;  Location: [facility];  Service: Endoscopy;  Laterality: N/A;  Recomended to see a careers adviser.   HERNIA REPAIR     LEFT HEART CATH AND CORONARY ANGIOGRAPHY N/A 09/27/2017   Procedure: LEFT HEART CATH AND CORONARY ANGIOGRAPHY;  Surgeon: Verlin Lonni BIRCH, MD;  Location: [facility];  Service: Cardiovascular;  Laterality: N/A;   LUNG SURGERY  2016   POLYPECTOMY  03/25/2019    Procedure: POLYPECTOMY;  Surgeon: Harvey Margo CROME, MD;  Location: [facility];  Service: Endoscopy;;   TEE WITHOUT CARDIOVERSION N/A 07/14/2014   Procedure: TRANSESOPHAGEAL ECHOCARDIOGRAM (TEE);  Surgeon: Wilbert JONELLE Bihari, MD;  Location: [facility] ENDOSCOPY;  Service: Cardiovascular;  Laterality: N/A;   TRANSURETHRAL RESECTION OF BLADDER TUMOR N/A 06/16/2020   Procedure: TRANSURETHRAL RESECTION OF BLADDER TUMOR (TURBT) WITH GEMCITABINE ;  Surgeon: Twylla Glendia BROCKS, MD;  Location: [facility] ORS;  Service: Urology;  Laterality: N/A;   VARICOSE VEIN SURGERY     VIDEO ASSISTED THORACOSCOPY Right 06/27/2014   Procedure: VIDEO ASSISTED THORACOSCOPY;  Surgeon: Dallas KATHEE Jude, MD;  Location: [facility] OR;  Service: Thoracic;  Laterality: Right;   VIDEO BRONCHOSCOPY N/A 06/27/2014   Procedure: VIDEO BRONCHOSCOPY;  Surgeon: Dallas KATHEE Jude, MD;  Location: [facility] OR;  Service: Thoracic;  Laterality: N/A;   Social History:   reports that he quit smoking about 9 years ago. His smoking use included pipe and cigarettes. He started smoking about 19 years ago. He has a 10 pack-year smoking history. He has been exposed to tobacco smoke. He has never used smokeless tobacco. He reports current alcohol use. He reports that he does not use drugs.  Family History  Problem Relation Age of Onset   Tuberculosis Father    Hypertension Father    COPD Mother    Hypertension Mother    Colon cancer Neg Hx     Medications: Patient's Medications  New Prescriptions   No medications on file  Previous Medications   ACETAMINOPHEN  (TYLENOL ) 500 MG TABLET    Take 500-1,000 mg by mouth as needed for moderate pain (pain score 4-6).   ALBUTEROL  (PROVENTIL ) (2.5 MG/3ML) 0.083% NEBULIZER SOLUTION    Take 3 mLs (2.5 mg total) by nebulization in the morning and at bedtime.   ASPIRIN  EC 81 MG TABLET    Take 1 tablet (81 mg total) by mouth daily.   CALCIUM  CITRATE-VITAMIN D  (CALCIUM  CITRATE + D3 PO)    Take 1 tablet by mouth daily.    CARBOXYMETHYLCELLUL-GLYCERIN (LUBRICATING EYE DROPS OP)    Place 1 drop into both eyes 2 (two) times daily. As needed   CARVEDILOL  (COREG ) 6.25 MG TABLET    Take 1 tablet (6.25 mg total) by mouth 2 (two) times daily with a meal.   FARXIGA  10 MG TABS TABLET    TAKE 1 TABLET(10 MG) BY MOUTH DAILY   MENTHOL , TOPICAL ANALGESIC, (BIOFREEZE EX)    Apply 1 Application topically at bedtime.   MULTIPLE VITAMINS-MINERALS (SENIOR VITES PO)    Take 1 tablet by mouth daily.   NITROGLYCERIN  (NITROSTAT ) 0.4 MG SL TABLET    Place 1 tablet (0.4 mg total) under the tongue every 5 (five) minutes as needed.   ROSUVASTATIN  (CRESTOR ) 40 MG TABLET    Take 1 tablet (40 mg total) by mouth daily.   SODIUM CHLORIDE  HYPERTONIC 3 % NEBULIZER SOLUTION    Take by nebulization in the morning and at bedtime.   TAMSULOSIN  (FLOMAX ) 0.4 MG CAPS CAPSULE    Take 1 capsule (0.4 mg total) by mouth daily.   TRAZODONE  (DESYREL ) 50 MG TABLET    Take 50 mg by mouth at bedtime as needed for sleep.   VALSARTAN  (DIOVAN ) 40 MG TABLET    Take 1 tablet (40 mg total) by mouth daily.  Modified Medications   No medications on file  Discontinued Medications   No medications on file    Physical Exam:  Vitals:   02/06/24 0820  BP: 114/70  Pulse: (!) 58  Temp: 98.6 F (37 C)  SpO2: 98%  Weight: 197 lb 12.8 oz (89.7 kg)  Height: 6' (1.829 m)   Body mass index is 26.83 kg/m. Wt Readings from Last 3 Encounters:  02/06/24 197 lb 12.8 oz (89.7 kg)  01/22/24 199 lb 6.4 oz (90.4 kg)  01/19/24 199 lb (90.3 kg)    Physical Exam Constitutional:      General: He is not in acute distress.    Appearance: He is well-developed. He is not diaphoretic.  HENT:     Head: Normocephalic and atraumatic.     Right Ear: External ear normal.     Left Ear: External ear normal.     Mouth/Throat:     Pharynx: No oropharyngeal exudate.  Eyes:     Conjunctiva/sclera: Conjunctivae normal.     Pupils: Pupils are equal, round, and reactive to light.   Cardiovascular:     Rate and Rhythm: Normal rate and regular rhythm.     Heart sounds: Normal heart sounds.  Pulmonary:     Effort: Pulmonary effort is normal.     Breath sounds: Normal breath sounds.  Abdominal:     General:  Bowel sounds are normal.     Palpations: Abdomen is soft.  Musculoskeletal:        General: No tenderness.     Cervical back: Normal range of motion and neck supple.     Right lower leg: No edema.     Left lower leg: No edema.  Skin:    General: Skin is warm and dry.  Neurological:     Mental Status: He is alert and oriented to person, place, and time.     Labs reviewed: Basic Metabolic Panel: Recent Labs    10/30/23 0734 11/16/23 0815 12/07/23 0738 01/19/24 1029 01/29/24 0722  NA 139   < > 139 145* 141  K 4.7   < > 4.4 4.6 4.8  CL 102   < > 103 104 103  CO2 29   < > 32 24 33*  GLUCOSE 76   < > 92 81 91  BUN 19   < > 31* 29* 36*  CREATININE 0.70   < > 1.50* 1.64* 1.63*  CALCIUM  9.3  9.3   < > 9.6 9.1 9.5  PHOS 3.9  --   --   --   --    < > = values in this interval not displayed.   Liver Function Tests: Recent Labs    10/30/23 0734  AST 24  ALT 24  BILITOT 0.4  PROT 6.9   No results for input(s): LIPASE, AMYLASE in the last 8760 hours. No results for input(s): AMMONIA in the last 8760 hours. CBC: Recent Labs    10/24/23 1118 01/29/24 0722  WBC 7.8 7.0  NEUTROABS  --  4,004  HGB 13.8 15.6  HCT 42.0 46.9  MCV 93.8 93.6  PLT 263 215   Lipid Panel: Recent Labs    10/30/23 0734 01/29/24 0722  CHOL 195 101  HDL 72 40  LDLCALC 108* 47  TRIG 66 65  CHOLHDL 2.7 2.5   TSH: No results for input(s): TSH in the last 8760 hours. A1C: Lab Results  Component Value Date   HGBA1C 5.9 (H) 01/30/2023     Assessment/Plan Assessment and Plan Assessment & Plan Ischemic cardiomyopathy with reduced ejection fraction Ejection fraction at 40% per MRI. Aldactone  discontinued due to hyperkalemia and renal issues. On Coreg ,  valsartan , Farxiga , and aspirin . - Continue Coreg  6.25 mg twice daily. - Continue valsartan  40 mg daily. - Continue Farxiga  10 mg daily. - Continue aspirin  81 mg daily.  Coronary artery disease, status post stent Post-stent placement in 2019. LDL at goal 47 mg/dL on aspirin  and Crestor . - Continue aspirin  81 mg daily. - Continue Crestor  40 mg daily.  Essential hypertension Blood pressure managed with current medications.  - Continue current antihypertensive regimen.  Chronic kidney disease, stage 3b Renal function stable post-diuretic discontinuation. Creatinine levels stable. - Avoid NSAIDs such as Advil, Aleve, and Motrin. - Ensure adequate hydration. - Follow up with blood work in six weeks.  Hyperlipidemia LDL cholesterol at goal 47 mg/dL on Crestor . - Continue Crestor  40 mg daily.  Bronchiectasis Breathing slightly worsened but improved compared to last year. On hypertonic saline, albuterol , flutter valve, and sinus for mucus mobilization. Occasional cough persists. - Continue hypertonic saline twice daily. - Continue albuterol  as needed. - Continue using flutter valve and sinus for mucus mobilization.  Benign prostatic hyperplasia without lower urinary tract symptoms On Flomax . Urologist suggested dosage increase, cautious due to hypotension risk with cardiac medications. - Continue Flomax  as prescribed.  Personal history of malignant neoplasm  of bladder Bladder cancer in remission for three and a half years. Annual cystoscopy performed by urologist. - Continue annual cystoscopy with urologist.  General Health Maintenance COVID vaccination up to date with latest [place] vaccine in September. - Continue routine health maintenance and screenings.   Follow up- 06/11/2024  Harlene [name]. [name] BODILY  [facility] [phone]

## 2024-02-06 NOTE — Patient Instructions (Addendum)
 1.) Visit your local pharmacy if you would like to receive additional covid boosters  2) to come back to [facility] for lab Monday 03/26/2024 at 7:30 am

## 2024-02-08 DIAGNOSIS — I2510 Atherosclerotic heart disease of native coronary artery without angina pectoris: Secondary | ICD-10-CM | POA: Insufficient documentation

## 2024-02-08 DIAGNOSIS — I5020 Unspecified systolic (congestive) heart failure: Secondary | ICD-10-CM | POA: Insufficient documentation

## 2024-03-25 ENCOUNTER — Other Ambulatory Visit: Payer: Self-pay | Admitting: Nurse Practitioner

## 2024-03-25 LAB — BASIC METABOLIC PANEL WITHOUT GFR
BUN/Creatinine Ratio: 20 (calc) (ref 6–22)
BUN: 33 mg/dL — ABNORMAL HIGH (ref 7–25)
CO2: 31 mmol/L (ref 20–32)
Calcium: 9.2 mg/dL (ref 8.6–10.3)
Chloride: 102 mmol/L (ref 98–110)
Creat: 1.65 mg/dL — ABNORMAL HIGH (ref 0.70–1.28)
Glucose, Bld: 91 mg/dL (ref 65–99)
Potassium: 5.1 mmol/L (ref 3.5–5.3)
Sodium: 140 mmol/L (ref 135–146)

## 2024-03-26 ENCOUNTER — Ambulatory Visit: Payer: Self-pay | Admitting: Nurse Practitioner

## 2024-04-08 ENCOUNTER — Telehealth: Payer: Self-pay | Admitting: Family

## 2024-04-08 NOTE — Progress Notes (Unsigned)
 "  Advanced Heart Failure Clinic Note   Referring Physician: Redell Cave, MD PCP: Caro Harlene POUR, NP  Cardiologist: Redell Cave, MD   Chief Complaint: shortness of breath   HPI:  [name] [name] is a 79 y/o male kindly referred by Dr Cave. He has a history of MI w/ stent in 2019, depression, anxiety, bronchiectasis, empyema 2016, HTN, hyperlipidemia, PAF, pulmonary nodule and chronic heart failure. No admissions in the last few years.   TEE 07/17/14: EF 55-60%, mild [name]  LHC 09/27/17: 1. Acute anterior STEMI secondary to occlusion of the mid LAD 2. Successful PTCA/DES x 1 mid LAD 3. Successful PTCA/angioplasty ostium of the Diagonal branch.  4. Mild non-obstructive disease in the RCA, circumflex and mid LAD  Echo 02/22/19: EF 40-45%, moderate LVH, G1DD, normal RV, moderate LAE Echo 11/03/20: EF 40-45%, mild LVH, G1DD, mild LAE, normal RV, mild [name] Echo 07/25/23: EF 30-35%, G1DD, normal RV, mild [name], regional wall motion abnormalities with severe hypokinesis  cMRI 11/01/23: EF 40%, normal RV, no sign of infiltrative disease, findings consistent with ischemic cardiomyopathy, prior LAD infarct  Seen in [facility] 08/25 where spironolactone  12.5mg  daily was started.   He presents today, with his wife, for a HF follow-up visit with a chief complaint of shortness of breath. Sleeping ok. Walking on treadmill at home 30 minutes daily.He is concerned about his renal function worsening since initiation of spironolactone .   Was on entresto  in the past and it made his bronchiectasis worse.   ROS: All systems negative except what is listed in HPI, PMH and Problem List   Past Medical History:  Diagnosis Date   Acute systolic heart failure (HCC)    Anxiety    COPD (chronic obstructive pulmonary disease) (HCC)    Enlarged prostate with lower urinary tract symptoms (LUTS)    GERD (gastroesophageal reflux disease)    Hyperlipidemia    Hypertension    Low serum testosterone  level    PAF  (paroxysmal atrial fibrillation) (HCC)    Pneumonia 05/2014   Pyothorax without fistula (HCC)    Sciatica    Shortness of breath dyspnea    Solitary pulmonary nodule    STEMI (ST elevation myocardial infarction) (HCC)    09/27/17 PCI/DES x1 mLAD, PTCA of the diag branch. EF 30% Lifevest at discharge.    Testicular hypofunction    Urinary frequency     Current Outpatient Medications  Medication Sig Dispense Refill   acetaminophen  (TYLENOL ) 500 MG tablet Take 500-1,000 mg by mouth as needed for moderate pain (pain score 4-6).     albuterol  (PROVENTIL ) (2.5 MG/3ML) 0.083% nebulizer solution Take 3 mLs (2.5 mg total) by nebulization in the morning and at bedtime. 250 mL 11   aspirin  EC 81 MG tablet Take 1 tablet (81 mg total) by mouth daily. 90 tablet 3   Calcium  Citrate-Vitamin D  (CALCIUM  CITRATE + D3 PO) Take 1 tablet by mouth daily.     Carboxymethylcellul-Glycerin (LUBRICATING EYE DROPS OP) Place 1 drop into both eyes 2 (two) times daily. As needed     carvedilol  (COREG ) 6.25 MG tablet Take 1 tablet (6.25 mg total) by mouth 2 (two) times daily with a meal. 180 tablet 3   FARXIGA  10 MG TABS tablet TAKE 1 TABLET(10 MG) BY MOUTH DAILY 90 tablet 3   Menthol , Topical Analgesic, (BIOFREEZE EX) Apply 1 Application topically at bedtime.     Multiple Vitamins-Minerals (SENIOR VITES PO) Take 1 tablet by mouth daily.     nitroGLYCERIN  (NITROSTAT ) 0.4  MG SL tablet Place 1 tablet (0.4 mg total) under the tongue every 5 (five) minutes as needed. 25 tablet 3   rosuvastatin  (CRESTOR ) 40 MG tablet Take 1 tablet (40 mg total) by mouth daily. 90 tablet 3   tamsulosin  (FLOMAX ) 0.4 MG CAPS capsule Take 1 capsule (0.4 mg total) by mouth daily. 90 capsule 1   valsartan  (DIOVAN ) 40 MG tablet Take 1 tablet (40 mg total) by mouth daily. 90 tablet 3   No current facility-administered medications for this visit.    No Known Allergies    Social History   Socioeconomic History   Marital status: Married     Spouse name: Not on file   Number of children: Not on file   Years of education: Not on file   Highest education level: Not on file  Occupational History   Not on file  Tobacco Use   Smoking status: Former    Current packs/day: 0.00    Average packs/day: 1 pack/day for 10.0 years (10.0 ttl pk-yrs)    Types: Pipe, Cigarettes    Start date: 06/06/2004    Quit date: 06/07/2014    Years since quitting: 9.8    Passive exposure: Past   Smokeless tobacco: Never   Tobacco comments:    smoked pipe for 40 years, daily    Quit Cigs at age 20  Vaping Use   Vaping status: Never Used  Substance and Sexual Activity   Alcohol use: Yes    Comment: occasionally   Drug use: No   Sexual activity: Not on file  Other Topics Concern   Not on file  Social History Narrative   Not on file   Social Drivers of Health   Tobacco Use: Medium Risk (02/06/2024)   Patient History    Smoking Tobacco Use: Former    Smokeless Tobacco Use: Never    Passive Exposure: Past  Physicist, Medical Strain: Not on file  Food Insecurity: No Food Insecurity (06/01/2023)   Hunger Vital Sign    Worried About Running Out of Food in the Last Year: Never true    Ran Out of Food in the Last Year: Never true  Transportation Needs: No Transportation Needs (06/01/2023)   PRAPARE - Administrator, Civil Service (Medical): No    Lack of Transportation (Non-Medical): No  Physical Activity: Not on file  Stress: Not on file  Social Connections: Not on file  Intimate Partner Violence: Not At Risk (06/01/2023)   Humiliation, Afraid, Rape, and Kick questionnaire    Fear of Current or Ex-Partner: No    Emotionally Abused: No    Physically Abused: No    Sexually Abused: No  Depression (PHQ2-9): Low Risk (11/03/2023)   Depression (PHQ2-9)    PHQ-2 Score: 0  Alcohol Screen: Not on file  Housing: Unknown (06/01/2023)   Housing Stability Vital Sign    Unable to Pay for Housing in the Last Year: No    Number of Times  Moved in the Last Year: Not on file    Homeless in the Last Year: No  Utilities: Not At Risk (06/01/2023)   AHC Utilities    Threatened with loss of utilities: No  Health Literacy: Not on file      Family History  Problem Relation Age of Onset   Tuberculosis Father    Hypertension Father    COPD Mother    Hypertension Mother    Colon cancer Neg Hx    There were no vitals filed  for this visit.  Wt Readings from Last 3 Encounters:  02/06/24 197 lb 12.8 oz (89.7 kg)  01/22/24 199 lb 6.4 oz (90.4 kg)  01/19/24 199 lb (90.3 kg)   Lab Results  Component Value Date   CREATININE 1.65 (H) 03/25/2024   CREATININE 1.63 (H) 01/29/2024   CREATININE 1.64 (H) 01/19/2024    PHYSICAL EXAM:  General: Well appearing.  Cor: No JVD. Regular rhythm, rate.  Lungs: clear Abdomen: soft, nontender, nondistended. Extremities: no edema Neuro:. Affect pleasant   ECG: not done   ASSESSMENT & PLAN:  1: Ischemic heart failure with reduced ejection fraction- - MI 2019 with PTCA/ DES to mid LAD - NYHA [name] II - euvolemic - weight up 2 pounds from last visit here 1 month ago. Home weight ranges from 190-195 pounds - TEE 07/17/14: EF 55-60%, mild [name] - Echo 02/22/19: EF 40-45%, moderate LVH, G1DD, normal RV, moderate LAE - Echo 11/03/20: EF 40-45%, mild LVH, G1DD, mild LAE, normal RV, mild [name] - Echo 07/25/23: EF 30-35%, G1DD, normal RV, mild [name], regional wall motion abnormalities with severe hypokinesis - cMRI 11/01/23: EF 40%, normal RV, no sign of infiltrative disease, findings consistent with ischemic cardiomyopathy, prior LAD infarct.  - continue carvedilol  6.25mg  BID - continue farxiga  10mg  daily - continue spironolactone  12.5mg  daily. Will repeat BMET in 1 week with PCP and if renal function has not improved, will stop spironolactone . Patient is agreeable to this plan - continue valsartan  40mg  daily (bronchiectasis worse w/ entresto ) - BNP 02/21/22 was 95.9   2: HTN- - BP 146/72 - saw PCP  (Beamer) 05/25 - BMET 12/07/23 reviewed: sodium 139, potassium 4.4, creatinine 1.5. Will message PCP and ask her to recheck BMET at [facility] next week  3: CAD- - saw cardiology (Agbor-Etang) 03/25; returns ~ 09/25 - continue ASA 81mg  daily - LHC 09/27/17:  1. Acute anterior STEMI secondary to occlusion of the mid LAD  2. Successful PTCA/DES x 1 mid LAD  3. Successful PTCA/angioplasty ostium of the Diagonal branch.   4. Mild non-obstructive disease in the RCA, circumflex and mid LAD  4: Hyperlipidemia- - LDL 10/30/23 was 108 - continue rosuvastatin  40mg  daily   Return in 4 months, sooner if needed.   I spent 39 minutes reviewing records, interviewing/ examing patient and managing plan/ orders.    [name] DELENA Class, FNP 04/08/24  "

## 2024-04-08 NOTE — Telephone Encounter (Signed)
 Called to confirm/remind patient of their appointment at the [facility] on 04/09/24.   Appointment:   [] Confirmed  [x] Left mess   [] No answer/No voice mail  [] VM Full/unable to leave message  [] Phone not in service  Patient reminded to bring all medications and/or complete list.  Confirmed patient has transportation. Gave directions, instructed to utilize valet parking.

## 2024-04-09 ENCOUNTER — Ambulatory Visit: Attending: Family | Admitting: Family

## 2024-04-09 ENCOUNTER — Encounter: Payer: Self-pay | Admitting: Family

## 2024-04-09 VITALS — BP 140/67 | HR 60 | Wt 199.2 lb

## 2024-04-09 DIAGNOSIS — I5020 Unspecified systolic (congestive) heart failure: Secondary | ICD-10-CM | POA: Insufficient documentation

## 2024-04-09 DIAGNOSIS — Z87891 Personal history of nicotine dependence: Secondary | ICD-10-CM | POA: Insufficient documentation

## 2024-04-09 DIAGNOSIS — I10 Essential (primary) hypertension: Secondary | ICD-10-CM

## 2024-04-09 DIAGNOSIS — I110 Hypertensive heart disease with heart failure: Secondary | ICD-10-CM | POA: Insufficient documentation

## 2024-04-09 DIAGNOSIS — Z7982 Long term (current) use of aspirin: Secondary | ICD-10-CM | POA: Diagnosis not present

## 2024-04-09 DIAGNOSIS — E785 Hyperlipidemia, unspecified: Secondary | ICD-10-CM | POA: Insufficient documentation

## 2024-04-09 DIAGNOSIS — E782 Mixed hyperlipidemia: Secondary | ICD-10-CM

## 2024-04-09 DIAGNOSIS — R5383 Other fatigue: Secondary | ICD-10-CM | POA: Insufficient documentation

## 2024-04-09 DIAGNOSIS — Z7984 Long term (current) use of oral hypoglycemic drugs: Secondary | ICD-10-CM | POA: Insufficient documentation

## 2024-04-09 DIAGNOSIS — I255 Ischemic cardiomyopathy: Secondary | ICD-10-CM | POA: Diagnosis not present

## 2024-04-09 DIAGNOSIS — Z79899 Other long term (current) drug therapy: Secondary | ICD-10-CM | POA: Diagnosis not present

## 2024-04-09 DIAGNOSIS — I480 Paroxysmal atrial fibrillation: Secondary | ICD-10-CM | POA: Diagnosis not present

## 2024-04-09 DIAGNOSIS — Z955 Presence of coronary angioplasty implant and graft: Secondary | ICD-10-CM | POA: Diagnosis not present

## 2024-04-09 DIAGNOSIS — I2510 Atherosclerotic heart disease of native coronary artery without angina pectoris: Secondary | ICD-10-CM | POA: Insufficient documentation

## 2024-04-09 NOTE — Patient Instructions (Signed)
 It was good to see you today!  If you receive a satisfaction survey regarding the Heart Failure Clinic, please take the time to fill it out. This way we can continue to provide excellent care and make any changes that need to be made.   Call us  in the future if you need us  for anything.

## 2024-04-20 ENCOUNTER — Encounter: Payer: Self-pay | Admitting: Cardiology

## 2024-04-22 MED ORDER — DAPAGLIFLOZIN PROPANEDIOL 10 MG PO TABS: 10.0000 mg | ORAL_TABLET | Freq: Every day | ORAL | 3 refills | Status: AC

## 2024-04-24 ENCOUNTER — Other Ambulatory Visit: Payer: Self-pay

## 2024-04-24 DIAGNOSIS — J479 Bronchiectasis, uncomplicated: Secondary | ICD-10-CM

## 2024-04-24 MED ORDER — ALBUTEROL SULFATE (2.5 MG/3ML) 0.083% IN NEBU: 2.5000 mg | INHALATION_SOLUTION | Freq: Two times a day (BID) | RESPIRATORY_TRACT | 11 refills | Status: AC

## 2024-04-24 MED ORDER — SODIUM CHLORIDE 3 % IN NEBU: INHALATION_SOLUTION | Freq: Two times a day (BID) | RESPIRATORY_TRACT | 11 refills | Status: AC

## 2024-04-24 NOTE — Progress Notes (Signed)
 Received a fax form [facility] asking for a refill on Albuterol  and Sodium Chloride .   Refill has been sent in.   Nothing further needed

## 2024-06-04 ENCOUNTER — Encounter: Payer: Self-pay | Admitting: Nurse Practitioner

## 2024-06-11 ENCOUNTER — Encounter: Admitting: Nurse Practitioner

## 2024-07-09 ENCOUNTER — Ambulatory Visit: Admitting: Nurse Practitioner

## 2024-07-15 ENCOUNTER — Ambulatory Visit: Admitting: Student in an Organized Health Care Education/Training Program

## 2024-08-02 ENCOUNTER — Other Ambulatory Visit: Admitting: Urology
